# Patient Record
Sex: Female | Born: 1955 | Race: Black or African American | Hispanic: No | Smoking: Former smoker
Health system: Southern US, Community
[De-identification: ages and names within clinical notes are randomized; demographics above are authoritative.]

## PROBLEM LIST (undated history)

## (undated) DIAGNOSIS — Z95810 Presence of automatic (implantable) cardiac defibrillator: Secondary | ICD-10-CM

## (undated) DIAGNOSIS — I472 Ventricular tachycardia, unspecified: Secondary | ICD-10-CM

## (undated) DIAGNOSIS — I739 Peripheral vascular disease, unspecified: Secondary | ICD-10-CM

## (undated) DIAGNOSIS — D179 Benign lipomatous neoplasm, unspecified: Secondary | ICD-10-CM

## (undated) DIAGNOSIS — Z950 Presence of cardiac pacemaker: Secondary | ICD-10-CM

## (undated) DIAGNOSIS — I5022 Chronic systolic (congestive) heart failure: Secondary | ICD-10-CM

## (undated) DIAGNOSIS — F1910 Other psychoactive substance abuse, uncomplicated: Secondary | ICD-10-CM

## (undated) DIAGNOSIS — N183 Chronic kidney disease, stage 3 unspecified: Secondary | ICD-10-CM

## (undated) DIAGNOSIS — I447 Left bundle-branch block, unspecified: Secondary | ICD-10-CM

## (undated) DIAGNOSIS — T80219A Unspecified infection due to central venous catheter, initial encounter: Secondary | ICD-10-CM

## (undated) DIAGNOSIS — I10 Essential (primary) hypertension: Secondary | ICD-10-CM

## (undated) DIAGNOSIS — I255 Ischemic cardiomyopathy: Secondary | ICD-10-CM

## (undated) DIAGNOSIS — Z4502 Encounter for adjustment and management of automatic implantable cardiac defibrillator: Secondary | ICD-10-CM

## (undated) DIAGNOSIS — I82409 Acute embolism and thrombosis of unspecified deep veins of unspecified lower extremity: Secondary | ICD-10-CM

## (undated) DIAGNOSIS — I2510 Atherosclerotic heart disease of native coronary artery without angina pectoris: Secondary | ICD-10-CM

## (undated) HISTORY — PX: ANKLE SURGERY: SHX546

## (undated) HISTORY — PX: CARDIAC DEFIBRILLATOR PLACEMENT: SHX171

## (undated) HISTORY — PX: CARDIAC CATHETERIZATION: SHX172

## (undated) HISTORY — PX: ANKLE FRACTURE SURGERY: SHX122

---

## 1997-10-03 ENCOUNTER — Ambulatory Visit (HOSPITAL_COMMUNITY): Admission: RE | Admit: 1997-10-03 | Discharge: 1997-10-03 | Payer: Self-pay | Admitting: *Deleted

## 1998-02-28 ENCOUNTER — Inpatient Hospital Stay (HOSPITAL_COMMUNITY): Admission: AD | Admit: 1998-02-28 | Discharge: 1998-03-04 | Payer: Self-pay | Admitting: *Deleted

## 1998-06-01 ENCOUNTER — Encounter: Payer: Self-pay | Admitting: Emergency Medicine

## 1998-06-01 ENCOUNTER — Inpatient Hospital Stay (HOSPITAL_COMMUNITY): Admission: EM | Admit: 1998-06-01 | Discharge: 1998-06-05 | Payer: Self-pay | Admitting: Emergency Medicine

## 1998-06-01 ENCOUNTER — Encounter: Payer: Self-pay | Admitting: Orthopedic Surgery

## 1998-08-27 ENCOUNTER — Encounter: Admission: RE | Admit: 1998-08-27 | Discharge: 1998-11-25 | Payer: Self-pay | Admitting: Orthopedic Surgery

## 1998-12-25 ENCOUNTER — Ambulatory Visit (HOSPITAL_COMMUNITY): Admission: RE | Admit: 1998-12-25 | Discharge: 1998-12-25 | Payer: Self-pay | Admitting: Orthopedic Surgery

## 1999-08-15 ENCOUNTER — Ambulatory Visit (HOSPITAL_COMMUNITY): Admission: RE | Admit: 1999-08-15 | Discharge: 1999-08-15 | Payer: Self-pay | Admitting: *Deleted

## 1999-08-15 ENCOUNTER — Encounter: Payer: Self-pay | Admitting: *Deleted

## 1999-09-16 ENCOUNTER — Other Ambulatory Visit: Admission: RE | Admit: 1999-09-16 | Discharge: 1999-09-16 | Payer: Self-pay | Admitting: Obstetrics

## 1999-09-16 ENCOUNTER — Encounter (INDEPENDENT_AMBULATORY_CARE_PROVIDER_SITE_OTHER): Payer: Self-pay

## 2005-10-01 HISTORY — PX: CORONARY ARTERY BYPASS GRAFT: SHX141

## 2005-11-12 ENCOUNTER — Encounter: Payer: Self-pay | Admitting: Internal Medicine

## 2005-11-12 ENCOUNTER — Inpatient Hospital Stay (HOSPITAL_COMMUNITY): Admission: EM | Admit: 2005-11-12 | Discharge: 2005-11-25 | Payer: Self-pay | Admitting: Emergency Medicine

## 2005-11-13 ENCOUNTER — Encounter: Payer: Self-pay | Admitting: Internal Medicine

## 2005-11-14 ENCOUNTER — Encounter: Payer: Self-pay | Admitting: Vascular Surgery

## 2005-11-17 ENCOUNTER — Encounter: Payer: Self-pay | Admitting: Internal Medicine

## 2005-11-17 ENCOUNTER — Encounter: Payer: Self-pay | Admitting: Vascular Surgery

## 2005-11-17 ENCOUNTER — Encounter (INDEPENDENT_AMBULATORY_CARE_PROVIDER_SITE_OTHER): Payer: Self-pay | Admitting: *Deleted

## 2005-12-12 ENCOUNTER — Inpatient Hospital Stay (HOSPITAL_COMMUNITY): Admission: EM | Admit: 2005-12-12 | Discharge: 2005-12-17 | Payer: Self-pay | Admitting: Emergency Medicine

## 2005-12-15 ENCOUNTER — Encounter (INDEPENDENT_AMBULATORY_CARE_PROVIDER_SITE_OTHER): Payer: Self-pay | Admitting: *Deleted

## 2006-01-06 ENCOUNTER — Encounter: Admission: RE | Admit: 2006-01-06 | Discharge: 2006-01-06 | Payer: Self-pay | Admitting: Surgery

## 2007-03-02 ENCOUNTER — Encounter (INDEPENDENT_AMBULATORY_CARE_PROVIDER_SITE_OTHER): Payer: Self-pay | Admitting: Cardiology

## 2007-03-02 ENCOUNTER — Ambulatory Visit (HOSPITAL_COMMUNITY): Admission: RE | Admit: 2007-03-02 | Discharge: 2007-03-02 | Payer: Self-pay | Admitting: Cardiology

## 2007-03-02 ENCOUNTER — Encounter: Payer: Self-pay | Admitting: Internal Medicine

## 2007-03-04 DIAGNOSIS — I82409 Acute embolism and thrombosis of unspecified deep veins of unspecified lower extremity: Secondary | ICD-10-CM

## 2007-03-04 HISTORY — DX: Acute embolism and thrombosis of unspecified deep veins of unspecified lower extremity: I82.409

## 2007-09-15 ENCOUNTER — Encounter (INDEPENDENT_AMBULATORY_CARE_PROVIDER_SITE_OTHER): Payer: Self-pay | Admitting: Cardiology

## 2007-09-15 ENCOUNTER — Ambulatory Visit (HOSPITAL_COMMUNITY): Admission: RE | Admit: 2007-09-15 | Discharge: 2007-09-15 | Payer: Self-pay | Admitting: Cardiology

## 2007-11-03 ENCOUNTER — Emergency Department (HOSPITAL_COMMUNITY): Admission: EM | Admit: 2007-11-03 | Discharge: 2007-11-03 | Payer: Self-pay | Admitting: Emergency Medicine

## 2007-11-05 ENCOUNTER — Encounter: Payer: Self-pay | Admitting: Vascular Surgery

## 2007-11-05 ENCOUNTER — Ambulatory Visit: Payer: Self-pay | Admitting: Vascular Surgery

## 2007-11-05 ENCOUNTER — Inpatient Hospital Stay (HOSPITAL_COMMUNITY): Admission: EM | Admit: 2007-11-05 | Discharge: 2007-11-17 | Payer: Self-pay | Admitting: Emergency Medicine

## 2007-11-06 ENCOUNTER — Encounter: Payer: Self-pay | Admitting: Vascular Surgery

## 2007-11-09 ENCOUNTER — Encounter: Payer: Self-pay | Admitting: Vascular Surgery

## 2007-12-01 ENCOUNTER — Ambulatory Visit: Payer: Self-pay | Admitting: Vascular Surgery

## 2007-12-29 ENCOUNTER — Ambulatory Visit: Payer: Self-pay | Admitting: Vascular Surgery

## 2008-01-10 ENCOUNTER — Encounter: Payer: Self-pay | Admitting: Internal Medicine

## 2008-01-24 ENCOUNTER — Ambulatory Visit: Payer: Self-pay | Admitting: Vascular Surgery

## 2008-03-01 ENCOUNTER — Ambulatory Visit: Payer: Self-pay | Admitting: Vascular Surgery

## 2008-07-25 ENCOUNTER — Encounter: Admission: RE | Admit: 2008-07-25 | Discharge: 2008-07-25 | Payer: Self-pay | Admitting: Internal Medicine

## 2008-09-20 ENCOUNTER — Ambulatory Visit: Payer: Self-pay | Admitting: Vascular Surgery

## 2009-04-10 ENCOUNTER — Emergency Department (HOSPITAL_COMMUNITY): Admission: EM | Admit: 2009-04-10 | Discharge: 2009-04-10 | Payer: Self-pay | Admitting: Emergency Medicine

## 2009-09-28 ENCOUNTER — Inpatient Hospital Stay (HOSPITAL_COMMUNITY): Admission: EM | Admit: 2009-09-28 | Discharge: 2009-10-03 | Payer: Self-pay | Admitting: Emergency Medicine

## 2009-09-29 ENCOUNTER — Encounter: Payer: Self-pay | Admitting: Internal Medicine

## 2009-09-29 ENCOUNTER — Encounter (INDEPENDENT_AMBULATORY_CARE_PROVIDER_SITE_OTHER): Payer: Self-pay | Admitting: Cardiovascular Disease

## 2009-09-30 ENCOUNTER — Encounter: Payer: Self-pay | Admitting: Internal Medicine

## 2009-10-01 ENCOUNTER — Encounter (INDEPENDENT_AMBULATORY_CARE_PROVIDER_SITE_OTHER): Payer: Self-pay | Admitting: Emergency Medicine

## 2009-10-01 ENCOUNTER — Ambulatory Visit: Payer: Self-pay | Admitting: Vascular Surgery

## 2009-10-15 ENCOUNTER — Encounter: Payer: Self-pay | Admitting: Internal Medicine

## 2009-11-02 ENCOUNTER — Ambulatory Visit: Payer: Self-pay | Admitting: Internal Medicine

## 2009-11-02 DIAGNOSIS — I959 Hypotension, unspecified: Secondary | ICD-10-CM

## 2009-11-02 LAB — CONVERTED CEMR LAB
Basophils Relative: 0.5 % (ref 0.0–3.0)
CO2: 20 meq/L (ref 19–32)
Chloride: 101 meq/L (ref 96–112)
Eosinophils Absolute: 0 10*3/uL (ref 0.0–0.7)
Hemoglobin: 14.1 g/dL (ref 12.0–15.0)
INR: 1.2 — ABNORMAL HIGH (ref 0.8–1.0)
MCHC: 33 g/dL (ref 30.0–36.0)
MCV: 91.3 fL (ref 78.0–100.0)
Monocytes Absolute: 0.5 10*3/uL (ref 0.1–1.0)
Neutro Abs: 3.8 10*3/uL (ref 1.4–7.7)
RBC: 4.69 M/uL (ref 3.87–5.11)
Sodium: 134 meq/L — ABNORMAL LOW (ref 135–145)

## 2009-11-08 ENCOUNTER — Telehealth: Payer: Self-pay | Admitting: Internal Medicine

## 2009-11-20 ENCOUNTER — Encounter: Payer: Self-pay | Admitting: Internal Medicine

## 2009-11-22 ENCOUNTER — Telehealth: Payer: Self-pay | Admitting: Internal Medicine

## 2009-11-23 ENCOUNTER — Encounter: Payer: Self-pay | Admitting: Internal Medicine

## 2009-11-27 ENCOUNTER — Encounter: Payer: Self-pay | Admitting: Internal Medicine

## 2009-12-10 ENCOUNTER — Ambulatory Visit: Payer: Self-pay

## 2009-12-11 LAB — CONVERTED CEMR LAB
BUN: 32 mg/dL — ABNORMAL HIGH (ref 6–23)
Basophils Relative: 0.6 % (ref 0.0–3.0)
CO2: 25 meq/L (ref 19–32)
Chloride: 104 meq/L (ref 96–112)
Creatinine, Ser: 1.2 mg/dL (ref 0.4–1.2)
Eosinophils Absolute: 0.1 10*3/uL (ref 0.0–0.7)
HCT: 38.5 % (ref 36.0–46.0)
INR: 1.2 — ABNORMAL HIGH (ref 0.8–1.0)
Lymphs Abs: 1 10*3/uL (ref 0.7–4.0)
MCHC: 34.2 g/dL (ref 30.0–36.0)
MCV: 89.1 fL (ref 78.0–100.0)
Monocytes Absolute: 0.5 10*3/uL (ref 0.1–1.0)
Neutrophils Relative %: 71.4 % (ref 43.0–77.0)
RBC: 4.32 M/uL (ref 3.87–5.11)

## 2009-12-12 ENCOUNTER — Observation Stay (HOSPITAL_COMMUNITY): Admission: RE | Admit: 2009-12-12 | Discharge: 2009-12-13 | Payer: Self-pay | Admitting: Internal Medicine

## 2009-12-12 ENCOUNTER — Ambulatory Visit: Payer: Self-pay | Admitting: Internal Medicine

## 2010-01-10 ENCOUNTER — Encounter (HOSPITAL_COMMUNITY)
Admission: RE | Admit: 2010-01-10 | Discharge: 2010-04-02 | Payer: Self-pay | Source: Home / Self Care | Attending: Cardiovascular Disease | Admitting: Cardiovascular Disease

## 2010-01-30 ENCOUNTER — Ambulatory Visit: Payer: Self-pay | Admitting: Vascular Surgery

## 2010-03-24 ENCOUNTER — Encounter: Payer: Self-pay | Admitting: Surgery

## 2010-04-01 ENCOUNTER — Ambulatory Visit: Payer: Self-pay | Admitting: Vascular Surgery

## 2010-04-02 NOTE — Assessment & Plan Note (Signed)
Summary: icd implant/mt   Referring [name]:  DR. Roy[facility] Primary [name]:  Robin Fair, MD  CC:  New patient for evaluation for implanted ICD.   [name]  History of Present Illness: [name] is seen at the request of Dr. [facility] for consideration of ICD implantation.  He is a 55 year old with complex cardiac disease with multiple prior PCI's, CABG in 2007 with a mitral valve repair subsequent intervention in 2009 and known significant AV valvular regurgitation. She was admitted to the [facility] in August with congestive heart failure requiring inotropic support was treated with a Life Vest and plans to consider an ICD.  She also has nonsustained ventricular tachycardia. Her old chart furthermore describes atrial fibrillation with a history of a prior clot thrown to the artery in her leg requiring embolectomy.  She previously took amiodarone and Coumadin; she takes neither at this time.  She is doing much better following her recent hospitalization. She denies orthopnea nocturnal dyspnea edema and she is able to walk more than a couple regards as long as she walks slowly.  She does not have a history of prior syncope. There have been no palpitations that she recalls  Current Medications (verified): 1)  Metoprolol Tartrate 25 Mg Tabs (Metoprolol Tartrate) .... Take One Half Tablet Three Times A Day 2)  Spironolactone 25 Mg Tabs (Spironolactone) .... Take One Tablet Two Times A Day 3)  Furosemide 80 Mg Tabs (Furosemide) .... Take 1/2 Tablet Once Daily 4)  Nitrostat 0.4 Mg Subl (Nitroglycerin) .... Uad 5)  Aspirin 325 Mg Tabs (Aspirin) .... Take One Tablet Once Daily 6)  Isosorbide Mononitrate Cr 30 Mg Xr24h-Tab (Isosorbide Mononitrate) .... Take One Tablet Every Day  Allergies (verified): No Known Drug Allergies  Past History:  Family History: Last updated: 11/02/2009 Family History of Coronary Artery Disease:   Social History: Last updated:  11/02/2009 widowed Children 11, 14 Not smoking x2 months no alcohol or recreational drugs  Past Medical History: Acute right-sided heart failure Acute on chronic systolic heart failure, resolved Nonsustained ventricular tachycardia paroxysmal atrial fibrillation Ischemic cardiomyopathy with ejection fraction by 2-D echo this       admission 20-25% Mild cirrhosis of the liver Coronary artery disease with history of bypass grafting in 2007       along with mitral valve annuloplasty Moderate-to-severe mitral regurgitation Moderate-to-severe tricuspid regurgitation Borderline blood pressure in the 90s to low 100s.  Currently stable       on these readings.  Non-insulin-dependent diabetes mellitus, diet controlled.  Hypertension, controlled.  History of peripheral vascular disease.  History of noncompliance secondary to financial issues.  Tobacco use  Past Surgical History: right popliteal and tibial artery embolectomy-2009 bypass surgery  Family History: Family History of Coronary Artery Disease:   Social History: widowed Children 11, 71 Not smoking x2 months no alcohol or recreational drugs  Review of Systems       full review of systems was negative apart from a history of present illness and past medical history.   Vital Signs:  Patient profile:   55 year old female Height:      64 inches Weight:      157 pounds BMI:     27.05 Pulse rate:   63 / minute Pulse rhythm:   regular BP sitting:   96 / 68  (left arm) Cuff size:   regular  Vitals Entered By: [name] CMA (November 02, 2009 10:54 AM)   Physical Exam  General:  Well developed, well nourished,middle-aged African American female appearing her stated age in no acute distress.She is wearing a life vest Head:  normal HEENT Neck:  supple without thyromegaly carotids diminished but brisk JVP with V waves  9-10 cm Chest Wall:  without kyphosis scoliosis or CVA tenderness Lungs:  clear to  auscultation Heart:  displaced PMI with a diminished S1 no significant murmurs and no S3 was appreciated Abdomen:  soft nontender without hepatomegaly. positive HJR Msk:  normal gait and without obvious arthropathy Pulses:  intact distal pulses Extremities:  no clubbing cyanosis or edema with a scar on the medial aspect of her right lower leg Neurologic:  alert and oriented with grossly normal motor and sensory function Skin:  warm and dry Cervical Nodes:  without adenopathy Psych:  pleasant and engaging affect   EKG  Procedure date:  11/02/2009  Findings:      sinus rh Intervals 0.19/0.12/0.45 Axis is -45 T Wave inversions anterolaterally consistent with ischemia Nonspecific IVCDythm at 63  Impression & Recommendations:  Problem # 1:  CARDIOMYOPATHY, ISCHEMIC S/P CABG (ICD-414.8) the patient has chronic ischemic cardiomyopathy. He has hypotension limiting some of titration of her drugs. She is currently wearing a life vest. She is a reasonable candidate for ICD implantation for primary prevention of sudden cardiac death. I reviewed with her the potential benefits and risks of this procedure.  Problem # 2:  ATRIAL FIBRILLATION (ICD-427.31)  patient has atrial fibrillation I. history. She also has a history of an embolism. I will need to check with Dr. Erlinda Hong as to why is that she is no longer on anticoagulation. Her updated medication list for this problem includes:    Metoprolol Tartrate 25 Mg Tabs (Metoprolol tartrate) .[name]... Take one half tablet three times a day    Aspirin 325 Mg Tabs (Aspirin) .[name]... Take one tablet once daily  Her updated medication list for this problem includes:    Metoprolol Tartrate 25 Mg Tabs (Metoprolol tartrate) .[name]... Take one half tablet three times a day    Aspirin 325 Mg Tabs (Aspirin) .[name]... Take one tablet once daily  Orders: EKG w/ Interpretation (93000)  Problem # 3:  SYSTOLIC HEART FAILURE, CHRONIC (ICD-428.22)  she has significant symptoms.  I will talk with Dr. Erlinda Hong as to whether a CHF consultation might be helpful. Modification of her medical regime may be helpful also; the records are not clear as to why she is not on an ACE inhibitor. Her updated medication list for this problem includes:    Metoprolol Tartrate 25 Mg Tabs (Metoprolol tartrate) .[name]... Take one half tablet three times a day    Spironolactone 25 Mg Tabs (Spironolactone) .[name]... Take one tablet two times a day    Furosemide 80 Mg Tabs (Furosemide) .[name]... Take 1/2 tablet once daily    Nitrostat 0.4 Mg Subl (Nitroglycerin) ..... Uad    Aspirin 325 Mg Tabs (Aspirin) .[name]... Take one tablet once daily    Isosorbide Mononitrate Cr 30 Mg Xr24h-tab (Isosorbide mononitrate) .[name]... Take one tablet every day  Her updated medication list for this problem includes:    Metoprolol Tartrate 25 Mg Tabs (Metoprolol tartrate) .[name]... Take one half tablet three times a day    Spironolactone 25 Mg Tabs (Spironolactone) .[name]... Take one tablet two times a day    Furosemide 80 Mg Tabs (Furosemide) .[name]... Take 1/2 tablet once daily    Nitrostat 0.4 Mg Subl (Nitroglycerin) ..... Uad    Aspirin 325 Mg Tabs (Aspirin) .[name]... Take one tablet  once daily    Isosorbide Mononitrate Cr 30 Mg Xr24h-tab (Isosorbide mononitrate) .[name]... Take one tablet every day  Problem # 4:  HYPOTENSION (ICD-458.9) this has been an issue  Problem # 5:  EMBOLISM AND THROMBOSIS, ARTERY STATUS POST EMBOLIECTOMY (ICD-444.9) as a  Other Orders: TLB-BMP (Basic Metabolic Panel-BMET) (80048-METABOL) TLB-CBC Platelet - w/Differential (85025-CBCD) TLB-PTT (85730-PTTL) TLB-PT (Protime) (85610-PTP)

## 2010-04-02 NOTE — Op Note (Signed)
Summary: [facility]  [facility]   Imported By: Marylou Mccoy 01/22/2010 16:25:40  _____________________________________________________________________  External Attachment:    Type:   Image     Comment:   External Document

## 2010-04-02 NOTE — Cardiovascular Report (Signed)
Summary: Pre-Op Orders  Pre-Op Orders   Imported By: Marylou Mccoy 11/21/2009 11:25:55  _____________________________________________________________________  External Attachment:    Type:   Image     Comment:   External Document

## 2010-04-02 NOTE — Letter (Signed)
Summary: [facility] Office Note   [facility] Office Note   Imported By: Roderic Ovens 12/12/2009 10:13:10  _____________________________________________________________________  External Attachment:    Type:   Image     Comment:   External Document

## 2010-04-02 NOTE — Letter (Signed)
Summary: [facility] Visit   [facility] Visit   Imported By: Roderic Ovens 12/12/2009 10:15:49  _____________________________________________________________________  External Attachment:    Type:   Image     Comment:   External Document

## 2010-04-02 NOTE — Letter (Signed)
Summary: [facility]  [facility]   Imported By: Marylou Mccoy 01/22/2010 16:22:59  _____________________________________________________________________  External Attachment:    Type:   Image     Comment:   External Document

## 2010-04-02 NOTE — Cardiovascular Report (Signed)
Summary: [facility]  [facility]   Imported By: Marylou Mccoy 01/22/2010 16:20:27  _____________________________________________________________________  External Attachment:    Type:   Image     Comment:   External Document

## 2010-04-02 NOTE — Progress Notes (Signed)
Summary: re icd placement date  Phone Note From Other Clinic   Caller: kay [phone] ext [phone]  [facility] Summary of Call: kay calling to see if icd placement has been rs? Initial call taken by: Glynda Jaeger,  November 22, 2009 4:35 PM  Follow-up for Phone Call        don't see where a myoview was done or scheduled. office closed at return number. either I can discuss on Monday or msg nurse can schedule something on Friday. thanks Follow-up by: Claris Gladden RN,  November 22, 2009 7:15 PM  Additional Follow-up for Phone Call Additional follow up Details #1::        called pt   had myoview done. coulld you schedule for our October day thankns steve Additional Follow-up by: Nathen May, MD, [facility],  November 22, 2009 9:08 PM     Appended Document: re icd placement date SCHEDULED FOR 12/12/09 @ 12:00PM. LABS TO BE DRAWN ON 10/10. REVIEWED WITH PT & LETTER SENT.

## 2010-04-02 NOTE — Letter (Signed)
Summary: Implantable Device Instructions  Architectural technologist, Main Office  [address]   [place], [place] [address]   Phone: [phone]  Fax: [phone]      Implantable Device Instructions  You are scheduled for:  _____ Permanent Transvenous Pacemaker __x___ Implantable Cardioverter Defibrillator _____ Implantable Loop Recorder _____ Generator Change  on _9/6/11____ with Dr. __ALLRED___. _ 1.  Please arrive at the Short Stay Center at [facility] at __1:00 PM__ on the day of your procedure.  2.  Do not eat or drink the night before your procedure.  3.  CompleteD lab work on _9/2/11____.  The lab at [facility] is open from 8:30 AM to 1:30 PM and from 2:30 PM to 5:00 PM.  The lab at [facility] is open from 7:30 AM to 5:30 PM.  You do not have to be fasting.  4.  Do NOT take these medications HOLD FUROSEMIDE AFTERNOON OF PROCEDURE.  5.  Plan for an overnight stay.  Bring your insurance cards and a list of your medications.  6.  Wash your chest and neck with antibacterial soap (any brand) the evening before and the morning of your procedure.  Rinse well.  7.  Education material received:     Pacemaker _____           ICD __X___           Arrhythmia _____  *If you have ANY questions after you get home, please call the office [phone].  *Every attempt is made to prevent procedures from being rescheduled.  Due to the nauture of Electrophysiology, rescheduling can happen.  The physician is always aware and directs the staff when this occurs.    Appended Document: Implantable Device Instructions per Dr Juliann Pares ICD implantation for 11/06/09--pt aware

## 2010-04-02 NOTE — Cardiovascular Report (Signed)
Summary: Pre Op Orders   Pre Op Orders   Imported By: Roderic Ovens 12/12/2009 15:58:01  _____________________________________________________________________  External Attachment:    Type:   Image     Comment:   External Document

## 2010-04-02 NOTE — Letter (Signed)
Summary: Implantable Device Instructions  Architectural technologist, Main Office  [address]   [place], [place] [address]   Phone: [phone]  Fax: [phone]      Implantable Device Instructions Rev-1 11/27/09  You are scheduled for:   __x___ Implantable Cardioverter Defibrillator   on December 12, 2009 at 12:00 pm  with Dr. Graciela Husbands.  1.  Please arrive at the Short Stay Center at [facility] at 10:00 am on the day of your procedure.  2.  Do not eat or drink after 6:00 am the morning of your procedure. You can have a clear breakfast before that time which can include: water, broth, Sprite, Ginger Ale, black coffee, tea (no sugar), cranberry/grape/apple juice, jello, popsicle from clear juices. Take your morning pills, except Furosemide, with one of these clear liquids.   3.  Complete lab work on December 10, 2009 at 10:30 am.  The lab at [address] is open from 8:30 AM to 1:30 PM and from 2:30 PM to 5:00 PM.  The lab at [facility] is open from 7:30 AM to 5:30 PM.  You do not have to be fasting.  4.  Do NOT take Furosemide the morning of your procedure.   5.  Plan for an overnight stay.  Bring your insurance cards and a list of your medications.  6.  Wash your chest and neck with antibacterial soap (any brand) the evening before and the morning of your procedure.  Rinse well.   *If you have ANY questions after you get home, please call the office [phone]. Claris Gladden, RN, BSN  *Every attempt is made to prevent procedures from being rescheduled.  Due to the nauture of Electrophysiology, rescheduling can happen.  The physician is always aware and directs the staff when this occurs.

## 2010-04-02 NOTE — Letter (Signed)
Summary: Implantable Device Instructions  Architectural technologist, Main Office  [address]   [place], [place] [address]   Phone: [phone]  Fax: [phone]      Implantable Device Instructions  You are scheduled for:  _____ Permanent Transvenous Pacemaker ___X__ Implantable Cardioverter Defibrillator _____ Implantable Loop Recorder _____ Generator Change  on 12/12/09  with Dr. Graciela Husbands @ 12:00 pm.  1.  Please arrive at the Short Stay Center at [facility] at  10:00 am on the day of your procedure.  2.  Do not eat or drink the night before your procedure.  3.  Complete lab work on Monday  Oct.10th, 2011.  The lab at [facility] is open from 8:30 AM to 1:30 PM and from 2:30 PM to 5:00 PM.  The lab at [facility] is open from 7:30 AM to 5:30 PM.  You do not have to be fasting.  4.  Plan for an overnight stay.  Bring your insurance cards and a list of your medications.  5.  Wash your chest and neck with antibacterial soap (any brand) the evening before and the morning of your procedure.  Rinse well.  6.  Education material received:     Pacemaker _____           ICD _X____           Arrhythmia _____  *If you have ANY questions after you get home, please call the office [phone].  *Every attempt is made to prevent procedures from being rescheduled.  Due to the nauture of Electrophysiology, rescheduling can happen.  The physician is always aware and directs the staff when this occurs.

## 2010-04-02 NOTE — Progress Notes (Signed)
  Phone Note From Other Clinic   Caller: Nurse Call For: nurse/dr Tarvis Blossom Summary of Call: DR CROITORU 'S OFFICE CALLED RE PT DEVICE IMPLANT BEING CX  PER DR Graciela Husbands PT NEEDS MYOVIEW PRIOR TO PROCEDURE TO CHECK ON ISCHEMIA DR CROITORU'S OFF TO SCHEDULE. Initial call taken by: Scherrie Bateman, LPN,  November 08, 2009 11:45 AM  Follow-up for Phone Call        thnsk Follow-up by: Nathen May, MD, [facility],  November 19, 2009 5:40 PM

## 2010-04-02 NOTE — Cardiovascular Report (Signed)
Summary: [facility]  [facility]   Imported By: Marylou Mccoy 01/22/2010 16:22:14  _____________________________________________________________________  External Attachment:    Type:   Image     Comment:   External Document

## 2010-05-16 LAB — CBC
HCT: 35.6 % — ABNORMAL LOW (ref 36.0–46.0)
Hemoglobin: 12 g/dL (ref 12.0–15.0)
MCH: 29.7 pg (ref 26.0–34.0)
MCV: 88.1 fL (ref 78.0–100.0)
RBC: 4.04 MIL/uL (ref 3.87–5.11)
WBC: 5.5 10*3/uL (ref 4.0–10.5)

## 2010-05-16 LAB — BASIC METABOLIC PANEL
CO2: 27 mEq/L (ref 19–32)
Chloride: 103 mEq/L (ref 96–112)
Creatinine, Ser: 1.26 mg/dL — ABNORMAL HIGH (ref 0.4–1.2)
GFR calc Af Amer: 54 mL/min — ABNORMAL LOW (ref >60)
Potassium: 3.8 mEq/L (ref 3.5–5.1)
Sodium: 139 mEq/L (ref 135–145)

## 2010-05-16 LAB — GLUCOSE, CAPILLARY: Glucose-Capillary: 149 mg/dL — ABNORMAL HIGH (ref 70–99)

## 2010-05-16 LAB — APTT: aPTT: 32 seconds (ref 24–37)

## 2010-05-16 LAB — SURGICAL PCR SCREEN
MRSA, PCR: NEGATIVE
Staphylococcus aureus: NEGATIVE

## 2010-05-17 LAB — GLUCOSE, CAPILLARY
Glucose-Capillary: 104 mg/dL — ABNORMAL HIGH (ref 70–99)
Glucose-Capillary: 105 mg/dL — ABNORMAL HIGH (ref 70–99)
Glucose-Capillary: 108 mg/dL — ABNORMAL HIGH (ref 70–99)
Glucose-Capillary: 114 mg/dL — ABNORMAL HIGH (ref 70–99)
Glucose-Capillary: 119 mg/dL — ABNORMAL HIGH (ref 70–99)
Glucose-Capillary: 133 mg/dL — ABNORMAL HIGH (ref 70–99)
Glucose-Capillary: 78 mg/dL (ref 70–99)
Glucose-Capillary: 92 mg/dL (ref 70–99)

## 2010-05-17 LAB — COMPREHENSIVE METABOLIC PANEL
ALT: 19 U/L (ref 0–35)
Albumin: 3.8 g/dL (ref 3.5–5.2)
Alkaline Phosphatase: 350 U/L — ABNORMAL HIGH (ref 39–117)
BUN: 21 mg/dL (ref 6–23)
Calcium: 9.4 mg/dL (ref 8.4–10.5)
Glucose, Bld: 93 mg/dL (ref 70–99)
Potassium: 3.6 mEq/L (ref 3.5–5.1)
Sodium: 136 mEq/L (ref 135–145)
Total Protein: 7.8 g/dL (ref 6.0–8.3)

## 2010-05-17 LAB — BASIC METABOLIC PANEL
CO2: 28 mEq/L (ref 19–32)
Calcium: 10 mg/dL (ref 8.4–10.5)
Calcium: 9.7 mg/dL (ref 8.4–10.5)
Chloride: 100 mEq/L (ref 96–112)
Chloride: 100 mEq/L (ref 96–112)
Creatinine, Ser: 1.21 mg/dL — ABNORMAL HIGH (ref 0.4–1.2)
GFR calc Af Amer: 46 mL/min — ABNORMAL LOW (ref >60)
GFR calc Af Amer: 57 mL/min — ABNORMAL LOW (ref >60)
GFR calc non Af Amer: 47 mL/min — ABNORMAL LOW (ref >60)
Glucose, Bld: 98 mg/dL (ref 70–99)
Potassium: 4.2 mEq/L (ref 3.5–5.1)
Sodium: 137 mEq/L (ref 135–145)

## 2010-05-17 LAB — BRAIN NATRIURETIC PEPTIDE: Pro B Natriuretic peptide (BNP): 757 pg/mL — ABNORMAL HIGH (ref 0.0–100.0)

## 2010-05-17 LAB — MAGNESIUM: Magnesium: 2.1 mg/dL (ref 1.5–2.5)

## 2010-05-17 LAB — CBC
MCHC: 33.4 g/dL (ref 30.0–36.0)
Platelets: 158 10*3/uL (ref 150–400)
RDW: 18.3 % — ABNORMAL HIGH (ref 11.5–15.5)
WBC: 4.5 10*3/uL (ref 4.0–10.5)

## 2010-05-18 LAB — URINALYSIS, ROUTINE W REFLEX MICROSCOPIC
Hgb urine dipstick: NEGATIVE
Ketones, ur: NEGATIVE mg/dL
Nitrite: NEGATIVE
Protein, ur: 100 mg/dL — AB
Urobilinogen, UA: 1 mg/dL (ref 0.0–1.0)

## 2010-05-18 LAB — COMPREHENSIVE METABOLIC PANEL
AST: 31 U/L (ref 0–37)
Albumin: 3.7 g/dL (ref 3.5–5.2)
Alkaline Phosphatase: 333 U/L — ABNORMAL HIGH (ref 39–117)
BUN: 13 mg/dL (ref 6–23)
CO2: 20 mEq/L (ref 19–32)
Chloride: 106 mEq/L (ref 96–112)
Creatinine, Ser: 1.35 mg/dL — ABNORMAL HIGH (ref 0.4–1.2)
GFR calc non Af Amer: 41 mL/min — ABNORMAL LOW (ref >60)
Potassium: 4 mEq/L (ref 3.5–5.1)
Total Bilirubin: 1.7 mg/dL — ABNORMAL HIGH (ref 0.3–1.2)

## 2010-05-18 LAB — GLUCOSE, CAPILLARY
Glucose-Capillary: 104 mg/dL — ABNORMAL HIGH (ref 70–99)
Glucose-Capillary: 136 mg/dL — ABNORMAL HIGH (ref 70–99)
Glucose-Capillary: 148 mg/dL — ABNORMAL HIGH (ref 70–99)
Glucose-Capillary: 179 mg/dL — ABNORMAL HIGH (ref 70–99)

## 2010-05-18 LAB — CK TOTAL AND CKMB (NOT AT ARMC)
CK, MB: 1.2 ng/mL (ref 0.3–4.0)
Relative Index: INVALID (ref 0.0–2.5)
Total CK: 68 U/L (ref 7–177)

## 2010-05-18 LAB — HEMOGLOBIN A1C
Hgb A1c MFr Bld: 6.1 % — ABNORMAL HIGH (ref <5.7)
Mean Plasma Glucose: 128 mg/dL — ABNORMAL HIGH (ref <117)

## 2010-05-18 LAB — CARDIAC PANEL(CRET KIN+CKTOT+MB+TROPI)
CK, MB: 0.9 ng/mL (ref 0.3–4.0)
CK, MB: 0.9 ng/mL (ref 0.3–4.0)
Relative Index: INVALID (ref 0.0–2.5)
Relative Index: INVALID (ref 0.0–2.5)
Total CK: 62 U/L (ref 7–177)
Troponin I: 0.04 ng/mL (ref 0.00–0.06)
Troponin I: 0.05 ng/mL (ref 0.00–0.06)

## 2010-05-18 LAB — DIFFERENTIAL
Basophils Absolute: 0 10*3/uL (ref 0.0–0.1)
Basophils Relative: 0 % (ref 0–1)
Eosinophils Relative: 1 % (ref 0–5)
Monocytes Absolute: 0.4 10*3/uL (ref 0.1–1.0)

## 2010-05-18 LAB — CBC
Hemoglobin: 15.3 g/dL — ABNORMAL HIGH (ref 12.0–15.0)
MCH: 31 pg (ref 26.0–34.0)
MCV: 92.3 fL (ref 78.0–100.0)
Platelets: 159 10*3/uL (ref 150–400)
Platelets: 172 10*3/uL (ref 150–400)
RBC: 4.94 MIL/uL (ref 3.87–5.11)
RDW: 18 % — ABNORMAL HIGH (ref 11.5–15.5)
WBC: 4.6 10*3/uL (ref 4.0–10.5)
WBC: 5.5 10*3/uL (ref 4.0–10.5)

## 2010-05-18 LAB — BASIC METABOLIC PANEL
BUN: 12 mg/dL (ref 6–23)
BUN: 17 mg/dL (ref 6–23)
CO2: 29 mEq/L (ref 19–32)
Chloride: 107 mEq/L (ref 96–112)
GFR calc Af Amer: >60 mL/min (ref >60)
GFR calc non Af Amer: 43 mL/min — ABNORMAL LOW (ref >60)
Glucose, Bld: 95 mg/dL (ref 70–99)
Potassium: 3.5 mEq/L (ref 3.5–5.1)
Potassium: 3.9 mEq/L (ref 3.5–5.1)
Sodium: 140 mEq/L (ref 135–145)

## 2010-05-18 LAB — PROTIME-INR
INR: 1.51 — ABNORMAL HIGH (ref 0.00–1.49)
Prothrombin Time: 18.1 seconds — ABNORMAL HIGH (ref 11.6–15.2)

## 2010-05-18 LAB — MAGNESIUM: Magnesium: 2 mg/dL (ref 1.5–2.5)

## 2010-05-18 LAB — LIPASE, BLOOD: Lipase: 54 U/L (ref 11–59)

## 2010-05-18 LAB — TROPONIN I: Troponin I: 0.04 ng/mL (ref 0.00–0.06)

## 2010-05-18 LAB — LIPID PANEL
Cholesterol: 150 mg/dL (ref 0–200)
LDL Cholesterol: 100 mg/dL — ABNORMAL HIGH (ref 0–99)

## 2010-05-18 LAB — HEPATIC FUNCTION PANEL
AST: 34 U/L (ref 0–37)
Albumin: 3.6 g/dL (ref 3.5–5.2)
Total Protein: 7.2 g/dL (ref 6.0–8.3)

## 2010-05-18 LAB — HEPATITIS PANEL, ACUTE: Hep A IgM: NEGATIVE

## 2010-05-18 LAB — AFP TUMOR MARKER: AFP-Tumor Marker: 2 ng/mL (ref 0.0–8.0)

## 2010-05-18 LAB — URINE MICROSCOPIC-ADD ON

## 2010-05-22 LAB — URINALYSIS, ROUTINE W REFLEX MICROSCOPIC
Ketones, ur: 15 mg/dL — AB
Nitrite: NEGATIVE
Protein, ur: 100 mg/dL — AB
Urobilinogen, UA: 1 mg/dL (ref 0.0–1.0)
pH: 5.5 (ref 5.0–8.0)

## 2010-05-22 LAB — BASIC METABOLIC PANEL
BUN: 19 mg/dL (ref 6–23)
CO2: 20 mEq/L (ref 19–32)
Chloride: 105 mEq/L (ref 96–112)
Creatinine, Ser: 1.37 mg/dL — ABNORMAL HIGH (ref 0.4–1.2)
Glucose, Bld: 120 mg/dL — ABNORMAL HIGH (ref 70–99)
Potassium: 4.3 mEq/L (ref 3.5–5.1)

## 2010-05-22 LAB — CBC
HCT: 40.9 % (ref 36.0–46.0)
MCHC: 33.2 g/dL (ref 30.0–36.0)
MCV: 90.1 fL (ref 78.0–100.0)
Platelets: 157 10*3/uL (ref 150–400)
RDW: 18.1 % — ABNORMAL HIGH (ref 11.5–15.5)
WBC: 4.4 10*3/uL (ref 4.0–10.5)

## 2010-05-22 LAB — URINE MICROSCOPIC-ADD ON

## 2010-05-22 LAB — DIFFERENTIAL
Basophils Absolute: 0 10*3/uL (ref 0.0–0.1)
Lymphocytes Relative: 16 % (ref 12–46)
Monocytes Absolute: 0.4 10*3/uL (ref 0.1–1.0)
Neutro Abs: 3.3 10*3/uL (ref 1.7–7.7)
Neutrophils Relative %: 75 % (ref 43–77)

## 2010-07-16 NOTE — Assessment & Plan Note (Signed)
OFFICE VISIT   [name], [name]  DOB:  1955/10/28                                       12/29/2007  [id number]#:[id number]   The patient returns for followup today of her fasciotomy wounds.  She  previously underwent right popliteal and tibial artery embolectomy on  September 4.   PHYSICAL EXAMINATION:  On physical exam today she has a 2+ dorsalis  pedis pulse in the right foot.  The lateral fasciotomy wound has some  hypergranulation tissue but overall is decreasing in size.  She had a  small amount of hypergranulation tissue on the medial aspect but this is  essentially healed.   She will continue to have hydrogel dressings on the lateral fasciotomy  site.  She will return for followup in one month's time.   Janetta Hora. [name], MD  Electronically Signed   CEF/MEDQ  D:  12/30/2007  T:  12/30/2007  Job:  [id number]

## 2010-07-16 NOTE — Assessment & Plan Note (Signed)
OFFICE VISIT   [name], [name]  DOB:  1956-02-20                                       03/01/2008  [id number]#:[id number]   The patient returns for follow-up today after right popliteal and tibial  artery embolectomy on September 4th.  On exam today, she is a 2+  dorsalis pedis pulse in the right foot.  The lateral fasciotomy wound is  now completely healed.  The medial leg wound is also well-healed.  Overall, she is doing well.  She continues to take her Coumadin and  Plavix.  She has close follow-up with Dr. Jacinto Halim in the near future.  She  will follow up with me in 6 months' time for repeat ABIs.   Janetta Hora. [name], MD  Electronically Signed   CEF/MEDQ  D:  03/01/2008  T:  03/02/2008  Job:  [id number]   cc:   Cristy Hilts. Jacinto Halim, MD

## 2010-07-16 NOTE — Discharge Summary (Signed)
[name], [name]              ACCOUNT NO.:  [id number]   MEDICAL RECORD NO.:  [id number]          PATIENT TYPE:  INP   LOCATION:  2008                         FACILITY:  [facility]   PHYSICIAN:  Janetta Hora. [name], MD  DATE OF BIRTH:  01-02-1956   DATE OF ADMISSION:  11/05/2007  DATE OF DISCHARGE:  11/17/2007                               DISCHARGE SUMMARY   The patient is of Dr. Darrick Penna.   DISCHARGE DIAGNOSES:  1. Right lower extremity ischemia, acute.  2. Atrial fibrillation.  3. Congestive heart failure.  4. Diabetes.  5. Hypertension.  6. Dyslipidemia.   PROCEDURE PERFORMED:  1. On November 05, 2007, right popliteal and tibial artery      embolectomy.  2. Four compartment fasciotomy.  3. Intraoperative arteriogram by Dr. Darrick Penna.   COMPLICATIONS:  None.   CONDITION ON DISCHARGE:  Stable and improving.   DISPOSITION:  She is being discharged home in stable condition with her  wounds healing well.  She does have a wound VAC in place and  arrangements have been arranged for home health followup.  She is also  discharged on Coumadin and arrangements have been made for followup with  Dr. Jacinto Halim of [facility].   DISPOSITION:  She is being discharged to home in stable condition.  Her  wounds are healing well.  She is given careful instructions regarding  the care of her wounds.  As previously stated, home health nursing is  arranged for followup with wound VAC and blood draws with Dr. Jacinto Halim.  She is given the following appointments, Dr. Darrick Penna 1-2 weeks, Dr. Jacinto Halim  in 1 week.  She is to call Dr. Verl Dicker office to schedule an  appointment.  Our office will schedule an appointment with Dr. Darrick Penna.  Brief identifying statement with complete details, please refer the  typed history and physical.   Briefly, this is a very pleasant 55 year old woman presented to the  emergency room with a painful right lower extremity.  Dr. Darrick Penna  evaluated her and found her to have a right lower  extremity ischemia.  This was felt due to an embolic phenomenon and he recommended  embolectomy and fasciotomy if needed.  She was informed of the risks and  benefits of the procedure and after careful consideration elected to  proceed with surgery.   HOSPITAL COURSE:  Preoperative workup was completed on an urgent basis.  She was taken to the operating room and underwent the aforementioned  embolectomy.  For complete details, please refer to the typed operative  report.  The procedure was without complications.  She was returned to  the post-anesthesia care unit extubated.  We were able to transfer her  to a bed on a surgical convalescent floor.  She was mobilized.  A wound  VAC was placed.  She developed congestive heart failure postoperatively.  We asked Dr. Jacinto Halim to evaluate this.  We do appreciate his diligent  assistance.  Over the course of several days, her congestive heart  failure improved.  The wound was forming granulation tissue.  She was  felt stable.  She was desirous of  discharge on November 17, 2007, and  was subsequently discharged home.  At discharge, her PT/INR was 27.5 and  2.4.   DISCHARGE MEDICATIONS:  1. Coreg 3.125 mg p.o. b.i.d.  2. Lipitor 40 mg p.o. daily.  3. Plavix 75 mg p.o. daily.  4. Metformin 500 mg p.o. b.i.d.  5. Lisinopril/hydrochlorothiazide 10/12.5 mg tablets one p.o. daily.  6. Darvocet-N 100 one p.o. q.6 h. p.r.n. pain.  7. Amiodarone 200 mg p.o. daily.  8. Digoxin 0.125 mg p.o. daily.  9. Aldactone 25 mg one-half tablet p.o. b.i.d.  10.Lasix 40 mg p.o. b.i.d.  11.K-Dur 20 mEq p.o. daily.  12.Oxycodone 5 mg 1-2 p.o. q.4 h. p.r.n. pain.  13.She was given a total of 30 tablets of Percocet.      Wilmon Arms, PA      Janetta Hora. [name], MD  Electronically Signed    KEL/MEDQ  D:  11/17/2007  T:  11/17/2007  Job:  [id number]

## 2010-07-16 NOTE — Op Note (Signed)
[name], [name]              ACCOUNT NO.:  [id number]   MEDICAL RECORD NO.:  [id number]          PATIENT TYPE:  INP   LOCATION:  2314                         FACILITY:  [facility]   PHYSICIAN:  Janetta Hora. [name], MD  DATE OF BIRTH:  1955/06/13   DATE OF PROCEDURE:  11/05/2007  DATE OF DISCHARGE:                               OPERATIVE REPORT   PROCEDURES:  1. Right popliteal and tibial artery embolectomy.  2. Four compartment fasciotomy.  3. Intraoperative arteriogram x1.   PREOPERATIVE DIAGNOSIS:  Ischemia, right lower extremity.   POSTOPERATIVE DIAGNOSIS:  Ischemia, right lower extremity.   ANESTHESIA:  General.   ASSISTANT:  Jerold Coombe, PA-C   OPERATIVE FINDINGS:  1. Acute embolus, right popliteal and tibial arteries.  2. Intraoperative arteriogram with 3-vessel runoff at the end of the      case.   OPERATIVE DETAILS:  After obtaining informed consent, the patient was  taken to the operating room.  The patient was placed in supine position  on the operating room table.  After induction of general anesthesia and  endotracheal intubation, a Foley catheter was placed.  Next, the  patient's entire right lower extremity was prepped and draped in usual  sterile fashion.  A longitudinal incision was made on the medial aspect  of the right leg below the knee.  Incision was carried down through  subcutaneous tissues down to the level of the fascia.  The fascia was  opened with cautery.  Popliteal space was entered.  Popliteal artery was  dissected free circumferentially.  There was visible thrombus within the  artery.  Dissection was carried down to the level of the takeoff of the  anterior tibial artery and tibioperoneal trunks.  Anterior tibial vein  was divided and ligated between silk ties.  Anterior tibial artery was  dissected free circumferentially and a vessel loop was placed around  this.  A vessel loop was also placed around the proximal tibioperoneal  trunk.   The patient was given 7000 units of intravenous heparin.  A  transverse arteriotomy was made in the popliteal artery just above the  takeoff of the tibial vessels.  There was thrombus visible within the  artery.  A #4-Fogarty catheter was used to thrombectomize the popliteal  artery and multiple passes were made until all thrombotic material was  removed.  There was excellent arterial inflow.  This was then occluded  with a vessel loop.  Next, #2, #3, and #4 Fogarty catheters were passed  down the tibial arteries with return of a large amount of thrombus and  there was very brisk backbleeding from all of these at this point.  Catheters were passed down, 2 clean passes were made in each vessel.  These were then controlled with fine bulldog clamps distally.  Everything was thoroughly irrigated heparinized saline.  The arteriotomy  was then reapproximated using interrupted 7-0 Prolene sutures.  Just  prior to completion of the anastomosis, this was forebled, backbled, and  thoroughly flushed.  Anastomosis was secured.  Clamps were released.  There is pulsatile flow in the popliteal artery and Doppler  signals in  the anterior tibial and tibioperoneal trunk immediately.  The patient  also had re-establishment of a dorsalis pedis and posterior tibial  Doppler signal.  Intraoperative arteriogram was then performed using  inflow occlusion.  This showed a patent popliteal artery with 3-vessel  runoff via the anterior tibial peroneal and posterior tibial arteries.  There was some irregularity of the mid section of the tibioperoneal  trunk and this was thought to be primarily due to spasm.  There was also  some diffuse spasm distally down towards the foot.  All of these  vessels, however were patent.   Next, the medial leg incision was extended, so that the posterior and  posterior deep compartments were fully decompressed.  An anterior and  lateral compartment fasciotomy was then performed to a  longitudinal  incision on the lateral aspect of the right leg.  The patient tolerated  the procedure well and there were no complications.  Instrument, sponge,  and needle counts were correct at the end of the case.  The patient was  taken to recovery room in stable condition.      Janetta Hora. [name], MD  Electronically Signed     CEF/MEDQ  D:  11/05/2007  T:  11/06/2007  Job:  [id number]

## 2010-07-16 NOTE — Assessment & Plan Note (Signed)
OFFICE VISIT   NYARA, CAPELL  DOB:  03-27-1955                                       12/01/2007  EAVWU#:[id number]   The patient returns for followup today.  She underwent right popliteal  and tibial artery embolectomy on September 4.  She also required  fasciotomies at that time.  She was placed on Coumadin on discharge.  She has a history of low ejection fraction and is followed for her  cardiology issues by Dr. Jacinto Halim.  She presents for further followup  today.   PHYSICAL EXAMINATION:  On exam today the medial and lateral fasciotomy  wounds are granulating well and these are clean.  She has a VAC at home  and will have this reapplied tomorrow.  She has a 2+ dorsalis pedis and  posterior tibial pulse in the right foot.  She complains that she still  has some numbness, tingling, pins and needles especially on the front  and dorsal aspect of her foot.  She is currently taking Lortab for pain.   In summary, the patient has recovered from her embolectomy.  She has a  well-perfused right foot.  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  I discussed with her today these may not  recover but hopefully over time she will get some sensation back.  Since  she is having a fair amount of neuropathic pain I have started her on  Neurontin 100 mg three times a day today.  I also gave her a renewal of  oxycodone prescription 5 mg #40 dispensed.  She will follow up with me  in 1 month's time to recheck her wounds and also to see if her Neurontin  dose may need to be titrated.  She will continue to take her Coumadin as  directed by Dr. Jacinto Halim.  She will also continue to refrain from use of  crack cocaine.   Janetta Hora. [name], MD  Electronically Signed   CEF/MEDQ  D:  12/01/2007  T:  12/02/2007  Job:  [id number]   cc:   Cristy Hilts. Jacinto Halim, MD

## 2010-07-19 NOTE — Cardiovascular Report (Signed)
NAMEABBEGAIL, MATUSKA NO.:  [id number]   MEDICAL RECORD NO.:  [id number]          PATIENT TYPE:  INP   LOCATION:  2313                         FACILITY:  [facility]   PHYSICIAN:  Nicki Guadalajara, M.D.     DATE OF BIRTH:  1956/02/27   DATE OF PROCEDURE:  11/13/2005  DATE OF DISCHARGE:                              CARDIAC CATHETERIZATION   INDICATIONS:  [name] is a 55 year old African-American female  with a history of cocaine use who presented to [facility] yesterday  morning, November 12, 2005 with an acute anterior wall myocardial  infarction associated with cocaine use.  She underwent emergent cardiac  catheterization and coronary intervention by Dr. Jacinto Halim.  At that time, he  found a total LAD occlusion with high-grade disease in the ostium of the  ramus intermediate vessel, subtotal total circumflex as well as RCA with  some collaterals.  He initially attempted intervention to the LAD system  with PTCA but there was concern of significant thrombus burden and  dissection for which reason he elected to place a 3.0 x 32 mm Liberte stent.  He did have TIMI I flow being improved to TIMI III flow.  Apparently last  evening, the patient's Integrilin was discontinued.  His thoughts ultimately  was that the patient may require additional PCI or possible CABG surgery.   This morning at approximately 6:50 to 7 a.m., the patient developed  recurrent chest tightness, pressure associated with ST-segment elevation in  V3-V6 highly suggestive of reocclusion versus resulting from the ostial  intermediate stenosis.  The patient was titrated on nitroglycerin.  Heparin  was started.  She was given morphine sulfate and transported to [facility] for emergent repeat catheterization.  Initially,  she was taken to Lab 3.  She was prepped and draped.  The catheterization  was performed by obtaining groin access successfully and without  difficulty.  However, at this point there was a fluoroscopy failure.  The entire system  was then shut down and rebooted.  After a moderate amount of waiting time,  again this system did not work.  Another reboot attempt was made.  Despite  waiting for again sometime for this to be successful, it was felt that this  was not going to be successful and consequently another room was opened and  the patient was transported to Lab 5 for the acute catheterization.  This  obviously led to a time delay in establishment of reperfusion time due to  lab fluoroscopy failure.  Cardiac catheterization was then performed  utilizing 6-French Judkins #4 left and right diagnostic catheters.  With the  demonstration of total occlusion of the proximal LAD at the stented segment,  decision was made to attempt intervention.  The patient was maintained on  her heparin drip and was given an additional bolus of Integrilin and started  on heparin drip.  Asahi medium wire was advanced to the stent but seemed to  hang up in the mid-stented segment where there was concern of probable stent  malapposition and suboptimal apposition to the wall leading to the  occlusion.  A 2.0 x 15 mm Maverick balloon was then used to help navigate  the wire to make certain that the wire was not going underneath the struts  and was staying intraluminal.  Ultimately, the stented segment was crossed  and the wire was advanced to the more distal LAD segment.  Dilatation was  done throughout the stented segment with this 2.0 balloon with restoration  of TIMI III flow.  It also became apparent that the LAD at the apex was  totally occluded as well.  A 3.25 x 20 mm Quantum balloon was then used for  more optimal dilation within the stented segment which had been a 3.0 x 32  mm Liberte nondrug-eluting stent.  Multiple dilatations were made throughout  the entire stented segment up to 3.25 to 3.29 mm.  This again resulted in  excellent  angiographic result.  It still became apparent that the apical LAD  was occluded.  The wire was able to cross the apical LAD.  The 2.0 Maverick  balloon was then reinserted and predilatation was done at the apex up to 4  atmospheres times several inflations with resulting opening of the 100%  occlusion to less than 0% and with again the LAD now wrapping around the  apex also supplying some of the distal RCA collaterals.  Total perfusion  time was approximately 1 hour and 45 minutes which was due to several delays  as noted above.  She became painfree, left the laboratory with TIMI III  flow, feeling well with stable hemodynamics.   HEMODYNAMIC DATA:  Central aortic pressure is 130/86, mean 105.  During the  procedure, she did receive several doses of intracoronary nitroglycerin.  She also had received Versed 2 mg, morphine sulfate as well as Integrilin  and heparin.  ACT was documented to be therapeutic during the procedure.   ANGIOGRAPHIC DATA:  The left main coronary artery was a long vessel which  trifurcated into an LAD, a ramus intermediate vessel and left circumflex  system.   The LAD ostium had narrowing of approximately 50% and then was 20-30%  narrowing before the first diagonal takeoff.  The LAD was totally occluded  after the large septal perforating artery at the stented segment.  There was  TIMI 0 flow.   The ramus intermediate vessel had 95% ostial stenosis followed by 90%  proximal stenosis.   The circumflex vessel was 99% stenosed.  There was filling of the OM1 and  OM2 vessel slowly.  There was collateralization to the distal RCA from the  left coronary circulation.   Injection in the right coronary artery revealed this to be subtotal/totally  occluded proximally.  Again, there was some collateralization distally.   Following successful PTCA of the LAD system, initially with a 200 Maverick  balloon and ultimately a 3.25 x 20 mm Quantum balloon postdilated to  3.29 mm, the entire stented segment was reduced to 0% with brisk TIMI III flow in  the periods of complete stent apposition.  In addition, the apical LAD  occlusion of 100% was opened to 0% with TIMI III flow done with PTCA  utilizing the 2.0 x 15 mm Maverick balloon.  Cine grams were also reviewed  with Dr. Jacinto Halim who was the initial primary operator for the acute event.  The patient left the laboratory in stable condition.   IMPRESSION:  1. Acute thrombosis/occlusion of the left anterior descending artery at      the previous stented segment in the  proximal left anterior descending      artery with evidence for 50% ostial left anterior descending artery      narrowing followed by 30% narrowing before the first diagonal vessel;      95% followed by 90% ostial and proximal ramus intermediate stenosis,      and subtotal occlusion of the circumflex with faint filling of the      obtuse marginal #1 and obtuse marginal #2 vessels.  2. Subtotal total proximal right coronary artery occlusion with excellent      collaterals to the right coronary artery via the left coronary      circulation.  3. Successful percutaneous transluminal coronary angioplasty of the stent      occlusion with ultimate dilatation to 3.29 mm with 100% stenosis being      reduced to 0% with TIMI 0 flow being improved to TIMI III flow and      evidence for successful percutaneous transluminal coronary angioplasty      of the apical distal left anterior descending artery with 100% stenosis      being reduced to 0% done with Integrilin/heparinization.   RECOMMENDATIONS:  [name] is 55 year old female.  She presented today with  an occluded LAD stent at the site of prior stenting from yesterday morning.  This was successfully dilated with re-establishment of TIMI III flow.  However, the patient does have ostial LAD disease, high-grade ostial ramus  intermediate stenosis, subtotal  circumflex disease with collateralization  of the OM1 and OM2 vessels as well  as subtotal/total RCA occlusion with collateralization of distal vessels  which are large.  Surgical consultation will be obtained since ultimately  the patient may benefit from surgical revascularization in light of her  severe multivessel CAD.           ______________________________  Nicki Guadalajara, M.D.     TK/MEDQ  D:  11/13/2005  T:  11/13/2005  Job:  [id number]   cc:   Cristy Hilts. Jacinto Halim, MD

## 2010-07-19 NOTE — Cardiovascular Report (Signed)
[name], [name]              ACCOUNT NO.:  [id number]   MEDICAL RECORD NO.:  [id number]          PATIENT TYPE:  INP   LOCATION:  2313                         FACILITY:  [facility]   PHYSICIAN:  Cristy Hilts. Jacinto Halim, MD       DATE OF BIRTH:  Sep 10, 1955   DATE OF PROCEDURE:  11/12/2005  DATE OF DISCHARGE:                              CARDIAC CATHETERIZATION   PROCEDURES:  Emergent left heart catheterization including:  1. Left ventriculography.  2. Selective left and right coronary arteriography.  3. Percutaneous transluminal cardiac angioplasty and stenting of the      proximal to mid left anterior descending.   INDICATIONS:  [name] is a 55 year old African-American female who was  admitted through EMS to [facility] after she had acute onset of chest  pain.  The pain actually started between 10 and 11 o'clock in the evening,  and she presented to the hospital with crushing chest pain at around 2:30 or  so with ST-segment elevation in V1 to V6.  During this, she was brought  emergently to the cardiac catheterization lab with the diagnosis of acute  anterolateral wall myocardial infarction.   HEMODYNAMIC DATA:  Left ventricular pressure 143/6, diastolic pressure 15  mmHg.  Aortic pressure 147/82 with a mean of 108 mmHg.  There was no  pressure gradient across the aortic valve.   ANGIOGRAPHIC DATA:  Left ventricle:  Left ventricular systolic function was  moderately depressed with ejection fraction of 45% with hyperkinetic base;  however, the mid to distal anterolateral wall and apical wall was severely  hypokinetic.  The distal inferior wall was mildly hypokinetic.  The lateral  wall was akinetic.  There was no significant mitral regurgitation.   Right coronary artery: The right coronary artery is occluded in its proximal  segment.  Distally, it is supplied by collaterals from the LAD.  The distal  RCA has a large PDA branch.   Left main: The left main has a distal 10-20%  stenosis.   LAD: The LAD is a moderate to large-caliber vessel in the proximal segment  with a proximal mild luminal irregularity.  It gives origin to a moderate  size diagonal #1 which has an ostial 70% stenosis.  Following this, there is  a thrombotic and ulcerated 99% stenosis in the proximal and mid segment of  the LAD.  This is a long segment lesion.  The LAD gives origin to several  small diagonals.  It ends at the apex.  There was TIMI-1 flow noted in this  LAD with apical segment of the LAD showing no flow.  There were extensive  collaterals from the LAD to the right coronary artery.   Circumflex artery: The circumflex artery is a moderate to large-caliber  vessel in the proximal segment; however, it is completely occluded in its  mid segment.  There was no distal collateralization seen for the circumflex.   Ramus intermediate: The ramus intermediate has a high-grade ostial 95%  stenosis followed by a tandem proximal 80% stenosis.  It is a moderate-  caliber vessel that has mild luminal irregularity distally.  INTERVENTIONAL DATA:  Successful PTCA and stenting of the proximal and mid  segment of the LAD with a 3.0 x 32 mm Liberte non-drug-eluting stent  deployed at 12 atmospheres of pressure for about 45 seconds.  Post stent  deployment, initial TIMI-1 had improved to TIMI-3 flow with no evidence of  dissection and no evidence of thrombus at the end of the procedure.   RECOMMENDATIONS:  The patient needs aggressive risk factor modification with  behavioral risk modification.  The patient has been using cocaine and has  extensive coronary disease for her age.  Long-term prognosis is extremely  guarded.   We could consider PCI of the ramus branch electively.   TECHNIQUE OF PROCEDURE:  Under usual sterile precautions using a 6-French  right femoral artery access and a 6-French venous access, left heart  catheterization was performed through arterial access sheath.  A  6-French  multipurpose B2 catheter was advanced in the ascending aorta over a 0.04  wire.  The catheter was advanced to the left ventricle.  The left  ventricular pressure was monitored.  Hand contrast injection into the left  ventricle was performed in the LOA and ROA projections.  Catheter was  flushed with saline and pulled back into the ascending aorta, and PG across  the AV was monitored.  Right coronary artery was selectively engaged, and  angiography was performed.  Then the catheter was pulled out of the body,  and a 6-French Judkins left 4 diagnostic catheter was utilized to engage the  left main coronary artery, and angiography was completed.   TECHNIQUE OF INTERVENTION:  Using heparin for anticoagulation and Integrilin  for anticoagulation, a 7-French FL4 guide was utilized to engage the left  main coronary artery.  Using ATW guidewire, the LAD was carefully dilated.  The LAD lesion was carefully crossed, and 0.04 wire was carefully positioned  in the distal LAD.  Then a 3.0 x 30 mm Maverick balloon was used, and  angioplasty at 11 atmospheres was performed.  Following this, a 3.0 x 32 mm  Liberte stent was utilized, and the stent was deployed at 12 atmospheres  pressure for 45 seconds, and angiography was completed.  Excellent results  were noted.  Multiple episodes of intracoronary nitroglycerin were also  administered during the entirety of the procedure.  The patient was  completely chest pain free at the end of the sent implantation.  Brisk flow  was noted including filling of the apical segment of the LAD. After this,  the wire was withdrawn.  Angiography was repeated.  Guide catheter  disengaged and pulled out of the body in the usual fashion.  The patient  tolerated the procedure well.  There were no immediate complications.      Cristy Hilts. Jacinto Halim, MD  Electronically Signed     JRG/MEDQ  D:  11/12/2005  T:  11/12/2005  Job:  [id number]

## 2010-07-19 NOTE — Op Note (Signed)
[name], [name]              ACCOUNT NO.:  [id number]   MEDICAL RECORD NO.:  [id number]          PATIENT TYPE:  INP   LOCATION:  2313                         FACILITY:  [facility]   PHYSICIAN:  Evelene Croon, M.D.     DATE OF BIRTH:  [birth year]   DATE OF PROCEDURE:  11/17/2005  DATE OF DISCHARGE:                                 OPERATIVE REPORT   PREOPERATIVE DIAGNOSIS:  1. Severe three vessel coronary disease status post anterior myocardial      infarction x2.  2. Severe ischemic mitral regurgitation with severe pulmonary      hypertension.   POSTOPERATIVE DIAGNOSIS:  1. Severe three vessel coronary disease status post anterior myocardial      infarction x2.  2. Severe ischemic mitral regurgitation with severe pulmonary      hypertension.   OPERATIVE PROCEDURE:  Median sternotomy, extracorporeal circulation,  coronary bypass graft surgery x5 using left internal mammary artery graft to  left anterior descending coronary, with a saphenous vein graft to the first  diagonal branch of the left anterior descending, saphenous vein graft to the  second diagonal branch of the left anterior descending, a saphenous vein  graft to the intermediate coronary artery, and a saphenous vein graft to the  posterior descending coronary.  Mitral valve annuloplasty with a 26 mm  Edwards ring.  Endoscopic vein harvesting from both thighs.   ATTENDING SURGEON:  Evelene Croon, M.D.   ASSISTANT:  Jerold Coombe, P.A.-C.   ANESTHESIA:  General endotracheal.   CLINICAL HISTORY:  This patient is a 55 year old woman with multiple cardiac  risk factors as well as a history of alcohol and drug abuse who was admitted  with acute anterior myocardial infarction.  She was taken to catheterization  lab by Dr. Jacinto Halim and found to have high grade proximal LAD stenosis.  She  also has severe three vessel disease with high grade ostial stenosis of a  large ramus branch and occluded left circumflex with  collaterals to two  smaller marginal branches as well as a completely occluded right coronary  artery with collaterals filling the distal vessel.  The left ventricular  ejection fraction was noted to be about 40%.  She underwent angioplasty and  stenting of the proximal LAD successfully.  However, within 24 hours, she  had acutely occluded the stent and developed severe chest pain and EKG  changes.  She was taken back to the catheterization lab by Dr. Tresa Endo which  showed that the proximal LAD was occluded at the stent site.  She had repeat  angioplasty with an opening of the LAD and was continued on heparin and  Integrilin post procedure.  I was asked to see the patient to consider  coronary bypass graft surgery.  I discussed the operative procedure with her  including alternatives, benefits, and risks including bleeding, blood  transfusion, infection, stroke, myocardial infarction, graft failure, and  death.  She understood and agreed to proceed.  She was continued on heparin  and Integrilin preoperatively and the heparin was discontinued on call to  the operating room and Integrilin six hours  preoperatively.   OPERATIVE PROCEDURE:  The patient was taken to the operating room and placed  on the table in the supine position.  After induction of general  endotracheal anesthesia, a Foley catheter was placed in the bladder using  sterile technique.  Then, the chest, abdomen and both lower extremities were  prepped and draped in the usual sterile manner.  Transesophageal  echocardiogram was performed by anesthesiology.  The patient was noted to  have fairly normal PA pressures upon arrival in the operating room but then  developed severe pulmonary hypertension with a PA pressure of 55/35.  Transesophageal echocardiogram at this time showed severe ischemic mitral  regurgitation.  The mitral valve appeared structurally intact.  There was no  sign of prolapse or flail leaflet.  There was moderate  to severe left  ventricular dysfunction with an ejection fraction estimated at about 30%.  I  felt that the patient would require coronary bypass surgery as well as  mitral valve annuloplasty due to the ischemic mitral regurgitation.  Then,  the chest, abdomen and both lower extremities were prepped and draped in the  usual sterile manner.  The chest was entered through a median sternotomy  incision.  The pericardium was opened in the midline.  Examination of the  heart showed in good ventricular contractility.  The ascending aorta was  fairly short but of normal caliber and good quality.   Then, the left internal mammary artery was harvested from the chest wall as  a pedicle graft.  This was a medium caliber vessel with excellent blood flow  through it.  At the same time, a segment of greater saphenous vein was  harvested from both thighs using endoscopic vein harvest technique.  This  vein was of medium size and good quality.   Then, the patient was heparinized and when an adequate activated clotting  time was achieved, the distal ascending aorta was cannulated using a 20-  [place] aortic cannula for arterial in flow.  Venous outflow was achieved  using bicaval venous cannulation with a 26 French metal tip right angle  cannula placed through a pursestring suture in the superior vena cava and a  36-French plastic right angle cannula placed through a pursestring suture in  the low right atrium.  An antegrade cardioplegia and vent cannula was  inserted in the aortic root.   The patient was placed on cardiopulmonary bypass and the distal coronaries  were identified.  The LAD was intramyocardial along its entire extent.  It  was located near the surface in its mid portion where it was a large  graftable vessel with minimal disease.  The first diagonal branch was also  intramyocardial but located proximally.  There were two sub branches and the more lateral sub branch was larger and chosen  for grafting.  It appeared to  communicate on angiogram.  The intermediate was also intramyocardial but was  located proximally and was traced down into the muscle where it was suitable  for grafting.  The first and second marginal branches beyond this were  small, diffusely diseased vessels, and not felt to be graftable.  There was  evidence of old lateral infarction with scar present as well as inferior  infarction with scar present.  The right coronary gave off a moderate sized  posterior descending branch and then two small posterolateral branches that  were not graftable.   Then, the aorta was crossclamped and 500 mL of cold blood antegrade  cardioplegia was administered  in the aortic root with quick arrest the  heart.  Systemic hypothermia to 28 degrees centigrade and topical  hypothermic with iced saline was used.  A temperature probe was placed in  the septum and insulating pad in the pericardium.   Then, the first distal anastomosis was performed to the posterior descending  coronary.  The internal diameter was about 1.75 mm.  The conduit used was a  segment of greater saphenous vein.  Anastomosis was performed in an end-to-  side manner using continuous 7-0 Prolene suture.  Flow was noted through the  graft and was excellent.  A dose of cardioplegia was given down this vein  graft.   The second distal anastomosis was performed to the first diagonal branch.  The internal diameter was about 1.6 mm.  The conduit used was the second  segment of greater saphenous vein.  The anastomosis was performed in an end-  to-side manner using continuous 7-0 Prolene suture.  Flow was noted through  the graft and was excellent.   The third distal anastomosis was performed to the second diagonal branch.  The internal diameter was about 1.6 mm.  The conduit used was a third  segment of greater saphenous vein. Anastomosis was performed in an end-to-  side manner using continuous 7-0 Prolene  suture.  Flow was noted through the  graft and was excellent.   The fourth distal anastomosis was performed of the intermediate coronary.  The internal diameter was about 1.75 mm.  The conduit used was a fourth  segment of greater saphenous vein and the anastomosis was performed in an  end-to-side manner using continuous 7-0 Prolene suture.  Flow was noted  through the graft was excellent.  Then, a dose of cardioplegia was given  down the vein grafts and the aortic root.   The fifth distal anastomosis was then performed to the mid portion of the  LAD.  The internal diameter was about 2 mm.  The conduit used was the left  internal mammary graft and this was brought through an opening in the left  pericardium anterior to the phrenic nerve.  It was anastomosed to the LAD in  an end-to-side manner using continuous 8-0 Prolene suture.  The pedicle was  sutured to the epicardium with 6-0 Prolene sutures.  Then, another dose of cardioplegia was given down vein grafts and the aortic root.   Then, attention was turned to mitral valve repair.  Additional doses of  cardioplegia were given down the vein grafts at about 20-minute intervals to  maintain myocardial temperature around 10 degrees centigrade.  The left  atrium was opened through a vertical incision in the interatrial groove.  The mitral retractor was placed.  Examination of the mitral valve showed  that the anterior and posterior leaflets were structurally intact and  pliable.  There were no areas of prolapse.  The subvalvular apparatus all  appeared intact.  The left ventricle was filled with iced saline solution  and there was a small amount of regurgitation through the center part of the  valve.  I suspect this was most likely ischemic mitral regurgitation and  elected to treat this with an annuloplasty ring.  Then, a series of 2-0  Ethibond sutures were placed around the mitral annulus.  The inter-trigonal  distance as well as the  anterior leaflet were measured and a 26 mm Edwards  annuloplasty ring was chosen.  This had model number 44550, serial number  [id number].  Then, the sutures  were placed through the ring and the ring  lowered into place.  The sutures were tied sequentially.  The ring seated  nicely.  The valve was tested with iced saline solution and was completely  competent.  The left atrium was then closed in two layers using continuous 3-  0 Prolene suture.   Then, the four proximal vein graft anastomoses were performed to the aortic  root in an end-to-side manner using continuous 6-0 Prolene suture.  This was  somewhat difficult due to the short length of her aorta.  The clamp was then  removed from mammary pedicle.  There is rapid warming of the ventricular  septum and return of spontaneous ventricular fibrillation.  The crossclamp  was removed with a time of 166 minutes and the patient was defibrillated  into sinus rhythm.  She was rewarmed to 37 degrees centigrade.  The proximal  and distal anastomoses appeared hemostatic and the lie of the grafts  satisfactory.  Graft markers were placed around the proximal anastomoses.  Two temporary right ventricular and right atrial pacing wires were placed  and brought through the skin.   When the patient had been rewarmed to 37 degrees centigrade, she was weaned  from cardiopulmonary bypass on low dose dopamine and milrinone.  Total  bypass time was 185 minutes.  Transesophageal echocardiogram was performed  and showed improved left ventricular function.  There was no mitral  regurgitation.  There was no significant tricuspid regurgitation.  The  aortic valve appeared unremarkable.  Protamine was then given and the venous  and aortic cannulae were removed without difficulty.  Hemostasis was  achieved.  Three chest tubes were placed, two in the posterior pericardium,  one in the left pleural space, one in the anterior mediastinum.  The pericardium was loosely  reapproximated over the heart.  The sternum was  closed with #6 stainless steel wires.  The fascia was closed with continuous  #1 Vicryl suture.  The subcu tissue was closed with continuous 2-0 Vicryl  and the skin with 3-0 Vicryl subcuticular closure.  The lower extremity vein  harvest sites were closed in layers in a similar  manner.  The sponge, needle and instrument counts were correct according to  the scrub nurse.  Dry sterile dressing was applied over the incisions and  around the chest tubes which were hooked to Pleur-Evac suction.  The patient  remained hemodynamically stable and was transferred to the SICU in guarded  but stable condition.      Evelene Croon, M.D.  Electronically Signed     BB/MEDQ  D:  11/17/2005  T:  11/18/2005  Job:  [id number]   cc:   [facility]

## 2010-07-19 NOTE — H&P (Signed)
[name], [name] NO.:  [id number]   MEDICAL RECORD NO.:  [id number]          PATIENT TYPE:  EMS   LOCATION:  MAJO                         FACILITY:  [facility]   PHYSICIAN:  Ulyses Amor, MD DATE OF BIRTH:  1955/10/04   DATE OF ADMISSION:  11/12/2005  DATE OF DISCHARGE:                                HISTORY & PHYSICAL   [name] is a 55 year old black woman who is admitted to [facility] for an acute anterior myocardial infarction.   The patient, who has no past history of cardiac disease, experienced the  sudden onset of chest pain 1 hour ago.  She had used cocaine earlier in the  day.  The chest pain is described as pressure in the center of her chest.  It does not radiate.  It is associated with dyspnea, diaphoresis, and  nausea.  There are no exacerbating or ameliorating factors.  It appears not  to be related to position, activity, meals or respirations.  She presented  to the emergency department where her electrocardiogram revealed evidence of  an acute anterior myocardial infarction.   As noted, the patient has no past history of cardiac disease, including no  history of chest pain, myocardial infarction, coronary artery disease,  congestive heart failure, or arrhythmia.  She has a history of cigarette  smoking and a family history of early coronary artery disease (mother).  There is no history of diabetes mellitus, dyslipidemia, or hypertension.   MEDICATIONS:  None.   ALLERGIES:  None.   SOCIAL HISTORY:  The patient lives with her husband who is suffering from  cancer.  The patient does not work.  She drinks alcohol heavily she reports.  She smokes cigarettes as noted above.   FAMILY HISTORY:  Notable for early coronary artery disease and is notable  for coronary artery disease in her mother.   OPERATIONS:  None.   REVIEW OF SYSTEMS:  Reveals no problems related to her head, eyes, ears,  nose, mouth, throat, lungs,  gastrointestinal system, genitourinary systems  or extremities.  There is no history of neurologic or psychologic disorder.  There is no history of fever, chills, or weight loss.   PHYSICAL EXAMINATION:  VITAL SIGNS:  Blood pressure 156/97.  Pulse 70 and  regular.  Respirations 19.  Temperature 98.  GENERAL:  The patient was an obese, middle-aged black woman in no  discomfort.  She was alert, oriented, appropriate, and responsive.  HEENT:  Head, eyes, nose, and mouth were normal.  NECK:  The neck was without thyromegaly or adenopathy.  Carotid pulses were  palpable without bruits.  CARDIAC EXAM:  Revealed a normal S1, S2.  There was no S3, S4, murmur, rub,  or click.  Cardiac rhythm was regular.  No chest wall tenderness was noted.  LUNGS:  The lungs were clear.  ABDOMEN:  The abdomen was soft and nontender.  There was no mass,  hepatosplenomegaly, bruits, distention, rebound, guarding or rigidity.  Bowel sounds were normal.  BREAST, PELVIC AND RECTAL EXAMINATIONS:  Not performed as they were not  pertinent to the reason  for acute care hospitalization.  EXTREMITIES:  The extremities were without edema, deviation, or deformity.  Radial and dorsalis pedal pulses were palpable bilaterally.  NEUROLOGIC:  Brief screening neurologic survey was unremarkable.   The electrocardiogram demonstrated acute anterior ST segment elevation.  Subsequent electrocardiograms demonstrated diminution of the ST segment  elevation.   The chest radiograph had not yet been performed at the time of this  dictation.   Potassium was 4.4, BUN 13, creatinine 1.1.  The remaining studies were  pending at the time of this dictation.   IMPRESSION:  Acute anterior myocardial infarction.  Associated with cocaine  use.   PLAN:  1. Emergent cardiac catheterization:  Dr. Jacinto Halim notified.  2. Aspirin, intravenous heparin, and intravenous nitroglycerin in the      interim.  3. Discontinuation of smoking and cocaine  discussed with the patient.      Ulyses Amor, MD  Electronically Signed     MSC/MEDQ  D:  11/12/2005  T:  11/12/2005  Job:  [id number]   cc:   Ulyses Amor, MD  Cristy Hilts Jacinto Halim, MD

## 2010-07-19 NOTE — Discharge Summary (Signed)
NAMEMARINELL, [name]              ACCOUNT NO.:  [id number]   MEDICAL RECORD NO.:  [id number]          PATIENT TYPE:  INP   LOCATION:  2003                         FACILITY:  [facility]   PHYSICIAN:  Evelene Croon, M.D.     DATE OF BIRTH:  1955-11-20   DATE OF ADMISSION:  11/12/2005  DATE OF DISCHARGE:  11/25/2005                                 DISCHARGE SUMMARY   ADDENDUM:   DATE OF BIRTH:  [birth year]   PHYSICIAN:  Evelene Croon, M.D.   DATE OF ADMISSION:  November 12, 2005   DATE OF DISCHARGE:  November 25, 2005   This is an addendum to a previously dictated discharge summary.  That job  number is [id number].   [name] was discharged home following coronary artery bypass graft surgery  and mitral valve repair, on November 25, 2005.  Please see previously  dictated discharge summary for specific details.  As noted, she was  diagnosed with diabetes mellitus type 2 during the hospitalization with a  hemoglobin A1c of 7.8.  This was initially managed on Lantus insulin, but  prior to discharge this was discontinued and instead she was started on  Glucophage 500 mg p.o. b.i.d.  Diabetes education consult was ordered and  she did receive teaching, as well as instructions on self blood glucose  monitoring.  In regards to her Coumadin therapy, she also received teaching  on that as well.  Medications were adjusted slightly by cardiology, as noted  in the updated discharge medication list below.   UPDATED DISCHARGE MEDICATIONS:  1. Aspirin 81 mg p.o. daily.  2. Coreg 3.125 mg 1 p.o. b.i.d.  3. Lipitor 40 mg p.o. q.p.m.  4. Plavix 75 mg p.o. daily.  5. Coumadin 6 mg p.o. q.p.m. and as directed.  6. Amiodarone 200 mg 1 p.o. daily.  7. Metformin 500 mg p.o. b.i.d.  8. Lasix 40 mg p.o. daily x7 more days.  9. Potassium chloride 20 mEq p.o. daily x7 more days.  10.Tylox 1-2 tablets p.o. q.4-6 hours p.r.n. pain.   DISCHARGE INSTRUCTIONS:  Discharge instructions are as previously  dictated  with exception that home health nursing has been arranged prior to discharge  and they will draw her first INR on November 27, 2005, and fax those  results to Dr. Verl Dicker office.  She is also instructed to check her blood  sugars before breakfast and at bedtime and to  keep a record so she can take those to her followup appointment with her  primary physician.  In addition, her appointment with Dr. Laneta Simmers has been  scheduled for January 06, 2006, at 12:15 with a chest x-ray at [facility] one hour before.  She will call sooner if needed.      Jerold Coombe, P.A.      Evelene Croon, M.D.  Electronically Signed    AWZ/MEDQ  D:  11/25/2005  T:  11/25/2005  Job:  [id number]   cc:   Evelene Croon, M.D.  Cristy Hilts. Jacinto Halim, MD

## 2010-07-19 NOTE — Op Note (Signed)
NAMEALIX, LAHMANN NO.:  [id number]   MEDICAL RECORD NO.:  [id number]          PATIENT TYPE:  INP   LOCATION:  2313                         FACILITY:  [facility]   PHYSICIAN:  Bedelia Person, M.D.        DATE OF BIRTH:  03-01-1956   DATE OF PROCEDURE:  11/17/2005  DATE OF DISCHARGE:                                 OPERATIVE REPORT   TRANSESOPHAGEAL CARDIOGRAPHY REPORT:  Ms. Gombos is scheduled for coronary  artery bypass grafting.  The TEE will be used intraoperative to assess  extent of left ventricular dysfunction.   After induction with general anesthesia, the airway was secured with an  oroendotracheal tube.  The transesophageal probe was lubricated, placed in a  sleeve which was heavily lubricated, then placed blindly down the oropharynx  to the 40-45 cm mark where it remained throughout the case.  At completion  of the procedure, the probe was removed.  There was no evidence of oral or  pharyngeal damage.   A pre-bypass examination revealed the left ventricle to be slightly  enlarged.  The appendage was clean.  The septum was intact.  Left ventricle  was overall thickened.  Ejection fraction was moderately depressed,  estimated at around 30%.  There was akinesis of the anterior wall, severe  hypokinesis of the septal and inferior walls.  The mitral valve had thin  leaflets that appeared to be opening and closing appropriately in the  valvular plane.  No prolapse was noted.  Color Doppler revealed 1+ central  mitral regurgitant flow.  After the start of the procedure, blood pressure  moderately increased.  At that time, the mitral regurge was noted to change  significantly to a 3+ central mitral regurgitant jet with significant  increase in pulmonary artery pressures.  There was no flow reversal in  either pulmonary vein.  The aortic valve had 3 leaflets.  They opened and  closed appropriately.  No calcification was noted.  Color Doppler revealed  no aortic  insufficiency.  Right heart appeared normal.  Tricuspid valve  showed mild tricuspid insufficiency.  Swan-Ganz catheter was noted across  the valve.  Patient was placed on cardiopulmonary bypass and underwent  coronary artery bypass grafting x5 vessels plus placement of a mitral  valvular ring.   At the completion of the bypass, no significant air was noted.  Initially  coming off bypass, there was significant depression of the right heart with  elevated pulmonary artery pressures.  Milrinone was started and after a  short period, the left atrium was noted to be unchanged in appearance.  The  left ventricle had improved septal function. There was still akinesis of the  anterior wall and significant hypokinesis of the inferior wall.  The  ejection fraction had overall improved on inotropic dopamine support.  The  mitral valve showed a well-seated mitral ring.  Both leaflets could still be  noted opening and closing appropriately.  Color Doppler showed no mitral  regurgitant flow.  The aortic valve was unchanged in appearance of function  with no aortic insufficiency.  ______________________________  Bedelia Person, M.D.     LK/MEDQ  D:  11/17/2005  T:  11/18/2005  Job:  [id number]

## 2010-07-19 NOTE — Discharge Summary (Signed)
NAMEBRISEIS, [name]              ACCOUNT NO.:  [id number]   MEDICAL RECORD NO.:  [id number]          PATIENT TYPE:  INP   LOCATION:  2003                         FACILITY:  [facility]   PHYSICIAN:  Lezlie Octave, N.P.     DATE OF BIRTH:  [birth year]   DATE OF ADMISSION:  11/12/2005  DATE OF DISCHARGE:  11/25/2005                                 DISCHARGE SUMMARY   DICTATED FOR:  Yates Decamp M.D.   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  She underwent emergent cath, PCI and stent of her LAD.  Twenty-four  hours later she had acute occlusion of the LAD and underwent PCI and  stenting of proximal LAD.  The cath showed 3-vessel disease on MR.  CVTS was  consulted, and she subsequently underwent CABG x5 and a mitral valve  annuloplasty.  She was discharged home on November 23, 2005.  Has done well  until 2 days ago.  She was seen by Dr. Jacinto Halim.  She was started on low-dose  ACE.  She has been complaining about shortness of breath at rest and with  exertion, orthopnea, and the night before admission, she had PND.  Her  original BMP was elevated.  She was seen by Dr. Allyson Sabal.  Her D-dimer was  positive.  She had a CT scan to rule out PE, which was negative.  Her  troponins were mildly elevated at 0.27, 0.31, and 0.34.  She was on  Coumadin, and her INR was therapeutic.  She was changed to p.o. Lasix on  December 14, 2005.  Her BNP had come down from 1390 to 863 on December 14, 2005.  It was decided that she should undergo a 2D echocardiogram which  showed a reduced EF of 25 to 20%.  This is less than it had been previously.  She was seen by Dr. Clarene Duke on December 17, 2005.  Another BNP was checked.  It was 865.  The day before it was 705.  It was decided to give her IV Lasix  40 mg, to recheck her later that day, and if she was stable to be discharged  home.  She was seen by Dr. Clarene Duke  later on on December 17, 2005.  She was  considered stable to be discharged.  She had no shortness of breath.  She  could lie flat.  It was decided to put her back on an ACE, and she was  discharged home.  She does have problems financially affording her  medications, so she was switched to generic.  She was given 2 forms for  amiodarone and Lipitor, indigent forms, that she will hand in to our office.   LABORATORY:  December 15, 2005, her hemoglobin was 11.3, hematocrit 33.8,  platelets are 280, WBCs were 5.2.  Her D-dimer was 2.04.  Her sodium was  139, potassium 3.7, glucose 113, BUN 11, creatinine 1.0.  AST was 16.  ALT  was 19.  Glycosylated hemoglobin was 7.0.  CK MB number one 40/1.0 with a  troponin of 0.34, number two 55/2.5 with a troponin of 0.31, number three  60/1.6 with a troponin of 0.27.  Lipid profile showed a total cholesterol of  100, triglycerides 147, HDL 30, and her LDL was 41.  Her TSH was 3.359.  Chest x-ray on December 16, 2005 showed small effusion and right basilar  atelectasis, stable mild cardiomegaly.  Her CT angio showed no evidence of  acute pulmonary embolism, diffuse pulmonary edema with bilateral pleural  effusions, and bibasilar atelectasis, moderate cardiomegaly.  A 2D echo  showed an EF of 25%.  Carpentier-Edwards ring had some mild mitral stenosis.  Gradient was 4.2 mmHg.  Pressure half time was 4.18 cm squared.  RV systolic  function mildly reduced.  There was mild to moderate pulmonary regurg.   DISCHARGE MEDICATIONS:  Coreg 3.125 mg 2 times per day, Lipitor 40 mg 1 time  per day, amiodarone 200 mg 1 time per day, Glucophage 500 mg 2 times per  day, warfarin 5 mg a day, cephalexin 500 mg 4 times a day until December 19, 2005, lisinopril 5 mg 1 time per day, Lipitor 40 mg 1 time per day.  She  should stop taking her Altace.  She should only take the medications on this  list.  She will go to the office and pick up Lipitor samples.  She will see  Dr.  Jacinto Halim in 1 week.  She will call to get an appointment.   DISCHARGE DIAGNOSES:  1. Congestive heart failure.  2. Ischemic cardiomegaly with an ejection fraction of 25%.  3. Coronary artery disease with acute myocardial infarction, acute      emergent cath with stent placed in September with subsequent acute      occlusion 24 hours later and then subsequent coronary artery bypass      graft and mitral valve annuloplasty and ring.  4. Diabetes mellitus.  Hemoglobin A1c 7.0.  5. Anticoagulation therapeutic while in hospital.  She is to have her pro      times checked by [facility].  6. Hyperlipidemia.  7. Wound dehiscence of her right leg harvest site.  Put on cephalexin for      5 days.      Lezlie Octave, N.P.     BB/MEDQ  D:  12/17/2005  T:  12/18/2005  Job:  [id number]   cc:   Evelene Croon, M.D.

## 2010-07-19 NOTE — Discharge Summary (Signed)
[name], [name]              ACCOUNT NO.:  [id number]   MEDICAL RECORD NO.:  [id number]          PATIENT TYPE:  INP   LOCATION:  2003                         FACILITY:  [facility]   PHYSICIAN:  Evelene Croon, M.D.     DATE OF BIRTH:  10/22/1955   DATE OF ADMISSION:  11/12/2005  DATE OF DISCHARGE:                                 DISCHARGE SUMMARY   ADMIT DIAGNOSIS:  Chest pain.   PAST MEDICAL HISTORY AND DISCHARGE DIAGNOSES:  1. History of alcohol and cocaine abuse.  2. Tobacco abuse.  3. Severe 3-vessel coronary artery disease status post anterior myocardial      infarction x2 status post coronary artery bypass grafting x5.  4. Severe ischemic mitral regurgitation with severe pulmonary      hypertension, status post mitral valve annuloplasty.  5. Percutaneous transluminal angioplasty and stenting to the proximal left      anterior descending x2.  6. Postoperative hypotension.  7. Postoperative anemia, stable.  8. Newly diagnosed diabetes mellitus.   ALLERGIES:  No known drug allergies.   BRIEF HISTORY:  The patient is a 55 year old African-American female who  presented to [facility] Room with complaints of sudden onset of  chest pain approximately 1 hour prior to admission.  She had used cocaine  earlier in the day.  The pain was associated with dyspnea, diaphoresis, and  nausea.  An EKG was obtained and this revealed acute anterior ST segment  elevation.  Secondary to these findings, an emergent cardiology consultation  was obtained by Dr. Jacinto Halim.  Patient was admitted and taken for emergent  cardiac cath.   HOSPITAL COURSE:  Patient presented to the emergency room of [facility] with  a complaint of severe chest pain as previously stated.  The patient was  taken emergently to the cardiac cath lab by Dr. Jacinto Halim and was found to have  a high-grade approximate LAD stenosis as well as severe 3-vessel disease.  She underwent angioplasty and stenting of the proximal LAD  successfully.  However, within 24 hours she had acutely occluded the stent and developed  severe chest pain with EKG changes and was therefore taken back to the  cardiac cath lab by Dr. Tresa Endo, which revealed that the proximal LAD had  occluded at the stent site.  She underwent repeat angioplasty with an  opening of the LAD and was continued on heparin and Integrilin post-  procedure.  Secondary to this, Dr. Laneta Simmers of CVTS services was consulted  regarding surgical revascularization.  Dr. Laneta Simmers evaluated the patient on  November 13, 2005, and it was his opinion that the patient should proceed  with coronary artery bypass grafting.  The patient remained in stable  condition until such time that she could be taken to the OR.   Patient was taken to the OR on November 17, 2005, for coronary artery  bypass grafting x5.  Preoperative TEE revealed severe ischemic mitral  regurgitation with pulmonary hypertension, and therefore, she also underwent  a mitral valve annuloplasty with a 26-mm ring.  Endoscopic vessel harvesting  was performed on the bilateral thighs.  The left internal mammary artery was  grafted to the LAD.  The saphenous grafted to the first diagonal, saphenous  vein was grafted to the second diagonal, and saphenous vein was grafted to  the intermediate coronary, and saphenous vein was grafted to the posterior  descending coronary.  Patient tolerated the procedure well and was given  __________  immediately postoperatively.  Postoperative TEE revealed no  mitral regurgitation.  Patient was transferred from the OR to the SICU in  stable condition.  Patient was extubated without complication, woke up from  anesthesia neurologically intact.   Patient's postoperative course has progressed as expected.  Throughout the  postoperative course, she has been hypotensive and it took several days to  wean her drips.  They were weaned successfully.  All invasive chest tubes  and monitors  were discontinued in a routine manner and she tolerated this  well.  She has been volume overloaded postoperatively and has been diuresed  accordingly.   The patient was noted to have an elevated hemoglobin A1c of 8  preoperatively.  Her newly diagnosed diabetes mellitus has been well  maintained throughout the postoperative course, initially with an insulin  drip and then with Lantus insulin.  She will likely be started on an oral  agent prior to discharge.  Patient will require Coumadin x3 months secondary  to the mitral ring.  This was started on postoperative day 2.  Patient also  began cardiac rehab on postoperative day 3 and has continued to increase her  tolerance to a satisfactory level at this time.   Patient was noted to have an acute blood loss anemia throughout the  postoperative course and was transfused with 1 unit with packed RBCs on  postoperative day 4.  This has remained stable throughout the remainder of  the postoperative course.   On postoperative day 6, the patient is afebrile with stable vital signs and  maintaining a normal sinus rhythm.  Her bowel function has returned.  She is  without complaint.  Her chest x-ray is stable.   PHYSICAL EXAMINATION:  On physical exam, cardiac is regular rate and rhythm.  The lungs show crackles in the left base.  The abdomen is benign.  The incisions are clean, dry, and intact.  There is no edema present in the bilateral lower extremities.  Her anemia is stable and her diabetes mellitus is well maintained.  She is  ambulating well.  As long as the patient continues to progress in the  current manner, she should be ready for discharge within the next 1 to 2  days pending morning-round reevaluation.   LABORATORY DATA:  CBC and BMP on November 23, 2005:  White count 8,  hemoglobin 9.4, hematocrit 27.5, platelets 296, sodium 137, potassium 3.8, BUN 11, creatinine 0.9, glucose 96.  INR on November 23, 2005:  1.4.   Condition  on discharge is improved.   INSTRUCTIONS:  1. Medications:  Aspirin 81 mg daily, Coreg 3.125 mg 1/2 tab daily,      Lipitor 40 mg daily, Plavix 75 mg daily, amiodarone 200 mg b.i.d.,      Coumadin dose to be determined prior to discharge, Tylox 1 to 2 q.4-6      hours p.r.n. pain.  2. Patient received specific instructions on activity, diet, and wound      care.   FOLLOWUP APPOINTMENTS:  1. PT and INR blood work should be drawn on November 27, 2005, at Dr.      Verl Dicker  office.  The patient will be instructed to contact his office      for an appointment time.  2. With Dr. Jacinto Halim 2 weeks after discharge, patient will be instructed to      contact his office for an appointment.  3. With Dr. Laneta Simmers 3 weeks after discharge.  CVTS office will contact the      patient with the date and time of that appointment.      Pecola Leisure, [place]      Evelene Croon, M.D.  Electronically Signed    AY/MEDQ  D:  11/23/2005  T:  11/23/2005  Job:  [id number]   cc:   Evelene Croon, M.D.  Cristy Hilts. Jacinto Halim, MD

## 2010-09-29 ENCOUNTER — Inpatient Hospital Stay (HOSPITAL_COMMUNITY)
Admission: EM | Admit: 2010-09-29 | Discharge: 2010-10-01 | DRG: 293 | Disposition: A | Discharge status: Home / Self Care | Payer: Medicaid Other | Attending: Internal Medicine | Admitting: Internal Medicine

## 2010-09-29 ENCOUNTER — Encounter: Payer: Self-pay | Admitting: Internal Medicine

## 2010-09-29 ENCOUNTER — Emergency Department (HOSPITAL_COMMUNITY): Payer: Medicaid Other

## 2010-09-29 DIAGNOSIS — I2510 Atherosclerotic heart disease of native coronary artery without angina pectoris: Secondary | ICD-10-CM | POA: Diagnosis present

## 2010-09-29 DIAGNOSIS — Z7982 Long term (current) use of aspirin: Secondary | ICD-10-CM

## 2010-09-29 DIAGNOSIS — E119 Type 2 diabetes mellitus without complications: Secondary | ICD-10-CM | POA: Diagnosis present

## 2010-09-29 DIAGNOSIS — E785 Hyperlipidemia, unspecified: Secondary | ICD-10-CM | POA: Diagnosis present

## 2010-09-29 DIAGNOSIS — Z79899 Other long term (current) drug therapy: Secondary | ICD-10-CM

## 2010-09-29 DIAGNOSIS — Z951 Presence of aortocoronary bypass graft: Secondary | ICD-10-CM

## 2010-09-29 DIAGNOSIS — F17200 Nicotine dependence, unspecified, uncomplicated: Secondary | ICD-10-CM | POA: Diagnosis present

## 2010-09-29 DIAGNOSIS — N289 Disorder of kidney and ureter, unspecified: Secondary | ICD-10-CM | POA: Diagnosis present

## 2010-09-29 DIAGNOSIS — I10 Essential (primary) hypertension: Secondary | ICD-10-CM | POA: Diagnosis present

## 2010-09-29 DIAGNOSIS — I509 Heart failure, unspecified: Secondary | ICD-10-CM | POA: Diagnosis present

## 2010-09-29 DIAGNOSIS — Z95810 Presence of automatic (implantable) cardiac defibrillator: Secondary | ICD-10-CM

## 2010-09-29 DIAGNOSIS — I5033 Acute on chronic diastolic (congestive) heart failure: Principal | ICD-10-CM | POA: Diagnosis present

## 2010-09-29 DIAGNOSIS — F1410 Cocaine abuse, uncomplicated: Secondary | ICD-10-CM | POA: Diagnosis present

## 2010-09-29 LAB — POCT I-STAT, CHEM 8
BUN: 21 mg/dL (ref 6–23)
Calcium, Ion: 1.17 mmol/L (ref 1.12–1.32)
Chloride: 107 mEq/L (ref 96–112)
HCT: 43 % (ref 36.0–46.0)
Potassium: 4.2 mEq/L (ref 3.5–5.1)
Sodium: 141 mEq/L (ref 135–145)

## 2010-09-29 LAB — CBC
HCT: 40.4 % (ref 36.0–46.0)
Hemoglobin: 13.7 g/dL (ref 12.0–15.0)
MCH: 30.2 pg (ref 26.0–34.0)
MCV: 89.2 fL (ref 78.0–100.0)
RBC: 4.53 MIL/uL (ref 3.87–5.11)
WBC: 6.9 10*3/uL (ref 4.0–10.5)

## 2010-09-29 LAB — DIFFERENTIAL
Lymphocytes Relative: 24 % (ref 12–46)
Lymphs Abs: 1.6 10*3/uL (ref 0.7–4.0)
Monocytes Relative: 7 % (ref 3–12)
Neutro Abs: 4.7 10*3/uL (ref 1.7–7.7)
Neutrophils Relative %: 67 % (ref 43–77)

## 2010-09-29 LAB — CARDIAC PANEL(CRET KIN+CKTOT+MB+TROPI)
CK, MB: 1.6 ng/mL (ref 0.3–4.0)
Troponin I: <0.3 ng/mL (ref <0.30)

## 2010-09-29 LAB — TROPONIN I: Troponin I: <0.3 ng/mL (ref <0.30)

## 2010-09-29 LAB — CK TOTAL AND CKMB (NOT AT ARMC)
CK, MB: 1.6 ng/mL (ref 0.3–4.0)
Relative Index: INVALID (ref 0.0–2.5)

## 2010-09-29 NOTE — H&P (Signed)
Hospital Admission Note Date: 09/29/2010  Patient name: [name] Medical record number: [id number] Date of birth: August 13, 1955 Age: 55 y.o. Gender: female PCP: No primary provider on file.  Medical Service: B2  Attending physician:   Dr. Rogelia Boga Pager: Resident (R2/R3):   Dr. Denton Meek Pager: [phone] Resident (R1):   Dr. Dorise Hiss Pager: [phone]  Chief Complaint: SOB  History of Present Illness:  Pt states that her regimen of medication was changed 2 weeks ago and her lasix was decreased from 80 mg in am and 40 mg at pm to 40 mg in am and 20 mg in pm. Since that time, she has been having increasing amounts of SOB that now makes it impossible for her to walk across a room. It is exacerbated by lying flat. Occasional associated cough which is non-productive and sporadic. She denies F/C/chest pain/ N/V. She used to see Dr. Jacinto Halim for her heart but denies ever having a PCP. Admitted to using cocaine in the past although stated last use was 1 yr ago.   Meds: ASA 325 mg Po daily Spironolactone 25 mg PO daily Metoprolol tartrate 12.5 mg BID ISDM 30 mg PO daily Lasix 20 mg PO 2 in am 1 at pm  Allergies: NKDA  PMH: CAD s/p CABG 6 yr prior CHF HTN DM HPL  Fam hx: MI (mother, died, 9) CVA (father) DM (grandmother)  History   Social History  . Marital Status: Widowed    Spouse Name: N/A    Number of Children: N/A  . Years of Education: N/A   Occupational History  . Not on file.   Social History Main Topics  . Smoking status: Not on file  . Smokeless tobacco: Not on file  . Alcohol Use: Not on file  . Drug Use: Not on file  . Sexually Active: Not on file   Other Topics Concern  . Not on file   Social History Narrative  . No narrative on file    Review of Systems: See HPI  Physical Exam: Vitals: T: 98.3  HR: 66   BP: 106/76  RR: 18  O2 saturation: 96% on RA General: resting in bed, increase in work of breathing with the effort of sitting up HEENT: PERRL,  EOMI, no scleral icterus Cardiac: S1 and S2 distantly heard, no rubs, murmurs or gallops Pulm: wheezes in the bilateral bases, moving normal volumes of air Abd: soft, nontender, nondistended, BS present Ext: warm and well perfused, no pedal edema, peripheral pulses are intact Neuro: alert and oriented X3, cranial nerves II-XII grossly intact, strength and sensation to light touch equal in bilateral upper and lower extremities   Lab results: Results for orders placed during the hospital encounter of 09/29/10 (from the past 24 hour(s))  PRO B NATRIURETIC PEPTIDE     Status: Abnormal   Collection Time   09/29/10 12:42 PM      Component Value Range   BNP, POC 3543.0 (*) 0 - 125 (pg/mL)  DIFFERENTIAL     Status: Normal   Collection Time   09/29/10 12:45 PM      Component Value Range   Neutrophils Relative 67  43 - 77 (%)   Neutro Abs 4.7  1.7 - 7.7 (K/uL)   Lymphocytes Relative 24  12 - 46 (%)   Lymphs Abs 1.6  0.7 - 4.0 (K/uL)   Monocytes Relative 7  3 - 12 (%)   Monocytes Absolute 0.5  0.1 - 1.0 (K/uL)   Eosinophils Relative  1  0 - 5 (%)   Eosinophils Absolute 0.1  0.0 - 0.7 (K/uL)   Basophils Relative 1  0 - 1 (%)   Basophils Absolute 0.1  0.0 - 0.1 (K/uL)  CBC     Status: Abnormal   Collection Time   09/29/10 12:45 PM      Component Value Range   WBC 6.9  4.0 - 10.5 (K/uL)   RBC 4.53  3.87 - 5.11 (MIL/uL)   Hemoglobin 13.7  12.0 - 15.0 (g/dL)   HCT 04.5  40.9 - 81.1 (%)   MCV 89.2  78.0 - 100.0 (fL)   MCH 30.2  26.0 - 34.0 (pg)   MCHC 33.9  30.0 - 36.0 (g/dL)   RDW 91.4 (*) 78.2 - 15.5 (%)   Platelets 195  150 - 400 (K/uL)  CK TOTAL AND CKMB     Status: Normal   Collection Time   09/29/10 12:45 PM      Component Value Range   Total CK 89  7 - 177 (U/L)   CK, MB 1.6  0.3 - 4.0 (ng/mL)   Relative Index RELATIVE INDEX IS INVALID  0.0 - 2.5   TROPONIN I     Status: Normal   Collection Time   09/29/10 12:45 PM      Component Value Range   Troponin I <0.30  <0.30 (ng/mL)    POCT I-STAT, CHEM 8     Status: Abnormal   Collection Time   09/29/10  1:04 PM      Component Value Range   Sodium 141  135 - 145 (mEq/L)   Potassium 4.2  3.5 - 5.1 (mEq/L)   Chloride 107  96 - 112 (mEq/L)   BUN 21  6 - 23 (mg/dL)   Creatinine, Ser 9.56 (*) 0.50 - 1.10 (mg/dL)   Glucose, Bld 77  70 - 99 (mg/dL)   Calcium, Ion 2.13  0.86 - 1.32 (mmol/L)   TCO2 24  0 - 100 (mmol/L)   Hemoglobin 14.6  12.0 - 15.0 (g/dL)   HCT 57.8  46.9 - 62.9 (%)    Imaging results:  Dg Chest 2 View  09/29/2010  *RADIOLOGY REPORT*  Clinical Data: Shortness of breath  CHEST - 2 VIEW  Comparison: 12/13/2009  Findings: Left chest wall AICD is noted with lead in the right ventricle.  Moderate cardiac enlargement is stable.  No pleural effusion or pulmonary edema.  No airspace consolidation is identified.  Review of the visualized osseous structures is unremarkable.  IMPRESSION:  1.  Cardiac enlargement. 2.  No heart failure.  Original Report Authenticated By: Rosealee Albee, M.D.    Other results: EKG: normal EKG, normal sinus rhythm, unchanged from previous tracings.  Assessment & Plan by Problem: 1. SOB - probable CHF exacerbation especially considering proBNP of 3540. We will give 20 mg IV lasix q 12 hrs and watch fluid status. We will also cycle CE and check UDS and TSH. Repeat proBNP in AM, repeat EKG in AM. Start lisinopril 2.5 mg, hold metoprolol since BP is slightly low 106/76. CXR did not show any signs of pneumonia.  2. DM - check HgA1C and FLP  3. CAD - s/p CABG 6 yrs ago. Continue ASA  4. CHF - systolic, last echo July 30th 2011 EF 20-25 %, akinesis of most of the heart.   5. HTN - currently low at 106/76 so held metoprolol, will restart if BP increases.  R2/3______________________________      R1________________________________  ATTENDING: I performed and/or observed a history and physical examination of the patient.  I discussed the case with the residents as noted and reviewed  the residents' notes.  I agree with the findings and plan--please refer to the attending physician note for more details.  Signature________________________________  Printed Name_____________________________

## 2010-09-30 DIAGNOSIS — R0602 Shortness of breath: Secondary | ICD-10-CM

## 2010-09-30 LAB — CARDIAC PANEL(CRET KIN+CKTOT+MB+TROPI)
CK, MB: 1.6 ng/mL (ref 0.3–4.0)
Relative Index: INVALID (ref 0.0–2.5)

## 2010-09-30 LAB — PRO B NATRIURETIC PEPTIDE: Pro B Natriuretic peptide (BNP): 3177 pg/mL — ABNORMAL HIGH (ref 0–125)

## 2010-09-30 LAB — LIPID PANEL
Cholesterol: 204 mg/dL — ABNORMAL HIGH (ref 0–200)
HDL: 35 mg/dL — ABNORMAL LOW (ref >39)
LDL Cholesterol: 143 mg/dL — ABNORMAL HIGH (ref 0–99)
Total CHOL/HDL Ratio: 5.8 RATIO
VLDL: 26 mg/dL (ref 0–40)

## 2010-09-30 LAB — COMPREHENSIVE METABOLIC PANEL
ALT: 17 U/L (ref 0–35)
AST: 20 U/L (ref 0–37)
Alkaline Phosphatase: 280 U/L — ABNORMAL HIGH (ref 39–117)
BUN: 23 mg/dL (ref 6–23)
Calcium: 9.6 mg/dL (ref 8.4–10.5)
GFR calc Af Amer: 52 mL/min — ABNORMAL LOW (ref >60)
Glucose, Bld: 102 mg/dL — ABNORMAL HIGH (ref 70–99)
Potassium: 3.8 mEq/L (ref 3.5–5.1)
Sodium: 139 mEq/L (ref 135–145)
Total Protein: 7.2 g/dL (ref 6.0–8.3)

## 2010-09-30 LAB — CBC
HCT: 38.8 % (ref 36.0–46.0)
Hemoglobin: 12.9 g/dL (ref 12.0–15.0)
MCHC: 33.2 g/dL (ref 30.0–36.0)
RBC: 4.32 MIL/uL (ref 3.87–5.11)
RDW: 16.9 % — ABNORMAL HIGH (ref 11.5–15.5)
WBC: 4.9 10*3/uL (ref 4.0–10.5)

## 2010-09-30 LAB — DRUGS OF ABUSE SCREEN W/O ALC, ROUTINE URINE
Amphetamine Screen, Ur: NEGATIVE
Cocaine Metabolites: POSITIVE — AB
Creatinine,U: 188.5 mg/dL
Opiate Screen, Urine: NEGATIVE
Phencyclidine (PCP): NEGATIVE
Propoxyphene: NEGATIVE

## 2010-09-30 LAB — GLUCOSE, CAPILLARY
Glucose-Capillary: 109 mg/dL — ABNORMAL HIGH (ref 70–99)
Glucose-Capillary: 187 mg/dL — ABNORMAL HIGH (ref 70–99)
Glucose-Capillary: 112 mg/dL — ABNORMAL HIGH (ref 70–99)

## 2010-09-30 LAB — TSH: TSH: 4.79 u[IU]/mL — ABNORMAL HIGH (ref 0.350–4.500)

## 2010-09-30 LAB — HEMOGLOBIN A1C: Mean Plasma Glucose: 137 mg/dL — ABNORMAL HIGH (ref <117)

## 2010-10-01 LAB — GLUCOSE, CAPILLARY: Glucose-Capillary: 102 mg/dL — ABNORMAL HIGH (ref 70–99)

## 2010-10-01 NOTE — Discharge Summary (Signed)
Pt admitted for CHF exacerbation. Please F/U on possibly re-starting metoprolol 12.5 mg bid if BP and HR can withstand it. F/U on weights.

## 2010-10-03 NOTE — Discharge Summary (Signed)
NAMEELIZABETHANN, Fields NO.:  [id number]  MEDICAL RECORD NO.:  [id number]  LOCATION:  4736                         FACILITY:  [facility]  PHYSICIAN:  Blanch Media, M.D.DATE OF BIRTH:  11-Jun-1955  DATE OF ADMISSION:  09/29/2010 DATE OF DISCHARGE:  10/01/2010                              DISCHARGE SUMMARY   ATTENDING PHYSICIAN:  Blanch Media, MD  DISCHARGE DIAGNOSES: 1. Congestive heart failure exacerbation. 2. Diabetes mellitus type 2. 3. Coronary artery disease. 4. Congestive heart failure. 5. Hypertension.  DISCHARGE MEDICATION: 1. Lasix 40 mg p.o. twice daily. 2. Isosorbide mononitrate 30 mg tablet by mouth daily. 3. Lisinopril 2.5 mg tablet by mouth daily. 4. Nitroglycerin 0.5 mg tablet sublingual every 5 minutes as needed up     to 3 doses. 5. Spironolactone 25 mg by mouth daily. 6. Aspirin 325 mg 1 tablet by mouth daily.  DISPOSITION AND FOLLOWUP:  She does have an appointment on October 09, 2010, at 8:30 in the morning with Dr. Dorise Hiss at outpatient clinic.  At this point, please follow up to see if her blood pressure as well as pulse rate would allow to restart her metoprolol 12.5 mg b.i.d.  Please consider doing so.  At this point, please check her weights that she has been measuring at home.  We did send her home with a scale.  PROCEDURES PERFORMED:  None.  CONSULTATIONS:  None.  ADMITTING HISTORY AND PHYSICAL:  This is a 55 year old woman who states that her recent regimen of medication was changed 2 weeks ago.  She said her Lasix was previously 80 mg in the morning and 40 mg at night that was recently changed to 40 mg in the morning 20 mg at night.  Since that time, she has been having increasing amount of shortness of breath, and now making it impossible for her to walk across the room.  It was exacerbated by lying flat.  She does have occasional associated cough, which is nonproductive and sporadic.  She does deny fevers,  chills, chest pain, nausea, or vomiting.  She has recently seen Dr. Jacinto Halim, a cardiologist for her heart, but does deny ever having a PCP.  She does admit to using cocaine in the past, although stated last use was 1 year ago, later amended to that she may have been around cocaine, as recently as last Friday.  MEDICATIONS AT HOME: 1. Aspirin 325 mg p.o. daily. 2. Spironolactone 25 mg p.o. daily. 3. Metoprolol tartrate 12.5 mg b.i.d. 4. Imdur 30 mg p.o. daily. 5. Lasix 20 mg p.o. 2 tablets in the morning, 1 tablet at night, 40 mg     in the morning, 20 mg at night.  ALLERGIES:  No known drug allergies.  PAST MEDICAL HISTORY: 1. She has coronary artery disease status post CABG 6 years ago. 2. CHF. 3. Hypertension. 4. Diabetes mellitus type 2. 5. Hyperlipidemia.  PHYSICAL EXAMINATION:  VITAL SIGNS:  On admission,  her temperature was 98.3, heart rate 66, blood pressure 106/76, respiration 18, O2 saturation 96% on room air. GENERAL:  She is resting in bed, increased work of breathing with effort of just sitting up. HEENT:  Pupils are equal, round, reactive to light  and accommodation. Extraocular motions are intact.  No scleral icterus. HEART:  S1 and S2 are distantly heard.  No rubs, murmurs, gallops. LUNGS:  There are extra sounds heard in the bilateral bases, sounds like possible edema moving low volumes of air. ABDOMEN:  Soft, nontender, nondistended, positive bowel sounds are present. EXTREMITIES:  Warm and well perfused.  No pedal edema.  Peripheral pulses are intact. NEUROLOGIC:  She is alert and oriented x3.  Cranial nerves II through XII are grossly intact.  Strength and sensation to light touch equal in upper and lower extremities.  LABORATORY RESULTS ON THE DAY OF ADMISSION:  There is pro-BNP that is 3543.  We did have a white blood count of 6.9, hemoglobin of 13.7, platelets 195.  First set of cardiac enzymes was negative.  There is a sodium of 141, potassium 4.2,  chloride 107, bicarb 21, BUN is 21, creatinine is 1.5, and glucose is 77.  Please note this is an i-STAT and not a BMET.  IMAGING RESULTS ON THE DAY OF ADMISSION:  There is a chest x-ray that did show cardiac enlargement, no heart failure, but otherwise unremarkable.  They do note that there is a left chest wall AICD in place.  There is an EKG that is normal and is unchanged from prior tracings.  HOSPITAL COURSE BY PROBLEM: 1. CHF exacerbation, so we did do a repeat BMET the following morning.     We did cycle cardiac enzymes x3.  We did a UDS and TSH.  We did     repeat the pro-BNP, and we did get repeat EKG.  We did start     lisinopril 2.5 mg daily and hold her metoprolol since her blood     pressure was slightly low, and her pulse was also slightly low.  We     did start 20 mg IV Lasix q.12 h., which did resolve her symptoms     with the following day she was switched to 40 mg b.i.d. p.o. Lasix,     which did continue diuresing her.  She also was having negative I     and O's at this point, so she was able to walk 100 feet without     getting winded, and she was stable for discharge at this point.  We     did also get some CHF teaching as well as we got her a scale to     take home with her, so that she can weigh herself daily.  We have     instructed her that she should be weighing herself daily and     writing down these weights and bringing this paper to clinic with     her when she comes. We did also tell her to bring all medications with her when she comes to clinic. 2. Diabetes mellitus.  We did check hemoglobin A1c, fasting lipid     panel.  We did switch her to sliding scale insulin while she was     here. 3. Coronary artery disease.  She is status post CABG 6 years ago.  We     did continue her aspirin. 4. CHF, diastolic failure.  Last echo was September 29, 2009, did show an     EF of  20-25%, akinesis with most of the heart.  We did not repeat     this echo, as she does  already have AICD in place and a repeat echo     will not  really change our management at this time.  Her CHF     exacerbation was resolved by the time of discharge.  She was stable     to be discharged home. 5. Hypertension.  Her blood pressure was currently low on admission     106/76.  We did hold her metoprolol, and we did also start     lisinopril, which was indicated for her CHF, so we will restart it     possibly in clinic if her blood pressure in improved, and her pulse     rate would withstand the metoprolol, so at this point the patient     was stable for discharge. 6. Substance abuse. We did discuss her positive UDS for cocaine and advise her to stay away from cocaine. At time of follow up it may be indicated to direct her to available resources for quitting.   VITAL SIGNS ON THE DAY OF DISCHARGE:  Her temperature was 97.6, pulse 52, respirations 20, blood pressure 101/68, and she was satting 94% on room air.  LABORATORY RESULTS:  There were two sets of cardiac enzymes that did come back negative.  There was a repeat pro-BNP that was trending down to 3177.  There was a sodium of 139, potassium 3.8, chloride of 104, bicarb 25, BUN 23, creatinine 1.30, this is at her baseline, per check of the records she is chronically between 1.3 and 1.4 with her creatinine.  There was a bilirubin of 0.7, alk phos of 280, AST of 20, ALT is 17, total protein 7.2, albumin 3.6, calcium 9.6.  It was a fasting lipid panel that was drawn that did show cholesterol of 204, triglyceride of 128, and HDL 35 and an LDL of 143.  There is a hemoglobin A1c that was 6.4.  There was a TSH that was done that was 4.79, so I would recommend rechecking this when she is not acutely ill, so please send to recheck in 1-2 months or 6-8 weeks to determine if she is truly having any kind of thyroid problem or not.  There was a urine drug screen that was positive for cocaine.  There was no further imaging to result and  at this time, the patient was deemed stable for discharge and was discharged to her home with followup on clinic with Dr. Dorise Hiss.    ______________________________ Genella Mech, MD   ______________________________ Blanch Media, M.D.    EK/MEDQ  D:  10/01/2010  T:  10/01/2010  Job:  [id number]  Electronically Signed by Genella Mech MD on 10/02/2010 02:54:34 PM Electronically Signed by Blanch Media M.D. on 10/03/2010 10:28:07 AM

## 2010-10-09 ENCOUNTER — Encounter: Payer: Self-pay | Admitting: Internal Medicine

## 2010-12-02 LAB — BASIC METABOLIC PANEL
CO2: 30
Calcium: 9.6
Chloride: 95 — ABNORMAL LOW
Creatinine, Ser: 1.25 — ABNORMAL HIGH
Glucose, Bld: 88

## 2010-12-02 LAB — GLUCOSE, CAPILLARY
Glucose-Capillary: 109 — ABNORMAL HIGH
Glucose-Capillary: 125 — ABNORMAL HIGH

## 2010-12-02 LAB — PROTIME-INR
INR: 1.9 — ABNORMAL HIGH
Prothrombin Time: 22.7 — ABNORMAL HIGH

## 2010-12-02 LAB — HEPATIC FUNCTION PANEL
AST: 33
Albumin: 3.1 — ABNORMAL LOW
Total Protein: 6.8

## 2010-12-04 LAB — PROTIME-INR
INR: 1.4
INR: 1.4
INR: 1.7 — ABNORMAL HIGH
INR: 1.9 — ABNORMAL HIGH
INR: 2 — ABNORMAL HIGH
INR: 2.1 — ABNORMAL HIGH
Prothrombin Time: 18.1 — ABNORMAL HIGH
Prothrombin Time: 18.2 — ABNORMAL HIGH
Prothrombin Time: 20.9 — ABNORMAL HIGH
Prothrombin Time: 22.4 — ABNORMAL HIGH
Prothrombin Time: 24.5 — ABNORMAL HIGH

## 2010-12-04 LAB — HEPATIC FUNCTION PANEL
ALT: 35
ALT: 45 — ABNORMAL HIGH
ALT: 67 — ABNORMAL HIGH
ALT: 72 — ABNORMAL HIGH
ALT: 81 — ABNORMAL HIGH
AST: 31
AST: 36
AST: 60 — ABNORMAL HIGH
AST: 87 — ABNORMAL HIGH
Albumin: 2.6 — ABNORMAL LOW
Albumin: 3 — ABNORMAL LOW
Alkaline Phosphatase: 157 — ABNORMAL HIGH
Alkaline Phosphatase: 174 — ABNORMAL HIGH
Alkaline Phosphatase: 190 — ABNORMAL HIGH
Bilirubin, Direct: 0.4 — ABNORMAL HIGH
Bilirubin, Direct: 0.4 — ABNORMAL HIGH
Bilirubin, Direct: 0.5 — ABNORMAL HIGH
Bilirubin, Direct: 0.5 — ABNORMAL HIGH
Indirect Bilirubin: 0.6
Indirect Bilirubin: 0.7
Indirect Bilirubin: 0.8
Total Bilirubin: 1
Total Bilirubin: 1.1
Total Bilirubin: 1.1
Total Bilirubin: 1.3 — ABNORMAL HIGH
Total Protein: 5.6 — ABNORMAL LOW
Total Protein: 5.6 — ABNORMAL LOW

## 2010-12-04 LAB — CBC
HCT: 25.6 — ABNORMAL LOW
HCT: 27 — ABNORMAL LOW
HCT: 28 — ABNORMAL LOW
HCT: 28.1 — ABNORMAL LOW
HCT: 28.9 — ABNORMAL LOW
HCT: 29.1 — ABNORMAL LOW
HCT: 31.4 — ABNORMAL LOW
HCT: 32.3 — ABNORMAL LOW
HCT: 40
Hemoglobin: 10.1 — ABNORMAL LOW
Hemoglobin: 10.6 — ABNORMAL LOW
Hemoglobin: 12.6
Hemoglobin: 8.2 — ABNORMAL LOW
Hemoglobin: 8.8 — ABNORMAL LOW
Hemoglobin: 8.9 — ABNORMAL LOW
Hemoglobin: 9 — ABNORMAL LOW
Hemoglobin: 9.5 — ABNORMAL LOW
Hemoglobin: 9.6 — ABNORMAL LOW
MCHC: 31.5
MCHC: 31.6
MCHC: 32.1
MCHC: 32.3
MCHC: 32.6
MCHC: 32.7
MCV: 82.2
MCV: 83.8
MCV: 84.3
MCV: 85.1
MCV: 85.8
MCV: 86.6
Platelets: 168
Platelets: 173
Platelets: 177
Platelets: 183
Platelets: 184
Platelets: 219
RBC: 2.99 — ABNORMAL LOW
RBC: 3.23 — ABNORMAL LOW
RBC: 3.24 — ABNORMAL LOW
RBC: 3.48 — ABNORMAL LOW
RBC: 3.52 — ABNORMAL LOW
RBC: 3.69 — ABNORMAL LOW
RBC: 3.84 — ABNORMAL LOW
RBC: 4.61
RDW: 19.5 — ABNORMAL HIGH
RDW: 20.2 — ABNORMAL HIGH
RDW: 20.3 — ABNORMAL HIGH
RDW: 20.4 — ABNORMAL HIGH
RDW: 20.6 — ABNORMAL HIGH
RDW: 20.7 — ABNORMAL HIGH
RDW: 21 — ABNORMAL HIGH
RDW: 21.7 — ABNORMAL HIGH
WBC: 5.5
WBC: 5.6
WBC: 5.6
WBC: 6.1
WBC: 6.1
WBC: 6.2
WBC: 6.4
WBC: 6.4

## 2010-12-04 LAB — GLUCOSE, CAPILLARY
Glucose-Capillary: 100 — ABNORMAL HIGH
Glucose-Capillary: 105 — ABNORMAL HIGH
Glucose-Capillary: 105 — ABNORMAL HIGH
Glucose-Capillary: 106 — ABNORMAL HIGH
Glucose-Capillary: 110 — ABNORMAL HIGH
Glucose-Capillary: 116 — ABNORMAL HIGH
Glucose-Capillary: 116 — ABNORMAL HIGH
Glucose-Capillary: 120 — ABNORMAL HIGH
Glucose-Capillary: 122 — ABNORMAL HIGH
Glucose-Capillary: 124 — ABNORMAL HIGH
Glucose-Capillary: 128 — ABNORMAL HIGH
Glucose-Capillary: 129 — ABNORMAL HIGH
Glucose-Capillary: 133 — ABNORMAL HIGH
Glucose-Capillary: 136 — ABNORMAL HIGH
Glucose-Capillary: 145 — ABNORMAL HIGH
Glucose-Capillary: 148 — ABNORMAL HIGH
Glucose-Capillary: 171 — ABNORMAL HIGH
Glucose-Capillary: 25 — CL
Glucose-Capillary: 67 — ABNORMAL LOW
Glucose-Capillary: 70
Glucose-Capillary: 75
Glucose-Capillary: 76
Glucose-Capillary: 78
Glucose-Capillary: 83
Glucose-Capillary: 87
Glucose-Capillary: 89
Glucose-Capillary: 89
Glucose-Capillary: 90
Glucose-Capillary: 95
Glucose-Capillary: 99

## 2010-12-04 LAB — COMPREHENSIVE METABOLIC PANEL
ALT: 51 — ABNORMAL HIGH
ALT: 80 — ABNORMAL HIGH
ALT: 89 — ABNORMAL HIGH
AST: 115 — ABNORMAL HIGH
Albumin: 2.7 — ABNORMAL LOW
Albumin: 2.7 — ABNORMAL LOW
Alkaline Phosphatase: 136 — ABNORMAL HIGH
Alkaline Phosphatase: 168 — ABNORMAL HIGH
Alkaline Phosphatase: 205 — ABNORMAL HIGH
BUN: 11
BUN: 17
BUN: 7
CO2: 11 — ABNORMAL LOW
CO2: 22
Calcium: 8.4
Calcium: 9.8
Chloride: 105
Chloride: 95 — ABNORMAL LOW
Creatinine, Ser: 1.17
GFR calc Af Amer: 59 — ABNORMAL LOW
GFR calc non Af Amer: 45 — ABNORMAL LOW
GFR calc non Af Amer: 49 — ABNORMAL LOW
Glucose, Bld: 21 — CL
Glucose, Bld: 76
Potassium: 3.5
Potassium: 4
Potassium: 5
Sodium: 134 — ABNORMAL LOW
Sodium: 137
Sodium: 139
Total Bilirubin: 1.1
Total Bilirubin: 1.2
Total Protein: 5.4 — ABNORMAL LOW
Total Protein: 5.8 — ABNORMAL LOW
Total Protein: 7.3

## 2010-12-04 LAB — BASIC METABOLIC PANEL
BUN: 12
BUN: 13
BUN: 14
BUN: 5 — ABNORMAL LOW
BUN: 6
BUN: 7
CO2: 21
CO2: 22
CO2: 23
CO2: 33 — ABNORMAL HIGH
Calcium: 8.2 — ABNORMAL LOW
Calcium: 8.5
Calcium: 8.6
Calcium: 9.1
Chloride: 101
Chloride: 103
Chloride: 99
Creatinine, Ser: 0.88
Creatinine, Ser: 1.04
Creatinine, Ser: 1.08
Creatinine, Ser: 1.15
GFR calc Af Amer: >60
GFR calc Af Amer: >60
GFR calc Af Amer: >60
GFR calc non Af Amer: 50 — ABNORMAL LOW
GFR calc non Af Amer: 54 — ABNORMAL LOW
GFR calc non Af Amer: 56 — ABNORMAL LOW
GFR calc non Af Amer: 57 — ABNORMAL LOW
GFR calc non Af Amer: >60
GFR calc non Af Amer: >60
GFR calc non Af Amer: >60
Glucose, Bld: 123 — ABNORMAL HIGH
Glucose, Bld: 163 — ABNORMAL HIGH
Glucose, Bld: 69 — ABNORMAL LOW
Glucose, Bld: 71
Glucose, Bld: 98
Potassium: 3.4 — ABNORMAL LOW
Potassium: 3.7
Potassium: 3.9
Potassium: 4
Potassium: 4.1
Sodium: 131 — ABNORMAL LOW
Sodium: 134 — ABNORMAL LOW
Sodium: 136
Sodium: 138

## 2010-12-04 LAB — BODY FLUID CULTURE: Culture: NO GROWTH

## 2010-12-04 LAB — PROTEIN S ACTIVITY: Protein S Activity: 58 % — ABNORMAL LOW (ref 69–129)

## 2010-12-04 LAB — CK: Total CK: 155

## 2010-12-04 LAB — ALBUMIN, FLUID (OTHER): Albumin, Fluid: 2.1

## 2010-12-04 LAB — B-NATRIURETIC PEPTIDE (CONVERTED LAB)
Pro B Natriuretic peptide (BNP): 1490 — ABNORMAL HIGH
Pro B Natriuretic peptide (BNP): 1504 — ABNORMAL HIGH
Pro B Natriuretic peptide (BNP): 1790 — ABNORMAL HIGH
Pro B Natriuretic peptide (BNP): 2176 — ABNORMAL HIGH
Pro B Natriuretic peptide (BNP): 776 — ABNORMAL HIGH

## 2010-12-04 LAB — ANTITHROMBIN III: AntiThromb III Func: 48 — ABNORMAL LOW (ref 76–126)

## 2010-12-04 LAB — TSH: TSH: 7.846 — ABNORMAL HIGH

## 2010-12-04 LAB — CARDIOLIPIN ANTIBODIES, IGG, IGM, IGA
Anticardiolipin IgA: <7 — ABNORMAL LOW (ref <13)
Anticardiolipin IgG: <7 — ABNORMAL LOW (ref <11)
Anticardiolipin IgM: <7 — ABNORMAL LOW (ref <10)

## 2010-12-04 LAB — PREPARE FRESH FROZEN PLASMA

## 2010-12-04 LAB — LUPUS ANTICOAGULANT PANEL
DRVVT: 48.8 — ABNORMAL HIGH (ref 36.1–47.0)
Lupus Anticoagulant: DETECTED — AB
PTTLA 4:1 Mix: 91.4 — ABNORMAL HIGH (ref 36.3–48.8)

## 2010-12-04 LAB — FACTOR 5 LEIDEN

## 2010-12-04 LAB — CROSSMATCH

## 2010-12-04 LAB — URINE CULTURE
Colony Count: NO GROWTH
Culture: NO GROWTH

## 2010-12-04 LAB — POCT CARDIAC MARKERS
CKMB, poc: 2.2
Myoglobin, poc: 216

## 2010-12-04 LAB — URINE MICROSCOPIC-ADD ON

## 2010-12-04 LAB — POCT I-STAT 4, (NA,K, GLUC, HGB,HCT)
Glucose, Bld: 96
HCT: 36
Hemoglobin: 12.2
Potassium: 4.7
Sodium: 136

## 2010-12-04 LAB — HEPARIN LEVEL (UNFRACTIONATED)
Heparin Unfractionated: 0.26 — ABNORMAL LOW
Heparin Unfractionated: 0.36
Heparin Unfractionated: 0.49

## 2010-12-04 LAB — DIFFERENTIAL
Basophils Relative: 0
Eosinophils Absolute: 0
Monocytes Relative: 8
Neutro Abs: 7.6
Neutrophils Relative %: 78 — ABNORMAL HIGH

## 2010-12-04 LAB — HOMOCYSTEINE: Homocysteine: 13.1

## 2010-12-04 LAB — BETA-2-GLYCOPROTEIN I ABS, IGG/M/A
Beta-2 Glyco I IgG: <4 U/mL (ref <20)
Beta-2-Glycoprotein I IgA: 6 U/mL (ref <10)
Beta-2-Glycoprotein I IgM: <4 U/mL (ref <10)

## 2010-12-04 LAB — PROTEIN C, TOTAL: Protein C, Total: 21 % — ABNORMAL LOW (ref 70–140)

## 2010-12-04 LAB — GLUCOSE, SEROUS FLUID

## 2010-12-04 LAB — URINALYSIS, ROUTINE W REFLEX MICROSCOPIC
Glucose, UA: NEGATIVE
Ketones, ur: NEGATIVE
Protein, ur: NEGATIVE

## 2010-12-04 LAB — HEMOGLOBIN A1C
Hgb A1c MFr Bld: 6.3 — ABNORMAL HIGH
Mean Plasma Glucose: 134

## 2010-12-04 LAB — BODY FLUID CELL COUNT WITH DIFFERENTIAL
Eos, Fluid: 0
Lymphs, Fluid: 23
Monocyte-Macrophage-Serous Fluid: 51
Other Cells, Fluid: 0

## 2010-12-04 LAB — LACTATE DEHYDROGENASE, PLEURAL OR PERITONEAL FLUID: LD, Fluid: 73 — ABNORMAL HIGH

## 2010-12-04 LAB — PROTHROMBIN GENE MUTATION

## 2010-12-04 LAB — PROTEIN C ACTIVITY: Protein C Activity: 28 % — ABNORMAL LOW (ref 75–133)

## 2010-12-04 LAB — APTT
aPTT: 193 — ABNORMAL HIGH
aPTT: 32
aPTT: 33

## 2010-12-04 LAB — PROTEIN S, TOTAL: Protein S Ag, Total: 83 % (ref 70–140)

## 2010-12-04 LAB — HIV ANTIBODY (ROUTINE TESTING W REFLEX): HIV: NONREACTIVE

## 2011-03-14 ENCOUNTER — Other Ambulatory Visit: Payer: Self-pay

## 2011-03-14 ENCOUNTER — Emergency Department (HOSPITAL_COMMUNITY): Payer: Medicaid Other

## 2011-03-14 ENCOUNTER — Inpatient Hospital Stay (HOSPITAL_COMMUNITY)
Admission: EM | Admit: 2011-03-14 | Discharge: 2011-03-20 | DRG: 287 | Disposition: A | Discharge status: Home / Self Care | Payer: Medicaid Other | Attending: Internal Medicine | Admitting: Internal Medicine

## 2011-03-14 ENCOUNTER — Encounter (HOSPITAL_COMMUNITY): Payer: Self-pay | Admitting: *Deleted

## 2011-03-14 DIAGNOSIS — I2510 Atherosclerotic heart disease of native coronary artery without angina pectoris: Secondary | ICD-10-CM | POA: Diagnosis present

## 2011-03-14 DIAGNOSIS — F17200 Nicotine dependence, unspecified, uncomplicated: Secondary | ICD-10-CM | POA: Diagnosis present

## 2011-03-14 DIAGNOSIS — Z91199 Patient's noncompliance with other medical treatment and regimen due to unspecified reason: Secondary | ICD-10-CM

## 2011-03-14 DIAGNOSIS — F1410 Cocaine abuse, uncomplicated: Secondary | ICD-10-CM | POA: Diagnosis present

## 2011-03-14 DIAGNOSIS — I4891 Unspecified atrial fibrillation: Secondary | ICD-10-CM | POA: Diagnosis present

## 2011-03-14 DIAGNOSIS — Z7982 Long term (current) use of aspirin: Secondary | ICD-10-CM

## 2011-03-14 DIAGNOSIS — I10 Essential (primary) hypertension: Secondary | ICD-10-CM | POA: Diagnosis present

## 2011-03-14 DIAGNOSIS — I4729 Other ventricular tachycardia: Secondary | ICD-10-CM | POA: Diagnosis present

## 2011-03-14 DIAGNOSIS — Z951 Presence of aortocoronary bypass graft: Secondary | ICD-10-CM

## 2011-03-14 DIAGNOSIS — I079 Rheumatic tricuspid valve disease, unspecified: Secondary | ICD-10-CM | POA: Diagnosis present

## 2011-03-14 DIAGNOSIS — I509 Heart failure, unspecified: Secondary | ICD-10-CM

## 2011-03-14 DIAGNOSIS — I4949 Other premature depolarization: Secondary | ICD-10-CM | POA: Diagnosis present

## 2011-03-14 DIAGNOSIS — F1010 Alcohol abuse, uncomplicated: Secondary | ICD-10-CM | POA: Diagnosis present

## 2011-03-14 DIAGNOSIS — I959 Hypotension, unspecified: Secondary | ICD-10-CM

## 2011-03-14 DIAGNOSIS — I5023 Acute on chronic systolic (congestive) heart failure: Principal | ICD-10-CM | POA: Diagnosis present

## 2011-03-14 DIAGNOSIS — K7030 Alcoholic cirrhosis of liver without ascites: Secondary | ICD-10-CM | POA: Diagnosis present

## 2011-03-14 DIAGNOSIS — F1910 Other psychoactive substance abuse, uncomplicated: Secondary | ICD-10-CM

## 2011-03-14 DIAGNOSIS — I5022 Chronic systolic (congestive) heart failure: Secondary | ICD-10-CM

## 2011-03-14 DIAGNOSIS — Z9119 Patient's noncompliance with other medical treatment and regimen: Secondary | ICD-10-CM

## 2011-03-14 DIAGNOSIS — E119 Type 2 diabetes mellitus without complications: Secondary | ICD-10-CM | POA: Diagnosis present

## 2011-03-14 DIAGNOSIS — Z79899 Other long term (current) drug therapy: Secondary | ICD-10-CM

## 2011-03-14 DIAGNOSIS — E876 Hypokalemia: Secondary | ICD-10-CM | POA: Diagnosis not present

## 2011-03-14 DIAGNOSIS — I2589 Other forms of chronic ischemic heart disease: Secondary | ICD-10-CM | POA: Diagnosis not present

## 2011-03-14 DIAGNOSIS — I472 Ventricular tachycardia, unspecified: Secondary | ICD-10-CM | POA: Diagnosis present

## 2011-03-14 DIAGNOSIS — Z95810 Presence of automatic (implantable) cardiac defibrillator: Secondary | ICD-10-CM

## 2011-03-14 HISTORY — DX: Atherosclerotic heart disease of native coronary artery without angina pectoris: I25.10

## 2011-03-14 HISTORY — DX: Essential (primary) hypertension: I10

## 2011-03-14 LAB — CK TOTAL AND CKMB (NOT AT ARMC): CK, MB: 1.4 ng/mL (ref 0.3–4.0)

## 2011-03-14 LAB — COMPREHENSIVE METABOLIC PANEL
AST: 26 U/L (ref 0–37)
Albumin: 3.9 g/dL (ref 3.5–5.2)
Alkaline Phosphatase: 197 U/L — ABNORMAL HIGH (ref 39–117)
Chloride: 101 mEq/L (ref 96–112)
Creatinine, Ser: 1.25 mg/dL — ABNORMAL HIGH (ref 0.50–1.10)
Potassium: 4.1 mEq/L (ref 3.5–5.1)
Sodium: 140 mEq/L (ref 135–145)
Total Bilirubin: 0.7 mg/dL (ref 0.3–1.2)

## 2011-03-14 LAB — CBC
MCHC: 32.5 g/dL (ref 30.0–36.0)
Platelets: 185 10*3/uL (ref 150–400)
RDW: 15.6 % — ABNORMAL HIGH (ref 11.5–15.5)

## 2011-03-14 LAB — DIFFERENTIAL
Basophils Absolute: 0 10*3/uL (ref 0.0–0.1)
Basophils Relative: 1 % (ref 0–1)
Neutro Abs: 4.9 10*3/uL (ref 1.7–7.7)
Neutrophils Relative %: 74 % (ref 43–77)

## 2011-03-14 LAB — GLUCOSE, CAPILLARY

## 2011-03-14 MED ORDER — FUROSEMIDE 10 MG/ML IJ SOLN
80.0000 mg | Freq: Every day | INTRAMUSCULAR | Status: DC
  Administered 2011-03-15 – 2011-03-16 (×2): 80 mg via INTRAVENOUS
  Filled 2011-03-14 (×2): qty 8

## 2011-03-14 MED ORDER — ACETAMINOPHEN 325 MG PO TABS: 650.0000 mg | ORAL_TABLET | Freq: Four times a day (QID) | ORAL | Status: DC | PRN

## 2011-03-14 MED ORDER — ONDANSETRON HCL 4 MG PO TABS: 4.0000 mg | ORAL_TABLET | Freq: Four times a day (QID) | ORAL | Status: DC | PRN

## 2011-03-14 MED ORDER — NITROGLYCERIN 0.4 MG SL SUBL: 0.4000 mg | SUBLINGUAL_TABLET | SUBLINGUAL | Status: DC | PRN

## 2011-03-14 MED ORDER — ISOSORBIDE MONONITRATE ER 30 MG PO TB24
30.0000 mg | ORAL_TABLET | Freq: Every day | ORAL | Status: DC
  Administered 2011-03-15 – 2011-03-16 (×2): 30 mg via ORAL
  Filled 2011-03-14 (×4): qty 1

## 2011-03-14 MED ORDER — LISINOPRIL 2.5 MG PO TABS
2.5000 mg | ORAL_TABLET | Freq: Every day | ORAL | Status: DC
  Administered 2011-03-15 – 2011-03-16 (×2): 2.5 mg via ORAL
  Filled 2011-03-14 (×4): qty 1

## 2011-03-14 MED ORDER — ONDANSETRON HCL 4 MG/2ML IJ SOLN: 4.0000 mg | Freq: Four times a day (QID) | INTRAMUSCULAR | Status: DC | PRN

## 2011-03-14 MED ORDER — ENOXAPARIN SODIUM 40 MG/0.4ML ~~LOC~~ SOLN
40.0000 mg | Freq: Every day | SUBCUTANEOUS | Status: DC
  Administered 2011-03-15 – 2011-03-20 (×6): 40 mg via SUBCUTANEOUS
  Filled 2011-03-14 (×6): qty 0.4

## 2011-03-14 MED ORDER — FUROSEMIDE 10 MG/ML IJ SOLN
80.0000 mg | Freq: Once | INTRAMUSCULAR | Status: AC
  Administered 2011-03-14: 80 mg via INTRAVENOUS
  Filled 2011-03-14 (×2): qty 4

## 2011-03-14 MED ORDER — ASPIRIN 81 MG PO CHEW
81.0000 mg | CHEWABLE_TABLET | Freq: Every day | ORAL | Status: DC
  Administered 2011-03-15 – 2011-03-20 (×6): 81 mg via ORAL
  Filled 2011-03-14 (×6): qty 1

## 2011-03-14 MED ORDER — ACETAMINOPHEN 650 MG RE SUPP: 650.0000 mg | Freq: Four times a day (QID) | RECTAL | Status: DC | PRN

## 2011-03-14 MED ORDER — SPIRONOLACTONE 25 MG PO TABS
25.0000 mg | ORAL_TABLET | Freq: Every day | ORAL | Status: DC
  Administered 2011-03-15 – 2011-03-18 (×4): 25 mg via ORAL
  Filled 2011-03-14 (×5): qty 1

## 2011-03-14 NOTE — ED Notes (Signed)
4098-11 Ready

## 2011-03-14 NOTE — ED Notes (Signed)
Received pt after report to room 18, awaiting bed assignment. Will monitor pt. Pt denies any needs at this time. BSC given to pt

## 2011-03-14 NOTE — ED Provider Notes (Signed)
Pt presents with shortness of breath and dyspnea on exertion, worsening, despite taking her medications with the exception of lisinopril. Exam she is alert, cooperative, however, gets dyspneic with exertion of moving about in bed. Cardiac monitor shows rate 70-80 with frequent PVCs. Pulse palpates as a regular nonperfused PVCs. Pulse rate when seen is about 40. Patient with mild CHF exacerbation, recently admitted for stabilization. She has a defibrillator, but does not apparently have a pacemaking capability. She may need to be evaluated by electrophysiology. Cardiac echo repeat is indicated.  Robin Melter, MD 03/14/11 2116

## 2011-03-14 NOTE — H&P (Signed)
[name] is an 56 y.o. female.   Chief Complaint: breathlessness HPI: Shortness of breath started approximately 1 month ago.  She correlates this with the time she ran out of her Lisinopril 2.5mg  once daily tablets.  It also appears that she should be taking Lasix 40mg  BID, but her Rx say 40/20.  She runs out of Lasix prematurely bc of the BID dosing so often scales back to make it last.  She has been without continuity medical care for some time.  Dietary review also indicates that she made a Ham around new years and ate on this for a couple days.  Unfortuantely, diet appears to be quite salt laden.  No recent illnessess.  NO CP - last time she had was prior to her CABG.  Past Medical History  Diagnosis Date  . Coronary artery disease   . Diabetes mellitus   . Hypertension     Past Surgical History  Procedure Date  . Coronary artery bypass graft   . Cardiac catheterization     History reviewed. No pertinent family history. Social History:  reports that she has been smoking.  She does not have any smokeless tobacco history on file. She reports that she drinks alcohol. She reports that she does not use illicit drugs.  Allergies: No Known Allergies  Medications Prior to Admission  Medication Dose Route Frequency Provider Last Rate Last Dose  . furosemide (LASIX) injection 80 mg  80 mg Intravenous Once Rise Patience, PA   80 mg at 03/14/11 1934   Medications Prior to Admission  Medication Sig Dispense Refill  . aspirin 325 MG tablet Take 325 mg by mouth daily.        . furosemide (LASIX) 40 MG tablet Take 20-40 mg by mouth 2 (two) times daily. Take 1 tablet (40 MG) in the morning, and 0.5 tablet (20 MG) in the evening.      . isosorbide mononitrate (IMDUR) 30 MG 24 hr tablet Take 30 mg by mouth daily.        [name] lisinopril (PRINIVIL,ZESTRIL) 2.5 MG tablet Take 2.5 mg by mouth daily.        . nitroGLYCERIN (NITROSTAT) 0.4 MG SL tablet Place 0.4 mg under the tongue every 5 (five)  minutes as needed. For chest pain.      [name] spironolactone (ALDACTONE) 25 MG tablet Take 25 mg by mouth daily.          Results for orders placed during the hospital encounter of 03/14/11 (from the past 48 hour(s))  CBC     Status: Abnormal   Collection Time   03/14/11  7:18 PM      Component Value Range Comment   WBC 6.7  4.0 - 10.5 (K/uL)    RBC 4.23  3.87 - 5.11 (MIL/uL)    Hemoglobin 13.2  12.0 - 15.0 (g/dL)    HCT 47.8  29.5 - 62.1 (%)    MCV 96.0  78.0 - 100.0 (fL)    MCH 31.2  26.0 - 34.0 (pg)    MCHC 32.5  30.0 - 36.0 (g/dL)    RDW 30.8 (*) 65.7 - 15.5 (%)    Platelets 185  150 - 400 (K/uL)   DIFFERENTIAL     Status: Normal   Collection Time   03/14/11  7:18 PM      Component Value Range Comment   Neutrophils Relative 74  43 - 77 (%)    Neutro Abs 4.9  1.7 - 7.7 (K/uL)  Lymphocytes Relative 20  12 - 46 (%)    Lymphs Abs 1.4  0.7 - 4.0 (K/uL)    Monocytes Relative 5  3 - 12 (%)    Monocytes Absolute 0.3  0.1 - 1.0 (K/uL)    Eosinophils Relative 1  0 - 5 (%)    Eosinophils Absolute 0.1  0.0 - 0.7 (K/uL)    Basophils Relative 1  0 - 1 (%)    Basophils Absolute 0.0  0.0 - 0.1 (K/uL)   COMPREHENSIVE METABOLIC PANEL     Status: Abnormal   Collection Time   03/14/11  7:18 PM      Component Value Range Comment   Sodium 140  135 - 145 (mEq/L)    Potassium 4.1  3.5 - 5.1 (mEq/L)    Chloride 101  96 - 112 (mEq/L)    CO2 25  19 - 32 (mEq/L)    Glucose, Bld 98  70 - 99 (mg/dL)    BUN 15  6 - 23 (mg/dL)    Creatinine, Ser 1.61 (*) 0.50 - 1.10 (mg/dL)    Calcium 9.8  8.4 - 10.5 (mg/dL)    Total Protein 7.6  6.0 - 8.3 (g/dL)    Albumin 3.9  3.5 - 5.2 (g/dL)    AST 26  0 - 37 (U/L)    ALT 25  0 - 35 (U/L)    Alkaline Phosphatase 197 (*) 39 - 117 (U/L)    Total Bilirubin 0.7  0.3 - 1.2 (mg/dL)    GFR calc non Af Amer 48 (*) >90 (mL/min)    GFR calc Af Amer 56 (*) >90 (mL/min)   CK TOTAL AND CKMB     Status: Normal   Collection Time   03/14/11  7:18 PM      Component Value  Range Comment   Total CK 68  7 - 177 (U/L)    CK, MB 1.4  0.3 - 4.0 (ng/mL)    Relative Index RELATIVE INDEX IS INVALID  0.0 - 2.5    PRO B NATRIURETIC PEPTIDE     Status: Abnormal   Collection Time   03/14/11  7:19 PM      Component Value Range Comment   Pro B Natriuretic peptide (BNP) 3766.0 (*) 0 - 125 (pg/mL)   POCT I-STAT TROPONIN I     Status: Normal   Collection Time   03/14/11  8:27 PM      Component Value Range Comment   Troponin i, poc 0.01  0.00 - 0.08 (ng/mL)    Comment 3            TROPONIN I     Status: Normal   Collection Time   03/14/11  8:37 PM      Component Value Range Comment   Troponin I <0.30  <0.30 (ng/mL)    Dg Chest 2 View  03/14/2011  *RADIOLOGY REPORT*  Clinical Data: Shortness of breath.  CHEST - 2 VIEW  Comparison: Chest 09/29/2010.  Findings: There is cardiomegaly and vascular congestion without frank edema.  AICD is in place.  The patient is status post CABG and aortic valve replacement.  No focal airspace disease, pneumothorax or pleural effusion is identified.  IMPRESSION: Cardiomegaly and pulmonary vascular congestion.  Original Report Authenticated By: Bernadene Bell. Maricela Curet, M.D.    Review of Systems  Constitutional: Negative for fever, chills and weight loss.  HENT: Negative for congestion and sore throat.   Eyes: Negative for blurred vision.  Respiratory: Positive  for cough and shortness of breath.   Cardiovascular: Positive for orthopnea. Negative for chest pain, palpitations and leg swelling.  Gastrointestinal: Negative for nausea, vomiting, abdominal pain and diarrhea.  Genitourinary: Positive for dysuria.  Musculoskeletal: Negative for myalgias.  Neurological: Negative for dizziness and headaches.  Psychiatric/Behavioral: Negative for depression.    Blood pressure 104/66, pulse 67, temperature 98.9 F (37.2 C), temperature source Oral, resp. rate 16, height 5\' 4"  (1.626 m), weight 81.647 kg (180 lb), SpO2 98.00%. Physical Exam    Constitutional: She is oriented to person, place, and time. She appears well-developed and well-nourished.  HENT:  Head: Normocephalic.  Mouth/Throat: Oropharynx is clear and moist.  Eyes: Pupils are equal, round, and reactive to light.  Neck: JVD (JVP at least 20 mmHg H20, at ear lobes sitting at 75 degrees) present. No thyromegaly present.  Cardiovascular: Normal rate and regular rhythm.   Murmur heard.  Systolic murmur is present with a grade of 4/6   Diastolic murmur is present with a grade of 2/6  Pulses:      Radial pulses are 2+ on the right side.       Dorsalis pedis pulses are 2+ on the right side.  Respiratory: Effort normal.       Diminished bibasilar with occ Rales  GI: She exhibits no distension. There is no tenderness.  Musculoskeletal: She exhibits edema (trace pretibial).  Neurological: She is alert and oriented to person, place, and time.  Skin: Skin is warm and dry. No rash noted.  Psychiatric: She has a normal mood and affect. Her behavior is normal.     Assessment/Plan 55 yo here with an Acute on Chronic Systolic CHF exacerbation.  **AoCHF:  Appears likely from medication non-adherence and lack of continuity PMD.  Will admit and continue Lasix 80mg  IV and titrate based on UOP, goal should be at least 1.5-2 L net neg for the next 24 hours.  She definitely needs the afterload reduction with Lisinopril 2.5mg  daily.  Will also continue her Cleda Daub as she currently has NYHA Class III symptoms (breathless with walking ~ 10-15 feet).  Continue Imdur for now, but if BP an issue, titration of her ACEi takes priority.  Will have to investigate further into her beta blocker hx- apparently bradycardia?  She is having frequent PVCs and these could help with suppression of this.  **hx AFib: in NSR now.  MOnitor on Tele.  **Possible Hx of DM: will send of HGba1c  ROSE-JONES,Elmyra Banwart 03/14/2011, 10:23 PM

## 2011-03-14 NOTE — ED Notes (Signed)
Attempted to call report, nurse not available.

## 2011-03-14 NOTE — ED Notes (Signed)
The pt has had sob  For  2 months  She was admitted at that time with chf.  At present no acute respiratory distress

## 2011-03-14 NOTE — ED Notes (Signed)
Report called to [place], RN

## 2011-03-14 NOTE — ED Provider Notes (Signed)
History     CSN: [id number]  Arrival date & time 03/14/11  1719   First MD Initiated Contact with Patient 03/14/11 1843      Chief Complaint  Patient presents with  . Shortness of Breath    (Consider location/radiation/quality/duration/timing/severity/associated sxs/prior treatment) HPI Comments: Patient reports increasing SOB over one month with more extreme exacerbating x 1 week.  States she cannot walk 10 feet without becoming very short of breath, she can also not lie flat or talk for a long time without becoming SOB.  Has had a cough x 3 days, productive of clear sputum.  Feet have been swollen. Denies fevers, chest pain.  States this feels like previous CHF exacerbation.  Pt has a hx MI, CHF.   Patient is a 56 y.o. female presenting with shortness of breath. The history is provided by the patient.  Shortness of Breath  The current episode started more than 2 weeks ago. The onset was gradual. Associated symptoms include shortness of breath.    Past Medical History  Diagnosis Date  . Coronary artery disease   . Diabetes mellitus   . Hypertension     Past Surgical History  Procedure Date  . Coronary artery bypass graft     History reviewed. No pertinent family history.  History  Substance Use Topics  . Smoking status: Current Everyday Smoker  . Smokeless tobacco: Not on file  . Alcohol Use: Yes    OB History    Grav Para Term Preterm Abortions TAB SAB Ect Mult Living                  Review of Systems  Respiratory: Positive for shortness of breath.   Gastrointestinal: Negative for nausea, vomiting, abdominal pain and diarrhea.  All other systems reviewed and are negative.    Allergies  Review of patient's allergies indicates no known allergies.  Home Medications   Current Outpatient Rx  Name Route Sig Dispense Refill  . ASPIRIN 325 MG PO TABS Oral Take 325 mg by mouth daily.      . FUROSEMIDE 40 MG PO TABS Oral Take 20-40 mg by mouth 2 (two) times  daily. Take 1 tablet (40 MG) in the morning, and 0.5 tablet (20 MG) in the evening.    . ISOSORBIDE MONONITRATE ER 30 MG PO TB24 Oral Take 30 mg by mouth daily.      [name] LISINOPRIL 2.5 MG PO TABS Oral Take 2.5 mg by mouth daily.      [name] NITROGLYCERIN 0.4 MG SL SUBL Sublingual Place 0.4 mg under the tongue every 5 (five) minutes as needed. For chest pain.    [name] SPIRONOLACTONE 25 MG PO TABS Oral Take 25 mg by mouth daily.        BP 102/75  Pulse 84  Temp(Src) 97.8 F (36.6 C) (Oral)  Resp 18  Ht 5\' 4"  (1.626 m)  Wt 180 lb (81.647 kg)  BMI 30.90 kg/m2  SpO2 96%  Physical Exam  Nursing note and vitals reviewed. Constitutional: She is oriented to person, place, and time. She appears well-developed and well-nourished.  HENT:  Head: Normocephalic and atraumatic.  Neck: Neck supple.  Cardiovascular: Normal rate.  An irregular rhythm present.  Pulmonary/Chest: Breath sounds normal. No respiratory distress. She has no wheezes. She has no rales. She exhibits no tenderness.       Added work of breathing, especially with lying flat   Abdominal: Soft. Bowel sounds are normal. There is no tenderness.  Neurological:  She is alert and oriented to person, place, and time.  Psychiatric: She has a normal mood and affect. Her behavior is normal.    ED Course  Procedures (including critical care time)   Labs Reviewed  CBC  DIFFERENTIAL  COMPREHENSIVE METABOLIC PANEL  CK TOTAL AND CKMB  I-STAT TROPONIN I  PRO B NATRIURETIC PEPTIDE   Dg Chest 2 View  03/14/2011  *RADIOLOGY REPORT*  Clinical Data: Shortness of breath.  CHEST - 2 VIEW  Comparison: Chest 09/29/2010.  Findings: There is cardiomegaly and vascular congestion without frank edema.  AICD is in place.  The patient is status post CABG and aortic valve replacement.  No focal airspace disease, pneumothorax or pleural effusion is identified.  IMPRESSION: Cardiomegaly and pulmonary vascular congestion.  Original Report Authenticated By: Bernadene Bell.  D'ALESSIO, M.D.   9:04 PM Discussed patient with Dr Effie Shy, who will also see the patient.    Date: 03/14/2011  Rate: 77  Rhythm: normal sinus rhythm and premature ventricular contractions (PVC)  QRS Axis: normal  Intervals: normal  ST/T Wave abnormalities: normal +nonspecific T wave changes  Conduction Disutrbances:none  Narrative Interpretation:   Old EKG Reviewed: unchanged except for PVCs    1. CHF (congestive heart failure)       MDM  Patient p/w exacerbation of CHF - pt does not have outpatient f/u at this time.  Unable to lie flat or take 3 steps without becoming SOB.  80mg  Lasix given IV with no improvement.  Nitro not given due to patient's low blood pressure.  Patient also with frequent PVCs.  Admitted to [facility].          Dillard Cannon [place], [place] 03/14/11 2354

## 2011-03-14 NOTE — ED Notes (Signed)
IV attempted X1 unsuccessfully.

## 2011-03-15 ENCOUNTER — Other Ambulatory Visit: Payer: Self-pay

## 2011-03-15 LAB — COMPREHENSIVE METABOLIC PANEL
ALT: 22 U/L (ref 0–35)
Albumin: 3.5 g/dL (ref 3.5–5.2)
Alkaline Phosphatase: 185 U/L — ABNORMAL HIGH (ref 39–117)
Calcium: 9.8 mg/dL (ref 8.4–10.5)
GFR calc Af Amer: 64 mL/min — ABNORMAL LOW (ref >90)
Glucose, Bld: 79 mg/dL (ref 70–99)
Potassium: 3.1 mEq/L — ABNORMAL LOW (ref 3.5–5.1)
Sodium: 138 mEq/L (ref 135–145)
Total Protein: 7.3 g/dL (ref 6.0–8.3)

## 2011-03-15 LAB — CBC
HCT: 39.6 % (ref 36.0–46.0)
Hemoglobin: 12.6 g/dL (ref 12.0–15.0)
MCV: 96.1 fL (ref 78.0–100.0)
WBC: 5.6 10*3/uL (ref 4.0–10.5)

## 2011-03-15 LAB — GLUCOSE, CAPILLARY
Glucose-Capillary: 86 mg/dL (ref 70–99)
Glucose-Capillary: 136 mg/dL — ABNORMAL HIGH (ref 70–99)
Glucose-Capillary: 115 mg/dL — ABNORMAL HIGH (ref 70–99)
Glucose-Capillary: 117 mg/dL — ABNORMAL HIGH (ref 70–99)

## 2011-03-15 LAB — PROTIME-INR: INR: 1.39 (ref 0.00–1.49)

## 2011-03-15 MED ORDER — POTASSIUM CHLORIDE CRYS ER 20 MEQ PO TBCR
40.0000 meq | EXTENDED_RELEASE_TABLET | ORAL | Status: AC
  Administered 2011-03-15 (×2): 40 meq via ORAL
  Filled 2011-03-15 (×2): qty 2

## 2011-03-15 NOTE — Progress Notes (Signed)
Subjective: Chart reviewed. Dyspnea is improved. Still has dyspnea on exertion, walking to the bathroom. Denies chest pain.  Patient says she used to see Dr. Salena Saner, with [facility] heart and vascular until her Medicaid ran out and she was unable to afford going there.  Objective: Blood pressure 100/64, pulse 81, temperature 97.5 F (36.4 C), temperature source Oral, resp. rate 19, height 5\' 4"  (1.626 m), weight 181 lb 3.5 oz (82.2 kg), SpO2 97.00%.  Intake/Output Summary (Last 24 hours) at 03/15/11 1642 Last data filed at 03/15/11 1300  Gross per 24 hour  Intake    740 ml  Output   1250 ml  Net   -510 ml    General Exam: Comfortable. Lying almost supine in bed. Respiratory System: Occasional basal crackles but otherwise clear. No increased work of breathing. Cardiovascular System: First and second heart sounds heard. Regular rate and rhythm. JVD present but no murmurs. Telemetry shows sinus rhythm with PVCs. Single episode of 4 beat non-sustained ventricular tachycardia. Gastrointestinal System: Abdomen is non distended, soft and normal bowel sounds heard. Central Nervous System: Alert and oriented. No focal neurological deficits. Extremities: Trace bilateral ankle edema  Lab Results: Basic Metabolic Panel:  Basename 03/15/11 0500 03/14/11 1918  NA 138 140  K 3.1* 4.1  CL 100 101  CO2 24 25  GLUCOSE 79 98  BUN 16 15  CREATININE 1.12* 1.25*  CALCIUM 9.8 9.8  MG -- --  PHOS -- --   Liver Function Tests:  Basename 03/15/11 0500 03/14/11 1918  AST 22 26  ALT 22 25  ALKPHOS 185* 197*  BILITOT 0.9 0.7  PROT 7.3 7.6  ALBUMIN 3.5 3.9   No results found for this basename: LIPASE:2,AMYLASE:2 in the last 72 hours No results found for this basename: AMMONIA:2 in the last 72 hours CBC:  Basename 03/15/11 0500 03/14/11 1918  WBC 5.6 6.7  NEUTROABS -- 4.9  HGB 12.6 13.2  HCT 39.6 40.6  MCV 96.1 96.0  PLT 197 185   Cardiac Enzymes:  Basename 03/14/11 2037 03/14/11 1918    CKTOTAL -- 68  CKMB -- 1.4  CKMBINDEX -- --  TROPONINI <0.30 --   BNP:  Basename 03/14/11 1919  PROBNP 3766.0*   D-Dimer: No results found for this basename: DDIMER:2 in the last 72 hours CBG:  Basename 03/15/11 0528 03/14/11 2343  GLUCAP 86 116*   Coagulation:  Basename 03/15/11 0500  LABPROT 17.3*  INR 1.39   Urine Drug Screen: Drugs of Abuse     Component Value Date/Time   LABOPIA NEGATIVE 09/30/2010 1110   COCAINSCRNUR POSITIVE* 09/30/2010 1110   LABBENZ NEGATIVE 09/30/2010 1110   AMPHETMU NEGATIVE 09/30/2010 1110     Studies/Results: Dg Chest 2 View  03/14/2011  *RADIOLOGY REPORT*  Clinical Data: Shortness of breath.  CHEST - 2 VIEW  Comparison: Chest 09/29/2010.  IMPRESSION: Cardiomegaly and pulmonary vascular congestion.  Original Report Authenticated By: Bernadene Bell. Maricela Curet, M.D.    Medications: Scheduled Meds:   . aspirin  81 mg Oral Daily  . enoxaparin  40 mg Subcutaneous Daily  . furosemide  80 mg Intravenous Once  . furosemide  80 mg Intravenous Daily  . isosorbide mononitrate  30 mg Oral Daily  . lisinopril  2.5 mg Oral Daily  . potassium chloride  40 mEq Oral Q4H  . spironolactone  25 mg Oral Daily   Continuous Infusions:  PRN Meds:.acetaminophen, acetaminophen, nitroGLYCERIN, ondansetron (ZOFRAN) IV, ondansetron  Assessment/Plan:  1. Acute on chronic systolic congestive heart failure  secondary to medication noncompliance: Continue IV Lasix, low dose ACE inhibitors and spinal lactone and Imdur. Patient is diuresing. 2. Hypokalemia: Replete and follow daily BMPs. 3. Single episode of nonsustained ventricular tachycardia: Replete potassium to greater than 4 and keep magnesium greater than 2. Consider adding low-dose beta blockers when blood pressures tolerate. 4. Cardiomyopathy: Left ventricle ejection fraction was 20-25% with akinesis of the entire anterior septal, anterior, inferior septal and apical myocardium. Will followup repeat 2-D  echocardiogram. 5. Hypertension: Blood pressures are running soft. Monitor with current regimen. 6. ? History of A. fib documented in H&P: Currently in sinus rhythm. 7. ?  history of diabetes: CBGs look to be okay. Follow A1c.   [name] 03/15/2011, 4:42 PM

## 2011-03-15 NOTE — Plan of Care (Signed)
Problem: Limited Adherence to Nutrition-Related Recommendations (NB-1.6) Goal: Nutrition education Formal process to instruct or train a patient/client in a skill or to impart knowledge to help patients/clients voluntarily manage or modify food choices and eating behavior to maintain or improve health.  Outcome: Completed/Met Date Met:  03/15/11 RD consulted for diet education. Pt reports that she has had nutrition education in the past and has been told to stay away from salty foods, however she continues to consume these foods and is able to tell this RD which foods are high in salt. Provided emotional support and discussed the importance of following sodium/fluid restriction. Provided Heart Failure Nutrition Therapy Handout. Discussed ways to decrease sodium in diet. Expect fair compliance.  Pt eating 100% of Low Sodium diet. BMI 31.1. Pt is obese. No additional nutrition concerns at this time.

## 2011-03-15 NOTE — ED Provider Notes (Signed)
Medical screening examination/treatment/procedure(s) were conducted as a shared visit with non-physician practitioner(s) and myself.  I personally evaluated the patient during the encounter  Flint Melter, MD 03/15/11 1059

## 2011-03-16 LAB — CBC
HCT: 39.5 % (ref 36.0–46.0)
Hemoglobin: 12.8 g/dL (ref 12.0–15.0)
RBC: 4.13 MIL/uL (ref 3.87–5.11)
WBC: 5.1 10*3/uL (ref 4.0–10.5)

## 2011-03-16 LAB — BASIC METABOLIC PANEL
Chloride: 102 mEq/L (ref 96–112)
GFR calc Af Amer: 48 mL/min — ABNORMAL LOW (ref >90)
GFR calc non Af Amer: 42 mL/min — ABNORMAL LOW (ref >90)
Potassium: 4.2 mEq/L (ref 3.5–5.1)
Sodium: 136 mEq/L (ref 135–145)

## 2011-03-16 LAB — GLUCOSE, CAPILLARY: Glucose-Capillary: 104 mg/dL — ABNORMAL HIGH (ref 70–99)

## 2011-03-16 MED ORDER — ZOLPIDEM TARTRATE 5 MG PO TABS
5.0000 mg | ORAL_TABLET | Freq: Once | ORAL | Status: AC
  Administered 2011-03-16: 5 mg via ORAL
  Filled 2011-03-16: qty 1

## 2011-03-16 MED ORDER — METOPROLOL TARTRATE 12.5 MG HALF TABLET
12.5000 mg | ORAL_TABLET | Freq: Two times a day (BID) | ORAL | Status: DC
  Administered 2011-03-16: 12.5 mg via ORAL
  Filled 2011-03-16 (×5): qty 1

## 2011-03-16 MED ORDER — FUROSEMIDE 80 MG PO TABS
80.0000 mg | ORAL_TABLET | Freq: Every day | ORAL | Status: DC
  Administered 2011-03-17 – 2011-03-18 (×2): 80 mg via ORAL
  Filled 2011-03-16 (×2): qty 1

## 2011-03-16 NOTE — Progress Notes (Signed)
Subjective: Mild dyspnea on exertion, but feels much better compared to on admission. No chest pain. Says has been purchasing medications with $4 plan at pharmacies.  Patient says she has had coronary angiography, PCI and AICD placed.  Patient says she used to see Dr. Salena Saner, with [facility] heart and vascular until her Medicaid ran out and she was unable to afford going there.  Objective: Blood pressure 109/73, pulse 76, temperature 98.3 F (36.8 C), temperature source Oral, resp. rate 18, height 5\' 4"  (1.626 m), weight 82.6 kg (182 lb 1.6 oz), SpO2 96.00%.  Intake/Output Summary (Last 24 hours) at 03/16/11 1206 Last data filed at 03/16/11 0900  Gross per 24 hour  Intake    800 ml  Output    300 ml  Net    500 ml    General Exam: Comfortable. Lying almost supine in bed. Respiratory System: clear. No increased work of breathing. Cardiovascular System: First and second heart sounds heard. Regular rate and rhythm.No JVD/ murmurs. Telemetry shows sinus rhythm with PVCs. No further non-sustained ventricular tachycardia. Gastrointestinal System: Abdomen is non distended, soft and normal bowel sounds heard. Central Nervous System: Alert and oriented. No focal neurological deficits. Extremities: Trace bilateral ankle edema  Lab Results: Basic Metabolic Panel:  Basename 03/16/11 0630 03/15/11 1540 03/15/11 0500  NA 136 -- 138  K 4.2 -- 3.1*  CL 102 -- 100  CO2 24 -- 24  GLUCOSE 107* -- 79  BUN 22 -- 16  CREATININE 1.41* -- 1.12*  CALCIUM 9.6 -- 9.8  MG -- 2.2 --  PHOS -- -- --   Liver Function Tests:  Basename 03/15/11 0500 03/14/11 1918  AST 22 26  ALT 22 25  ALKPHOS 185* 197*  BILITOT 0.9 0.7  PROT 7.3 7.6  ALBUMIN 3.5 3.9   No results found for this basename: LIPASE:2,AMYLASE:2 in the last 72 hours No results found for this basename: AMMONIA:2 in the last 72 hours CBC:  Basename 03/16/11 0630 03/15/11 0500 03/14/11 1918  WBC 5.1 5.6 --  NEUTROABS -- -- 4.9  HGB 12.8  12.6 --  HCT 39.5 39.6 --  MCV 95.6 96.1 --  PLT 195 197 --   Cardiac Enzymes:  Basename 03/14/11 2037 03/14/11 1918  CKTOTAL -- 68  CKMB -- 1.4  CKMBINDEX -- --  TROPONINI <0.30 --   BNP:  Basename 03/14/11 1919  PROBNP 3766.0*   D-Dimer: No results found for this basename: DDIMER:2 in the last 72 hours CBG:  Basename 03/16/11 0603 03/15/11 2129 03/15/11 1653 03/15/11 1142 03/15/11 0528 03/14/11 2343  GLUCAP 104* 117* 115* 136* 86 116*   Coagulation:  Basename 03/15/11 0500  LABPROT 17.3*  INR 1.39   Urine Drug Screen: Drugs of Abuse     Component Value Date/Time   LABOPIA NEGATIVE 09/30/2010 1110   COCAINSCRNUR POSITIVE* 09/30/2010 1110   LABBENZ NEGATIVE 09/30/2010 1110   AMPHETMU NEGATIVE 09/30/2010 1110     Studies/Results: Dg Chest 2 View  03/14/2011  *RADIOLOGY REPORT*  Clinical Data: Shortness of breath.  CHEST - 2 VIEW  Comparison: Chest 09/29/2010.  IMPRESSION: Cardiomegaly and pulmonary vascular congestion.  Original Report Authenticated By: Bernadene Bell. Maricela Curet, M.D.    Medications: Scheduled Meds:    . aspirin  81 mg Oral Daily  . enoxaparin  40 mg Subcutaneous Daily  . furosemide  80 mg Intravenous Daily  . isosorbide mononitrate  30 mg Oral Daily  . lisinopril  2.5 mg Oral Daily  . potassium chloride  40  mEq Oral Q4H  . spironolactone  25 mg Oral Daily   Continuous Infusions:  PRN Meds:.acetaminophen, acetaminophen, nitroGLYCERIN, ondansetron (ZOFRAN) IV, ondansetron  Assessment/Plan:  1. Acute on chronic systolic congestive heart failure secondary to medication noncompliance: Improving. Change Lasix to oral, low dose ACE inhibitors and spironolactone and Imdur. Patient is diuresing. Repeat Echo shows LVEF 15-20% and diffuse hypo kinesis. Cardiology consulted for evaluation of worsened LV function and management. Mildly increased creatinine. Will change to oral lasix and monitor BMP in am. 2. Hypokalemia: repleted. Follow daily  BMPs. 3. Single episode of nonsustained ventricular tachycardia: Discussed with cardiology and will start Metoprolol 12.5 mg oral twice daily. 4. Cardiomyopathy: Left ventricle ejection fraction is worse. Await cardiology input. 5. Hypertension: controlled. 6. ? History of A. fib documented in H&P: Currently in sinus rhythm. 7. ?  history of diabetes: CBGs look to be okay. Follow A1c.  Will request Case Manager to assist with follow up with PCP at [facility].   Jozi Malachi 03/16/2011, 12:06 PM

## 2011-03-16 NOTE — Progress Notes (Signed)
After initial discussion with the patient she indicates that she has been discharged from [facility] due to nonpayment.  She has not had an opportunity to look for a new cardiologist.   Wilburt Finlay, [facility] 12:48 PM

## 2011-03-17 ENCOUNTER — Other Ambulatory Visit: Payer: Self-pay

## 2011-03-17 ENCOUNTER — Encounter (HOSPITAL_COMMUNITY): Payer: Self-pay | Admitting: Adult Health

## 2011-03-17 ENCOUNTER — Encounter (HOSPITAL_COMMUNITY)
Admission: EM | Disposition: A | Discharge status: Home / Self Care | Payer: Self-pay | Source: Home / Self Care | Attending: Internal Medicine

## 2011-03-17 DIAGNOSIS — I2510 Atherosclerotic heart disease of native coronary artery without angina pectoris: Secondary | ICD-10-CM

## 2011-03-17 DIAGNOSIS — F1910 Other psychoactive substance abuse, uncomplicated: Secondary | ICD-10-CM

## 2011-03-17 DIAGNOSIS — I5023 Acute on chronic systolic (congestive) heart failure: Principal | ICD-10-CM

## 2011-03-17 HISTORY — PX: LEFT AND RIGHT HEART CATHETERIZATION WITH CORONARY/GRAFT ANGIOGRAM: SHX5448

## 2011-03-17 LAB — CBC
HCT: 39.3 % (ref 36.0–46.0)
Hemoglobin: 12.9 g/dL (ref 12.0–15.0)
MCH: 30.9 pg (ref 26.0–34.0)
RBC: 4.17 MIL/uL (ref 3.87–5.11)

## 2011-03-17 LAB — BASIC METABOLIC PANEL
CO2: 20 mEq/L (ref 19–32)
Calcium: 9.7 mg/dL (ref 8.4–10.5)
Chloride: 101 mEq/L (ref 96–112)
Potassium: 4 mEq/L (ref 3.5–5.1)
Sodium: 136 mEq/L (ref 135–145)

## 2011-03-17 LAB — PROTIME-INR: Prothrombin Time: 16.4 seconds — ABNORMAL HIGH (ref 11.6–15.2)

## 2011-03-17 LAB — CREATININE, SERUM
Creatinine, Ser: 1.52 mg/dL — ABNORMAL HIGH (ref 0.50–1.10)
GFR calc non Af Amer: 38 mL/min — ABNORMAL LOW (ref >90)

## 2011-03-17 SURGERY — LEFT AND RIGHT HEART CATHETERIZATION WITH CORONARY/GRAFT ANGIOGRAM
Anesthesia: LOCAL

## 2011-03-17 MED ORDER — MIDAZOLAM HCL 2 MG/2ML IJ SOLN
INTRAMUSCULAR | Status: AC
  Filled 2011-03-17: qty 2

## 2011-03-17 MED ORDER — LISINOPRIL 2.5 MG PO TABS
2.5000 mg | ORAL_TABLET | Freq: Every day | ORAL | Status: DC
  Administered 2011-03-17: 2.5 mg via ORAL
  Filled 2011-03-17 (×3): qty 1

## 2011-03-17 MED ORDER — ACETAMINOPHEN 325 MG PO TABS: 650.0000 mg | ORAL_TABLET | ORAL | Status: DC | PRN

## 2011-03-17 MED ORDER — HEPARIN SODIUM (PORCINE) 5000 UNIT/ML IJ SOLN
5000.0000 [IU] | Freq: Three times a day (TID) | INTRAMUSCULAR | Status: DC
  Administered 2011-03-17 – 2011-03-18 (×2): 5000 [IU] via SUBCUTANEOUS

## 2011-03-17 MED ORDER — ONDANSETRON HCL 4 MG/2ML IJ SOLN: 4.0000 mg | Freq: Four times a day (QID) | INTRAMUSCULAR | Status: DC | PRN

## 2011-03-17 MED ORDER — FENTANYL CITRATE 0.05 MG/ML IJ SOLN
INTRAMUSCULAR | Status: AC
  Filled 2011-03-17: qty 2

## 2011-03-17 MED ORDER — SODIUM CHLORIDE 0.9 % IV SOLN: INTRAVENOUS | Status: DC

## 2011-03-17 MED ORDER — DIAZEPAM 5 MG PO TABS
5.0000 mg | ORAL_TABLET | ORAL | Status: DC
  Filled 2011-03-17: qty 1

## 2011-03-17 MED ORDER — HEPARIN (PORCINE) IN NACL 2-0.9 UNIT/ML-% IJ SOLN
INTRAMUSCULAR | Status: AC
  Filled 2011-03-17: qty 2000

## 2011-03-17 MED ORDER — SODIUM CHLORIDE 0.9 % IJ SOLN
3.0000 mL | Freq: Two times a day (BID) | INTRAMUSCULAR | Status: DC
  Administered 2011-03-17 – 2011-03-20 (×6): 3 mL via INTRAVENOUS

## 2011-03-17 MED ORDER — ISOSORBIDE MONONITRATE 15 MG HALF TABLET
15.0000 mg | ORAL_TABLET | Freq: Every day | ORAL | Status: DC
  Administered 2011-03-17 – 2011-03-18 (×2): 15 mg via ORAL
  Filled 2011-03-17 (×2): qty 1

## 2011-03-17 MED ORDER — DIGOXIN 125 MCG PO TABS
0.1250 mg | ORAL_TABLET | Freq: Every day | ORAL | Status: DC
  Administered 2011-03-17 – 2011-03-20 (×4): 0.125 mg via ORAL
  Filled 2011-03-17 (×6): qty 1

## 2011-03-17 MED ORDER — SODIUM CHLORIDE 0.9 % IV SOLN: 250.0000 mL | INTRAVENOUS | Status: DC | PRN

## 2011-03-17 MED ORDER — CARVEDILOL 3.125 MG PO TABS
3.1250 mg | ORAL_TABLET | Freq: Two times a day (BID) | ORAL | Status: DC
  Administered 2011-03-17 – 2011-03-20 (×7): 3.125 mg via ORAL
  Filled 2011-03-17 (×9): qty 1

## 2011-03-17 MED ORDER — SODIUM CHLORIDE 0.9 % IJ SOLN: 3.0000 mL | INTRAMUSCULAR | Status: DC | PRN

## 2011-03-17 MED ORDER — ASPIRIN 81 MG PO CHEW
324.0000 mg | CHEWABLE_TABLET | ORAL | Status: DC
  Filled 2011-03-17: qty 4

## 2011-03-17 MED ORDER — LIDOCAINE HCL (PF) 1 % IJ SOLN
INTRAMUSCULAR | Status: AC
  Filled 2011-03-17: qty 30

## 2011-03-17 NOTE — H&P (View-Only) (Signed)
Subjective: Mild dyspnea on exertion.  Patient says she has had coronary angiography, PCI and AICD placed.  Patient says she used to see Dr. Salena Saner, with [facility] heart and vascular until her Medicaid ran out and she was unable to afford going there.  Objective: Blood pressure 98/66, pulse 65, temperature 97.6 F (36.4 C), temperature source Oral, resp. rate 18, height 5\' 4"  (1.626 m), weight 82.3 kg (181 lb 7 oz), SpO2 100.00%.  Intake/Output Summary (Last 24 hours) at 03/17/11 1148 Last data filed at 03/17/11 0846  Gross per 24 hour  Intake    720 ml  Output   1650 ml  Net   -930 ml    General Exam: Comfortable. Lying almost supine in bed. Respiratory System: clear. No increased work of breathing. Cardiovascular System: First and second heart sounds heard. Regular rate and rhythm.No JVD/ murmurs. Telemetry shows sinus rhythm. No further non-sustained ventricular tachycardia. Gastrointestinal System: Abdomen is non distended, soft and normal bowel sounds heard. Central Nervous System: Alert and oriented. No focal neurological deficits. Extremities: Trace bilateral ankle edema  Lab Results: Basic Metabolic Panel:  Basename 03/17/11 0625 03/16/11 0630 03/15/11 1540  NA 136 136 --  K 4.0 4.2 --  CL 101 102 --  CO2 20 24 --  GLUCOSE 106* 107* --  BUN 26* 22 --  CREATININE 1.19* 1.41* --  CALCIUM 9.7 9.6 --  MG -- -- 2.2  PHOS -- -- --   Liver Function Tests:  Basename 03/15/11 0500 03/14/11 1918  AST 22 26  ALT 22 25  ALKPHOS 185* 197*  BILITOT 0.9 0.7  PROT 7.3 7.6  ALBUMIN 3.5 3.9   No results found for this basename: LIPASE:2,AMYLASE:2 in the last 72 hours No results found for this basename: AMMONIA:2 in the last 72 hours CBC:  Basename 03/16/11 0630 03/15/11 0500 03/14/11 1918  WBC 5.1 5.6 --  NEUTROABS -- -- 4.9  HGB 12.8 12.6 --  HCT 39.5 39.6 --  MCV 95.6 96.1 --  PLT 195 197 --   Cardiac Enzymes:  Basename 03/14/11 2037 03/14/11 1918  CKTOTAL --  68  CKMB -- 1.4  CKMBINDEX -- --  TROPONINI <0.30 --   BNP:  Basename 03/14/11 1919  PROBNP 3766.0*   D-Dimer: No results found for this basename: DDIMER:2 in the last 72 hours CBG:  Basename 03/16/11 1616 03/16/11 1122 03/16/11 0603 03/15/11 2129 03/15/11 1653 03/15/11 1142  GLUCAP 133* 138* 104* 117* 115* 136*   Coagulation:  Basename 03/15/11 0500  LABPROT 17.3*  INR 1.39   Urine Drug Screen: Drugs of Abuse     Component Value Date/Time   LABOPIA NEGATIVE 09/30/2010 1110   COCAINSCRNUR POSITIVE* 09/30/2010 1110   LABBENZ NEGATIVE 09/30/2010 1110   AMPHETMU NEGATIVE 09/30/2010 1110     Studies/Results: Dg Chest 2 View  03/14/2011  *RADIOLOGY REPORT*  Clinical Data: Shortness of breath.  CHEST - 2 VIEW  Comparison: Chest 09/29/2010.  IMPRESSION: Cardiomegaly and pulmonary vascular congestion.  Original Report Authenticated By: Bernadene Bell. Maricela Curet, M.D.    Medications: Scheduled Meds:    . aspirin  81 mg Oral Daily  . carvedilol  3.125 mg Oral BID WC  . enoxaparin  40 mg Subcutaneous Daily  . furosemide  80 mg Oral Daily  . isosorbide mononitrate  15 mg Oral Daily  . lisinopril  2.5 mg Oral Daily  . spironolactone  25 mg Oral Daily  . zolpidem  5 mg Oral Once  . DISCONTD: furosemide  80  mg Intravenous Daily  . DISCONTD: isosorbide mononitrate  30 mg Oral Daily  . DISCONTD: lisinopril  2.5 mg Oral Daily  . DISCONTD: metoprolol tartrate  12.5 mg Oral BID   Continuous Infusions:  PRN Meds:.acetaminophen, acetaminophen, nitroGLYCERIN, ondansetron (ZOFRAN) IV, ondansetron  Assessment/Plan:  1. Acute on chronic systolic congestive heart failure secondary to medication noncompliance: Improving. Continue Lasix orally, low dose ACE inhibitors, spironolactone. We'll reduce Imdur to 15 mg daily secondary to soft blood pressures Change metoprolol to carvedilol. Heart failure team has been consulted for further recommendations. 2. Hypokalemia: repleted. Follow daily  BMPs. 3. Single episode of nonsustained ventricular tachycardia: Change metoprolol to carvedilol. Patient is status post AICD. 4. Cardiomyopathy: Left ventricle ejection fraction is worse. Await cardiology input. 5. Hypertension: Blood pressures are soft. Management as per problem #1. 6. ? History of A. fib documented in H&P: Currently in sinus rhythm. 7. ?  history of diabetes: CBGs look to be okay. Follow A1c.  Patient has an appointment arranged for health service on discharge. We'll still need to plan for medications on discharge, probably through $4 plans through pharmacies versus through health service versus thru heart failure clinic   [facility] 03/17/2011, 11:48 AM

## 2011-03-17 NOTE — Progress Notes (Signed)
03/17/11 Nursing 1910 Patient return to unit from cath lab. Patient is stable. No complaints of pain. No bleeding at the site. Right femoral site is a level 0. Will continue to monitor patient for further changes. Ernesta Amble, RN

## 2011-03-17 NOTE — Progress Notes (Signed)
Subjective: Mild dyspnea on exertion.  Patient says she has had coronary angiography, PCI and AICD placed.  Patient says she used to see [name], with [facility] heart and vascular until her Medicaid ran out and she was unable to afford going there.  Objective: Blood pressure 98/66, pulse 65, temperature 97.6 F (36.4 Fields), temperature source Oral, resp. rate 18, height 5' 4" (1.626 m), weight 82.3 kg (181 lb 7 oz), SpO2 100.00%.  Intake/Output Summary (Last 24 hours) at 03/17/11 1148 Last data filed at 03/17/11 0846  Gross per 24 hour  Intake    720 ml  Output   1650 ml  Net   -930 ml    General Exam: Comfortable. Lying almost supine in bed. Respiratory System: clear. No increased work of breathing. Cardiovascular System: First and second heart sounds heard. Regular rate and rhythm.No JVD/ murmurs. Telemetry shows sinus rhythm. No further non-sustained ventricular tachycardia. Gastrointestinal System: Abdomen is non distended, soft and normal bowel sounds heard. Central Nervous System: Alert and oriented. No focal neurological deficits. Extremities: Trace bilateral ankle edema  Lab Results: Basic Metabolic Panel:  Basename 03/17/11 0625 03/16/11 0630 03/15/11 1540  NA 136 136 --  K 4.0 4.2 --  CL 101 102 --  CO2 20 24 --  GLUCOSE 106* 107* --  BUN 26* 22 --  CREATININE 1.19* 1.41* --  CALCIUM 9.7 9.6 --  MG -- -- 2.2  PHOS -- -- --   Liver Function Tests:  Basename 03/15/11 0500 03/14/11 1918  AST 22 26  ALT 22 25  ALKPHOS 185* 197*  BILITOT 0.9 0.7  PROT 7.3 7.6  ALBUMIN 3.5 3.9   No results found for this basename: LIPASE:2,AMYLASE:2 in the last 72 hours No results found for this basename: AMMONIA:2 in the last 72 hours CBC:  Basename 03/16/11 0630 03/15/11 0500 03/14/11 1918  WBC 5.1 5.6 --  NEUTROABS -- -- 4.9  HGB 12.8 12.6 --  HCT 39.5 39.6 --  MCV 95.6 96.1 --  PLT 195 197 --   Cardiac Enzymes:  Basename 03/14/11 2037 03/14/11 1918  CKTOTAL --  68  CKMB -- 1.4  CKMBINDEX -- --  TROPONINI <0.30 --   BNP:  Basename 03/14/11 1919  PROBNP 3766.0*   D-Dimer: No results found for this basename: DDIMER:2 in the last 72 hours CBG:  Basename 03/16/11 1616 03/16/11 1122 03/16/11 0603 03/15/11 2129 03/15/11 1653 03/15/11 1142  GLUCAP 133* 138* 104* 117* 115* 136*   Coagulation:  Basename 03/15/11 0500  LABPROT 17.3*  INR 1.39   Urine Drug Screen: Drugs of Abuse     Component Value Date/Time   LABOPIA NEGATIVE 09/30/2010 1110   COCAINSCRNUR POSITIVE* 09/30/2010 1110   LABBENZ NEGATIVE 09/30/2010 1110   AMPHETMU NEGATIVE 09/30/2010 1110     Studies/Results: Dg Chest 2 View  03/14/2011  *RADIOLOGY REPORT*  Clinical Data: Shortness of breath.  CHEST - 2 VIEW  Comparison: Chest 09/29/2010.  IMPRESSION: Cardiomegaly and pulmonary vascular congestion.  Original Report Authenticated By: [name], M.D.    Medications: Scheduled Meds:    . aspirin  81 mg Oral Daily  . carvedilol  3.125 mg Oral BID WC  . enoxaparin  40 mg Subcutaneous Daily  . furosemide  80 mg Oral Daily  . isosorbide mononitrate  15 mg Oral Daily  . lisinopril  2.5 mg Oral Daily  . spironolactone  25 mg Oral Daily  . zolpidem  5 mg Oral Once  . DISCONTD: furosemide  80   mg Intravenous Daily  . DISCONTD: isosorbide mononitrate  30 mg Oral Daily  . DISCONTD: lisinopril  2.5 mg Oral Daily  . DISCONTD: metoprolol tartrate  12.5 mg Oral BID   Continuous Infusions:  PRN Meds:.acetaminophen, acetaminophen, nitroGLYCERIN, ondansetron (ZOFRAN) IV, ondansetron  Assessment/Plan:  1. Acute on chronic systolic congestive heart failure secondary to medication noncompliance: Improving. Continue Lasix orally, low dose ACE inhibitors, spironolactone. We'll reduce Imdur to 15 mg daily secondary to soft blood pressures Change metoprolol to carvedilol. Heart failure team has been consulted for further recommendations. 2. Hypokalemia: repleted. Follow daily  BMPs. 3. Single episode of nonsustained ventricular tachycardia: Change metoprolol to carvedilol. Patient is status post AICD. 4. Cardiomyopathy: Left ventricle ejection fraction is worse. Await cardiology input. 5. Hypertension: Blood pressures are soft. Management as per problem #1. 6. ? History of A. fib documented in H&P: Currently in sinus rhythm. 7. ?  history of diabetes: CBGs look to be okay. Follow A1c.  Patient has an appointment arranged for health service on discharge. We'll still need to plan for medications on discharge, probably through $4 plans through pharmacies versus through health service versus thru heart failure clinic   [name] 03/17/2011, 11:48 AM 

## 2011-03-17 NOTE — Progress Notes (Signed)
[facility] APPT ARRANGED FOR [facility] ON FEB 1ST AT 1150AM AND A DR'S APPT WITH DR. [facility] FEB 28TH AT 915AMMarland Kitchen Willa Rough 03/17/2011 [phone] OR [phone]

## 2011-03-17 NOTE — Progress Notes (Signed)
Pt. BP was 79/55 pulse 61,and asymptomatic, when getting ready to administer digoxin(administered med once received by pharmacy)... Contacted provider Legrand Como, NP) and by the time she called, noticed BP cuff had lowered on patient's arm. Fixed it, and BP increased to 85/61, pulse 67, and still asymptomatic. Provider stated to go ahead and administer it and just continue to monitor patient.

## 2011-03-17 NOTE — Consults (Signed)
Advanced Heart Failure  Referring Physician: Dr Waymon Amato Primary Physician: None Primary Cardiologist: None Reason for Consultation: Systolic Heart Failure   HPI:  56 year old female with history of HTN, DM2, polysubstance abuse (ETOH, tobacco, cocaine) and PAD. 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 At time of cath LAD, LCX and RCA occluded.  EF 45%. Had abrupt stent occlusion the next day and had to go back to the lab. Had PCI of LAD and then underwent CABG x 5 with mitral valve annuloplasty. Has not been cathed since.  Followed by [facility]. ECHO 2011 EF 20-25% -> ICD placed.  Has not been compliant with meds and has been unable to pay for her card so d/c'd from [facility] in May 2012. Told to estbalish with another cardiologist but has not done so. Last admitted for CHF in July 2012. Cocaine + at that time. Continues to use cocaine at least 1x/month.   She was admitted 03/14/11 due to ADHF with Pro-BNP 3766. Apparently ran out of lisinopril. Still had lasix and spiro. Has not been on b-blocker due to cocaine use. Has been started on IV lasix without much change in her weight. Echo 1/13 EF 15-20%.   Says she is markedly dyspneic with almost any activity. Comfortable at rest though. No chest. No orthopnea or PND. No LE edema. +abdominal swelling.  Typically drinks 40 oz of beer and smokes a few cigarettes and uses some cocaine 1x/month.   Says she has not used ETOH, cocaine or tobacco since [facility].   Review of Systems:     Cardiac Review of Systems: {Y] = yes [ ]  = no  Chest Pain [    ]  Resting SOB [ Y  ] Exertional SOB  [Y  ]  Orthopnea [  ]   Pedal Edema [   ]    Palpitations [  ] Syncope  [  ]   Presyncope [   ]  General Review of Systems: [Y] = yes [  ]=no Constitional: recent weight change [  ]; anorexia [  ]; fatigue [ Y ]; nausea [  ]; night sweats [  ]; fever [  ]; or chills [  ];                                                                                                                                           Dental: poor dentition[  ]; Last Dentist visit:   Eye : blurred vision [  ]; diplopia [   ]; vision changes [  ];  Amaurosis fugax[  ]; Resp: cough [  ];  wheezing[  ];  hemoptysis[  ]; shortness of breath[ Y ]; paroxysmal nocturnal dyspnea[  ]; dyspnea on exertion[  Y]; or orthopnea[  ];  GI:  gallstones[  ], vomiting[  ];  dysphagia[  ];  melena[  ];  hematochezia [  ]; heartburn[  ];   Hx of  Colonoscopy[  ]; GU: kidney stones [  ]; hematuria[  ];   dysuria [  ];  nocturia[  ];  history of     obstruction [  ];                 Skin: rash, swelling[  ];, hair loss[  ];  peripheral edema[  ];  or itching[  ]; Musculosketetal: myalgias[  ];  joint swelling[  ];  joint erythema[  ];  joint pain[  ];  back pain[  ];  Heme/Lymph: bruising[  ];  bleeding[  ];  anemia[  ];  Neuro: TIA[  ];  headaches[  ];  stroke[  ];  vertigo[  ];  seizures[  ];   paresthesias[  ];  difficulty walking[  ];  Psych:depression[  ]; anxiety[  ];  Endocrine: diabetes[  ];  thyroid dysfunction[  ];  Immunizations: Flu [  ]; Pneumococcal[  ];  Other:  Past Medical History  Diagnosis Date  . Coronary artery disease   . Diabetes mellitus   . Hypertension    * Tobacco use  * ETOH use  * Systolic HF  * PAD s/p previous embolectomy/fasciotomy  * Cocaine use  * Noncompliance  Medications Prior to Admission  Medication Dose Route Frequency Provider Last Rate Last Dose  . acetaminophen (TYLENOL) tablet 650 mg  650 mg Oral Q6H PRN Lisa Rose-Jones       Or  . acetaminophen (TYLENOL) suppository 650 mg  650 mg Rectal Q6H PRN Lisa Rose-Jones      . aspirin chewable tablet 81 mg  81 mg Oral Daily Lisa Rose-Jones   81 mg at 03/17/11 1012  . carvedilol (COREG) tablet 3.125 mg  3.125 mg Oral BID WC Anand Hongalgi      . enoxaparin (LOVENOX) injection 40 mg  40 mg Subcutaneous Daily Lisa Rose-Jones   40 mg at 03/17/11 1012  . furosemide  (LASIX) injection 80 mg  80 mg Intravenous Once Rise Patience, PA   80 mg at 03/14/11 1934  . furosemide (LASIX) tablet 80 mg  80 mg Oral Daily Anand Hongalgi   80 mg at 03/17/11 1030  . isosorbide mononitrate (IMDUR) 24 hr tablet 15 mg  15 mg Oral Daily Anand Hongalgi      . lisinopril (PRINIVIL,ZESTRIL) tablet 2.5 mg  2.5 mg Oral Daily Anand Hongalgi      . nitroGLYCERIN (NITROSTAT) SL tablet 0.4 mg  0.4 mg Sublingual Q5 min PRN Lisa Rose-Jones      . ondansetron (ZOFRAN) tablet 4 mg  4 mg Oral Q6H PRN Lisa Rose-Jones       Or  . ondansetron (ZOFRAN) injection 4 mg  4 mg Intravenous Q6H PRN Lisa Rose-Jones      . potassium chloride SA (K-DUR,KLOR-CON) CR tablet 40 mEq  40 mEq Oral Q4H Anand Hongalgi   40 mEq at 03/15/11 2000  . spironolactone (ALDACTONE) tablet 25 mg  25 mg Oral Daily Lisa Rose-Jones   25 mg at 03/17/11 1030  . zolpidem (AMBIEN) tablet 5 mg  5 mg Oral Once Rolan Lipa   5 mg at 03/16/11 2337  . DISCONTD: furosemide (LASIX) injection 80 mg  80 mg Intravenous Daily Lisa Rose-Jones   80 mg at 03/16/11 1021  . DISCONTD: isosorbide mononitrate (IMDUR) 24 hr tablet 30 mg  30 mg Oral Daily Lisa Rose-Jones  30 mg at 03/16/11 1021  . DISCONTD: lisinopril (PRINIVIL,ZESTRIL) tablet 2.5 mg  2.5 mg Oral Daily Lisa Rose-Jones   2.5 mg at 03/16/11 1020  . DISCONTD: metoprolol tartrate (LOPRESSOR) tablet 12.5 mg  12.5 mg Oral BID Anand Hongalgi   12.5 mg at 03/16/11 1518   Medications Prior to Admission  Medication Sig Dispense Refill  . aspirin 325 MG tablet Take 325 mg by mouth daily.        . furosemide (LASIX) 40 MG tablet Take 20-40 mg by mouth 2 (two) times daily. Take 1 tablet (40 MG) in the morning, and 0.5 tablet (20 MG) in the evening.      . isosorbide mononitrate (IMDUR) 30 MG 24 hr tablet Take 30 mg by mouth daily.        [name] lisinopril (PRINIVIL,ZESTRIL) 2.5 MG tablet Take 2.5 mg by mouth daily.        . nitroGLYCERIN (NITROSTAT) 0.4 MG SL tablet Place 0.4 mg under  the tongue every 5 (five) minutes as needed. For chest pain.      [name] spironolactone (ALDACTONE) 25 MG tablet Take 25 mg by mouth daily.              [name] aspirin  81 mg Oral Daily  . carvedilol  3.125 mg Oral BID WC  . enoxaparin  40 mg Subcutaneous Daily  . furosemide  80 mg Oral Daily  . isosorbide mononitrate  15 mg Oral Daily  . lisinopril  2.5 mg Oral Daily  . spironolactone  25 mg Oral Daily  . zolpidem  5 mg Oral Once  . DISCONTD: furosemide  80 mg Intravenous Daily  . DISCONTD: isosorbide mononitrate  30 mg Oral Daily  . DISCONTD: lisinopril  2.5 mg Oral Daily  . DISCONTD: metoprolol tartrate  12.5 mg Oral BID    Infusions:    No Known Allergies  History   Social History  . Marital Status: Widowed    Spouse Name: N/A    Number of Children: N/A  . Years of Education: N/A   Occupational History  . disable    Social History Main Topics  . Smoking status: Current Some Day Smoker  . Smokeless tobacco: Not on file  . Alcohol Use: Yes     Had some drinks New Years, describes social drinkingC  . Drug Use: No     Has history of cocaine use, denies recent  . Sexually Active:    Other Topics Concern  . Not on file   Social History Narrative  . No narrative on file   .SOC Family History  Problem Relation Age of Onset  . Heart attack Mother     Died age 45  . Diabetes Maternal Grandmother   . Heart attack Cousin   . Heart attack Maternal Uncle    PHYSICAL EXAM: Filed Vitals:   03/17/11 1031  BP: 98/66  Pulse: 65  Temp:   Resp:      Intake/Output Summary (Last 24 hours) at 03/17/11 1151 Last data filed at 03/17/11 0846  Gross per 24 hour  Intake    720 ml  Output   1650 ml  Net   -930 ml    General: Chronically ill  appearing. No respiratory difficulty HEENT: normal Neck: supple. JVP to jaw. Carotids 2+ bilat; no bruits. No lymphadenopathy or thryomegaly appreciated. Cor: PMI laterally displaced. Regular rate & rhythm. 2/6 SEM RSB. Loud  S3 Lungs: clear Abdomen: soft, nontender, + distended. No hepatosplenomegaly. No bruits  or masses. Good bowel sounds. Extremities: no cyanosis, clubbing, rash, edema. +fasciotomy wound RLE Neuro: alert & oriented x 3, cranial nerves grossly intact. moves all 4 extremities w/o difficulty. Affect pleasant.  ECG: NSR with PVCs. IVCD inferolateral TWI.   Results for orders placed during the hospital encounter of 03/14/11 (from the past 24 hour(s))  GLUCOSE, CAPILLARY     Status: Abnormal   Collection Time   03/16/11  4:16 PM      Component Value Range   Glucose-Capillary 133 (*) 70 - 99 (mg/dL)   Comment 1 Documented in Chart     Comment 2 Notify RN    BASIC METABOLIC PANEL     Status: Abnormal   Collection Time   03/17/11  6:25 AM      Component Value Range   Sodium 136  135 - 145 (mEq/L)   Potassium 4.0  3.5 - 5.1 (mEq/L)   Chloride 101  96 - 112 (mEq/L)   CO2 20  19 - 32 (mEq/L)   Glucose, Bld 106 (*) 70 - 99 (mg/dL)   BUN 26 (*) 6 - 23 (mg/dL)   Creatinine, Ser 4.54 (*) 0.50 - 1.10 (mg/dL)   Calcium 9.7  8.4 - 09.8 (mg/dL)   GFR calc non Af Amer 51 (*) >90 (mL/min)   GFR calc Af Amer 59 (*) >90 (mL/min)   No results found.   ASSESSMENT: 1. A/C systolic heart failure EF 15-20% with probable low output physiology and NYHA IIIb symptoms 2. NSVT 3. CAD s/p CABG 2005 with MVR       --s/p anterior MI 4. Polysubstance abuse 5. Noncompliance 6. HTN    PLAN/DISCUSSION:  Ms. Langston has advanced HF symptoms and I suspect low output physiology. Her EF has declined dramatically over the past few years which is likely due to medication non-compliance and polysubstance abuse but cannot exclude progression of her CAD. We had a long talk about her situation and how sick I think she is. We also discussed the need for compliance with office visits and medicines as well as the need for complete abstinence from polysubstance abuse. We will plan R and L HC today. She may need inotropic  support to facilitate diuresis. She will need close f/u in the HF clinic.   Daniel Bensimhon,MD 4:19 PM

## 2011-03-17 NOTE — Progress Notes (Signed)
   CARE MANAGEMENT NOTE 03/17/2011  Patient:  [name], [name]   Account Number:  [id number]  Date Initiated:  03/17/2011  Documentation initiated by:  Kathryn Cosby  Subjective/Objective Assessment:   PT WAS ADMITTED WITH CHF.     Action/Plan:   PROGRESSION OF CARE AND DISCHARGE PLANNING   Anticipated DC Date:  03/18/2011   Anticipated DC Plan:  HOME W HOME HEALTH SERVICES      DC Planning Services  CM consult      Choice offered to / List presented to:             Status of service:  In process, will continue to follow Medicare Important Message given?   (If response is "NO", the following Medicare IM given date fields will be blank) Date Medicare IM given:   Date Additional Medicare IM given:    Discharge Disposition:    Per UR Regulation:  Reviewed for med. necessity/level of care/duration of stay  Comments:  03/16/2011 Onnie Boer, RN, BSN 1650 PT WAS ADMITTED WITH CHF.  PT WAS FOLLOWED [facility]IL HER HUSBAND DIED AND SHE LOST HER INSURANCE.  SHE HAS A BAL AT CARDIOLOGIST OFFICE AND COULD NOT BE SEEN.  PT HAS [facility] APPT ARRANGED, SHE WILL BE FOLLOWED AT THE HEART FAILURE CLINIC AND HAS BEEN SET UP WITH [facility] FOR HEALTH MANAGEMENT.  FINANCIAL COUNCILING HAS BEEN CONTACTED ALSO AS SHE HAS 2 CHILDREN UNDER 18 YRS OLD. PT STATES THAT SHE WILL BE ABLE TO AFFORD MEDS ON $4 LIST. WILL F/U ON OTHER NEEDS.

## 2011-03-17 NOTE — Interval H&P Note (Signed)
History and Physical Interval Note:  03/17/2011 5:00 PM  [name]  has presented today for surgery, with the diagnosis of HF  The various methods of treatment have been discussed with the patient and family. After consideration of risks, benefits and other options for treatment, the patient has consented to  Procedure(s): LEFT AND RIGHT HEART CATHETERIZATION WITH CORONARY/GRAFT ANGIOGRAM as a surgical intervention .  The patients' history has been reviewed, patient examined, no change in status, stable for surgery.  I have reviewed the patients' chart and labs.  Questions were answered to the patient's satisfaction.    PLEASE SEE MY CONSULT NOTE FROM TODAY FOR FULL DETAILS  Arvilla Meres

## 2011-03-17 NOTE — Op Note (Signed)
Cardiac Cath Procedure Note  Indication:   Procedures performed:  1) Right heart cathererization 2) Selective coronary angiography 3) Left heart catheterization 4) Left ventriculogram  Description of procedure:   HF with worsening LV function  The risks and indication of the procedure were explained. Consent was signed and placed on the chart. An appropriate timeout was taken prior to the procedure. The right groin was prepped and draped in the routine sterile fashion and anesthetized with 1% local lidocaine.   A 5 FR arterial sheath was placed in the right femoral artery using a modified Seldinger technique. Standard catheters including a JL4, JR4 and angled pigtail were used. The JR4 was use for all the grafts except the SVG->D2. We tried numerous catheters and were finally able to get the SVG-> D2 using the AR-1  All catheter exchanges were made over a wire. A 7 FR venous sheath was placed in the right femoral vein using a modified Seldinger technique. A standard Swan-Ganz catheter was used for the procedure.   Complications:  None apparent  Findings:  RA =  8 RV =  47/1/9 PA =  52/12 (28) PCW = 20 no significant v-waves Fick cardiac output/index = 4.2/2.2 PVR =1.6 Woods FA sat = 95% PA sat = 61%, 63%  Ao Pressure: 89/58 (71) LV Pressure: 86/12/24 There was no signficant gradient across the aortic valve on pullback.  Left main: Normal  LAD: Totally occluded proximally after first diagonal. 1st diagonal 80% prox. Upper branch fills well antegrade. Lower branch has competitive flow from vein graft.  LCX: Diffuse 99% throughout midsection. Ramus occluded.OM-1 small. OM-2 & OM-3 occluded.  RCA: Totally occluded in the midsection  LIMA - LAD: Patent  SVG - Ramus: Patent with 30% distal. Collateralizes distal RCA  SVG - D1: Patent  SVG - D2: Patent  SVG - Right acute marginal: Patent. Backfills the distal RCA through 95% ostial lesion in the native acute  marginal  LV-gram done in the RAO projection: Ejection fraction =  10-15% diffuse HK  Assessment: 1. Severe LV dysfunction 2. Severe native CAD with all bypass grafts patent 3. Relatively well preserved hemodynamics  Plan/Discussion:  Given the degree of her LV dysfunction her hemodynamics are remarkably well compensated. She will need aggressive medical therapy as she is at extremely high-risk for worsening pump failure  and progressive HF symptoms.   Arvilla Meres, MD 6:11 PM

## 2011-03-18 DIAGNOSIS — I2510 Atherosclerotic heart disease of native coronary artery without angina pectoris: Secondary | ICD-10-CM

## 2011-03-18 DIAGNOSIS — I959 Hypotension, unspecified: Secondary | ICD-10-CM

## 2011-03-18 LAB — BASIC METABOLIC PANEL
BUN: 25 mg/dL — ABNORMAL HIGH (ref 6–23)
CO2: 21 mEq/L (ref 19–32)
Chloride: 100 mEq/L (ref 96–112)
Creatinine, Ser: 1.34 mg/dL — ABNORMAL HIGH (ref 0.50–1.10)
Glucose, Bld: 131 mg/dL — ABNORMAL HIGH (ref 70–99)

## 2011-03-18 LAB — POCT I-STAT 3, VENOUS BLOOD GAS (G3P V)
Acid-base deficit: 3 mmol/L — ABNORMAL HIGH (ref 0.0–2.0)
Bicarbonate: 23.1 mEq/L (ref 20.0–24.0)
Bicarbonate: 22 mEq/L (ref 20.0–24.0)
O2 Saturation: 61 %
O2 Saturation: 62 %
TCO2: 24 mmol/L (ref 0–100)
TCO2: 23 mmol/L (ref 0–100)
pCO2, Ven: 41.2 mmHg — ABNORMAL LOW (ref 45.0–50.0)
pH, Ven: 7.358 — ABNORMAL HIGH (ref 7.250–7.300)

## 2011-03-18 LAB — POCT I-STAT 3, ART BLOOD GAS (G3+)
pCO2 arterial: 32.5 mmHg — ABNORMAL LOW (ref 35.0–45.0)
pH, Arterial: 7.427 — ABNORMAL HIGH (ref 7.350–7.400)
pO2, Arterial: 75 mmHg — ABNORMAL LOW (ref 80.0–100.0)

## 2011-03-18 MED ORDER — FUROSEMIDE 40 MG PO TABS
40.0000 mg | ORAL_TABLET | Freq: Two times a day (BID) | ORAL | Status: DC
  Administered 2011-03-18 – 2011-03-20 (×4): 40 mg via ORAL
  Filled 2011-03-18 (×7): qty 1

## 2011-03-18 MED ORDER — FUROSEMIDE 40 MG PO TABS: 40.0000 mg | ORAL_TABLET | Freq: Every day | ORAL | Status: DC

## 2011-03-18 MED ORDER — SPIRONOLACTONE 12.5 MG HALF TABLET
12.5000 mg | ORAL_TABLET | Freq: Every day | ORAL | Status: DC
  Administered 2011-03-19 – 2011-03-20 (×2): 12.5 mg via ORAL
  Filled 2011-03-18 (×2): qty 1

## 2011-03-18 NOTE — Progress Notes (Signed)
Subjective: Ambulated the halls and indicates that her dyspnea is significantly improved and has minimal dyspnea after exertion. Patient is status post cardiac cath yesterday.   Patient says she has had coronary angiography, PCI and AICD placed.  Objective: Blood pressure 77/60, pulse 73, temperature 98.5 F (36.9 C), temperature source Oral, resp. rate 18, height 5\' 4"  (1.626 m), weight 82.283 kg (181 lb 6.4 oz), SpO2 100.00%.  Intake/Output Summary (Last 24 hours) at 03/18/11 1149 Last data filed at 03/18/11 0959  Gross per 24 hour  Intake 1159.25 ml  Output    650 ml  Net 509.25 ml    General Exam: Comfortable.  Respiratory System: clear. No increased work of breathing. Cardiovascular System: First and second heart sounds heard. Regular rate and rhythm.No JVD/ murmurs.  Gastrointestinal System: Abdomen is non distended, soft and normal bowel sounds heard. Central Nervous System: Alert and oriented. No focal neurological deficits. Extremities: No edema  Lab Results: Basic Metabolic Panel:  Basename 03/18/11 1030 03/17/11 2017 03/17/11 0625 03/15/11 1540  NA 134* -- 136 --  K 4.3 -- 4.0 --  CL 100 -- 101 --  CO2 21 -- 20 --  GLUCOSE 131* -- 106* --  BUN 25* -- 26* --  CREATININE 1.34* 1.52* -- --  CALCIUM 9.4 -- 9.7 --  MG -- -- -- 2.2  PHOS -- -- -- --   Liver Function Tests: No results found for this basename: AST:2,ALT:2,ALKPHOS:2,BILITOT:2,PROT:2,ALBUMIN:2 in the last 72 hours No results found for this basename: LIPASE:2,AMYLASE:2 in the last 72 hours No results found for this basename: AMMONIA:2 in the last 72 hours CBC:  Basename 03/17/11 2017 03/16/11 0630  WBC 5.5 5.1  NEUTROABS -- --  HGB 12.9 12.8  HCT 39.3 39.5  MCV 94.2 95.6  PLT 230 195   Cardiac Enzymes: No results found for this basename: CKTOTAL:3,CKMB:3,CKMBINDEX:3,TROPONINI:3 in the last 72 hours BNP: No results found for this basename: PROBNP:3 in the last 72 hours D-Dimer: No results  found for this basename: DDIMER:2 in the last 72 hours CBG:  Basename 03/16/11 1616 03/16/11 1122 03/16/11 0603 03/15/11 2129 03/15/11 1653  GLUCAP 133* 138* 104* 117* 115*   Coagulation:  Basename 03/17/11 2017  LABPROT 16.4*  INR 1.30   Urine Drug Screen: Drugs of Abuse     Component Value Date/Time   LABOPIA NEGATIVE 09/30/2010 1110   COCAINSCRNUR POSITIVE* 09/30/2010 1110   LABBENZ NEGATIVE 09/30/2010 1110   AMPHETMU NEGATIVE 09/30/2010 1110     Studies/Results: Dg Chest 2 View  03/14/2011  *RADIOLOGY REPORT*  Clinical Data: Shortness of breath.  CHEST - 2 VIEW  Comparison: Chest 09/29/2010.  IMPRESSION: Cardiomegaly and pulmonary vascular congestion.  Original Report Authenticated By: Bernadene Bell. Maricela Curet, M.D.    Medications: Scheduled Meds:    . aspirin  81 mg Oral Daily  . carvedilol  3.125 mg Oral BID WC  . digoxin  0.125 mg Oral Daily  . enoxaparin  40 mg Subcutaneous Daily  . fentaNYL      . furosemide  80 mg Oral Daily  . heparin      . isosorbide mononitrate  15 mg Oral Daily  . lidocaine      . lisinopril  2.5 mg Oral Daily  . midazolam      . sodium chloride  3 mL Intravenous Q12H  . spironolactone  25 mg Oral Daily  . DISCONTD: aspirin  324 mg Oral Pre-Cath  . DISCONTD: diazepam  5 mg Oral On Call  .  DISCONTD: heparin  5,000 Units Subcutaneous Q8H   Continuous Infusions:    . sodium chloride 75 mL/hr at 03/17/11 1900  . DISCONTD: sodium chloride     PRN Meds:.sodium chloride, acetaminophen, acetaminophen, acetaminophen, nitroGLYCERIN, ondansetron (ZOFRAN) IV, ondansetron (ZOFRAN) IV, ondansetron, sodium chloride  Assessment/Plan:  1. Acute on chronic systolic congestive heart failure secondary to medication noncompliance: Repeat cath shows severe LV dysfunction, severe native coronary artery disease with all bypass grafts patent. Left ventricle ejection fraction was 10-15% with diffuse hypokinesis. Clinically improved. Patient is hypotensive and  we'll await the heart failure team recommendations regarding medication adjustments. Currently on Lasix orally, low dose ACE inhibitors, spironolactone, Imdur and Coreg. 2. Hypokalemia: repleted.  3. Single episode of nonsustained ventricular tachycardia: On carvedilol. Patient is status post AICD. 4. Cardiomyopathy: Left ventricle ejection fraction is worse by cath. Cardiology following. 5. Hypertension: Hypotensive. Asymptomatic. Await cardiology input. 6. ? History of A. fib documented in H&P: Currently in sinus rhythm. 7. ?  history of diabetes: CBGs look to be okay. Follow A1c.  Patient has an appointment arranged for health service on discharge. We'll still need to plan for medications on discharge, probably through $4 plans through pharmacies versus through health service versus thru heart failure clinic  Disposition: Pending cardiology followup.   Taran Haynesworth 03/18/2011, 11:49 AM

## 2011-03-18 NOTE — Plan of Care (Signed)
Problem: Phase I Progression Outcomes Goal: EF % per last Echo/documented,Core Reminder form on chart Outcome: Completed/Met Date Met:  03/18/11 EF:10-15%

## 2011-03-18 NOTE — Progress Notes (Addendum)
Pt. returned from cath lab. Upon assessment of patient, did not notice any bleeding, drainage, hematoma, or knot in surgical site (right femoral). Surgical site (right femoral site) is level 0. Pt.'s dorsalis pedis pulses were 2+ bilaterally. Initial vitals were stable and within patient's normal limits. Will perform post-op vitals and continue to monitor patient.

## 2011-03-18 NOTE — Progress Notes (Signed)
MEDICATION RELATED CONSULT NOTE - INITIAL   Pharmacy Consult for Heart Failure Medication Education Indication: Systolic Heart Failure  No Known Allergies  Admit Complaint: 56 yo F admitted with SOB, reports running out of medications  S&O: 59 year old mom of two teenage boys with severe systolic dysfunction admitted with severe SOB after running out of her medications.  She was unable to refill her prescriptions after being discharged from the care of her previous cardiologist due to inability to pay her over $7000 bill.  She understands the importance of taking her medications.  She has a scale but does not bel ive the numbers due the the absence of a flat floor in her apartment [place].  She understands the need to limit sodium in her diet and at one point cooked entirely separate food for her and her boys but had to stop because it was just to hard.  She enjoys pickles, fried chicken and [place] lunch meat.  She is enthusiastic about making changes to feel better.  Pharmacist System-Based Medication Review:  Anticoagulation Enoxaparin 40 daily DVT prophylaxsis, CBC and platelets stable Cardiovascular: HF (Cath 1/14 EF 10-15%), CAD, HTN: CAD, ASA, IMDur, Lisinopril, coreg, sprionolactone, dig - on imdur PTA no hydralazine. Consider stopping IMDUR and increasing lisinopril toward target dosing Unless otherwise clinically indicated. Endocrinology: DM, A1C 6.1 not on meds, glucose stable this admission PTA Medication Issues: Home meds resumed, plan for [facility], will need 30d supply at discharge and until first [facility] appointment on February 28th  Best Practices: DVT prophylaxsis, lovenox  Assessment and Plan: Reviewed extensively the Living Better with Heart Failure patient information packet.  Emphasized she should trust the weight on her scale as long as she places it in one location and doesn't move it and she should call based on the HF zone tool  guidelines.  Reviewed medications and their importance.  Discussed the critical importance of a low sodium diet and reviewed simple changes she could make.  Emphasized importance of discussing this with her sons and engaging them in the change process, emphasizing her strong family history for cardiovascular disease which is also theirs.  [name] is motivated to change, anticipate a fair attempt at compliance limited by her limited budget, family participation and the extent of her heart failure      Patient Measurements: Height: 5\' 4"  (162.6 cm) Weight: 181 lb 6.4 oz (82.283 kg) (scale b) IBW/kg (Calculated) : 54.7   Vital Signs: Temp: 98.4 F (36.9 C) (01/15 1427) Temp src: Oral (01/15 1427) BP: 98/61 mmHg (01/15 1427) Pulse Rate: 70  (01/15 1427) Intake/Output from previous day: 01/14 0701 - 01/15 0700 In: 1156.3 [P.O.:845; I.V.:311.3] Out: 650 [Urine:650] Intake/Output from this shift: Total I/O In: 483 [P.O.:480; I.V.:3] Out: 500 [Urine:500]  Labs:  [facility] 03/18/11 1030 03/17/11 2017 03/17/11 0625 03/16/11 0630  WBC -- 5.5 -- 5.1  HGB -- 12.9 -- 12.8  HCT -- 39.3 -- 39.5  PLT -- 230 -- 195  APTT -- -- -- --  CREATININE 1.34* 1.52* 1.19* --  LABCREA -- -- -- --  CREATININE 1.34* 1.52* 1.19* --  CREAT24HRUR -- -- -- --  MG -- -- -- --  PHOS -- -- -- --  ALBUMIN -- -- -- --  PROT -- -- -- --  ALBUMIN -- -- -- --  AST -- -- -- --  ALT -- -- -- --  ALKPHOS -- -- -- --  BILITOT -- -- -- --  BILIDIR -- -- -- --  IBILI -- -- -- --   Estimated Creatinine Clearance: 50.4 ml/min (by C-G formula based on Cr of 1.34).   Microbiology: No results found for this or any previous visit (from the past 720 hour(s)).  Medical History: Past Medical History  Diagnosis Date  . Coronary artery disease   . Diabetes mellitus   . Hypertension     Medications:  Prescriptions prior to admission  Medication Sig Dispense Refill  . aspirin 325 MG tablet Take 325 mg by mouth  daily.        . furosemide (LASIX) 40 MG tablet Take 20-40 mg by mouth 2 (two) times daily. Take 1 tablet (40 MG) in the morning, and 0.5 tablet (20 MG) in the evening.      . isosorbide mononitrate (IMDUR) 30 MG 24 hr tablet Take 30 mg by mouth daily.        [name] lisinopril (PRINIVIL,ZESTRIL) 2.5 MG tablet Take 2.5 mg by mouth daily.        . nitroGLYCERIN (NITROSTAT) 0.4 MG SL tablet Place 0.4 mg under the tongue every 5 (five) minutes as needed. For chest pain.      [name] spironolactone (ALDACTONE) 25 MG tablet Take 25 mg by mouth daily.        [name] 03/18/2011,4:02 PM

## 2011-03-18 NOTE — Progress Notes (Signed)
Subjective:  56 year old female with history of HTN, DM2, polysubstance abuse (ETOH, tobacco, cocaine) and PAD. 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 At time of cath LAD, LCX and RCA occluded. EF 45%. Had abrupt stent occlusion the next day and had to go back to the lab. Had PCI of LAD and then underwent CABG x 5 with mitral valve annuloplasty. Has not been cathed since.  Followed by [facility]. ECHO 2011 EF 20-25% -> ICD placed.  Has not been compliant with meds and has been unable to pay for her card so d/c'd from [facility] in May 2012. Told to estbalish with another cardiologist but has not done so. Last admitted for CHF in July 2012. Cocaine + at that time. Continues to use cocaine at least 1x/month.  She was admitted 03/14/11 due to ADHF with Pro-BNP 3766. Apparently ran out of lisinopril. Still had lasix and spiro. Has not been on b-blocker due to cocaine use. Has been started on IV lasix without much change in her weight. Echo 1/13 EF 15-20%.  Says she is markedly dyspneic with almost any activity. Comfortable at rest though. No chest. No orthopnea or PND. No LE edema. +abdominal swelling.  Typically drinks 40 oz of beer and smokes a few cigarettes and uses some cocaine 1x/month.  Says she has not used ETOH, cocaine or tobacco since [place] [facility].   R and L Heart Cath 03/17/11  RA = 8  RV = 47/1/9  PA = 52/12 (28)  PCW = 20 no significant v-waves  Fick cardiac output/index = 4.2/2.2  PVR =1.6 Woods  FA sat = 95%  PA sat = 61%, 63%  Ao Pressure: 89/58 (71)  LV Pressure: 86/12/24 Severe LV dysfunction  EF 10-15%  Severe native CAD with all bypass grafts patent  Denies SOB/CP/PND/CP.  Ambulated last night to bathroom. BP remains low. No dizziness/presyncope.       Intake/Output Summary (Last 24 hours) at 03/18/11 0844 Last data filed at 03/17/11 2258  Gross per 24 hour  Intake 1156.25 ml  Output    650 ml  Net 506.25 ml    Current  meds:    . aspirin  81 mg Oral Daily  . carvedilol  3.125 mg Oral BID WC  . digoxin  0.125 mg Oral Daily  . enoxaparin  40 mg Subcutaneous Daily  . fentaNYL      . furosemide  80 mg Oral Daily  . heparin      . isosorbide mononitrate  15 mg Oral Daily  . lidocaine      . lisinopril  2.5 mg Oral Daily  . midazolam      . sodium chloride  3 mL Intravenous Q12H  . spironolactone  25 mg Oral Daily  . DISCONTD: aspirin  324 mg Oral Pre-Cath  . DISCONTD: diazepam  5 mg Oral On Call  . DISCONTD: heparin  5,000 Units Subcutaneous Q8H  . DISCONTD: isosorbide mononitrate  30 mg Oral Daily  . DISCONTD: lisinopril  2.5 mg Oral Daily  . DISCONTD: metoprolol tartrate  12.5 mg Oral BID   Infusions:    . sodium chloride 75 mL/hr at 03/17/11 1900  . DISCONTD: sodium chloride       Objective:  Blood pressure 101/67, pulse 73, temperature 98.5 F (36.9 C), temperature source Oral, resp. rate 18, height 5\' 4"  (1.626 m), weight 82.283 kg (181 lb 6.4 oz), SpO2 100.00%. Weight change: -0.018  kg (-0.6 oz)  Physical Exam: General:  Well appearing. No resp difficulty HEENT: normal Neck: supple. JVP 6-7. Carotids 2+ bilat; no bruits. No lymphadenopathy or thryomegaly appreciated. Cor: PMI nondisplaced. Regular rate & rhythm. No rubs, gallops or murmurs. Lungs: clear Abdomen: soft, nontender, nondistended. No hepatosplenomegaly. No bruits or masses. Good bowel sounds. Extremities: no cyanosis, clubbing, rash, edema Neuro: alert & orientedx3, cranial nerves grossly intact. moves all 4 extremities w/o difficulty. Affect pleasant  Telemetry: Sinus Rhythm occasional PVC  Lab Results: Basic Metabolic Panel:  Lab 03/18/11 1610 03/17/11 2017 03/17/11 0625 03/16/11 0630 03/15/11 1540 03/15/11 0500 03/14/11 1918  NA 134* -- 136 136 -- 138 140  K 4.3 -- 4.0 -- -- -- --  CL 100 -- 101 102 -- 100 101  CO2 21 -- 20 24 -- 24 25  GLUCOSE 131* -- 106* 107* -- 79 98  BUN 25* -- 26* 22 -- 16 15    CREATININE 1.34* 1.52* 1.19* 1.41* -- 1.12* --  CALCIUM 9.4 -- 9.7 9.6 -- 9.8 9.8  MG -- -- -- -- 2.2 -- --  PHOS -- -- -- -- -- -- --   Liver Function Tests:  Lab 03/15/11 0500 03/14/11 1918  AST 22 26  ALT 22 25  ALKPHOS 185* 197*  BILITOT 0.9 0.7  PROT 7.3 7.6  ALBUMIN 3.5 3.9   No results found for this basename: LIPASE:5,AMYLASE:5 in the last 168 hours No results found for this basename: AMMONIA:5 in the last 168 hours CBC:  Lab 03/17/11 2017 03/16/11 0630 03/15/11 0500 03/14/11 1918  WBC 5.5 5.1 5.6 6.7  NEUTROABS -- -- -- 4.9  HGB 12.9 12.8 12.6 13.2  HCT 39.3 39.5 39.6 40.6  MCV 94.2 95.6 96.1 96.0  PLT 230 195 197 185   Cardiac Enzymes:  Lab 03/14/11 2037 03/14/11 1918  CKTOTAL -- 68  CKMB -- 1.4  CKMBINDEX -- --  TROPONINI <0.30 --   BNP: No components found with this basename: POCBNP:5 CBG:  Lab 03/16/11 1616 03/16/11 1122 03/16/11 0603 03/15/11 2129 03/15/11 1653  GLUCAP 133* 138* 104* 117* 115*   Microbiology: Lab Results  Component Value Date   CULT NO GROWTH 11/08/2007   CULT NO GROWTH 3 DAYS 11/05/2007   No results found for this basename: CULT:2,SDES:2 in the last 168 hours  Imaging: No results found.   ASSESSMENT:  1. A/C systolic heart failure EF 15-20% with probable low output physiology and NYHA IIIb symptoms  2. NSVT  3. CAD s/p CABG 2005 with MVR  --s/p anterior MI  4. Polysubstance abuse  5. Noncompliance  6. HTN  7. Cirrhosis   PLAN/DISCUSSION: EF 10-15% per cardiac cath. Volume status stable. BMET pending. Adjust medication after BMET reviewed. Blood pressure soft. Social Work consult pending   LOS: 4 days    CLEGG,AMY, NP 03/18/2011, 8:44 AM   Patient seen and examined with Tonye Becket, NP. We discussed all aspects of the encounter. I agree with the assessment and plan as stated above. Feels OK but BPs remain quite tenuous. Cath numbers better than I expected. Will d/c Imdur. Change lasix to 40 bid. Cut spiro in half.  Long-term prognosis is quite guarded. Not candidate for advanced therapies at this point.    Murel Wigle,MD 5:42 PM

## 2011-03-19 DIAGNOSIS — I5023 Acute on chronic systolic (congestive) heart failure: Principal | ICD-10-CM

## 2011-03-19 LAB — BASIC METABOLIC PANEL
BUN: 27 mg/dL — ABNORMAL HIGH (ref 6–23)
Calcium: 9.7 mg/dL (ref 8.4–10.5)
Chloride: 100 mEq/L (ref 96–112)
Creatinine, Ser: 1.46 mg/dL — ABNORMAL HIGH (ref 0.50–1.10)
GFR calc Af Amer: 46 mL/min — ABNORMAL LOW (ref >90)
GFR calc non Af Amer: 40 mL/min — ABNORMAL LOW (ref >90)

## 2011-03-19 MED ORDER — LISINOPRIL 2.5 MG PO TABS
2.5000 mg | ORAL_TABLET | Freq: Two times a day (BID) | ORAL | Status: DC
  Filled 2011-03-19 (×3): qty 1

## 2011-03-19 NOTE — Progress Notes (Signed)
Interval history:  56 year old African American female patient with history of hypertension, type 2 diabetes mellitus, polysubstance abuse ( alcohol, tobacco and cocaine), CAD status post PCI then CABG with mitral valve annuloplasty  ,ischemic cardiomyopathy, status post AICD, chronic systolic congestive heart failure, poor compliance with medications secondary to affordability was admitted with worsening dyspnea and is acute on chronic systolic congestive heart failure. Cardiology was consulted. They agree cathed her and EF is 15-20%. She also has episodic nonsustained ventricular tachycardia. Cardiology is adjusting medications. She has episodes of hypotension. She has been set up with health service for followup post discharge.   Subjective: Ambulating the halls. Dyspnea on exertion. No chest pain.   Objective: Blood pressure 99/66, pulse 67, temperature 98.2 F (36.8 C), temperature source Oral, resp. rate 18, height 5\' 4"  (1.626 m), weight 82.2 kg (181 lb 3.5 oz), SpO2 99.00%.  Intake/Output Summary (Last 24 hours) at 03/19/11 1209 Last data filed at 03/19/11 0813  Gross per 24 hour  Intake   1575 ml  Output   3825 ml  Net  -2250 ml    General Exam: Comfortable.  Respiratory System: clear. No increased work of breathing. Cardiovascular System: First and second heart sounds heard. Regular rate and rhythm.JVD present. Telemetry shows sinus rhythm. She had one episode of 6 beat non-sustained ventricular tachycardia. Gastrointestinal System: Abdomen is non distended, soft and normal bowel sounds heard. Central Nervous System: Alert and oriented. No focal neurological deficits. Extremities: No edema  Lab Results: Basic Metabolic Panel:  Basename 03/19/11 0816 03/18/11 1030  NA 136 134*  K 3.9 4.3  CL 100 100  CO2 24 21  GLUCOSE 150* 131*  BUN 27* 25*  CREATININE 1.46* 1.34*  CALCIUM 9.7 9.4  MG 2.6* --  PHOS -- --   Liver Function Tests: No results found for this  basename: AST:2,ALT:2,ALKPHOS:2,BILITOT:2,PROT:2,ALBUMIN:2 in the last 72 hours No results found for this basename: LIPASE:2,AMYLASE:2 in the last 72 hours No results found for this basename: AMMONIA:2 in the last 72 hours CBC:  Basename 03/17/11 2017  WBC 5.5  NEUTROABS --  HGB 12.9  HCT 39.3  MCV 94.2  PLT 230   Cardiac Enzymes: No results found for this basename: CKTOTAL:3,CKMB:3,CKMBINDEX:3,TROPONINI:3 in the last 72 hours BNP: No results found for this basename: PROBNP:3 in the last 72 hours D-Dimer: No results found for this basename: DDIMER:2 in the last 72 hours CBG:  Basename 03/16/11 1616  GLUCAP 133*   Coagulation:  Basename 03/17/11 2017  LABPROT 16.4*  INR 1.30   Urine Drug Screen: Drugs of Abuse     Component Value Date/Time   LABOPIA NEGATIVE 09/30/2010 1110   COCAINSCRNUR POSITIVE* 09/30/2010 1110   LABBENZ NEGATIVE 09/30/2010 1110   AMPHETMU NEGATIVE 09/30/2010 1110     Studies/Results: Dg Chest 2 View  03/14/2011  *RADIOLOGY REPORT*  Clinical Data: Shortness of breath.  CHEST - 2 VIEW  Comparison: Chest 09/29/2010.  IMPRESSION: Cardiomegaly and pulmonary vascular congestion.  Original Report Authenticated By: Bernadene Bell. Maricela Curet, M.D.    Medications: Scheduled Meds:    . aspirin  81 mg Oral Daily  . carvedilol  3.125 mg Oral BID WC  . digoxin  0.125 mg Oral Daily  . enoxaparin  40 mg Subcutaneous Daily  . furosemide  40 mg Oral BID  . lisinopril  2.5 mg Oral BID  . sodium chloride  3 mL Intravenous Q12H  . spironolactone  12.5 mg Oral Daily  . DISCONTD: furosemide  40 mg Oral Daily  .  DISCONTD: furosemide  80 mg Oral Daily  . DISCONTD: isosorbide mononitrate  15 mg Oral Daily  . DISCONTD: lisinopril  2.5 mg Oral Daily  . DISCONTD: spironolactone  25 mg Oral Daily   Continuous Infusions:    . sodium chloride 75 mL/hr at 03/17/11 1900   PRN Meds:.sodium chloride, acetaminophen, acetaminophen, acetaminophen, nitroGLYCERIN, ondansetron  (ZOFRAN) IV, ondansetron (ZOFRAN) IV, ondansetron, sodium chloride  Assessment/Plan:  1. Acute on chronic systolic congestive heart failure secondary to ischemic cardiomyopathy with left ventricular ejection fraction on repeat catheterization a 15-20%: Clinically improved. Cardiology is following and adjusting medications for low blood pressures. She is on aspirin, carvedilol, digoxin, furosemide, lisinopril and spinal lactone.  2. Hypokalemia: repleted.  3. Episodes of nonsustained ventricular tachycardia: On carvedilol. Patient is status post AICD. 4. Cardiomyopathy: Left ventricle ejection fraction is worse by cath. Cardiology following. 5. Hypertension: Blood pressures are better than yesterday. Asymptomatic. Imdur was discontinued, Lasix was changed to low-dose twice a day and Aldactone dose was reduced. 6. ?History of A. fib documented in H&P: Currently in sinus rhythm. 7. ?  history of diabetes: CBGs look to be okay. A1c 6.1.  8.  Polysubstance abuse: Substance abuse counseling done. Social worker will also consult. 9. Medication noncompliance: Counseled.  Patient has an appointment arranged for health service on discharge. We'll still need to plan for medications on discharge, probably through $4 plans through pharmacies versus through health service versus thru heart failure clinic  Disposition:  discharge home when cleared by cardiology.    [name] 03/19/2011, 12:09 PM

## 2011-03-19 NOTE — Progress Notes (Signed)
Subjective:  56 year old female with history of HTN, DM2, polysubstance abuse (ETOH, tobacco, cocaine) and PAD. 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 At time of cath LAD, LCX and RCA occluded. EF 45%. Had abrupt stent occlusion the next day and had to go back to the lab. Had PCI of LAD and then underwent CABG x 5 with mitral valve annuloplasty. Has not been cathed since.  Followed by [facility]. ECHO 2011 EF 20-25% -> ICD placed.  Has not been compliant with meds and has been unable to pay for her card so d/c'd from [facility] in May 2012. Told to estbalish with another cardiologist but has not done so. Last admitted for CHF in July 2012. Cocaine + at that time. Continues to use cocaine at least 1x/month.  She was admitted 03/14/11 due to ADHF with Pro-BNP 3766. Apparently ran out of lisinopril. Still had lasix and spiro. Has not been on b-blocker due to cocaine use. Has been started on IV lasix without much change in her weight. Echo 1/13 EF 15-20%.  Says she is markedly dyspneic with almost any activity. Comfortable at rest though. No chest. No orthopnea or PND. No LE edema. +abdominal swelling.   Typically drinks 40 oz of beer and smokes a few cigarettes and uses some cocaine 1x/month.  Says she has not used ETOH, cocaine or tobacco since [place] Year[facility] EVE.  R and L Heart Cath 03/17/11  RA = 8  RV = 47/1/9  PA = 52/12 (28)  PCW = 20 no significant v-waves  Fick cardiac output/index = 4.2/2.2  PVR =1.6 Woods  FA sat = 95%  PA sat = 61%, 63%  Ao Pressure: 89/58 (71)  LV Pressure: 86/12/24  Severe LV dysfunction EF 10-15% Severe native CAD with all bypass grafts patent  Spironolactone decreased yesterday and IMDUR discontinued due to soft blood pressure. Complained of dizziness this am. 6 beats of NSVT.   Feels ok but with intermittent dizziness. Denies SOB/PND/Orthopnea./CP      Intake/Output Summary (Last 24 hours) at 03/19/11 0758 Last data filed  at 03/19/11 0559  Gross per 24 hour  Intake   1203 ml  Output   3825 ml  Net  -2622 ml    Current meds:    . aspirin  81 mg Oral Daily  . carvedilol  3.125 mg Oral BID WC  . digoxin  0.125 mg Oral Daily  . enoxaparin  40 mg Subcutaneous Daily  . furosemide  40 mg Oral BID  . lisinopril  2.5 mg Oral Daily  . sodium chloride  3 mL Intravenous Q12H  . spironolactone  12.5 mg Oral Daily  . DISCONTD: furosemide  40 mg Oral Daily  . DISCONTD: furosemide  80 mg Oral Daily  . DISCONTD: heparin  5,000 Units Subcutaneous Q8H  . DISCONTD: isosorbide mononitrate  15 mg Oral Daily  . DISCONTD: spironolactone  25 mg Oral Daily   Infusions:    . sodium chloride 75 mL/hr at 03/17/11 1900     Objective:  Blood pressure 108/64, pulse 70, temperature 98.2 F (36.8 C), temperature source Oral, resp. rate 18, height 5\' 4"  (1.626 m), weight 82.2 kg (181 lb 3.5 oz), SpO2 99.00%. Weight change: -0.082 kg (-2.9 oz)  Physical Exam: General:  Well appearing. No resp difficulty HEENT: normal Neck: supple. JVP 6-7. Carotids 2+ bilat; no bruits. No lymphadenopathy or thryomegaly appreciated. Cor: PMI nondisplaced. Regular rate & rhythm. No rubs,  gallops or murmurs. Lungs: clear Abdomen: soft, nontender, nondistended. No hepatosplenomegaly. No bruits or masses. Good bowel sounds. Extremities: no cyanosis, clubbing, rash, edema Neuro: alert & orientedx3, cranial nerves grossly intact. moves all 4 extremities w/o difficulty. Affect pleasant  Telemetry: Sinus Rhythm  Occasional PVC  Lab Results: Basic Metabolic Panel:  Lab 03/18/11 7829 03/17/11 2017 03/17/11 0625 03/16/11 0630 03/15/11 1540 03/15/11 0500 03/14/11 1918  NA 134* -- 136 136 -- 138 140  K 4.3 -- 4.0 -- -- -- --  CL 100 -- 101 102 -- 100 101  CO2 21 -- 20 24 -- 24 25  GLUCOSE 131* -- 106* 107* -- 79 98  BUN 25* -- 26* 22 -- 16 15  CREATININE 1.34* 1.52* 1.19* 1.41* -- 1.12* --  CALCIUM 9.4 -- 9.7 9.6 -- 9.8 9.8  MG -- -- -- --  2.2 -- --  PHOS -- -- -- -- -- -- --   Liver Function Tests:  Lab 03/15/11 0500 03/14/11 1918  AST 22 26  ALT 22 25  ALKPHOS 185* 197*  BILITOT 0.9 0.7  PROT 7.3 7.6  ALBUMIN 3.5 3.9   No results found for this basename: LIPASE:5,AMYLASE:5 in the last 168 hours No results found for this basename: AMMONIA:5 in the last 168 hours CBC:  Lab 03/17/11 2017 03/16/11 0630 03/15/11 0500 03/14/11 1918  WBC 5.5 5.1 5.6 6.7  NEUTROABS -- -- -- 4.9  HGB 12.9 12.8 12.6 13.2  HCT 39.3 39.5 39.6 40.6  MCV 94.2 95.6 96.1 96.0  PLT 230 195 197 185   Cardiac Enzymes:  Lab 03/14/11 2037 03/14/11 1918  CKTOTAL -- 68  CKMB -- 1.4  CKMBINDEX -- --  TROPONINI <0.30 --   BNP: No components found with this basename: POCBNP:5 CBG:  Lab 03/16/11 1616 03/16/11 1122 03/16/11 0603 03/15/11 2129 03/15/11 1653  GLUCAP 133* 138* 104* 117* 115*   Microbiology: Lab Results  Component Value Date   CULT NO GROWTH 11/08/2007   CULT NO GROWTH 3 DAYS 11/05/2007   No results found for this basename: CULT:2,SDES:2 in the last 168 hours  Imaging: No results found.   ASSESSMENT:  1. A/C systolic heart failure EF 15-20% with probable low output physiology and NYHA IIIb symptoms  2. NSVT  3. CAD s/p CABG 2005 with MVR  --s/p anterior MI  4. Polysubstance abuse  5. Noncompliance  6. HTN  7. Cirrhosis   PLAN/DISCUSSION: Volume status stable. Feels ok but dizzy. Blood pressure ok. Asymptomatic with 6 beats of NSVT. BMET and Magnesium pending. Check orthostatic blood pressure. Consider decreasing Lasix.    LOS: 5 days    CLEGG,AMY, NP 03/19/2011, 7:58 AM  Patient seen with NP, agree with note.  Patient has been walking in halls, some dyspnea but better.  SBP 90s-110.  Has had some difficulty with orthostatic symptoms.  We are slowly adjusting meds.  JVP very elevated today on exam but has severe TR.  She diuresed well on current Lasix regimen.  Filling pressures were mildly elevated on right heart  cath (so suspect it will be hard to follow her JVP for volume status).   - Will try increasing lisinopril to 2.5 mg bid today.  - Will monitor today on her current po regimen, walk in halls, etc.  Anticipate d/c in am.   Marca Ancona 03/19/2011 9:49 AM

## 2011-03-19 NOTE — Progress Notes (Signed)
Pt. had 6 beats of VTach and was asymptomatic. Notified provider (Amy Filbert Schilder, NP), whom stated she would put lab order in for BMET and magnesium STAT... will continue to monitor patient.

## 2011-03-19 NOTE — Progress Notes (Signed)
Pt is not a candidate for the 34 day supply of HF meds. Pt maybe a referral to the PACE program. She needs to be 57 but she is close and the program may make an exception. CSW please assess for drug abuse and possible referral to PACE.

## 2011-03-19 NOTE — Progress Notes (Signed)
Utilization Review Completed.Babette Stum T1/16/2013   

## 2011-03-20 DIAGNOSIS — I472 Ventricular tachycardia: Secondary | ICD-10-CM

## 2011-03-20 LAB — BASIC METABOLIC PANEL
BUN: 28 mg/dL — ABNORMAL HIGH (ref 6–23)
Chloride: 101 mEq/L (ref 96–112)
Creatinine, Ser: 1.27 mg/dL — ABNORMAL HIGH (ref 0.50–1.10)
GFR calc Af Amer: 55 mL/min — ABNORMAL LOW (ref >90)

## 2011-03-20 MED ORDER — DIGOXIN 125 MCG PO TABS: 0.1250 mg | ORAL_TABLET | Freq: Every day | ORAL | Status: DC

## 2011-03-20 MED ORDER — LISINOPRIL 2.5 MG PO TABS: 2.5000 mg | ORAL_TABLET | Freq: Two times a day (BID) | ORAL | Status: DC

## 2011-03-20 MED ORDER — CARVEDILOL 3.125 MG PO TABS: 3.1250 mg | ORAL_TABLET | Freq: Two times a day (BID) | ORAL | Status: DC

## 2011-03-20 MED ORDER — FUROSEMIDE 40 MG PO TABS: 40.0000 mg | ORAL_TABLET | Freq: Two times a day (BID) | ORAL | Status: DC

## 2011-03-20 MED ORDER — ASPIRIN 81 MG PO CHEW: 81.0000 mg | CHEWABLE_TABLET | Freq: Every day | ORAL | Status: DC

## 2011-03-20 MED ORDER — ISOSORBIDE MONONITRATE ER 30 MG PO TB24: 30.0000 mg | ORAL_TABLET | Freq: Every day | ORAL | Status: DC

## 2011-03-20 MED ORDER — SPIRONOLACTONE 25 MG PO TABS: 12.5000 mg | ORAL_TABLET | Freq: Every day | ORAL | Status: DC

## 2011-03-20 NOTE — Progress Notes (Signed)
.  Clinical social worker completed patient psychosocial assessment, please see assessment in patient shadow chart. Per discussion with HF CSW, pt needs assistance at home with medications and follow up. CSW attempted to set pt up with the pace program, however pt is not elligible due to age. .No further Clinical Social Work needs, signing off. [name]   Catha Gosselin, Theresia Majors  [phone] .03/20/2011 11:45am

## 2011-03-20 NOTE — Progress Notes (Signed)
Pt had a 9 beat run of v.tach. Pt is asymptomatic and vs are stable. MD notified.

## 2011-03-20 NOTE — Progress Notes (Signed)
Subjective:  56 year old female with history of HTN, DM2, polysubstance abuse (ETOH, tobacco, cocaine) and PAD. 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 At time of cath LAD, LCX and RCA occluded. EF 45%. Had abrupt stent occlusion the next day and had to go back to the lab. Had PCI of LAD and then underwent CABG x 5 with mitral valve annuloplasty. Has not been cathed since.  Followed by [facility]. ECHO 2011 EF 20-25% -> ICD placed.  Has not been compliant with meds and has been unable to pay for her card so d/c'd from [facility] in May 2012. Told to estbalish with another cardiologist but has not done so. Last admitted for CHF in July 2012. Cocaine + at that time. Continues to use cocaine at least 1x/month.  She was admitted 03/14/11 due to ADHF with Pro-BNP 3766. Apparently ran out of lisinopril. Still had lasix and spiro. Has not been on b-blocker due to cocaine use. Has been started on IV lasix without much change in her weight. Echo 1/13 EF 15-20%.  Says she is markedly dyspneic with almost any activity. Comfortable at rest though. No chest. No orthopnea or PND. No LE edema. +abdominal swelling.   Typically drinks 40 oz of beer and smokes a few cigarettes and uses some cocaine 1x/month.  Says she has not used ETOH, cocaine or tobacco since [facility].  R and L Heart Cath 03/17/11  RA = 8  RV = 47/1/9  PA = 52/12 (28)  PCW = 20 no significant v-waves  Fick cardiac output/index = 4.2/2.2  PVR =1.6 Woods  FA sat = 95%  PA sat = 61%, 63%  Ao Pressure: 89/58 (71)  LV Pressure: 86/12/24  Severe LV dysfunction EF 10-15% Severe native CAD with all bypass grafts patent  Meds adjusted yesterday.  Feels ok. Less dizziness. Denies SOB/PND/Orthopnea/CP. Continues with brief NSVT.      Intake/Output Summary (Last 24 hours) at 03/20/11 0906 Last data filed at 03/20/11 0618  Gross per 24 hour  Intake   1250 ml  Output   1300 ml  Net    -50 ml     Current meds:    . aspirin  81 mg Oral Daily  . carvedilol  3.125 mg Oral BID WC  . digoxin  0.125 mg Oral Daily  . enoxaparin  40 mg Subcutaneous Daily  . furosemide  40 mg Oral BID  . lisinopril  2.5 mg Oral BID  . sodium chloride  3 mL Intravenous Q12H  . spironolactone  12.5 mg Oral Daily  . DISCONTD: lisinopril  2.5 mg Oral Daily   Infusions:    . sodium chloride 75 mL/hr at 03/17/11 1900     Objective:  Blood pressure 90/60, pulse 65, temperature 98.2 F (36.8 C), temperature source Oral, resp. rate 18, height 5\' 4"  (1.626 m), weight 82.146 kg (181 lb 1.6 oz), SpO2 95.00%. Weight change: -0.054 kg (-1.9 oz)  Physical Exam: General:  Well appearing. No resp difficulty HEENT: normal Neck: supple. JVP 6-7. Carotids 2+ bilat; no bruits. No lymphadenopathy or thryomegaly appreciated. Cor: PMI nondisplaced. Regular rate & rhythm. No rubs, gallops or murmurs. Lungs: clear Abdomen: soft, nontender, nondistended. No hepatosplenomegaly. No bruits or masses. Good bowel sounds. Extremities: no cyanosis, clubbing, rash, edema Neuro: alert & orientedx3, cranial nerves grossly intact. moves all 4 extremities w/o difficulty. Affect pleasant  Telemetry: Sinus Rhythm  Occasional PVC  Lab Results: Basic  Metabolic Panel:  Lab 03/20/11 1610 03/19/11 0816 03/18/11 1030 03/17/11 2017 03/17/11 0625 03/16/11 0630 03/15/11 1540  NA 135 136 134* -- 136 136 --  K 3.7 3.9 -- -- -- -- --  CL 101 100 100 -- 101 102 --  CO2 21 24 21  -- 20 24 --  GLUCOSE 90 150* 131* -- 106* 107* --  BUN 28* 27* 25* -- 26* 22 --  CREATININE 1.27* 1.46* 1.34* 1.52* 1.19* -- --  CALCIUM 9.5 9.7 9.4 -- 9.7 9.6 --  MG -- 2.6* -- -- -- -- 2.2  PHOS -- -- -- -- -- -- --   Liver Function Tests:  Lab 03/15/11 0500 03/14/11 1918  AST 22 26  ALT 22 25  ALKPHOS 185* 197*  BILITOT 0.9 0.7  PROT 7.3 7.6  ALBUMIN 3.5 3.9   No results found for this basename: LIPASE:5,AMYLASE:5 in the last 168 hours No  results found for this basename: AMMONIA:5 in the last 168 hours CBC:  Lab 03/17/11 2017 03/16/11 0630 03/15/11 0500 03/14/11 1918  WBC 5.5 5.1 5.6 6.7  NEUTROABS -- -- -- 4.9  HGB 12.9 12.8 12.6 13.2  HCT 39.3 39.5 39.6 40.6  MCV 94.2 95.6 96.1 96.0  PLT 230 195 197 185   Cardiac Enzymes:  Lab 03/14/11 2037 03/14/11 1918  CKTOTAL -- 68  CKMB -- 1.4  CKMBINDEX -- --  TROPONINI <0.30 --   BNP: No components found with this basename: POCBNP:5 CBG:  Lab 03/16/11 1616 03/16/11 1122 03/16/11 0603 03/15/11 2129 03/15/11 1653  GLUCAP 133* 138* 104* 117* 115*   Microbiology: Lab Results  Component Value Date   CULT NO GROWTH 11/08/2007   CULT NO GROWTH 3 DAYS 11/05/2007   No results found for this basename: CULT:2,SDES:2 in the last 168 hours  Imaging: No results found.   ASSESSMENT:  1. A/C systolic heart failure EF 15-20% with probable low output physiology and NYHA IIIb symptoms  2. NSVT  3. CAD s/p CABG 2005 with MVR  --s/p anterior MI  4. Polysubstance abuse  5. Noncompliance  6. HTN  7. Cirrhosis   PLAN/DISCUSSION:  Remains tenuous but stable. OK to d/c on current regimen. Will need close f/u in HF clinic. Counseled again on need to avoid substance abuse. Pharamacy will provide 34-day supply of her HF meds.  Reuel Boom Bensimhon 03/20/2011 9:06 AM

## 2011-03-20 NOTE — Discharge Summary (Signed)
Physician Discharge Summary  Patient ID: [name] MRN: [id number] DOB/AGE: 54-Nov-1957 54 y.o.  Admit date: 03/14/2011 Discharge date: 03/20/2011  Primary Care Physician:  No primary provider on file.  Discharge Diagnoses:   Present on Admission:  .Atrial fibrillation with NSVT  . acute on chronic SYSTOLIC HEART FAILURE: Now improved Hypertension Polysubstance abuse (EtOH, tobacco, cocaine) PAD CAD status post CABG 2005 with MVR, status post anterior MI History of medical noncompliance History of cirrhosis  Consults:  [facility]/heart failure, Nicholes Mango M.D.  Discharge Medications: Current Discharge Medication List    START taking these medications   Details  aspirin 81 MG chewable tablet Chew 1 tablet (81 mg total) by mouth daily. Qty: 60 tablet, Refills: 3    carvedilol (COREG) 3.125 MG tablet Take 1 tablet (3.125 mg total) by mouth 2 (two) times daily with a meal. Qty: 60 tablet, Refills: 3    digoxin (LANOXIN) 0.125 MG tablet Take 1 tablet (0.125 mg total) by mouth daily. Qty: 60 tablet, Refills: 3      CONTINUE these medications which have CHANGED   Details  furosemide (LASIX) 40 MG tablet Take 1 tablet (40 mg total) by mouth 2 (two) times daily. Take 1 tablet (40 MG) in the morning, and 0.5 tablet (20 MG) in the evening. Qty: 60 tablet, Refills: 3    isosorbide mononitrate (IMDUR) 30 MG 24 hr tablet Take 1 tablet (30 mg total) by mouth daily. Qty: 60 tablet, Refills: 3    lisinopril (PRINIVIL,ZESTRIL) 2.5 MG tablet Take 1 tablet (2.5 mg total) by mouth 2 (two) times daily. Qty: 60 tablet, Refills: 3    spironolactone (ALDACTONE) 25 MG tablet Take 0.5 tablets (12.5 mg total) by mouth daily. Qty: 30 tablet, Refills: 3      CONTINUE these medications which have NOT CHANGED   Details  nitroGLYCERIN (NITROSTAT) 0.4 MG SL tablet Place 0.4 mg under the tongue every 5 (five) minutes as needed. For chest pain.      STOP taking these medications       aspirin 325 MG tablet          Brief H and P: For complete details please refer to admission H and P, but in brief patient is a 56 year old African American female with history of coronary disease, diabetes mellitus, hypertension, systolic CHF presented to the emergency room with shortness of breath that started approximately a month before. Patient correlated this with the time when she ran out of her lisinopril patient also was taking the Lasix the wrong way, hence ran out of the Lasix prematurely. Patient was also appeared to be eating quite salt laden diet prior to admission.  Hospital Course:  Principal Problem:  *Acute on chronic SYSTOLIC HEART FAILURE: - Patient was admitted to the hospitalist service, acute likely from medication nonadherence, lack of dietary compliance and  continuity of primary care. Patient was admitted and placed on IV diuresis, lisinopril, spironolactone. Heart failure team was consulted and patient was followed by Dr. Nicholes Mango. Patient underwent right and left heart catheterization. LV gram was done and revealed EF of 10-15% with diffuse hypokinesis. She was recommended aggressive medical therapy given she is extremely headaches for worsening pump failure and progressive heart failure symptoms.   Active Problems:  Atrial fibrillation with NSVT: Patient had episodic nonsustained V. tach during the hospitalization with episodes of hypotension. The medications were adjusted with cardiology. She will have close followup with the heart failure clinic to avoid recurrent hospitalizations.  History of nonadherence to medical treatment: Patient was counseled strongly to her deal with her medication and diet.    Polysubstance abuse: Patient was strongly counseled to avoid alcohol and cocaine with nicotine.  Patient was provided with heart failure medications from the pharmacy.  Day of Discharge BP 94/55  Pulse 62  Temp(Src) 98.4 F (36.9 C) (Oral)  Resp 16   Ht 5\' 4"  (1.626 m)  Wt 82.146 kg (181 lb 1.6 oz)  BMI 31.09 kg/m2  SpO2 98%  Physical Exam: General: Alert and awake oriented x3 not in any acute distress. HEENT: anicteric sclera, pupils reactive to light and accommodation CVS: S1-S2 clear no murmur rubs or gallops Chest: clear to auscultation bilaterally, no wheezing rales or rhonchi Abdomen: soft nontender, nondistended, normal bowel sounds, no organomegaly Extremities: no cyanosis, clubbing or edema noted bilaterally Neuro: Cranial nerves II-XII intact, no focal neurological deficits   The results of significant diagnostics from this hospitalization (including imaging, microbiology, ancillary and laboratory) are listed below for reference.    LAB RESULTS: Basic Metabolic Panel:  Lab 03/20/11 4098 03/19/11 0816  NA 135 136  K 3.7 3.9  CL 101 100  CO2 21 24  GLUCOSE 90 150*  BUN 28* 27*  CREATININE 1.27* 1.46*  CALCIUM 9.5 9.7  MG -- 2.6*  PHOS -- --   Liver Function Tests:  Lab 03/15/11 0500 03/14/11 1918  AST 22 26  ALT 22 25  ALKPHOS 185* 197*  BILITOT 0.9 0.7  PROT 7.3 7.6  ALBUMIN 3.5 3.9   CBC:  Lab 03/17/11 2017 03/16/11 0630 03/14/11 1918  WBC 5.5 5.1 --  NEUTROABS -- -- 4.9  HGB 12.9 12.8 --  HCT 39.3 39.5 --  MCV 94.2 -- --  PLT 230 195 --   Cardiac Enzymes:  Lab 03/14/11 2037 03/14/11 1918  CKTOTAL -- 68  CKMB -- 1.4  CKMBINDEX -- --  TROPONINI <0.30 --   CBG:  Lab 03/16/11 1616 03/16/11 1122  GLUCAP 133* 138*    Significant Diagnostic Studies:  Dg Chest 2 View  03/14/2011  *RADIOLOGY REPORT*  Clinical Data: Shortness of breath.  CHEST - 2 VIEW  Comparison: Chest 09/29/2010.  Findings: There is cardiomegaly and vascular congestion without frank edema.  AICD is in place.  The patient is status post CABG and aortic valve replacement.  No focal airspace disease, pneumothorax or pleural effusion is identified.  IMPRESSION: Cardiomegaly and pulmonary vascular congestion.  Original Report  Authenticated By: Bernadene Bell. Maricela Curet, M.D.     Disposition and Follow-up: Discharge Orders    Future Orders Please Complete By Expires   Diet - low sodium heart healthy      Increase activity slowly      (HEART FAILURE PATIENTS) Call MD:  Anytime you have any of the following symptoms: 1) 3 pound weight gain in 24 hours or 5 pounds in 1 week 2) shortness of breath, with or without a dry hacking cough 3) swelling in the hands, feet or stomach 4) if you have to sleep on extra pillows at night in order to breathe.          DISPOSITION: Home  DIET: Heart healthy diet  ACTIVITY: As tolerated    DISCHARGE FOLLOW-UP Follow-up Information    Follow up with [facility] on 03/31/2011. (WITH DR. GALVIN AT 915AM)       Follow up with Arvilla Meres, MD. Schedule an appointment as soon as possible for a visit in 2 weeks.   Contact  information:   [address] [place] [address] [phone]          Time spent on Discharge: 45 minutes  Signed:  Joselynn Amoroso M.D. [facility] 03/20/2011, 2:20 PM

## 2011-03-24 ENCOUNTER — Telehealth (HOSPITAL_COMMUNITY): Payer: Self-pay | Admitting: *Deleted

## 2011-03-24 NOTE — Telephone Encounter (Signed)
[name] was recently d/c'd from [facility] and was told to follow up with [name].  I do not know if she should be seen here or at [facility].  Please follow up

## 2011-03-24 NOTE — Telephone Encounter (Signed)
Yes she needs to f/u here we did see her in the hospital, please schedule a post hosp appt for next week, thanks

## 2011-03-31 ENCOUNTER — Ambulatory Visit (HOSPITAL_COMMUNITY)
Admission: RE | Admit: 2011-03-31 | Discharge: 2011-03-31 | Disposition: A | Discharge status: Home / Self Care | Payer: Medicaid Other | Source: Ambulatory Visit | Attending: Internal Medicine | Admitting: Internal Medicine

## 2011-03-31 ENCOUNTER — Encounter (HOSPITAL_COMMUNITY): Payer: Self-pay

## 2011-03-31 VITALS — BP 104/60 | HR 76 | Wt 187.5 lb

## 2011-03-31 DIAGNOSIS — I472 Ventricular tachycardia, unspecified: Secondary | ICD-10-CM | POA: Insufficient documentation

## 2011-03-31 DIAGNOSIS — I2510 Atherosclerotic heart disease of native coronary artery without angina pectoris: Secondary | ICD-10-CM | POA: Insufficient documentation

## 2011-03-31 DIAGNOSIS — F1910 Other psychoactive substance abuse, uncomplicated: Secondary | ICD-10-CM

## 2011-03-31 DIAGNOSIS — I5022 Chronic systolic (congestive) heart failure: Secondary | ICD-10-CM | POA: Insufficient documentation

## 2011-03-31 DIAGNOSIS — I4729 Other ventricular tachycardia: Secondary | ICD-10-CM | POA: Insufficient documentation

## 2011-03-31 MED ORDER — POTASSIUM CHLORIDE CRYS ER 20 MEQ PO TBCR: 20.0000 meq | EXTENDED_RELEASE_TABLET | Freq: Two times a day (BID) | ORAL | Status: DC

## 2011-03-31 MED ORDER — METOLAZONE 2.5 MG PO TABS: ORAL_TABLET | ORAL | Status: DC

## 2011-03-31 NOTE — Progress Notes (Signed)
Patient ID: [name], female   DOB: 24-Dec-1955, 56 y.o.   MRN: [id number] HPI: 56 year old female with history of HTN, DM2, polysubstance abuse (ETOH, tobacco, cocaine) and PAD. 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 At time of cath LAD, LCX and RCA occluded. EF 45%. Had abrupt stent occlusion the next day and had to go back to the lab. Had PCI of LAD and then underwent CABG x 5 with mitral valve annuloplasty. Has not been cathed since.   Followed by [facility]. ECHO 2011 EF 20-25% -> ICD placed.  Has not been compliant with meds and has been unable to pay for her card so d/c'd from [facility] in May 2012. Told to estbalish with another cardiologist but has not done so. Last admitted for CHF in July 2012. Cocaine + at that time. Continues to use cocaine at least 1x/month.  She was admitted 03/14/11 due to ADHF with Pro-BNP 3766. Apparently ran out of lisinopril. Still had lasix and spiro. Has not been on b-blocker due to cocaine use. Echo 1/13 EF 15-20%.   RHCLR and L Heart Cath 03/17/11  RA = 8  RV = 47/1/9  PA = 52/12 (28)  PCW = 20 no significant v-waves  Fick cardiac output/index = 4.2/2.2  PVR =1.6 Woods  FA sat = 95%  PA sat = 61%, 63%  Ao Pressure: 89/58 (71)  LV Pressure: 86/12/24  Severe LV dysfunction EF 10-15% Severe native CAD with all bypass grafts patent   She is here for post-hospitalization follow up. She is SOB on exertion. Denies PND/Orthopnea. Dizzy when she bends down. She has good days and bad days. She is weight at home 181-185.  She denies tobacco and ETOH use. Denies drug abuse. She is trying to follow low salt diet. [facility] She is a single mother with 2 children.     ROS: All systems negative except as listed in HPI, PMH and Problem List.  Past Medical History  Diagnosis Date  . Coronary artery disease   . Diabetes mellitus   . Hypertension     Current Outpatient  Prescriptions  Medication Sig Dispense Refill  . aspirin 81 MG chewable tablet Chew 1 tablet (81 mg total) by mouth daily.  60 tablet  3  . carvedilol (COREG) 3.125 MG tablet Take 1 tablet (3.125 mg total) by mouth 2 (two) times daily with a meal.  60 tablet  3  . digoxin (LANOXIN) 0.125 MG tablet Take 1 tablet (0.125 mg total) by mouth daily.  60 tablet  3  . furosemide (LASIX) 40 MG tablet Take 1 tablet (40 mg total) by mouth 2 (two) times daily. Take 1 tablet (40 MG) in the morning, and 0.5 tablet (20 MG) in the evening.  60 tablet  3  . lisinopril (PRINIVIL,ZESTRIL) 2.5 MG tablet Take 1 tablet (2.5 mg total) by mouth 2 (two) times daily.  60 tablet  3  . spironolactone (ALDACTONE) 25 MG tablet Take 0.5 tablets (12.5 mg total) by mouth daily.  30 tablet  3  . nitroGLYCERIN (NITROSTAT) 0.4 MG SL tablet Place 0.4 mg under the tongue every 5 (five) minutes as needed. For chest pain.         PHYSICAL EXAM: Filed Vitals:   03/31/11 1445  BP: 104/60  Pulse: 76   Weight change:  187 (181)  General: tearful,  ill  appearing. No resp  difficulty HEENT: normal Neck: supple. JVP to ear. Carotids 2+ bilaterally; no bruits. No lymphadenopathy or thryomegaly appreciated. Cor: PMI normal. Regular rate & rhythm. No rubs, s 3 or murmurs. Lungs: clear Abdomen: soft,obese,  nontender, nondistended. No hepatosplenomegaly. No bruits or masses. Good bowel sounds. Extremities: no cyanosis, clubbing, rash,  R and LLE  1+edema Neuro: alert & orientedx3, cranial nerves grossly intact. Moves all 4 extremities w/o difficulty. Affect pleasant.    ECG:   ASSESSMENT & PLAN:

## 2011-03-31 NOTE — Patient Instructions (Addendum)
Take Lasix 40 mg twice a day  Take Metolazone 2.5 mg daily for the next two days  Take K-dur 20 meq twice a day   Follow up next week  Do the following things EVERYDAY: 1) Weigh yourself in the morning before breakfast. Write it down and keep it in a log. 2) Take your medicines as prescribed 3) Eat low salt foods-Limit salt (sodium) to 2000mg  per day.  4) Stay as active as you can everyday

## 2011-03-31 NOTE — Assessment & Plan Note (Addendum)
NYHA IIIB. Volume status elevated despite diet and medication compliance.  Instructed to take Metolazone 2.5 mg daily for the next two days. Increase Lasix 40 mg twice a day. Reviewed 03/21/11 lab work Potassium 3.7. Will start K-Dur 20 meq bid. She will follow up next week and will re-check BMET and volume status.    Patient seen and examined with Tonye Becket, NP. We discussed all aspects of the encounter. I agree with the assessment and plan as stated above.  She is extremely tenuous with advanced HF symptoms and volume overload. Currently not candidate for advanced therapies due to recent substance use. Will add metolazone and increase lasix. BP too low to titrate other meds. May need to consider home inotropes soon. Reinforced need for daily weights and reviewed use of sliding scale diuretics. Discussed need to remain abstinent from substance use.

## 2011-04-06 NOTE — Assessment & Plan Note (Signed)
Counseled on need for abstinence.

## 2011-04-06 NOTE — Assessment & Plan Note (Signed)
No evidence of ischemia. Continue current regimen.   

## 2011-04-06 NOTE — Assessment & Plan Note (Signed)
ICD in place. No recent firing.

## 2011-04-07 ENCOUNTER — Ambulatory Visit (HOSPITAL_COMMUNITY)
Admission: RE | Admit: 2011-04-07 | Discharge: 2011-04-07 | Disposition: A | Discharge status: Home / Self Care | Payer: Medicaid Other | Source: Ambulatory Visit | Attending: Internal Medicine | Admitting: Internal Medicine

## 2011-04-07 VITALS — BP 82/42 | HR 81 | Wt 179.2 lb

## 2011-04-07 DIAGNOSIS — F1010 Alcohol abuse, uncomplicated: Secondary | ICD-10-CM | POA: Insufficient documentation

## 2011-04-07 DIAGNOSIS — F1420 Cocaine dependence, uncomplicated: Secondary | ICD-10-CM | POA: Insufficient documentation

## 2011-04-07 DIAGNOSIS — I10 Essential (primary) hypertension: Secondary | ICD-10-CM | POA: Insufficient documentation

## 2011-04-07 DIAGNOSIS — F17200 Nicotine dependence, unspecified, uncomplicated: Secondary | ICD-10-CM | POA: Insufficient documentation

## 2011-04-07 DIAGNOSIS — I2510 Atherosclerotic heart disease of native coronary artery without angina pectoris: Secondary | ICD-10-CM | POA: Insufficient documentation

## 2011-04-07 DIAGNOSIS — I252 Old myocardial infarction: Secondary | ICD-10-CM | POA: Insufficient documentation

## 2011-04-07 DIAGNOSIS — F1910 Other psychoactive substance abuse, uncomplicated: Secondary | ICD-10-CM

## 2011-04-07 DIAGNOSIS — I5022 Chronic systolic (congestive) heart failure: Secondary | ICD-10-CM

## 2011-04-07 LAB — BASIC METABOLIC PANEL
CO2: 25 mEq/L (ref 19–32)
Calcium: 11 mg/dL — ABNORMAL HIGH (ref 8.4–10.5)
Chloride: 97 mEq/L (ref 96–112)
Creatinine, Ser: 1.62 mg/dL — ABNORMAL HIGH (ref 0.50–1.10)
GFR calc Af Amer: 41 mL/min — ABNORMAL LOW (ref >90)
Sodium: 137 mEq/L (ref 135–145)

## 2011-04-07 LAB — RAPID URINE DRUG SCREEN, HOSP PERFORMED
Amphetamines: NOT DETECTED
Benzodiazepines: NOT DETECTED
Opiates: NOT DETECTED

## 2011-04-07 MED ORDER — SPIRONOLACTONE 25 MG PO TABS: 25.0000 mg | ORAL_TABLET | Freq: Every day | ORAL | Status: DC

## 2011-04-07 NOTE — Patient Instructions (Addendum)
Take Spironolactone 25 mg daily  Order Exercise Stress CPX test   Follow up 1 month  Do the following things EVERYDAY: 1) Weigh yourself in the morning before breakfast. Write it down and keep it in a log. 2) Take your medicines as prescribed 3) Eat low salt foods-Limit salt (sodium) to 2000mg  per day.  4) Stay as active as you can everyday

## 2011-04-07 NOTE — Progress Notes (Signed)
Patient ID: [name], female   DOB: 05-Apr-1955, 56 y.o.   MRN: [id number] Patient ID: [name], female   DOB: 1955-11-26, 56 y.o.   MRN: [id number] HPI: 56 year old female with history of HTN, DM2, polysubstance abuse (ETOH, tobacco, cocaine) and PAD. 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 At time of cath LAD, LCX and RCA occluded. EF 45%. Had abrupt stent occlusion the next day and had to go back to the lab. Had PCI of LAD and then underwent CABG x 5 with mitral valve annuloplasty. Has not been cathed since.   Followed by [facility]. ECHO 2011 EF 20-25% -> ICD placed.  Has not been compliant with meds and has been unable to pay for her card so d/c'd from [facility] in May 2012. Told to estbalish with another cardiologist but has not done so. Last admitted for CHF in July 2012. Cocaine + at that time. Continues to use cocaine at least 1x/month.  She was admitted 03/14/11 due to ADHF with Pro-BNP 3766. Apparently ran out of lisinopril. Still had lasix and spiro. Has not been on b-blocker due to cocaine use. Echo 1/13 EF 15-20%.   RHCLR and L Heart Cath 03/17/11  RA = 8  RV = 47/1/9  PA = 52/12 (28)  PCW = 20 no significant v-waves  Fick cardiac output/index = 4.2/2.2  PVR =1.6 Woods  FA sat = 95%  PA sat = 61%, 63%  Ao Pressure: 89/58 (71)  LV Pressure: 86/12/24  Severe LV dysfunction EF 10-15% Severe native CAD with all bypass grafts patent   She is here for follow up. Breathing better. Occasional SOB. Now able to walk 1/2 mile with mild dyspnea.  Last visit she required Metolazone  2.5 mg times two days.  . Weight has decreased  Form 187 to 177 pounds.  Denies PND/Orthopnea.  She denies tobacco and ETOH use. Denies drug abuse. She is trying to follow low salt diet. She now has Medicaid since November 2012. [facility] She is a single mother with 2 children. Compliant with Medications.     ROS: All  systems negative except as listed in HPI, PMH and Problem List.  Past Medical History  Diagnosis Date  . Coronary artery disease   . Diabetes mellitus   . Hypertension     Current Outpatient Prescriptions  Medication Sig Dispense Refill  . aspirin 81 MG chewable tablet Chew 1 tablet (81 mg total) by mouth daily.  60 tablet  3  . carvedilol (COREG) 3.125 MG tablet Take 1 tablet (3.125 mg total) by mouth 2 (two) times daily with a meal.  60 tablet  3  . digoxin (LANOXIN) 0.125 MG tablet Take 1 tablet (0.125 mg total) by mouth daily.  60 tablet  3  . furosemide (LASIX) 40 MG tablet Take 40 mg by mouth 2 (two) times daily.      [name] lisinopril (PRINIVIL,ZESTRIL) 2.5 MG tablet Take 1 tablet (2.5 mg total) by mouth 2 (two) times daily.  60 tablet  3  . metolazone (ZAROXOLYN) 2.5 MG tablet As directed by Heart Failure Doctor  8 tablet  3  . nitroGLYCERIN (NITROSTAT) 0.4 MG SL tablet Place 0.4 mg under the tongue every 5 (five) minutes as needed. For chest pain.      . potassium chloride SA (K-DUR,KLOR-CON) 20 MEQ tablet Take 20 mEq by mouth daily.      [name]  spironolactone (ALDACTONE) 25 MG tablet Take 0.5 tablets (12.5 mg total) by mouth daily.  30 tablet  3     PHYSICAL EXAM: Filed Vitals:   04/07/11 0935  BP: 82/42  Pulse: 81   Weight change:  179 pounds (187)  General: well appearing. No resp difficulty HEENT: normal Neck: supple. JVP to 6-7 Carotids 2+ bilaterally; no bruits. No lymphadenopathy or thryomegaly appreciated. Cor: PMI normal. Regular rate & rhythm. No rubs,  softs 3 or murmurs. Lungs: clear Abdomen: soft,obese,  nontender, nondistended. No hepatosplenomegaly. No bruits or masses. Good bowel sounds. Extremities: no cyanosis, clubbing, rash,  edema Neuro: alert & orientedx3, cranial nerves grossly intact. Moves all 4 extremities w/o difficulty. Affect pleasant.    ECG:   ASSESSMENT & PLAN:

## 2011-04-07 NOTE — Assessment & Plan Note (Addendum)
NYHA IIIB. Volume status improved. Increase Spironolactone 25 mg daily. Unable to titrate ACE and Beta Blocker due soft blood pressure . Will order CPX. Check BMET, urine drug screen, and serum cotinine.   Patient seen and examined with Tonye Becket, NP. We discussed all aspects of the encounter. I agree with the assessment and plan as stated above.  Her volume status is improved. However she remains quite tenuous. Long talk with her and her sister about her condition and likely need for advanced therapies (LVAD) in the near future. Will check CPX. We discussed her substance use again and reinforced need for abstinence which she states she is. Will check UDS and serum cotinine regularly.   Total MD time spent = 40 mins with over 70% of that time dedicated to counseling and discussions described above.

## 2011-04-08 ENCOUNTER — Telehealth (HOSPITAL_COMMUNITY): Payer: Self-pay | Admitting: Adult Health

## 2011-04-08 ENCOUNTER — Other Ambulatory Visit (HOSPITAL_COMMUNITY): Payer: Self-pay | Admitting: Adult Health

## 2011-04-08 LAB — NICOTINE/COTININE METABOLITES: Cotinine: <10 ng/mL

## 2011-04-08 NOTE — Assessment & Plan Note (Signed)
Reinforced need for abstinence. Check UDS and serum cotinine.

## 2011-04-08 NOTE — Telephone Encounter (Signed)
Instructed to stop taking Potassium because her potassium 5.2. Miss Robinson verbalized understanding instructions.

## 2011-04-10 ENCOUNTER — Ambulatory Visit (HOSPITAL_COMMUNITY): Payer: Medicaid Other | Attending: Adult Health

## 2011-04-10 DIAGNOSIS — I5022 Chronic systolic (congestive) heart failure: Secondary | ICD-10-CM | POA: Insufficient documentation

## 2011-04-17 ENCOUNTER — Telehealth (HOSPITAL_COMMUNITY): Payer: Self-pay | Admitting: *Deleted

## 2011-04-17 MED ORDER — SPIRONOLACTONE 25 MG PO TABS: 25.0000 mg | ORAL_TABLET | Freq: Every day | ORAL | Status: DC

## 2011-04-17 MED ORDER — LISINOPRIL 2.5 MG PO TABS: 2.5000 mg | ORAL_TABLET | Freq: Two times a day (BID) | ORAL | Status: DC

## 2011-04-17 MED ORDER — CARVEDILOL 3.125 MG PO TABS: 3.1250 mg | ORAL_TABLET | Freq: Two times a day (BID) | ORAL | Status: DC

## 2011-04-17 MED ORDER — DIGOXIN 125 MCG PO TABS: 0.1250 mg | ORAL_TABLET | Freq: Every day | ORAL | Status: DC

## 2011-04-17 MED ORDER — FUROSEMIDE 40 MG PO TABS: 40.0000 mg | ORAL_TABLET | Freq: Two times a day (BID) | ORAL | Status: DC

## 2011-04-17 NOTE — Telephone Encounter (Signed)
Ms Mathias called today.  She needs refills on several of her medications; spironolactone, furosemide, carvedilol, lisinopril, & digoxin.  Thanks.

## 2011-04-17 NOTE — Telephone Encounter (Signed)
Prescriptions sent in, pt aware 

## 2011-04-21 NOTE — Progress Notes (Signed)
Patient seen and examined with Amy Clegg, NP. We discussed all aspects of the encounter. I agree with the assessment and plan as stated above.   

## 2011-05-05 ENCOUNTER — Ambulatory Visit (HOSPITAL_COMMUNITY)
Admission: RE | Admit: 2011-05-05 | Discharge: 2011-05-05 | Disposition: A | Discharge status: Home / Self Care | Payer: Medicaid Other | Source: Ambulatory Visit | Attending: Internal Medicine | Admitting: Internal Medicine

## 2011-05-05 ENCOUNTER — Encounter (HOSPITAL_COMMUNITY): Payer: Self-pay

## 2011-05-05 VITALS — BP 102/58 | HR 69 | Wt 178.8 lb

## 2011-05-05 DIAGNOSIS — I5022 Chronic systolic (congestive) heart failure: Secondary | ICD-10-CM | POA: Insufficient documentation

## 2011-05-05 DIAGNOSIS — Z9861 Coronary angioplasty status: Secondary | ICD-10-CM | POA: Insufficient documentation

## 2011-05-05 DIAGNOSIS — I2510 Atherosclerotic heart disease of native coronary artery without angina pectoris: Secondary | ICD-10-CM | POA: Insufficient documentation

## 2011-05-05 DIAGNOSIS — I509 Heart failure, unspecified: Secondary | ICD-10-CM | POA: Insufficient documentation

## 2011-05-05 DIAGNOSIS — E119 Type 2 diabetes mellitus without complications: Secondary | ICD-10-CM | POA: Insufficient documentation

## 2011-05-05 DIAGNOSIS — F1010 Alcohol abuse, uncomplicated: Secondary | ICD-10-CM | POA: Insufficient documentation

## 2011-05-05 DIAGNOSIS — Z9119 Patient's noncompliance with other medical treatment and regimen: Secondary | ICD-10-CM | POA: Insufficient documentation

## 2011-05-05 DIAGNOSIS — F1410 Cocaine abuse, uncomplicated: Secondary | ICD-10-CM | POA: Insufficient documentation

## 2011-05-05 DIAGNOSIS — Z7982 Long term (current) use of aspirin: Secondary | ICD-10-CM | POA: Insufficient documentation

## 2011-05-05 DIAGNOSIS — F17200 Nicotine dependence, unspecified, uncomplicated: Secondary | ICD-10-CM | POA: Insufficient documentation

## 2011-05-05 DIAGNOSIS — I252 Old myocardial infarction: Secondary | ICD-10-CM | POA: Insufficient documentation

## 2011-05-05 DIAGNOSIS — I10 Essential (primary) hypertension: Secondary | ICD-10-CM | POA: Insufficient documentation

## 2011-05-05 DIAGNOSIS — I739 Peripheral vascular disease, unspecified: Secondary | ICD-10-CM | POA: Insufficient documentation

## 2011-05-05 DIAGNOSIS — Z91199 Patient's noncompliance with other medical treatment and regimen due to unspecified reason: Secondary | ICD-10-CM | POA: Insufficient documentation

## 2011-05-05 LAB — BASIC METABOLIC PANEL
BUN: 22 mg/dL (ref 6–23)
CO2: 29 mEq/L (ref 19–32)
Glucose, Bld: 157 mg/dL — ABNORMAL HIGH (ref 70–99)
Potassium: 5 mEq/L (ref 3.5–5.1)
Sodium: 144 mEq/L (ref 135–145)

## 2011-05-05 MED ORDER — LISINOPRIL 2.5 MG PO TABS: 5.0000 mg | ORAL_TABLET | Freq: Two times a day (BID) | ORAL | Status: DC

## 2011-05-05 NOTE — Assessment & Plan Note (Addendum)
NYHA III. Volume status stable. Discussed CPX test results. She is in the range for advanced therapies however she is able to perform ADLs and ambulate without difficulty. Lengthy discussion with her sister about her options such as Advanced Therapies or home inotropes.  Will continue continue current medications with close follow up. Increase Lisinopril 5 mg twice a day. Follow up in 3 weeks with [facility].   Patient seen and examined with Tonye Becket, NP. We discussed all aspects of the encounter. I agree with the assessment and plan as stated above. We reviewed CPX test with her at length. Shows high-risk features and is in range for advanced therapies. However she is not a transplant candidate at this time and is not interested in VAD. Symptoms somewhat improved so we will continue aggressive medical therapy with close monitoring of renal function. Volume status looks good on exam. Increase lisinopril to 5 bid.

## 2011-05-05 NOTE — Patient Instructions (Signed)
Take Lisinopril 5 mg twice a day  Do the following things EVERYDAY: 1) Weigh yourself in the morning before breakfast. Write it down and keep it in a log. 2) Take your medicines as prescribed 3) Eat low salt foods-Limit salt (sodium) to 2000mg  per day.  4) Stay as active as you can everyday  Follow up 3 weeks

## 2011-05-05 NOTE — Progress Notes (Signed)
Patient ID: [name], female   DOB: 09/11/1955, 56 y.o.   MRN: [id number] HPI: 56 year old female with history of HTN, DM2, polysubstance abuse (ETOH, tobacco, cocaine) and PAD. 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 At time of cath LAD, LCX and RCA occluded. EF 45%. Had abrupt stent occlusion the next day and had to go back to the lab. Had PCI of LAD and then underwent CABG x 5 with mitral valve annuloplasty. Has not been cathed since.  Followed by [facility]. ECHO 2011 EF 20-25% -> ICD placed.  Has not been compliant with meds and has been unable to pay for her card so d/c'd from [facility] in May 2012. Told to estbalish with another cardiologist but has not done so. Last admitted for CHF in July 2012. Cocaine + at that time. Continues to use cocaine at least 1x/month.  She was admitted 03/14/11 due to ADHF with Pro-BNP 3766. Apparently ran out of lisinopril. Still had lasix and spiro. Has not been on b-blocker due to cocaine use. Echo 1/13 EF 15-20%.  [facility] 03/17/11  RA = 8  RV = 47/1/9  PA = 52/12 (28)  PCW = 20 no significant v-waves  Fick cardiac output/index = 4.2/2.2  PVR =1.6 Woods  FA sat = 95%  PA sat = 61%, 63%  Ao Pressure: 89/58 (71)  LV Pressure: 86/12/24   Severe LV dysfunction EF 10-15% Severe native CAD with all bypass grafts patent   Stress Test (on Carvedilol)  Peak VO2: 13.6 % predicted peak VO2: 63.3% VE/VCO2 slope: 44.9 OUES: 1.11 Peak RER: 1.12 Slope 45%  She is here for follow up. Feels ok.  Occasional SOB. Now able to walk 200-300 feet with mild dyspnea. Weight 177. Denies PND/Orthopnea. She denies tobacco and ETOH use. Denies drug abuse. She is trying to follow low salt diet. Set up with [facility] . She is a single mother with 2 children. Compliant with Medications.     ROS: All systems negative except as listed in HPI, PMH and Problem List.  Past Medical History  Diagnosis Date  . Coronary  artery disease   . Diabetes mellitus   . Hypertension     Current Outpatient Prescriptions  Medication Sig Dispense Refill  . aspirin 81 MG chewable tablet Chew 1 tablet (81 mg total) by mouth daily.  60 tablet  3  . carvedilol (COREG) 3.125 MG tablet Take 1 tablet (3.125 mg total) by mouth 2 (two) times daily with a meal.  60 tablet  6  . digoxin (LANOXIN) 0.125 MG tablet Take 1 tablet (0.125 mg total) by mouth daily.  30 tablet  6  . furosemide (LASIX) 40 MG tablet Take 1 tablet (40 mg total) by mouth 2 (two) times daily.  60 tablet  6  . lisinopril (PRINIVIL,ZESTRIL) 2.5 MG tablet Take 1 tablet (2.5 mg total) by mouth 2 (two) times daily.  60 tablet  6  . nitroGLYCERIN (NITROSTAT) 0.4 MG SL tablet Place 0.4 mg under the tongue every 5 (five) minutes as needed. For chest pain.      [name] spironolactone (ALDACTONE) 25 MG tablet Take 1 tablet (25 mg total) by mouth daily.  30 tablet  6  . metolazone (ZAROXOLYN) 2.5 MG tablet As directed by Heart Failure Doctor  8 tablet  3     PHYSICAL EXAM: Filed Vitals:   05/05/11 1116  BP: 102/58  Pulse: 69  Weight change:  General:  Well appearing. No resp difficulty HEENT: normal Neck: supple. JVP 7-8 Carotids 2+ bilaterally; no bruits. No lymphadenopathy or thryomegaly appreciated. Cor: PMI normal. Regular rate & rhythm. No rubs, gallops or murmurs. Lungs: clear Abdomen: soft, nontender, nondistended. No hepatosplenomegaly. No bruits or masses. Good bowel sounds. Extremities: no cyanosis, clubbing, rash, edema Neuro: alert & orientedx3, cranial nerves grossly intact. Moves all 4 extremities w/o difficulty. Affect pleasant.    ECG:   ASSESSMENT & PLAN:

## 2011-05-07 ENCOUNTER — Telehealth (HOSPITAL_COMMUNITY): Payer: Self-pay | Admitting: Vascular Surgery

## 2011-05-07 NOTE — Telephone Encounter (Signed)
Herbert Seta,          Ms. Rahming has not had a B/M in 5 days . She wants to know if there is something she can take .

## 2011-05-07 NOTE — Telephone Encounter (Signed)
Spoke w/pt she will get OTC meds

## 2011-05-21 ENCOUNTER — Encounter: Payer: Self-pay | Admitting: Internal Medicine

## 2011-05-21 ENCOUNTER — Ambulatory Visit (HOSPITAL_COMMUNITY)
Admission: RE | Admit: 2011-05-21 | Discharge: 2011-05-21 | Disposition: A | Discharge status: Home / Self Care | Payer: Medicaid Other | Source: Ambulatory Visit | Attending: Internal Medicine | Admitting: Internal Medicine

## 2011-05-21 ENCOUNTER — Ambulatory Visit (HOSPITAL_COMMUNITY): Payer: Medicaid Other

## 2011-05-21 VITALS — BP 98/60 | HR 75 | Wt 179.5 lb

## 2011-05-21 DIAGNOSIS — M109 Gout, unspecified: Secondary | ICD-10-CM | POA: Insufficient documentation

## 2011-05-21 DIAGNOSIS — I5022 Chronic systolic (congestive) heart failure: Secondary | ICD-10-CM | POA: Insufficient documentation

## 2011-05-21 DIAGNOSIS — M79671 Pain in right foot: Secondary | ICD-10-CM

## 2011-05-21 DIAGNOSIS — M79609 Pain in unspecified limb: Secondary | ICD-10-CM | POA: Insufficient documentation

## 2011-05-21 DIAGNOSIS — R52 Pain, unspecified: Secondary | ICD-10-CM

## 2011-05-21 DIAGNOSIS — I472 Ventricular tachycardia: Secondary | ICD-10-CM

## 2011-05-21 DIAGNOSIS — M7989 Other specified soft tissue disorders: Secondary | ICD-10-CM | POA: Insufficient documentation

## 2011-05-21 MED ORDER — CARVEDILOL 3.125 MG PO TABS: ORAL_TABLET | ORAL | Status: DC

## 2011-05-21 NOTE — Progress Notes (Signed)
*  PRELIMINARY RESULTS* Vascular Ultrasound Right lower extremity venous duplex has been completed.  Preliminary findings: Right= No evidence of DVT or baker's cyst.  [name] RDMS, RDCS [name] RDMS 05/21/2011, 12:58 PM

## 2011-05-21 NOTE — Assessment & Plan Note (Addendum)
Device interrogated in clinic and reviewed with Dr. Gala Romney. Several brief runs of NSVT. No sustained VT. No diagnostics on device.

## 2011-05-21 NOTE — Assessment & Plan Note (Signed)
NYHA III. Volume status stable. Vital signs reviewed will increase Carvedilol 6.25 mg at night. Follow up in 3 weeks.

## 2011-05-21 NOTE — Assessment & Plan Note (Signed)
RLE and R foot pain. Will check lower extremity doppler

## 2011-05-21 NOTE — Patient Instructions (Signed)
Take Carvedilol one tablet in am and two tablets in pm  Please order RLE venous doppler  Follow up in 3 weeks  Do the following things EVERYDAY: 1) Weigh yourself in the morning before breakfast. Write it down and keep it in a log. 2) Take your medicines as prescribed 3) Eat low salt foods--Limit salt (sodium) to 2000mg  per day.  4) Stay as active as you can everyday

## 2011-05-21 NOTE — Progress Notes (Signed)
Patient ID: [name], female   DOB: [birth year], 56 y.o.   MRN: [id number] HPI: 56 year old female with history of HTN, DM2, polysubstance abuse (ETOH, tobacco, cocaine) and PAD. 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 At time of cath LAD, LCX and RCA occluded. EF 45%. Had abrupt stent occlusion the next day and had to go back to the lab. Had PCI of LAD and then underwent CABG x 5 with mitral valve annuloplasty.  ECHO 2011 EF 20-25% -> ICD placed.   [facility] Admit 09/2010 Cocaine + at that time.  She was admitted 03/14/11 due to ADHF with Pro-BNP 3766. Apparently ran out of lisinopril. Still had lasix and spiro. Has not been on b-blocker due to cocaine use. Echo 1/13 EF 15-20%.   [facility] 03/17/11  RA = 8  RV = 47/1/9  PA = 52/12 (28)  PCW = 20 no significant v-waves  Fick cardiac output/index = 4.2/2.2  PVR =1.6 Woods  FA sat = 95%  PA sat = 61%, 63%  Ao Pressure: 89/58 (71)  LV Pressure: 86/12/24   Severe LV dysfunction EF 10-15% Severe native CAD with all bypass grafts patent   Stress Test (on Carvedilol)  Peak VO2: 13.6 % predicted peak VO2: 63.3% VE/VCO2 slope: 44.9 OUES: 1.11 Peak RER: 1.12 Slope 45%  She is here for follow up. Last office visit Lisinopril increased to 5 mg twice a day. Complains of R  foot pain for the last few days. Pain 10/10. SOB on exertion. Denies PND/Orhtopnea.  Weight at home 176-178 pounds. No drug and or alcohol use. She is trying to follow low salt diet. Set up with [facility]. She is a single mother with 2 children. Compliant with Medications. Sister very supportive.     ROS: All systems negative except as listed in HPI, PMH and Problem List.  Past Medical History  Diagnosis Date  . Coronary artery disease   . Diabetes mellitus   . Hypertension     Current Outpatient Prescriptions  Medication Sig Dispense Refill  . aspirin 81 MG chewable tablet Chew 1 tablet (81 mg total) by mouth daily.  60  tablet  3  . carvedilol (COREG) 3.125 MG tablet Take 1 tablet (3.125 mg total) by mouth 2 (two) times daily with a meal.  60 tablet  6  . digoxin (LANOXIN) 0.125 MG tablet Take 1 tablet (0.125 mg total) by mouth daily.  30 tablet  6  . furosemide (LASIX) 40 MG tablet Take 1 tablet (40 mg total) by mouth 2 (two) times daily.  60 tablet  6  . lisinopril (PRINIVIL,ZESTRIL) 2.5 MG tablet Take 2 tablets (5 mg total) by mouth 2 (two) times daily.  60 tablet  6  . metolazone (ZAROXOLYN) 2.5 MG tablet As directed by Heart Failure Doctor  8 tablet  3  . nitroGLYCERIN (NITROSTAT) 0.4 MG SL tablet Place 0.4 mg under the tongue every 5 (five) minutes as needed. For chest pain.      [name] spironolactone (ALDACTONE) 25 MG tablet Take 1 tablet (25 mg total) by mouth daily.  30 tablet  6     PHYSICAL EXAM: Filed Vitals:   05/21/11 1044  BP: 98/60  Pulse: 75   Weight change:  179 (178) General:  Well appearing. No resp difficulty HEENT: normal Neck: supple. JVP 5-6 Carotids 2+ bilaterally; no bruits. No lymphadenopathy or thryomegaly appreciated. Cor: PMI normal. Regular rate & rhythm. No  rubs, gallops or murmurs. Lungs: clear Abdomen: soft, nontender, nondistended. No hepatosplenomegaly. No bruits or masses. Good bowel sounds. Extremities: no cyanosis, clubbing, rash, edema RLE doppler pedal/post tib pulse Neuro: alert & orientedx3, cranial nerves grossly intact. Moves all 4 extremities w/o difficulty. Affect pleasant.       ASSESSMENT & PLAN:

## 2011-05-29 ENCOUNTER — Telehealth (HOSPITAL_COMMUNITY): Payer: Self-pay | Admitting: *Deleted

## 2011-05-29 MED ORDER — PREDNISONE 20 MG PO TABS: 40.0000 mg | ORAL_TABLET | Freq: Every day | ORAL | Status: AC

## 2011-05-29 NOTE — Telephone Encounter (Signed)
Per Ulyess Blossom, PA give pt Prednisone 40 mg daily for 3 days

## 2011-05-29 NOTE — Telephone Encounter (Signed)
[name] called today.  She said that the gout in her right leg has moved to her left leg.  She is looking to see if we can give her anything for the pain and swelling.  She does not have a primary care physician.  Please call her back.  Thank you.

## 2011-06-03 ENCOUNTER — Telehealth (HOSPITAL_COMMUNITY): Payer: Self-pay | Admitting: *Deleted

## 2011-06-03 NOTE — Telephone Encounter (Signed)
[name] called today.  She has gained 3 lbs.  She would like a call back.  Thanks.

## 2011-06-03 NOTE — Telephone Encounter (Signed)
Spoke w/pt she states she has gained 3 lbs over the weekend and states her legs are very swollen, she wanted to make sure it was ok to take a metolazone advised 1 dose was fine if doesn't help to call me back

## 2011-06-05 ENCOUNTER — Telehealth (HOSPITAL_COMMUNITY): Payer: Self-pay | Admitting: *Deleted

## 2011-06-05 NOTE — Telephone Encounter (Signed)
[name] called today.  She said that the medicine she took over the weekend worked well for her gout, however, she is feeling the pain coming back.  She would like a call back. Thanks.

## 2011-06-06 NOTE — Telephone Encounter (Signed)
Left mess on ID VM that we do not feel comfortable giving her more prednisone since she hasn't been seen, she is scheduled to see [place] on Mon if pain is bad she should see  pcp or urgent care over the weekend

## 2011-06-09 ENCOUNTER — Ambulatory Visit (HOSPITAL_COMMUNITY)
Admission: RE | Admit: 2011-06-09 | Discharge: 2011-06-09 | Disposition: A | Discharge status: Home / Self Care | Payer: Medicaid Other | Source: Ambulatory Visit | Attending: Internal Medicine | Admitting: Internal Medicine

## 2011-06-09 ENCOUNTER — Encounter (HOSPITAL_COMMUNITY): Payer: Self-pay

## 2011-06-09 VITALS — BP 92/56 | HR 77 | Wt 174.0 lb

## 2011-06-09 DIAGNOSIS — M79671 Pain in right foot: Secondary | ICD-10-CM

## 2011-06-09 DIAGNOSIS — I2510 Atherosclerotic heart disease of native coronary artery without angina pectoris: Secondary | ICD-10-CM

## 2011-06-09 DIAGNOSIS — M79609 Pain in unspecified limb: Secondary | ICD-10-CM

## 2011-06-09 DIAGNOSIS — I5022 Chronic systolic (congestive) heart failure: Secondary | ICD-10-CM

## 2011-06-09 LAB — BASIC METABOLIC PANEL
BUN: 53 mg/dL — ABNORMAL HIGH (ref 6–23)
CO2: 23 mEq/L (ref 19–32)
Glucose, Bld: 543 mg/dL — ABNORMAL HIGH (ref 70–99)
Potassium: 5.2 mEq/L — ABNORMAL HIGH (ref 3.5–5.1)
Sodium: 123 mEq/L — ABNORMAL LOW (ref 135–145)

## 2011-06-09 MED ORDER — CARVEDILOL 6.25 MG PO TABS: 6.2500 mg | ORAL_TABLET | Freq: Two times a day (BID) | ORAL | Status: DC

## 2011-06-09 MED ORDER — ALLOPURINOL 100 MG PO TABS: 200.0000 mg | ORAL_TABLET | Freq: Every day | ORAL | Status: DC

## 2011-06-09 NOTE — Patient Instructions (Signed)
Take Allopurinol 200 mg daily  Take Carvedilol 6.25 mg twice a day  Do the following things EVERYDAY: 1) Weigh yourself in the morning before breakfast. Write it down and keep it in a log. 2) Take your medicines as prescribed 3) Eat low salt foods--Limit salt (sodium) to 2000mg  per day.  4) Stay as active as you can everyday  Follow up in 6 weeks.

## 2011-06-09 NOTE — Assessment & Plan Note (Signed)
She is doing great and compliant with medication and remains drug free. Volume status is stable. Will increase Carvedilol 6.25 mg twice a day. Discussed potential side effects associated with Carvedilol increase such as fluid retention and fatigue. Re-educated to limit fluid intake to less than 2 liters of fluid per day. Follow up in 6 weeks.

## 2011-06-09 NOTE — Progress Notes (Signed)
Patient ID: [name], female   DOB: 1955/06/05, 56 y.o.   MRN: [id number] HPI: 56 year old female with history of HTN, DM2, polysubstance abuse (ETOH, tobacco, cocaine) and PAD. 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 At time of cath LAD, LCX and RCA occluded. EF 45%. Had abrupt stent occlusion the next day and had to go back to the lab. Had PCI of LAD and then underwent CABG x 5 with mitral valve annuloplasty.  ECHO 2011 EF 20-25% -> ICD placed.   [facility] Admit 09/2010 Cocaine + at that time.  03/14/11 Admitted due to ADHF with Pro-BNP 3766. Apparently ran out of lisinopril. Still had lasix and spiro. Has not been on b-blocker due to cocaine use. Echo 1/13 EF 15-20%.   [facility] 03/17/11  RA = 8  RV = 47/1/9  PA = 52/12 (28)  PCW = 20 no significant v-waves  Fick cardiac output/index = 4.2/2.2  PVR =1.6 Woods  FA sat = 95%  PA sat = 61%, 63%  Ao Pressure: 89/58 (71)  LV Pressure: 86/12/24   Severe LV dysfunction EF 10-15% Severe native CAD with all bypass grafts patent   Stress Test (on Carvedilol)  Peak VO2: 13.6 % predicted peak VO2: 63.3% VE/VCO2 slope: 44.9 OUES: 1.11Peak RER: 1.12 Slope 45%   05/21/11 Lower extremity doppler negative for DVT  She is here for follow up. Last office visit Carvedilol increased to 6.25 mg at night. Denies SOB/orthopnea/PND/CP. Occasional dizziness. Complains of R foot pain. She received 3 days of prednisone which improved R foot pain. She has had 2 doses of Metolazone over the last month. Weight at home 176-181. Complaint with medications. She will establish with PCP at [facility] this week.    ROS: All systems negative except as listed in HPI, PMH and Problem List.  Past Medical History  Diagnosis Date  . Coronary artery disease   . Diabetes mellitus   . Hypertension     Current Outpatient Prescriptions  Medication Sig Dispense Refill  . aspirin 81 MG chewable tablet Chew 1 tablet (81 mg  total) by mouth daily.  60 tablet  3  . carvedilol (COREG) 3.125 MG tablet Take 3.125 mg in am and 6.25 mg in pm  90 tablet  6  . digoxin (LANOXIN) 0.125 MG tablet Take 1 tablet (0.125 mg total) by mouth daily.  30 tablet  6  . furosemide (LASIX) 40 MG tablet Take 1 tablet (40 mg total) by mouth 2 (two) times daily.  60 tablet  6  . lisinopril (PRINIVIL,ZESTRIL) 2.5 MG tablet Take 2 tablets (5 mg total) by mouth 2 (two) times daily.  60 tablet  6  . spironolactone (ALDACTONE) 25 MG tablet Take 1 tablet (25 mg total) by mouth daily.  30 tablet  6  . metolazone (ZAROXOLYN) 2.5 MG tablet As directed by Heart Failure Doctor  8 tablet  3  . nitroGLYCERIN (NITROSTAT) 0.4 MG SL tablet Place 0.4 mg under the tongue every 5 (five) minutes as needed. For chest pain.         PHYSICAL EXAM: Filed Vitals:   06/09/11 1138  BP: 92/56  Pulse: 77   Weight change:  174 (179) General:  Well appearing. No resp difficulty HEENT: normal Neck: supple. JVP 5-6 Carotids 2+ bilaterally; no bruits. No lymphadenopathy or thryomegaly appreciated. Cor: PMI normal. Regular rate & rhythm. No rubs, gallops or murmurs. Lungs: clear Abdomen: soft, nontender, nondistended.  No hepatosplenomegaly. No bruits or masses. Good bowel sounds. Extremities: no cyanosis, clubbing, rash, edema RLE doppler pedal/post tib pulse Neuro: alert & orientedx3, cranial nerves grossly intact. Moves all 4 extremities w/o difficulty. Affect pleasant.       ASSESSMENT & PLAN:

## 2011-06-09 NOTE — Assessment & Plan Note (Signed)
No evidence of ischemia. Continue current regimen.   

## 2011-06-09 NOTE — Assessment & Plan Note (Signed)
Persistent R foot pain likely gout. Will check uric acid level. Start Allopurinol 200 mg daily.

## 2011-06-12 ENCOUNTER — Other Ambulatory Visit (HOSPITAL_COMMUNITY)
Admission: RE | Admit: 2011-06-12 | Discharge: 2011-06-12 | Disposition: A | Discharge status: Home / Self Care | Payer: Medicaid Other | Source: Ambulatory Visit | Attending: Family Medicine | Admitting: Family Medicine

## 2011-06-12 ENCOUNTER — Other Ambulatory Visit: Payer: Self-pay

## 2011-06-12 ENCOUNTER — Other Ambulatory Visit (HOSPITAL_COMMUNITY): Payer: Self-pay | Admitting: Family Medicine

## 2011-06-12 DIAGNOSIS — Z1231 Encounter for screening mammogram for malignant neoplasm of breast: Secondary | ICD-10-CM

## 2011-06-12 DIAGNOSIS — Z01419 Encounter for gynecological examination (general) (routine) without abnormal findings: Secondary | ICD-10-CM | POA: Insufficient documentation

## 2011-06-12 DIAGNOSIS — Z0000 Encounter for general adult medical examination without abnormal findings: Secondary | ICD-10-CM

## 2011-06-16 ENCOUNTER — Telehealth (HOSPITAL_COMMUNITY): Payer: Self-pay | Admitting: *Deleted

## 2011-06-16 NOTE — Telephone Encounter (Signed)
[name] called today.  She is having increased dizziness with the changes to her medication.  She wants you to be aware.  Thanks.

## 2011-06-16 NOTE — Telephone Encounter (Signed)
Spoke w/pt she increased her carvedilol on Sat. adn she has been c/o dizziness since then, she is unable to check her BP, she does state her wt is down to 171 today, she is on her way to [facility] now and will check her BP while there, I will touch base w/her tomorrow to f/u

## 2011-06-17 MED ORDER — CARVEDILOL 6.25 MG PO TABS: ORAL_TABLET | ORAL | Status: DC

## 2011-06-17 NOTE — Telephone Encounter (Signed)
Spoke w/pt earlier today and she reported that her BP yesterday was 93/66, she states she gets dizzy every am after taking meds but feels much better in the afternoon, discussed w/Amy Clegg, NP and she would like for pt to decrease carvedilol to 3.125 mg in AM and continue the 6.25 mg in PM, pt is aware and verbalizes understanding

## 2011-06-19 ENCOUNTER — Emergency Department (HOSPITAL_COMMUNITY)
Admission: EM | Admit: 2011-06-19 | Discharge: 2011-06-19 | Disposition: A | Discharge status: Home / Self Care | Payer: Medicaid Other | Attending: Emergency Medicine | Admitting: Emergency Medicine

## 2011-06-19 ENCOUNTER — Telehealth (HOSPITAL_COMMUNITY): Payer: Self-pay | Admitting: *Deleted

## 2011-06-19 DIAGNOSIS — Z951 Presence of aortocoronary bypass graft: Secondary | ICD-10-CM | POA: Insufficient documentation

## 2011-06-19 DIAGNOSIS — E1165 Type 2 diabetes mellitus with hyperglycemia: Secondary | ICD-10-CM

## 2011-06-19 DIAGNOSIS — IMO0001 Reserved for inherently not codable concepts without codable children: Secondary | ICD-10-CM | POA: Insufficient documentation

## 2011-06-19 DIAGNOSIS — I129 Hypertensive chronic kidney disease with stage 1 through stage 4 chronic kidney disease, or unspecified chronic kidney disease: Secondary | ICD-10-CM | POA: Insufficient documentation

## 2011-06-19 DIAGNOSIS — I2510 Atherosclerotic heart disease of native coronary artery without angina pectoris: Secondary | ICD-10-CM | POA: Insufficient documentation

## 2011-06-19 DIAGNOSIS — I447 Left bundle-branch block, unspecified: Secondary | ICD-10-CM | POA: Insufficient documentation

## 2011-06-19 DIAGNOSIS — N189 Chronic kidney disease, unspecified: Secondary | ICD-10-CM

## 2011-06-19 DIAGNOSIS — R42 Dizziness and giddiness: Secondary | ICD-10-CM | POA: Insufficient documentation

## 2011-06-19 LAB — GLUCOSE, CAPILLARY
Glucose-Capillary: 527 mg/dL — ABNORMAL HIGH (ref 70–99)
Glucose-Capillary: 429 mg/dL — ABNORMAL HIGH (ref 70–99)
Glucose-Capillary: 282 mg/dL — ABNORMAL HIGH (ref 70–99)

## 2011-06-19 LAB — CBC
HCT: 41.9 % (ref 36.0–46.0)
Hemoglobin: 15 g/dL (ref 12.0–15.0)
MCH: 29.8 pg (ref 26.0–34.0)
MCHC: 35.8 g/dL (ref 30.0–36.0)
MCV: 83.1 fL (ref 78.0–100.0)
Platelets: 170 10*3/uL (ref 150–400)
RBC: 5.04 MIL/uL (ref 3.87–5.11)
RDW: 15.3 % (ref 11.5–15.5)
WBC: 8.4 10*3/uL (ref 4.0–10.5)

## 2011-06-19 LAB — BASIC METABOLIC PANEL
BUN: 44 mg/dL — ABNORMAL HIGH (ref 6–23)
CO2: 24 mEq/L (ref 19–32)
Calcium: 10.5 mg/dL (ref 8.4–10.5)
Chloride: 84 mEq/L — ABNORMAL LOW (ref 96–112)
Creatinine, Ser: 1.64 mg/dL — ABNORMAL HIGH (ref 0.50–1.10)
GFR calc Af Amer: 40 mL/min — ABNORMAL LOW (ref >90)
GFR calc non Af Amer: 35 mL/min — ABNORMAL LOW (ref >90)
Glucose, Bld: 532 mg/dL — ABNORMAL HIGH (ref 70–99)
Potassium: 5.2 mEq/L — ABNORMAL HIGH (ref 3.5–5.1)
Sodium: 122 mEq/L — ABNORMAL LOW (ref 135–145)

## 2011-06-19 LAB — URINALYSIS, ROUTINE W REFLEX MICROSCOPIC
Bilirubin Urine: NEGATIVE
Glucose, UA: >1000 mg/dL — AB
Hgb urine dipstick: NEGATIVE
Ketones, ur: NEGATIVE mg/dL
Nitrite: NEGATIVE
Protein, ur: 30 mg/dL — AB
Specific Gravity, Urine: 1.021 (ref 1.005–1.030)
Urobilinogen, UA: 1 mg/dL (ref 0.0–1.0)
pH: 5 (ref 5.0–8.0)

## 2011-06-19 LAB — URINE MICROSCOPIC-ADD ON

## 2011-06-19 LAB — TROPONIN I: Troponin I: <0.3 ng/mL (ref <0.30)

## 2011-06-19 LAB — DIGOXIN LEVEL: Digoxin Level: 1.2 ng/mL (ref 0.8–2.0)

## 2011-06-19 MED ORDER — METFORMIN HCL 500 MG PO TABS: 500.0000 mg | ORAL_TABLET | Freq: Two times a day (BID) | ORAL | Status: DC

## 2011-06-19 MED ORDER — INSULIN ASPART 100 UNIT/ML ~~LOC~~ SOLN
7.0000 [IU] | Freq: Once | SUBCUTANEOUS | Status: AC
  Administered 2011-06-19: 7 [IU] via INTRAVENOUS
  Filled 2011-06-19: qty 1

## 2011-06-19 MED ORDER — INSULIN ASPART 100 UNIT/ML ~~LOC~~ SOLN
10.0000 [IU] | Freq: Once | SUBCUTANEOUS | Status: AC
  Administered 2011-06-19: 10 [IU] via INTRAVENOUS
  Filled 2011-06-19: qty 1

## 2011-06-19 MED ORDER — METFORMIN HCL 500 MG PO TABS
500.0000 mg | ORAL_TABLET | Freq: Once | ORAL | Status: AC
  Administered 2011-06-19: 500 mg via ORAL
  Filled 2011-06-19: qty 1

## 2011-06-19 MED ORDER — SODIUM CHLORIDE 0.9 % IV BOLUS (SEPSIS)
1000.0000 mL | Freq: Once | INTRAVENOUS | Status: AC
  Administered 2011-06-19: 1000 mL via INTRAVENOUS

## 2011-06-19 NOTE — Discharge Instructions (Signed)
Chronic Renal Insufficiency Chronic renal insufficiency (also called kidney failure) occurs when there is kidney damage done. The damage prevents the kidneys from working like they should.  The kidneys do many important things. They:  Filter waste out of the blood.   Regulate the amount of water and various salts in the blood stream.   Produce chemicals that:   Prompt the bone marrow to make red blood cells.   Regulate blood pressure.   Keep calcium in balance throughout the bones and the body.  When the kidneys are damaged, they can no longer filter waste products out of the blood. These substances build up in the blood, causing illness.  CAUSES   Diabetes.   High blood pressure.   Glomerular diseases: Conditions that damage the tiny blood vessels (glomeruli) within the kidneys, such as:   Membranous nephropathy.   IgA nephropathy.   Focal segmental glomerulosclerosis.   Poisons (such as overdoses or misuse of acetaminophen or NSAIDS, or exposure to other toxic substances).   Kidney injuries.   Kidney cancer or cancer that spreads to the kidney.   Medications such as NSAIDs. These problems are rare.   Kidney stones.   Alport disease.   Polycystic kidneys.  SYMPTOMS  Most people do not notice symptoms of kidney failure until their kidney function drops below about 30-40% of normal. Symptoms can include:  Weakness.   Tiredness.   Frequent urination.   Intense need to urinate.   Excess bruising.   Low urine production.   Blood in the urine.   Pain in the kidney area.   Feeling sick to your stomach (nausea).   Vomiting.   Unusual bleeding.   Numbness in hands and feet.   Swelling in legs, arms and face.   Confusion.  DIAGNOSIS  Your caregiver will look for signs of kidney failure. Tests to diagnose kidney failure may include:  Urine tests: May reveal the presence of blood, protein or sugar.   Blood tests: May show low red blood cell count  (anemia) or high levels of waste products (BUN and creatinine) that are normally filtered out of the bloodstream by the kidneys.   Imaging tests - These are tests that create pictures of the organs inside the abdomen, such as the kidneys. They may reveal masses growing in the kidneys or blockages to the flow of urine. Possible imaging tests may include:   Ultrasound.   CT scan.   MRI.   Intravenous pyelogram or IVP. This is a test that involves injecting dye into the bloodstream and then taking a series of x-rays of the kidneys. This allows the kidneys and other parts of the urinary system to be viewed more clearly.   Kidney biopsy - A small sample of kidney is removed using a special needle. The sample is examined for abnormalities under a microscope.  TREATMENT  Chronic kidney failure cannot usually be cured. The various symptoms are treated, and measures are taken to avoid further kidney damage. Treatment for mild to moderate kidney failure may include:  Medication for high blood pressure.   Good control of diabetes.   Medication and diet change to improve anemia.   A low-sodium, low-potassium, low-protein and/or low-cholesterol diet.   Limiting the quantity of liquids in the diet.  Treatment for more severe kidney failure may require:  Dialysis - Mechanical methods of filtering the blood.   Kidney transplant - An operation that removes the diseased kidney and replaces it with a donated kidney.  HOME  CARE INSTRUCTIONS   Take medication as told by your caregiver.   Quit smoking if you are a smoker. Talk to your caregiver about a smoking cessation program.   Follow your prescribed diet.   If you are prescribed vitamins, take them as told.  SEEK IMMEDIATE MEDICAL CARE IF:  You start to produce less urine.   You notice blood in your urine.   You have increased pain.   You have increased weakness, fatigue or confusion.   You notice new swelling.   You develop a  fever.   You feel that you are having side effects of medicines prescribed.  Document Released: 11/27/2007 Document Revised: 02/06/2011 Document Reviewed: 03/11/2010 [facility] Patient Information 2012 [place], [place].Blood Sugar Monitoring, Adult GLUCOSE METERS FOR SELF-MONITORING OF BLOOD GLUCOSE  It is important to be able to correctly measure your blood sugar (glucose). You can use a blood glucose monitor (a small battery-operated device) to check your glucose level at any time. This allows you and your caregiver to monitor your diabetes and to determine how well your treatment plan is working. The process of monitoring your blood glucose with a glucose meter is called self-monitoring of blood glucose (SMBG). When people with diabetes control their blood sugar, they have better health. To test for glucose with a typical glucose meter, place the disposable strip in the meter. Then place a small sample of blood on the "test strip." The test strip is coated with chemicals that combine with glucose in blood. The meter measures how much glucose is present. The meter displays the glucose level as a number. Several new models can record and store a number of test results. Some models can connect to personal computers to store test results or print them out.  Newer meters are often easier to use than older models. Some meters allow you to get blood from places other than your fingertip. Some new models have automatic timing, error codes, signals, or barcode readers to help with proper adjustment (calibration). Some meters have a large display screen or spoken instructions for people with visual impairments.  INSTRUCTIONS FOR USING GLUCOSE METERS  Wash your hands with soap and warm water, or clean the area with alcohol. Dry your hands completely.   Prick the side of your fingertip with a lancet (a sharp-pointed tool used by hand).   Hold the hand down and gently milk the finger until a small drop of blood  appears. Catch the blood with the test strip.   Follow the instructions for inserting the test strip and using the SMBG meter. Most meters require the meter to be turned on and the test strip to be inserted before applying the blood sample.   Record the test result.   Read the instructions carefully for both the meter and the test strips that go with it. Meter instructions are found in the user manual. Keep this manual to help you solve any problems that may arise. Many meters use "error codes" when there is a problem with the meter, the test strip, or the blood sample on the strip. You will need the manual to understand these error codes and fix the problem.   New devices are available such as laser lancets and meters that can test blood taken from "alternative sites" of the body, other than fingertips. However, you should use standard fingertip testing if your glucose changes rapidly. Also, use standard testing if:   You have eaten, exercised, or taken insulin in the past 2 hours.  You think your glucose is low.   You tend to not feel symptoms of low blood glucose (hypoglycemia).   You are ill or under stress.   Clean the meter as directed by the manufacturer.   Test the meter for accuracy as directed by the manufacturer.   Take your meter with you to your caregiver's office. This way, you can test your glucose in front of your caregiver to make sure you are using the meter correctly. Your caregiver can also take a sample of blood to test using a routine lab method. If values on the glucose meter are close to the lab results, you and your caregiver will see that your meter is working well and you are using good technique. Your caregiver will advise you about what to do if the results do not match.  FREQUENCY OF TESTING  Your caregiver will tell you how often you should check your blood glucose. This will depend on your type of diabetes, your current level of diabetes control, and your  types of medicines. The following are general guidelines, but your care plan may be different. Record all your readings and the time of day you took them for review with your caregiver.   Diabetes type 1.   When you are using insulin with good diabetic control (either multiple daily injections or via a pump), you should check your glucose 4 times a day.   If your diabetes is not well controlled, you may need to monitor more frequently, including before meals and 2 hours after meals, at bedtime, and occasionally between 2 a.m. and 3 a.m.   You should always check your glucose before a dose of insulin or before changing the rate on your insulin pump.   Diabetes type 2.   Guidelines for SMBG in diabetes type 2 are not as well defined.   If you are on insulin, follow the guidelines above.   If you are on medicines, but not insulin, and your glucose is not well controlled, you should test at least twice daily.   If you are not on insulin, and your diabetes is controlled with medicines or diet alone, you should test at least once daily, usually before breakfast.   A weekly profile will help your caregiver advise you on your care plan. The week before your visit, check your glucose before a meal and 2 hours after a meal at least daily. You may want to test before and after a different meal each day so you and your caregiver can tell how well controlled your blood sugars are throughout the course of a 24 hour period.   Gestational diabetes (diabetes during pregnancy).   Frequent testing is often necessary. Accurate timing is important.   If you are not on insulin, check your glucose 4 times a day. Check it before breakfast and 1 hour after the start of each meal.   If you are on insulin, check your glucose 6 times a day. Check it before each meal and 1 hour after the first bite of each meal.   General guidelines.   More frequent testing is required at the start of insulin treatment. Your  caregiver will instruct you.   Test your glucose any time you suspect you have low blood sugar (hypoglycemia).   You should test more often when you change medicines, when you have unusual stress or illness, or in other unusual circumstances.  OTHER THINGS TO KNOW ABOUT GLUCOSE METERS  Measurement Range. Most glucose meters  are able to read glucose levels over a broad range of values from as low as 0 to as high as 600 mg/dL. If you get an extremely high or low reading from your meter, you should first confirm it with another reading. Report very high or very low readings to your caregiver.   Whole Blood Glucose versus Plasma Glucose. Some older home glucose meters measure glucose in your whole blood. In a lab or when using some newer home glucose meters, the glucose is measured in your plasma (one component of blood). The difference can be important. It is important for you and your caregiver to know whether your meter gives its results as "whole blood equivalent" or "plasma equivalent."   Display of High and Low Glucose Values. Part of learning how to operate a meter is understanding what the meter results mean. Know how high and low glucose concentrations are displayed on your meter.   Factors that Affect Glucose Meter Performance. The accuracy of your test results depends on many factors and varies depending on the brand and type of meter. These factors include:   Low red blood cell count (anemia).   Substances in your blood (such as uric acid, vitamin C, and others).   Environmental factors (temperature, humidity, altitude).   Name-brand versus generic test strips.   Calibration. Make sure your meter is set up properly. It is a good idea to do a calibration test with a control solution recommended by the manufacturer of your meter whenever you begin using a fresh bottle of test strips. This will help verify the accuracy of your meter.   Improperly stored, expired, or defective test  strips. Keep your strips in a dry place with the lid on.   Soiled meter.   Inadequate blood sample.  NEW TECHNOLOGIES FOR GLUCOSE TESTING Alternative site testing Some glucose meters allow testing blood from alternative sites. These include the:  Upper arm.   Forearm.   Base of the thumb.   Thigh.  Sampling blood from alternative sites may be desirable. However, it may have some limitations. Blood in the fingertips show changes in glucose levels more quickly than blood in other parts of the body. This means that alternative site test results may be different from fingertip test results, not because of the meter's ability to test accurately, but because the actual glucose concentration can be different.  Continuous Glucose Monitoring Devices to measure your blood glucose continuously are available, and others are in development. These methods can be more expensive than self-monitoring with a glucose meter. However, it is uncertain how effective and reliable these devices are. Your caregiver will advise you if this approach makes sense for you. IF BLOOD SUGARS ARE CONTROLLED, PEOPLE WITH DIABETES REMAIN HEALTHIER.  SMBG is an important part of the treatment plan of patients with diabetes mellitus. Below are reasons for using SMBG:   It confirms that your glucose is at a specific, healthy level.   It detects hypoglycemia and severe hyperglycemia.   It allows you and your caregiver to make adjustments in response to changes in lifestyle for individuals requiring medicine.   It determines the need for starting insulin therapy in temporary diabetes that happens during pregnancy (gestational diabetes).  Document Released: 02/20/2003 Document Revised: 02/06/2011 Document Reviewed: 06/13/2010 [facility] Patient Information 2012 [place], [place].Diabetes, Frequently Asked Questions WHAT IS DIABETES? Most of the food we eat is turned into glucose (sugar). Our bodies use it for energy. The pancreas  makes a hormone called insulin.  It helps glucose get into the cells of our bodies. When you have diabetes, your body either does not make enough insulin or cannot use its own insulin as well as it should. This causes sugars to build up in your blood. WHAT ARE THE SYMPTOMS OF DIABETES?  Frequent urination.   Excessive thirst.   Unexplained weight loss.   Extreme hunger.   Blurred vision.   Tingling or numbness in hands or feet.   Feeling very tired much of the time.   Dry, itchy skin.   Sores that are slow to heal.   Yeast infections.  WHAT ARE THE TYPES OF DIABETES? Type 1 Diabetes   About 10% of affected people have this type.   Usually occurs before the age of 27.   Usually occurs in thin to normal weight people.  Type 2 Diabetes  About 90% of affected people have this type.   Usually occurs after the age of 3.   Usually occurs in overweight people.   More likely to have:   A family history of diabetes.   A history of diabetes during pregnancy (gestational diabetes).   High blood pressure.   High cholesterol and triglycerides.  Gestational Diabetes  Occurs in about 4% of pregnancies.   Usually goes away after the baby is born.   More likely to occur in women with:   Family history of diabetes.   Previous gestational diabetes.   Obese.   Over 33 years old.  WHAT IS PRE-DIABETES? Pre-diabetes means your blood glucose is higher than normal, but lower than the diabetes range. It also means you are at risk of getting type 2 diabetes and heart disease. If you are told you have pre-diabetes, have your blood glucose checked again in 1 to 2 years. WHAT IS THE TREATMENT FOR DIABETES? Treatment is aimed at keeping blood glucose near normal levels at all times. Learning how to manage this yourself is important in treating diabetes. Depending on the type of diabetes you have, your treatment will include one or more of the following:  Monitoring your blood  glucose.   Meal planning.   Exercise.   Oral medicine (pills) or insulin.  CAN DIABETES BE PREVENTED? With type 1 diabetes, prevention is more difficult, because the triggers that cause it are not yet known. With type 2 diabetes, prevention is more likely, with lifestyle changes:  Maintain a healthy weight.   Eat healthy.   Exercise.  IS THERE A CURE FOR DIABETES? No, there is no cure for diabetes. There is a lot of research going on that is looking for a cure, and progress is being made. Diabetes can be treated and controlled. People with diabetes can manage their diabetes and lead normal, active lives. SHOULD I BE TESTED FOR DIABETES? If you are at least 54 years old, you should be tested for diabetes. You should be tested again every 3 years. If you are 45 or older and overweight, you may want to get tested more often. If you are younger than 45, overweight, and have one or more of the following risk factors, you should be tested:  Family history of diabetes.   Inactive lifestyle.   High blood pressure.  WHAT ARE SOME OTHER SOURCES FOR INFORMATION ON DIABETES? The following organizations may help in your search for more information on diabetes: National Diabetes Education Program (NDEP) Internet: SolarDiscuss[employer] American Diabetes Association Internet: http://www.diabetes.org  Juvenile Diabetes Foundation International Internet: [employer] Document Released: 02/20/2003 Document Revised: 02/06/2011 Document Reviewed:  12/15/2008 [employer] Patient Information 2012 [place], [place].Diabetes, Keeping Your Heart and Blood Vessels Healthy Too much glucose (sugar) in the blood for a long period of time can cause problems for those who have diabetes. This high blood glucose can damage many parts of the body, such as the heart, blood vessels, eyes, and kidneys. Heart and blood vessel disease can lead to heart attacks and strokes, which is the leading cause of death  for people with diabetes. There are many things you can to do slow down or prevent the complications from diabetes. WHAT DO MY HEART AND BLOOD VESSELS DO? Your heart and blood vessels make up your circulatory system. Your heart is a big muscle that pumps blood through your body. Your heart pumps blood carrying oxygen to large blood vessels (arteries) and small blood vessels (capillaries). Other blood vessels, called veins, carry blood back to the heart. HOW DO MY BLOOD VESSELS GET CLOGGED? Several things, including having diabetes, can make your blood cholesterol level too high. Cholesterol is a substance that is made by the body and used for many important functions. It is also found in some food that comes from animals. When cholesterol is too high, the insides of large blood vessels become narrowed, even clogged. Narrowed and clogged blood vessels make it harder for enough blood to get to all parts of your body. This problem is called atherosclerosis. WHAT CAN HAPPEN WHEN BLOOD VESSELS ARE CLOGGED? When arteries become narrowed and clogged, you may have heart problems and are at increased risk for heart attack and stroke:  Chest pain (angina) causes pain in your chest, arms, shoulders, or back. You may feel the pain more when your heart beats faster, such as when you exercise. The pain may go away when you rest. You also may feel very weak and sweaty. If you do not get treatment, chest pain may happen more often. If diabetes has damaged the heart nerves, you may not feel the chest pain.   Heart attack. A heart attack happens when a blood vessel in or near the heart becomes blocked. Not enough blood can get to that part of the heart muscle so the area becomes oxygen deprived and the heart muscle may be permanently damaged.  WHAT ARE THE WARNING SIGNS OF A HEART ATTACK? You may have one or more of the following warning signs:  Chest pain or discomfort.   Pain or discomfort in your arms, back, jaw,  or neck.   Indigestion or stomach pain.   Shortness of breath.   Sweating.   Nausea or vomiting.   Light-headedness.   You may have no warning signs at all, or they may come and go.  HOW DOES HEART DISEASE CAUSE HIGH BLOOD PRESSURE? Narrowed blood vessels leave a smaller opening for blood to flow through. It is like turning on a garden hose and holding your thumb over the opening. The smaller opening makes the water shoot out with more pressure. In the same way, narrowed blood vessels lead to high blood pressure. Other factors, such as kidney problems and being overweight, can also lead to high blood pressure. Many people with diabetes also have high blood pressure. If you have heart, eye, or kidney problems from diabetes, high blood pressure can make them worse. If you have high blood pressure, ask your caregiver how to lower it. Your caregiver may be asked to take blood pressure medicine every day. Some types of blood pressure medicine can also help keep your kidneys healthy. To  lower your blood pressure, your may be asked to lose weight; eat more fruits and vegetables; eat less salt and high-sodium foods, such as canned soups, luncheon meats, salty snack foods, and fast foods; and drink less alcohol. WHAT ARE THE WARNING SIGNS OF A STROKE? A stroke happens when part of your brain is not getting enough blood and stops working. Depending on the part of the brain that is damaged, a stroke can cause:  Sudden weakness or numbness of your face, arm, or leg on one side of your body.   Sudden confusion, trouble talking, or trouble understanding.   Sudden dizziness, loss of balance, or trouble walking.   Sudden trouble seeing in one or both eyes or sudden double vision.   Sudden severe headache.  Sometimes, one or more of these warning signs may happen and then disappear. You might be having a "mini-stroke," also called a TIA (transient ischemic attack). If you have any of these warning  signs, tell your caregiver right away. HOW CAN CLOGGED BLOOD VESSELS HURT MY LEGS AND FEET? Peripheral vascular disease can happen when the openings in your blood vessels become narrow and not enough blood gets to your legs and feet. You may feel pain in your buttocks, the back of your legs, or your thighs when you stand, walk, or exercise. Sometimes, surgery is necessary to treat this problem. WHAT CAN I DO TO KEEP MY BLOOD VESSELS HEALTHY AND PREVENT HEART DISEASE AND STROKE?  Keep your blood glucose under control. An A1c blood test will probably be ordered by your caregiver at least twice a year. The A1c test tells you your average blood glucose for the past 2 to 3 months and can give valuable information on the overall control of your diabetes.   Keep your blood pressure under control. Have it checked at every health care visit. If you are on medication to control blood pressure, take it exactly as prescribed. The target for most people is below 130/80.   Keep your cholesterol under control. Have it checked at least once a year. The targets for most people are:   LDL (bad) cholesterol: below 100 mg/dl.   HDL (good) cholesterol: above 40 mg/dl in men and above 50 mg/dl in women.   Triglycerides (another type of fat in the blood): below 150 mg/dl.   Make physical activity a part of your daily routine. Aim for at least 30 minutes of exercise most days of the week. Check with your caregiver to learn what activities are best for you.   Make sure that the foods you eat are "heart-healthy." Include foods high in fiber, such as oat bran, oatmeal, whole-grain breads and cereals, fruits, and vegetables. Cut back on foods high in saturated fat or cholesterol, such as meats, butter, dairy products with fat, eggs, shortening, lard, and foods with palm oil or coconut oil.   Maintain a healthy weight. If you are overweight, try to exercise most days of the week. See a registered dietitian for help in  planning meals and lowering the fat and calorie content.   If you smoke, QUIT. Your caregiver can tell you about ways to help you quit smoking.   Ask your caregiver whether you should take an aspirin every day. Studies have shown that taking a low dose of aspirin every day can help reduce your risk of heart disease and stroke.   Take your medicines as directed.  SEEK MEDICAL CARE IF:   You have any of the warning signs  of a heart attack or stroke.   If you have pain in your legs or feet when walking.   If your feet and legs are cool or cold to the touch.  FOR MORE INFORMATION   Diabetes educators (nurses, dietitians, pharmacists, and other health professionals).   To find a diabetes educator near you, call the American Association of Diabetes Educators (AADE) toll-free at 1-800-TEAMUP4 (503) 862-5890), or look on the Internet at www.diabeteseducator.org and click on "Find a Diabetes Educator."   Dietitians.   To find a dietitian near you, call the American Dietetic Association toll-free at [phone], or look on the Internet at www.eatright.org and click on "Find a Nutrition Professional."   Government.   The National Heart, Lung, and Blood Institute (NHLBI) is part of the [employer]. To learn more about heart and blood vessel problems, write or call NHLBI Information Center, [id number], [place] [address], [phone]; or see PopSteam.is on the Internet.   To get more information about taking care of diabetes, contact:  National Diabetes Information Clearinghouse [address] [place], [place] [address] Phone: [phone] or [phone] Fax: [phone] Email: ndic@info .StageSync.si Internet: www.diabetes.StageSync.si National Diabetes Education Program [address] [place], [place] [address] Phone: [phone] Fax: [phone] Internet: CommunicationFinder.com.au American Diabetes Association [address] [place], [place] [address] Phone: [phone] Internet: www.diabetes.org Juvenile Diabetes [employer] [facility] [address] floor [place], [place] [address] Phone: [phone] Internet: www.jdrf.org Document Released: 02/20/2003 Document Revised: 02/06/2011 Document Reviewed: 07/28/2008 [facility] Patient Information 2012 [place], [place].Dizziness Dizziness is a common problem. It is a feeling of unsteadiness or lightheadedness. You may feel like you are about to faint. Dizziness can lead to injury if you stumble or fall. A person of any age group can suffer from dizziness, but dizziness is more common in older adults. CAUSES  Dizziness can be caused by many different things, including:  Middle ear problems.   Standing for too long.   Infections.   An allergic reaction.   Aging.   An emotional response to something, such as the sight of blood.   Side effects of medicines.   Fatigue.   Problems with circulation or blood pressure.   Excess use of alcohol, medicines, or illegal drug use.   Breathing too fast (hyperventilation).   An arrhythmia or problems with your heart rhythm.   Low red blood cell count (anemia).   Pregnancy.   Vomiting, diarrhea, fever, or other illnesses that cause dehydration.   Diseases or conditions such as Parkinson's disease, high blood pressure (hypertension), diabetes, and thyroid problems.   Exposure to extreme heat.  DIAGNOSIS  To find the cause of your dizziness, your caregiver may do a physical exam, lab tests, radiologic imaging scans, or an electrocardiography test (ECG).  TREATMENT  Treatment of dizziness depends on the cause of your symptoms and can vary greatly. HOME CARE INSTRUCTIONS   Drink enough fluids to keep your urine clear or pale yellow. This is especially important in very hot weather. In the elderly, it is also important in cold weather.   If your dizziness is caused by medicines, take them exactly as directed.  When taking blood pressure medicines, it is especially important to get up slowly.   Rise slowly from chairs and steady yourself until you feel okay.   In the morning, first sit up on the side of the bed. When this seems okay, stand slowly while holding onto something until you know your balance is fine.  If you need to stand in one place for a long time, be sure to move your legs often. Tighten and relax the muscles in your legs while standing.   If dizziness continues to be a problem, have someone stay with you for a day or two. Do this until you feel you are well enough to stay alone. Have the person call your caregiver if he or she notices changes in you that are concerning.   Do not drive or use heavy machinery if you feel dizzy.  SEEK IMMEDIATE MEDICAL CARE IF:   Your dizziness or lightheadedness gets worse.   You feel nauseous or vomit.   You develop problems with talking, walking, weakness, or using your arms, hands, or legs.   You are not thinking clearly or you have difficulty forming sentences. It may take a friend or family member to determine if your thinking is normal.   You develop chest pain, abdominal pain, shortness of breath, or sweating.   Your vision changes.   You notice any bleeding.   You have side effects from medicine that seems to be getting worse rather than better.  MAKE SURE YOU:   Understand these instructions.   Will watch your condition.   Will get help right away if you are not doing well or get worse.  Document Released: 08/13/2000 Document Revised: 02/06/2011 Document Reviewed: 09/06/2010 [facility] Patient Information 2012 [place], [place].Diabetes, Type 2 Diabetes is a long-lasting (chronic) disease. In type 2 diabetes, the pancreas does not make enough insulin (a hormone), and the body does not respond normally to the insulin that is made. This type of diabetes was also previously called adult-onset diabetes. It usually occurs after the age of  76, but it can occur at any age.  CAUSES  Type 2 diabetes happens because the pancreasis not making enough insulin or your body has trouble using the insulin that your pancreas does make properly. SYMPTOMS   Drinking more than usual.   Urinating more than usual.   Blurred vision.   Dry, itchy skin.   Frequent infections.   Feeling more tired than usual (fatigue).  DIAGNOSIS The diagnosis of type 2 diabetes is usually made by one of the following tests:  Fasting blood glucose test. You will not eat for at least 8 hours and then take a blood test.   Random blood glucose test. Your blood glucose (sugar) is checked at any time of the day regardless of when you ate.   Oral glucose tolerance test (OGTT). Your blood glucose is measured after you have not eaten (fasted) and then after you drink a glucose containing beverage.  TREATMENT   Healthy eating.   Exercise.   Medicine, if needed.   Monitoring blood glucose.   Seeing your caregiver regularly.  HOME CARE INSTRUCTIONS   Check your blood glucose at least once a day. More frequent monitoring may be necessary, depending on your medicines and on how well your diabetes is controlled. Your caregiver will advise you.   Take your medicine as directed by your caregiver.   Do not smoke.   Make wise food choices. Ask your caregiver for information. Weight loss can improve your diabetes.   Learn about low blood glucose (hypoglycemia) and how to treat it.   Get your eyes checked regularly.   Have a yearly physical exam. Have your blood pressure checked and your blood and urine tested.   Wear a pendant or bracelet saying that you have diabetes.   Check  your feet every night for cuts, sores, blisters, and redness. Let your caregiver know if you have any problems.  SEEK MEDICAL CARE IF:   You have problems keeping your blood glucose in target range.   You have problems with your medicines.   You have symptoms of an illness  that do not improve after 24 hours.   You have a sore or wound that is not healing.   You notice a change in vision or a new problem with your vision.   You have a fever.  MAKE SURE YOU:  Understand these instructions.   Will watch your condition.   Will get help right away if you are not doing well or get worse.  Document Released: 02/17/2005 Document Revised: 02/06/2011 Document Reviewed: 08/05/2010 [facility] Patient Information 2012 [place], [place].High Blood Sugar High blood sugar (hyperglycemia) means that the level of sugar in your blood is higher than it should be. Signs of high blood sugar include:  Feeling thirsty.   Frequent peeing (urinating).   Feeling tired or sleepy.   Dry mouth.   Vision changes.   Feeling weak.   Feeling hungry but losing weight.   Numbness and tingling in your hands or feet.   Headache.  When you ignore these signs, your blood sugar may keep going up. These problems may get worse, and other problems may begin. HOME CARE  Check your blood sugars as told by your doctor. Write down the numbers with the date and time.   Take the right amount of insulin or diabetes pills at the right time. Write down the dose with date and time.   Refill your insulin or diabetes pills before running out.   Watch what you eat. Follow your meal plan.   Drink liquids without sugar, such as water. Check with your doctor if you have kidney or heart disease.   Follow your doctor's orders for exercise. Exercise at the same time of day.   Keep your doctor's appointments.  GET HELP RIGHT AWAY IF:   You have trouble thinking or are confused.   You have fast breathing with fruity smelling breath.   You pass out (faint).   You have 2 to 3 days of high blood sugars and you do not know why.   You have chest pain.   You are feeling sick to your stomach (nauseous) or throwing up (vomiting).   You have sudden vision changes.  MAKE SURE YOU:   Understand  these instructions.   Will watch your condition.   Will get help right away if you are not doing well or get worse.  Document Released: 12/15/2008 Document Revised: 02/06/2011 Document Reviewed: 12/15/2008 [facility] Patient Information 2012 [place], [place].  RESOURCE GUIDE  Dental Problems  Patients with Medicaid: [facility] [id number]                                           [phone] [address] [employer] [address] Phone:  [phone]  Phone:  [phone]  If unable to pay or uninsured, contact:  [facility] or [facility]. to become qualified for the adult dental clinic.  Chronic Pain Problems Contact [facility]  [phone] Patients need to be referred by their primary care doctor.  Insufficient Money for Medicine Contact United Way:  call "211" or [facility] [phone].  No Primary Care Doctor Call [facility]  [phone] Other agencies that provide inexpensive medical care    [facility]  [phone]    [facility]  [phone]    [facility]  [phone]    [facility]  [phone]    [facility]  [phone]    [facility]  [phone]  Psychological Services [facility]  [phone] [facility]  [phone] [facility]   [phone] (emergency services [phone])  Substance Abuse Resources [facility]  [phone] [facility] [phone] [facility] [place] [phone] Floydene Flock [phone] Residential & Outpatient Substance Abuse Program  6131547230  Abuse/Neglect [facility] Child Abuse Hotline (934)280-7098 [facility] Child Abuse Hotline (336)408-7772 (After Hours)  Emergency Shelter [facility] [phone]  Maternity Homes [facility] [place] [facility] [phone] [facility]  Services [phone]  MRSA Hotline #:   (757) 160-1247    [facility] Resources  [facility]     United Way                          [facility]. [address] [place]                       [address]      [address]  Blondell Reveal Phone:  [phone]                                   Phone:  [phone]                 Phone:  [phone]  [facility] Mental [facility] Phone:  [phone]  [facility] Child Abuse Hotline [phone] [phone] (After Hours)

## 2011-06-19 NOTE — Telephone Encounter (Signed)
Ms Stills called today to let us know that she is in the ER with a blood sugar of 528.

## 2011-06-19 NOTE — ED Notes (Signed)
EMS: BP: 96/64 P: 76 R: 16

## 2011-06-19 NOTE — ED Notes (Signed)
Patient undressed and in a gown. Cardiac monitor, pulse oximetry, and blood pressure cuff on. 

## 2011-06-19 NOTE — ED Notes (Signed)
Per EMS:  Pt c/o dizziness for the past three weeks since change in medication to Carvedilol dosage.  Pt denies any thirst but states she does have polydipsia but she thinks that is from her lasix. CBG: 527 @ [facility]

## 2011-06-19 NOTE — Telephone Encounter (Signed)
Tonye Becket, NP

## 2011-06-19 NOTE — ED Provider Notes (Signed)
History    56yF with dizziness. dsecribes as sensation that may pass out. Intermittent over past 3w. No appreciable exacerbating or relieving factors. No cp ro sob. No fever or chills. No n/v. Polyuria, otherwise no urinary complaints. Pt says coreg dose recently changes because of symptoms. Pt with known cad. Reports compliance with medications.  CSN: [id number]  Arrival date & time 06/19/11  1011   First MD Initiated Contact with Patient 06/19/11 1016      Chief Complaint  Patient presents with  . Dizziness  . Hyperglycemia    (Consider location/radiation/quality/duration/timing/severity/associated sxs/prior treatment) HPI  Past Medical History  Diagnosis Date  . Coronary artery disease   . Diabetes mellitus   . Hypertension     Past Surgical History  Procedure Date  . Coronary artery bypass graft   . Cardiac catheterization     Family History  Problem Relation Age of Onset  . Heart attack Mother     Died age 89  . Diabetes Maternal Grandmother   . Heart attack Cousin   . Heart attack Maternal Uncle     History  Substance Use Topics  . Smoking status: Former Smoker    Quit date: 03/03/2011  . Smokeless tobacco: Not on file  . Alcohol Use: Yes     Had some drinks New Years, describes social drinkingC    OB History    Grav Para Term Preterm Abortions TAB SAB Ect Mult Living                  Review of Systems  , Review of symptoms negative unless otherwise noted in HPI.   Allergies  Review of patient's allergies indicates no known allergies.  Home Medications   Current Outpatient Rx  Name Route Sig Dispense Refill  . ALLOPURINOL 100 MG PO TABS Oral Take 200 mg by mouth daily.    . ASPIRIN 81 MG PO CHEW Oral Chew 81 mg by mouth daily.    [name] CARVEDILOL 6.25 MG PO TABS Oral Take 3.125-6.25 mg by mouth 2 (two) times daily with a meal. Take 1/2 tab in AM and 1 tab in PM    . DIGOXIN 0.125 MG PO TABS Oral Take 0.125 mg by mouth daily.    . FUROSEMIDE 40  MG PO TABS Oral Take 40 mg by mouth 2 (two) times daily.    [name] LISINOPRIL 2.5 MG PO TABS Oral Take 5 mg by mouth 2 (two) times daily.    [name] NITROGLYCERIN 0.4 MG SL SUBL Sublingual Place 0.4 mg under the tongue every 5 (five) minutes as needed. For chest pain.    [name] SPIRONOLACTONE 25 MG PO TABS Oral Take 25 mg by mouth daily.    . TRAMADOL HCL 50 MG PO TABS Oral Take 50 mg by mouth every 6 (six) hours as needed. pain      BP 102/57  Pulse 76  Temp(Src) 98.2 F (36.8 C) (Oral)  Resp 16  SpO2 98%  Physical Exam  Nursing note and vitals reviewed. Constitutional: She is oriented to person, place, and time. She appears well-developed and well-nourished. No distress.       Sitting up in bed. nad.  HENT:  Head: Normocephalic and atraumatic.  Right Ear: External ear normal.  Eyes: Conjunctivae are normal. Pupils are equal, round, and reactive to light. Right eye exhibits no discharge. Left eye exhibits no discharge.  Neck: Neck supple.  Cardiovascular: Normal rate, regular rhythm and normal heart sounds.  Exam reveals no  gallop and no friction rub.   No murmur heard. Pulmonary/Chest: Effort normal and breath sounds normal. No respiratory distress.  Abdominal: Soft. She exhibits no distension. There is no tenderness.  Musculoskeletal: Normal range of motion. She exhibits no edema and no tenderness.       Lower extremities symmetric as compared to each other. No calf tenderness. Negative Homan's. No palpable cords.   Neurological: She is alert and oriented to person, place, and time. No cranial nerve deficit. She exhibits normal muscle tone. Coordination normal.  Skin: Skin is warm and dry. She is not diaphoretic.  Psychiatric: She has a normal mood and affect. Her behavior is normal. Thought content normal.    ED Course  Procedures (including critical care time)  Labs Reviewed  GLUCOSE, CAPILLARY - Abnormal; Notable for the following:    Glucose-Capillary 527 (*)    All other components  within normal limits  BASIC METABOLIC PANEL - Abnormal; Notable for the following:    Sodium 122 (*)    Potassium 5.2 (*)    Chloride 84 (*)    Glucose, Bld 532 (*)    BUN 44 (*)    Creatinine, Ser 1.64 (*)    GFR calc non Af Amer 35 (*)    GFR calc Af Amer 40 (*)    All other components within normal limits  URINALYSIS, ROUTINE W REFLEX MICROSCOPIC - Abnormal; Notable for the following:    APPearance CLOUDY (*)    Glucose, UA >1000 (*)    Protein, ur 30 (*)    Leukocytes, UA TRACE (*)    All other components within normal limits  URINE MICROSCOPIC-ADD ON - Abnormal; Notable for the following:    Squamous Epithelial / LPF FEW (*)    Bacteria, UA FEW (*)    All other components within normal limits  CBC  TROPONIN I   No results found.  EKG:  Rhythm: nsr Rate: 83 Axis: Left Intervals: LBBB ST segments: NS ST changes   1. Uncontrolled diabetes mellitus   2. Chronic renal insufficiency   3. Dizziness       MDM  55yf with new onset diabetes. No evidence of DKA. Pt started on metformin. Discussed importance of close outpt fu. Renal insufficiency which appears to be baseline. Dizziness possibly related to hyperglycemia. EKG nonprovocative. Mild hypotension. Pt instructed to stop lisinopril given mild hypotension, symptoms and renal insufficiency. Understands that needs to discussed further with prescribing provider. Doubt infectious. Pt afebrile and well appearing. Return precautions dicsussed.        Raeford Razor, MD 06/23/11 [phone]

## 2011-06-19 NOTE — ED Notes (Signed)
CBG 527 

## 2011-06-26 ENCOUNTER — Ambulatory Visit (HOSPITAL_COMMUNITY)
Admission: RE | Admit: 2011-06-26 | Discharge: 2011-06-26 | Disposition: A | Discharge status: Home / Self Care | Payer: Medicaid Other | Source: Ambulatory Visit | Attending: Family Medicine | Admitting: Family Medicine

## 2011-06-26 DIAGNOSIS — Z1231 Encounter for screening mammogram for malignant neoplasm of breast: Secondary | ICD-10-CM | POA: Insufficient documentation

## 2011-07-21 ENCOUNTER — Ambulatory Visit (HOSPITAL_COMMUNITY)
Admission: RE | Admit: 2011-07-21 | Discharge: 2011-07-21 | Disposition: A | Discharge status: Home / Self Care | Payer: Medicaid Other | Source: Ambulatory Visit | Attending: Internal Medicine | Admitting: Internal Medicine

## 2011-07-21 ENCOUNTER — Encounter (HOSPITAL_COMMUNITY): Payer: Self-pay

## 2011-07-21 VITALS — BP 110/64 | HR 77 | Ht 64.0 in | Wt 171.4 lb

## 2011-07-21 DIAGNOSIS — I5022 Chronic systolic (congestive) heart failure: Secondary | ICD-10-CM | POA: Insufficient documentation

## 2011-07-21 HISTORY — DX: Other psychoactive substance abuse, uncomplicated: F19.10

## 2011-07-21 NOTE — Patient Instructions (Signed)
Will schedule you to follow up with electrophysiologist at [facility] for your ICD.   Will try to increase carvedilol 6.25 mg at night but continue 3.125 mg in the morning.    Follow up 4-6 weeks.    Do the following things EVERYDAY: 1) Weigh yourself in the morning before breakfast. Write it down and keep it in a log. 2) Take your medicines as prescribed 3) Eat low salt foods--Limit salt (sodium) to 2000 mg per day.  4) Stay as active as you can everyday 5) Limit all fluids for the day to less than 2 liters

## 2011-07-21 NOTE — Progress Notes (Signed)
PCP:  [facility]   HPI:  56 year old female with history of HTN, DM2, polysubstance abuse (ETOH, tobacco, cocaine) and PAD. 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 At time of cath LAD, LCX and RCA occluded. EF 45%. Had abrupt stent occlusion the next day and had to go back to the lab. Had PCI of LAD and then underwent CABG x 5 with mitral valve annuloplasty.  ECHO 2011 EF 20-25% -> Medtronic ICD placed.   [facility] 03/17/11  RA = 8  RV = 47/1/9  PA = 52/12 (28)  PCW = 20 no significant v-waves  Fick cardiac output/index = 4.2/2.2  PVR =1.6 [place]  FA sat = 95%  PA sat = 61%, 63%  Ao Pressure: 89/58 (71)  LV Pressure: 86/12/24  Severe native CAD with all bypass grafts patent   Echo 03/2011: Severe LV dysfunction EF 10-15%   04/10/11: Stress Test (on Carvedilol)  Peak VO2: 13.6 % predicted peak VO2: 63.3% VE/VCO2 slope: 44.9 OUES: 1.11Peak RER: 1.12 Slope 45%  05/21/11 Lower extremity doppler negative for DVT  She is here for routine follow up.  She is doing well.  She was having issues with dizziness and coreg cut back and feels better.  During this time her glucose was also 500s.  This is followed by PCP, will see again in 2 days.  She denies SOB/orthopnea/PND/CP. Weight stable at home.  No extra fluid pills needed.  No chest pain.    ROS: All systems negative except as listed in HPI, PMH and Problem List.  Past Medical History  Diagnosis Date  . Coronary artery disease   . Diabetes mellitus   . Hypertension   . CHF (congestive heart failure)     Systolic.  EF 10-15%  . Polysubstance abuse     history of  (cocaine, tobacco and ETOH)    Current Outpatient Prescriptions  Medication Sig Dispense Refill  . allopurinol (ZYLOPRIM) 100 MG tablet Take 200 mg by mouth daily.      [name] aspirin 81 MG chewable tablet Chew 81 mg by mouth daily.      . carvedilol (COREG) 6.25 MG tablet Take 3.125 mg by mouth 2 (two) times daily with a meal.         . digoxin (LANOXIN) 0.125 MG tablet Take 0.125 mg by mouth daily.      . furosemide (LASIX) 40 MG tablet Take 40 mg by mouth 2 (two) times daily.      [name] glipiZIDE (GLUCOTROL) 10 MG tablet Take 10 mg by mouth 2 (two) times daily before a meal.      . lisinopril (PRINIVIL,ZESTRIL) 2.5 MG tablet Take 5 mg by mouth 2 (two) times daily.      . pravastatin (PRAVACHOL) 40 MG tablet Take 40 mg by mouth at bedtime.      [name] spironolactone (ALDACTONE) 25 MG tablet Take 25 mg by mouth daily.      . traMADol (ULTRAM) 50 MG tablet Take 50 mg by mouth every 6 (six) hours as needed. pain      . nitroGLYCERIN (NITROSTAT) 0.4 MG SL tablet Place 0.4 mg under the tongue every 5 (five) minutes as needed. For chest pain.         PHYSICAL EXAM: Filed Vitals:   07/21/11 1104  BP: 110/64  Pulse: 77  Height: 5\' 4"  (1.626 m)  Weight: 171 lb 6.4 oz (77.747 kg)  SpO2: 98%   General:  Well appearing. No resp difficulty HEENT: normal Neck: supple. JVP 5-6 Carotids 2+ bilaterally; no bruits. No lymphadenopathy or thryomegaly appreciated. Cor: PMI normal. Regular rate & rhythm. No rubs, gallops or murmurs. Lungs: clear Abdomen: soft, nontender, nondistended. No hepatosplenomegaly. No bruits or masses. Good bowel sounds. Extremities: no cyanosis, clubbing, rash, edema  Neuro: alert & orientedx3, cranial nerves grossly intact. Moves all 4 extremities w/o difficulty. Affect pleasant.    ASSESSMENT & PLAN:

## 2011-07-21 NOTE — Assessment & Plan Note (Signed)
No ischemic symptoms, continue current medications.   

## 2011-07-21 NOTE — Assessment & Plan Note (Addendum)
Volume status stable.  NYHA II-III.  Unsure if dizziness was related to hypotension or uncontrolled glucose, would favor increasing carvedilol 6.25 mg at night but continue 3.125 mg in the morning.  If she experiences dizziness she is to call the office and cut back to 3.125 mg BID.  Will follow closely for med titration.  Will check repeat echo in July (6 months after last echo).  She has not followed up with EP for her ICD, will have this scheduled.   Patient seen and examined with Ulyess Blossom, PA-C. We discussed all aspects of the encounter. I agree with the assessment and plan as stated above. She appears much more stable. Volume status and exercise capacity improved. She in NYHA III. No more S3 on exam. Will continue to titrate meds. Reminded her to be vigilant about taking meds and dietary compliance. Repeat echo at next visit to check for some LV recovery. Discussed with her and her sister.

## 2011-07-25 ENCOUNTER — Ambulatory Visit (HOSPITAL_COMMUNITY): Admission: RE | Admit: 2011-07-25 | Payer: Medicaid Other | Source: Ambulatory Visit | Admitting: Gastroenterology

## 2011-07-25 ENCOUNTER — Encounter (HOSPITAL_COMMUNITY): Admission: RE | Payer: Self-pay | Source: Ambulatory Visit

## 2011-07-25 SURGERY — COLONOSCOPY
Anesthesia: Moderate Sedation

## 2011-08-14 ENCOUNTER — Encounter: Payer: Self-pay | Admitting: Internal Medicine

## 2011-08-14 ENCOUNTER — Ambulatory Visit (INDEPENDENT_AMBULATORY_CARE_PROVIDER_SITE_OTHER): Payer: Medicaid Other | Admitting: Internal Medicine

## 2011-08-14 ENCOUNTER — Ambulatory Visit: Payer: Medicaid Other | Admitting: Internal Medicine

## 2011-08-14 VITALS — BP 112/52 | HR 71 | Ht 64.0 in | Wt 171.0 lb

## 2011-08-14 DIAGNOSIS — I255 Ischemic cardiomyopathy: Secondary | ICD-10-CM | POA: Insufficient documentation

## 2011-08-14 DIAGNOSIS — I2589 Other forms of chronic ischemic heart disease: Secondary | ICD-10-CM

## 2011-08-14 DIAGNOSIS — I5022 Chronic systolic (congestive) heart failure: Secondary | ICD-10-CM

## 2011-08-14 DIAGNOSIS — I4891 Unspecified atrial fibrillation: Secondary | ICD-10-CM

## 2011-08-14 DIAGNOSIS — Z95810 Presence of automatic (implantable) cardiac defibrillator: Secondary | ICD-10-CM

## 2011-08-14 LAB — ICD DEVICE OBSERVATION
BATTERY VOLTAGE: 3.179 V
CHARGE TIME: 9.379 s
DEV-0020ICD: NEGATIVE
PACEART VT: 0
RV LEAD AMPLITUDE: 20.75 mv
TOT-0002: 0
TOT-0006: 20111012000000
TZAT-0018SLOWVT: NEGATIVE
TZAT-0019FASTVT: 8 V
TZAT-0019SLOWVT: 8 V
TZAT-0020FASTVT: 1.5 ms
TZAT-0020SLOWVT: 1.5 ms
TZON-0003SLOWVT: 360 ms
TZON-0003VSLOWVT: 370 ms
TZON-0004SLOWVT: 16
TZON-0004VSLOWVT: 20
TZON-0005SLOWVT: 12
TZST-0001FASTVT: 3
TZST-0001SLOWVT: 2
TZST-0001SLOWVT: 6
TZST-0002FASTVT: NEGATIVE
TZST-0002FASTVT: NEGATIVE
TZST-0002FASTVT: NEGATIVE
TZST-0002SLOWVT: NEGATIVE
TZST-0002SLOWVT: NEGATIVE
TZST-0002SLOWVT: NEGATIVE

## 2011-08-14 NOTE — Assessment & Plan Note (Signed)
Stable on current medications 

## 2011-08-14 NOTE — Assessment & Plan Note (Signed)
The patient's device was interrogated.  The information was reviewed. No changes were made in the programming.    

## 2011-08-14 NOTE — Assessment & Plan Note (Signed)
The patient has class III stable heart failure. The patient is on Guideline directed medical therapy She has left bundle branch block with a QRS duration of just under 130 ms and as such could be considered for CRT upgrade. I will defer this to the heart failure program and will wait to hear back from them. I have broached this topics with the patient.

## 2011-08-14 NOTE — Progress Notes (Signed)
  HPI  [name] is a 56 y.o. 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  Catheterization 2013-January demonstrated patent grafts cardiac index 2.2 ejection fraction 10-15%  She has been doing much better since she has been followed by Dr. Dorthea Cove over the last 6 months. She had been followed by [facility] but her care there was terminated because of financial issues.  She currently has class III heart failure. She is able to, with difficulty, maneuver one flight of stairs. He does not have nocturnal dyspnea or orthopnea or edema. He's had no ICD discharges.  SHe says that she has not been on any substance abuse since January 1  Past Medical History  Diagnosis Date  . Coronary artery disease   . Diabetes mellitus   . Hypertension   . CHF (congestive heart failure)     Systolic.  EF 10-15%  . Polysubstance abuse     history of  (cocaine, tobacco and ETOH)    Past Surgical History  Procedure Date  . Coronary artery bypass graft   . Cardiac catheterization     Current Outpatient Prescriptions  Medication Sig Dispense Refill  . allopurinol (ZYLOPRIM) 100 MG tablet Take 200 mg by mouth daily.      [name] aspirin 81 MG chewable tablet Chew 81 mg by mouth daily.      . carvedilol (COREG) 6.25 MG tablet Take 3.125 mg by mouth 2 (two) times daily with a meal.       . digoxin (LANOXIN) 0.125 MG tablet Take 0.125 mg by mouth daily.      . furosemide (LASIX) 40 MG tablet Take 40 mg by mouth 2 (two) times daily.      [name] glipiZIDE (GLUCOTROL) 10 MG tablet Take 10 mg by mouth 2 (two) times daily before a meal.      . lisinopril (PRINIVIL,ZESTRIL) 2.5 MG tablet Take 5 mg by mouth 2 (two) times daily.      . nitroGLYCERIN (NITROSTAT) 0.4 MG SL tablet Place 0.4 mg under the tongue every 5 (five) minutes as needed. For chest pain.      . pravastatin (PRAVACHOL) 40 MG tablet  Take 40 mg by mouth at bedtime.      [name] spironolactone (ALDACTONE) 25 MG tablet Take 25 mg by mouth daily.      . traMADol (ULTRAM) 50 MG tablet Take 50 mg by mouth every 6 (six) hours as needed. pain        No Known Allergies  Review of Systems negative except from HPI and PMH  Physical Exam BP 112/52  Pulse 71  Ht 5\' 4"  (1.626 m)  Wt 171 lb (77.565 kg)  BMI 29.35 kg/m2  SpO2 96% Well developed and well nourished in no acute distress HENT normal E scleral and icterus clear Neck Supple JVP flat; carotids brisk and full Clear to ausculation Regular rate and rhythm, no murmurs gallops or rub Soft with active bowel sounds No clubbing cyanosis none Edema Alert and oriented, grossly normal motor and sensory function Skin Warm and Dry  Electrocardiogram dated April demonstrated sinus rhythm with left bundle branch block and a QRS duration of 128 ms  Assessment and  Plan

## 2011-08-14 NOTE — Assessment & Plan Note (Signed)
She has a single-chamber device. Do not see documentation of atrial fibrillation. I have reviewed all electrocardiograms in in [facility]

## 2011-08-14 NOTE — Patient Instructions (Addendum)

## 2011-08-25 ENCOUNTER — Ambulatory Visit (HOSPITAL_COMMUNITY)
Admission: RE | Admit: 2011-08-25 | Discharge: 2011-08-25 | Disposition: A | Discharge status: Home / Self Care | Payer: Medicaid Other | Source: Ambulatory Visit | Attending: Internal Medicine | Admitting: Internal Medicine

## 2011-08-25 ENCOUNTER — Encounter (HOSPITAL_COMMUNITY): Payer: Self-pay

## 2011-08-25 VITALS — BP 82/54 | HR 64 | Resp 18 | Ht 64.0 in | Wt 171.8 lb

## 2011-08-25 DIAGNOSIS — I2510 Atherosclerotic heart disease of native coronary artery without angina pectoris: Secondary | ICD-10-CM | POA: Insufficient documentation

## 2011-08-25 DIAGNOSIS — I5022 Chronic systolic (congestive) heart failure: Secondary | ICD-10-CM

## 2011-08-25 DIAGNOSIS — I509 Heart failure, unspecified: Secondary | ICD-10-CM | POA: Insufficient documentation

## 2011-08-25 NOTE — Patient Instructions (Addendum)
Follow up in 1 month   Do the following things EVERYDAY: 1) Weigh yourself in the morning before breakfast. Write it down and keep it in a log. 2) Take your medicines as prescribed 3) Eat low salt foods-Limit salt (sodium) to 2000 mg per day.  4) Stay as active as you can everyday 5) Limit all fluids for the day to less than 2 liters 

## 2011-08-25 NOTE — Assessment & Plan Note (Addendum)
NYHA III. Volume status stable. Will not titrate medications due SBP<90.We will follow up with Dr Graciela Husbands regarding CRT upgrade. Follow up in 1 month.  Attending: Patient seen and examined with Tonye Becket, NP-C We discussed all aspects of the encounter. I agree with the assessment and plan as stated above. Improved but still tenuous. BP too soft to titrate HF meds currently. Volume status looks OK. Will pursue CRT-D.

## 2011-08-25 NOTE — Assessment & Plan Note (Signed)
No evidence of ischemia. Continue current regimen.   

## 2011-08-25 NOTE — Progress Notes (Signed)
Patient ID: [name], female   DOB: Feb 02, 1956, 56 y.o.   MRN: [id number] PCP:  Dala Dock   HPI: 56 year old female with history of HTN, DM2, polysubstance abuse (ETOH, tobacco, cocaine) and PAD. 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 At time of cath LAD, LCX and RCA occluded. EF 45%. Had abrupt stent occlusion the next day and had to go back to the lab. Had PCI of LAD and then underwent CABG x 5 with mitral valve annuloplasty.  ECHO 2011 EF 20-25% -> Medtronic ICD placed.   [facility] 03/17/11  RA = 8  RV = 47/1/9  PA = 52/12 (28)  PCW = 20 no significant v-waves  Fick cardiac output/index = 4.2/2.2  PVR =1.6 Woods  FA sat = 95%  PA sat = 61%, 63%  Ao Pressure: 89/58 (71)  LV Pressure: 86/12/24  Severe native CAD with all bypass grafts patent   Echo 03/2011: Severe LV dysfunction EF 10-15%   04/10/11: Stress Test (on Carvedilol)  Peak VO2: 13.6 % predicted peak VO2: 63.3% VE/VCO2 slope: 44.9 OUES: 1.11Peak RER: 1.12 Slope 45%  05/21/11 Lower extremity doppler negative for DVT  She is here for follow up.  Denies SOB/CP/PND/Orthonpnea. Weight at home 170-171 pounds. Denies lower extremity edema. Compliant with medications. Remains alcohol and rug free.   ROS: All systems negative except as listed in HPI, PMH and Problem List.  Past Medical History  Diagnosis Date  . Coronary artery disease   . Diabetes mellitus   . Hypertension   . CHF (congestive heart failure)     Systolic.  EF 10-15%  . Polysubstance abuse     history of  (cocaine, tobacco and ETOH)    Current Outpatient Prescriptions  Medication Sig Dispense Refill  . allopurinol (ZYLOPRIM) 100 MG tablet Take 200 mg by mouth daily.      [name] aspirin 81 MG chewable tablet Chew 81 mg by mouth daily.      . carvedilol (COREG) 6.25 MG tablet Take 3.125 mg by mouth 2 (two) times daily with a meal.       . digoxin (LANOXIN) 0.125 MG tablet Take 0.125 mg by mouth daily.      .  furosemide (LASIX) 40 MG tablet Take 40 mg by mouth 2 (two) times daily.      [name] glipiZIDE (GLUCOTROL) 10 MG tablet Take 10 mg by mouth 2 (two) times daily before a meal.      . lisinopril (PRINIVIL,ZESTRIL) 2.5 MG tablet Take 5 mg by mouth 2 (two) times daily.      . nitroGLYCERIN (NITROSTAT) 0.4 MG SL tablet Place 0.4 mg under the tongue every 5 (five) minutes as needed. For chest pain.      . pravastatin (PRAVACHOL) 40 MG tablet Take 40 mg by mouth at bedtime.      [name] spironolactone (ALDACTONE) 25 MG tablet Take 25 mg by mouth daily.      . traMADol (ULTRAM) 50 MG tablet Take 50 mg by mouth every 6 (six) hours as needed. pain         PHYSICAL EXAM: Filed Vitals:   08/25/11 1330  BP: 82/54  Pulse: 64  Resp: 18  Height: 5\' 4"  (1.626 m)  Weight: 171 lb 12.8 oz (77.928 kg)  SpO2: 96%   General:  Well appearing. No resp difficulty (sister present) HEENT: normal Neck: supple. JVP 5-6 Carotids 2+ bilaterally; no bruits. No lymphadenopathy or thryomegaly  appreciated. Cor: PMI normal. Regular rate & rhythm. No rubs, murmurs. Soft S3  Lungs: clear Abdomen: soft, nontender, nondistended. No hepatosplenomegaly. No bruits or masses. Good bowel sounds. Extremities: no cyanosis, clubbing, rash, edema  Neuro: alert & orientedx3, cranial nerves grossly intact. Moves all 4 extremities w/o difficulty. Affect pleasant.    ASSESSMENT & PLAN:

## 2011-09-01 ENCOUNTER — Other Ambulatory Visit (HOSPITAL_COMMUNITY): Payer: Self-pay | Admitting: Adult Health

## 2011-09-17 ENCOUNTER — Telehealth (HOSPITAL_COMMUNITY): Payer: Self-pay | Admitting: Internal Medicine

## 2011-09-17 NOTE — Telephone Encounter (Signed)
Discussed w/Lisa Vassie Loll PharmD ok for meds we will just need to follow dig level, pt is aware

## 2011-09-17 NOTE — Telephone Encounter (Signed)
[name] called and stated that Dr. Concepcion Living from [facility]  Put [name] on [place] medication (25mg ) taking one a day due to diabetes. Robin willison was told to call and ask if that Januvia would counteract with the Digoxin she is taking. Please call pt to further discuss her issue with the medications. Thanks.

## 2011-09-25 ENCOUNTER — Ambulatory Visit (HOSPITAL_COMMUNITY)
Admission: RE | Admit: 2011-09-25 | Discharge: 2011-09-25 | Disposition: A | Discharge status: Home / Self Care | Payer: Medicaid Other | Source: Ambulatory Visit | Attending: Internal Medicine | Admitting: Internal Medicine

## 2011-09-25 ENCOUNTER — Encounter (HOSPITAL_COMMUNITY): Payer: Self-pay

## 2011-09-25 VITALS — BP 80/50 | HR 69 | Wt 167.0 lb

## 2011-09-25 DIAGNOSIS — I5022 Chronic systolic (congestive) heart failure: Secondary | ICD-10-CM | POA: Insufficient documentation

## 2011-09-25 NOTE — Assessment & Plan Note (Signed)
Doing very well. NYHA II-III. Volume status looks good. BP still tenuous though. Unable to titrate meds further. Given results of CPX would favor upgrade to CRT-D.

## 2011-09-25 NOTE — Addendum Note (Signed)
Encounter addended by: Dolores Patty, MD on: 09/25/2011  2:02 PM<BR>     Documentation filed: Patient Instructions Section

## 2011-09-25 NOTE — Patient Instructions (Addendum)
Follow in 6 weeks with an ECHO  Do the following things EVERYDAY: 1) Weigh yourself in the morning before breakfast. Write it down and keep it in a log. 2) Take your medicines as prescribed 3) Eat low salt foods-Limit salt (sodium) to 2000 mg per day.  4) Stay as active as you can everyday 5) Limit all fluids for the day to less than 2 liters

## 2011-09-25 NOTE — Progress Notes (Signed)
Patient ID: [name], female   DOB: February 24, 1956, 56 y.o.   MRN: [id number] HPI: 56 year old female with history of HTN, DM2, polysubstance abuse (ETOH, tobacco, cocaine) and PAD. 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 At time of cath LAD, LCX and RCA occluded. EF 45%. Had abrupt stent occlusion the next day and had to go back to the lab. Had PCI of LAD and then underwent CABG x 5 with mitral valve annuloplasty.   ECHO 2011 EF 20-25% -> Medtronic ICD placed.  [facility] 03/17/11  RA = 8  RV = 47/1/9  PA = 52/12 (28)  PCW = 20 no significant v-waves  Fick cardiac output/index = 4.2/2.2  PVR =1.6 Woods  FA sat = 95%  PA sat = 61%, 63%  Ao Pressure: 89/58 (71)  LV Pressure: 86/12/24  Severe native CAD with all bypass grafts patent  Echo 03/2011: Severe LV dysfunction EF 10-15%  04/10/11: Stress Test (on Carvedilol)  Peak VO2: 13.6 % predicted peak VO2: 63.3% VE/VCO2 slope: 44.9 OUES: 1.11Peak RER: 1.12  Slope 45%   05/21/11 Lower extremity doppler negative for DVT   She is here for follow up with her sister.  Denies SOB/CP/PND/Orthonpnea. Weight at home 167-169 pounds. Denies lower extremity edema. Compliant with medications. Remains alcohol and drug free. Yesterday walk 1 mile at [employer] over 1 hour (stopped after each lap).  Compliant with medications. Tries to follow low salt.    ROS: All systems negative except as listed in HPI, PMH and Problem List.  Past Medical History  Diagnosis Date  . Coronary artery disease   . Diabetes mellitus   . Hypertension   . CHF (congestive heart failure)     Systolic.  EF 10-15%  . Polysubstance abuse     history of  (cocaine, tobacco and ETOH)    Current Outpatient Prescriptions  Medication Sig Dispense Refill  . allopurinol (ZYLOPRIM) 100 MG tablet Take 200 mg by mouth daily.      [name] aspirin 81 MG chewable tablet Chew 81 mg by mouth daily.      . carvedilol (COREG) 6.25 MG tablet Take  3.125 mg by mouth 2 (two) times daily with a meal.       . digoxin (LANOXIN) 0.125 MG tablet Take 0.125 mg by mouth daily.      . furosemide (LASIX) 40 MG tablet Take 40 mg by mouth 2 (two) times daily.      [name] glipiZIDE (GLUCOTROL) 10 MG tablet Take 10 mg by mouth 2 (two) times daily before a meal.      . lisinopril (PRINIVIL,ZESTRIL) 2.5 MG tablet Take 5 mg by mouth 2 (two) times daily.      [name] lisinopril (PRINIVIL,ZESTRIL) 2.5 MG tablet TAKE TWO TABLETS BY MOUTH TWICE DAILY  120 tablet  6  . nitroGLYCERIN (NITROSTAT) 0.4 MG SL tablet Place 0.4 mg under the tongue every 5 (five) minutes as needed. For chest pain.      . pravastatin (PRAVACHOL) 40 MG tablet Take 40 mg by mouth at bedtime.      [name] spironolactone (ALDACTONE) 25 MG tablet Take 25 mg by mouth daily.      . traMADol (ULTRAM) 50 MG tablet Take 50 mg by mouth every 6 (six) hours as needed. pain         PHYSICAL EXAM: Filed Vitals:   09/25/11 1324  BP: 80/50  Pulse: 69   Weight  change:  167 (171 pounds) General:  Well appearing. No resp difficulty HEENT: normal Neck: supple. JVP flat. Carotids 2+ bilaterally; no bruits. No lymphadenopathy or thryomegaly appreciated. Cor: PMI normal. Regular rate & rhythm. No rubs, gallops or murmurs. Lungs: clear Abdomen: soft, nontender, nondistended. No hepatosplenomegaly. No bruits or masses. Good bowel sounds. Extremities: no cyanosis, clubbing, rash, edema Neuro: alert & orientedx3, cranial nerves grossly intact. Moves all 4 extremities w/o difficulty. Affect pleasant.       ASSESSMENT & PLAN:

## 2011-10-24 ENCOUNTER — Other Ambulatory Visit (HOSPITAL_COMMUNITY): Payer: Self-pay

## 2011-10-24 MED ORDER — PRAVASTATIN SODIUM 40 MG PO TABS: 40.0000 mg | ORAL_TABLET | Freq: Every day | ORAL | Status: DC

## 2011-10-24 MED ORDER — GLIPIZIDE 10 MG PO TABS: 10.0000 mg | ORAL_TABLET | Freq: Two times a day (BID) | ORAL | Status: DC

## 2011-10-24 NOTE — Telephone Encounter (Signed)
..   Requested Prescriptions   Signed Prescriptions Disp Refills  . glipiZIDE (GLUCOTROL) 10 MG tablet 60 tablet 6    Sig: Take 1 tablet (10 mg total) by mouth 2 (two) times daily before a meal.    Authorizing Provider: Dolores Patty    Ordering User: Ikram Riebe M  . pravastatin (PRAVACHOL) 40 MG tablet 30 tablet 6    Sig: Take 1 tablet (40 mg total) by mouth at bedtime.    Authorizing Provider: Dolores Patty    Ordering User: Christella Hartigan, Magally Vahle Judie Petit

## 2011-11-10 ENCOUNTER — Other Ambulatory Visit (HOSPITAL_COMMUNITY): Payer: Self-pay | Admitting: *Deleted

## 2011-11-10 NOTE — Telephone Encounter (Signed)
Pt called requesting [place] to refill her accucheck test strips as [facility] shut down and she is on the waiting list for [facility], called in refill

## 2011-11-14 ENCOUNTER — Other Ambulatory Visit (HOSPITAL_COMMUNITY): Payer: Self-pay | Admitting: Internal Medicine

## 2011-11-17 ENCOUNTER — Encounter: Payer: Self-pay | Admitting: Internal Medicine

## 2011-11-17 ENCOUNTER — Ambulatory Visit (INDEPENDENT_AMBULATORY_CARE_PROVIDER_SITE_OTHER): Payer: Medicaid Other | Admitting: *Deleted

## 2011-11-17 ENCOUNTER — Ambulatory Visit (HOSPITAL_COMMUNITY)
Admission: RE | Admit: 2011-11-17 | Discharge: 2011-11-17 | Disposition: A | Discharge status: Home / Self Care | Payer: Medicaid Other | Source: Ambulatory Visit | Attending: Internal Medicine | Admitting: Internal Medicine

## 2011-11-17 VITALS — BP 102/58 | HR 85 | Ht 64.0 in | Wt 170.1 lb

## 2011-11-17 DIAGNOSIS — I5022 Chronic systolic (congestive) heart failure: Secondary | ICD-10-CM

## 2011-11-17 DIAGNOSIS — Z95810 Presence of automatic (implantable) cardiac defibrillator: Secondary | ICD-10-CM

## 2011-11-17 DIAGNOSIS — I255 Ischemic cardiomyopathy: Secondary | ICD-10-CM

## 2011-11-17 DIAGNOSIS — I2589 Other forms of chronic ischemic heart disease: Secondary | ICD-10-CM

## 2011-11-17 DIAGNOSIS — I059 Rheumatic mitral valve disease, unspecified: Secondary | ICD-10-CM

## 2011-11-17 LAB — BASIC METABOLIC PANEL
BUN: 47 mg/dL — ABNORMAL HIGH (ref 6–23)
Chloride: 100 mEq/L (ref 96–112)
Creatinine, Ser: 1.37 mg/dL — ABNORMAL HIGH (ref 0.50–1.10)
GFR calc Af Amer: 50 mL/min — ABNORMAL LOW (ref >90)

## 2011-11-17 MED ORDER — CARVEDILOL 6.25 MG PO TABS: 6.2500 mg | ORAL_TABLET | Freq: Two times a day (BID) | ORAL | Status: DC

## 2011-11-17 NOTE — Progress Notes (Signed)
  Echocardiogram 2D Echocardiogram has been performed.  [name] 11/17/2011, 8:54 AM

## 2011-11-17 NOTE — Patient Instructions (Addendum)
Follow up in 3 weeks  Take carvedilol 6.25 mg twice a day  Do the following things EVERYDAY: 1) Weigh yourself in the morning before breakfast. Write it down and keep it in a log. 2) Take your medicines as prescribed 3) Eat low salt foods-Limit salt (sodium) to 2000 mg per day.  4) Stay as active as you can everyday 5) Limit all fluids for the day to less than 2 liters

## 2011-11-17 NOTE — Progress Notes (Signed)
Patient ID: [name], female   DOB: [birth year], 56 y.o.   MRN: [id number] HPI: 56 year old female with history of HTN, DM2, polysubstance abuse (ETOH, tobacco, cocaine) and PAD. 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 At time of cath LAD, LCX and RCA occluded. EF 45%. Had abrupt stent occlusion the next day and had to go back to the lab. Had PCI of LAD and then underwent CABG x 5 with mitral valve annuloplasty.   ECHO 2011 EF 20-25% -> Medtronic ICD placed.  [facility] 03/17/11  RA = 8  RV = 47/1/9  PA = 52/12 (28)  PCW = 20 no significant v-waves  Fick cardiac output/index = 4.2/2.2  PVR =1.6 Woods  FA sat = 95%  PA sat = 61%, 63%  Ao Pressure: 89/58 (71)  LV Pressure: 86/12/24  Severe native CAD with all bypass grafts patent  Echo 03/2011: Severe LV dysfunction EF 10-15%  04/10/11: Stress Test (on Carvedilol)  Peak VO2: 13.6 ml/kg/min predicted peak VO2: 63.3% VE/VCO2 slope: 44.9 OUES: 1.11Peak RER: 1.12   05/21/11 Lower extremity doppler negative for DVT  11/17/11 ECHO EF 25-30%  She is here for follow up. DeniesCP/PND/Orthonpnea. Mild dyspnea walking. Weight at home 165-172 pounds. Compliant with medications. Remains alcohol and drug free since January 2013. She does not have PCP due to closing of [facility]. She has not been exercising.  Compliant with medications. Drinking less than  2 liters per day. Following low salt.     ROS: All systems negative except as listed in HPI, PMH and Problem List.  Past Medical History  Diagnosis Date  . Coronary artery disease   . Diabetes mellitus   . Hypertension   . CHF (congestive heart failure)     Systolic.  EF 10-15%  . Polysubstance abuse     history of  (cocaine, tobacco and ETOH)    Current Outpatient Prescriptions  Medication Sig Dispense Refill  . allopurinol (ZYLOPRIM) 100 MG tablet Take 200 mg by mouth daily.      [name] aspirin 81 MG chewable tablet Chew 81 mg by mouth daily.        . carvedilol (COREG) 6.25 MG tablet Take 3.125 mg by mouth 2 (two) times daily with a meal. 1 tab each morning and 2 tabs each evening      . digoxin (LANOXIN) 0.125 MG tablet Take 0.125 mg by mouth daily.      . furosemide (LASIX) 40 MG tablet Take 40 mg by mouth 2 (two) times daily.      [name] glipiZIDE (GLUCOTROL) 10 MG tablet Take 1 tablet (10 mg total) by mouth 2 (two) times daily before a meal.  60 tablet  6  . lisinopril (PRINIVIL,ZESTRIL) 2.5 MG tablet Take 5 mg by mouth 2 (two) times daily.      . metolazone (ZAROXOLYN) 2.5 MG tablet Take 2.5 mg by mouth as needed. For wt gain      . pravastatin (PRAVACHOL) 40 MG tablet Take 1 tablet (40 mg total) by mouth at bedtime.  30 tablet  6  . spironolactone (ALDACTONE) 25 MG tablet Take 25 mg by mouth daily.      . traMADol (ULTRAM) 50 MG tablet Take 50 mg by mouth every 6 (six) hours as needed. pain      . nitroGLYCERIN (NITROSTAT) 0.4 MG SL tablet Place 0.4 mg under the tongue every 5 (five) minutes as needed. For chest pain.      [name]  sitaGLIPtin (JANUVIA) 25 MG tablet Take 25 mg by mouth daily.      [name] DISCONTD: digoxin (LANOXIN) 0.125 MG tablet TAKE ONE TABLET BY MOUTH EVERY DAY  30 tablet  5     PHYSICAL EXAM: Filed Vitals:   11/17/11 0956  BP: 102/58  Pulse: 85   Weight change:  167 (171 pounds) General:  Well appearing. No resp difficulty HEENT: normal Neck: supple. JVP flat. Carotids 2+ bilaterally; no bruits. No lymphadenopathy or thryomegaly appreciated. Cor: PMI normal. Regular rate & rhythm. No rubs, gallops or murmurs. Lungs: clear Abdomen: soft, nontender, nondistended. No hepatosplenomegaly. No bruits or masses. Good bowel sounds. Extremities: no cyanosis, clubbing, rash, edema Neuro: alert & orientedx3, cranial nerves grossly intact. Moves all 4 extremities w/o difficulty. Affect pleasant.       ASSESSMENT & PLAN:

## 2011-11-17 NOTE — Assessment & Plan Note (Addendum)
NYHA II-III. Mild dyspnea with exertion.  Volume status stable. Increase carvedilol 6.25 mg twice a day.  [name] reviewed today's ECHO which showed minimal improvement. EF remains < 35%. Reinforced daily weight and limiting fluid intake to < 2 liters per day. Check BMET today. Continue to titrate HF medications as she tolerates however this has been  due to hypotension. Will not be able to titrate  Ace due to  Potassium 5.2. Follow with Dr Graciela Husbands today.  Follow up in 3 weeks.   Patient seen and examined with Tonye Becket, NP. We discussed all aspects of the encounter. I agree with the assessment and plan as stated above. She is relatively stable with NYHA II-III symptoms. I reviewed echo personally in clinic and EF 25-30% range. Agree with increasing carvedilol. She will f/u with Dr. Graciela Husbands today for further consideration of upgrading ICD to CRT-D, I think this indicated for her particularly given her slope of 45 on CPX. Will await Dr. Odessa Fleming input.

## 2011-11-17 NOTE — Assessment & Plan Note (Signed)
No evidence of ischemia. Continue current regimen.   

## 2011-11-19 LAB — REMOTE ICD DEVICE
BATTERY VOLTAGE: 3.1653 V
CHARGE TIME: 9.379 s
RV LEAD AMPLITUDE: 16.1 mv
RV LEAD IMPEDENCE ICD: 608 Ohm
TZAT-0001FASTVT: 1
TZAT-0001SLOWVT: 1
TZAT-0002FASTVT: NEGATIVE
TZAT-0012FASTVT: 200 ms
TZAT-0012SLOWVT: 200 ms
TZAT-0018FASTVT: NEGATIVE
TZON-0003VSLOWVT: 370 ms
TZON-0004SLOWVT: 16
TZON-0004VSLOWVT: 20
TZST-0001FASTVT: 4
TZST-0001FASTVT: 6
TZST-0001SLOWVT: 2
TZST-0001SLOWVT: 3
TZST-0001SLOWVT: 4
TZST-0002FASTVT: NEGATIVE
TZST-0002FASTVT: NEGATIVE
TZST-0002FASTVT: NEGATIVE
TZST-0002FASTVT: NEGATIVE
TZST-0002SLOWVT: NEGATIVE
TZST-0002SLOWVT: NEGATIVE
VF: 0

## 2011-12-05 ENCOUNTER — Encounter: Payer: Self-pay | Admitting: *Deleted

## 2011-12-06 ENCOUNTER — Other Ambulatory Visit (HOSPITAL_COMMUNITY): Payer: Self-pay | Admitting: Internal Medicine

## 2011-12-08 ENCOUNTER — Ambulatory Visit (HOSPITAL_COMMUNITY)
Admission: RE | Admit: 2011-12-08 | Discharge: 2011-12-08 | Disposition: A | Discharge status: Home / Self Care | Payer: Medicaid Other | Source: Ambulatory Visit | Attending: Internal Medicine | Admitting: Internal Medicine

## 2011-12-08 VITALS — BP 90/52 | HR 53 | Wt 175.2 lb

## 2011-12-08 DIAGNOSIS — I5022 Chronic systolic (congestive) heart failure: Secondary | ICD-10-CM | POA: Insufficient documentation

## 2011-12-08 LAB — BASIC METABOLIC PANEL
Calcium: 10.2 mg/dL (ref 8.4–10.5)
GFR calc Af Amer: 50 mL/min — ABNORMAL LOW (ref >90)
GFR calc non Af Amer: 43 mL/min — ABNORMAL LOW (ref >90)
Potassium: 4.6 mEq/L (ref 3.5–5.1)
Sodium: 135 mEq/L (ref 135–145)

## 2011-12-08 NOTE — Progress Notes (Signed)
HPI: 56 year old female with history of HTN, DM2, polysubstance abuse (ETOH, tobacco, cocaine) and PAD. 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 At time of cath LAD, LCX and RCA occluded. EF 45%. Had abrupt stent occlusion the next day and had to go back to the lab. Had PCI of LAD and then underwent CABG x 5 with mitral valve annuloplasty.   ECHO 2011 EF 20-25% -> Medtronic ICD placed.  [facility] 03/17/11  RA = 8  RV = 47/1/9  PA = 52/12 (28)  PCW = 20 no significant v-waves  Fick cardiac output/index = 4.2/2.2  PVR =1.6 Woods  FA sat = 95%  PA sat = 61%, 63%  Ao Pressure: 89/58 (71)  LV Pressure: 86/12/24  Severe native CAD with all bypass grafts patent  Echo 03/2011: Severe LV dysfunction EF 10-15%   04/10/11: CPX (on Carvedilol)  Peak VO2: 13.6 ml/kg/min predicted peak VO2: 63.3% VE/VCO2 slope: 44.9 OUES: 1.11Peak RER: 1.12   05/21/11 Lower extremity doppler negative for DVT  11/17/11 ECHO EF 25-30%  She is here for follow up.  She feels pretty good.  She has noted increased dyspnea with exertion but is able to walk around the grocery store without stopping.  Her weight is ranging 165-172 except for last week increased to 175 pounds took metolazone and weight improved.  She is compliant with her meds and fluid restrictions.  She has liberalized her salt intake.     ROS: All systems negative except as listed in HPI, PMH and Problem List.  Past Medical History  Diagnosis Date  . Coronary artery disease   . Diabetes mellitus   . Hypertension   . CHF (congestive heart failure)     Systolic.  EF 10-15%  . Polysubstance abuse     history of  (cocaine, tobacco and ETOH)    Current Outpatient Prescriptions  Medication Sig Dispense Refill  . allopurinol (ZYLOPRIM) 100 MG tablet Take 200 mg by mouth daily.      . carvedilol (COREG) 6.25 MG tablet Take 1 tablet (6.25 mg total) by mouth 2 (two) times daily with a meal.  60 tablet  3  . aspirin  81 MG chewable tablet Chew 81 mg by mouth daily.      . digoxin (LANOXIN) 0.125 MG tablet Take 0.125 mg by mouth daily.      . furosemide (LASIX) 40 MG tablet Take 40 mg by mouth 2 (two) times daily.      [name] glipiZIDE (GLUCOTROL) 10 MG tablet Take 1 tablet (10 mg total) by mouth 2 (two) times daily before a meal.  60 tablet  6  . lisinopril (PRINIVIL,ZESTRIL) 2.5 MG tablet Take 5 mg by mouth 2 (two) times daily.      . metolazone (ZAROXOLYN) 2.5 MG tablet Take 2.5 mg by mouth as needed. For wt gain      . nitroGLYCERIN (NITROSTAT) 0.4 MG SL tablet Place 0.4 mg under the tongue every 5 (five) minutes as needed. For chest pain.      . pravastatin (PRAVACHOL) 40 MG tablet Take 1 tablet (40 mg total) by mouth at bedtime.  30 tablet  6  . sitaGLIPtin (JANUVIA) 25 MG tablet Take 25 mg by mouth daily.      [name] spironolactone (ALDACTONE) 25 MG tablet Take 25 mg by mouth daily.      [name] spironolactone (ALDACTONE) 25 MG tablet TAKE ONE TABLET BY MOUTH EVERY DAY  30 tablet  5  . traMADol (ULTRAM) 50 MG tablet Take 50 mg by mouth every 6 (six) hours as needed. pain         PHYSICAL EXAM: Filed Vitals:   12/08/11 0927  BP: 90/52  Pulse: 53  Weight: 175 lb 4 oz (79.493 kg)  SpO2: 97%    General:  Well appearing. No resp difficulty HEENT: normal Neck: supple. JVP flat. Carotids 2+ bilaterally; no bruits. No lymphadenopathy or thryomegaly appreciated. Cor: PMI normal. Regular rate & rhythm. No rubs, gallops or murmurs. Lungs: clear Abdomen: soft, nontender, nondistended. No hepatosplenomegaly. No bruits or masses. Good bowel sounds. Extremities: no cyanosis, clubbing, rash, edema Neuro: alert & orientedx3, cranial nerves grossly intact. Moves all 4 extremities w/o difficulty. Affect pleasant.       ASSESSMENT & PLAN:

## 2011-12-08 NOTE — Patient Instructions (Addendum)
Follow up 2 months with Dr. Bensimhon 

## 2011-12-09 ENCOUNTER — Other Ambulatory Visit (HOSPITAL_COMMUNITY): Payer: Self-pay | Admitting: Cardiology

## 2011-12-09 DIAGNOSIS — I5022 Chronic systolic (congestive) heart failure: Secondary | ICD-10-CM

## 2011-12-09 MED ORDER — SPIRONOLACTONE 25 MG PO TABS: 25.0000 mg | ORAL_TABLET | Freq: Every day | ORAL | Status: DC

## 2011-12-09 NOTE — Assessment & Plan Note (Addendum)
Volume status looks good.  NYHA III.  Will continue current medications, BP too soft to titrate carvedilol or lisinopril today.  Will schedule follow up with Dr. Graciela Husbands for possible CRT-D upgrade.  Patient seen and examined with Ulyess Blossom, PA-C. We discussed all aspects of the encounter. I agree with the assessment and plan as stated above. CPX shows severe HF limitation (in transplant range). Will refer back to Dr. Graciela Husbands for consideration of CRT upgrade. BP too soft to permit further med titration at this time. She is not interested in initiating transplant work-up at this time. Will continue to follow closely.

## 2011-12-12 ENCOUNTER — Telehealth (HOSPITAL_COMMUNITY): Payer: Self-pay | Admitting: Cardiology

## 2011-12-12 NOTE — Telephone Encounter (Signed)
Pt request refill on DM meds, previous pt of [facility]. Ok to fill Rodrigo Ran gets in with new PCP  Januvia

## 2011-12-15 MED ORDER — SITAGLIPTIN PHOSPHATE 25 MG PO TABS: 25.0000 mg | ORAL_TABLET | Freq: Every day | ORAL | Status: DC

## 2011-12-15 NOTE — Telephone Encounter (Signed)
Pt scheduled to see Dr Dierdre Searles as new pt on 11/7 refill sent in, left mess on VM

## 2011-12-24 ENCOUNTER — Encounter: Payer: Self-pay | Admitting: Internal Medicine

## 2011-12-24 ENCOUNTER — Ambulatory Visit (INDEPENDENT_AMBULATORY_CARE_PROVIDER_SITE_OTHER): Payer: Medicaid Other | Admitting: Internal Medicine

## 2011-12-24 VITALS — BP 101/63 | HR 65 | Ht 64.0 in | Wt 176.0 lb

## 2011-12-24 DIAGNOSIS — Z95810 Presence of automatic (implantable) cardiac defibrillator: Secondary | ICD-10-CM

## 2011-12-24 DIAGNOSIS — I472 Ventricular tachycardia: Secondary | ICD-10-CM

## 2011-12-24 DIAGNOSIS — I255 Ischemic cardiomyopathy: Secondary | ICD-10-CM

## 2011-12-24 DIAGNOSIS — I2589 Other forms of chronic ischemic heart disease: Secondary | ICD-10-CM

## 2011-12-24 DIAGNOSIS — I5022 Chronic systolic (congestive) heart failure: Secondary | ICD-10-CM

## 2011-12-24 DIAGNOSIS — I4891 Unspecified atrial fibrillation: Secondary | ICD-10-CM

## 2011-12-24 LAB — ICD DEVICE OBSERVATION
BATTERY VOLTAGE: 3.1313 V
BRDY-0002RV: 40 {beats}/min
FVT: 0
TOT-0001: 3
TZAT-0001FASTVT: 1
TZAT-0002SLOWVT: NEGATIVE
TZAT-0012FASTVT: 200 ms
TZAT-0012SLOWVT: 200 ms
TZAT-0018FASTVT: NEGATIVE
TZAT-0020FASTVT: 1.5 ms
TZON-0003SLOWVT: 360 ms
TZST-0001FASTVT: 2
TZST-0001FASTVT: 4
TZST-0001FASTVT: 5
TZST-0001FASTVT: 6
TZST-0001SLOWVT: 3
TZST-0001SLOWVT: 4
TZST-0001SLOWVT: 5
TZST-0002FASTVT: NEGATIVE
TZST-0002FASTVT: NEGATIVE
TZST-0002SLOWVT: NEGATIVE
TZST-0002SLOWVT: NEGATIVE
TZST-0002SLOWVT: NEGATIVE
VENTRICULAR PACING ICD: 0 pct
VF: 0

## 2011-12-24 NOTE — Assessment & Plan Note (Signed)
The patient's device was interrogated.  The information was reviewed. No changes were made in the programming.    

## 2011-12-24 NOTE — Patient Instructions (Signed)
Will contact you to arrange CRT upgrade for the week of November 18th.

## 2011-12-24 NOTE — Assessment & Plan Note (Signed)
She is well medicated according to guidelines. She continues with class 2-3 symptoms.

## 2011-12-24 NOTE — Assessment & Plan Note (Signed)
She is stable class IIB symptoms. Her QRS duration is borderline appropriate for CRT upgrade. I have reviewed with her the above issues and the risks of infection. I would like to discuss with Dr. Dorthea Cove anticipated benefits of CRT upgrade which is appropriate but I don't know how much benefit she will gain given her modest symptoms have been mitigating eaffect of her borderline QRS duration and the ischemic nature of cardiomyopathy

## 2011-12-24 NOTE — Progress Notes (Signed)
Patient Care Team: Jaclyn Shaggy, MD as PCP - General (Family Medicine) Duke Salvia, MD (Cardiology) Dolores Patty, MD (Cardiology)   HPI  [name] is a 56 y.o. 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  Catheterization 2013-January demonstrated patent grafts cardiac index 2.2 ejection fraction 10-15%  Electrocardiogram April 2013 demonstrated a QRS duration of 124 ms  repeat today is 126.  She only has class II symptoms at this point, maybe class IIb. She is able to do her grocery shopping. She is able to walk across the football field up the stairs. She does not have peripheral edema nocturnal dyspnea or orthopnea.    Past Medical History  Diagnosis Date  . Coronary artery disease   . Diabetes mellitus   . Hypertension   . CHF (congestive heart failure)     Systolic.  EF 10-15%  . Polysubstance abuse     history of  (cocaine, tobacco and ETOH)    Past Surgical History  Procedure Date  . Coronary artery bypass graft   . Cardiac catheterization     Current Outpatient Prescriptions  Medication Sig Dispense Refill  . allopurinol (ZYLOPRIM) 100 MG tablet Take 200 mg by mouth daily.      [name] aspirin 81 MG chewable tablet Chew 81 mg by mouth daily.      . carvedilol (COREG) 6.25 MG tablet Take 1 tablet (6.25 mg total) by mouth 2 (two) times daily with a meal.  60 tablet  3  . digoxin (LANOXIN) 0.125 MG tablet Take 0.125 mg by mouth daily.      . furosemide (LASIX) 40 MG tablet Take 40 mg by mouth 2 (two) times daily.      [name] glipiZIDE (GLUCOTROL) 10 MG tablet Take 1 tablet (10 mg total) by mouth 2 (two) times daily before a meal.  60 tablet  6  . lisinopril (PRINIVIL,ZESTRIL) 2.5 MG tablet Take 5 mg by mouth 2 (two) times daily.      . metolazone (ZAROXOLYN) 2.5 MG tablet Take 2.5 mg by mouth as needed. For wt gain      . nitroGLYCERIN  (NITROSTAT) 0.4 MG SL tablet Place 0.4 mg under the tongue every 5 (five) minutes as needed. For chest pain.      . pravastatin (PRAVACHOL) 40 MG tablet Take 1 tablet (40 mg total) by mouth at bedtime.  30 tablet  6  . sitaGLIPtin (JANUVIA) 25 MG tablet Take 1 tablet (25 mg total) by mouth daily.  30 tablet  1  . spironolactone (ALDACTONE) 25 MG tablet Take 1 tablet (25 mg total) by mouth daily.  30 tablet  5  . traMADol (ULTRAM) 50 MG tablet Take 50 mg by mouth every 6 (six) hours as needed. pain        No Known Allergies  Review of Systems negative except from HPI and PMH  Physical Exam BP 101/63  Pulse 65  Ht 5\' 4"  (1.626 m)  Wt 176 lb (79.833 kg)  BMI 30.21 kg/m2 Well developed and well nourished in no acute distress HENT normal E scleral and icterus clear Neck Supple JVP flat; carotids brisk and full Clear to ausculation Regular rate and rhythm, no murmurs gallops or rub Soft with active bowel sounds No clubbing cyanosis none Edema Alert and oriented, grossly normal motor and sensory function Skin Warm and Dry  Electrocardiogram  demonstrates sinus rhythm at 69 intervals 19/13/390 Axis is leftward at -2 There are no Q waves noted 1 or aVL  Assessment and  Plan

## 2011-12-25 ENCOUNTER — Other Ambulatory Visit (HOSPITAL_COMMUNITY): Payer: Self-pay | Admitting: *Deleted

## 2011-12-25 MED ORDER — FUROSEMIDE 40 MG PO TABS: 40.0000 mg | ORAL_TABLET | Freq: Two times a day (BID) | ORAL | Status: DC

## 2012-01-02 ENCOUNTER — Other Ambulatory Visit: Payer: Self-pay | Admitting: *Deleted

## 2012-01-02 ENCOUNTER — Encounter: Payer: Self-pay | Admitting: *Deleted

## 2012-01-02 DIAGNOSIS — I5022 Chronic systolic (congestive) heart failure: Secondary | ICD-10-CM

## 2012-01-02 DIAGNOSIS — I4891 Unspecified atrial fibrillation: Secondary | ICD-10-CM

## 2012-01-03 ENCOUNTER — Other Ambulatory Visit (HOSPITAL_COMMUNITY): Payer: Self-pay | Admitting: Adult Health

## 2012-01-07 ENCOUNTER — Telehealth: Payer: Self-pay | Admitting: Internal Medicine

## 2012-01-07 NOTE — Telephone Encounter (Signed)
New problem:  Status of paperwork for upcoming procedure.

## 2012-01-08 ENCOUNTER — Encounter: Payer: Medicaid Other | Admitting: Internal Medicine

## 2012-01-08 NOTE — Telephone Encounter (Signed)
Pt received paperwork in the mail today.

## 2012-01-09 ENCOUNTER — Encounter (HOSPITAL_COMMUNITY): Payer: Self-pay | Admitting: Pharmacy Technician

## 2012-01-14 ENCOUNTER — Other Ambulatory Visit: Payer: Self-pay | Admitting: Internal Medicine

## 2012-01-14 DIAGNOSIS — I428 Other cardiomyopathies: Secondary | ICD-10-CM

## 2012-01-15 ENCOUNTER — Other Ambulatory Visit (INDEPENDENT_AMBULATORY_CARE_PROVIDER_SITE_OTHER): Payer: Medicaid Other

## 2012-01-15 DIAGNOSIS — I4891 Unspecified atrial fibrillation: Secondary | ICD-10-CM

## 2012-01-15 DIAGNOSIS — I5022 Chronic systolic (congestive) heart failure: Secondary | ICD-10-CM

## 2012-01-15 LAB — CBC WITH DIFFERENTIAL/PLATELET
Basophils Absolute: 0 10*3/uL (ref 0.0–0.1)
Eosinophils Absolute: 0.1 10*3/uL (ref 0.0–0.7)
Eosinophils Relative: 0.9 % (ref 0.0–5.0)
MCV: 92.2 fl (ref 78.0–100.0)
Monocytes Absolute: 0.4 10*3/uL (ref 0.1–1.0)
Neutrophils Relative %: 78.8 % — ABNORMAL HIGH (ref 43.0–77.0)
Platelets: 157 10*3/uL (ref 150.0–400.0)
RDW: 15.9 % — ABNORMAL HIGH (ref 11.5–14.6)
WBC: 7.7 10*3/uL (ref 4.5–10.5)

## 2012-01-15 LAB — BASIC METABOLIC PANEL
GFR: 48.69 mL/min — ABNORMAL LOW (ref >60.00)
Glucose, Bld: 423 mg/dL — ABNORMAL HIGH (ref 70–99)
Potassium: 4.7 mEq/L (ref 3.5–5.1)
Sodium: 132 mEq/L — ABNORMAL LOW (ref 135–145)

## 2012-01-15 LAB — PROTIME-INR: INR: 1.1 ratio — ABNORMAL HIGH (ref 0.8–1.0)

## 2012-01-15 LAB — APTT: aPTT: 32.6 s — ABNORMAL HIGH (ref 21.7–28.8)

## 2012-01-21 MED ORDER — CEFAZOLIN SODIUM-DEXTROSE 2-3 GM-% IV SOLR
2.0000 g | INTRAVENOUS | Status: DC
  Filled 2012-01-21 (×2): qty 50

## 2012-01-21 MED ORDER — SODIUM CHLORIDE 0.9 % IR SOLN
80.0000 mg | Status: DC
  Filled 2012-01-21: qty 2

## 2012-01-22 ENCOUNTER — Encounter (HOSPITAL_COMMUNITY)
Admission: RE | Disposition: A | Discharge status: Home / Self Care | Payer: Self-pay | Source: Ambulatory Visit | Attending: Internal Medicine

## 2012-01-22 ENCOUNTER — Ambulatory Visit (HOSPITAL_COMMUNITY)
Admission: RE | Admit: 2012-01-22 | Discharge: 2012-01-22 | Disposition: A | Discharge status: Home / Self Care | Payer: Medicaid Other | Source: Ambulatory Visit | Attending: Internal Medicine | Admitting: Internal Medicine

## 2012-01-22 ENCOUNTER — Encounter (HOSPITAL_COMMUNITY): Payer: Self-pay | Admitting: *Deleted

## 2012-01-22 ENCOUNTER — Emergency Department (HOSPITAL_COMMUNITY)
Admission: EM | Admit: 2012-01-22 | Discharge: 2012-01-22 | Disposition: A | Discharge status: Home / Self Care | Payer: Medicaid Other | Source: Home / Self Care

## 2012-01-22 DIAGNOSIS — E119 Type 2 diabetes mellitus without complications: Secondary | ICD-10-CM | POA: Diagnosis present

## 2012-01-22 DIAGNOSIS — Z538 Procedure and treatment not carried out for other reasons: Secondary | ICD-10-CM | POA: Insufficient documentation

## 2012-01-22 DIAGNOSIS — I428 Other cardiomyopathies: Secondary | ICD-10-CM

## 2012-01-22 DIAGNOSIS — Z95810 Presence of automatic (implantable) cardiac defibrillator: Secondary | ICD-10-CM

## 2012-01-22 DIAGNOSIS — I509 Heart failure, unspecified: Secondary | ICD-10-CM | POA: Insufficient documentation

## 2012-01-22 LAB — SURGICAL PCR SCREEN: Staphylococcus aureus: NEGATIVE

## 2012-01-22 LAB — GLUCOSE, CAPILLARY: Glucose-Capillary: 313 mg/dL — ABNORMAL HIGH (ref 70–99)

## 2012-01-22 SURGERY — BI-VENTRICULAR IMPLANTABLE CARDIOVERTER DEFIBRILLATOR UPGRADE
Anesthesia: LOCAL

## 2012-01-22 MED ORDER — GLIPIZIDE 10 MG PO TABS: 15.0000 mg | ORAL_TABLET | Freq: Two times a day (BID) | ORAL | Status: DC

## 2012-01-22 MED ORDER — MUPIROCIN 2 % EX OINT
TOPICAL_OINTMENT | Freq: Two times a day (BID) | CUTANEOUS | Status: DC
  Filled 2012-01-22: qty 22

## 2012-01-22 MED ORDER — MUPIROCIN 2 % EX OINT
TOPICAL_OINTMENT | CUTANEOUS | Status: AC
  Filled 2012-01-22: qty 22

## 2012-01-22 MED ORDER — SODIUM CHLORIDE 0.9 % IJ SOLN: 3.0000 mL | INTRAMUSCULAR | Status: DC | PRN

## 2012-01-22 MED ORDER — SODIUM CHLORIDE 0.9 % IV SOLN
250.0000 mL | INTRAVENOUS | Status: DC
  Administered 2012-01-22: 1000 mL via INTRAVENOUS

## 2012-01-22 MED ORDER — LIDOCAINE HCL (PF) 1 % IJ SOLN
INTRAMUSCULAR | Status: AC
  Filled 2012-01-22: qty 60

## 2012-01-22 MED ORDER — SODIUM CHLORIDE 0.45 % IV SOLN
INTRAVENOUS | Status: DC
  Administered 2012-01-22: 1000 mL via INTRAVENOUS

## 2012-01-22 MED ORDER — SODIUM CHLORIDE 0.9 % IJ SOLN: 3.0000 mL | Freq: Two times a day (BID) | INTRAMUSCULAR | Status: DC

## 2012-01-22 MED ORDER — CHLORHEXIDINE GLUCONATE 4 % EX LIQD: 60.0000 mL | Freq: Once | CUTANEOUS | Status: DC

## 2012-01-22 NOTE — ED Notes (Signed)
Pt reports need for glucose control  - recently diagnosed 3 months ago with diabetes. Glucose has been staying in the 300/400 range for the past few weeks.

## 2012-01-22 NOTE — Interval H&P Note (Signed)
History and Physical Interval Note:  01/22/2012 7:44 AM  [name]  has presented today for surgery, with the diagnosis of Heart failure  The various methods of treatment have been discussed with the patient and family. After consideration of risks, benefits and other options for treatment, the patient has consented to  Procedure(s) (LRB) with comments: BI-VENTRICULAR IMPLANTABLE CARDIOVERTER DEFIBRILLATOR UPGRADE (N/A) as a surgical intervention .  The patient's history has been reviewed, patient examined, no change in status, stable for surgery.  I have reviewed the patient's chart and labs.  Questions were answered to the patient's satisfaction.     Sherryl Manges  Recent data has brought into question the value of CRT in pts with 130 or less even w LBBB and esp with increased risks assco w upgrade  We will defer procedure for now  BS>300  Will ask DM coordinator for advice

## 2012-01-22 NOTE — H&P (View-Only) (Signed)
Patient Care Team: Enobong Amao, MD as PCP - General (Family Medicine) Marvie Brevik C Suliman Termini, MD (Cardiology) Daniel R Bensimhon, MD (Cardiology)   HPI  [name] is a 56 y.o. 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  Catheterization 2013-January demonstrated patent grafts cardiac index 2.2 ejection fraction 10-15%  Electrocardiogram April 2013 demonstrated a QRS duration of 124 ms  repeat today is 126.  She only has class II symptoms at this point, maybe class IIb. She is able to do her grocery shopping. She is able to walk across the football field up the stairs. She does not have peripheral edema nocturnal dyspnea or orthopnea.    Past Medical History  Diagnosis Date  . Coronary artery disease   . Diabetes mellitus   . Hypertension   . CHF (congestive heart failure)     Systolic.  EF 10-15%  . Polysubstance abuse     history of  (cocaine, tobacco and ETOH)    Past Surgical History  Procedure Date  . Coronary artery bypass graft   . Cardiac catheterization     Current Outpatient Prescriptions  Medication Sig Dispense Refill  . allopurinol (ZYLOPRIM) 100 MG tablet Take 200 mg by mouth daily.      . aspirin 81 MG chewable tablet Chew 81 mg by mouth daily.      . carvedilol (COREG) 6.25 MG tablet Take 1 tablet (6.25 mg total) by mouth 2 (two) times daily with a meal.  60 tablet  3  . digoxin (LANOXIN) 0.125 MG tablet Take 0.125 mg by mouth daily.      . furosemide (LASIX) 40 MG tablet Take 40 mg by mouth 2 (two) times daily.      . glipiZIDE (GLUCOTROL) 10 MG tablet Take 1 tablet (10 mg total) by mouth 2 (two) times daily before a meal.  60 tablet  6  . lisinopril (PRINIVIL,ZESTRIL) 2.5 MG tablet Take 5 mg by mouth 2 (two) times daily.      . metolazone (ZAROXOLYN) 2.5 MG tablet Take 2.5 mg by mouth as needed. For wt gain      . nitroGLYCERIN  (NITROSTAT) 0.4 MG SL tablet Place 0.4 mg under the tongue every 5 (five) minutes as needed. For chest pain.      . pravastatin (PRAVACHOL) 40 MG tablet Take 1 tablet (40 mg total) by mouth at bedtime.  30 tablet  6  . sitaGLIPtin (JANUVIA) 25 MG tablet Take 1 tablet (25 mg total) by mouth daily.  30 tablet  1  . spironolactone (ALDACTONE) 25 MG tablet Take 1 tablet (25 mg total) by mouth daily.  30 tablet  5  . traMADol (ULTRAM) 50 MG tablet Take 50 mg by mouth every 6 (six) hours as needed. pain        No Known Allergies  Review of Systems negative except from HPI and PMH  Physical Exam BP 101/63  Pulse 65  Ht 5' 4" (1.626 m)  Wt 176 lb (79.833 kg)  BMI 30.21 kg/m2 Well developed and well nourished in no acute distress HENT normal E scleral and icterus clear Neck Supple JVP flat; carotids brisk and full Clear to ausculation Regular rate and rhythm, no murmurs gallops or rub Soft with active bowel sounds No clubbing cyanosis none Edema Alert and oriented, grossly normal motor and sensory function Skin Warm and Dry  Electrocardiogram   demonstrates sinus rhythm at 69 intervals 19/13/390 Axis is leftward at -2 There are no Q waves noted 1 or aVL  Assessment and  Plan  

## 2012-01-22 NOTE — ED Provider Notes (Signed)
Patient Demographics  [name], is a 56 y.o. female  OZH:[id number]  GEX:[id number]  DOB - 08-17-1955  Chief Complaint  Patient presents with  . Hyperglycemia  . Medication Refill        Subjective:   [name] today has, No headache, No chest pain, No abdominal pain - No Nausea, No new weakness tingling or numbness, No Cough - SOB.   Objective:    Filed Vitals:   01/22/12 1014  BP: 111/66  Pulse: 69  Temp: 98.1 F (36.7 C)  TempSrc: Oral  Resp: 18  SpO2: 98%     Exam  Awake Alert, Oriented X 3, No new F.N deficits, Normal affect [place].AT,PERRAL Supple Neck,No JVD, No cervical lymphadenopathy appriciated.  Symmetrical Chest wall movement, Good air movement bilaterally, CTAB RRR,No Gallops,Rubs or new Murmurs, No Parasternal Heave +ve B.Sounds, Abd Soft, Non tender, No organomegaly appriciated, No rebound - guarding or rigidity. No Cyanosis, Clubbing or edema, No new Rash or bruise     Data Review   CBC No results found for this basename: WBC:5,HGB:5,HCT:5,PLT:5,MCV:5,MCH:5,MCHC:5,RDW:5,NEUTRABS:5,LYMPHSABS:5,MONOABS:5,EOSABS:5,BASOSABS:5,BANDABS:5,BANDSABD:5 in the last 168 hours  Chemistries   No results found for this basename: NA:5,K:5,CL:5,CO2:5,GLUCOSE:5,BUN:5,CREATININE:5,GFRCGP,:5,CALCIUM:5,MG:5,AST:5,ALT:5,ALKPHOS:5,BILITOT:5 in the last 168 hours ------------------------------------------------------------------------------------------------------------------ No results found for this basename: HGBA1C:2 in the last 72 hours ------------------------------------------------------------------------------------------------------------------ No results found for this basename: CHOL:2,HDL:2,LDLCALC:2,TRIG:2,CHOLHDL:2,LDLDIRECT:2 in the last 72 hours ------------------------------------------------------------------------------------------------------------------ No results found for this basename: TSH,T4TOTAL,FREET3,T3FREE,THYROIDAB in the last 72  hours ------------------------------------------------------------------------------------------------------------------ No results found for this basename: VITAMINB12:2,FOLATE:2,FERRITIN:2,TIBC:2,IRON:2,RETICCTPCT:2 in the last 72 hours  Coagulation profile  No results found for this basename: INR:5,PROTIME:5 in the last 168 hours  Urinalysis    Component Value Date/Time   COLORURINE YELLOW 06/19/2011 1033   APPEARANCEUR CLOUDY* 06/19/2011 1033   LABSPEC 1.021 06/19/2011 1033   PHURINE 5.0 06/19/2011 1033   GLUCOSEU >1000* 06/19/2011 1033   HGBUR NEGATIVE 06/19/2011 1033   BILIRUBINUR NEGATIVE 06/19/2011 1033   KETONESUR NEGATIVE 06/19/2011 1033   PROTEINUR 30* 06/19/2011 1033   UROBILINOGEN 1.0 06/19/2011 1033   NITRITE NEGATIVE 06/19/2011 1033   LEUKOCYTESUR TRACE* 06/19/2011 1033     Prior to Admission medications   Medication Sig Start Date End Date Taking? Authorizing Provider  aspirin 81 MG chewable tablet Chew 81 mg by mouth daily.   Yes Ripudeep Jenna Luo, MD  digoxin (LANOXIN) 0.125 MG tablet Take 0.125 mg by mouth daily.   Yes Dolores Patty, MD  furosemide (LASIX) 40 MG tablet Take 1 tablet (40 mg total) by mouth 2 (two) times daily. 12/25/11  Yes Bevelyn Buckles Bensimhon, MD  lisinopril (PRINIVIL,ZESTRIL) 2.5 MG tablet Take 5 mg by mouth 2 (two) times daily.   Yes Amy D Clegg, NP  metolazone (ZAROXOLYN) 2.5 MG tablet Take 2.5 mg by mouth as needed. For wt gain   Yes Historical Provider, MD  pravastatin (PRAVACHOL) 40 MG tablet Take 1 tablet (40 mg total) by mouth at bedtime. 10/24/11  Yes Bevelyn Buckles Bensimhon, MD  sitaGLIPtin (JANUVIA) 25 MG tablet Take 1 tablet (25 mg total) by mouth daily. 12/15/11  Yes Dolores Patty, MD  spironolactone (ALDACTONE) 25 MG tablet Take 1 tablet (25 mg total) by mouth daily. 12/09/11  Yes Dolores Patty, MD  allopurinol (ZYLOPRIM) 100 MG tablet Take 100 mg by mouth 2 (two) times daily.     Amy Georgie Chard, NP  carvedilol (COREG) 6.25 MG tablet Take 1  tablet (6.25 mg total) by mouth 2 (two) times daily with a meal. 11/17/11   Amy D Filbert Schilder, NP  glipiZIDE (GLUCOTROL) 10 MG tablet Take 1.5 tablets (15 mg total) by mouth 2 (two) times daily before a meal. 01/22/12   Leroy Sea, MD  nitroGLYCERIN (NITROSTAT) 0.4 MG SL tablet Place 0.4 mg under the tongue every 5 (five) minutes as needed. For chest pain.    Historical Provider, MD     Assessment & Plan   Pt with H/O DM-2, here for sugars running >300, no subjective complains, have increased Glucotrol to 15mg  Po BID, instructed to check sugars QAC-HS, back in 4 days, may need insulin. Check A1c next visit.  Lab Results  Component Value Date   HGBA1C 6.1* 03/16/2011      Leroy Sea M.D on 01/22/2012 at 10:50 AM   Leroy Sea, MD 01/22/12 1052

## 2012-01-22 NOTE — Progress Notes (Signed)
Inpatient Diabetes Program Recommendations  AACE/ADA: New Consensus Statement on Inpatient Glycemic Control (2013)  Target Ranges:  Prepandial:   less than 140 mg/dL      Peak postprandial:   less than 180 mg/dL (1-2 hours)      Critically ill patients:  140 - 180 mg/dL   Reason for Visit: Referral received.  Patient states that her CBG's have been high between 300-400's.  She was a patient at [facility] and therefore has lost her PCP.  She was taking Glucotrol and Januvia prior to admit, however they are not bringing her CBG's down and she is concerned.  Gave her Living well with diabetes booklet and encouraged her to follow-up at [facility] clinic.  No current A1C available.  She has meter and would benefit from further education, however it appears that she needs changes in her medication regimen.  Patient seemed very engaged and appreciated my visit.

## 2012-01-23 NOTE — ED Notes (Signed)
Pt called stating that provider was supposed to called in tramadol.  Per Dr. Thedore Mins he was not prescribing patient any medications for pain

## 2012-01-27 ENCOUNTER — Emergency Department (HOSPITAL_COMMUNITY)
Admission: EM | Admit: 2012-01-27 | Discharge: 2012-01-27 | Disposition: A | Discharge status: Home / Self Care | Payer: Medicaid Other | Source: Home / Self Care

## 2012-01-27 ENCOUNTER — Encounter (HOSPITAL_COMMUNITY): Payer: Self-pay

## 2012-01-27 DIAGNOSIS — N189 Chronic kidney disease, unspecified: Secondary | ICD-10-CM

## 2012-01-27 DIAGNOSIS — R739 Hyperglycemia, unspecified: Secondary | ICD-10-CM

## 2012-01-27 DIAGNOSIS — I255 Ischemic cardiomyopathy: Secondary | ICD-10-CM

## 2012-01-27 DIAGNOSIS — Z23 Encounter for immunization: Secondary | ICD-10-CM

## 2012-01-27 DIAGNOSIS — I4891 Unspecified atrial fibrillation: Secondary | ICD-10-CM

## 2012-01-27 DIAGNOSIS — E119 Type 2 diabetes mellitus without complications: Secondary | ICD-10-CM

## 2012-01-27 DIAGNOSIS — R748 Abnormal levels of other serum enzymes: Secondary | ICD-10-CM

## 2012-01-27 LAB — HEMOGLOBIN A1C
Hgb A1c MFr Bld: 9.6 % — ABNORMAL HIGH (ref <5.7)
Mean Plasma Glucose: 229 mg/dL — ABNORMAL HIGH (ref <117)

## 2012-01-27 LAB — COMPREHENSIVE METABOLIC PANEL
Albumin: 4.2 g/dL (ref 3.5–5.2)
BUN: 42 mg/dL — ABNORMAL HIGH (ref 6–23)
Calcium: 10.6 mg/dL — ABNORMAL HIGH (ref 8.4–10.5)
Creatinine, Ser: 1.38 mg/dL — ABNORMAL HIGH (ref 0.50–1.10)
GFR calc Af Amer: 49 mL/min — ABNORMAL LOW (ref >90)
Glucose, Bld: 251 mg/dL — ABNORMAL HIGH (ref 70–99)
Total Protein: 8.4 g/dL — ABNORMAL HIGH (ref 6.0–8.3)

## 2012-01-27 MED ORDER — GLIPIZIDE 10 MG PO TABS: 10.0000 mg | ORAL_TABLET | Freq: Two times a day (BID) | ORAL | Status: DC

## 2012-01-27 MED ORDER — INSULIN PEN NEEDLE 31G X 8 MM MISC: 1.0000 | Status: DC

## 2012-01-27 MED ORDER — INSULIN GLARGINE 100 UNIT/ML ~~LOC~~ SOLN: 12.0000 [IU] | Freq: Every day | SUBCUTANEOUS | Status: DC

## 2012-01-27 MED ORDER — INFLUENZA VIRUS VACC SPLIT PF IM SUSP
0.5000 mL | Freq: Once | INTRAMUSCULAR | Status: AC
  Administered 2012-01-27: 0.5 mL via INTRAMUSCULAR

## 2012-01-27 MED ORDER — INSULIN ASPART 100 UNIT/ML ~~LOC~~ SOLN
SUBCUTANEOUS | Status: AC
  Filled 2012-01-27: qty 1

## 2012-01-27 MED ORDER — INSULIN ASPART 100 UNIT/ML ~~LOC~~ SOLN
7.0000 [IU] | Freq: Once | SUBCUTANEOUS | Status: AC
  Administered 2012-01-27: 7 [IU] via SUBCUTANEOUS

## 2012-01-27 MED ORDER — TRAMADOL HCL 50 MG PO TABS: 50.0000 mg | ORAL_TABLET | Freq: Four times a day (QID) | ORAL | Status: DC | PRN

## 2012-01-27 NOTE — ED Notes (Signed)
Patient states here for follow up on her blood sugar- has been running high

## 2012-01-27 NOTE — ED Provider Notes (Signed)
History     CSN: [id number]  Arrival date & time 01/27/12  1005   None     Chief Complaint  Patient presents with  . Follow-up    blood sugar    (Consider location/radiation/quality/duration/timing/severity/associated sxs/prior treatment) HPI Pt having persistent hyperglycemia.  BS readings 200-455.  The patient is here to followup diabetes mellitus.  She was seen last week in his clinic.  She reports that she has been testing her blood glucose 4 times per day and she didn't bring the readings in to the office today.  She reports that she feels fatigued but otherwise has been doing well.  She is concerned about the elevated blood sugars.  She is concerned about further damage to her body from the uncontrolled diabetes mellitus.  She reports that she has increased the dose of oral medications as prescribed at the last office visit but she has noticed no improvement in blood glucose control.   Past Medical History  Diagnosis Date  . Coronary artery disease   . Diabetes mellitus   . Hypertension   . CHF (congestive heart failure)     Systolic.  EF 10-15%  . Polysubstance abuse     history of  (cocaine, tobacco and ETOH)    Past Surgical History  Procedure Date  . Coronary artery bypass graft   . Cardiac catheterization   . Cardiac defibrillator placement     Family History  Problem Relation Age of Onset  . Heart attack Mother     Died age 31  . Diabetes Maternal Grandmother   . Heart attack Cousin   . Heart attack Maternal Uncle     History  Substance Use Topics  . Smoking status: Former Smoker    Quit date: 03/03/2011  . Smokeless tobacco: Not on file  . Alcohol Use: Yes     Comment: Had some drinks New Years, describes social drinkingC    OB History    Grav Para Term Preterm Abortions TAB SAB Ect Mult Living                  Review of Systems  Constitutional: Negative.   HENT: Negative.   Eyes: Positive for visual disturbance. Negative for discharge  and itching.  Respiratory: Negative.   Cardiovascular: Negative.   Gastrointestinal: Negative.   Genitourinary: Positive for frequency and enuresis.  Musculoskeletal: Negative.   Neurological: Negative.   Hematological: Negative.   Psychiatric/Behavioral: Negative.     Allergies  Review of patient's allergies indicates no known allergies.  Home Medications   Current Outpatient Rx  Name  Route  Sig  Dispense  Refill  . ALLOPURINOL 100 MG PO TABS   Oral   Take 100 mg by mouth 2 (two) times daily.          . ASPIRIN 81 MG PO CHEW   Oral   Chew 81 mg by mouth daily.         [name] CARVEDILOL 6.25 MG PO TABS   Oral   Take 1 tablet (6.25 mg total) by mouth 2 (two) times daily with a meal.   60 tablet   3   . DIGOXIN 0.125 MG PO TABS   Oral   Take 0.125 mg by mouth daily.         . FUROSEMIDE 40 MG PO TABS   Oral   Take 1 tablet (40 mg total) by mouth 2 (two) times daily.   60 tablet   6   .  GLIPIZIDE 10 MG PO TABS   Oral   Take 1.5 tablets (15 mg total) by mouth 2 (two) times daily before a meal.   60 tablet   1   . LISINOPRIL 2.5 MG PO TABS   Oral   Take 5 mg by mouth 2 (two) times daily.         [name] PRAVASTATIN SODIUM 40 MG PO TABS   Oral   Take 1 tablet (40 mg total) by mouth at bedtime.   30 tablet   6   . SITAGLIPTIN PHOSPHATE 25 MG PO TABS   Oral   Take 1 tablet (25 mg total) by mouth daily.   30 tablet   1   . SPIRONOLACTONE 25 MG PO TABS   Oral   Take 1 tablet (25 mg total) by mouth daily.   30 tablet   5   . METOLAZONE 2.5 MG PO TABS   Oral   Take 2.5 mg by mouth as needed. For wt gain         . NITROGLYCERIN 0.4 MG SL SUBL   Sublingual   Place 0.4 mg under the tongue every 5 (five) minutes as needed. For chest pain.           BP 114/66  Pulse 69  Temp 98.1 F (36.7 C) (Oral)  Resp 19  SpO2 100%  Physical Exam  Constitutional: She is oriented to person, place, and time. She appears well-developed and well-nourished. No  distress.  Eyes: Pupils are equal, round, and reactive to light.  Neck: Normal range of motion. Neck supple. No JVD present. No tracheal deviation present. Thyromegaly present.  Cardiovascular: Normal rate, regular rhythm and normal heart sounds.   Pulmonary/Chest: Effort normal and breath sounds normal.  Abdominal: Soft. Bowel sounds are normal.  Musculoskeletal: Normal range of motion.  Neurological: She is alert and oriented to person, place, and time. No cranial nerve deficit.  Skin: Skin is warm and dry. No rash noted. No erythema.  Psychiatric: She has a normal mood and affect. Her behavior is normal. Judgment and thought content normal.    ED Course  Procedures (including critical care time)  Labs Reviewed - No data to display No results found.   No diagnosis found.   MDM  IMPRESSION  UNCONTROLLED TYPE 2 DM  CARDIOMYOPATHY  CRI  ELEVATED LIVER ENZYMES  RECOMMENDATIONS / PLAN  START LANTUS SOLOSTAR PEN 12 UNITS EVERY EVENING BEFORE BED CHECK BS 4 TIMES PER DAY GLUCOTROL 10 MG BIDAC DC JANUVIA Followup with cardiology as recommended. I asked the patient to please bring her insulin pen and needle supplies to the clinic today and we can show her how to use it correctly.  The nurse also spent some time reviewing insulin pen technique with the patient today.  The patient reports that she understood and felt comfortable with starting insulin at this time.  FOLLOW UP 1 WEEK for recheck  The patient was given clear instructions to go to ER or return to medical center if symptoms don't improve, worsen or new problems develop.  The patient verbalized understanding.  The patient was told to call to get lab results if they haven't heard anything in the next week.           Cleora Fleet, MD 01/27/12 1527

## 2012-01-28 ENCOUNTER — Telehealth (HOSPITAL_COMMUNITY): Payer: Self-pay

## 2012-01-28 NOTE — Telephone Encounter (Signed)
Message copied by Lestine Mount on Wed Jan 28, 2012  6:38 PM ------      Message from: Vassie Moselle      Created: Tue Jan 27, 2012  2:56 PM      Regarding: Labs                   ----- Message -----         From: Lab In [place] Interface         Sent: 01/27/2012  12:19 PM           To: [facility] Follow Up

## 2012-01-28 NOTE — Telephone Encounter (Signed)
Message copied by Lestine Mount on Wed Jan 28, 2012  6:42 PM ------      Message from: Vassie Moselle      Created: Tue Jan 27, 2012  2:56 PM      Regarding: Labs                   ----- Message -----         From: Lab In [address] Interface         Sent: 01/27/2012  12:19 PM           To: [facility] Follow Up

## 2012-02-02 ENCOUNTER — Encounter: Payer: Self-pay | Admitting: Internal Medicine

## 2012-02-02 DIAGNOSIS — I743 Embolism and thrombosis of arteries of the lower extremities: Secondary | ICD-10-CM | POA: Insufficient documentation

## 2012-02-02 DIAGNOSIS — K7460 Unspecified cirrhosis of liver: Secondary | ICD-10-CM | POA: Insufficient documentation

## 2012-02-02 DIAGNOSIS — F1411 Cocaine abuse, in remission: Secondary | ICD-10-CM | POA: Insufficient documentation

## 2012-02-02 DIAGNOSIS — R188 Other ascites: Secondary | ICD-10-CM | POA: Insufficient documentation

## 2012-02-02 DIAGNOSIS — K769 Liver disease, unspecified: Secondary | ICD-10-CM | POA: Insufficient documentation

## 2012-02-02 DIAGNOSIS — E785 Hyperlipidemia, unspecified: Secondary | ICD-10-CM | POA: Insufficient documentation

## 2012-02-03 ENCOUNTER — Emergency Department (HOSPITAL_COMMUNITY)
Admission: EM | Admit: 2012-02-03 | Discharge: 2012-02-03 | Disposition: A | Discharge status: Home / Self Care | Payer: Medicaid Other | Source: Home / Self Care

## 2012-02-03 ENCOUNTER — Encounter (HOSPITAL_COMMUNITY): Payer: Self-pay | Admitting: Emergency Medicine

## 2012-02-03 DIAGNOSIS — I255 Ischemic cardiomyopathy: Secondary | ICD-10-CM

## 2012-02-03 DIAGNOSIS — E1165 Type 2 diabetes mellitus with hyperglycemia: Secondary | ICD-10-CM

## 2012-02-03 DIAGNOSIS — Z09 Encounter for follow-up examination after completed treatment for conditions other than malignant neoplasm: Secondary | ICD-10-CM

## 2012-02-03 MED ORDER — INSULIN GLARGINE 100 UNIT/ML ~~LOC~~ SOLN: 15.0000 [IU] | Freq: Every day | SUBCUTANEOUS | Status: DC

## 2012-02-03 NOTE — ED Provider Notes (Signed)
History     CSN: [id number]  Arrival date & time 02/03/12  1150   First MD Initiated Contact with Patient 02/03/12 1205      Chief Complaint  Patient presents with  . Follow-up  On blood glucose     HPI 56 year old female with multiple comorbidities including cornea artery disease with severe cardiomyopathy with last EF of 10-15% (follows with [facility]) , it to tension who is here for followup on her blood glucose level. She was recently seen in the health service clinic for her elevated blood glucose and was started on Lantus insulin 12 units daily at bedtime. Patient had was advised to keep a log of her blood sugar monitoring which she has brought here over the past one. Her blood sugar has been ranging from 1:30 to 240 for last few days. Her fasting blood sugar in the morning ranges from 130 to 170 occasionally to 200. Patient denies  polyuria or polydipsia and her symptoms are much better after starting the insulin. She denies any tingling or numbness of her extremities. Denies any blurry vision. She is comfortable using the insulin at home. She denies any leg swellings, chest pain, shortness of breath, dyspnea on exertion, abdominal pain, bowel or urinary symptoms. Denies any nausea vomiting, headache, chills, fever, sore throat, cough.   Past Medical History  Diagnosis Date  . Coronary artery disease   . Diabetes mellitus   . Hypertension   . CHF (congestive heart failure)     Systolic.  EF 10-15%  . Polysubstance abuse     history of  (cocaine, tobacco and ETOH)    Past Surgical History  Procedure Date  . Coronary artery bypass graft   . Cardiac catheterization   . Cardiac defibrillator placement     Family History  Problem Relation Age of Onset  . Heart attack Mother     Died age 61  . Diabetes Maternal Grandmother   . Heart attack Cousin   . Heart attack Maternal Uncle     History  Substance Use Topics  . Smoking status: Former Smoker    Quit date:  03/03/2011  . Smokeless tobacco: Not on file  . Alcohol Use: Yes     Comment: Had some drinks New Years, describes social drinkingC    OB History    Grav Para Term Preterm Abortions TAB SAB Ect Mult Living                  Review of Systems Denies headache, blurry vision, eye pain, hearing impairment, ear discharge, sore throat, dry mouth, cough or nasal congestion Denies chest pain, palpitations, shortness of breath, Denies nausea, vomiting, abdominal pain, diarrhea, constipation, dysuria, urinary urgency or frequency. Denies weakness of the extremities, tingling or numbness.  Allergies  Review of patient's allergies indicates no known allergies.  Home Medications   Current Outpatient Rx  Name  Route  Sig  Dispense  Refill  . ALLOPURINOL 100 MG PO TABS   Oral   Take 100 mg by mouth 2 (two) times daily.          . ASPIRIN 81 MG PO CHEW   Oral   Chew 81 mg by mouth daily.         [name] CARVEDILOL 6.25 MG PO TABS   Oral   Take 1 tablet (6.25 mg total) by mouth 2 (two) times daily with a meal.   60 tablet   3   . DIGOXIN 0.125 MG PO TABS  Oral   Take 0.125 mg by mouth daily.         . FUROSEMIDE 40 MG PO TABS   Oral   Take 1 tablet (40 mg total) by mouth 2 (two) times daily.   60 tablet   6   . GLIPIZIDE 10 MG PO TABS   Oral   Take 1 tablet (10 mg total) by mouth 2 (two) times daily before a meal.   60 tablet   1   . INSULIN GLARGINE 100 UNIT/ML [place] SOLN   Subcutaneous   Inject 15 Units into the skin at bedtime. Or as directed by physician   10 mL   5   . INSULIN PEN NEEDLE 31G X 8 MM MISC   Does not apply   1 Device by Does not apply route as directed.   30 each   12   . LISINOPRIL 2.5 MG PO TABS   Oral   Take 5 mg by mouth 2 (two) times daily.         [name] METOLAZONE 2.5 MG PO TABS   Oral   Take 2.5 mg by mouth as needed. For wt gain         . NITROGLYCERIN 0.4 MG SL SUBL   Sublingual   Place 0.4 mg under the tongue every 5 (five) minutes  as needed. For chest pain.         [name] PRAVASTATIN SODIUM 40 MG PO TABS   Oral   Take 1 tablet (40 mg total) by mouth at bedtime.   30 tablet   6   . SPIRONOLACTONE 25 MG PO TABS   Oral   Take 1 tablet (25 mg total) by mouth daily.   30 tablet   5   . TRAMADOL HCL 50 MG PO TABS   Oral   Take 1 tablet (50 mg total) by mouth every 6 (six) hours as needed for pain.   30 tablet   0     BP 94/57  Pulse 71  Temp 97.7 F (36.5 C) (Oral)  Resp 18  SpO2 98%  Physical Exam Middle aged female in no acute distress HEENT: No pallor, moist oral mucosa Chest: Clear to auscultation bilaterally CVS: Normal S1-S2 no murmurs Abdomen: Soft, nontender nondistended Extremities: Warm, no edema CNS: AAO x3 ED Course  Procedures (including critical care time)  Labs Reviewed - No data to display No results found.   1. Uncontrolled diabetes mellitus (followup) Patient was started on Lantus insulin on her recent visit at Health serv. Her hemoglobin A1c is noted to be 9.6. She was started on 12 units at bedtime . During her last visit her blood sugar was in 300s. Patient has brought a log for blood sugar monitoring at home and ranges from 130 to 240. Patient has been on glipizide 10 mg twice a day. Januvia was discontinued on last visit. I will increase her Lantus dose to 15 units at bedtime. I have advised the patient to keep a log of her blood sugar monitoring with meals. Patient has been given reading material are monitoring her blood glucose, diet planning and awareness of hypoglycemic symptoms. Patient has been advised to return to the clinic if her blood glucose is consistently elevated above 200 on current medication regimen or she has low blood glucose with or without hypoglycemic symptoms. Patient will be followed back in the clinic in 3 months and we will have her hemoglobin A1c, lipid panel and comprehensive metabolic panel checked.  2. Ischemic/ nonischemic cardiomyopathy  Patient  follows with [facility] cardiology. She had significantly low EF on recent echo.  currently euvolemic. Continue with aspirin, Coreg, digoxin, Lasix, lisinopril Spironolactone and metolazone. Continue with pravastatin. Her blood pressure is stable at this time. She has followup with her cardiologist in one week.      3. Mild CKD. Recent lab shows creatinine of 1.38. We will reevaluate in 3 months. Also noted for a potassium of 5. Patient is on low dose lisinopril and Aldactone which will continue at this time and followup repeat labs in 3 months.    MDM          Eddie North, MD 02/03/12 [phone]

## 2012-02-03 NOTE — ED Notes (Signed)
Reports follow up on dm

## 2012-02-05 ENCOUNTER — Other Ambulatory Visit (HOSPITAL_COMMUNITY)
Admission: RE | Admit: 2012-02-05 | Discharge: 2012-02-05 | Disposition: A | Discharge status: Home / Self Care | Payer: Medicaid Other | Source: Ambulatory Visit | Attending: Internal Medicine | Admitting: Internal Medicine

## 2012-02-05 ENCOUNTER — Encounter: Payer: Self-pay | Admitting: Internal Medicine

## 2012-02-05 ENCOUNTER — Ambulatory Visit (INDEPENDENT_AMBULATORY_CARE_PROVIDER_SITE_OTHER): Payer: Medicaid Other | Admitting: Internal Medicine

## 2012-02-05 VITALS — BP 87/60 | HR 64 | Temp 97.1°F | Ht 64.0 in | Wt 175.7 lb

## 2012-02-05 DIAGNOSIS — I5022 Chronic systolic (congestive) heart failure: Secondary | ICD-10-CM

## 2012-02-05 DIAGNOSIS — Z113 Encounter for screening for infections with a predominantly sexual mode of transmission: Secondary | ICD-10-CM | POA: Insufficient documentation

## 2012-02-05 DIAGNOSIS — Z0000 Encounter for general adult medical examination without abnormal findings: Secondary | ICD-10-CM | POA: Insufficient documentation

## 2012-02-05 DIAGNOSIS — Z86718 Personal history of other venous thrombosis and embolism: Secondary | ICD-10-CM

## 2012-02-05 DIAGNOSIS — E785 Hyperlipidemia, unspecified: Secondary | ICD-10-CM

## 2012-02-05 DIAGNOSIS — N760 Acute vaginitis: Secondary | ICD-10-CM | POA: Insufficient documentation

## 2012-02-05 DIAGNOSIS — E1165 Type 2 diabetes mellitus with hyperglycemia: Secondary | ICD-10-CM

## 2012-02-05 DIAGNOSIS — N184 Chronic kidney disease, stage 4 (severe): Secondary | ICD-10-CM

## 2012-02-05 DIAGNOSIS — R82998 Other abnormal findings in urine: Secondary | ICD-10-CM

## 2012-02-05 DIAGNOSIS — N898 Other specified noninflammatory disorders of vagina: Secondary | ICD-10-CM

## 2012-02-05 DIAGNOSIS — N949 Unspecified condition associated with female genital organs and menstrual cycle: Secondary | ICD-10-CM

## 2012-02-05 DIAGNOSIS — N390 Urinary tract infection, site not specified: Secondary | ICD-10-CM | POA: Insufficient documentation

## 2012-02-05 DIAGNOSIS — R300 Dysuria: Secondary | ICD-10-CM

## 2012-02-05 DIAGNOSIS — I743 Embolism and thrombosis of arteries of the lower extremities: Secondary | ICD-10-CM

## 2012-02-05 DIAGNOSIS — Z23 Encounter for immunization: Secondary | ICD-10-CM

## 2012-02-05 LAB — GLUCOSE, CAPILLARY: Glucose-Capillary: 111 mg/dL — ABNORMAL HIGH (ref 70–99)

## 2012-02-05 MED ORDER — INSULIN GLARGINE 100 UNIT/ML ~~LOC~~ SOLN: 20.0000 [IU] | Freq: Every day | SUBCUTANEOUS | Status: DC

## 2012-02-05 NOTE — Assessment & Plan Note (Addendum)
Multifactorial including DM, HTN, ICM. Will repeat the CMP today.

## 2012-02-05 NOTE — Assessment & Plan Note (Addendum)
Pelvic exam done and wetprep sent.  Addendum  Wetprep, GC/Chlamydia Negative

## 2012-02-05 NOTE — Patient Instructions (Addendum)
1. Will increase your Lantus to 20 units daily and stop your Glipizide 2. Check your blood sugar 3-4 times daily and call Mamie next week 3. Will send referral to GI for colonoscopy. Patient will ask Dr. Gala Romney as well. 4. Will call you with blood work results.

## 2012-02-05 NOTE — Assessment & Plan Note (Addendum)
Pt is noted to have acute right popliteal-tibial artery embolus in 2009, which was successfully removed surgically. Her hypercoagulable panel was positive at the time. But it was done when she was treated with anticoagulation therapy. And it was not repeated.  - Patient states that this was her only episode of clotting disease, and denies family history. - will check her hypercoagulable panel today, will repeat in 12 weeks if it is positive - If diagnosed, she will need a face to face discussion on risk and benefit of treating vs not treating it with life long coumadin. And we will also need to contact her cardiologist as well.   Addendum Hypercoagulable panel is positive. Will repeat it in Feb/2014

## 2012-02-05 NOTE — Progress Notes (Addendum)
Subjective:   Patient ID: [name] female   DOB: [birth year] 56 y.o.   MRN: [id number]  HPI: [name] is a 56 y.o. 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 She is here to establish care.  1. HTN    Runs low at 80-90's/50's. On Coreg, Lasix, lisinopril, Zaroxolyn, Aldactone per cardiology.   2. DM, uncontrolled  Patient was on Janumet and Glipizide, and her Hb A1C 9.6. She was started on Lantus that was titrated to 15 units QHS. Her Glucometer downloaded and reviewed.       3 Substance abuse,  states that she stopped all substance abuse for one year.   4. Dysuria and vaginal odor   Reports a few days of dysuria, urinary frequency and urgency. Noted some vaginal odor as well. No fever.    5. CAD/ICM/ICD managed by [facility] cardiology  Health maintenance 1. Tetanus shot--today 2. Pneumococcal vaccine--today 3. DM- Foot exam done, will obtain ophthalmology report ( done in Aug, 2013).  4. Colonoscopy--referred 5. Repeat CBC, CMP, Lipid panel, TSH, Free T4, hypercoagulable panel, digoxin level, UDS   Past Medical History  Diagnosis Date  . Coronary artery disease   . Diabetes mellitus   . Hypertension   . CHF (congestive heart failure)     Systolic.  EF 10-15%  . Polysubstance abuse     history of  (cocaine, tobacco and ETOH)   Current Outpatient Prescriptions  Medication Sig Dispense Refill  . allopurinol (ZYLOPRIM) 100 MG tablet Take 100 mg by mouth 2 (two) times daily.       [name] aspirin 81 MG chewable tablet Chew 81 mg by mouth daily.      . carvedilol (COREG) 6.25 MG tablet Take 1 tablet (6.25 mg total) by mouth 2 (two) times daily with a meal.  60 tablet  3  . digoxin (LANOXIN) 0.125 MG tablet Take 0.125 mg by mouth daily.      . furosemide (LASIX) 40 MG tablet Take 1 tablet (40 mg total) by mouth 2 (two) times daily.  60 tablet  6   . glipiZIDE (GLUCOTROL) 10 MG tablet Take 1 tablet (10 mg total) by mouth 2 (two) times daily before a meal.  60 tablet  1  . insulin glargine (LANTUS SOLOSTAR) 100 UNIT/ML injection Inject 15 Units into the skin at bedtime. Or as directed by physician  10 mL  5  . Insulin Pen Needle (B-D ULTRAFINE III SHORT PEN) 31G X 8 MM MISC 1 Device by Does not apply route as directed.  30 each  12  . lisinopril (PRINIVIL,ZESTRIL) 2.5 MG tablet Take 5 mg by mouth 2 (two) times daily.      . metolazone (ZAROXOLYN) 2.5 MG tablet Take 2.5 mg by mouth as needed. For wt gain      . nitroGLYCERIN (NITROSTAT) 0.4 MG SL tablet Place 0.4 mg under the tongue every 5 (five) minutes as needed. For chest pain.      . pravastatin (PRAVACHOL) 40 MG tablet Take 1 tablet (40 mg total) by mouth at bedtime.  30 tablet  6  . spironolactone (ALDACTONE) 25 MG tablet Take 1 tablet (25 mg total) by mouth daily.  30 tablet  5  . traMADol (ULTRAM) 50 MG tablet Take 1 tablet (50 mg total) by mouth every 6 (six) hours as needed for pain.  30 tablet  0  . [  DISCONTINUED] sitaGLIPtin (JANUVIA) 25 MG tablet Take 1 tablet (25 mg total) by mouth daily.  30 tablet  1   Family History  Problem Relation Age of Onset  . Heart attack Mother     Died age 51  . Diabetes Maternal Grandmother   . Heart attack Cousin   . Heart attack Maternal Uncle    History   Social History  . Marital Status: Widowed    Spouse Name: N/A    Number of Children: N/A  . Years of Education: N/A   Occupational History  . disable    Social History Main Topics  . Smoking status: Former Smoker    Quit date: 03/03/2011  . Smokeless tobacco: Not on file  . Alcohol Use: Yes     Comment: Had some drinks New Years, describes social drinkingC  . Drug Use: No     Comment: Has history of cocaine use, denies recent  . Sexually Active: Not on file   Other Topics Concern  . Not on file   Social History Narrative  . No narrative on file   Review of  Systems: Review of Systems:  Constitutional:  Denies fever, chills, diaphoresis, appetite change and fatigue.   HEENT:  Denies congestion, sore throat, rhinorrhea, sneezing, mouth sores, trouble swallowing, neck pain   Respiratory:  Denies SOB, DOE, cough, and wheezing.   Cardiovascular:  Denies palpitations and leg swelling.   Gastrointestinal:  Denies nausea, vomiting, abdominal pain, diarrhea, constipation, blood in stool and abdominal distention.   Genitourinary:  Denies dysuria, urgency, frequency, hematuria, flank pain and difficulty urinating.   Musculoskeletal:  Denies myalgias, back pain, joint swelling, arthralgias and gait problem.   Skin:  Denies pallor, rash and wound.   Neurological:  Denies dizziness, seizures, syncope, weakness, light-headedness, numbness and headaches.    .  Objective:  Physical Exam: There were no vitals filed for this visit. General: alert, well-developed, and cooperative to examination.  Head: normocephalic and atraumatic.  Eyes: vision grossly intact, pupils equal, pupils round, pupils reactive to light, no injection and anicteric.  Mouth: pharynx pink and moist, no erythema, and no exudates.  Neck: supple, full ROM, no thyromegaly, no JVD, and no carotid bruits.  Lungs: normal respiratory effort, no accessory muscle use, normal breath sounds, no crackles, and no wheezes. Heart: normal rate, regular rhythm, no murmur, no gallop, and no rub.  Abdomen: soft, non-tender, normal bowel sounds, no distention, no guarding, no rebound tenderness, no hepatomegaly, and no splenomegaly.  Msk: no joint swelling, no joint warmth, and no redness over joints.  Pulses: 2+ DP/PT pulses bilaterally Extremities: No cyanosis, clubbing, edema Neurologic: alert & oriented X3, cranial nerves II-XII intact, strength normal in all extremities, sensation intact to light touch, and gait normal.  Skin: turgor normal and no rashes.  Psych: Oriented X3, memory intact for recent  and remote, normally interactive, good eye contact, not anxious appearing, and not depressed appearing. GU: normal external genitalia, normal-appearing vaginal vault, whitish yellowish vaginal discharge noted.    Assessment & Plan:    Addendum Patient is called and All lab work is discussed over the phone. Please see below.  Medications changes noted and patient is informed to pick up her Rx at pharmacy. All questions answered,

## 2012-02-05 NOTE — Assessment & Plan Note (Addendum)
Addendum Patient has had symptoms for more than one week. Her uncontrolled DM can certain put her at higher risk for complicated cystitis. UA positive  - Cipro 500 BID x 5 days

## 2012-02-05 NOTE — Assessment & Plan Note (Addendum)
Hb A1C is 9.6 on oral antihyperglycemic agents. Janumet D/C'd due to increased Cr level.  Will D/C Glipizide since unsure of efficacy of med due to GI complication( cirrhosis and acites)of chronic heart failure.  Denies hypoglycemic events.   - Average CBG 245 ( 111-466) - Based on her weight, will increase her basal coverage Lantus to 20 units daily, and will titrate up according to her CBGs. Patient is instructed to call in 3 days with record of her CBGS. - she may also need meal time coverage in near future if basal coverage not able to control her CBGs - DM educator referral - Urine microalbumin, foot exam, eye exam, pneumococcal vaccine done  Addendum Patient called and reported that her blood sugars are better. Highest reading since starting Lantus 20 is 210-220's at dinner time. Lowest CBG is 141. Denies hypoglycemic events.  - will ask patient to increase her Lantus to 24 units starting from tonight.  - she should check her CBGs AC and QHS. She is instructed to decrease her Lantus to 22 units if she has any CBG < 70. - She should see DM educator within one week. I asked patient to call for an appt and also sent a reminding email to the front desk.

## 2012-02-06 LAB — CBC
HCT: 35.2 % — ABNORMAL LOW (ref 36.0–46.0)
MCHC: 33.8 g/dL (ref 30.0–36.0)
MCV: 89.3 fL (ref 78.0–100.0)
Platelets: 223 10*3/uL (ref 150–400)
RDW: 14.7 % (ref 11.5–15.5)
WBC: 8.4 10*3/uL (ref 4.0–10.5)

## 2012-02-06 LAB — URINALYSIS, ROUTINE W REFLEX MICROSCOPIC
Bilirubin Urine: NEGATIVE
Glucose, UA: NEGATIVE mg/dL
Hgb urine dipstick: NEGATIVE
Ketones, ur: NEGATIVE mg/dL
Specific Gravity, Urine: 1.014 (ref 1.005–1.030)
pH: 5 (ref 5.0–8.0)

## 2012-02-06 LAB — URINALYSIS, MICROSCOPIC ONLY: Crystals: NONE SEEN

## 2012-02-06 LAB — COMPLETE METABOLIC PANEL WITH GFR
ALT: 29 U/L (ref 0–35)
Alkaline Phosphatase: 139 U/L — ABNORMAL HIGH (ref 39–117)
CO2: 24 mEq/L (ref 19–32)
Creat: 1.78 mg/dL — ABNORMAL HIGH (ref 0.50–1.10)
GFR, Est African American: 37 mL/min — ABNORMAL LOW
GFR, Est Non African American: 32 mL/min — ABNORMAL LOW
Sodium: 134 mEq/L — ABNORMAL LOW (ref 135–145)
Total Bilirubin: 0.5 mg/dL (ref 0.3–1.2)
Total Protein: 7.8 g/dL (ref 6.0–8.3)

## 2012-02-06 LAB — MICROALBUMIN / CREATININE URINE RATIO
Creatinine, Urine: 238.7 mg/dL
Microalb Creat Ratio: 8.4 mg/g (ref 0.0–30.0)

## 2012-02-06 LAB — LIPID PANEL
HDL: 30 mg/dL — ABNORMAL LOW (ref >39)
Total CHOL/HDL Ratio: 7 Ratio
Triglycerides: 185 mg/dL — ABNORMAL HIGH (ref <150)

## 2012-02-06 LAB — DIGOXIN LEVEL: Digoxin Level: 1.2 ng/mL (ref 0.8–2.0)

## 2012-02-09 ENCOUNTER — Encounter (HOSPITAL_COMMUNITY): Payer: Self-pay

## 2012-02-09 ENCOUNTER — Ambulatory Visit (HOSPITAL_COMMUNITY)
Admission: RE | Admit: 2012-02-09 | Discharge: 2012-02-09 | Disposition: A | Discharge status: Home / Self Care | Payer: Medicaid Other | Source: Ambulatory Visit | Attending: Internal Medicine | Admitting: Internal Medicine

## 2012-02-09 VITALS — BP 92/54 | HR 65 | Wt 174.1 lb

## 2012-02-09 DIAGNOSIS — I5022 Chronic systolic (congestive) heart failure: Secondary | ICD-10-CM | POA: Insufficient documentation

## 2012-02-09 LAB — HYPERCOAGULABLE PANEL, COMPREHENSIVE
AntiThromb III Func: 130 % — ABNORMAL HIGH (ref 76–126)
Anticardiolipin IgG: 6 GPL U/mL (ref <23)
Anticardiolipin IgM: 2 MPL U/mL (ref <11)
Beta-2 Glyco I IgG: 0 G Units (ref <20)
Beta-2-Glycoprotein I IgA: 6 A Units (ref <20)
Beta-2-Glycoprotein I IgM: 3 M Units (ref <20)
DRVVT: 47 secs — ABNORMAL HIGH (ref <42.9)
Lupus Anticoagulant: NOT DETECTED
PTT Lupus Anticoagulant: 45.9 secs — ABNORMAL HIGH (ref 28.0–43.0)
Protein C Activity: 148 % — ABNORMAL HIGH (ref 75–133)
Protein C, Total: 80 % (ref 72–160)
Protein S Total: 114 % (ref 60–150)

## 2012-02-09 LAB — ICD DEVICE OBSERVATION

## 2012-02-09 NOTE — Progress Notes (Signed)
HPI: 56 year old female with history of HTN, DM2, polysubstance abuse (ETOH, tobacco, cocaine) and PAD. 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 At time of cath LAD, LCX and RCA occluded. EF 45%. Had abrupt stent occlusion the next day and had to go back to the lab. Had PCI of LAD and then underwent CABG x 5 with mitral valve annuloplasty.   ECHO 2011 EF 20-25% -> Medtronic ICD placed.  [facility] 03/17/11  RA = 8  RV = 47/1/9  PA = 52/12 (28)  PCW = 20 no significant v-waves  Fick cardiac output/index = 4.2/2.2  PVR =1.6 [place]  FA sat = 95%  PA sat = 61%, 63%  Ao Pressure: 89/58 (71)  LV Pressure: 86/12/24  Severe native CAD with all bypass grafts patent  Echo 03/2011: Severe LV dysfunction EF 10-15%   04/10/11: CPX (on Carvedilol)  Peak VO2: 13.6 ml/kg/min predicted peak VO2: 63.3% VE/VCO2 slope: 44.9 OUES: 1.11Peak RER: 1.12   05/21/11 Lower extremity doppler negative for DVT 11/17/11 ECHO EF 25-30%  She returns for follow up today.  She was scheduled for CRT upgrade but her sugars were uncontrolled (400s) so procedure cancelled.  Started on insulin but sugars remain 160-200, being followed by Dr. Dierdre Searles.  No swelling.  Weight 175 pounds at home, stable.  No extra fluid pills.  Walks around grocery store without difficulty.  Can walk up flight of steps without difficulty.     ROS: All systems negative except as listed in HPI, PMH and Problem List.  Past Medical History  Diagnosis Date  . Coronary artery disease   . Diabetes mellitus   . Hypertension   . CHF (congestive heart failure)     Systolic.  EF 10-15%  . Polysubstance abuse     history of  (cocaine, tobacco and ETOH)    Current Outpatient Prescriptions  Medication Sig Dispense Refill  . allopurinol (ZYLOPRIM) 100 MG tablet Take 100 mg by mouth 2 (two) times daily.       [name] aspirin 81 MG chewable tablet Chew 81 mg by mouth daily.      . carvedilol (COREG) 6.25 MG tablet Take 1  tablet (6.25 mg total) by mouth 2 (two) times daily with a meal.  60 tablet  3  . digoxin (LANOXIN) 0.125 MG tablet Take 0.125 mg by mouth daily.      . furosemide (LASIX) 40 MG tablet Take 1 tablet (40 mg total) by mouth 2 (two) times daily.  60 tablet  6  . insulin glargine (LANTUS SOLOSTAR) 100 UNIT/ML injection Inject 20 Units into the skin at bedtime. Or as directed by physician  10 mL  5  . Insulin Pen Needle (B-D ULTRAFINE III SHORT PEN) 31G X 8 MM MISC 1 Device by Does not apply route as directed.  30 each  12  . lisinopril (PRINIVIL,ZESTRIL) 2.5 MG tablet Take 5 mg by mouth 2 (two) times daily.      . nitroGLYCERIN (NITROSTAT) 0.4 MG SL tablet Place 0.4 mg under the tongue every 5 (five) minutes as needed. For chest pain.      . pravastatin (PRAVACHOL) 40 MG tablet Take 1 tablet (40 mg total) by mouth at bedtime.  30 tablet  6  . spironolactone (ALDACTONE) 25 MG tablet Take 1 tablet (25 mg total) by mouth daily.  30 tablet  5  . traMADol (ULTRAM) 50 MG tablet Take 1 tablet (50 mg total)  by mouth every 6 (six) hours as needed for pain.  30 tablet  0  . metolazone (ZAROXOLYN) 2.5 MG tablet Take 2.5 mg by mouth as needed. For wt gain      . [DISCONTINUED] sitaGLIPtin (JANUVIA) 25 MG tablet Take 1 tablet (25 mg total) by mouth daily.  30 tablet  1     PHYSICAL EXAM: Filed Vitals:   02/09/12 0929  BP: 92/54  Pulse: 65  Weight: 174 lb 1.9 oz (78.98 kg)  SpO2: 98%  Repeat SBP 86  General:  Well appearing. No resp difficulty HEENT: normal Neck: supple. JVP flat. Carotids 2+ bilaterally; no bruits. No lymphadenopathy or thryomegaly appreciated. Cor: PMI normal. Regular rate & rhythm. No rubs, gallops or murmurs. Lungs: clear Abdomen: soft, nontender, nondistended. No hepatosplenomegaly. No bruits or masses. Good bowel sounds. Extremities: no cyanosis, clubbing, rash, edema Neuro: alert & orientedx3, cranial nerves grossly intact. Moves all 4 extremities w/o difficulty. Affect  pleasant.       ASSESSMENT & PLAN:

## 2012-02-09 NOTE — Patient Instructions (Addendum)
Cut back lasix 40 mg daily.  May take extra lasix for fluid   Diet class Tuesday 12/17 at 2p.   Follow up 1 month.

## 2012-02-09 NOTE — Assessment & Plan Note (Signed)
Functionally improved. By CPX still high risk with low BP. Creatinine up slightly. Will try to cut lasix back to once per day and have her take afternoon dose as needed. Reinforced need for daily weights and reviewed use of sliding scale diuretics. Will contact EP about rescheduling CRT upgrade. I suspect she will need advanced therapies in the near future.

## 2012-02-10 ENCOUNTER — Other Ambulatory Visit: Payer: Self-pay | Admitting: Internal Medicine

## 2012-02-10 MED ORDER — INSULIN GLARGINE 100 UNIT/ML ~~LOC~~ SOLN: 24.0000 [IU] | Freq: Every day | SUBCUTANEOUS | Status: DC

## 2012-02-10 MED ORDER — ATORVASTATIN CALCIUM 80 MG PO TABS: 80.0000 mg | ORAL_TABLET | Freq: Every day | ORAL | Status: DC

## 2012-02-10 MED ORDER — ATORVASTATIN CALCIUM 40 MG PO TABS: 40.0000 mg | ORAL_TABLET | Freq: Every day | ORAL | Status: DC

## 2012-02-10 MED ORDER — CIPROFLOXACIN HCL 500 MG PO TABS: 500.0000 mg | ORAL_TABLET | Freq: Two times a day (BID) | ORAL | Status: AC

## 2012-02-10 NOTE — Assessment & Plan Note (Signed)
Addendum LDL 142. Been on Pravastatin 40 daily since August 2013.  - will stop Pravastatin - change it to Lipitor 40 daily

## 2012-02-10 NOTE — Addendum Note (Signed)
Addended byDierdre Searles, Allysa Governale on: 02/10/2012 01:49 PM   Modules accepted: Orders

## 2012-02-11 ENCOUNTER — Telehealth: Payer: Self-pay | Admitting: Dietician

## 2012-02-12 ENCOUNTER — Ambulatory Visit (INDEPENDENT_AMBULATORY_CARE_PROVIDER_SITE_OTHER): Payer: Medicaid Other | Admitting: Dietician

## 2012-02-12 ENCOUNTER — Encounter: Payer: Self-pay | Admitting: Dietician

## 2012-02-12 VITALS — BP 87/55 | HR 73

## 2012-02-12 DIAGNOSIS — E1165 Type 2 diabetes mellitus with hyperglycemia: Secondary | ICD-10-CM

## 2012-02-12 NOTE — Progress Notes (Signed)
Diabetes Self-Management Training (DSMT)  Initial Visit  02/12/2012 [name], identified by name and date of birth, is a 56 y.o. female with Type 2 Diabetes. Patient presented to clinic with new onset dizziness, eyes reportedly seeing changes in light brightness/flashing this morning. Recently had lasix decreased. Blood sugar checked 270, blood pressure checked 87/55. Discussed with triage nurse who recommended patient call heart failure clinic as soon as possible. Patient and triage nurse felt she was fine to drive herself home. Year of diabetes:   ASSESSMENT Patient concerns are Nutrition/meal planning, Healthy Lifestyle and Glycemic control.  Blood pressure 87/55, pulse 73. There is no height or weight on file to calculate BMI. Lab Results  Component Value Date   LDLCALC 142* 02/05/2012   Lab Results  Component Value Date   HGBA1C 9.6* 01/27/2012    Family history of diabetes: Not immediate- grandmother Support systems: children Special needs: None Prior DM Education: Yes Patients belief/attitude about diabetes: Diabetes can be controlled. Self foot exams daily: Not assesses today Diabetes Complications: Nephropathy & cardiovascular disease   Medications See Medications list.  Needs skills/knowledge review    Exercise Plan Doing ADLs .   Self-Monitoring  Monitor: accu chek aviva Frequency of testing: 3 times/day Breakfast: 111-166 Lunch: 147-264 Supper: 162-266 Bedtime: 158-287 Hyperglycemia: Yes Weekly Hypoglycemia: No   Meal Planning Some knowledge   Assessment comments: patient able to do math needed for label reading, eats out most of the time, seems willing to make change needed to better control her blood sugar fluctuations   INDIVIDUAL DIABETES EDUCATION PLAN:  Nutrition management Medication Monitoring Acute complication: _______________________________________________________________________  Intervention TOPICS COVERED TODAY:   Nutrition management  Role of diet in the treatment of diabetes and the relationship between the three main macronutritents and blood glucose control. Food label reading, portion sizes and measuring food. Reviewed blood glucose goals for pre and post meals and how to evaluate the patients' food intake on their blood glucose level. Meal options for control of blood glucose level and chronic complications. Medication  Reviewed patients medication for diabetes, action, purpose, timing of dose and side effects. Monitoring  Taught/discussed recording of test results and interpretation of SMBG.  PATIENTS GOALS/PLAN (copy and paste in patient instructions so patient receives a copy): 1.  Learning Objective:       State importance of carb consistency 2.  Behavioral Objective:         Nutrition: To improve blood glucose control I will follow meal plan of 3-4 servings 45-60 grams carb per meal Sometimes 25%  Personalized Follow-Up Plan for Ongoing Self Management Support:  Doctor's Office, friends, family and CDE visits ______________________________________________________________________   Outcomes Expected outcomes: Demonstrated interest in learning.Expect positive changes in lifestyle. Self-care Barriers: None Education material provided: yes Patient to contact team via Phone if problems or questions. Time in: 1430     Time out: 1530 Future DSMT - 2 wks   Plyler, Lupita Leash

## 2012-02-13 ENCOUNTER — Other Ambulatory Visit: Payer: Self-pay | Admitting: *Deleted

## 2012-02-13 MED ORDER — TRAMADOL HCL 50 MG PO TABS: 50.0000 mg | ORAL_TABLET | Freq: Four times a day (QID) | ORAL | Status: DC | PRN

## 2012-02-16 ENCOUNTER — Ambulatory Visit: Payer: Medicaid Other | Admitting: Dietician

## 2012-02-16 NOTE — Telephone Encounter (Signed)
Opened in error

## 2012-03-04 ENCOUNTER — Encounter: Payer: Self-pay | Admitting: Internal Medicine

## 2012-03-04 ENCOUNTER — Encounter: Payer: Medicaid Other | Admitting: Dietician

## 2012-03-09 ENCOUNTER — Ambulatory Visit (HOSPITAL_COMMUNITY)
Admission: RE | Admit: 2012-03-09 | Discharge: 2012-03-09 | Disposition: A | Discharge status: Home / Self Care | Payer: Medicare Other | Source: Ambulatory Visit | Attending: Internal Medicine | Admitting: Internal Medicine

## 2012-03-09 VITALS — BP 90/58 | HR 78 | Wt 171.0 lb

## 2012-03-09 DIAGNOSIS — I5022 Chronic systolic (congestive) heart failure: Secondary | ICD-10-CM | POA: Insufficient documentation

## 2012-03-09 NOTE — Patient Instructions (Addendum)
Follow up in 2 months  Do the following things EVERYDAY: 1) Weigh yourself in the morning before breakfast. Write it down and keep it in a log. 2) Take your medicines as prescribed 3) Eat low salt foods-Limit salt (sodium) to 2000 mg per day.  4) Stay as active as you can everyday 5) Limit all fluids for the day to less than 2 liters 

## 2012-03-09 NOTE — Assessment & Plan Note (Addendum)
NYHA II. Volume status stable. Unable to titrate HF meds due to low blood pressure. Plan for CRT upgrade once blood sugar better controlled. Follow up in 2 months.

## 2012-03-09 NOTE — Progress Notes (Signed)
Patient ID: [name], female   DOB: 1955-08-17, 57 y.o.   MRN: [id number] HPI: 57 year old female with history of HTN, DM2, polysubstance abuse (ETOH, tobacco, cocaine) and PAD. 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 At time of cath LAD, LCX and RCA occluded. EF 45%. Had abrupt stent occlusion the next day and had to go back to the lab. Had PCI of LAD and then underwent CABG x 5 with mitral valve annuloplasty.   ECHO 2011 EF 20-25% -> Medtronic ICD placed.  RHC [facility] 03/17/11  RA = 8  RV = 47/1/9  PA = 52/12 (28)  PCW = 20 no significant v-waves  Fick cardiac output/index = 4.2/2.2  PVR =1.6 Woods  FA sat = 95%  PA sat = 61%, 63%  Ao Pressure: 89/58 (71)  LV Pressure: 86/12/24  Severe native CAD with all bypass grafts patent  Echo 03/2011: Severe LV dysfunction EF 10-15%   04/10/11: CPX (on Carvedilol)  Peak VO2: 13.6 ml/kg/min predicted peak VO2: 63.3% VE/VCO2 slope: 44.9 OUES: 1.11Peak RER: 1.12   05/21/11 Lower extremity doppler negative for DVT 11/17/11 ECHO EF 25-30%  She returns for follow up today.  Denies SOB/PND/Orthopnea/CP. Plan for CRT upgrade once blood sugar better controlled. Blood sugar at home 135-168.  Weight at home 170 pounds. She has not had any extra lasix. Compliant with medications.   ROS: All systems negative except as listed in HPI, PMH and Problem List.  Past Medical History  Diagnosis Date  . Coronary artery disease   . Diabetes mellitus   . Hypertension   . CHF (congestive heart failure)     Systolic.  EF 10-15%  . Polysubstance abuse     history of  (cocaine, tobacco and ETOH)    Current Outpatient Prescriptions  Medication Sig Dispense Refill  . allopurinol (ZYLOPRIM) 100 MG tablet Take 100 mg by mouth 2 (two) times daily.       [name] aspirin 81 MG chewable tablet Chew 81 mg by mouth daily.      [name] atorvastatin (LIPITOR) 40 MG tablet Take 1 tablet (40 mg total) by mouth daily.  30 tablet  11  .  carvedilol (COREG) 6.25 MG tablet Take 1 tablet (6.25 mg total) by mouth 2 (two) times daily with a meal.  60 tablet  3  . digoxin (LANOXIN) 0.125 MG tablet Take 0.125 mg by mouth daily.      . furosemide (LASIX) 40 MG tablet Take 40 mg by mouth daily.      . insulin glargine (LANTUS SOLOSTAR) 100 UNIT/ML injection Inject 24 Units into the skin at bedtime. Or as directed by physician  10 mL  5  . Insulin Pen Needle (B-D ULTRAFINE III SHORT PEN) 31G X 8 MM MISC 1 Device by Does not apply route as directed.  30 each  12  . lisinopril (PRINIVIL,ZESTRIL) 2.5 MG tablet Take 5 mg by mouth 2 (two) times daily.      [name] spironolactone (ALDACTONE) 25 MG tablet Take 1 tablet (25 mg total) by mouth daily.  30 tablet  5  . traMADol (ULTRAM) 50 MG tablet Take 1 tablet (50 mg total) by mouth every 6 (six) hours as needed for pain.  90 tablet  0  . metolazone (ZAROXOLYN) 2.5 MG tablet Take 2.5 mg by mouth as needed. For wt gain      . nitroGLYCERIN (NITROSTAT) 0.4 MG SL tablet Place 0.4 mg under  the tongue every 5 (five) minutes as needed. For chest pain.      . [DISCONTINUED] sitaGLIPtin (JANUVIA) 25 MG tablet Take 1 tablet (25 mg total) by mouth daily.  30 tablet  1     PHYSICAL EXAM: Filed Vitals:   03/09/12 0931  BP: 90/58  Pulse: 78  Weight: 171 lb (77.565 kg)  SpO2: 97%    General:  Well appearing. No resp difficulty HEENT: normal Neck: supple. JVP flat. Carotids 2+ bilaterally; no bruits. No lymphadenopathy or thryomegaly appreciated. Cor: PMI normal. Regular rate & rhythm. No rubs, gallops or murmurs. Lungs: clear Abdomen: soft, nontender, nondistended. No hepatosplenomegaly. No bruits or masses. Good bowel sounds. Extremities: no cyanosis, clubbing, rash, edema Neuro: alert & orientedx3, cranial nerves grossly intact. Moves all 4 extremities w/o difficulty. Affect pleasant.       ASSESSMENT & PLAN:

## 2012-03-09 NOTE — Assessment & Plan Note (Signed)
No evidence of ischemia. Continue current regimen.   

## 2012-03-10 ENCOUNTER — Ambulatory Visit (HOSPITAL_COMMUNITY): Admission: RE | Admit: 2012-03-10 | Payer: Medicaid Other | Source: Ambulatory Visit

## 2012-03-10 NOTE — Addendum Note (Signed)
Addended by: Neomia Dear on: 03/10/2012 06:40 PM   Modules accepted: Orders

## 2012-03-12 ENCOUNTER — Telehealth: Payer: Self-pay | Admitting: Dietician

## 2012-03-12 NOTE — Telephone Encounter (Signed)
Follow up to see how blood sugars are doing and schedule follow up. Patient reports her blood sugars are fine now: 110-120 sometimes 140 and she is checking 4x a day. Schedule a follow up for DSMT for next Tuesday at 10:30 am.

## 2012-03-16 ENCOUNTER — Ambulatory Visit (INDEPENDENT_AMBULATORY_CARE_PROVIDER_SITE_OTHER): Payer: Medicare Other | Admitting: Dietician

## 2012-03-16 VITALS — Ht 64.0 in | Wt 169.8 lb

## 2012-03-16 DIAGNOSIS — E1165 Type 2 diabetes mellitus with hyperglycemia: Secondary | ICD-10-CM

## 2012-03-16 NOTE — Patient Instructions (Addendum)
Please make a follow up appointment for 4 weeks.  1- Please try to notice which blood sugars are highest by highlighting or circling those that are > 140. For example- today you noticed that your before bed are highest and that maybe dinner was more things that increased your blood sugar.  2- Bring a list of places where you eat out.   3- Suggest signing in to [facility] today or tomorrow.  We'll talk about that at next visit

## 2012-03-16 NOTE — Progress Notes (Signed)
Diabetes Self-Management Training (DSMT)  2nd visit Visit  03/16/2012 [name], identified by name and date of birth, is a 57 y.o. female with Type 2 Diabetes.  Dx date:need to assess at future visit     ASSESSMENT Patient concerns are Nutrition/meal planning, Healthy Lifestyle and Glycemic control.  Height 5\' 4"  (1.626 m), weight 169 lb 12.8 oz (77.021 kg).- weight decreased ~ 2 #.  Body mass index is 29.15 kg/(m^2). Lab Results  Component Value Date   LDLCALC 142* 02/05/2012    HGBA1C 9.6* 01/27/2012   Self foot exams daily: Not assessed today   Medications See Medications list.  Needs skills/knowledge review   Exercise Plan Doing ADLs .   Self-Monitoring  Monitor: accu chek aviva Frequency of testing: 3-4 times/day Breakfast: 90-150 Lunch: 120-160s Supper: 120-160s Bedtime: 120-200 Hyperglycemia: Yes Weekly Hypoglycemia: No   Meal Planning Some knowledge- improving greatly   Assessment comments: patient very determined to take care of herself  INDIVIDUAL DIABETES EDUCATION PLAN:  Nutrition management Medication Monitoring Acute complication: _______________________________________________________________________  Intervention TOPICS COVERED TODAY:  Nutrition- review of carb containing foods, Food label reading, portion sizes. Monitoring- Reviewed blood glucose goals for pre and post meals and pattern managment. Medication  Reviewed patients medication for diabetes, action, purpose, timing of dose and side effects.  PATIENTS GOALS/PLAN (copy and paste in patient instructions so patient receives a copy): 1.  Learning Objective:       State importance of carb consistency- achieved 2.  Behavioral Objective:         Nutrition: To improve blood glucose control I will follow meal plan of 3-4 servings 45-60 grams carb per meal Sometimes 90%            Bring list of places you eat out to to next visit: 0%    Personalized Follow-Up Plan for Ongoing Self  Management Support:  Doctor's Office, friends, family and CDE visits ______________________________________________________________________   Outcomes Expected outcomes: Demonstrated interest in learning.Expect positive changes in lifestyle. Self-care Barriers: None Education material provided: yes- information on A1C and mychart Patient to contact team via Phone if problems or questions. Time in: 1030     Time out: 1130 Future DSMT - 4 wks   Fields, Robin Leash

## 2012-03-29 ENCOUNTER — Other Ambulatory Visit (HOSPITAL_COMMUNITY): Payer: Self-pay | Admitting: *Deleted

## 2012-03-29 ENCOUNTER — Other Ambulatory Visit: Payer: Self-pay | Admitting: *Deleted

## 2012-03-29 ENCOUNTER — Ambulatory Visit (INDEPENDENT_AMBULATORY_CARE_PROVIDER_SITE_OTHER): Payer: Medicare Other | Admitting: *Deleted

## 2012-03-29 DIAGNOSIS — Z95810 Presence of automatic (implantable) cardiac defibrillator: Secondary | ICD-10-CM

## 2012-03-29 DIAGNOSIS — I255 Ischemic cardiomyopathy: Secondary | ICD-10-CM

## 2012-03-29 DIAGNOSIS — I2589 Other forms of chronic ischemic heart disease: Secondary | ICD-10-CM

## 2012-03-29 MED ORDER — CARVEDILOL 6.25 MG PO TABS: 6.2500 mg | ORAL_TABLET | Freq: Two times a day (BID) | ORAL | Status: DC

## 2012-03-29 NOTE — Telephone Encounter (Signed)
Pt still having foot pain but tramadol is helping

## 2012-03-31 LAB — REMOTE ICD DEVICE
CHARGE TIME: 9.759 s
DEV-0020ICD: NEGATIVE
FVT: 0
TOT-0001: 3
TOT-0002: 0
TZAT-0001SLOWVT: 1
TZAT-0012SLOWVT: 200 ms
TZAT-0018FASTVT: NEGATIVE
TZAT-0018SLOWVT: NEGATIVE
TZAT-0019FASTVT: 8 V
TZAT-0020FASTVT: 1.5 ms
TZAT-0020SLOWVT: 1.5 ms
TZON-0003SLOWVT: 360 ms
TZON-0003VSLOWVT: 370 ms
TZON-0004SLOWVT: 32
TZST-0001FASTVT: 2
TZST-0001FASTVT: 6
TZST-0001SLOWVT: 2
TZST-0001SLOWVT: 4
TZST-0001SLOWVT: 6
TZST-0002FASTVT: NEGATIVE
TZST-0002FASTVT: NEGATIVE
TZST-0002SLOWVT: NEGATIVE
VENTRICULAR PACING ICD: 0 pct
VF: 0

## 2012-04-01 NOTE — Telephone Encounter (Signed)
Pt informed

## 2012-04-06 ENCOUNTER — Encounter: Payer: Self-pay | Admitting: *Deleted

## 2012-04-09 ENCOUNTER — Encounter: Payer: Self-pay | Admitting: Internal Medicine

## 2012-04-15 ENCOUNTER — Encounter: Payer: Medicaid Other | Admitting: Internal Medicine

## 2012-04-20 ENCOUNTER — Telehealth (HOSPITAL_COMMUNITY): Payer: Self-pay | Admitting: Adult Health

## 2012-04-20 MED ORDER — LISINOPRIL 5 MG PO TABS: 5.0000 mg | ORAL_TABLET | Freq: Two times a day (BID) | ORAL | Status: DC

## 2012-04-20 NOTE — Telephone Encounter (Signed)
Asked for a refill of Lisinopril. Refill sent to [facility].   Robin Boen NP-C 8:42 AM

## 2012-04-22 ENCOUNTER — Other Ambulatory Visit: Payer: Self-pay | Admitting: *Deleted

## 2012-04-22 MED ORDER — GLUCOSE BLOOD VI STRP: ORAL_STRIP | Status: DC

## 2012-04-22 NOTE — Telephone Encounter (Signed)
Pt states she checks 4 times a day.

## 2012-04-23 ENCOUNTER — Other Ambulatory Visit: Payer: Self-pay | Admitting: *Deleted

## 2012-04-23 NOTE — Telephone Encounter (Signed)
Rx needs dx code; direct instructions and how many times she test per day.   Re-check above information Thanks

## 2012-04-25 MED ORDER — GLUCOSE BLOOD VI STRP: ORAL_STRIP | Status: DC

## 2012-05-10 ENCOUNTER — Ambulatory Visit (HOSPITAL_COMMUNITY)
Admission: RE | Admit: 2012-05-10 | Discharge: 2012-05-10 | Disposition: A | Discharge status: Home / Self Care | Payer: Medicare Other | Source: Ambulatory Visit | Attending: Internal Medicine | Admitting: Internal Medicine

## 2012-05-10 VITALS — BP 96/44 | HR 75 | Wt 178.5 lb

## 2012-05-10 DIAGNOSIS — M109 Gout, unspecified: Secondary | ICD-10-CM

## 2012-05-10 DIAGNOSIS — I5022 Chronic systolic (congestive) heart failure: Secondary | ICD-10-CM

## 2012-05-10 LAB — BASIC METABOLIC PANEL
BUN: 31 mg/dL — ABNORMAL HIGH (ref 6–23)
CO2: 27 mEq/L (ref 19–32)
Chloride: 103 mEq/L (ref 96–112)
Glucose, Bld: 245 mg/dL — ABNORMAL HIGH (ref 70–99)
Potassium: 4.1 mEq/L (ref 3.5–5.1)

## 2012-05-10 MED ORDER — FUROSEMIDE 40 MG PO TABS: 40.0000 mg | ORAL_TABLET | Freq: Two times a day (BID) | ORAL | Status: DC

## 2012-05-10 MED ORDER — PREDNISONE 20 MG PO TABS: 40.0000 mg | ORAL_TABLET | Freq: Every day | ORAL | Status: AC

## 2012-05-10 NOTE — Patient Instructions (Signed)
Increase Furosemide to 40 mg Twice daily   Start Prednisone 40 mg (2 tabs) daily for 3 days  Your physician has requested that you have an echocardiogram. Echocardiography is a painless test that uses sound waves to create images of your heart. It provides your doctor with information about the size and shape of your heart and how well your heart's chambers and valves are working. This procedure takes approximately one hour. There are no restrictions for this procedure.  Your physician recommends that you schedule a follow-up appointment in: 2 weeks

## 2012-05-10 NOTE — Addendum Note (Signed)
Encounter addended by: Noralee Space, RN on: 05/10/2012 10:02 AM<BR>     Documentation filed: Patient Instructions Section, Orders

## 2012-05-10 NOTE — Addendum Note (Signed)
Encounter addended by: Noralee Space, RN on: 05/10/2012 10:06 AM<BR>     Documentation filed: Orders

## 2012-05-10 NOTE — Assessment & Plan Note (Addendum)
NYHA III now with volume overload after lasix cut back due to rising creatinine. Will increase lasix back to 40 bid. Check labs today. She will start to weigh herself again every day and call the office immediately if her weight is not coming down. If renal function continues to get worse will need repeat RHC to assess for low output and need for inotropes as her CPX shows advanced HF. Will repeat echo. Needs referral back to EP for consideration of CRT upgrade once volume status improves. BP too low titrate other meds at this point. Very reluctant about LVAD but would consider if "it is the only option." See back in 2 weeks. Check Optivol.

## 2012-05-10 NOTE — Progress Notes (Signed)
Patient ID: [name], female   DOB: [birth year], 57 y.o.   MRN: [id number] HPI: 57 year old female with history of HTN, DM2, polysubstance abuse (ETOH, tobacco, cocaine) and PAD. 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 At time of cath LAD, LCX and RCA occluded. EF 45%. Had abrupt stent occlusion the next day and had to go back to the lab. Had PCI of LAD and then underwent CABG x 5 with mitral valve annuloplasty.   ECHO 2011 EF 20-25% -> Medtronic ICD placed.  RHC [facility] 03/17/11  RA = 8  RV = 47/1/9  PA = 52/12 (28)  PCW = 20 no significant v-waves  Fick cardiac output/index = 4.2/2.2  PVR =1.6 Woods  FA sat = 95%  PA sat = 61%, 63%  Ao Pressure: 89/58 (71)  LV Pressure: 86/12/24  Severe native CAD with all bypass grafts patent  Echo 03/2011: Severe LV dysfunction EF 10-15%   04/10/11: CPX (on Carvedilol)  Peak VO2: 13.6 ml/kg/min predicted peak VO2: 63.3% VE/VCO2 slope: 44.9 OUES: 1.11Peak RER: 1.12   11/17/11 ECHO EF 25-30%  She returns for follow up today. At last visit lasix decreased to 40 qd due to rise in creatinine. Weight now up 10 pounds. [facility] but now [facility]. No orthopnea or PND. No ICD firing. Stopped weighing regularly. CRT upgrade has been deferred due to high blood sugars. Compliant with medications.   + gout in R 1st toe  ROS: All systems negative except as listed in HPI, PMH and Problem List.  Past Medical History  Diagnosis Date  . Coronary artery disease   . Diabetes mellitus   . Hypertension   . CHF (congestive heart failure)     Systolic.  EF 10-15%  . Polysubstance abuse     history of  (cocaine, tobacco and ETOH)    Current Outpatient Prescriptions  Medication Sig Dispense Refill  . allopurinol (ZYLOPRIM) 100 MG tablet Take 100 mg by mouth 2 (two) times daily.       [name] aspirin 81 MG chewable tablet Chew 81 mg by mouth daily.      [name] atorvastatin (LIPITOR) 40 MG tablet Take 1 tablet  (40 mg total) by mouth daily.  30 tablet  11  . carvedilol (COREG) 6.25 MG tablet Take 1 tablet (6.25 mg total) by mouth 2 (two) times daily with a meal.  60 tablet  3  . digoxin (LANOXIN) 0.125 MG tablet Take 0.125 mg by mouth daily.      . furosemide (LASIX) 40 MG tablet Take 40 mg by mouth daily.      [name] glucose blood (ACCU-CHEK INSTANT PLUS TEST) test strip Use to test blood sugars 4 times a day. Insulin dependent. Dx code; 250.02.  120 each  12  . insulin glargine (LANTUS SOLOSTAR) 100 UNIT/ML injection Inject 24 Units into the skin at bedtime. Or as directed by physician  10 mL  5  . Insulin Pen Needle (B-D ULTRAFINE III SHORT PEN) 31G X 8 MM MISC 1 Device by Does not apply route as directed.  30 each  12  . lisinopril (PRINIVIL,ZESTRIL) 5 MG tablet Take 1 tablet (5 mg total) by mouth 2 (two) times daily.  60 tablet  3  . metolazone (ZAROXOLYN) 2.5 MG tablet Take 2.5 mg by mouth as needed. For wt gain      . nitroGLYCERIN (NITROSTAT) 0.4 MG SL tablet Place  0.4 mg under the tongue every 5 (five) minutes as needed. For chest pain.      [name] spironolactone (ALDACTONE) 25 MG tablet Take 1 tablet (25 mg total) by mouth daily.  30 tablet  5  . traMADol (ULTRAM) 50 MG tablet Take 1 tablet (50 mg total) by mouth every 6 (six) hours as needed for pain.  90 tablet  0  . [DISCONTINUED] sitaGLIPtin (JANUVIA) 25 MG tablet Take 1 tablet (25 mg total) by mouth daily.  30 tablet  1   No current facility-administered medications for this encounter.     PHYSICAL EXAM: Filed Vitals:   05/10/12 0914  BP: 96/44  Pulse: 75  Weight: 178 lb 8 oz (80.967 kg)  SpO2: 99%    General:  Well appearing. No resp difficulty HEENT: normal Neck: supple. JVP 8-9 Carotids 2+ bilaterally; no bruits. No lymphadenopathy or thryomegaly appreciated. Cor: PMI normal. Regular rate & rhythm. 2/6 TR Lungs: clear Abdomen: soft, nontender, mildly distended. No hepatosplenomegaly. No bruits or masses. Good bowel  sounds. Extremities: no cyanosis, clubbing, rash, tr-1+ edema R great toe erythema/warm Neuro: alert & orientedx3, cranial nerves grossly intact. Moves all 4 extremities w/o difficulty. Affect pleasant.   ASSESSMENT & PLAN:

## 2012-05-10 NOTE — Assessment & Plan Note (Signed)
Has gout flare in R 1st toe. Prednisone 40 x 3 days. Continue allopurinol.

## 2012-05-13 ENCOUNTER — Encounter: Payer: Self-pay | Admitting: Internal Medicine

## 2012-05-13 ENCOUNTER — Ambulatory Visit (INDEPENDENT_AMBULATORY_CARE_PROVIDER_SITE_OTHER): Payer: Medicare Other | Admitting: Internal Medicine

## 2012-05-13 VITALS — BP 98/59 | HR 57 | Temp 97.0°F | Ht 64.0 in | Wt 177.6 lb

## 2012-05-13 DIAGNOSIS — I10 Essential (primary) hypertension: Secondary | ICD-10-CM

## 2012-05-13 DIAGNOSIS — E1165 Type 2 diabetes mellitus with hyperglycemia: Secondary | ICD-10-CM

## 2012-05-13 DIAGNOSIS — I5022 Chronic systolic (congestive) heart failure: Secondary | ICD-10-CM

## 2012-05-13 DIAGNOSIS — E1159 Type 2 diabetes mellitus with other circulatory complications: Secondary | ICD-10-CM | POA: Insufficient documentation

## 2012-05-13 DIAGNOSIS — M109 Gout, unspecified: Secondary | ICD-10-CM

## 2012-05-13 NOTE — Patient Instructions (Addendum)
General Instructions:  1. Please increase your Lantus to 26 units tonight and check your blood sugars for next 3 days. Increase lantus to 28 units on next Monday 3/17 if you do not have hypoglycemic events and your bloo dsugars are > 75. Then increase your Lantus to 30 units on 05/21/12 on Friday if you you do not have hypoglycemic events and your blood sugars are > 75.  2. Call me with your blood sugars on next Thursday.  3. Come to the clinic if you have gout flareup.   4. Follow me in 1 month.   Treatment Goals:  Goals (1 Years of Data) as of 05/13/12         As of Today 05/10/12 03/09/12 02/12/12 02/09/12     Blood Pressure    . Blood Pressure < 140/90  98/59 96/44 90/58  87/55 92/54     Result Component    . HEMOGLOBIN A1C < 7.0  7.8        . LDL CALC < 100            Progress Toward Treatment Goals:  Treatment Goal 05/13/2012  Hemoglobin A1C improved    Self Care Goals & Plans:  Self Care Goal 05/13/2012  Manage my medications take my medicines as prescribed; bring my medications to every visit; follow the sick day instructions if I am sick; refill my medications on time  Monitor my health keep track of my blood glucose; bring my glucose meter and log to each visit; keep track of my blood pressure; keep track of my weight; bring my blood pressure log to each visit; check my feet daily  Eat healthy foods drink diet soda or water instead of juice or soda; eat baked foods instead of fried foods; eat more vegetables; eat foods that are low in salt; eat fruit for snacks and desserts; eat smaller portions  Be physically active find an activity I enjoy    Home Blood Glucose Monitoring 05/13/2012  Check my blood sugar 4 times a day  When to check my blood sugar before breakfast; before lunch; before dinner; at bedtime     Care Management & Community Referrals:  Referral 05/13/2012  Referrals made for care management support diabetes educator  Referrals made to community resources  exercise/physical therapy; nutrition

## 2012-05-13 NOTE — Assessment & Plan Note (Signed)
BP Readings from Last 3 Encounters:  05/13/12 98/59  05/10/12 96/44  03/09/12 90/58    Lab Results  Component Value Date   NA 140 05/10/2012   K 4.1 05/10/2012   CREATININE 1.15* 05/10/2012    Assessment:  Blood pressure control:    Progress toward BP goal:     Comments:   Plan:  Medications:  continue current medications  Educational resources provided:    Self management tools provided:    Other plans:

## 2012-05-13 NOTE — Progress Notes (Signed)
Subjective:   Patient ID: [name] female   DOB: August 05, 1955 57 y.o.   MRN: [id number]  HPI: [name] is a 57 y.o. 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 She is here to establish care.  1. HTN    Runs low at 90's/50's. On Coreg, Lasix, lisinopril, Zaroxolyn, Aldactone per cardiology.   2. DM, uncontrolled  Patient was on Janumet and Glipizide, and her Hb A1C 9.6 last Dec. She was started on Lantus that was titrated to 24 units QHS. Her Glucometer downloaded and reviewed. Denies hypoglycemic events. Lowest CBG at 100's highest at 200-300's (fasting).     3.   Recent gout flareup, resolving Treated with prednisone by Dr. Gala Romney  4.  Substance abuse,  states that she stopped all substance abuse for one year.  .  5. CAD/CHF/ICM/ICD managed by [facility] cardiology  Health maintenance 1. ophthalmology patient will schedule an appt herself.  2. Colonoscopy--declined by cardiologist Dr. Gala Romney    Past Medical History  Diagnosis Date  . Coronary artery disease   . Diabetes mellitus   . Hypertension   . CHF (congestive heart failure)     Systolic.  EF 10-15%  . Polysubstance abuse     history of  (cocaine, tobacco and ETOH)   Current Outpatient Prescriptions  Medication Sig Dispense Refill  . allopurinol (ZYLOPRIM) 100 MG tablet Take 100 mg by mouth 2 (two) times daily.       [name] aspirin 81 MG chewable tablet Chew 81 mg by mouth daily.      [name] atorvastatin (LIPITOR) 40 MG tablet Take 1 tablet (40 mg total) by mouth daily.  30 tablet  11  . carvedilol (COREG) 6.25 MG tablet Take 1 tablet (6.25 mg total) by mouth 2 (two) times daily with a meal.  60 tablet  3  . digoxin (LANOXIN) 0.125 MG tablet Take 0.125 mg by mouth daily.      . furosemide (LASIX) 40 MG tablet Take 1 tablet (40 mg total) by mouth 2 (two) times daily.  30 tablet    .  glucose blood (ACCU-CHEK INSTANT PLUS TEST) test strip Use to test blood sugars 4 times a day. Insulin dependent. Dx code; 250.02.  120 each  12  . insulin glargine (LANTUS SOLOSTAR) 100 UNIT/ML injection Inject 24 Units into the skin at bedtime. Or as directed by physician  10 mL  5  . Insulin Pen Needle (B-D ULTRAFINE III SHORT PEN) 31G X 8 MM MISC 1 Device by Does not apply route as directed.  30 each  12  . lisinopril (PRINIVIL,ZESTRIL) 5 MG tablet Take 1 tablet (5 mg total) by mouth 2 (two) times daily.  60 tablet  3  . metolazone (ZAROXOLYN) 2.5 MG tablet Take 2.5 mg by mouth as needed. For wt gain      . nitroGLYCERIN (NITROSTAT) 0.4 MG SL tablet Place 0.4 mg under the tongue every 5 (five) minutes as needed. For chest pain.      [name] spironolactone (ALDACTONE) 25 MG tablet Take 1 tablet (25 mg total) by mouth daily.  30 tablet  5  . traMADol (ULTRAM) 50 MG tablet Take 1 tablet (50 mg total) by mouth every 6 (six) hours as needed for pain.  90 tablet  0  . predniSONE (DELTASONE) 20 MG tablet Take 2 tablets (40 mg total) by mouth daily.  6  tablet  0  . [DISCONTINUED] sitaGLIPtin (JANUVIA) 25 MG tablet Take 1 tablet (25 mg total) by mouth daily.  30 tablet  1   No current facility-administered medications for this visit.   Family History  Problem Relation Age of Onset  . Heart attack Mother     Died age 28  . Diabetes Maternal Grandmother   . Heart attack Cousin   . Heart attack Maternal Uncle    History   Social History  . Marital Status: Widowed    Spouse Name: N/A    Number of Children: N/A  . Years of Education: N/A   Occupational History  . disable    Social History Main Topics  . Smoking status: Former Smoker    Quit date: 03/03/2011  . Smokeless tobacco: None  . Alcohol Use: Yes     Comment: Had some drinks New Years, describes social drinkingC  . Drug Use: No     Comment: Has history of cocaine use, denies recent  . Sexually Active: None   Other Topics Concern  .  None   Social History Narrative  . None   Review of Systems: Review of Systems:  Constitutional:  Denies fever, chills, diaphoresis, appetite change and fatigue.   HEENT:  Denies congestion, sore throat, rhinorrhea, sneezing, mouth sores, trouble swallowing, neck pain   Respiratory:  Denies SOB, DOE, cough, and wheezing.   Cardiovascular:  Denies palpitations and leg swelling.   Gastrointestinal:  Denies nausea, vomiting, abdominal pain, diarrhea, constipation, blood in stool and abdominal distention.   Genitourinary:  Denies dysuria, urgency, frequency, hematuria, flank pain and difficulty urinating.   Musculoskeletal:  Denies myalgias, back pain, joint swelling, arthralgias and gait problem.   Skin:  Denies pallor, rash and wound.   Neurological:  Denies dizziness, seizures, syncope, weakness, light-headedness, numbness and headaches.    .  Objective:  Physical Exam: Filed Vitals:   05/13/12 1512  BP: 98/59  Pulse: 57  Temp: 97 F (36.1 C)  TempSrc: Oral  Height: 5\' 4"  (1.626 m)  Weight: 177 lb 9.6 oz (80.559 kg)  SpO2: 99%   General: alert, well-developed, and cooperative to examination.  Head: normocephalic and atraumatic.  Eyes: vision grossly intact, pupils equal, pupils round, pupils reactive to light, no injection and anicteric.  Mouth: pharynx pink and moist, no erythema, and no exudates.  Neck: supple, full ROM, no thyromegaly, no JVD, and no carotid bruits.  Lungs: normal respiratory effort, no accessory muscle use, normal breath sounds, no crackles, and no wheezes. Heart: normal rate, regular rhythm, no murmur, no gallop, and no rub.  Abdomen: soft, non-tender, normal bowel sounds, no distention, no guarding, no rebound tenderness, no hepatomegaly, and no splenomegaly.  Msk: no joint swelling, no joint warmth, and no redness over joints.  Pulses: 2+ DP/PT pulses bilaterally Extremities: No cyanosis, clubbing, edema Neurologic: alert & oriented X3, cranial nerves  II-XII intact, strength normal in all extremities, sensation intact to light touch, and gait normal.  Skin: turgor normal and no rashes.  Psych: Oriented X3, memory intact for recent and remote, normally interactive, good eye contact, not anxious appearing, and not depressed appearing.    Assessment & Plan:

## 2012-05-13 NOTE — Assessment & Plan Note (Signed)
Stable, Managed by Dr. Gala Romney. - continue to follow up with Cardiology.

## 2012-05-13 NOTE — Assessment & Plan Note (Addendum)
Right 1st MTP gout resolving after the treatment with prednisone by Dr. Gala Romney.  - instructed to call the clinic if recurrent gout flareups.  - will check uric acid

## 2012-05-13 NOTE — Assessment & Plan Note (Addendum)
Lab Results  Component Value Date   HGBA1C 7.8 05/13/2012   HGBA1C 9.6* 01/27/2012   HGBA1C 6.1* 03/16/2011     Assessment:  Diabetes control: fair control  Progress toward A1C goal:  improved  Comments:   Plan:  Medications:  continue current medications  Home glucose monitoring:   Frequency: 4 times a day   Timing: before breakfast;before lunch;before dinner;at bedtime  Instruction/counseling given: reminded to get eye exam, reminded to bring blood glucose meter & log to each visit, reminded to bring medications to each visit, discussed foot care, discussed the need for weight loss, discussed diet, discussed sick day management and provided printed educational material  Educational resources provided: handout  Self management tools provided: copy of home glucose meter download;home glucose logbook;instructions for home glucose monitoring;home glucose testing supplies;home glucose meter;other (see comments)  Other plans:   1Please increase your Lantus to 26 units tonight and check your blood sugars for next 3 days. Increase lantus to 28 units on next Monday 3/17 if you do not have hypoglycemic events and your blood sugars are > 75. Then increase your Lantus to 30 units on 05/21/12 on Friday if you do not have hypoglycemic events and your blood sugars are > 75. Call me next week.

## 2012-05-17 ENCOUNTER — Other Ambulatory Visit (HOSPITAL_COMMUNITY): Payer: Self-pay | Admitting: Cardiology

## 2012-05-17 DIAGNOSIS — I5022 Chronic systolic (congestive) heart failure: Secondary | ICD-10-CM

## 2012-05-17 MED ORDER — DIGOXIN 125 MCG PO TABS: 0.1250 mg | ORAL_TABLET | Freq: Every day | ORAL | Status: DC

## 2012-05-17 MED ORDER — CARVEDILOL 6.25 MG PO TABS: 6.2500 mg | ORAL_TABLET | Freq: Two times a day (BID) | ORAL | Status: DC

## 2012-05-17 MED ORDER — SPIRONOLACTONE 25 MG PO TABS: 25.0000 mg | ORAL_TABLET | Freq: Every day | ORAL | Status: DC

## 2012-05-18 ENCOUNTER — Other Ambulatory Visit (INDEPENDENT_AMBULATORY_CARE_PROVIDER_SITE_OTHER): Payer: Medicare Other

## 2012-05-18 DIAGNOSIS — M109 Gout, unspecified: Secondary | ICD-10-CM

## 2012-05-18 LAB — URIC ACID: Uric Acid, Serum: 7.5 mg/dL — ABNORMAL HIGH (ref 2.4–7.0)

## 2012-05-19 ENCOUNTER — Ambulatory Visit (HOSPITAL_COMMUNITY)
Admission: RE | Admit: 2012-05-19 | Discharge: 2012-05-19 | Disposition: A | Discharge status: Home / Self Care | Payer: Medicare Other | Source: Ambulatory Visit | Attending: Internal Medicine | Admitting: Internal Medicine

## 2012-05-19 DIAGNOSIS — I059 Rheumatic mitral valve disease, unspecified: Secondary | ICD-10-CM

## 2012-05-19 DIAGNOSIS — I5022 Chronic systolic (congestive) heart failure: Secondary | ICD-10-CM | POA: Insufficient documentation

## 2012-05-19 DIAGNOSIS — I079 Rheumatic tricuspid valve disease, unspecified: Secondary | ICD-10-CM | POA: Insufficient documentation

## 2012-05-19 DIAGNOSIS — I509 Heart failure, unspecified: Secondary | ICD-10-CM | POA: Insufficient documentation

## 2012-05-19 NOTE — Progress Notes (Signed)
  Echocardiogram 2D Echocardiogram has been performed.  Cathie Beams 05/19/2012, 11:42 AM

## 2012-05-20 ENCOUNTER — Other Ambulatory Visit: Payer: Self-pay | Admitting: Internal Medicine

## 2012-05-20 ENCOUNTER — Telehealth: Payer: Self-pay | Admitting: *Deleted

## 2012-05-20 NOTE — Telephone Encounter (Signed)
  INTERNAL MEDICINE RESIDENCY PROGRAM After-Hours Telephone Call    Reason for call:   I placed an outgoing call to [name] at 542 pm regarding her DM control. Blood sugars as requested:  05/14/2012- 0630= 144, 1200= 245, 2042= 210, 2326= 234  05/15/2012- 0800= 182, 1211= 151, 1832= 124, 2343= 205  05/16/2012- 0749= 161, 1219= 191, 1834= 140, 2221= 168  05/17/2012- 0800= 153, 1631= 167, 1917= 226, 2301= 241  05/18/2012- 0748= 168, 1256= 130, 1603= 167, 2143= 148  05/19/2012- 0707= 170, 1230= 116, 1817= 133, 2143= 148  05/20/2012- 0647= 176, 1150= 242, 1632= 135  She increased her Lantus to 28 units on 05/17/12. No hypoglycemic events.     Assessment/ Plan:  I instructed her to increase her Lantus to 30 units on Friday 05/21/12. Then increase her Lantus to 32 units on 05/25/12 if she does not have hypoglycemic events and her blood sugars are > 75. Call me next Friday.    Patient is instructed to call me or go to ED if she has hypoglycemic events or CBG < 75    Gwendy Boeder, MD   05/20/2012, 5:41 PM

## 2012-05-20 NOTE — Telephone Encounter (Signed)
Blood sugars as requested: 05/14/2012- 0630= 144, 1200= 245, 2042= 210, 2326= 234  05/15/2012- 0800= 182, 1211= 151, 1832= 124, 2343= 205  05/16/2012- 0749= 161, 1219= 191, 1834= 140, 2221= 168  05/17/2012- 0800= 153, 1631= 167, 1917= 226, 2301= 241  05/18/2012- 0748= 168, 1256= 130, 1603= 167, 2143= 148  05/19/2012- 0707= 170, 1230= 116, 1817= 133, 2143= 148  05/20/2012- 0647= 176, 1150= 242, 1632= 135

## 2012-05-24 ENCOUNTER — Ambulatory Visit (HOSPITAL_COMMUNITY)
Admission: RE | Admit: 2012-05-24 | Discharge: 2012-05-24 | Disposition: A | Discharge status: Home / Self Care | Payer: Medicare Other | Source: Ambulatory Visit | Attending: Internal Medicine | Admitting: Internal Medicine

## 2012-05-24 VITALS — BP 96/54 | HR 60 | Wt 177.5 lb

## 2012-05-24 DIAGNOSIS — I10 Essential (primary) hypertension: Secondary | ICD-10-CM | POA: Insufficient documentation

## 2012-05-24 DIAGNOSIS — E119 Type 2 diabetes mellitus without complications: Secondary | ICD-10-CM | POA: Insufficient documentation

## 2012-05-24 DIAGNOSIS — I5022 Chronic systolic (congestive) heart failure: Secondary | ICD-10-CM | POA: Insufficient documentation

## 2012-05-24 DIAGNOSIS — I509 Heart failure, unspecified: Secondary | ICD-10-CM | POA: Insufficient documentation

## 2012-05-24 NOTE — Patient Instructions (Addendum)
Follow up 1 month.  Do the following things EVERYDAY: 1) Weigh yourself in the morning before breakfast. Write it down and keep it in a log. 2) Take your medicines as prescribed 3) Eat low salt foods-Limit salt (sodium) to 2000 mg per day.  4) Stay as active as you can everyday 5) Limit all fluids for the day to less than 2 liters  

## 2012-05-24 NOTE — Assessment & Plan Note (Addendum)
Sh continue to be tenuous with low BP and stable NYHA III-IIIb symptoms . Reviewed and discussed ECHO and CPX results. EF down to 15% from previous 25-30%. She is reluctant to pursue advanced therapies (OHTx or LVAD) at this point, Given LBBB. Will refer back to Dr Graciela Husbands to discuss CRT upgrade. We talked about responder rate. Unable to titrate HF due to soft BP. Will follow closely. Follow up in 1 month.

## 2012-05-24 NOTE — Assessment & Plan Note (Signed)
No evidence of ischemia. Continue current regimen.   

## 2012-05-25 NOTE — Progress Notes (Signed)
Patient ID: [name], female   DOB: Feb 16, 1956, 57 y.o.   MRN: [id number] HPI: 57 year old female with history of HTN, DM2, polysubstance abuse (ETOH, tobacco, cocaine) and PAD. 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 At time of cath LAD, LCX and RCA occluded. EF 45%. Had abrupt stent occlusion the next day and had to go back to the lab. Had PCI of LAD and then underwent CABG x 5 with mitral valve annuloplasty.   ECHO 2011 EF 20-25% -> Medtronic ICD placed.  [facility] 03/17/11  RA = 8  RV = 47/1/9  PA = 52/12 (28)  PCW = 20 no significant v-waves  Fick cardiac output/index = 4.2/2.2  PVR =1.6 [place]  FA sat = 95%  PA sat = 61%, 63%  Ao Pressure: 89/58 (71)  LV Pressure: 86/12/24  Severe native CAD with all bypass grafts patent  Echo 03/2011: Severe LV dysfunction EF 10-15%   04/10/11: CPX (on Carvedilol)  Peak VO2: 13.6 ml/kg/min predicted peak VO2: 63.3% VE/VCO2 slope: 44.9 OUES: 1.11Peak RER: 1.12   11/17/11 ECHO EF 25-30% 05/19/2012 ECHO EF 15%  05/10/12 Creatinine 1.15 Potassium 4.1  She returns for follow up today. At last visit lasix 40 mg twice a day. Also received prednisone for gout pain. Weight at home 173-177 pounds. Over the last 2 weeks she has slight dyspnea when completing activities around the house. Able to ambulate up steps. Denies PND/Orthopnea. Compliant with medications. No alcohol or tobacco.  SBP remains in the 80-90 range.  ROS: All systems negative except as listed in HPI, PMH and Problem List.  Past Medical History  Diagnosis Date  . Coronary artery disease   . Diabetes mellitus   . Hypertension   . CHF (congestive heart failure)     Systolic.  EF 10-15%  . Polysubstance abuse     history of  (cocaine, tobacco and ETOH)    Current Outpatient Prescriptions  Medication Sig Dispense Refill  . allopurinol (ZYLOPRIM) 100 MG tablet Take 100 mg by mouth 2 (two) times daily.       [name] aspirin 81 MG chewable  tablet Chew 81 mg by mouth daily.      [name] atorvastatin (LIPITOR) 40 MG tablet Take 1 tablet (40 mg total) by mouth daily.  30 tablet  11  . carvedilol (COREG) 6.25 MG tablet Take 1 tablet (6.25 mg total) by mouth 2 (two) times daily with a meal.  60 tablet  3  . digoxin (LANOXIN) 0.125 MG tablet Take 1 tablet (0.125 mg total) by mouth daily.  30 tablet  3  . furosemide (LASIX) 40 MG tablet Take 1 tablet (40 mg total) by mouth 2 (two) times daily.  30 tablet    . glucose blood (ACCU-CHEK INSTANT PLUS TEST) test strip Use to test blood sugars 4 times a day. Insulin dependent. Dx code; 250.02.  120 each  12  . insulin glargine (LANTUS) 100 UNIT/ML injection Inject 30 Units into the skin at bedtime. Or as directed by physician      . Insulin Pen Needle (B-D ULTRAFINE III SHORT PEN) 31G X 8 MM MISC 1 Device by Does not apply route as directed.  30 each  12  . lisinopril (PRINIVIL,ZESTRIL) 5 MG tablet Take 1 tablet (5 mg total) by mouth 2 (two) times daily.  60 tablet  3  . metolazone (ZAROXOLYN) 2.5 MG tablet Take 2.5 mg by mouth as needed. For  wt gain      . nitroGLYCERIN (NITROSTAT) 0.4 MG SL tablet Place 0.4 mg under the tongue every 5 (five) minutes as needed. For chest pain.      [name] spironolactone (ALDACTONE) 25 MG tablet Take 1 tablet (25 mg total) by mouth daily.  30 tablet  5  . traMADol (ULTRAM) 50 MG tablet Take 1 tablet (50 mg total) by mouth every 6 (six) hours as needed for pain.  90 tablet  0  . [DISCONTINUED] sitaGLIPtin (JANUVIA) 25 MG tablet Take 1 tablet (25 mg total) by mouth daily.  30 tablet  1   No current facility-administered medications for this encounter.     PHYSICAL EXAM: Filed Vitals:   05/24/12 1505  BP: 96/54  Pulse: 60  Weight: 177 lb 8 oz (80.513 kg)  SpO2: 98%    General:  Well appearing. No resp difficulty HEENT: normal Neck: supple. JVP 8-9 Carotids 2+ bilaterally; no bruits. No lymphadenopathy or thryomegaly appreciated. Cor: PMI normal. Regular rate &  rhythm. 2/6 TR Lungs: clear Abdomen: soft, nontender, mildly distended. No hepatosplenomegaly. No bruits or masses. Good bowel sounds. Extremities: no cyanosis, clubbing, rash, tr-1+ edema R great toe erythema/warm Neuro: alert & orientedx3, cranial nerves grossly intact. Moves all 4 extremities w/o difficulty. Affect pleasant.   ASSESSMENT & PLAN:

## 2012-05-26 ENCOUNTER — Telehealth: Payer: Self-pay | Admitting: *Deleted

## 2012-05-26 NOTE — Telephone Encounter (Signed)
Pt called to report blood sugars: 3/20  [phone] 3/21  [phone] 3/22  211,257,113,147 3/23  [phone] 2/24  [phone] 3/25  144,209,147,200 3/26  151,333  Pt is taking lantus 32 units at night.

## 2012-05-26 NOTE — Telephone Encounter (Signed)
  INTERNAL MEDICINE RESIDENCY PROGRAM After-Hours Telephone Call    Reason for call:   I placed an outgoing call to [name] at 120 pm regarding her DM control Pt called to report blood sugars:  3/20 [phone]  3/21 [phone]  3/22 211,257,113,147  3/23 [phone]  2/24 [phone]  3/25 144,209,147,200  3/26 151,333  Pt increased her lantus 32 units on 05/25/12. No hypoglycemia events.       Assessment/ Plan:   Her fasting CBGs at breakfasts and dinners are better controlled. Still has consistent high CBGS at lunch and bedtime Plan - will ask her to increase her Lantus to 34 units on 05/28/12 and 36 units on 05/31/12 if she does not have hypoglycemic events and her blood sugars are > 75. Call me next Thursday.  I think that she will benefit from an appt with Lupita Leash and needs to have discussion on DM diet ( CHO control on her breakfasts and dinners)  I think that she will eventually needs to have meal time coverage insulin Her TDD is 40   Meal time coverage should be 500/40 ~=12. So one unit for every 12 CHO. CBGs coverage should be 1800/40= 45. So one unit = 40 mg/dl reduction.     I will continue to work with her for her basal coverage for now. Hopefully her fasting CBGs will be better once she is on sufficient Lantus and after discussion with donna about her CHO control.  Patient is instructed to call me or go to ED if she has hypoglycemic events or CBG < 75      [name] Dierdre Searles, MD   05/26/2012, 12:01 PM

## 2012-06-02 ENCOUNTER — Ambulatory Visit (INDEPENDENT_AMBULATORY_CARE_PROVIDER_SITE_OTHER): Payer: Medicare Other | Admitting: Internal Medicine

## 2012-06-02 ENCOUNTER — Encounter: Payer: Self-pay | Admitting: Internal Medicine

## 2012-06-02 VITALS — BP 102/50 | HR 62 | Wt 175.0 lb

## 2012-06-02 DIAGNOSIS — I4891 Unspecified atrial fibrillation: Secondary | ICD-10-CM

## 2012-06-02 DIAGNOSIS — I5022 Chronic systolic (congestive) heart failure: Secondary | ICD-10-CM

## 2012-06-02 DIAGNOSIS — I255 Ischemic cardiomyopathy: Secondary | ICD-10-CM

## 2012-06-02 DIAGNOSIS — I2589 Other forms of chronic ischemic heart disease: Secondary | ICD-10-CM

## 2012-06-02 DIAGNOSIS — Z95810 Presence of automatic (implantable) cardiac defibrillator: Secondary | ICD-10-CM

## 2012-06-02 LAB — ICD DEVICE OBSERVATION
DEV-0020ICD: NEGATIVE
FVT: 0
PACEART VT: 0
TOT-0001: 3
TOT-0002: 0
TZAT-0001SLOWVT: 1
TZAT-0002FASTVT: NEGATIVE
TZAT-0002SLOWVT: NEGATIVE
TZAT-0012SLOWVT: 200 ms
TZAT-0018FASTVT: NEGATIVE
TZAT-0018SLOWVT: NEGATIVE
TZAT-0019FASTVT: 8 V
TZST-0001FASTVT: 5
TZST-0001FASTVT: 6
TZST-0001SLOWVT: 4
TZST-0001SLOWVT: 6
TZST-0002FASTVT: NEGATIVE
TZST-0002FASTVT: NEGATIVE
TZST-0002FASTVT: NEGATIVE
TZST-0002SLOWVT: NEGATIVE
TZST-0002SLOWVT: NEGATIVE
VENTRICULAR PACING ICD: 0 pct

## 2012-06-02 MED ORDER — NITROGLYCERIN 0.4 MG SL SUBL: 0.4000 mg | SUBLINGUAL_TABLET | SUBLINGUAL | Status: DC | PRN

## 2012-06-02 NOTE — Assessment & Plan Note (Signed)
There is discrepancy about her functional status. I discussed this with Dr. Edwyna Ready undertake a formal cardiopulmonary stress test to objectively measure

## 2012-06-02 NOTE — Patient Instructions (Addendum)
Remote monitoring is used to monitor your Pacemaker of ICD from home. This monitoring reduces the number of office visits required to check your device to one time per year. It allows [place] to keep an eye on the functioning of your device to ensure it is working properly. You are scheduled for a device check from home on 09/06/12. You may send your transmission at any time that day. If you have a wireless device, the transmission will be sent automatically. After your physician reviews your transmission, you will receive a postcard with your next transmission date.    Your physician recommends that you schedule a follow-up appointment in: by phone

## 2012-06-02 NOTE — Progress Notes (Signed)
Patient Care Team: Dede Query, MD as PCP - General (Internal Medicine) Duke Salvia, MD (Cardiology) Dolores Patty, MD (Cardiology)   HPI  [name] is a 57 y.o. female  Seen in followup for ICD implanted in 2011  She has a history of ischemic and nonischemic heart disease. She is status post CABG and mitral valve annuloplasty.  Catheterization 2013-January demonstrated patent grafts cardiac index 2.2 ejection fraction 10-15%  Electrocardiogram April 2013 demonstrated a QRS duration of 124 ms repeat today is 126.   She got taken off of her diuretics and had an aggravation of her heart failure. Currently she is doing quite well. She is working at [employer] for an hour day. Ejection fraction was down a little bit by echo apparently.  Is currently some discordance between her assessment of her symptoms and [facility] clinic. She is disinclined to proceed with intervention in this extensively necessary     Past Medical History  Diagnosis Date  . Coronary artery disease   . Diabetes mellitus   . Hypertension   . CHF (congestive heart failure)     Systolic.  EF 10-15%  . Polysubstance abuse     history of  (cocaine, tobacco and ETOH)    Past Surgical History  Procedure Laterality Date  . Coronary artery bypass graft    . Cardiac catheterization    . Cardiac defibrillator placement      2011, Followed By Dr. Graciela Husbands    Current Outpatient Prescriptions  Medication Sig Dispense Refill  . allopurinol (ZYLOPRIM) 100 MG tablet Take 100 mg by mouth 2 (two) times daily.       [name] aspirin 81 MG chewable tablet Chew 81 mg by mouth daily.      [name] atorvastatin (LIPITOR) 40 MG tablet Take 1 tablet (40 mg total) by mouth daily.  30 tablet  11  . carvedilol (COREG) 6.25 MG tablet Take 1 tablet (6.25 mg total) by mouth 2 (two) times daily with a meal.  60 tablet  3  . digoxin (LANOXIN) 0.125 MG tablet Take 1 tablet (0.125 mg total) by mouth daily.  30 tablet  3  . furosemide (LASIX) 40 MG  tablet Take 1 tablet (40 mg total) by mouth 2 (two) times daily.  30 tablet    . glucose blood (ACCU-CHEK INSTANT PLUS TEST) test strip Use to test blood sugars 4 times a day. Insulin dependent. Dx code; 250.02.  120 each  12  . insulin glargine (LANTUS) 100 UNIT/ML injection Inject 30 Units into the skin at bedtime. Or as directed by physician      . Insulin Pen Needle (B-D ULTRAFINE III SHORT PEN) 31G X 8 MM MISC 1 Device by Does not apply route as directed.  30 each  12  . lisinopril (PRINIVIL,ZESTRIL) 5 MG tablet Take 1 tablet (5 mg total) by mouth 2 (two) times daily.  60 tablet  3  . nitroGLYCERIN (NITROSTAT) 0.4 MG SL tablet Place 0.4 mg under the tongue every 5 (five) minutes as needed. For chest pain.      [name] spironolactone (ALDACTONE) 25 MG tablet Take 1 tablet (25 mg total) by mouth daily.  30 tablet  5  . [DISCONTINUED] sitaGLIPtin (JANUVIA) 25 MG tablet Take 1 tablet (25 mg total) by mouth daily.  30 tablet  1   No current facility-administered medications for this visit.    No Known Allergies  Review of Systems negative except from HPI and PMH  Physical Exam BP  102/50  Pulse 62  Wt 175 lb (79.379 kg)  BMI 30.02 kg/m2 Well developed and well nourished in no acute distress HENT normal E scleral and icterus clear Neck Supple JVP flat; carotids brisk and full Clear to ausculation  1Regular rate and rhythm,  Soft with active bowel sounds No clubbing cyanosis none Edema Alert and oriented, grossly normal motor and sensory function Skin Warm and Dry    Assessment and  Plan

## 2012-06-02 NOTE — Assessment & Plan Note (Signed)
Continue current medications. 

## 2012-06-02 NOTE — Assessment & Plan Note (Signed)
The patient's device was interrogated.  The information was reviewed. No changes were made in the programming.   We will await results of a cardiopulmonary stress test before proceeding with consideration of CRT upgrade

## 2012-06-04 ENCOUNTER — Other Ambulatory Visit: Payer: Self-pay | Admitting: Internal Medicine

## 2012-06-04 ENCOUNTER — Ambulatory Visit (INDEPENDENT_AMBULATORY_CARE_PROVIDER_SITE_OTHER): Payer: Medicare Other | Admitting: Dietician

## 2012-06-04 VITALS — Ht 64.0 in | Wt 175.0 lb

## 2012-06-04 DIAGNOSIS — E1165 Type 2 diabetes mellitus with hyperglycemia: Secondary | ICD-10-CM

## 2012-06-04 NOTE — Patient Instructions (Addendum)
You are doing wonderful- lost weight AND lowered blood sugar!!!  We talked about eating 3-4(45-60 grams each meal)  servings of carbs at each meal.   Also walking 15 minutes after each meal will help lower blood sugar.   You want your blood sugar to be 80-140 before most meals.   Let's follow up in 4 weeks.

## 2012-06-04 NOTE — Progress Notes (Signed)
Diabetes Self-Management Training (DSMT)  3nd visit Visit  06/04/2012 [name], identified by name and date of birth, is a 57 y.o. female with Type 2 Diabetes.     ASSESSMENT Patient concerns are Nutrition/meal planning, Healthy Lifestyle and Glycemic control.  Height 5\' 4"  (1.626 m), weight 175 lb (79.379 kg).- weight decreased ~ 2 #.  Body mass index is 30.02 kg/(m^2). Lab Results  Component Value Date   LDLCALC 142* 02/05/2012    HGBA1C 9.6* 01/27/2012   Self foot exams daily: Not assessed today    Medications See Medications list.  Needs skills/knowledge review   Exercise Plan Doing ADLs .and now starting to go to [facility] a few days a week. Has dog she is thinking about walking more often.   Self-Monitoring  Monitor: accu chek aviva- downloaded x 30 days Frequency of testing: 3-4 times/day Breakfast: average 182 Lunch: average- 217 Supper: 142-159 Bedtime: 228 Hyperglycemia: Yes Weekly Hypoglycemia: No   Meal Planning Some knowledge- improving greatly  Assessment comments: patient very determined to take care of herself, having blood sugar spike after breakfast and dinner when less active. Busy yesterday and all CBGs were in target range< 130  INDIVIDUAL DIABETES EDUCATION PLAN:  Nutrition management Medication Monitoring Acute complication: _______________________________________________________________________  Intervention TOPICS COVERED TODAY:  Nutrition- review of carb containing foods, Food label reading, portion sizes, menus to assist with cooking and carb consistency Monitoring- Reviewed blood glucose goals for before meals and pattern managment. Acute complications- discuss choices to lower blood sugars once elevated and avoid lows  PATIENTS GOALS/PLAN (copy and paste in patient instructions so patient receives a copy): 1.  Learning Objective:       State importance of exercise and frequency 2.  Behavioral Objective:         Exercise: will try  to take short walk or other activty after breakfast and dinner most days. 25% of tim   Personalized Follow-Up Plan for Ongoing Self Management Support:  Doctor's Office, friends, family and CDE visits ______________________________________________________________________   Outcomes Expected outcomes: Demonstrated interest in learning.Expect positive changes in lifestyle. Self-care Barriers: None Education material provided: yes- information on A1C and mychart Patient to contact team via Phone if problems or questions. Time in: 930     Time out: 1030 Future DSMT - 4 wks   [name], [name]

## 2012-06-09 ENCOUNTER — Telehealth: Payer: Self-pay | Admitting: *Deleted

## 2012-06-09 NOTE — Telephone Encounter (Signed)
Pt would like for you to call her asap at [phone]

## 2012-06-09 NOTE — Telephone Encounter (Signed)
I called patient and think that she may have recurrent gout attack. Will plan to see her tomorrow afternoon.

## 2012-06-10 ENCOUNTER — Ambulatory Visit (INDEPENDENT_AMBULATORY_CARE_PROVIDER_SITE_OTHER): Payer: Medicare Other | Admitting: Internal Medicine

## 2012-06-10 ENCOUNTER — Encounter: Payer: Self-pay | Admitting: Internal Medicine

## 2012-06-10 VITALS — BP 95/66 | HR 57 | Temp 97.8°F | Ht 60.0 in | Wt 175.5 lb

## 2012-06-10 DIAGNOSIS — M109 Gout, unspecified: Secondary | ICD-10-CM

## 2012-06-10 MED ORDER — COLCHICINE 0.6 MG PO TABS: 0.6000 mg | ORAL_TABLET | Freq: Two times a day (BID) | ORAL | Status: DC

## 2012-06-10 NOTE — Progress Notes (Signed)
Subjective:   Patient ID: [name] female   DOB: 02/17/1956 57 y.o.   MRN: [id number]  HPI: [name] is a 57 y.o. 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 She is here for gout evaluation.   1. Gout Patient has history of gout which usually causes great toe pain.  She was noted to have acute gout attack last month on right first MTP joint, which was treated by oral prednisone prescribed by Dr. Gala Romney. She called me and told me that her gout is not completely resolved.  She still c/o right great toe redness,  pain and swelling.   Review of Systems:  Constitutional:  Denies fever, chills, diaphoresis, appetite change and fatigue.   HEENT:  Denies congestion, sore throat, rhinorrhea, sneezing, mouth sores, trouble swallowing, neck pain   Respiratory:  Denies SOB, DOE, cough, and wheezing.   Cardiovascular:  Denies palpitations and leg swelling.   Gastrointestinal:  Denies nausea, vomiting, abdominal pain, diarrhea, constipation, blood in stool and abdominal distention.   Genitourinary:  Denies dysuria, urgency, frequency, hematuria, flank pain and difficulty urinating.   Musculoskeletal:  Denies myalgias, back pain,and gait problem.   Skin:  Denies pallor, rash and wound.   Neurological:  Denies dizziness, seizures, syncope, weakness, light-headedness, numbness and headaches.    .      Past Medical History  Diagnosis Date  . Coronary artery disease   . Diabetes mellitus   . Hypertension   . CHF (congestive heart failure)     Systolic.  EF 10-15%  . Polysubstance abuse     history of  (cocaine, tobacco and ETOH)   Current Outpatient Prescriptions  Medication Sig Dispense Refill  . allopurinol (ZYLOPRIM) 100 MG tablet Take 100 mg by mouth 2 (two) times daily.       [name] aspirin 81 MG chewable tablet Chew 81 mg by mouth daily.      [name]  atorvastatin (LIPITOR) 40 MG tablet Take 1 tablet (40 mg total) by mouth daily.  30 tablet  11  . carvedilol (COREG) 6.25 MG tablet Take 1 tablet (6.25 mg total) by mouth 2 (two) times daily with a meal.  60 tablet  3  . colchicine 0.6 MG tablet Take 1 tablet (0.6 mg total) by mouth 2 (two) times daily.  30 tablet  0  . digoxin (LANOXIN) 0.125 MG tablet Take 1 tablet (0.125 mg total) by mouth daily.  30 tablet  3  . furosemide (LASIX) 40 MG tablet Take 1 tablet (40 mg total) by mouth 2 (two) times daily.  30 tablet    . glucose blood (ACCU-CHEK INSTANT PLUS TEST) test strip Use to test blood sugars 4 times a day. Insulin dependent. Dx code; 250.02.  120 each  12  . insulin glargine (LANTUS) 100 UNIT/ML injection Inject 30 Units into the skin at bedtime. Or as directed by physician      . Insulin Pen Needle (B-D ULTRAFINE III SHORT PEN) 31G X 8 MM MISC 1 Device by Does not apply route as directed.  30 each  12  . lisinopril (PRINIVIL,ZESTRIL) 5 MG tablet Take 1 tablet (5 mg total) by mouth 2 (two) times daily.  60 tablet  3  . nitroGLYCERIN (NITROSTAT) 0.4 MG SL tablet Place 1 tablet (0.4 mg total) under the tongue every 5 (five) minutes as needed. For chest pain.  25 tablet  11  . spironolactone (ALDACTONE) 25 MG tablet Take 1 tablet (25 mg total) by mouth daily.  30 tablet  5  . [DISCONTINUED] sitaGLIPtin (JANUVIA) 25 MG tablet Take 1 tablet (25 mg total) by mouth daily.  30 tablet  1   No current facility-administered medications for this visit.   Family History  Problem Relation Age of Onset  . Heart attack Mother     Died age 46  . Diabetes Maternal Grandmother   . Heart attack Cousin   . Heart attack Maternal Uncle    History   Social History  . Marital Status: Widowed    Spouse Name: N/A    Number of Children: N/A  . Years of Education: N/A   Occupational History  . disable    Social History Main Topics  . Smoking status: Former Smoker    Quit date: 03/03/2011  . Smokeless  tobacco: None  . Alcohol Use: Yes     Comment: Had some drinks New Years, describes social drinkingC  . Drug Use: No     Comment: Has history of cocaine use, denies recent  . Sexually Active: None   Other Topics Concern  . None   Social History Narrative  . None   Objective:  Physical Exam: Filed Vitals:   06/10/12 1409  BP: 95/66  Pulse: 57  Temp: 97.8 F (36.6 C)  TempSrc: Oral  Height: 5' (1.524 m)  Weight: 175 lb 8 oz (79.606 kg)  SpO2: 99%   General: alert, well-developed, and cooperative to examination.  Head: normocephalic and atraumatic.  Eyes: vision grossly intact, pupils equal, pupils round, pupils reactive to light, no injection and anicteric.  Mouth: pharynx pink and moist, no erythema, and no exudates.  Neck: supple, full ROM, no thyromegaly, no JVD, and no carotid bruits.  Lungs: normal respiratory effort, no accessory muscle use, normal breath sounds, no crackles, and no wheezes. Heart: normal rate, regular rhythm, no murmur, no gallop, and no rub.  Abdomen: soft, non-tender, normal bowel sounds, no distention, no guarding, no rebound tenderness, no hepatomegaly, and no splenomegaly.  Msk: right 1st MTP joint mild swelling and redness noted. tenderness to palpation. No warmth to touch. No limited ROM. PPP Pulses: 2+ DP/PT pulses bilaterally Extremities: No cyanosis, clubbing, edema Neurologic: alert & oriented X3, cranial nerves II-XII intact, strength normal in all extremities, sensation intact to light touch, and gait normal.  Skin: turgor normal and no rashes.  Psych: Oriented X3, memory intact for recent and remote, normally interactive, good eye contact, not anxious appearing, and not depressed appearing.    Assessment & Plan:

## 2012-06-10 NOTE — Patient Instructions (Addendum)
I will see you in 2 weeks. Please come back for blood drawl on 4/14.  2. You have mild gout, and I think that Colchicine should wrk for you. Take 1 tab one daily to see how you feel. If you feel pain is not controlled. Take 1 tab twice daily.   Gout Gout is an inflammatory condition (arthritis) caused by a buildup of uric acid crystals in the joints. Uric acid is a chemical that is normally present in the blood. Under some circumstances, uric acid can form into crystals in your joints. This causes joint redness, soreness, and swelling (inflammation). Repeat attacks are common. Over time, uric acid crystals can form into masses (tophi) near a joint, causing disfigurement. Gout is treatable and often preventable. CAUSES  The disease begins with elevated levels of uric acid in the blood. Uric acid is produced by your body when it breaks down a naturally found substance called purines. This also happens when you eat certain foods such as meats and fish. Causes of an elevated uric acid level include:  Being passed down from parent to child (heredity).  Diseases that cause increased uric acid production (obesity, psoriasis, some cancers).  Excessive alcohol use.  Diet, especially diets rich in meat and seafood.  Medicines, including certain cancer-fighting drugs (chemotherapy), diuretics, and aspirin.  Chronic kidney disease. The kidneys are no longer able to remove uric acid well.  Problems with metabolism. Conditions strongly associated with gout include:  Obesity.  High blood pressure.  High cholesterol.  Diabetes. Not everyone with elevated uric acid levels gets gout. It is not understood why some people get gout and others do not. Surgery, joint injury, and eating too much of certain foods are some of the factors that can lead to gout. SYMPTOMS   An attack of gout comes on quickly. It causes intense pain with redness, swelling, and warmth in a joint.  Fever can occur.  Often,  only one joint is involved. Certain joints are more commonly involved:  Base of the big toe.  Knee.  Ankle.  Wrist.  Finger. Without treatment, an attack usually goes away in a few days to weeks. Between attacks, you usually will not have symptoms, which is different from many other forms of arthritis. DIAGNOSIS  Your caregiver will suspect gout based on your symptoms and exam. Removal of fluid from the joint (arthrocentesis) is done to check for uric acid crystals. Your caregiver will give you a medicine that numbs the area (local anesthetic) and use a needle to remove joint fluid for exam. Gout is confirmed when uric acid crystals are seen in joint fluid, using a special microscope. Sometimes, blood, urine, and X-ray tests are also used. TREATMENT  There are 2 phases to gout treatment: treating the sudden onset (acute) attack and preventing attacks (prophylaxis). Treatment of an Acute Attack  Medicines are used. These include anti-inflammatory medicines or steroid medicines.  An injection of steroid medicine into the affected joint is sometimes necessary.  The painful joint is rested. Movement can worsen the arthritis.  You may use warm or cold treatments on painful joints, depending which works best for you.  Discuss the use of coffee, vitamin C, or cherries with your caregiver. These may be helpful treatment options. Treatment to Prevent Attacks After the acute attack subsides, your caregiver may advise prophylactic medicine. These medicines either help your kidneys eliminate uric acid from your body or decrease your uric acid production. You may need to stay on these medicines for  a very long time. The early phase of treatment with prophylactic medicine can be associated with an increase in acute gout attacks. For this reason, during the first few months of treatment, your caregiver may also advise you to take medicines usually used for acute gout treatment. Be sure you understand  your caregiver's directions. You should also discuss dietary treatment with your caregiver. Certain foods such as meats and fish can increase uric acid levels. Other foods such as dairy can decrease levels. Your caregiver can give you a list of foods to avoid. HOME CARE INSTRUCTIONS   Do not take aspirin to relieve pain. This raises uric acid levels.  Only take over-the-counter or prescription medicines for pain, discomfort, or fever as directed by your caregiver.  Rest the joint as much as possible. When in bed, keep sheets and blankets off painful areas.  Keep the affected joint raised (elevated).  Use crutches if the painful joint is in your leg.  Drink enough water and fluids to keep your urine clear or pale yellow. This helps your body get rid of uric acid. Do not drink alcoholic beverages. They slow the passage of uric acid.  Follow your caregiver's dietary instructions. Pay careful attention to the amount of protein you eat. Your daily diet should emphasize fruits, vegetables, whole grains, and fat-free or low-fat milk products.  Maintain a healthy body weight. SEEK MEDICAL CARE IF:   You have an oral temperature above 102 F (38.9 C).  You develop diarrhea, vomiting, or any side effects from medicines.  You do not feel better in 24 hours, or you are getting worse. SEEK IMMEDIATE MEDICAL CARE IF:   Your joint becomes suddenly more tender and you have:  Chills.  An oral temperature above 102 F (38.9 C), not controlled by medicine. MAKE SURE YOU:   Understand these instructions.  Will watch your condition.  Will get help right away if you are not doing well or get worse. Document Released: 02/15/2000 Document Revised: 05/12/2011 Document Reviewed: 05/28/2009 [facility] Patient Information 2013 [place], [place].  Colchicine tablets What is this medicine? COLCHICINE (KOL chi seen) is for joint pain and swelling due to attacks of acute gouty arthritis. The medicine is  also used to treat familial Mediterranean fever. This medicine may be used for other purposes; ask your health care provider or pharmacist if you have questions. What should I tell my health care provider before I take this medicine? They need to know if you have any of these conditions: -anemia -blood disorders like leukemia or lymphoma -heart disease -immune system problems -intestinal disease -kidney disease -liver disease -muscle pain or weakness -take other medicines -stomach problems -an unusual or allergic reaction to colchicine, other medicines, lactose, foods, dyes, or preservatives -pregnant or trying to get pregnant -breast-feeding How should I use this medicine? Take this medicine by mouth with a full glass of water. Follow the directions on the prescription label. You can take it with or without food. If it upsets your stomach, take it with food. Take your medicine at regular intervals. Do not take your medicine more often than directed. A special MedGuide will be given to you by the pharmacist with each prescription and refill. Be sure to read this information carefully each time. Talk to your pediatrician regarding the use of this medicine in children. While this drug may be prescribed for children as young as 63 years old for selected conditions, precautions do apply. Patients over 71 years old may have a stronger  reaction and need a smaller dose. Overdosage: If you think you have taken too much of this medicine contact a poison control center or emergency room at once. NOTE: This medicine is only for you. Do not share this medicine with others. What if I miss a dose? If you miss a dose, take it as soon as you can. If it is almost time for your next dose, take only that dose. Do not take double or extra doses. What may interact with this medicine? -alcohol -certain medicines for cholesterol like atorvastatin -certain medicines for coughs and colds -certain medicines to  help you breathe better -cyclosporine -digoxin -epinephrine -grapefruit or grapefruit juice -methenamine -sodium bicarbonate -some antibiotics like clarithromycin, erythromycin, and telithromycin -some medicines for an irregular heartbeat or other heart problems -some medicines for cancer, like lapatinib and tamoxifen -some medicines for fungal infection -some medicines for HIV This list may not describe all possible interactions. Give your health care provider a list of all the medicines, herbs, non-prescription drugs, or dietary supplements you use. Also tell them if you smoke, drink alcohol, or use illegal drugs. Some items may interact with your medicine. What should I watch for while using this medicine? Visit your doctor or health care professional for regular checks on your progress. You may need periodic blood checks. Alcohol can increase the chance of getting stomach problems and gout attacks. Do not drink alcohol. What side effects may I notice from receiving this medicine? Side effects that you should report to your doctor or health care professional as soon as possible: -allergic reactions like skin rash, itching or hives, swelling of the face, lips, or tongue -fever, chills, or sore throat -muscle tenderness, pain, or weakness -numbness or tingling in hands or feet -unusual bleeding or bruising -unusually weak or tired -vomiting Side effects that usually do not require medical attention (report to your doctor or health care professional if they continue or are bothersome): -diarrhea -hair loss -loss of appetite -stomach pain or nausea This list may not describe all possible side effects. Call your doctor for medical advice about side effects. You may report side effects to FDA at 1-800-FDA-1088. Where should I keep my medicine? Keep out of the reach of children. Store at room temperature between 15 and 30 degrees C (59 and 86 degrees F). Keep container tightly closed.  Protect from light. Throw away any unused medicine after the expiration date. NOTE: This sheet is a summary. It may not cover all possible information. If you have questions about this medicine, talk to your doctor, pharmacist, or health care provider.  2012, [employer]. (10/09/2009 12:57:48 PM)

## 2012-06-10 NOTE — Assessment & Plan Note (Signed)
I think that she has resolving gout, but not completely resolved.  She was given oral prednisone last month.  She doesn't take any NSAIDs or prednisone now.  - Avoid NSAIDs treatment given her extensive cardiac history and CKD.  - Avoid oral glucocorticoid due to its systemic effect - I will try 1- 2 weeks of short-course Colchicine for her gout and monitor her kidney function in 1- 2 weeks. ( her current GFR reviewed).  - The plan is to gradually increase the allopurinol dosage for goal of uric acid less than 6.  I plan to do this 2-3 weeks after her gout is completely resolved - Patient agree with the above plan.  I'll give her a handout for colchicine and gout.

## 2012-06-14 ENCOUNTER — Telehealth (HOSPITAL_COMMUNITY): Payer: Self-pay | Admitting: *Deleted

## 2012-06-14 ENCOUNTER — Other Ambulatory Visit (INDEPENDENT_AMBULATORY_CARE_PROVIDER_SITE_OTHER): Payer: Medicare Other

## 2012-06-14 DIAGNOSIS — I5022 Chronic systolic (congestive) heart failure: Secondary | ICD-10-CM

## 2012-06-14 DIAGNOSIS — M109 Gout, unspecified: Secondary | ICD-10-CM

## 2012-06-14 LAB — BASIC METABOLIC PANEL WITH GFR
BUN: 44 mg/dL — ABNORMAL HIGH (ref 6–23)
Chloride: 104 mEq/L (ref 96–112)
GFR, Est Non African American: 41 mL/min — ABNORMAL LOW
Glucose, Bld: 127 mg/dL — ABNORMAL HIGH (ref 70–99)
Potassium: 4.5 mEq/L (ref 3.5–5.3)

## 2012-06-14 NOTE — Telephone Encounter (Signed)
Per [name], he has spoken with Dr Graciela Husbands and they would like for pt to have a CPX test done, scheduled for 4/17 at 11:30 pt is aware

## 2012-06-17 ENCOUNTER — Ambulatory Visit (HOSPITAL_COMMUNITY): Payer: Medicare Other | Attending: Internal Medicine

## 2012-06-17 DIAGNOSIS — I5022 Chronic systolic (congestive) heart failure: Secondary | ICD-10-CM

## 2012-06-17 DIAGNOSIS — R0602 Shortness of breath: Secondary | ICD-10-CM

## 2012-06-17 DIAGNOSIS — I509 Heart failure, unspecified: Secondary | ICD-10-CM | POA: Insufficient documentation

## 2012-06-21 ENCOUNTER — Encounter (HOSPITAL_COMMUNITY): Payer: Self-pay | Admitting: Physician Assistant

## 2012-06-24 ENCOUNTER — Encounter (HOSPITAL_COMMUNITY): Payer: Self-pay

## 2012-06-24 ENCOUNTER — Ambulatory Visit (HOSPITAL_COMMUNITY)
Admission: RE | Admit: 2012-06-24 | Discharge: 2012-06-24 | Disposition: A | Discharge status: Home / Self Care | Payer: Medicare Other | Source: Ambulatory Visit | Attending: Internal Medicine | Admitting: Internal Medicine

## 2012-06-24 ENCOUNTER — Ambulatory Visit (INDEPENDENT_AMBULATORY_CARE_PROVIDER_SITE_OTHER): Payer: Medicare Other | Admitting: Internal Medicine

## 2012-06-24 ENCOUNTER — Encounter: Payer: Medicare Other | Admitting: Dietician

## 2012-06-24 ENCOUNTER — Encounter: Payer: Self-pay | Admitting: Internal Medicine

## 2012-06-24 VITALS — BP 104/66 | HR 68 | Wt 174.8 lb

## 2012-06-24 VITALS — BP 84/59 | HR 60 | Temp 97.7°F | Ht 60.0 in | Wt 175.2 lb

## 2012-06-24 DIAGNOSIS — I5022 Chronic systolic (congestive) heart failure: Secondary | ICD-10-CM

## 2012-06-24 DIAGNOSIS — I509 Heart failure, unspecified: Secondary | ICD-10-CM | POA: Insufficient documentation

## 2012-06-24 DIAGNOSIS — I10 Essential (primary) hypertension: Secondary | ICD-10-CM | POA: Insufficient documentation

## 2012-06-24 DIAGNOSIS — M109 Gout, unspecified: Secondary | ICD-10-CM

## 2012-06-24 DIAGNOSIS — E1165 Type 2 diabetes mellitus with hyperglycemia: Secondary | ICD-10-CM

## 2012-06-24 MED ORDER — ALLOPURINOL 100 MG PO TABS: ORAL_TABLET | ORAL | Status: DC

## 2012-06-24 MED ORDER — INSULIN ASPART 100 UNIT/ML ~~LOC~~ SOLN: SUBCUTANEOUS | Status: DC

## 2012-06-24 MED ORDER — INSULIN GLARGINE 100 UNIT/ML ~~LOC~~ SOLN: 36.0000 [IU] | Freq: Every day | SUBCUTANEOUS | Status: DC

## 2012-06-24 NOTE — Assessment & Plan Note (Addendum)
Her fasting CBGs at all meals are better controlled.  She would occasionally have high blood sugar were 200s.  Denies any hypoglycemic events.  She reports her lowers blood sugar was at 80s.  Plan  - Continue Lantus at 36 units.  I think she is at where she needs to be for her basal insulin control. -- She is eager to start meal coverage treatment, and she has had discussion on carb counts with Lupita Leash.  Her TDD is 40  Meal time coverage should be 500/40 ~=12. So one unit for every 12 CHO.  CBGs coverage should be 1800/40= 45. So one unit = 40 mg/dl reduction.  I explained to her how to calculate the mealtime coverage.  She understand the plan and calculation. Patient is instructed to call me or go to ED if she has hypoglycemic events or CBG < 75  Lab Results  Component Value Date   HGBA1C 7.8 05/13/2012   HGBA1C 9.6* 01/27/2012   HGBA1C 6.1* 03/16/2011     Assessment: Diabetes control: fair control Progress toward A1C goal:  improved Comments:  Plan: Medications:  continue current medications Home glucose monitoring: Frequency: 4 times a day Timing:   Instruction/counseling given: reminded to get eye exam, reminded to bring blood glucose meter & log to each visit and reminded to bring medications to each visit Educational resources provided: brochure Self management tools provided: copy of home glucose meter download Other plans:

## 2012-06-24 NOTE — Patient Instructions (Addendum)
General Instructions:  1. Increase your Allopurinol to 200 mg po in am and 100 mg po in pm starting 07/12/12 2. Continue to take your Lantus 36 units at bedtime. Start Novolog insulin TID before meals.  meal coverage one unit for every 12 CHO.  CBGs coverage one unit = 40 mg/dl reduction.   Patient is instructed to call me or go to ED if she has hypoglycemic events or CBG < 75   3. Will check your BMp today 4. Will check your Uric acid level in June.  Treatment Goals:  Goals (1 Years of Data) as of 06/24/12         As of Today As of Today 06/10/12 06/02/12 05/24/12     Blood Pressure    . Blood Pressure < 140/90  84/59 104/66 95/66 102/50 96/54    . Blood Pressure < 140/90  84/59 104/66 95/66 102/50 96/54     Result Component    . HEMOGLOBIN A1C < 7.0          . HEMOGLOBIN A1C < 7.0          . LDL CALC < 100          . LDL CALC < 70            Progress Toward Treatment Goals:  Treatment Goal 06/24/2012  Hemoglobin A1C improved  Blood pressure at goal    Self Care Goals & Plans:  Self Care Goal 06/24/2012  Manage my medications take my medicines as prescribed; refill my medications on time; bring my medications to every visit; follow the sick day instructions if I am sick  Monitor my health -  Eat healthy foods drink diet soda or water instead of juice or soda; eat more vegetables  Be physically active -  Meeting treatment goals maintain the current self-care plan    Home Blood Glucose Monitoring 06/24/2012  Check my blood sugar 4 times a day  When to check my blood sugar -     Care Management & Community Referrals:  Referral 06/24/2012  Referrals made for care management support diabetes educator  Referrals made to community resources weight management

## 2012-06-24 NOTE — Progress Notes (Signed)
Subjective:   Patient ID: [name] female   DOB: [birth year] 57 y.o.   MRN: [id number]  HPI: [name] is a 57 y.o. 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 She is here for follow up.   1. Gout Patient has history of gout which usually causes great toe pain.  She was noted to have acute gout attack last month on right first MTP joint, which was treated by oral prednisone prescribed by Dr. Gala Romney. She called me and told me that her gout is not completely resolved.  I gave your a short course of Colchicine and she reports that her gout is completely resolved.   2. DM type 2. Takes Lantus 36 units now with much better CBG control.  Denies any hypoglycemic events.  She reports that her lower blood sugar was at 80s.  She is eager to learn how to control her pre-prandial and postprandial sugars.  She has had a couple educational carb counts sesions with Lupita Leash.   Review of Systems:  Constitutional:  Denies fever, chills, diaphoresis, appetite change and fatigue.   HEENT:  Denies congestion, sore throat, rhinorrhea, sneezing, mouth sores, trouble swallowing, neck pain   Respiratory:  Denies SOB, DOE, cough, and wheezing.   Cardiovascular:  Denies palpitations and leg swelling.   Gastrointestinal:  Denies nausea, vomiting, abdominal pain, diarrhea, constipation, blood in stool and abdominal distention.   Genitourinary:  Denies dysuria, urgency, frequency, hematuria, flank pain and difficulty urinating.   Musculoskeletal:  Denies myalgias, back pain,and gait problem.   Skin:  Denies pallor, rash and wound.   Neurological:  Denies dizziness, seizures, syncope, weakness, light-headedness, numbness and headaches.    .      Past Medical History  Diagnosis Date  . Coronary artery disease   . Diabetes mellitus   . Hypertension   . CHF (congestive  heart failure)     Systolic.  EF 10-15%  . Polysubstance abuse     history of  (cocaine, tobacco and ETOH)   Current Outpatient Prescriptions  Medication Sig Dispense Refill  . allopurinol (ZYLOPRIM) 100 MG tablet Take 100 mg by mouth 2 (two) times daily.       [name] aspirin 81 MG chewable tablet Chew 81 mg by mouth daily.      [name] atorvastatin (LIPITOR) 40 MG tablet Take 1 tablet (40 mg total) by mouth daily.  30 tablet  11  . carvedilol (COREG) 6.25 MG tablet Take 1 tablet (6.25 mg total) by mouth 2 (two) times daily with a meal.  60 tablet  3  . digoxin (LANOXIN) 0.125 MG tablet Take 1 tablet (0.125 mg total) by mouth daily.  30 tablet  3  . furosemide (LASIX) 40 MG tablet Take 1 tablet (40 mg total) by mouth 2 (two) times daily.  30 tablet    . glucose blood (ACCU-CHEK INSTANT PLUS TEST) test strip Use to test blood sugars 4 times a day. Insulin dependent. Dx code; 250.02.  120 each  12  . insulin glargine (LANTUS) 100 UNIT/ML injection Inject 30 Units into the skin at bedtime. Or as directed by physician      . Insulin Pen Needle (B-D ULTRAFINE III SHORT PEN) 31G X 8 MM MISC 1 Device by Does not apply route as directed.  30 each  12  . lisinopril (PRINIVIL,ZESTRIL) 5 MG tablet Take 1 tablet (5 mg  total) by mouth 2 (two) times daily.  60 tablet  3  . nitroGLYCERIN (NITROSTAT) 0.4 MG SL tablet Place 1 tablet (0.4 mg total) under the tongue every 5 (five) minutes as needed. For chest pain.  25 tablet  11  . spironolactone (ALDACTONE) 25 MG tablet Take 1 tablet (25 mg total) by mouth daily.  30 tablet  5  . [DISCONTINUED] sitaGLIPtin (JANUVIA) 25 MG tablet Take 1 tablet (25 mg total) by mouth daily.  30 tablet  1   No current facility-administered medications for this visit.   Family History  Problem Relation Age of Onset  . Heart attack Mother     Died age 15  . Diabetes Maternal Grandmother   . Heart attack Cousin   . Heart attack Maternal Uncle    History   Social History  . Marital  Status: Widowed    Spouse Name: N/A    Number of Children: N/A  . Years of Education: N/A   Occupational History  . disable    Social History Main Topics  . Smoking status: Former Smoker    Quit date: 03/03/2011  . Smokeless tobacco: None  . Alcohol Use: Yes     Comment: Had some drinks New Years, describes social drinkingC  . Drug Use: No     Comment: Has history of cocaine use, denies recent  . Sexually Active: None   Other Topics Concern  . None   Social History Narrative  . None   Objective:  Physical Exam: Filed Vitals:   06/24/12 1347  BP: 84/59  Pulse: 60  Temp: 97.7 F (36.5 C)  TempSrc: Oral  Height: 5' (1.524 m)  Weight: 175 lb 3.2 oz (79.47 kg)  SpO2: 99%  General: alert, well-developed, and cooperative to examination.  Head: normocephalic and atraumatic.  Eyes: vision grossly intact, pupils equal, pupils round, pupils reactive to light, no injection and anicteric.  Mouth: pharynx pink and moist, no erythema, and no exudates.  Neck: supple, full ROM, no thyromegaly, no JVD, and no carotid bruits.  Lungs: normal respiratory effort, no accessory muscle use, normal breath sounds, no crackles, and no wheezes. Heart: normal rate, regular rhythm, no murmur, no gallop, and no rub.  Abdomen: soft, non-tender, normal bowel sounds, no distention, no guarding, no rebound tenderness, no hepatomegaly, and no splenomegaly.  Msk: no joint swelling, no joint warmth, and no redness over joints.  Pulses: 2+ DP/PT pulses bilaterally Extremities: No cyanosis, clubbing, edema Neurologic: alert & oriented X3, cranial nerves II-XII intact, strength normal in all extremities, sensation intact to light touch, and gait normal.  Skin: turgor normal and no rashes.  Psych: Oriented X3, memory intact for recent and remote, normally interactive, good eye contact, not anxious appearing, and not depressed appearing.     Assessment & Plan:

## 2012-06-24 NOTE — Assessment & Plan Note (Signed)
Her acute gout flareup is completely resolved.  She only took colchicine for 5 days and stopped it  - Patient patient to start increase her allopurinol to 200 mg by mouth in a.m. and 100 mg by mouth in pm starting on 07/12/12. - She should come back for uric acid level in the mid June.  --She agrees with the plan. -Will repeat her BMP today

## 2012-06-24 NOTE — Progress Notes (Signed)
HPI: 57 year old female with history of HTN, DM2, polysubstance abuse (ETOH, tobacco, cocaine) and PAD. 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 At time of cath LAD, LCX and RCA occluded. EF 45%. Had abrupt stent occlusion the next day and had to go back to the lab. Had PCI of LAD and then underwent CABG x 5 with mitral valve annuloplasty.   ECHO 2011 EF 20-25% -> Medtronic ICD placed.  [facility] 03/17/11  RA = 8  RV = 47/1/9  PA = 52/12 (28)  PCW = 20 no significant v-waves  Fick cardiac output/index = 4.2/2.2  PVR =1.6 Woods  FA sat = 95%  PA sat = 61%, 63%  Ao Pressure: 89/58 (71)  LV Pressure: 86/12/24  Severe native CAD with all bypass grafts patent  Echo 03/2011: Severe LV dysfunction EF 10-15%   04/10/11: CPX (on Carvedilol)  Peak VO2: 13.6 ml/kg/min predicted peak VO2: 63.3% VE/VCO2 slope: 44.9 OUES: 1.11Peak RER: 1.12   11/17/11 ECHO EF 25-30% 05/19/2012 ECHO EF 15%  CPX: 06/17/12 Peak VO2 14.8 (predicted peak VO2 68.5%), VE/VCO2 slope 43.4 OUES: 1.19, Peak RER 1.12 Vent threshold 11.3 (pred peak VO2 52.3%)  She returns for follow up today.  She is feeling well.  She denies orthopnea/PND.  She says she is going to gym ~ 1 hour a day, 4 miles on stepper and 2 miles on bicycle.  She denies alcohol or tobacco.  Compliant with medications.     ROS: All systems negative except as listed in HPI, PMH and Problem List.  Past Medical History  Diagnosis Date  . Coronary artery disease   . Diabetes mellitus   . Hypertension   . CHF (congestive heart failure)     Systolic.  EF 10-15%  . Polysubstance abuse     history of  (cocaine, tobacco and ETOH)    Current Outpatient Prescriptions  Medication Sig Dispense Refill  . allopurinol (ZYLOPRIM) 100 MG tablet Take 100 mg by mouth 2 (two) times daily.       [name] aspirin 81 MG chewable tablet Chew 81 mg by mouth daily.      [name] atorvastatin (LIPITOR) 40 MG tablet Take 1 tablet (40 mg total) by  mouth daily.  30 tablet  11  . carvedilol (COREG) 6.25 MG tablet Take 1 tablet (6.25 mg total) by mouth 2 (two) times daily with a meal.  60 tablet  3  . digoxin (LANOXIN) 0.125 MG tablet Take 1 tablet (0.125 mg total) by mouth daily.  30 tablet  3  . furosemide (LASIX) 40 MG tablet Take 1 tablet (40 mg total) by mouth 2 (two) times daily.  30 tablet    . glucose blood (ACCU-CHEK INSTANT PLUS TEST) test strip Use to test blood sugars 4 times a day. Insulin dependent. Dx code; 250.02.  120 each  12  . insulin glargine (LANTUS) 100 UNIT/ML injection Inject 30 Units into the skin at bedtime. Or as directed by physician      . Insulin Pen Needle (B-D ULTRAFINE III SHORT PEN) 31G X 8 MM MISC 1 Device by Does not apply route as directed.  30 each  12  . lisinopril (PRINIVIL,ZESTRIL) 5 MG tablet Take 1 tablet (5 mg total) by mouth 2 (two) times daily.  60 tablet  3  . nitroGLYCERIN (NITROSTAT) 0.4 MG SL tablet Place 1 tablet (0.4 mg total) under the tongue every 5 (five) minutes as needed. For  chest pain.  25 tablet  11  . spironolactone (ALDACTONE) 25 MG tablet Take 1 tablet (25 mg total) by mouth daily.  30 tablet  5  . [DISCONTINUED] sitaGLIPtin (JANUVIA) 25 MG tablet Take 1 tablet (25 mg total) by mouth daily.  30 tablet  1   No current facility-administered medications for this encounter.     PHYSICAL EXAM: Filed Vitals:   06/24/12 0911  BP: 104/66  Pulse: 68  Weight: 174 lb 12.8 oz (79.289 kg)  SpO2: 97%    General:  Well appearing. No resp difficulty HEENT: normal Neck: supple. JVP 8-9 Carotids 2+ bilaterally; no bruits. No lymphadenopathy or thryomegaly appreciated. Cor: PMI normal. Regular rate & rhythm. 2/6 TR Lungs: clear Abdomen: soft, nontender, mildly distended. No hepatosplenomegaly. No bruits or masses. Good bowel sounds. Extremities: no cyanosis, clubbing, rash, tr edema Neuro: alert & orientedx3, cranial nerves grossly intact. Moves all 4 extremities w/o difficulty. Affect  pleasant.   ASSESSMENT & PLAN:

## 2012-06-25 ENCOUNTER — Telehealth: Payer: Self-pay | Admitting: *Deleted

## 2012-06-25 ENCOUNTER — Encounter: Payer: Self-pay | Admitting: Internal Medicine

## 2012-06-25 LAB — BASIC METABOLIC PANEL WITH GFR
BUN: 60 mg/dL — ABNORMAL HIGH (ref 6–23)
CO2: 26 mEq/L (ref 19–32)
Chloride: 99 mEq/L (ref 96–112)
Creat: 1.48 mg/dL — ABNORMAL HIGH (ref 0.50–1.10)

## 2012-06-25 NOTE — Telephone Encounter (Signed)
[facility] called and ask for clarification on directions for novolog, they are going to put directions as "per sliding scale", they would really like directions to be the way pt understands,

## 2012-06-28 NOTE — Assessment & Plan Note (Addendum)
Volume status well controlled.  Have discussed recent CPX test.  Functional class is difficult to determine but recent CPX reveals moderate functional limitation due to her heart failure. She also has a markedly elevated VE/VCO2 slope which suggest a poor prognosis.  I have discussed with Dr. Gala Romney and with moderate limitations will on CPX we would recommend upgrade CRT with Dr. Graciela Husbands.  She is agreeable to this plan.  Have also discussed further advanced therapies (inotropes vs LVAD) but patient is not interested at this time. Hold off on med titration with soft BP.  Will continue to follow closely in clinic.

## 2012-07-08 NOTE — Telephone Encounter (Signed)
I have written the detailed instruction on the Rx. Not sure why they are calling. I will call [facility]. Thank you. Dr. Dierdre Searles

## 2012-08-12 ENCOUNTER — Other Ambulatory Visit (INDEPENDENT_AMBULATORY_CARE_PROVIDER_SITE_OTHER): Payer: Medicare Other

## 2012-08-12 ENCOUNTER — Other Ambulatory Visit: Payer: Self-pay | Admitting: Internal Medicine

## 2012-08-12 DIAGNOSIS — M109 Gout, unspecified: Secondary | ICD-10-CM

## 2012-08-12 DIAGNOSIS — E1165 Type 2 diabetes mellitus with hyperglycemia: Secondary | ICD-10-CM

## 2012-08-12 LAB — LIPID PANEL
HDL: 29 mg/dL — ABNORMAL LOW (ref >39)
Total CHOL/HDL Ratio: 6.2 Ratio
Triglycerides: 195 mg/dL — ABNORMAL HIGH (ref <150)

## 2012-08-12 LAB — URIC ACID: Uric Acid, Serum: 4.6 mg/dL (ref 2.4–7.0)

## 2012-08-19 ENCOUNTER — Encounter: Payer: Self-pay | Admitting: Internal Medicine

## 2012-08-19 ENCOUNTER — Ambulatory Visit (INDEPENDENT_AMBULATORY_CARE_PROVIDER_SITE_OTHER): Payer: Medicare Other | Admitting: Internal Medicine

## 2012-08-19 VITALS — BP 95/56 | HR 63 | Temp 97.2°F | Ht 60.0 in | Wt 175.7 lb

## 2012-08-19 DIAGNOSIS — M109 Gout, unspecified: Secondary | ICD-10-CM

## 2012-08-19 DIAGNOSIS — IMO0002 Reserved for concepts with insufficient information to code with codable children: Secondary | ICD-10-CM

## 2012-08-19 DIAGNOSIS — E1165 Type 2 diabetes mellitus with hyperglycemia: Secondary | ICD-10-CM

## 2012-08-19 LAB — POCT GLYCOSYLATED HEMOGLOBIN (HGB A1C): Hemoglobin A1C: 7.3

## 2012-08-19 NOTE — Progress Notes (Signed)
Subjective:   Patient ID: [name] female   DOB: 1955-12-02 57 y.o.   MRN: [id number]  HPI:  [name] is a 57 y.o. 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 She is here for follow up.  She states this is to be fine.  She doesn't have any complaint right now.  Please see my assessment plan.  Past Medical History  Diagnosis Date  . Coronary artery disease   . Diabetes mellitus   . Hypertension   . CHF (congestive heart failure)     Systolic.  EF 10-15%  . Polysubstance abuse     history of  (cocaine, tobacco and ETOH)   Current Outpatient Prescriptions  Medication Sig Dispense Refill  . allopurinol (ZYLOPRIM) 100 MG tablet Take 200 mg po in am and 100 mg po in pm  90 tablet  11  . aspirin 81 MG chewable tablet Chew 81 mg by mouth daily.      [name] atorvastatin (LIPITOR) 40 MG tablet Take 1 tablet (40 mg total) by mouth daily.  30 tablet  11  . carvedilol (COREG) 6.25 MG tablet Take 1 tablet (6.25 mg total) by mouth 2 (two) times daily with a meal.  60 tablet  3  . digoxin (LANOXIN) 0.125 MG tablet Take 1 tablet (0.125 mg total) by mouth daily.  30 tablet  3  . furosemide (LASIX) 40 MG tablet Take 1 tablet (40 mg total) by mouth 2 (two) times daily.  30 tablet    . glucose blood (ACCU-CHEK INSTANT PLUS TEST) test strip Use to test blood sugars 4 times a day. Insulin dependent. Dx code; 250.02.  120 each  12  . insulin aspart (NOVOLOG FLEXPEN) 100 UNIT/ML injection Her TDD is 40   So take one unit for every 12 CHO.  And give one unit = 40 mg/dl reduction. The goal is CBG of 110  6 mL  12  . insulin glargine (LANTUS SOLOSTAR) 100 UNIT/ML injection Inject 0.36 mLs (36 Units total) into the skin at bedtime.  12 mL  12  . Insulin Pen Needle (B-D ULTRAFINE III SHORT PEN) 31G X 8 MM MISC 1 Device by Does not apply route as directed.  30 each  12  .  lisinopril (PRINIVIL,ZESTRIL) 5 MG tablet Take 1 tablet (5 mg total) by mouth 2 (two) times daily.  60 tablet  3  . nitroGLYCERIN (NITROSTAT) 0.4 MG SL tablet Place 1 tablet (0.4 mg total) under the tongue every 5 (five) minutes as needed. For chest pain.  25 tablet  11  . spironolactone (ALDACTONE) 25 MG tablet Take 1 tablet (25 mg total) by mouth daily.  30 tablet  5  . [DISCONTINUED] sitaGLIPtin (JANUVIA) 25 MG tablet Take 1 tablet (25 mg total) by mouth daily.  30 tablet  1   No current facility-administered medications for this visit.   Family History  Problem Relation Age of Onset  . Heart attack Mother     Died age 55  . Diabetes Maternal Grandmother   . Heart attack Cousin   . Heart attack Maternal Uncle    History   Social History  . Marital Status: Widowed    Spouse Name: N/A    Number of Children: N/A  . Years of Education: N/A   Occupational History  . disable    Social History Main Topics  .  Smoking status: Former Smoker    Quit date: 03/03/2011  . Smokeless tobacco: None  . Alcohol Use: Yes     Comment: Had some drinks New Years, describes social drinkingC  . Drug Use: No     Comment: Has history of cocaine use, denies recent  . Sexually Active: None   Other Topics Concern  . None   Social History Narrative  . None   Review of Systems: Review of Systems:  Constitutional:  Denies fever, chills, diaphoresis, appetite change and fatigue.   HEENT:  Denies congestion, sore throat, rhinorrhea, sneezing, mouth sores, trouble swallowing, neck pain   Respiratory:  Denies SOB, DOE, cough, and wheezing.   Cardiovascular:  Denies palpitations and leg swelling.   Gastrointestinal:  Denies nausea, vomiting, abdominal pain, diarrhea, constipation, blood in stool and abdominal distention.   Genitourinary:  Denies dysuria, urgency, frequency, hematuria, flank pain and difficulty urinating.   Musculoskeletal:  Denies myalgias, back pain, joint swelling, arthralgias and  gait problem.   Skin:  Denies pallor, rash and wound.   Neurological:  Denies dizziness, seizures, syncope, weakness, light-headedness, numbness and headaches.    .    Objective:  Physical Exam: Filed Vitals:   08/19/12 1544  BP: 95/56  Pulse: 63  Temp: 97.2 F (36.2 C)  TempSrc: Oral  Height: 5' (1.524 m)  Weight: 175 lb 11.2 oz (79.697 kg)  SpO2: 99%   General: alert, well-developed, and cooperative to examination.  Head: normocephalic and atraumatic.  Eyes: vision grossly intact, pupils equal, pupils round, pupils reactive to light, no injection and anicteric.  Mouth: pharynx pink and moist, no erythema, and no exudates.  Neck: supple, full ROM, no thyromegaly, no JVD, and no carotid bruits.  Lungs: normal respiratory effort, no accessory muscle use, normal breath sounds, no crackles, and no wheezes. Heart: normal rate, regular rhythm, no murmur, no gallop, and no rub.  Abdomen: soft, non-tender, normal bowel sounds, no distention, no guarding, no rebound tenderness, no hepatomegaly, and no splenomegaly.  Msk: no joint swelling, no joint warmth, and no redness over joints.  Pulses: 2+ DP/PT pulses bilaterally Extremities: No cyanosis, clubbing, edema Neurologic: alert & oriented X3, cranial nerves II-XII intact, strength normal in all extremities, sensation intact to light touch, and gait normal.  Skin: turgor normal and no rashes.  Psych: Oriented X3, memory intact for recent and remote, normally interactive, good eye contact, not anxious appearing, and not depressed appearing.   Assessment & Plan:

## 2012-08-19 NOTE — Patient Instructions (Addendum)
1. Will follow up in 3 months 2. Please take your insulin as instructed. 3. Lantus 36 units at nighttime.  4. Novolog pen three times daily before meals.      The goal of blood sugar is 110. Please take one unit for every 40 mg blood sugar.                                                            Please take one unit for every 12 CHO       You should not be taking more than 8 units at one time if your blood sugar is less than 300.

## 2012-08-20 LAB — BASIC METABOLIC PANEL WITH GFR
BUN: 37 mg/dL — ABNORMAL HIGH (ref 6–23)
Chloride: 105 mEq/L (ref 96–112)
Creat: 1.44 mg/dL — ABNORMAL HIGH (ref 0.50–1.10)
GFR, Est Non African American: 41 mL/min — ABNORMAL LOW
Glucose, Bld: 91 mg/dL (ref 70–99)

## 2012-08-20 NOTE — Assessment & Plan Note (Signed)
Patient has history of gout which usually causes great toe pain. She was noted to have acute gout attack on right first MTP in March 2014, which was treated by oral prednisone and colchicine. Allopurinol was added to her regimen recently and her uric acid trended down and normalized. She denies any other gout attacks.   -continue the current therapy.

## 2012-08-20 NOTE — Assessment & Plan Note (Addendum)
She is on Lantus 36 units and Novolog TID with meals. She has had extensive educations with me and Lupita Leash, and the following regimen was started.   Her TDD is 40  Meal time coverage should be 500/40 ~=12. So one unit for every 12 CHO.  CBGs coverage should be 1800/40= 45. So one unit = 40 mg/dl reduction.   She states that she is doing well. Denies any hypoglycemic events except for CBG of 68 yesterday . She states that she accidentally calculated her Novolog dosage wrong and gave herself 20 units Novolog yesterday for CBG of 250.  She denies palpitation, sweating, feeling faint or dizziness with blood sugar of 60. The only symptom was feeling hungry.   -- Glucometer is downloaded and reviewed. --She is compliant with her glucose monitoring.  The glucometer shows that she checks her CBGs 4 times a day.  The lowest CBG was 68 yesterday, which supports her story.  -- Thorough discussion with patient with regarding to NovoLog insulin regimen.  I offered to simplify her Novolog by giving her CBG ranges for insuilin dosage. Patient states that she has been doing so well with the current regimen and her A1c is actually under control now.  She prefer not to change her regimen.  She states that she will be really careful with calculation.  I have tested her calculation during this office visit and I think she is capable to calculate her NovoLog dosage correctly.

## 2012-08-20 NOTE — Progress Notes (Signed)
Case discussed with Dr. Li soon after the resident saw the patient. We reviewed the resident's history and exam and pertinent patient test results. I agree with the assessment, diagnosis, and plan of care documented in the resident's note. 

## 2012-08-23 ENCOUNTER — Other Ambulatory Visit (HOSPITAL_COMMUNITY): Payer: Self-pay | Admitting: Adult Health

## 2012-08-30 ENCOUNTER — Other Ambulatory Visit (HOSPITAL_COMMUNITY): Payer: Self-pay | Admitting: *Deleted

## 2012-08-30 MED ORDER — FUROSEMIDE 40 MG PO TABS: 40.0000 mg | ORAL_TABLET | Freq: Two times a day (BID) | ORAL | Status: DC

## 2012-09-01 ENCOUNTER — Other Ambulatory Visit (HOSPITAL_COMMUNITY): Payer: Self-pay | Admitting: Internal Medicine

## 2012-09-02 ENCOUNTER — Encounter: Payer: Self-pay | Admitting: *Deleted

## 2012-09-06 ENCOUNTER — Encounter: Payer: Self-pay | Admitting: Internal Medicine

## 2012-09-06 ENCOUNTER — Ambulatory Visit (INDEPENDENT_AMBULATORY_CARE_PROVIDER_SITE_OTHER): Payer: Medicare Other | Admitting: Internal Medicine

## 2012-09-06 VITALS — BP 91/51 | HR 64 | Ht 64.0 in | Wt 177.0 lb

## 2012-09-06 DIAGNOSIS — I472 Ventricular tachycardia: Secondary | ICD-10-CM

## 2012-09-06 DIAGNOSIS — I255 Ischemic cardiomyopathy: Secondary | ICD-10-CM

## 2012-09-06 DIAGNOSIS — I2589 Other forms of chronic ischemic heart disease: Secondary | ICD-10-CM

## 2012-09-06 DIAGNOSIS — Z95810 Presence of automatic (implantable) cardiac defibrillator: Secondary | ICD-10-CM

## 2012-09-06 LAB — ICD DEVICE OBSERVATION
CHARGE TIME: 10.119 s
DEV-0020ICD: NEGATIVE
FVT: 0
RV LEAD AMPLITUDE: 20.625 mv
RV LEAD IMPEDENCE ICD: 551 Ohm
RV LEAD THRESHOLD: 0.75 V
TOT-0001: 3
TOT-0002: 0
TOT-0006: 20111012000000
TZAT-0001FASTVT: 1
TZAT-0001SLOWVT: 1
TZAT-0002FASTVT: NEGATIVE
TZAT-0012SLOWVT: 200 ms
TZAT-0018SLOWVT: NEGATIVE
TZAT-0020SLOWVT: 1.5 ms
TZON-0003SLOWVT: 360 ms
TZON-0003VSLOWVT: 370 ms
TZON-0004SLOWVT: 32
TZON-0004VSLOWVT: 36
TZON-0005SLOWVT: 12
TZST-0001FASTVT: 2
TZST-0001FASTVT: 3
TZST-0001FASTVT: 4
TZST-0001FASTVT: 6
TZST-0001SLOWVT: 2
TZST-0001SLOWVT: 4
TZST-0001SLOWVT: 6
TZST-0002FASTVT: NEGATIVE
TZST-0002FASTVT: NEGATIVE
TZST-0002FASTVT: NEGATIVE
TZST-0002SLOWVT: NEGATIVE
TZST-0002SLOWVT: NEGATIVE

## 2012-09-06 NOTE — Assessment & Plan Note (Signed)
the patient has a cardiomyopathy. The ECG has been borderline in the past and today demonstrates Q waves in V5 and V6 suggesting that this is more of a nonspecific IVCD pattern. There is a monophasic R-wave in lead 1 but not NL or the lateral anterior precordium. Hence, I think the likelihood of benefit at this point for CRT upgrade is quite low. I will review this with Dr. Dorthea Cove

## 2012-09-06 NOTE — Progress Notes (Signed)
Patient Care Team: Dede Query, MD as PCP - General (Internal Medicine) Duke Salvia, MD (Cardiology) Dolores Patty, MD (Cardiology)   HPI  [name] is a 57 y.o. 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  Catheterization 2013-January demonstrated patent grafts cardiac index 2.2 ejection fraction 10-15%  Electrocardiogram April 2013 demonstrated a QRS duration of 124 ms repeat today is 126.  She was seen a couple of months ago for consideration of CRT upgrade for her congestive heart failure class III. There was some discrepancy as to her functional status and she was submitted for cardiopulmonary stress testing which demonstrated a low VO2 max-14.9 with the recommendation that we proceed with CRT upgrade.    Past Medical History  Diagnosis Date  . Coronary artery disease   . Diabetes mellitus   . Hypertension   . CHF (congestive heart failure)     Systolic.  EF 10-15%  . Polysubstance abuse     history of  (cocaine, tobacco and ETOH)    Past Surgical History  Procedure Laterality Date  . Coronary artery bypass graft    . Cardiac catheterization    . Cardiac defibrillator placement      2011, Followed By Dr. Graciela Husbands    Current Outpatient Prescriptions  Medication Sig Dispense Refill  . allopurinol (ZYLOPRIM) 100 MG tablet Take 200 mg po in am and 100 mg po in pm  90 tablet  11  . aspirin 81 MG chewable tablet Chew 81 mg by mouth daily.      [name] atorvastatin (LIPITOR) 40 MG tablet Take 1 tablet (40 mg total) by mouth daily.  30 tablet  11  . carvedilol (COREG) 6.25 MG tablet Take 1 tablet (6.25 mg total) by mouth 2 (two) times daily with a meal.  60 tablet  3  . digoxin (LANOXIN) 0.125 MG tablet Take 1 tablet (0.125 mg total) by mouth daily.  30 tablet  3  . furosemide (LASIX) 40 MG tablet TAKE ONE TABLET BY MOUTH TWICE DAILY  60 tablet  6  .  glucose blood (ACCU-CHEK INSTANT PLUS TEST) test strip Use to test blood sugars 4 times a day. Insulin dependent. Dx code; 250.02.  120 each  12  . insulin aspart (NOVOLOG FLEXPEN) 100 UNIT/ML injection Her TDD is 40   So take one unit for every 12 CHO.  And give one unit = 40 mg/dl reduction. The goal is CBG of 110  6 mL  12  . insulin glargine (LANTUS SOLOSTAR) 100 UNIT/ML injection Inject 0.36 mLs (36 Units total) into the skin at bedtime.  12 mL  12  . Insulin Pen Needle (B-D ULTRAFINE III SHORT PEN) 31G X 8 MM MISC 1 Device by Does not apply route as directed.  30 each  12  . lisinopril (PRINIVIL,ZESTRIL) 5 MG tablet TAKE ONE TABLET BY MOUTH TWICE DAILY  60 tablet  3  . nitroGLYCERIN (NITROSTAT) 0.4 MG SL tablet Place 1 tablet (0.4 mg total) under the tongue every 5 (five) minutes as needed. For chest pain.  25 tablet  11  . spironolactone (ALDACTONE) 25 MG tablet Take 1 tablet (25 mg total) by mouth daily.  30 tablet  5  . [DISCONTINUED] sitaGLIPtin (JANUVIA) 25 MG tablet Take 1 tablet (25 mg total) by mouth daily.  30 tablet  1   No current facility-administered medications  for this visit.    No Known Allergies  Review of Systems negative except from HPI and PMH  Physical Exam BP 91/51  Pulse 64  Ht 5\' 4"  (1.626 m)  Wt 177 lb (80.287 kg)  BMI 30.37 kg/m2 Well developed and well nourished in no acute distress HENT normal E scleral and icterus clear Neck Supple JVP 7-8t; carotids brisk and full Clear to ausculation  egular rate and rhythm, no murmurs gallops or rub Soft with active bowel sounds No clubbing cyanosis none Edema Alert and oriented, grossly normal motor and sensory function Skin Warm and Dry  ECG today demonstrates sinus rhythm at 64 intervals 16/12/42 Nonspecific IVCD  Assessment and  Plan

## 2012-09-07 ENCOUNTER — Other Ambulatory Visit (HOSPITAL_COMMUNITY): Payer: Self-pay | Admitting: Internal Medicine

## 2012-09-15 ENCOUNTER — Encounter: Payer: Self-pay | Admitting: Cardiovascular Disease

## 2012-09-17 ENCOUNTER — Encounter: Payer: Self-pay | Admitting: *Deleted

## 2012-09-27 ENCOUNTER — Ambulatory Visit (HOSPITAL_COMMUNITY)
Admission: RE | Admit: 2012-09-27 | Discharge: 2012-09-27 | Disposition: A | Discharge status: Home / Self Care | Payer: Medicare Other | Source: Ambulatory Visit | Attending: Cardiology | Admitting: Cardiology

## 2012-09-27 ENCOUNTER — Encounter (HOSPITAL_COMMUNITY): Payer: Self-pay

## 2012-09-27 VITALS — BP 98/58 | HR 64 | Wt 174.0 lb

## 2012-09-27 DIAGNOSIS — Z9861 Coronary angioplasty status: Secondary | ICD-10-CM | POA: Insufficient documentation

## 2012-09-27 DIAGNOSIS — I129 Hypertensive chronic kidney disease with stage 1 through stage 4 chronic kidney disease, or unspecified chronic kidney disease: Secondary | ICD-10-CM | POA: Insufficient documentation

## 2012-09-27 DIAGNOSIS — I5022 Chronic systolic (congestive) heart failure: Secondary | ICD-10-CM

## 2012-09-27 DIAGNOSIS — Z7982 Long term (current) use of aspirin: Secondary | ICD-10-CM | POA: Insufficient documentation

## 2012-09-27 DIAGNOSIS — E119 Type 2 diabetes mellitus without complications: Secondary | ICD-10-CM | POA: Insufficient documentation

## 2012-09-27 DIAGNOSIS — I10 Essential (primary) hypertension: Secondary | ICD-10-CM

## 2012-09-27 DIAGNOSIS — Z951 Presence of aortocoronary bypass graft: Secondary | ICD-10-CM | POA: Insufficient documentation

## 2012-09-27 DIAGNOSIS — I509 Heart failure, unspecified: Secondary | ICD-10-CM | POA: Insufficient documentation

## 2012-09-27 DIAGNOSIS — Z79899 Other long term (current) drug therapy: Secondary | ICD-10-CM

## 2012-09-27 DIAGNOSIS — Z794 Long term (current) use of insulin: Secondary | ICD-10-CM | POA: Insufficient documentation

## 2012-09-27 DIAGNOSIS — Z950 Presence of cardiac pacemaker: Secondary | ICD-10-CM | POA: Insufficient documentation

## 2012-09-27 DIAGNOSIS — I2589 Other forms of chronic ischemic heart disease: Secondary | ICD-10-CM | POA: Insufficient documentation

## 2012-09-27 DIAGNOSIS — Z0000 Encounter for general adult medical examination without abnormal findings: Secondary | ICD-10-CM

## 2012-09-27 DIAGNOSIS — N189 Chronic kidney disease, unspecified: Secondary | ICD-10-CM | POA: Insufficient documentation

## 2012-09-27 DIAGNOSIS — I2510 Atherosclerotic heart disease of native coronary artery without angina pectoris: Secondary | ICD-10-CM | POA: Insufficient documentation

## 2012-09-27 DIAGNOSIS — E785 Hyperlipidemia, unspecified: Secondary | ICD-10-CM

## 2012-09-27 DIAGNOSIS — N183 Chronic kidney disease, stage 3 unspecified: Secondary | ICD-10-CM

## 2012-09-27 DIAGNOSIS — R0602 Shortness of breath: Secondary | ICD-10-CM

## 2012-09-27 MED ORDER — ATORVASTATIN CALCIUM 40 MG PO TABS: 80.0000 mg | ORAL_TABLET | Freq: Every day | ORAL | Status: DC

## 2012-09-27 NOTE — Progress Notes (Signed)
Patient ID: [name], female   DOB: 06-02-1955, 57 y.o.   MRN: [id number] HPI: 57 year old female with history of HTN, DM2, polysubstance abuse (ETOH, tobacco, cocaine) and PAD. 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 At time of cath LAD, LCX and RCA occluded. EF 45%. Had abrupt stent occlusion the next day and had to go back to the lab. 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   ECHO 2011 EF 20-25% -> Medtronic ICD placed.  [facility] 03/17/11  RA = 8   RV = 47/1/9  PA = 52/12 (28)  PCW = 20 no significant v-waves  Fick cardiac output/index = 4.2/2.2  PVR =1.6 Woods  FA sat = 95%  PA sat = 61%, 63%  Ao Pressure: 89/58 (71)  LV Pressure: 86/12/24  Severe native CAD with all bypass grafts patent  Echo 03/2011: Severe LV dysfunction EF 10-15%   04/10/11: CPX (on Carvedilol)  Peak VO2: 13.6 ml/kg/min predicted peak VO2: 63.3% VE/VCO2 slope: 44.9 OUES: 1.11Peak RER: 1.12   11/17/11 ECHO EF 25-30% 05/19/2012 ECHO EF 15%  CPX: 06/17/12 Peak VO2 14.8 (predicted peak VO2 68.5%), VE/VCO2 slope 43.4 OUES: 1.19, Peak RER 1.12 Vent threshold 11.3 (pred peak VO2 52.3%)  She returns for follow up today.  Denies SOB/PND/Orthopnea/CP. Weight at home 172 pounds.  She is going to the [facility] 4 days a week ~ 45 minutes which is 8 miles. Remains alcohol and drug free.    08/12/12 - on lipitor 40 mg daily Cholesterol 180 Triglyceride 195 HDL 29 LDL 112 K 4.5, creatinine 1.4  ROS: All systems negative except as listed in HPI, PMH and Problem List.  Past Medical History  Diagnosis Date  . Coronary artery disease   . Diabetes mellitus   . Hypertension   . CHF (congestive heart failure)     Systolic.  EF 10-15%  . Polysubstance abuse     history of  (cocaine, tobacco and ETOH)    Current Outpatient Prescriptions  Medication Sig Dispense Refill  . allopurinol (ZYLOPRIM) 100 MG tablet Take 200 mg po in am and  100 mg po in pm  90 tablet  11  . aspirin 81 MG chewable tablet Chew 81 mg by mouth daily.      [name] atorvastatin (LIPITOR) 40 MG tablet Take 1 tablet (40 mg total) by mouth daily.  30 tablet  11  . carvedilol (COREG) 6.25 MG tablet Take 1 tablet (6.25 mg total) by mouth 2 (two) times daily with a meal.  60 tablet  3  . digoxin (LANOXIN) 0.125 MG tablet TAKE ONE TABLET BY MOUTH ONCE DAILY  30 tablet  6  . furosemide (LASIX) 40 MG tablet TAKE ONE TABLET BY MOUTH TWICE DAILY  60 tablet  6  . glucose blood (ACCU-CHEK INSTANT PLUS TEST) test strip Use to test blood sugars 4 times a day. Insulin dependent. Dx code; 250.02.  120 each  12  . insulin aspart (NOVOLOG FLEXPEN) 100 UNIT/ML injection Her TDD is 40   So take one unit for every 12 CHO.  And give one unit = 40 mg/dl reduction. The goal is CBG of 110  6 mL  12  . Insulin Pen Needle (B-D ULTRAFINE III SHORT PEN) 31G X 8 MM MISC 1 Device by Does not apply route as directed.  30 each  12  . lisinopril (PRINIVIL,ZESTRIL) 5 MG  tablet TAKE ONE TABLET BY MOUTH TWICE DAILY  60 tablet  3  . nitroGLYCERIN (NITROSTAT) 0.4 MG SL tablet Place 1 tablet (0.4 mg total) under the tongue every 5 (five) minutes as needed. For chest pain.  25 tablet  11  . spironolactone (ALDACTONE) 25 MG tablet Take 1 tablet (25 mg total) by mouth daily.  30 tablet  5  . insulin glargine (LANTUS SOLOSTAR) 100 UNIT/ML injection Inject 0.36 mLs (36 Units total) into the skin at bedtime.  12 mL  12  . [DISCONTINUED] sitaGLIPtin (JANUVIA) 25 MG tablet Take 1 tablet (25 mg total) by mouth daily.  30 tablet  1   No current facility-administered medications for this encounter.     PHYSICAL EXAM: Filed Vitals:   09/27/12 1145  BP: 98/58  Pulse: 64  Weight: 174 lb (78.926 kg)  SpO2: 98%    General:  Well appearing. No resp difficulty HEENT: normal Neck: supple. JVP 8 Carotids 2+ bilaterally; no bruits. No lymphadenopathy or thryomegaly appreciated. Cor: PMI normal. Regular rate &  rhythm. 2/6 TR Lungs: clear Abdomen: soft, nontender, mildly distended. No hepatosplenomegaly. No bruits or masses. Good bowel sounds. Extremities: no cyanosis, clubbing, rash, tr edema Neuro: alert & orientedx3, cranial nerves grossly intact. Moves all 4 extremities w/o difficulty. Affect pleasant.   ASSESSMENT & PLAN: Chronic Systolic Heart Failure  Ischemic cardiomyopathy.  Despite significant deficits on CPX in 4/14, at this point she is doing well functionally. NYHA II.  05/2012 ECHO EF 15%.  Volume status stable.  Has Medtronic ICD, not good candidate for CRT upgrade.  - Continue lasix 40 mg twice a day and Spironolactone 25 mg daily.  - Unable to titrate meds due to soft BP. Continue carvedilol 6.25 mg twice a day and digoxin 0.125 mg daily. Continue lisinopril 5 mg daily - Check BMET/BNP in September CAD No ischemic symptoms.  Continue ASA 81.  I will increase lipitor to 80 mg daily.  Lipids/LFTs in 9/14.  Status post MV repair Stable on most recent echo.  CKD Creatinine has been stable around 1.4.  Marca Ancona 09/27/2012

## 2012-09-27 NOTE — Patient Instructions (Addendum)
Please start Lipitor (Atrovasatin) 80 mg a day Continue all other medications as listed  Please have blood work in September ( BMP, LFT and BNP)  Follow up in 3 months

## 2012-09-29 MED ORDER — ATORVASTATIN CALCIUM 80 MG PO TABS: 80.0000 mg | ORAL_TABLET | Freq: Every day | ORAL | Status: DC

## 2012-09-29 NOTE — Addendum Note (Signed)
Encounter addended by: Rocco Serene, RN on: 09/29/2012 12:29 PM<BR>     Documentation filed: Orders, Patient Instructions Section

## 2012-09-30 ENCOUNTER — Ambulatory Visit (INDEPENDENT_AMBULATORY_CARE_PROVIDER_SITE_OTHER): Payer: Medicare Other | Admitting: Internal Medicine

## 2012-09-30 ENCOUNTER — Encounter: Payer: Self-pay | Admitting: Internal Medicine

## 2012-09-30 VITALS — BP 89/56 | HR 55 | Temp 97.0°F | Wt 173.9 lb

## 2012-09-30 DIAGNOSIS — I959 Hypotension, unspecified: Secondary | ICD-10-CM

## 2012-09-30 DIAGNOSIS — B351 Tinea unguium: Secondary | ICD-10-CM | POA: Insufficient documentation

## 2012-09-30 LAB — COMPLETE METABOLIC PANEL WITH GFR
ALT: 28 U/L (ref 0–35)
Alkaline Phosphatase: 92 U/L (ref 39–117)
Creat: 1.54 mg/dL — ABNORMAL HIGH (ref 0.50–1.10)
GFR, Est African American: 43 mL/min — ABNORMAL LOW
Sodium: 135 mEq/L (ref 135–145)
Total Bilirubin: 0.4 mg/dL (ref 0.3–1.2)
Total Protein: 7.4 g/dL (ref 6.0–8.3)

## 2012-09-30 NOTE — Assessment & Plan Note (Signed)
The patient has mild hypertension today with a HR of 69. The patient is completely asymptomatic and has numerous BPs on record similar to today. She appears to have stable mild hypotension that does nto require modification of her medicines or any other intervention.

## 2012-09-30 NOTE — Assessment & Plan Note (Signed)
A:   The thickened toe nail is most likely 2/2 to a fungal infection (onychomycosis).  P:  As 50% of abnormal toe nails are 2/2 to fungal infection and health insurance companies may not cover antifungal treatment without KOH prep, I have elected to refer the patient to podiatry for management of her likely onychomycosis. The patient will likely be started on a systemic antifungal once the nail biopsy confirms the diagnosis for 6 weeks to 6 months as topical treatments have poor efficacy. Furthermore, I have referred the patient to podiatry for the management of her right foot callus. As a diabetic, the foot care is particularly important. I hope for the podiatrist to provide recommendations about foot ware.

## 2012-09-30 NOTE — Progress Notes (Signed)
Subjective:   Patient ID: [name] female   DOB: 1955-05-10 57 y.o.   MRN: [id number]  HPI: [name] is a 57 y.o. female with a pmhx detailed below who comes to the clinic with a cc of thickened toe nail. The nail on the patients right great toe has been thickened for about a month. The patient recently cut her toenails and found it to be greatly thickened. She had tried cleaning it at home to thin it, but was unsuccessful. The patient denies pain, drainage, redness, fevers, chills, nausea, and vomiting. Of note, the patient also reports a callus on her right foot located at the medial base her right great toe. The patient denies pain at this location, drainage, or warmness.    Past Medical History  Diagnosis Date  . Coronary artery disease   . Diabetes mellitus   . Hypertension   . CHF (congestive heart failure)     Systolic.  EF 10-15%  . Polysubstance abuse     history of  (cocaine, tobacco and ETOH)   Current Outpatient Prescriptions  Medication Sig Dispense Refill  . allopurinol (ZYLOPRIM) 100 MG tablet Take 200 mg po in am and 100 mg po in pm  90 tablet  11  . aspirin 81 MG chewable tablet Chew 81 mg by mouth daily.      [name] atorvastatin (LIPITOR) 80 MG tablet Take 1 tablet (80 mg total) by mouth daily.  30 tablet  11  . carvedilol (COREG) 6.25 MG tablet Take 1 tablet (6.25 mg total) by mouth 2 (two) times daily with a meal.  60 tablet  3  . digoxin (LANOXIN) 0.125 MG tablet TAKE ONE TABLET BY MOUTH ONCE DAILY  30 tablet  6  . furosemide (LASIX) 40 MG tablet TAKE ONE TABLET BY MOUTH TWICE DAILY  60 tablet  6  . glucose blood (ACCU-CHEK INSTANT PLUS TEST) test strip Use to test blood sugars 4 times a day. Insulin dependent. Dx code; 250.02.  120 each  12  . insulin aspart (NOVOLOG FLEXPEN) 100 UNIT/ML injection Her TDD is 40   So take one unit for every 12 CHO.  And give one unit = 40 mg/dl reduction. The goal is CBG of 110  6 mL  12  . insulin glargine (LANTUS  SOLOSTAR) 100 UNIT/ML injection Inject 0.36 mLs (36 Units total) into the skin at bedtime.  12 mL  12  . Insulin Pen Needle (B-D ULTRAFINE III SHORT PEN) 31G X 8 MM MISC 1 Device by Does not apply route as directed.  30 each  12  . lisinopril (PRINIVIL,ZESTRIL) 5 MG tablet TAKE ONE TABLET BY MOUTH TWICE DAILY  60 tablet  3  . spironolactone (ALDACTONE) 25 MG tablet Take 1 tablet (25 mg total) by mouth daily.  30 tablet  5  . nitroGLYCERIN (NITROSTAT) 0.4 MG SL tablet Place 1 tablet (0.4 mg total) under the tongue every 5 (five) minutes as needed. For chest pain.  25 tablet  11  . [DISCONTINUED] sitaGLIPtin (JANUVIA) 25 MG tablet Take 1 tablet (25 mg total) by mouth daily.  30 tablet  1   No current facility-administered medications for this visit.   Family History  Problem Relation Age of Onset  . Heart attack Mother     Died age 31  . Diabetes Maternal Grandmother   . Heart attack Cousin   . Heart attack Maternal Uncle    History   Social History  . Marital  Status: Widowed    Spouse Name: N/A    Number of Children: N/A  . Years of Education: N/A   Occupational History  . disable    Social History Main Topics  . Smoking status: Former Smoker    Quit date: 03/03/2011  . Smokeless tobacco: None  . Alcohol Use: Yes     Comment: Had some drinks New Years, describes social drinkingC  . Drug Use: No     Comment: Has history of cocaine use, denies recent  . Sexually Active: None   Other Topics Concern  . None   Social History Narrative  . None   Review of Systems:  Pertinent items are noted in HPI.  Objective:  Physical Exam: Filed Vitals:   09/30/12 0820 09/30/12 0901  BP: 85/61 89/56  Pulse: 69 55  Temp: 97 F (36.1 C)   TempSrc: Oral   Weight: 173 lb 14.4 oz (78.881 kg)   SpO2: 97%    Physical Exam  Constitutional: She appears well-developed and well-nourished.  HENT:  Head: Normocephalic and atraumatic.  Mouth/Throat: Oropharynx is clear and moist. No  oropharyngeal exudate.  Eyes: EOM are normal. Pupils are equal, round, and reactive to light.  Cardiovascular: Normal rate, regular rhythm, normal heart sounds and intact distal pulses.  Exam reveals no friction rub.   No murmur heard. DP and PT pulse are 2+ bilaterally.  Pulmonary/Chest: Effort normal and breath sounds normal. No respiratory distress. She has no wheezes. She has no rales.  Skin:     There is a moderately thickened toenail of the right great toe. There is a 2 cm callus along the medial aspect of the right forefoot. There is no redness, drainage, or swelling. No tenderness.     Assessment & Plan:

## 2012-09-30 NOTE — Patient Instructions (Addendum)
Please follow up with your podiatrist for the fungal infection of the right great toe and for callus on right foot  We are checking your liver enzymes today, and I will let you know if there are abnormalities  Follow up with PCP in 2-3 months.

## 2012-10-01 NOTE — Progress Notes (Signed)
I saw patient and discussed his care with Dr. Komanski at the time of the visit.  We reviewed the resident's history and exam and pertinent patient test results.  I agree with the assessment, diagnosis, and plan of care documented in the resident's note. 

## 2012-10-19 ENCOUNTER — Telehealth (HOSPITAL_COMMUNITY): Payer: Self-pay | Admitting: Cardiology

## 2012-10-19 ENCOUNTER — Telehealth: Payer: Self-pay | Admitting: *Deleted

## 2012-10-19 NOTE — Telephone Encounter (Signed)
Pt called /informed ok to take Tylenol cold.

## 2012-10-19 NOTE — Telephone Encounter (Signed)
Call from pt - wants to know what she can take OTC for body aches, congestion, temp was 100.9 now 97.5; she's concern b/c of hx of diabetes And heart problems. Is it ok to take Tylenol cold?

## 2012-10-19 NOTE — Telephone Encounter (Signed)
yes

## 2012-10-19 NOTE — Telephone Encounter (Signed)
Spoke w/pt. °

## 2012-10-19 NOTE — Telephone Encounter (Signed)
Pt called to see what otc cold meds she can see with her heart condition. Cough, congestion, fever 101.4 Please advise

## 2012-11-16 ENCOUNTER — Ambulatory Visit (INDEPENDENT_AMBULATORY_CARE_PROVIDER_SITE_OTHER): Payer: Medicare Other | Admitting: *Deleted

## 2012-11-16 DIAGNOSIS — E785 Hyperlipidemia, unspecified: Secondary | ICD-10-CM

## 2012-11-16 DIAGNOSIS — I10 Essential (primary) hypertension: Secondary | ICD-10-CM

## 2012-11-16 DIAGNOSIS — Z0000 Encounter for general adult medical examination without abnormal findings: Secondary | ICD-10-CM

## 2012-11-16 DIAGNOSIS — Z79899 Other long term (current) drug therapy: Secondary | ICD-10-CM

## 2012-11-16 DIAGNOSIS — R0602 Shortness of breath: Secondary | ICD-10-CM

## 2012-11-16 LAB — HEPATIC FUNCTION PANEL
Albumin: 4.1 g/dL (ref 3.5–5.2)
Bilirubin, Direct: 0.1 mg/dL (ref 0.0–0.3)
Total Bilirubin: 0.5 mg/dL (ref 0.3–1.2)

## 2012-11-16 LAB — BASIC METABOLIC PANEL
BUN: 48 mg/dL — ABNORMAL HIGH (ref 6–23)
CO2: 28 mEq/L (ref 19–32)
Chloride: 101 mEq/L (ref 96–112)
Creatinine, Ser: 1.5 mg/dL — ABNORMAL HIGH (ref 0.4–1.2)
Potassium: 4 mEq/L (ref 3.5–5.1)

## 2012-12-08 ENCOUNTER — Other Ambulatory Visit (HOSPITAL_COMMUNITY): Payer: Self-pay | Admitting: Cardiology

## 2012-12-08 DIAGNOSIS — I5022 Chronic systolic (congestive) heart failure: Secondary | ICD-10-CM

## 2012-12-08 MED ORDER — LISINOPRIL 5 MG PO TABS: 5.0000 mg | ORAL_TABLET | Freq: Two times a day (BID) | ORAL | Status: DC

## 2012-12-08 NOTE — Telephone Encounter (Signed)
Requested Prescriptions   Signed Prescriptions Disp Refills  . lisinopril (PRINIVIL,ZESTRIL) 5 MG tablet 60 tablet 3    Sig: Take 1 tablet (5 mg total) by mouth 2 (two) times daily.    Authorizing Provider: Dolores Patty    Ordering User: JEFFRIES, Milagros Reap

## 2012-12-10 ENCOUNTER — Other Ambulatory Visit (HOSPITAL_COMMUNITY): Payer: Self-pay | Admitting: Cardiology

## 2012-12-10 DIAGNOSIS — I5022 Chronic systolic (congestive) heart failure: Secondary | ICD-10-CM

## 2012-12-10 MED ORDER — CARVEDILOL 6.25 MG PO TABS: 6.2500 mg | ORAL_TABLET | Freq: Two times a day (BID) | ORAL | Status: DC

## 2012-12-10 MED ORDER — ALLOPURINOL 100 MG PO TABS: ORAL_TABLET | ORAL | Status: DC

## 2012-12-10 MED ORDER — SPIRONOLACTONE 25 MG PO TABS: 25.0000 mg | ORAL_TABLET | Freq: Every day | ORAL | Status: DC

## 2012-12-10 NOTE — Telephone Encounter (Signed)
Requested Prescriptions   Signed Prescriptions Disp Refills  . spironolactone (ALDACTONE) 25 MG tablet 30 tablet 3    Sig: Take 1 tablet (25 mg total) by mouth daily.    Authorizing Provider: Dolores Patty    Ordering User: JEFFRIES, CHANTEL M  . carvedilol (COREG) 6.25 MG tablet 60 tablet 3    Sig: Take 1 tablet (6.25 mg total) by mouth 2 (two) times daily with a meal.    Authorizing Provider: Dolores Patty    Ordering User: JEFFRIES, CHANTEL M  . allopurinol (ZYLOPRIM) 100 MG tablet 90 tablet 3    Sig: Take 200 mg po in am and 100 mg po in pm    Authorizing Provider: Dolores Patty    Ordering User: JEFFRIES, Milagros Reap

## 2012-12-13 ENCOUNTER — Encounter: Payer: Self-pay | Admitting: *Deleted

## 2012-12-27 ENCOUNTER — Encounter: Payer: Self-pay | Admitting: *Deleted

## 2012-12-30 ENCOUNTER — Ambulatory Visit (INDEPENDENT_AMBULATORY_CARE_PROVIDER_SITE_OTHER): Payer: Medicare Other | Admitting: Internal Medicine

## 2012-12-30 VITALS — BP 100/70 | Wt 177.0 lb

## 2012-12-30 DIAGNOSIS — E1165 Type 2 diabetes mellitus with hyperglycemia: Secondary | ICD-10-CM

## 2012-12-30 DIAGNOSIS — Z23 Encounter for immunization: Secondary | ICD-10-CM

## 2012-12-30 DIAGNOSIS — E785 Hyperlipidemia, unspecified: Secondary | ICD-10-CM

## 2012-12-30 LAB — GLUCOSE, CAPILLARY: Glucose-Capillary: 129 mg/dL — ABNORMAL HIGH (ref 70–99)

## 2012-12-30 MED ORDER — INSULIN ASPART 100 UNIT/ML ~~LOC~~ SOLN: 5.0000 [IU] | Freq: Three times a day (TID) | SUBCUTANEOUS | Status: DC

## 2012-12-30 NOTE — Progress Notes (Signed)
Patient ID: [name], female   DOB: [birth year], 57 y.o.   MRN: [id number] HPI: 57 year old female with history of HTN, DM2, polysubstance abuse (ETOH, tobacco, cocaine) and PAD. 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 At time of cath LAD, LCX and RCA occluded. EF 45%. Had abrupt stent occlusion the next day and had to go back to the lab. 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   ECHO 2011 EF 20-25% -> Medtronic ICD placed.  [facility] 03/17/11  RA = 8  RV = 47/1/9  PA = 52/12 (28)  PCW = 20 no significant v-waves  Fick cardiac output/index = 4.2/2.2  PVR =1.6 [place]  FA sat = 95%  PA sat = 61%, 63%  Ao Pressure: 89/58 (71)  LV Pressure: 86/12/24  Severe native CAD with all bypass grafts patent   Echo 03/2011: Severe LV dysfunction EF 10-15%   04/10/11: CPX (on Carvedilol)  Peak VO2: 13.6 ml/kg/min predicted peak VO2: 63.3% VE/VCO2 slope: 44.9 OUES: 1.11Peak RER: 1.12   11/17/11 ECHO EF 25-30% 05/19/2012 ECHO EF 15%  CPX: 06/17/12 Peak VO2 14.8 (predicted peak VO2 68.5%), VE/VCO2 slope 43.4 OUES: 1.19, Peak RER 1.12 Vent threshold 11.3 (pred peak VO2 52.3%)  She returns for follow up today.  Denies SOB/PND/Orthopnea. Rides the stationary bike 45 minutes at a time 3 times. Weight at home 175 pounds which is up a few pounds.  Remains alcohol and drug free (over 2 years).  Compliant with medications. Following low salt diet but has bee snaking a lot.   08/12/12 - on lipitor 40 mg daily Cholesterol 180 Triglyceride 195 HDL 29 LDL 112 K 4.5, creatinine 1.4 11/16/12 K 4.0 Labs 1.5 Pro BNP 181  ROS: All systems negative except as listed in HPI, PMH and Problem List.  Past Medical History  Diagnosis Date  . Coronary artery disease   . Diabetes mellitus   . Hypertension   . CHF (congestive heart failure)     Systolic.  EF 10-15%  . Polysubstance abuse     history of  (cocaine, tobacco and ETOH)     Current Outpatient Prescriptions  Medication Sig Dispense Refill  . allopurinol (ZYLOPRIM) 100 MG tablet Take 200 mg po in am and 100 mg po in pm  90 tablet  3  . aspirin 81 MG chewable tablet Chew 81 mg by mouth daily.      [name] atorvastatin (LIPITOR) 80 MG tablet Take 1 tablet (80 mg total) by mouth daily.  30 tablet  11  . carvedilol (COREG) 6.25 MG tablet Take 1 tablet (6.25 mg total) by mouth 2 (two) times daily with a meal.  60 tablet  3  . digoxin (LANOXIN) 0.125 MG tablet TAKE ONE TABLET BY MOUTH ONCE DAILY  30 tablet  6  . furosemide (LASIX) 40 MG tablet TAKE ONE TABLET BY MOUTH TWICE DAILY  60 tablet  6  . glucose blood (ACCU-CHEK INSTANT PLUS TEST) test strip Use to test blood sugars 4 times a day. Insulin dependent. Dx code; 250.02.  120 each  12  . insulin aspart (NOVOLOG FLEXPEN) 100 UNIT/ML injection Inject 5 Units into the skin 3 (three) times daily before meals.  10 mL  12  . insulin glargine (LANTUS SOLOSTAR) 100 UNIT/ML injection Inject 0.36 mLs (36 Units total) into the skin at bedtime.  12 mL  12  . Insulin  Pen Needle (B-D ULTRAFINE III SHORT PEN) 31G X 8 MM MISC 1 Device by Does not apply route as directed.  30 each  12  . lisinopril (PRINIVIL,ZESTRIL) 5 MG tablet Take 1 tablet (5 mg total) by mouth 2 (two) times daily.  60 tablet  3  . spironolactone (ALDACTONE) 25 MG tablet Take 1 tablet (25 mg total) by mouth daily.  30 tablet  3  . nitroGLYCERIN (NITROSTAT) 0.4 MG SL tablet Place 1 tablet (0.4 mg total) under the tongue every 5 (five) minutes as needed. For chest pain.  25 tablet  11  . [DISCONTINUED] sitaGLIPtin (JANUVIA) 25 MG tablet Take 1 tablet (25 mg total) by mouth daily.  30 tablet  1   No current facility-administered medications for this encounter.     PHYSICAL EXAM: Filed Vitals:   12/31/12 0843  BP: 90/58  Pulse: 77  Weight: 177 lb (80.287 kg)  SpO2: 95%    General:  Well appearing. No resp difficulty HEENT: normal Neck: supple. JVP 5-6  Carotids 2+ bilaterally; no bruits. No lymphadenopathy or thryomegaly appreciated. Cor: PMI normal. Regular rate & rhythm. 2/6 TR Lungs: clear Abdomen: soft, nontender, mildly distended. No hepatosplenomegaly. No bruits or masses. Good bowel sounds. Extremities: no cyanosis, clubbing, rash, edema Neuro: alert & orientedx3, cranial nerves grossly intact. Moves all 4 extremities w/o difficulty. Affect pleasant.   ASSESSMENT & PLAN: 1. Chronic Systolic Heart Failure ICM. Has Medtronic ICD, not good candidate for CRT upgrade. CPX: 06/17/12 Peak VO2 14.8 (predicted peak VO2 68.5%), VE/VCO2 slope 43.4 OUES: 1.19, Peak RER 1.12  05/2012 ECHO EF 15%. NYHA II.  Functionally she can do whatever she wants but has mild dyspnea on occasion.  - Volume status stable despite weight gain.  Continue lasix 40 mg twice a day and Spironolactone 25 mg daily.  - Unable to titrate meds due to soft BP. Continue carvedilol 6.25 mg twice a day and digoxin 0.125 mg daily. Continue lisinopril 5 mg daily. -Reinforced daily weights, medication compliance, and low salt food choices.  2. CAD-No ischemic symptoms.  Continue ASA 81.  I will increase lipitor to 80 mg daily.  Lipids/LFTs in 9/14.  3. Status post MV repair-Stable on most recent echo.  4.CKD-  Reviewed BMET from 11/16/12 Creatinine stable within her baseline 1.4-1.5.  Follow up in 4 months   CLEGG,AMY 12/31/2012

## 2012-12-30 NOTE — Patient Instructions (Signed)
General Instructions: 1. Please stop midnight snacks 2. Continue Lantus 36 units at bedtime 3. Change your novolog to 5 units three times daily at meals. And sliding scale for every 49 mg cbgs above 110, give 1 unit Novolog. 4. Follow up in Dec 5. Will send for Podiatry referral.    Treatment Goals:  Goals (1 Years of Data) as of 12/30/12         As of Today 09/30/12 09/30/12 09/27/12 09/06/12     Blood Pressure    . Blood Pressure < 140/90  100/70 89/56 85/61  98/58 91/51    . Blood Pressure < 140/90  100/70 89/56 85/61  98/58 91/51     Result Component    . HEMOGLOBIN A1C < 7.0  7.4        . HEMOGLOBIN A1C < 7.0  7.4        . LDL CALC < 100          . LDL CALC < 70            Progress Toward Treatment Goals:  Treatment Goal 12/30/2012  Hemoglobin A1C improved  Blood pressure at goal    Self Care Goals & Plans:  Self Care Goal 12/30/2012  Manage my medications -  Monitor my health keep track of my blood glucose  Eat healthy foods -  Be physically active -  Meeting treatment goals maintain the current self-care plan    Home Blood Glucose Monitoring 12/30/2012  Check my blood sugar 3 times a day  When to check my blood sugar before breakfast; before lunch; before dinner     Care Management & Community Referrals:  Referral 12/30/2012  Referrals made for care management support diabetes educator  Referrals made to community resources -

## 2012-12-31 ENCOUNTER — Ambulatory Visit (HOSPITAL_COMMUNITY)
Admission: RE | Admit: 2012-12-31 | Discharge: 2012-12-31 | Disposition: A | Discharge status: Home / Self Care | Payer: Medicare Other | Source: Ambulatory Visit | Attending: Internal Medicine | Admitting: Internal Medicine

## 2012-12-31 VITALS — BP 90/58 | HR 77 | Wt 177.0 lb

## 2012-12-31 DIAGNOSIS — I2510 Atherosclerotic heart disease of native coronary artery without angina pectoris: Secondary | ICD-10-CM

## 2012-12-31 DIAGNOSIS — N184 Chronic kidney disease, stage 4 (severe): Secondary | ICD-10-CM | POA: Insufficient documentation

## 2012-12-31 DIAGNOSIS — I5022 Chronic systolic (congestive) heart failure: Secondary | ICD-10-CM | POA: Diagnosis present

## 2012-12-31 NOTE — Progress Notes (Signed)
Subjective:   Patient ID: [name] female   DOB: [birth year] 57 y.o.   MRN: [id number] Chief complaints: follow up  HPI:  [name] is a 57 y.o. 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 She is here for DM follow up.   # DM type 2     She denies thirst, polydipsia and polyuria. Denies blurry vision. States that she is doing fine. She admits dietary indiscretion. Reports that she recently starts to eat bedtime snacks and sometimes eats in the middle of nights. She reports that she doe not calculate her carbs consistently and does not give herself Novolog prandial coverages consistently. She occasionally miss bedtime Lantus dose due to misunderstanding of the effect from basal insulin coverage. She thinks that the Lantus will decrease her bedtime blood sugars.  She brought her meter today.  .  Past Medical History  Diagnosis Date  . Coronary artery disease   . Diabetes mellitus   . Hypertension   . CHF (congestive heart failure)     Systolic.  EF 10-15%  . Polysubstance abuse     history of  (cocaine, tobacco and ETOH)   Current Outpatient Prescriptions  Medication Sig Dispense Refill  . allopurinol (ZYLOPRIM) 100 MG tablet Take 200 mg po in am and 100 mg po in pm  90 tablet  3  . aspirin 81 MG chewable tablet Chew 81 mg by mouth daily.      [name] atorvastatin (LIPITOR) 80 MG tablet Take 1 tablet (80 mg total) by mouth daily.  30 tablet  11  . carvedilol (COREG) 6.25 MG tablet Take 1 tablet (6.25 mg total) by mouth 2 (two) times daily with a meal.  60 tablet  3  . digoxin (LANOXIN) 0.125 MG tablet TAKE ONE TABLET BY MOUTH ONCE DAILY  30 tablet  6  . furosemide (LASIX) 40 MG tablet TAKE ONE TABLET BY MOUTH TWICE DAILY  60 tablet  6  . glucose blood (ACCU-CHEK INSTANT PLUS TEST) test strip Use to test blood sugars 4 times a day. Insulin dependent. Dx  code; 250.02.  120 each  12  . insulin aspart (NOVOLOG FLEXPEN) 100 UNIT/ML injection Inject 5 Units into the skin 3 (three) times daily before meals.  10 mL  12  . insulin glargine (LANTUS SOLOSTAR) 100 UNIT/ML injection Inject 0.36 mLs (36 Units total) into the skin at bedtime.  12 mL  12  . Insulin Pen Needle (B-D ULTRAFINE III SHORT PEN) 31G X 8 MM MISC 1 Device by Does not apply route as directed.  30 each  12  . lisinopril (PRINIVIL,ZESTRIL) 5 MG tablet Take 1 tablet (5 mg total) by mouth 2 (two) times daily.  60 tablet  3  . nitroGLYCERIN (NITROSTAT) 0.4 MG SL tablet Place 1 tablet (0.4 mg total) under the tongue every 5 (five) minutes as needed. For chest pain.  25 tablet  11  . spironolactone (ALDACTONE) 25 MG tablet Take 1 tablet (25 mg total) by mouth daily.  30 tablet  3  . [DISCONTINUED] sitaGLIPtin (JANUVIA) 25 MG tablet Take 1 tablet (25 mg total) by mouth daily.  30 tablet  1   No current facility-administered medications for this visit.   Family History  Problem Relation Age of Onset  . Heart attack Mother     Died age 70  . Diabetes Maternal Grandmother   .  Heart attack Cousin   . Heart attack Maternal Uncle    History   Social History  . Marital Status: Widowed    Spouse Name: N/A    Number of Children: N/A  . Years of Education: N/A   Occupational History  . disable    Social History Main Topics  . Smoking status: Former Smoker    Quit date: 03/03/2011  . Smokeless tobacco: Not on file  . Alcohol Use: Yes     Comment: Had some drinks New Years, describes social drinkingC  . Drug Use: No     Comment: Has history of cocaine use, denies recent  . Sexual Activity: Not on file   Other Topics Concern  . Not on file   Social History Narrative  . No narrative on file   Review of Systems: Review of Systems:  Constitutional:  Denies fever, chills, diaphoresis, appetite change and fatigue.   HEENT:  Denies congestion, sore throat, rhinorrhea, sneezing, mouth  sores, trouble swallowing, neck pain   Respiratory:  Denies SOB, DOE, cough, and wheezing.   Cardiovascular:  Denies palpitations and leg swelling.   Gastrointestinal:  Denies nausea, vomiting, abdominal pain, diarrhea, constipation, blood in stool and abdominal distention.   Genitourinary:  Denies dysuria, urgency, frequency, hematuria, flank pain and difficulty urinating.   Musculoskeletal:  Denies myalgias, back pain, joint swelling, arthralgias and gait problem.   Skin:  Denies pallor, rash and wound.   Neurological:  Denies dizziness, seizures, syncope, weakness, light-headedness, numbness and headaches.    .    Objective:  Physical Exam: Filed Vitals:   12/30/12 1554  BP: 100/70  Weight: 177 lb (80.287 kg)  SpO2: 100%   General: NAD Neck: supple, full ROM, no thyromegaly, no JVD.  Lungs: CTA B/L Heart: RRR, No M/G/R Abdomen: soft, non-tender, normal bowel sounds, no distention, no guarding, no rebound tenderness. Msk: no joint warmth, and no redness over joints.   Pulses: 2+ DP/PT pulses bilaterally Extremities: No cyanosis, clubbing, edema Neurologic: alert & oriented X3.    Assessment & Plan:

## 2012-12-31 NOTE — Patient Instructions (Signed)
   Follow up in 4 months  Do the following things EVERYDAY: 1) Weigh yourself in the morning before breakfast. Write it down and keep it in a log. 2) Take your medicines as prescribed 3) Eat low salt foods-Limit salt (sodium) to 2000 mg per day.  4) Stay as active as you can everyday 5) Limit all fluids for the day to less than 2 liters 

## 2012-12-31 NOTE — Progress Notes (Signed)
Case discussed with Dr. Li soon after the resident saw the patient. We reviewed the resident's history and exam and pertinent patient test results. I agree with the assessment, diagnosis, and plan of care documented in the resident's note. 

## 2012-12-31 NOTE — Assessment & Plan Note (Addendum)
Lab Results  Component Value Date   HGBA1C 7.4 12/30/2012   HGBA1C 7.3 08/19/2012   HGBA1C 7.8 05/13/2012     Assessment: Diabetes control: good control (HgbA1C at goal) Progress toward A1C goal:  improved   Plan: Medications:  see below Home glucose monitoring: Frequency: 3 times a day Timing: before breakfast;before lunch;before dinner Instruction/counseling given: reminded to get eye exam, reminded to bring blood glucose meter & log to each visit, reminded to bring medications to each visit, discussed foot care and discussed the need for weight loss Educational resources provided:   Self management tools provided:    Assessment: Patient is a 57 year old woman with a PMH of significant cardiac disease managed by [facility] and uncontrolled DM. Her DM is better since she started to follow up with our clinic. Her HGB A1c is as follows.   Ref. Range 01/27/2012 11:35 05/13/2012 15:14 08/19/2012 16:02 12/30/2012 15:59  Hemoglobin A1C Latest Range: <5.7 % 9.6 (H) 7.8 7.3 7.4             Her meter is downloaded and reviewed today. She has persistent elevated am CBGS at 170-190's and post prandial CBGs. She admits dietary indiscretion and started night time snacks recently. She also reports missing Novolog Prandial coverage since she fails to count her Carbs.  Of note, she had extensive education by me and our clinic DM educator and she chose to do Carb counting for prandial CBGs management.   Plan 1. Will continue current basal coverage Lantus 36 units since the am fasting CBGS are not true fasting given her new habit of bedtime/midnight snacking.  2. With regards to prandial coverage, she states that she does not want to calculate Carbs anymore.   Will give her fixed prandial coverage based on her dietary intake. She reports average of 60 gm per meals.  Novolog 5 units tid with meals.  SSI: Goal of 110 and one unit additional Novolog for CBGS of every 40 mg/dl above 161.  This  plan will roughly give her ~8 units Novolog per meal= 24 units per day, which I think that we still have rooms to increase her prandial Novolog coverage  3. Instruct patient to stop bedtime/midnight snacking unless her CBGS are low.      Instruct patient to be more complaint with her Novolog prandial coverage.      Instruct patient not to skip Lantus.                          4. The goal is to lower her HGB A1C to less than 7, which is a very realistic goal for her.  I think that we are close to her optimal DM control with above plan.             5. Follow up in one month after she changes her dietary indiscretion and is more compliant with her novolog.             6. She has some feet callous and will send for Podiatry referral.

## 2012-12-31 NOTE — Assessment & Plan Note (Signed)
She takes high potent Statin and LDL is at goal yet.  - will need to repeat her lipid panel next OV.

## 2013-01-03 ENCOUNTER — Other Ambulatory Visit: Payer: Self-pay | Admitting: *Deleted

## 2013-01-03 DIAGNOSIS — E1165 Type 2 diabetes mellitus with hyperglycemia: Secondary | ICD-10-CM

## 2013-01-03 MED ORDER — INSULIN ASPART 100 UNIT/ML ~~LOC~~ SOLN: 5.0000 [IU] | Freq: Three times a day (TID) | SUBCUTANEOUS | Status: DC

## 2013-01-03 NOTE — Telephone Encounter (Signed)
i realize you sent the script in but must have diag code on it, so will need to do over again

## 2013-01-04 NOTE — Telephone Encounter (Signed)
Will need PA, should have been 15ml for amt

## 2013-01-07 ENCOUNTER — Telehealth: Payer: Self-pay | Admitting: Dietician

## 2013-01-07 NOTE — Telephone Encounter (Signed)
PA for Novolog Flexpen called to [facility] It was sent to review we'll hear in 72 hours. Humalog Stephanie Coup is covered with no PA and went through.

## 2013-02-14 ENCOUNTER — Encounter: Payer: Self-pay | Admitting: Internal Medicine

## 2013-02-14 ENCOUNTER — Ambulatory Visit (INDEPENDENT_AMBULATORY_CARE_PROVIDER_SITE_OTHER): Payer: Medicare Other | Admitting: Internal Medicine

## 2013-02-14 VITALS — BP 87/55 | HR 52 | Temp 97.2°F | Ht 64.0 in | Wt 179.9 lb

## 2013-02-14 DIAGNOSIS — E1165 Type 2 diabetes mellitus with hyperglycemia: Secondary | ICD-10-CM

## 2013-02-14 DIAGNOSIS — I5022 Chronic systolic (congestive) heart failure: Secondary | ICD-10-CM

## 2013-02-14 DIAGNOSIS — I255 Ischemic cardiomyopathy: Secondary | ICD-10-CM

## 2013-02-14 DIAGNOSIS — I2589 Other forms of chronic ischemic heart disease: Secondary | ICD-10-CM

## 2013-02-14 LAB — TSH: TSH: 2.97 u[IU]/mL (ref 0.350–4.500)

## 2013-02-14 LAB — BASIC METABOLIC PANEL WITH GFR
CO2: 32 mEq/L (ref 19–32)
Calcium: 10.2 mg/dL (ref 8.4–10.5)
GFR, Est African American: 52 mL/min — ABNORMAL LOW
Sodium: 141 mEq/L (ref 135–145)

## 2013-02-14 MED ORDER — INSULIN LISPRO 100 UNIT/ML (KWIKPEN): 5.0000 [IU] | PEN_INJECTOR | Freq: Three times a day (TID) | SUBCUTANEOUS | Status: DC

## 2013-02-14 NOTE — Patient Instructions (Signed)
1. Will continue current regimen 2. Will check your blood today 3. Please follow up in 1-2 months 4. Please see Lupita Leash.   Diets for Diabetes, Food Labeling Look at food labels to help you decide how much of a product you can eat. You will want to check the amount of total carbohydrate in a serving to see how the food fits into your meal plan. In the list of ingredients, the ingredient present in the largest amount by weight must be listed first, followed by the other ingredients in descending order. STANDARD OF IDENTITY Most products have a list of ingredients. However, foods that the Food and Drug Administration (FDA) has given a standard of identity do not need a list of ingredients. A standard of identity means that a food must contain certain ingredients if it is called a particular name. Examples are mayonnaise, peanut butter, ketchup, jelly, and cheese. LABELING TERMS There are many terms found on food labels. Some of these terms have specific definitions. Some terms are regulated by the FDA, and the FDA has clearly specified how they can be used. Others are not regulated or well-defined and can be misleading and confusing. SPECIFICALLY DEFINED TERMS Nutritive Sweetener.  A sweetener that contains calories,such as table sugar or honey. Nonnutritive Sweetener.  A sweetener with few or no calories,such as saccharin, aspartame, sucralose, and cyclamate. LABELING TERMS REGULATED BY THE FDA Free.  The product contains only a tiny or small amount of fat, cholesterol, sodium, sugar, or calories. For example, a "fat-free" product will contain less than 0.5 g of fat per serving. Low.  A food described as "low" in fat, saturated fat, cholesterol, sodium, or calories could be eaten fairly often without exceeding dietary guidelines. For example, "low in fat" means no more than 3 g of fat per serving. Lean.  "Lean" and "extra lean" are U.S. Department of Agriculture Architect) terms for use on meat  and poultry products. "Lean" means the product contains less than 10 g of fat, 4 g of saturated fat, and 95 mg of cholesterol per serving. "Lean" is not as low in fat as a product labeled "low." Extra Lean.  "Extra lean" means the product contains less than 5 g of fat, 2 g of saturated fat, and 95 mg of cholesterol per serving. While "extra lean" has less fat than "lean," it is still higher in fat than a product labeled "low." Reduced, Less, Fewer.  A diet product that contains 25% less of a nutrient or calories than the regular version. For example, hot dogs might be labeled "25% less fat than our regular hot dogs." Light/Lite.  A diet product that contains  fewer calories or  the fat of the original. For example, "light in sodium" means a product with  the usual sodium. More.  One serving contains at least 10% more of the daily value of a vitamin, mineral, or fiber than usual. Good Source Of.  One serving contains 10% to 19% of the daily value for a particular vitamin, mineral, or fiber. Excellent Source Of.  One serving contains 20% or more of the daily value for a particular nutrient. Other terms used might be "high in" or "rich in." Enriched or Fortified.  The product contains added vitamins, minerals, or protein. Nutrition labeling must be used on enriched or fortified foods. Imitation.  The product has been altered so that it is lower in protein, vitamins, or minerals than the usual food,such as imitation peanut butter. Total Fat.  The number  listed is the total of all fat found in a serving of the product. Under total fat, food labels must list saturated fat and trans fat, which are associated with raising bad cholesterol and an increased risk of heart blood vessel disease. Saturated Fat.  Mainly fats from animal-based sources. Some examples are red meat, cheese, cream, whole milk, and coconut oil. Trans Fat.  Found in some fried snack foods, packaged foods, and fried  restaurant foods. It is recommended you eat as close to 0 g of trans fat as possible, since it raises bad cholesterol and lowers good cholesterol. Polyunsaturated and Monounsaturated Fats.  More healthful fats. These fats are from plant sources. Total Carbohydrate.  The number of carbohydrate grams in a serving of the product. Under total carbohydrate are listed the other carbohydrate sources, such as dietary fiber and sugars. Dietary Fiber.  A carbohydrate from plant sources. Sugars.  Sugars listed on the label contain all naturally occurring sugars as well as added sugars. LABELING TERMS NOT REGULATED BY THE FDA Sugarless.  Table sugar (sucrose) has not been added. However, the manufacturer may use another form of sugar in place of sucrose to sweeten the product. For example, sugar alcohols are used to sweeten foods. Sugar alcohols are a form of sugar but are not table sugar. If a product contains sugar alcohols in place of sucrose, it can still be labeled "sugarless." Low Salt, Salt-Free, Unsalted, No Salt, No Salt Added, Without Added Salt.  Food that is usually processed with salt has been made without salt. However, the food may contain sodium-containing additives, such as preservatives, leavening agents, or flavorings. Natural.  This term has no legal meaning. Organic.  Foods that are certified as organic have been inspected and approved by the USDA to ensure they are produced without pesticides, fertilizers containing synthetic ingredients, bioengineering, or ionizing radiation. Document Released: 02/20/2003 Document Revised: 05/12/2011 Document Reviewed: 09/07/2008 [facility] Patient Information 2014 [place], [place].

## 2013-02-15 LAB — DIGOXIN LEVEL: Digoxin Level: 1.8 ng/mL (ref 0.8–2.0)

## 2013-02-15 LAB — MICROALBUMIN / CREATININE URINE RATIO: Microalb, Ur: 1.02 mg/dL (ref 0.00–1.89)

## 2013-02-15 NOTE — Progress Notes (Signed)
Subjective:   Patient ID: [name] female   DOB: 1956/02/11 57 y.o.   MRN: [id number] Chief complaints: follow up  HPI:  [name] is a 57 y.o. 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 She is here for DM follow up.   # DM type 2     States that she is doing fine. Denies thirst, polydipsia and polyuria. Denies blurry vision. Denies hypoglycemic events.  She admits dietary indiscretion. Reports that she eats more snacks and has not used her Novolog pen since she is unable to fill the Rx due to the insurance issues.    Past Medical History  Diagnosis Date  . Coronary artery disease   . Diabetes mellitus   . Hypertension   . CHF (congestive heart failure)     Systolic.  EF 10-15%  . Polysubstance abuse     history of  (cocaine, tobacco and ETOH)   Current Outpatient Prescriptions  Medication Sig Dispense Refill  . allopurinol (ZYLOPRIM) 100 MG tablet Take 200 mg po in am and 100 mg po in pm  90 tablet  3  . aspirin 81 MG chewable tablet Chew 81 mg by mouth daily.      [name] atorvastatin (LIPITOR) 80 MG tablet Take 1 tablet (80 mg total) by mouth daily.  30 tablet  11  . carvedilol (COREG) 6.25 MG tablet Take 1 tablet (6.25 mg total) by mouth 2 (two) times daily with a meal.  60 tablet  3  . digoxin (LANOXIN) 0.125 MG tablet TAKE ONE TABLET BY MOUTH ONCE DAILY  30 tablet  6  . furosemide (LASIX) 40 MG tablet TAKE ONE TABLET BY MOUTH TWICE DAILY  60 tablet  6  . glucose blood (ACCU-CHEK INSTANT PLUS TEST) test strip Use to test blood sugars 4 times a day. Insulin dependent. Dx code; 250.02.  120 each  12  . insulin aspart (NOVOLOG FLEXPEN) 100 UNIT/ML injection Inject 5 Units into the skin 3 (three) times daily before meals. diag code 250.02. Insulin dependent  10 mL  12  . insulin glargine (LANTUS SOLOSTAR) 100 UNIT/ML injection Inject 0.36 mLs (36  Units total) into the skin at bedtime.  12 mL  12  . Insulin Pen Needle (B-D ULTRAFINE III SHORT PEN) 31G X 8 MM MISC 1 Device by Does not apply route as directed.  30 each  12  . lisinopril (PRINIVIL,ZESTRIL) 5 MG tablet Take 1 tablet (5 mg total) by mouth 2 (two) times daily.  60 tablet  3  . nitroGLYCERIN (NITROSTAT) 0.4 MG SL tablet Place 1 tablet (0.4 mg total) under the tongue every 5 (five) minutes as needed. For chest pain.  25 tablet  11  . spironolactone (ALDACTONE) 25 MG tablet Take 1 tablet (25 mg total) by mouth daily.  30 tablet  3  . insulin lispro (HUMALOG PEN) 100 UNIT/ML SOPN Inject 5 Units into the skin 3 (three) times daily with meals.  15 mL  11  . [DISCONTINUED] sitaGLIPtin (JANUVIA) 25 MG tablet Take 1 tablet (25 mg total) by mouth daily.  30 tablet  1   No current facility-administered medications for this visit.   Family History  Problem Relation Age of Onset  . Heart attack Mother     Died age 32  . Diabetes Maternal Grandmother   . Heart attack Cousin   . Heart attack  Maternal Uncle    History   Social History  . Marital Status: Widowed    Spouse Name: N/A    Number of Children: N/A  . Years of Education: N/A   Occupational History  . disable    Social History Main Topics  . Smoking status: Former Smoker    Quit date: 03/03/2011  . Smokeless tobacco: None  . Alcohol Use: Yes     Comment: Had some drinks New Years, describes social drinkingC  . Drug Use: No     Comment: Has history of cocaine use, denies recent  . Sexual Activity: None   Other Topics Concern  . None   Social History Narrative  . None   Review of Systems: Review of Systems:  Constitutional:  Denies fever, chills, diaphoresis, appetite change and fatigue.   HEENT:  Denies congestion, sore throat, rhinorrhea, sneezing, mouth sores, trouble swallowing, neck pain   Respiratory:  Denies SOB, DOE, cough, and wheezing.   Cardiovascular:  Denies palpitations and leg swelling.    Gastrointestinal:  Denies nausea, vomiting, abdominal pain, diarrhea, constipation, blood in stool and abdominal distention.   Genitourinary:  Denies dysuria, urgency, frequency, hematuria, flank pain and difficulty urinating.   Musculoskeletal:  Denies myalgias, back pain, joint swelling, arthralgias and gait problem.   Skin:  Denies pallor, rash and wound.   Neurological:  Denies dizziness, seizures, syncope, weakness, light-headedness, numbness and headaches.    .    Objective:  Physical Exam: Filed Vitals:   02/14/13 0942  BP: 87/55  Pulse: 52  Temp: 97.2 F (36.2 C)  TempSrc: Oral  Height: 5\' 4"  (1.626 m)  Weight: 179 lb 14.4 oz (81.602 kg)  SpO2: 96%   General: NAD Neck: supple, full ROM, no thyromegaly, no JVD.  Lungs: CTA B/L Heart: RRR, No M/G/R Abdomen: soft, non-tender, normal bowel sounds, no distention, no guarding, no rebound tenderness. Msk: no joint warmth, and no redness over joints.   Pulses: 2+ DP/PT pulses bilaterally Extremities: No cyanosis, clubbing, edema Neurologic: alert & oriented X3.    Assessment & Plan:

## 2013-02-15 NOTE — Assessment & Plan Note (Addendum)
Assessment: Euvolumic. Reports compliance with her tx and cardiology followups. She has baseline asymptomatic low BP.  Plan - will repeat Digi level

## 2013-02-15 NOTE — Assessment & Plan Note (Addendum)
Lab Results  Component Value Date   HGBA1C 7.4 12/30/2012   HGBA1C 7.3 08/19/2012   HGBA1C 7.8 05/13/2012     Assessment: Diabetes control: fair control Progress toward A1C goal:  unchanged  Plan: Medications:  continue current medications Home glucose monitoring: Frequency:   Timing:   Instruction/counseling given: reminded to get eye exam, reminded to bring blood glucose meter & log to each visit, reminded to bring medications to each visit, discussed foot care, discussed the need for weight loss, discussed diet and discussed sick day management Educational resources provided:   Self management tools provided:   Other plans:   1. Will continue the current regimen 2. Will contact her pharmacy and insurance company-- will change prandial coverage to Humalog Pen 5 TID. 3. She will benefit from DM education especially dietary restriction. 4. Will ask pt to scheduled an appt with Lupita Leash.   5. Will check her routine lab urine microalbumin, BMP, TSH

## 2013-02-16 NOTE — Progress Notes (Signed)
Case discussed with Dr. Li at the time of the visit.  We reviewed the resident's history and exam and pertinent patient test results.  I agree with the assessment, diagnosis, and plan of care documented in the resident's note. 

## 2013-02-21 ENCOUNTER — Other Ambulatory Visit: Payer: Self-pay | Admitting: *Deleted

## 2013-02-21 MED ORDER — INSULIN PEN NEEDLE 31G X 8 MM MISC: 1.0000 | Status: DC

## 2013-03-07 ENCOUNTER — Encounter: Payer: Self-pay | Admitting: Internal Medicine

## 2013-03-07 DIAGNOSIS — M2010 Hallux valgus (acquired), unspecified foot: Secondary | ICD-10-CM | POA: Insufficient documentation

## 2013-03-07 DIAGNOSIS — E1140 Type 2 diabetes mellitus with diabetic neuropathy, unspecified: Secondary | ICD-10-CM | POA: Insufficient documentation

## 2013-03-07 DIAGNOSIS — L84 Corns and callosities: Secondary | ICD-10-CM | POA: Insufficient documentation

## 2013-03-08 DIAGNOSIS — B351 Tinea unguium: Secondary | ICD-10-CM | POA: Diagnosis not present

## 2013-03-08 DIAGNOSIS — M79609 Pain in unspecified limb: Secondary | ICD-10-CM | POA: Diagnosis not present

## 2013-03-17 ENCOUNTER — Encounter: Payer: Self-pay | Admitting: Dietician

## 2013-03-18 ENCOUNTER — Telehealth: Payer: Self-pay | Admitting: *Deleted

## 2013-03-18 NOTE — Telephone Encounter (Signed)
Msg found that Novalog Flex Pen has been changed to [place] which is covered by [employer].  Sander Nephew, RN 03/18/2013 10:06 AM.

## 2013-03-25 ENCOUNTER — Other Ambulatory Visit: Payer: Self-pay | Admitting: *Deleted

## 2013-03-29 MED ORDER — INSULIN PEN NEEDLE 31G X 8 MM MISC: 1.0000 | Status: DC

## 2013-03-30 ENCOUNTER — Other Ambulatory Visit: Payer: Self-pay | Admitting: *Deleted

## 2013-03-31 MED ORDER — INSULIN PEN NEEDLE 31G X 8 MM MISC: 1.0000 | Status: DC

## 2013-04-01 ENCOUNTER — Other Ambulatory Visit (HOSPITAL_COMMUNITY): Payer: Self-pay | Admitting: Internal Medicine

## 2013-04-04 DIAGNOSIS — E1049 Type 1 diabetes mellitus with other diabetic neurological complication: Secondary | ICD-10-CM | POA: Diagnosis not present

## 2013-04-04 DIAGNOSIS — I739 Peripheral vascular disease, unspecified: Secondary | ICD-10-CM | POA: Diagnosis not present

## 2013-04-04 DIAGNOSIS — B351 Tinea unguium: Secondary | ICD-10-CM | POA: Diagnosis not present

## 2013-04-04 DIAGNOSIS — E1059 Type 1 diabetes mellitus with other circulatory complications: Secondary | ICD-10-CM | POA: Diagnosis not present

## 2013-04-04 DIAGNOSIS — L84 Corns and callosities: Secondary | ICD-10-CM | POA: Diagnosis not present

## 2013-04-04 DIAGNOSIS — M79609 Pain in unspecified limb: Secondary | ICD-10-CM | POA: Diagnosis not present

## 2013-04-05 DIAGNOSIS — B351 Tinea unguium: Secondary | ICD-10-CM | POA: Diagnosis not present

## 2013-04-05 DIAGNOSIS — M79609 Pain in unspecified limb: Secondary | ICD-10-CM | POA: Diagnosis not present

## 2013-04-14 ENCOUNTER — Encounter: Payer: Self-pay | Admitting: Internal Medicine

## 2013-04-14 ENCOUNTER — Ambulatory Visit (INDEPENDENT_AMBULATORY_CARE_PROVIDER_SITE_OTHER): Payer: Medicare Other | Admitting: Internal Medicine

## 2013-04-14 ENCOUNTER — Other Ambulatory Visit (HOSPITAL_COMMUNITY): Payer: Self-pay | Admitting: Internal Medicine

## 2013-04-14 VITALS — BP 90/55 | HR 70 | Temp 97.8°F | Ht 64.0 in | Wt 183.2 lb

## 2013-04-14 DIAGNOSIS — E1165 Type 2 diabetes mellitus with hyperglycemia: Secondary | ICD-10-CM

## 2013-04-14 DIAGNOSIS — IMO0002 Reserved for concepts with insufficient information to code with codable children: Secondary | ICD-10-CM

## 2013-04-14 DIAGNOSIS — G453 Amaurosis fugax: Secondary | ICD-10-CM | POA: Insufficient documentation

## 2013-04-14 DIAGNOSIS — H53129 Transient visual loss, unspecified eye: Secondary | ICD-10-CM | POA: Diagnosis not present

## 2013-04-14 DIAGNOSIS — H53139 Sudden visual loss, unspecified eye: Secondary | ICD-10-CM

## 2013-04-14 DIAGNOSIS — H3400 Transient retinal artery occlusion, unspecified eye: Secondary | ICD-10-CM

## 2013-04-14 DIAGNOSIS — IMO0001 Reserved for inherently not codable concepts without codable children: Secondary | ICD-10-CM | POA: Diagnosis not present

## 2013-04-14 LAB — POCT GLYCOSYLATED HEMOGLOBIN (HGB A1C): HEMOGLOBIN A1C: 7.3

## 2013-04-14 LAB — GLUCOSE, CAPILLARY: GLUCOSE-CAPILLARY: 110 mg/dL — AB (ref 70–99)

## 2013-04-14 MED ORDER — INSULIN ASPART 100 UNIT/ML ~~LOC~~ SOLN: 5.0000 [IU] | Freq: Three times a day (TID) | SUBCUTANEOUS | Status: DC

## 2013-04-14 MED ORDER — INSULIN PEN NEEDLE 31G X 8 MM MISC: 1.0000 | Status: DC

## 2013-04-14 MED ORDER — INSULIN GLARGINE 100 UNIT/ML ~~LOC~~ SOLN: 38.0000 [IU] | Freq: Every day | SUBCUTANEOUS | Status: DC

## 2013-04-14 NOTE — Assessment & Plan Note (Addendum)
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  She does not have any visual change or complaints now.  Physical examination is unremarkable.  No carotid bruit.  Difficulty to assess discs. All other neuro exam are unremarkable.   The etiology of monocular Amaurosis Fugax include the following.  1. ischemic causes   - Carotid artery disease   - Giant cell arteritis   - Other ischemic etiologies. 2. 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  Plan   - Will check her ESR today since she is over age of 72 to rule out giant cell arteritis. - Urgent ophthalmology for evaluation--discussed with her ophthalmologist who has an open appt at 0800 am on Friday 04/15/13. However, patient states that she is not available on Friday morning and insists to see him next week.  - Carotid imaging study - once GCA and carotid disease have been excluded, we should evaluate a cardiogenic source of embolism especially given her extensive cardiac history.  Last echo was done in March 2014.  We'll consider repeat echocardiogram and/or Holter monitoring--will discuss with her cardiologist. - consider brain MR/MRA- - hypercoagulable testing if all tests are otherwise negative - the treatment plan is--likely to change her anticoagulant  therapy to plavix vs xarelto/coumadin depending on the source between ischemic vs cardiogenic source of embolism.

## 2013-04-14 NOTE — Patient Instructions (Addendum)
1. Will send urgent referral for your eye exam. 2. Follow up in 1 week after you see Ophthalmologist.  3. Will increase your lantus to 38 units QHS, and please take your Humalog 5 units three times daily and add one unit to lunch if you do not exercise.

## 2013-04-14 NOTE — Progress Notes (Signed)
Patient ID: [name], female   DOB: [birth year], 58 y.o.   MRN: [id number]    Patient: [name]   MRN: [id number]  DOB: 03/30/1955  PCP: Charlann Lange, MD   Subjective:    CC: No chief complaint on file.   HPI: [name] is a 58 y.o. 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 She is here for DM follow up.   # DM type 2  States that she is doing fine. Denies thirst, polydipsia and polyuria. Denies blurry vision. Denies hypoglycemic events. She admits dietary indiscretion.   # Transient loss of right sided vision     Patient reports that she had acute onset right sided vision loss in mid Jan 2015. Her vision loss lasted 1-2 minutes and resolved spontaneously. Associated symptoms include mild headache, dizziness and lightheadedness. Denies vertigo, diplopia, vision loss afterwards, seizure activities, postictal confusion, loss of consciousness, weakness, numbness or tingling. Her CBG and BP were reported at the baseline at the time. CBG was 120 and BP was 90/56. She has never had similar symptoms in the past and states that this was only one episode. And she has been fine ever since.  She did not go to the ED, instead she called her cardiologist office and was instructed to monitor her BP.   Review of Systems: Per HPI.   Current Outpatient Medications: Current Outpatient Prescriptions  Medication Sig Dispense Refill  . allopurinol (ZYLOPRIM) 100 MG tablet Take 200 mg po in am and 100 mg po in pm  90 tablet  3  . aspirin 81 MG chewable tablet Chew 81 mg by mouth daily.      [name] atorvastatin (LIPITOR) 80 MG tablet Take 1 tablet (80 mg total) by mouth daily.  30 tablet  11  . carvedilol (COREG) 6.25 MG tablet Take 1 tablet (6.25 mg total) by mouth 2 (two) times daily with a meal.  60 tablet  3  . digoxin (LANOXIN) 0.125 MG tablet TAKE ONE TABLET BY MOUTH  ONCE DAILY  30 tablet  6  . furosemide (LASIX) 40 MG tablet TAKE ONE TABLET BY MOUTH TWICE DAILY  60 tablet  6  . glucose blood (ACCU-CHEK INSTANT PLUS TEST) test strip Use to test blood sugars 4 times a day. Insulin dependent. Dx code; 250.02.  120 each  12  . insulin aspart (NOVOLOG FLEXPEN) 100 UNIT/ML injection Inject 5 Units into the skin 3 (three) times daily before meals. diag code 250.02. Insulin dependent  10 mL  12  . insulin glargine (LANTUS SOLOSTAR) 100 UNIT/ML injection Inject 0.36 mLs (36 Units total) into the skin at bedtime.  12 mL  12  . insulin lispro (HUMALOG PEN) 100 UNIT/ML SOPN Inject 5 Units into the skin 3 (three) times daily with meals.  15 mL  11  . Insulin Pen Needle (B-D ULTRAFINE III SHORT PEN) 31G X 8 MM MISC 1 Device by Does not apply route as directed.  100 each  11  . lisinopril (PRINIVIL,ZESTRIL) 5 MG tablet Take 1 tablet (5 mg total) by mouth 2 (two) times daily.  60 tablet  3  . nitroGLYCERIN (NITROSTAT) 0.4 MG SL tablet Place 1 tablet (0.4 mg total) under the tongue every 5 (five) minutes as needed. For chest pain.  25 tablet  11  . spironolactone (ALDACTONE) 25 MG tablet Take 1 tablet (25 mg total)  by mouth daily.  30 tablet  3  . [DISCONTINUED] sitaGLIPtin (JANUVIA) 25 MG tablet Take 1 tablet (25 mg total) by mouth daily.  30 tablet  1   No current facility-administered medications for this visit.    Allergies: No Known Allergies  Past Medical History  Diagnosis Date  . Coronary artery disease   . Diabetes mellitus   . Hypertension   . CHF (congestive heart failure)     Systolic.  EF 68-11%  . Polysubstance abuse     history of  (cocaine, tobacco and ETOH)    Objective:    Physical Exam: Filed Vitals:   04/14/13 1517  BP: 90/55  Pulse: 70  Temp: 97.8 F (36.6 C)  TempSrc: Oral  Height: 5\' 4"  (1.626 m)  Weight: 183 lb 3.2 oz (83.099 kg)  SpO2: 97%     General: Vital signs reviewed and noted. Well-developed, well-nourished, in no  acute distress; alert, appropriate and cooperative throughout examination.  Head: Normocephalic, atraumatic.  Lungs:  Normal respiratory effort. Clear to auscultation BL without crackles or wheezes.  Heart: RRR. S1 and S2 normal without gallop, rubs,  murmur.  Abdomen:  BS normoactive. Soft, Nondistended, non-tender.  No masses or organomegaly.  Extremities: No pretibial edema.   Mental Status: Alert, oriented, thought content appropriate.  Speech fluent without evidence of aphasia.  Able to follow 3 step commands without difficulty. Cranial Nerves: II: difficulties to observe Discs; Visual fields grossly normal, pupils equal, round, reactive to light and accommodation III,IV, VI: ptosis not present, extra-ocular motions intact bilaterally V,VII: smile symmetric, facial light touch sensation normal bilaterally VIII: hearing normal bilaterally IX,X: gag reflex present XI: bilateral shoulder shrug XII: midline tongue extension without atrophy or fasciculations  Motor: Right : Upper extremity   5/5    Left:     Upper extremity   5/5  Lower extremity   5/5     Lower extremity   5/5 Tone and bulk:normal tone throughout; no atrophy noted Sensory: Pinprick and light touch intact throughout, bilaterally Deep Tendon Reflexes:  Right: Upper Extremity   Left: Upper extremity   biceps (C-5 to C-6) 2/4   biceps (C-5 to C-6) 2/4 tricep (C7) 2/4    triceps (C7) 2/4 Brachioradialis (C6) 2/4  Brachioradialis (C6) 2/4  Lower Extremity Lower Extremity  quadriceps (L-2 to L-4) 2/4   quadriceps (L-2 to L-4) 2/4    Cerebellar: normal finger-to-nose,  normal heel-to-shin test Gait: stable CV: pulses palpable throughout    Assessment/ Plan:

## 2013-04-14 NOTE — Assessment & Plan Note (Signed)
Lab Results  Component Value Date   HGBA1C 7.3 04/14/2013   HGBA1C 7.4 12/30/2012   HGBA1C 7.3 08/19/2012     Assessment: Diabetes control: fair control Progress toward A1C goal:  improved  patient states that she is better at the control of her diet and calories intake.  Glucometer is downloaded and reviewed.  Her am fasting CBGs are around 140's. Lunch and bedtime CBGS are fairly well controlled at 90's-110's. The average CBGs before dinner are higher at 150-190's. She admits that she used to exercise after the lunch and stopped for months due to her busy work schedule.   Plan: 1. Medications:      - will increase Lantus from 36 to 38 units QHS with the targeting am FBG 100-120.     - Will continue Humalog 5 units SQ with breakfast and dinner, 5 unit at lunch if exercise but 6 units if she does not exercise--given her sensitivity factor ~ 40.   2. Home glucose monitoring: AC and HS  3. Instruction/counseling given: reminded to get eye exam, reminded to bring blood glucose meter & log to each visit, reminded to bring medications to each visit, discussed foot care, discussed the need for weight loss, discussed diet and discussed sick day management  4. Educational resources provided:    5. Self management tools provided:

## 2013-04-15 LAB — SEDIMENTATION RATE: SED RATE: 40 mm/h — AB (ref 0–22)

## 2013-04-18 DIAGNOSIS — H3400 Transient retinal artery occlusion, unspecified eye: Secondary | ICD-10-CM | POA: Diagnosis not present

## 2013-04-18 DIAGNOSIS — E1139 Type 2 diabetes mellitus with other diabetic ophthalmic complication: Secondary | ICD-10-CM | POA: Diagnosis not present

## 2013-04-18 NOTE — Progress Notes (Signed)
Case discussed with Dr. Nicoletta Dress at the time of the visit.  We reviewed the resident's history and exam and pertinent patient test results.  I agree with the assessment, diagnosis and plan of care documented in the resident's note.

## 2013-04-22 ENCOUNTER — Encounter: Payer: Self-pay | Admitting: Internal Medicine

## 2013-04-22 ENCOUNTER — Ambulatory Visit (INDEPENDENT_AMBULATORY_CARE_PROVIDER_SITE_OTHER): Payer: Medicare Other | Admitting: Internal Medicine

## 2013-04-22 VITALS — BP 106/60 | HR 68 | Temp 96.7°F | Ht 64.0 in | Wt 183.3 lb

## 2013-04-22 DIAGNOSIS — H53129 Transient visual loss, unspecified eye: Secondary | ICD-10-CM | POA: Diagnosis not present

## 2013-04-22 DIAGNOSIS — I2510 Atherosclerotic heart disease of native coronary artery without angina pectoris: Secondary | ICD-10-CM | POA: Diagnosis not present

## 2013-04-22 DIAGNOSIS — G453 Amaurosis fugax: Secondary | ICD-10-CM

## 2013-04-22 DIAGNOSIS — IMO0002 Reserved for concepts with insufficient information to code with codable children: Secondary | ICD-10-CM

## 2013-04-22 DIAGNOSIS — IMO0001 Reserved for inherently not codable concepts without codable children: Secondary | ICD-10-CM

## 2013-04-22 DIAGNOSIS — H3400 Transient retinal artery occlusion, unspecified eye: Secondary | ICD-10-CM | POA: Diagnosis not present

## 2013-04-22 DIAGNOSIS — E1165 Type 2 diabetes mellitus with hyperglycemia: Secondary | ICD-10-CM

## 2013-04-22 MED ORDER — GLUCOSE BLOOD VI STRP: ORAL_STRIP | Status: DC

## 2013-04-22 NOTE — Progress Notes (Signed)
Patient ID: [name], female   DOB: 15-Mar-1955, 58 y.o.   MRN: [id number]    Patient: [name]   MRN: [id number]  DOB: 10-Feb-1956  PCP: Charlann Lange, MD   Subjective:    CC: Follow-up and Medication Refill   HPI: [name] is a 58 y.o. 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 She is here for follow up.   # F/U on Amaurosis Fugax    Patient reported one episode of right sided vision loss in mid Jan 2015. Her vision loss lasted 1-2 minutes and resolved spontaneously. No associated symptoms. No focal neuro deficit on exam. She was referred to her Pathobiologist and no ocular abnormality noted. She is scheduled to have carotid doppler and Echo to evaluated the possible embolic causes.   She states that she is doing well. NO c/o.    Review of Systems: Per HPI.   Current Outpatient Medications: Current Outpatient Prescriptions  Medication Sig Dispense Refill  . allopurinol (ZYLOPRIM) 100 MG tablet Take 200 mg po in am and 100 mg po in pm  90 tablet  3  . aspirin 81 MG chewable tablet Chew 81 mg by mouth daily.      [name] atorvastatin (LIPITOR) 80 MG tablet Take 1 tablet (80 mg total) by mouth daily.  30 tablet  11  . carvedilol (COREG) 6.25 MG tablet TAKE ONE TABLET BY MOUTH TWICE DAILY WITH MEALS  60 tablet  6  . digoxin (LANOXIN) 0.125 MG tablet TAKE ONE TABLET BY MOUTH ONCE DAILY  30 tablet  6  . furosemide (LASIX) 40 MG tablet TAKE ONE TABLET BY MOUTH TWICE DAILY  60 tablet  6  . glucose blood (ACCU-CHEK INSTANT PLUS TEST) test strip Use to test blood sugars 4 times a day. Insulin dependent. Dx code; 250.02.  120 each  12  . insulin aspart (NOVOLOG FLEXPEN) 100 UNIT/ML injection Inject 5 Units into the skin 3 (three) times daily before meals. diag code 250.02. Insulin dependent  10 mL  12  . insulin glargine (LANTUS) 100 UNIT/ML injection Inject  0.38 mLs (38 Units total) into the skin at bedtime.  12 mL  12  . insulin lispro (HUMALOG PEN) 100 UNIT/ML SOPN Inject 5 Units into the skin 3 (three) times daily with meals.  15 mL  11  . Insulin Pen Needle (B-D ULTRAFINE III SHORT PEN) 31G X 8 MM MISC 1 Device by Does not apply route as directed.  130 each  11  . lisinopril (PRINIVIL,ZESTRIL) 5 MG tablet Take 1 tablet (5 mg total) by mouth 2 (two) times daily.  60 tablet  3  . nitroGLYCERIN (NITROSTAT) 0.4 MG SL tablet Place 1 tablet (0.4 mg total) under the tongue every 5 (five) minutes as needed. For chest pain.  25 tablet  11  . spironolactone (ALDACTONE) 25 MG tablet TAKE ONE TABLET BY MOUTH ONCE DAILY  30 tablet  6  . [DISCONTINUED] sitaGLIPtin (JANUVIA) 25 MG tablet Take 1 tablet (25 mg total) by mouth daily.  30 tablet  1   No current facility-administered medications for this visit.    Allergies: No Known Allergies  Past Medical History  Diagnosis Date  . Coronary artery disease   . Diabetes mellitus   . Hypertension   . CHF (congestive heart failure)     Systolic.  EF 08-65%  . Polysubstance abuse  history of  (cocaine, tobacco and ETOH)    Objective:    Physical Exam: Filed Vitals:   04/22/13 1014  BP: 106/60  Pulse: 68  Temp: 96.7 F (35.9 C)  TempSrc: Oral  Height: 5\' 4"  (1.626 m)  Weight: 183 lb 4.8 oz (83.144 kg)  SpO2: 98%      General: Vital signs reviewed and noted. Well-developed, well-nourished, in no acute distress; alert, appropriate and cooperative throughout examination.  Head: Normocephalic, atraumatic.  Lungs:  Normal respiratory effort. Clear to auscultation BL without crackles or wheezes.  Heart: RRR. S1 and S2 normal without gallop, rubs, murmur.  Abdomen:  BS normoactive. Soft, Nondistended, non-tender.  No masses or organomegaly.  Extremities: No pretibial edema.   Assessment/ Plan:

## 2013-04-22 NOTE — Patient Instructions (Signed)
1. Will schedule your carotid doppler and echo asap 2. Will discuss with your cardiologist about the secondary prevention. 3. Follow up in 3 month.

## 2013-04-25 ENCOUNTER — Ambulatory Visit (HOSPITAL_COMMUNITY)
Admission: RE | Admit: 2013-04-25 | Discharge: 2013-04-25 | Disposition: A | Discharge status: Home / Self Care | Payer: Medicare Other | Source: Ambulatory Visit | Attending: Internal Medicine | Admitting: Internal Medicine

## 2013-04-25 ENCOUNTER — Encounter: Payer: Self-pay | Admitting: Internal Medicine

## 2013-04-25 VITALS — BP 100/59 | HR 55 | Resp 16 | Wt 181.5 lb

## 2013-04-25 DIAGNOSIS — I5022 Chronic systolic (congestive) heart failure: Secondary | ICD-10-CM

## 2013-04-25 DIAGNOSIS — H53139 Sudden visual loss, unspecified eye: Secondary | ICD-10-CM

## 2013-04-25 DIAGNOSIS — N183 Chronic kidney disease, stage 3 unspecified: Secondary | ICD-10-CM | POA: Insufficient documentation

## 2013-04-25 DIAGNOSIS — G453 Amaurosis fugax: Secondary | ICD-10-CM

## 2013-04-25 DIAGNOSIS — F1411 Cocaine abuse, in remission: Secondary | ICD-10-CM

## 2013-04-25 DIAGNOSIS — I059 Rheumatic mitral valve disease, unspecified: Secondary | ICD-10-CM

## 2013-04-25 DIAGNOSIS — I2510 Atherosclerotic heart disease of native coronary artery without angina pectoris: Secondary | ICD-10-CM | POA: Insufficient documentation

## 2013-04-25 DIAGNOSIS — F1410 Cocaine abuse, uncomplicated: Secondary | ICD-10-CM | POA: Insufficient documentation

## 2013-04-25 MED ORDER — DIGOXIN 125 MCG PO TABS: 0.0625 mg | ORAL_TABLET | ORAL | Status: DC

## 2013-04-25 NOTE — Progress Notes (Signed)
Patient ID: [name], female   DOB: [birth year], 58 y.o.   MRN: [id number]  HPI: 58 year old female with history of HTN, DM2, polysubstance abuse (ETOH, tobacco, cocaine) and PAD. 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 At time of cath LAD, LCX and RCA occluded. EF 45%. Had abrupt stent occlusion the next day and had to go back to the lab. 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   [facility] 03/17/11  RA = 8  RV = 47/1/9  PA = 52/12 (28)  PCW = 20 no significant v-waves  Fick cardiac output/index = 4.2/2.2  PVR =1.6 Woods  FA sat = 95%  PA sat = 61%, 63%  Ao Pressure: 89/58 (71)  LV Pressure: 86/12/24  Severe native CAD with all bypass grafts patent   ECHO 2011 EF 20-25% -> Medtronic ICD placed.  Echo 03/2011: Severe LV dysfunction EF 10-15%  11/17/11 EF 25-30% 05/19/2012  EF 15%  04/10/11: CPX (on Carvedilol)  Peak VO2: 13.6 ml/kg/min predicted peak VO2: 63.3% VE/VCO2 slope: 44.9 OUES: 1.11Peak RER: 1.12   CPX: 06/17/12 Peak VO2 14.8 (predicted peak VO2 68.5%), VE/VCO2 slope 43.4 OUES: 1.19, Peak RER 1.12 Vent threshold 11.3 (pred peak VO2 52.3%)  Follow up: Doing well. Denies SOB, orthopnea, PND or CP. Weight at home 181 lbs. Takes medications as prescribed. Has not been exercising because she has been busy, but is going to try and start back riding her stationary bike. Remains alcohol and drug free (over 2 years). Following low salt diet and drinking less than 2L a day. + dizziness that comes and goes.   08/12/12 - on lipitor 40 mg daily Cholesterol 180 Triglyceride 195 HDL 29 LDL 112 K 4.5, creatinine 1.4 11/16/12 K 4.0 Labs 1.5 Pro BNP 181 02/14/13: K+ 4.6, Cr 1.33, Dig level 1.8, TSH 2.97  ROS: All systems negative except as listed in HPI, PMH and Problem List.  Past Medical History  Diagnosis Date  . Coronary artery disease   . Diabetes mellitus   . Hypertension   . CHF (congestive  heart failure)     Systolic.  EF 67-59%  . Polysubstance abuse     history of  (cocaine, tobacco and ETOH)    Current Outpatient Prescriptions  Medication Sig Dispense Refill  . allopurinol (ZYLOPRIM) 100 MG tablet Take 200 mg po in am and 100 mg po in pm  90 tablet  3  . aspirin 81 MG chewable tablet Chew 81 mg by mouth daily.      [name] atorvastatin (LIPITOR) 80 MG tablet Take 1 tablet (80 mg total) by mouth daily.  30 tablet  11  . carvedilol (COREG) 6.25 MG tablet TAKE ONE TABLET BY MOUTH TWICE DAILY WITH MEALS  60 tablet  6  . digoxin (LANOXIN) 0.125 MG tablet TAKE ONE TABLET BY MOUTH ONCE DAILY  30 tablet  6  . furosemide (LASIX) 40 MG tablet TAKE ONE TABLET BY MOUTH TWICE DAILY  60 tablet  6  . glucose blood (ACCU-CHEK INSTANT PLUS TEST) test strip Use to test blood sugars 4 times a day. Insulin dependent. Dx code; 250.02.  120 each  12  . insulin aspart (NOVOLOG FLEXPEN) 100 UNIT/ML injection Inject 5 Units into the skin 3 (three) times daily before meals. diag code 250.02. Insulin dependent  10 mL  12  . insulin glargine (LANTUS) 100 UNIT/ML injection Inject  0.38 mLs (38 Units total) into the skin at bedtime.  12 mL  12  . insulin lispro (HUMALOG PEN) 100 UNIT/ML SOPN Inject 5 Units into the skin 3 (three) times daily with meals.  15 mL  11  . Insulin Pen Needle (B-D ULTRAFINE III SHORT PEN) 31G X 8 MM MISC 1 Device by Does not apply route as directed.  130 each  11  . lisinopril (PRINIVIL,ZESTRIL) 5 MG tablet Take 1 tablet (5 mg total) by mouth 2 (two) times daily.  60 tablet  3  . nitroGLYCERIN (NITROSTAT) 0.4 MG SL tablet Place 1 tablet (0.4 mg total) under the tongue every 5 (five) minutes as needed. For chest pain.  25 tablet  11  . spironolactone (ALDACTONE) 25 MG tablet TAKE ONE TABLET BY MOUTH ONCE DAILY  30 tablet  6  . [DISCONTINUED] sitaGLIPtin (JANUVIA) 25 MG tablet Take 1 tablet (25 mg total) by mouth daily.  30 tablet  1   No current facility-administered medications for  this encounter.    Filed Vitals:   04/25/13 0916  BP: 100/59  Pulse: 55  Resp: 16  Weight: 181 lb 8 oz (82.328 kg)  SpO2: 96%   PHYSICAL EXAM: General:  Well appearing. No resp difficulty HEENT: normal Neck: supple. JVP flat Carotids 2+ bilaterally; no bruits. No lymphadenopathy or thryomegaly appreciated. Cor: PMI normal. Regular rate & rhythm. 2/6 TR Lungs: clear Abdomen: soft, nontender, mildly distended. No hepatosplenomegaly. No bruits or masses. Good bowel sounds. Extremities: no cyanosis, clubbing, rash, edema Neuro: alert & orientedx3, cranial nerves grossly intact. Moves all 4 extremities w/o difficulty. Affect pleasant.   ASSESSMENT & PLAN:  1. Chronic Systolic Heart Failure: ICM. EF 15% (05/2012). She has Medtronic ICD, not Optivol - NYHA II symptoms and volume status stable. Will continue lasix 40 mg daily.  - SBP too soft to titrate medications will continue coreg 6.25 mg BID, Lisinopril 5 mg BID and Spiro 25 mg daily. - Reviewed labs and last digoxing level 1.8. She reports that in January she had loss of vision of R eye and L eye was blurry. Has not had any more issues. Will cut digoxin back to 0.0625 QOD.    - Discussed in depth the concern she may need heart tx or LVAD in the future. Will repeat CPX test. If remains marginal would like to get her a tx evaluation since she is Type O and would have a long wait. Discussed with patient and she understands. - Interrogated ICD and no Afib. Patient activity ~6 hrs today. - Reinforced the need and importance of daily weights, a low sodium diet, and fluid restriction (less than 2 L a day). Instructed to call the HF clinic if weight increases more than 3 lbs overnight or 5 lbs in a week.  2. CAD-No ischemic symptoms.  Continue ASA, statin and BB.  3. CKD stage III.  Reviewed BMET from 12/14 Creatinine stable. 4. Hx drug abuse: Remains abstinent from drug use and ETOH for >2 years. Congratulated the patient.    Follow up in  3-4 months on MD side to review CPX.   Rande Brunt 04/25/2013

## 2013-04-25 NOTE — Patient Instructions (Addendum)
Doing great.  Call if you start feeling bad or not able to do what you normally can.  Change your digoxin to 0.0625 (1/2 tablet) every other day. You have 0.125 mg tablets.   F/U 3-4 months.  CPX test 05/02/2013 at 10:30 am. *Check in same desk as [facility] appointment in [facility], they will direct you from there.  Your physician has recommended that you have a cardiopulmonary stress test (CPX). CPX testing is a non-invasive measurement of heart and lung function. It replaces a traditional treadmill stress test. This type of test provides a tremendous amount of information that relates not only to your present condition but also for future outcomes. This test combines measurements of you ventilation, respiratory gas exchange in the lungs, electrocardiogram (EKG), blood pressure and physical response before, during, and following an exercise protocol.  Do the following things EVERYDAY: 1) Weigh yourself in the morning before breakfast. Write it down and keep it in a log. 2) Take your medicines as prescribed 3) Eat low salt foods-Limit salt (sodium) to 2000 mg per day.  4) Stay as active as you can everyday 5) Limit all fluids for the day to less than 2 liters

## 2013-04-25 NOTE — Progress Notes (Signed)
  Echocardiogram 2D Echocardiogram has been performed.  Basilia Jumbo 04/25/2013, 12:23 PM

## 2013-04-25 NOTE — Progress Notes (Signed)
Bilateral carotid artery duplex:  1-39% ICA stenosis.  Vertebral artery flow is antegrade.     

## 2013-04-26 NOTE — Progress Notes (Signed)
Case discussed with Dr. Li at the time of the visit.  We reviewed the resident's history and exam and pertinent patient test results.  I agree with the assessment, diagnosis, and plan of care documented in the resident's note. 

## 2013-04-26 NOTE — Assessment & Plan Note (Signed)
Lab Results  Component Value Date   HGBA1C 7.3 04/14/2013   HGBA1C 7.4 12/30/2012   HGBA1C 7.3 08/19/2012     Assessment: CBGs are much better since her Basal lantus was increased to 38 units.  Glucometer is downloaded and reviewed. AM FBG is at 120's. No hypoglycemic events.   Plan: Medications:  continue current medications Home glucose monitoring: Frequency:   Timing:   Instruction/counseling given: reminded to get eye exam, reminded to bring blood glucose meter & log to each visit, reminded to bring medications to each visit, discussed foot care, discussed the need for weight loss and discussed diet Educational resources provided: brochure;handout Self management tools provided: copy of home glucose meter download Other plans:

## 2013-04-26 NOTE — Assessment & Plan Note (Addendum)
Assessment: Patient has one episode of Amaurosis Fugax lasted for 1-2 minutes. No neuro deficit. No ocular etiologies identified by her Opthalmologist. ESR mildly elevated. GCA is less likely given her free of Headache and no vision changes.  We are in process of evaluating possible embolic source--carotid doppler and echo pending  Plan: - Carotid Doppler and echo  - consider MRI/MRA brain if no source identified. - consider anticoagulant therapy--need to discuss with card given her extensive cardiac hx, A fib and possible plan of LVAD.   Addendum carotid doppler Bilateral: 1-39% ICA stenosis. Vertebral artery flow is antegrade. Right: ICA/CCA ratio is 1.17. Left: ICA/CCA ratio is 0.84.  Echo - Left ventricle: The cavity size was mildly dilated. Wall thickness was normal. The estimated ejection fraction was 15%. Diffuse hypokinesis. Doppler parameters are consistent with restrictive physiology, indicative of decreased left ventricular diastolic compliance and/or increased left atrial pressure. Doppler parameters are consistent with high ventricular filling pressure. - Mitral valve: Prior procedures included surgical repair. Moderate to severe regurgitation. - Left atrium: The atrium was moderately dilated. - Right ventricle: Systolic function was mildly reduced. - Right atrium: The atrium was mildly dilated. - Pulmonary arteries: Systolic pressure was mildly increased. PA peak pressure: 83m Hg (S). Impressions:  - Compared to 05/19/12, LV function remains severely reduced; MR appears to be at least moderate and may be severe (worse compared to previous  Plan: - Carotid Doppler is nonrevealing - Echo showed significant low EF of 15% with moderate to severe MR - will contact cardiology given her extensive cardiac hx, A fib and possible plan of LVAD.

## 2013-04-28 ENCOUNTER — Ambulatory Visit (HOSPITAL_COMMUNITY): Payer: Medicare Other

## 2013-04-29 ENCOUNTER — Other Ambulatory Visit (HOSPITAL_COMMUNITY): Payer: Self-pay

## 2013-05-02 ENCOUNTER — Ambulatory Visit (HOSPITAL_COMMUNITY): Payer: Medicare Other | Attending: Internal Medicine

## 2013-05-02 DIAGNOSIS — I5022 Chronic systolic (congestive) heart failure: Secondary | ICD-10-CM | POA: Diagnosis not present

## 2013-05-02 DIAGNOSIS — R0602 Shortness of breath: Secondary | ICD-10-CM | POA: Diagnosis not present

## 2013-05-05 ENCOUNTER — Other Ambulatory Visit (HOSPITAL_COMMUNITY): Payer: Self-pay | Admitting: Internal Medicine

## 2013-05-06 ENCOUNTER — Other Ambulatory Visit (HOSPITAL_COMMUNITY): Payer: Self-pay | Admitting: Cardiology

## 2013-05-17 ENCOUNTER — Telehealth (HOSPITAL_COMMUNITY): Payer: Self-pay | Admitting: Cardiology

## 2013-05-17 NOTE — Telephone Encounter (Signed)
Pt called with c/o increased SOB and dizziness even with decreased dose of dig Please advise

## 2013-05-17 NOTE — Telephone Encounter (Signed)
Spoke w/pt she states she is still getting lightheaded with standing, she also reports increased SOB and fatigue, she states wt is 178 today which is stable for her as it usually runs 178-182, she is unable to check her BP at home, she denies edema but does state abd is tight, will discuss with MD and call pt back

## 2013-05-19 ENCOUNTER — Encounter (HOSPITAL_COMMUNITY): Payer: Self-pay | Admitting: *Deleted

## 2013-05-19 NOTE — Telephone Encounter (Signed)
Robin Bame, NP spoke w/pt on 3/17 and discussed w/Dr Bensimhon, they have decided to do [place] on pt, pt aware and cath sch for Mon 3/23, instructions reviewed with her via phone

## 2013-05-20 ENCOUNTER — Encounter (HOSPITAL_COMMUNITY): Payer: Self-pay

## 2013-05-20 ENCOUNTER — Other Ambulatory Visit: Payer: Self-pay | Admitting: *Deleted

## 2013-05-23 ENCOUNTER — Ambulatory Visit (HOSPITAL_COMMUNITY)
Admission: RE | Admit: 2013-05-23 | Discharge: 2013-05-23 | Disposition: A | Discharge status: Home / Self Care | Payer: Medicare Other | Source: Ambulatory Visit | Attending: Internal Medicine | Admitting: Internal Medicine

## 2013-05-23 ENCOUNTER — Encounter (HOSPITAL_COMMUNITY)
Admission: RE | Disposition: A | Discharge status: Home / Self Care | Payer: Self-pay | Source: Ambulatory Visit | Attending: Internal Medicine

## 2013-05-23 DIAGNOSIS — Z794 Long term (current) use of insulin: Secondary | ICD-10-CM | POA: Insufficient documentation

## 2013-05-23 DIAGNOSIS — Z951 Presence of aortocoronary bypass graft: Secondary | ICD-10-CM | POA: Insufficient documentation

## 2013-05-23 DIAGNOSIS — Z79899 Other long term (current) drug therapy: Secondary | ICD-10-CM | POA: Insufficient documentation

## 2013-05-23 DIAGNOSIS — I509 Heart failure, unspecified: Secondary | ICD-10-CM | POA: Diagnosis not present

## 2013-05-23 DIAGNOSIS — I2510 Atherosclerotic heart disease of native coronary artery without angina pectoris: Secondary | ICD-10-CM | POA: Insufficient documentation

## 2013-05-23 DIAGNOSIS — N183 Chronic kidney disease, stage 3 unspecified: Secondary | ICD-10-CM | POA: Insufficient documentation

## 2013-05-23 DIAGNOSIS — F17200 Nicotine dependence, unspecified, uncomplicated: Secondary | ICD-10-CM | POA: Insufficient documentation

## 2013-05-23 DIAGNOSIS — E119 Type 2 diabetes mellitus without complications: Secondary | ICD-10-CM | POA: Insufficient documentation

## 2013-05-23 DIAGNOSIS — F1010 Alcohol abuse, uncomplicated: Secondary | ICD-10-CM | POA: Insufficient documentation

## 2013-05-23 DIAGNOSIS — I5022 Chronic systolic (congestive) heart failure: Secondary | ICD-10-CM | POA: Insufficient documentation

## 2013-05-23 DIAGNOSIS — Z95810 Presence of automatic (implantable) cardiac defibrillator: Secondary | ICD-10-CM | POA: Insufficient documentation

## 2013-05-23 DIAGNOSIS — Z7982 Long term (current) use of aspirin: Secondary | ICD-10-CM | POA: Insufficient documentation

## 2013-05-23 DIAGNOSIS — I252 Old myocardial infarction: Secondary | ICD-10-CM | POA: Insufficient documentation

## 2013-05-23 DIAGNOSIS — F1410 Cocaine abuse, uncomplicated: Secondary | ICD-10-CM | POA: Insufficient documentation

## 2013-05-23 DIAGNOSIS — I129 Hypertensive chronic kidney disease with stage 1 through stage 4 chronic kidney disease, or unspecified chronic kidney disease: Secondary | ICD-10-CM | POA: Insufficient documentation

## 2013-05-23 HISTORY — PX: RIGHT HEART CATHETERIZATION: SHX5447

## 2013-05-23 LAB — POCT I-STAT 3, VENOUS BLOOD GAS (G3P V)
ACID-BASE DEFICIT: 1 mmol/L (ref 0.0–2.0)
Acid-Base Excess: 1 mmol/L (ref 0.0–2.0)
Bicarbonate: 24.2 mEq/L — ABNORMAL HIGH (ref 20.0–24.0)
Bicarbonate: 25.9 mEq/L — ABNORMAL HIGH (ref 20.0–24.0)
O2 SAT: 54 %
O2 Saturation: 54 %
PCO2 VEN: 44 mmHg — AB (ref 45.0–50.0)
PH VEN: 7.378 — AB (ref 7.250–7.300)
TCO2: 25 mmol/L (ref 0–100)
TCO2: 27 mmol/L (ref 0–100)
pCO2, Ven: 42.8 mmHg — ABNORMAL LOW (ref 45.0–50.0)
pH, Ven: 7.36 — ABNORMAL HIGH (ref 7.250–7.300)
pO2, Ven: 29 mmHg — CL (ref 30.0–45.0)
pO2, Ven: 30 mmHg (ref 30.0–45.0)

## 2013-05-23 LAB — PROTIME-INR
INR: 1.06 (ref 0.00–1.49)
PROTHROMBIN TIME: 13.6 s (ref 11.6–15.2)

## 2013-05-23 LAB — BASIC METABOLIC PANEL
BUN: 36 mg/dL — ABNORMAL HIGH (ref 6–23)
CALCIUM: 9.5 mg/dL (ref 8.4–10.5)
CO2: 24 mEq/L (ref 19–32)
Chloride: 103 mEq/L (ref 96–112)
Creatinine, Ser: 1.07 mg/dL (ref 0.50–1.10)
GFR calc Af Amer: 66 mL/min — ABNORMAL LOW (ref >90)
GFR, EST NON AFRICAN AMERICAN: 57 mL/min — AB (ref >90)
Glucose, Bld: 142 mg/dL — ABNORMAL HIGH (ref 70–99)
Potassium: 4.2 mEq/L (ref 3.7–5.3)
SODIUM: 141 meq/L (ref 137–147)

## 2013-05-23 LAB — CBC
HCT: 32.4 % — ABNORMAL LOW (ref 36.0–46.0)
Hemoglobin: 10.5 g/dL — ABNORMAL LOW (ref 12.0–15.0)
MCH: 28.8 pg (ref 26.0–34.0)
MCHC: 32.4 g/dL (ref 30.0–36.0)
MCV: 89 fL (ref 78.0–100.0)
PLATELETS: 146 10*3/uL — AB (ref 150–400)
RBC: 3.64 MIL/uL — AB (ref 3.87–5.11)
RDW: 15.8 % — ABNORMAL HIGH (ref 11.5–15.5)
WBC: 6.1 10*3/uL (ref 4.0–10.5)

## 2013-05-23 LAB — GLUCOSE, CAPILLARY
GLUCOSE-CAPILLARY: 87 mg/dL (ref 70–99)
Glucose-Capillary: 133 mg/dL — ABNORMAL HIGH (ref 70–99)

## 2013-05-23 SURGERY — RIGHT HEART CATH
Anesthesia: LOCAL

## 2013-05-23 MED ORDER — HEPARIN (PORCINE) IN NACL 2-0.9 UNIT/ML-% IJ SOLN
INTRAMUSCULAR | Status: AC
  Filled 2013-05-23: qty 500

## 2013-05-23 MED ORDER — SODIUM CHLORIDE 0.9 % IV SOLN: 250.0000 mL | INTRAVENOUS | Status: DC | PRN

## 2013-05-23 MED ORDER — ASPIRIN 81 MG PO CHEW
CHEWABLE_TABLET | ORAL | Status: AC
  Administered 2013-05-23: 81 mg
  Filled 2013-05-23: qty 1

## 2013-05-23 MED ORDER — LIDOCAINE HCL (PF) 1 % IJ SOLN
INTRAMUSCULAR | Status: AC
  Filled 2013-05-23: qty 30

## 2013-05-23 MED ORDER — SODIUM CHLORIDE 0.9 % IV SOLN
INTRAVENOUS | Status: DC
  Administered 2013-05-23: 09:00:00 via INTRAVENOUS

## 2013-05-23 MED ORDER — SODIUM CHLORIDE 0.9 % IJ SOLN: 3.0000 mL | Freq: Two times a day (BID) | INTRAMUSCULAR | Status: DC

## 2013-05-23 MED ORDER — DIAZEPAM 5 MG PO TABS
5.0000 mg | ORAL_TABLET | ORAL | Status: AC
  Administered 2013-05-23: 5 mg via ORAL
  Filled 2013-05-23: qty 1

## 2013-05-23 MED ORDER — SODIUM CHLORIDE 0.9 % IJ SOLN: 3.0000 mL | INTRAMUSCULAR | Status: DC | PRN

## 2013-05-23 MED ORDER — MIDAZOLAM HCL 2 MG/2ML IJ SOLN
INTRAMUSCULAR | Status: AC
  Filled 2013-05-23: qty 2

## 2013-05-23 NOTE — Interval H&P Note (Signed)
History and Physical Interval Note:  05/23/2013 11:07 AM  [name]  has presented today for surgery, with the diagnosis of Heart Failure  The various methods of treatment have been discussed with the patient and family. After consideration of risks, benefits and other options for treatment, the patient has consented to  Procedure(s): RIGHT HEART CATH (N/A) as a surgical intervention .  The patient's history has been reviewed, patient examined, no change in status, stable for surgery.  I have reviewed the patient's chart and labs.  Questions were answered to the patient's satisfaction.     [name]

## 2013-05-23 NOTE — Discharge Instructions (Signed)
Angiography, Care After  Refer to this sheet in the next few weeks. These instructions provide you with information on caring for yourself after your procedure. Your health care provider may also give you more specific instructions. Your treatment has been planned according to current medical practices, but problems sometimes occur. Call your health care provider if you have any problems or questions after your procedure.  WHAT TO EXPECT AFTER THE PROCEDURE After your procedure, it is typical to have the following sensations:  Minor discomfort or tenderness and a small bump at the catheter insertion site. The bump should usually decrease in size and tenderness within 1 to 2 weeks.  Any bruising will usually fade within 2 to 4 weeks. HOME CARE INSTRUCTIONS   You may need to keep taking blood thinners if they were prescribed for you. Only take over-the-counter or prescription medicines for pain, fever, or discomfort as directed by your health care provider.  Do not apply powder or lotion to the site.  Do not sit in a bathtub, swimming pool, or whirlpool for 5 to 7 days.  You may shower 24 hours after the procedure. Remove the bandage (dressing) and gently wash the site with plain soap and water. Gently pat the site dry.  Inspect the site at least twice daily.  Limit your activity for the first 24 hours. Do not bend, squat, or lift anything over 10 lb (9 kg) or as directed by your health care provider.  Do not drive home if you are discharged the day of the procedure. Have someone else drive you. Follow instructions about when you can drive or return to work. SEEK MEDICAL CARE IF:  You get lightheaded when standing up.  You have drainage (other than a small amount of blood on the dressing).  You have chills.  You have a fever.  You have redness, warmth, swelling, or pain at the insertion site. SEEK IMMEDIATE MEDICAL CARE IF:   You develop chest pain or shortness of breath, feel  faint, or pass out.  You have bleeding, swelling larger than a walnut, or drainage from the catheter insertion site.  You have heavy bleeding from the site. If this happens, hold pressure on the site. MAKE SURE YOU:  Understand these instructions.  Will watch your condition.  Will get help right away if you are not doing well or get worse. Document Released: 09/05/2004 Document Revised: 10/20/2012 Document Reviewed: 07/12/2012 [facility] Patient Information 2014 [place].

## 2013-05-23 NOTE — H&P (View-Only) (Signed)
Patient ID: [name], female   DOB: 11-27-1955, 58 y.o.   MRN: [id number]  HPI: 58 year old female with history of HTN, DM2, polysubstance abuse (ETOH, tobacco, cocaine) and PAD. 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 At time of cath LAD, LCX and RCA occluded. EF 45%. Had abrupt stent occlusion the next day and had to go back to the lab. 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   [facility] 03/17/11  RA = 8  RV = 47/1/9  PA = 52/12 (28)  PCW = 20 no significant v-waves  Fick cardiac output/index = 4.2/2.2  PVR =1.6 Woods  FA sat = 95%  PA sat = 61%, 63%  Ao Pressure: 89/58 (71)  LV Pressure: 86/12/24  Severe native CAD with all bypass grafts patent   ECHO 2011 EF 20-25% -> Medtronic ICD placed.  Echo 03/2011: Severe LV dysfunction EF 10-15%  11/17/11 EF 25-30% 05/19/2012  EF 15%  04/10/11: CPX (on Carvedilol)  Peak VO2: 13.6 ml/kg/min predicted peak VO2: 63.3% VE/VCO2 slope: 44.9 OUES: 1.11Peak RER: 1.12   CPX: 06/17/12 Peak VO2 14.8 (predicted peak VO2 68.5%), VE/VCO2 slope 43.4 OUES: 1.19, Peak RER 1.12 Vent threshold 11.3 (pred peak VO2 52.3%)  Follow up: Doing well. Denies SOB, orthopnea, PND or CP. Weight at home 181 lbs. Takes medications as prescribed. Has not been exercising because she has been busy, but is going to try and start back riding her stationary bike. Remains alcohol and drug free (over 2 years). Following low salt diet and drinking less than 2L a day. + dizziness that comes and goes.   08/12/12 - on lipitor 40 mg daily Cholesterol 180 Triglyceride 195 HDL 29 LDL 112 K 4.5, creatinine 1.4 11/16/12 K 4.0 Labs 1.5 Pro BNP 181 02/14/13: K+ 4.6, Cr 1.33, Dig level 1.8, TSH 2.97  ROS: All systems negative except as listed in HPI, PMH and Problem List.  Past Medical History  Diagnosis Date  . Coronary artery disease   . Diabetes mellitus   . Hypertension   . CHF (congestive  heart failure)     Systolic.  EF 02-72%  . Polysubstance abuse     history of  (cocaine, tobacco and ETOH)    Current Outpatient Prescriptions  Medication Sig Dispense Refill  . allopurinol (ZYLOPRIM) 100 MG tablet Take 200 mg po in am and 100 mg po in pm  90 tablet  3  . aspirin 81 MG chewable tablet Chew 81 mg by mouth daily.      [name] atorvastatin (LIPITOR) 80 MG tablet Take 1 tablet (80 mg total) by mouth daily.  30 tablet  11  . carvedilol (COREG) 6.25 MG tablet TAKE ONE TABLET BY MOUTH TWICE DAILY WITH MEALS  60 tablet  6  . digoxin (LANOXIN) 0.125 MG tablet TAKE ONE TABLET BY MOUTH ONCE DAILY  30 tablet  6  . furosemide (LASIX) 40 MG tablet TAKE ONE TABLET BY MOUTH TWICE DAILY  60 tablet  6  . glucose blood (ACCU-CHEK INSTANT PLUS TEST) test strip Use to test blood sugars 4 times a day. Insulin dependent. Dx code; 250.02.  120 each  12  . insulin aspart (NOVOLOG FLEXPEN) 100 UNIT/ML injection Inject 5 Units into the skin 3 (three) times daily before meals. diag code 250.02. Insulin dependent  10 mL  12  . insulin glargine (LANTUS) 100 UNIT/ML injection Inject  0.38 mLs (38 Units total) into the skin at bedtime.  12 mL  12  . insulin lispro (HUMALOG PEN) 100 UNIT/ML SOPN Inject 5 Units into the skin 3 (three) times daily with meals.  15 mL  11  . Insulin Pen Needle (B-D ULTRAFINE III SHORT PEN) 31G X 8 MM MISC 1 Device by Does not apply route as directed.  130 each  11  . lisinopril (PRINIVIL,ZESTRIL) 5 MG tablet Take 1 tablet (5 mg total) by mouth 2 (two) times daily.  60 tablet  3  . nitroGLYCERIN (NITROSTAT) 0.4 MG SL tablet Place 1 tablet (0.4 mg total) under the tongue every 5 (five) minutes as needed. For chest pain.  25 tablet  11  . spironolactone (ALDACTONE) 25 MG tablet TAKE ONE TABLET BY MOUTH ONCE DAILY  30 tablet  6  . [DISCONTINUED] sitaGLIPtin (JANUVIA) 25 MG tablet Take 1 tablet (25 mg total) by mouth daily.  30 tablet  1   No current facility-administered medications for  this encounter.    Filed Vitals:   04/25/13 0916  BP: 100/59  Pulse: 55  Resp: 16  Weight: 181 lb 8 oz (82.328 kg)  SpO2: 96%   PHYSICAL EXAM: General:  Well appearing. No resp difficulty HEENT: normal Neck: supple. JVP flat Carotids 2+ bilaterally; no bruits. No lymphadenopathy or thryomegaly appreciated. Cor: PMI normal. Regular rate & rhythm. 2/6 TR Lungs: clear Abdomen: soft, nontender, mildly distended. No hepatosplenomegaly. No bruits or masses. Good bowel sounds. Extremities: no cyanosis, clubbing, rash, edema Neuro: alert & orientedx3, cranial nerves grossly intact. Moves all 4 extremities w/o difficulty. Affect pleasant.   ASSESSMENT & PLAN:  1. Chronic Systolic Heart Failure: ICM. EF 15% (05/2012). She has Medtronic ICD, not Optivol - NYHA II symptoms and volume status stable. Will continue lasix 40 mg daily.  - SBP too soft to titrate medications will continue coreg 6.25 mg BID, Lisinopril 5 mg BID and Spiro 25 mg daily. - Reviewed labs and last digoxing level 1.8. She reports that in January she had loss of vision of R eye and L eye was blurry. Has not had any more issues. Will cut digoxin back to 0.0625 QOD.    - Discussed in depth the concern she may need heart tx or LVAD in the future. Will repeat CPX test. If remains marginal would like to get her a tx evaluation since she is Type O and would have a long wait. Discussed with patient and she understands. - Interrogated ICD and no Afib. Patient activity ~6 hrs today. - Reinforced the need and importance of daily weights, a low sodium diet, and fluid restriction (less than 2 L a day). Instructed to call the HF clinic if weight increases more than 3 lbs overnight or 5 lbs in a week.  2. CAD-No ischemic symptoms.  Continue ASA, statin and BB.  3. CKD stage III.  Reviewed BMET from 12/14 Creatinine stable. 4. Hx drug abuse: Remains abstinent from drug use and ETOH for >2 years. Congratulated the patient.    Follow up in  3-4 months on MD side to review CPX.   Rande Brunt 04/25/2013

## 2013-05-23 NOTE — CV Procedure (Signed)
Cardiac Cath Procedure Note:  Indication:  Heart failure    Procedures performed:  1) Right heart catheterization  Description of procedure:   The risks and indication of the procedure were explained. Consent was signed and placed on the chart. An appropriate timeout was taken prior to the procedure. The right neck was prepped and draped in the routine sterile fashion and anesthetized with 1% local lidocaine.   A 7 FR venous sheath was placed in the right internal jugular vein using a modified Seldinger technique. A standard Swan-Ganz catheter was used for the procedure.   Complications: None apparent.  Findings:  RA = 7 RV = 55/1/8 PA = 60/22 (38) PCW = 23 (v = 35-40) Fick cardiac output/index = 4.7/2.5 Thermo CO/CI = 4.0/2.2 PVR = 3.8 Woods FA sat = 91%  PA sat = 54%, 54%  Assessment: 1. Mild to moderately elevated left-sided filling pressures with prominent v-waves suggestive of significant MR 2. Low-normal cardiac output  Plan/Discussion:  Continue medical treatment of her HF for now. If symptoms continue to worsen would repeat CPX.   [name] BensimhonMD 11:07 AM

## 2013-05-27 NOTE — Addendum Note (Signed)
Addended by: Hulan Fray on: 05/27/2013 05:22 PM   Modules accepted: Orders

## 2013-05-31 ENCOUNTER — Ambulatory Visit (HOSPITAL_COMMUNITY)
Admission: RE | Admit: 2013-05-31 | Discharge: 2013-05-31 | Disposition: A | Discharge status: Home / Self Care | Payer: Medicare Other | Source: Ambulatory Visit | Attending: Internal Medicine | Admitting: Internal Medicine

## 2013-05-31 ENCOUNTER — Telehealth (HOSPITAL_COMMUNITY): Payer: Self-pay | Admitting: Cardiology

## 2013-05-31 ENCOUNTER — Encounter (HOSPITAL_COMMUNITY): Payer: Self-pay | Admitting: *Deleted

## 2013-05-31 ENCOUNTER — Other Ambulatory Visit (HOSPITAL_COMMUNITY): Payer: Self-pay | Admitting: Adult Health

## 2013-05-31 VITALS — BP 90/56 | HR 64 | Wt 183.8 lb

## 2013-05-31 DIAGNOSIS — I2510 Atherosclerotic heart disease of native coronary artery without angina pectoris: Secondary | ICD-10-CM | POA: Diagnosis not present

## 2013-05-31 DIAGNOSIS — I059 Rheumatic mitral valve disease, unspecified: Secondary | ICD-10-CM | POA: Insufficient documentation

## 2013-05-31 DIAGNOSIS — E785 Hyperlipidemia, unspecified: Secondary | ICD-10-CM

## 2013-05-31 DIAGNOSIS — I340 Nonrheumatic mitral (valve) insufficiency: Secondary | ICD-10-CM

## 2013-05-31 DIAGNOSIS — I5022 Chronic systolic (congestive) heart failure: Secondary | ICD-10-CM

## 2013-05-31 MED ORDER — FUROSEMIDE 40 MG PO TABS: ORAL_TABLET | ORAL | Status: DC

## 2013-05-31 MED ORDER — POTASSIUM CHLORIDE CRYS ER 20 MEQ PO TBCR: 20.0000 meq | EXTENDED_RELEASE_TABLET | Freq: Every day | ORAL | Status: DC

## 2013-05-31 NOTE — Telephone Encounter (Signed)
Pt scheduled for TEE cpt code 46503 ICD 9 424.0 on 06/02/13 With pts current insurance PRIMARY medicare a and b- no pre cert req'd            SECONDARY medicaid- no pre cert req'd

## 2013-05-31 NOTE — Progress Notes (Signed)
Patient ID: [name], female   DOB: [birth year], 58 y.o.   MRN: [id number]  HPI: 58 year old female with history of HTN, DM2, polysubstance abuse (ETOH, tobacco, cocaine) and PAD. 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 At time of cath LAD, LCX and RCA occluded. EF 45%. Had abrupt stent occlusion the next day and had to go back to the lab. 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   [facility] 03/17/11  RA = 8  RV = 47/1/9  PA = 52/12 (28)  PCW = 20 no significant v-waves  Fick cardiac output/index = 4.2/2.2  PVR =1.6 Woods  FA sat = 95%  PA sat = 61%, 63%  Ao Pressure: 89/58 (71)  LV Pressure: 86/12/24  Severe native CAD with all bypass grafts patent   Echoes: 2011 EF 20-25% -> Medtronic ICD placed.  03/2011: Severe LV dysfunction EF 10-15%  11/17/11 EF 25-30% 05/19/2012  EF 15% 2/15 EF 15%, diffuse hypokinesis, restrictive diastolic function, s/p MV repair with moderate-severe MR.   04/10/11: CPX (on Carvedilol)  Peak VO2: 13.6 ml/kg/min predicted peak VO2: 63.3% VE/VCO2 slope: 44.9 OUES: 1.11Peak RER: 1.12   CPX: 06/17/12 Peak VO2 14.8 (predicted peak VO2 68.5%), VE/VCO2 slope 43.4 OUES: 1.19, Peak RER 1.12 Vent threshold 11.3 (pred peak VO2 52.3%)  CPX (3/15): peak VO2 15.6, VE/VO2 40.8, RER 1.2  RHC (3/15): Mean RA 7 PA 60/22, mean 38 Mean PCWP 23 with prominent v-waves CI 2.5 PVR 3.8  Follow up: Patient reports increased dyspnea x 2 months.  She is short of breath walking 50 feet such as going to mailbox.  She is short of breath walking up steps.  No chest pain.  No orthopnea or PND.  +Bendopnea. No palpitations.  Remains alcohol and drug free (over 2 years). Following low salt diet and drinking less than 2L a day. BP is 90/56 today, but overall she says that her orthostatic-type lightheadedness has actually improved over the last few months.  Recent RHC showed elevated right and left  heart filling pressures and primarily pulmonary venous hypertension.   Labs:  08/12/12 - on lipitor 40 mg daily Cholesterol 180 Triglyceride 195 HDL 29 LDL 112 K 4.5, creatinine 1.4 11/16/12 K 4.0 Labs 1.5 Pro BNP 181 02/14/13: K+ 4.6, Cr 1.33, Dig level 1.8, TSH 2.97 3/15: K 4.2, creatinine 1.07, HCT 32.4  ROS: All systems negative except as listed in HPI, PMH and Problem List.  Past Medical History  Diagnosis Date  . Coronary artery disease   . Diabetes mellitus   . Hypertension   . CHF (congestive heart failure)     Systolic.  EF 62-95%  . Polysubstance abuse     history of  (cocaine, tobacco and ETOH)    Current Outpatient Prescriptions  Medication Sig Dispense Refill  . allopurinol (ZYLOPRIM) 100 MG tablet Take 100-200 mg by mouth 2 (two) times daily. Takes 200 mg in the morning and 100 mg in the evening      . aspirin 81 MG chewable tablet Chew 81 mg by mouth daily.      [name] atorvastatin (LIPITOR) 80 MG tablet Take 1 tablet (80 mg total) by mouth daily.  30 tablet  11  . carvedilol (COREG) 6.25 MG tablet Take 6.25 mg by mouth 2 (two) times daily with a meal.      . digoxin (LANOXIN) 0.125 MG tablet Take  0.5 tablets (0.0625 mg total) by mouth every other day.  8 tablet  3  . furosemide (LASIX) 40 MG tablet Take 80 mg (2 tabs) in AM and 40 mg (1 tab) in PM  90 tablet  3  . glucose blood (ACCU-CHEK INSTANT PLUS TEST) test strip Use to test blood sugars 4 times a day. Insulin dependent. Dx code; 250.02.  120 each  12  . insulin glargine (LANTUS) 100 UNIT/ML injection Inject 0.38 mLs (38 Units total) into the skin at bedtime.  12 mL  12  . insulin lispro (HUMALOG) 100 UNIT/ML injection Inject 5-8 Units into the skin 3 (three) times daily before meals.      . Insulin Pen Needle (B-D ULTRAFINE III SHORT PEN) 31G X 8 MM MISC 1 Device by Does not apply route as directed.  130 each  11  . lisinopril (PRINIVIL,ZESTRIL) 5 MG tablet Take 5 mg by mouth 2 (two) times daily.      . nitroGLYCERIN  (NITROSTAT) 0.4 MG SL tablet Place 1 tablet (0.4 mg total) under the tongue every 5 (five) minutes as needed. For chest pain.  25 tablet  11  . spironolactone (ALDACTONE) 25 MG tablet Take 25 mg by mouth daily.      . potassium chloride SA (K-DUR,KLOR-CON) 20 MEQ tablet Take 1 tablet (20 mEq total) by mouth daily.  30 tablet  3  . [DISCONTINUED] sitaGLIPtin (JANUVIA) 25 MG tablet Take 1 tablet (25 mg total) by mouth daily.  30 tablet  1   No current facility-administered medications for this encounter.    Filed Vitals:   05/31/13 0914  BP: 90/56  Pulse: 64  Weight: 183 lb 12 oz (83.348 kg)  SpO2: 95%   PHYSICAL EXAM: General:  Well appearing. No resp difficulty HEENT: normal Neck: supple. JVP 12 cm.  Carotids 2+ bilaterally; no bruits. No lymphadenopathy or thryomegaly appreciated. Cor: PMI normal. Regular rate & rhythm. 3/6 HSM apex.  Lungs: clear Abdomen: soft, nontender, mildly distended. No hepatosplenomegaly. No bruits or masses. Good bowel sounds. Extremities: no cyanosis, clubbing, rash, edema Neuro: alert & orientedx3, cranial nerves grossly intact. Moves all 4 extremities w/o difficulty. Affect pleasant.   ASSESSMENT & PLAN:  1. Chronic Systolic Heart Failure: Ischemic cardiomyopathy. EF 15% (2/15). She has a Medtronic ICD that does not have Optivol. NYHA class III symptoms, worse over the last couple of months.  Weight is up 2 lbs.  She is volume overloaded on exam.  I wonder if her symptoms could have developed with worsening mitral regurgitation.  Recent RHC showed elevated right and left heart filling pressures and primarily pulmonary venous hypertension.  CPX in 3/15 showed mild to moderate functional limitation from cardiac dysfunction.  It actually looked a bit better than past studies.  - Increase Lasix to 80 qam, 40 qpm and add KCl 20 daily.   - BMET/BNP in 1 week.  - SBP too soft to titrate medications, will continue coreg 6.25 mg BID, Lisinopril 5 mg BID and Spiro  25 mg daily.  She does not have as marked orthostatic symptoms as she did in the past.  - She is on digoxin 0.0625 mg every other day. Check digoxin level with other labs in 1 week.  - Reinforced the need and importance of daily weights, a low sodium diet, and fluid restriction (less than 2 L a day). Instructed to call the HF clinic if weight increases more than 3 lbs overnight or 5 lbs in a week.  -  Return to office in 2 wks.  2. CAD: No chest pain.  Continue ASA, statin and BB. Will check lipids with labs in 1 week, goal LDL < 70.  3. Mitral regurgitation: Patient had mitral valve annuloplasty with CABG.  She has a significant MR murmur on exam and was noted to have prominent v-waves on recent RHC.  Moderate to severe MR on echo in 2/15.  I wonder if worsening of MR may not be involved in the recent symptomatic worsening she has had.  I would like to look more closely at her mitral valve.  Repeat cardiac surgery would be difficult given prior sternotomy + very low EF.  I am unsure whether the MV clip would be an option here and would like to get a good look at the valve.  I will arrange for TEE.  4. Hx drug abuse: Remains abstinent from drug use and ETOH for >2 years. Congratulated the patient.    Loralie Champagne 05/31/2013

## 2013-05-31 NOTE — Patient Instructions (Signed)
Increase Furosemide to 2 tabs in AM and 1 tab in PM  Start K-dur (potassium) 20 meq daily  Labs next Tuesday at [facility] on [address] recommends that you schedule a follow-up appointment in: 2 weeks  Your physician has requested that you have a TEE. During a TEE, sound waves are used to create images of your heart. It provides your doctor with information about the size and shape of your heart and how well your heart's chambers and valves are working. In this test, a transducer is attached to the end of a flexible tube that's guided down your throat and into your esophagus (the tube leading from you mouth to your stomach) to get a more detailed image of your heart. You are not awake for the procedure. Please see the instruction sheet given to you today. For further information please visit [email or web address].

## 2013-06-02 ENCOUNTER — Encounter (HOSPITAL_COMMUNITY)
Admission: RE | Disposition: A | Discharge status: Home / Self Care | Payer: Medicare Other | Source: Ambulatory Visit | Attending: Cardiology

## 2013-06-02 ENCOUNTER — Ambulatory Visit (HOSPITAL_COMMUNITY)
Admission: RE | Admit: 2013-06-02 | Discharge: 2013-06-02 | Disposition: A | Discharge status: Home / Self Care | Payer: Medicare Other | Source: Ambulatory Visit | Attending: Cardiology | Admitting: Cardiology

## 2013-06-02 ENCOUNTER — Encounter (HOSPITAL_COMMUNITY): Payer: Self-pay | Admitting: *Deleted

## 2013-06-02 DIAGNOSIS — I252 Old myocardial infarction: Secondary | ICD-10-CM | POA: Insufficient documentation

## 2013-06-02 DIAGNOSIS — E119 Type 2 diabetes mellitus without complications: Secondary | ICD-10-CM | POA: Insufficient documentation

## 2013-06-02 DIAGNOSIS — I059 Rheumatic mitral valve disease, unspecified: Secondary | ICD-10-CM | POA: Insufficient documentation

## 2013-06-02 DIAGNOSIS — I509 Heart failure, unspecified: Secondary | ICD-10-CM | POA: Insufficient documentation

## 2013-06-02 DIAGNOSIS — I519 Heart disease, unspecified: Secondary | ICD-10-CM | POA: Insufficient documentation

## 2013-06-02 DIAGNOSIS — I5022 Chronic systolic (congestive) heart failure: Secondary | ICD-10-CM | POA: Insufficient documentation

## 2013-06-02 DIAGNOSIS — I10 Essential (primary) hypertension: Secondary | ICD-10-CM | POA: Insufficient documentation

## 2013-06-02 DIAGNOSIS — Z7982 Long term (current) use of aspirin: Secondary | ICD-10-CM | POA: Insufficient documentation

## 2013-06-02 DIAGNOSIS — I739 Peripheral vascular disease, unspecified: Secondary | ICD-10-CM | POA: Insufficient documentation

## 2013-06-02 DIAGNOSIS — Z794 Long term (current) use of insulin: Secondary | ICD-10-CM | POA: Insufficient documentation

## 2013-06-02 DIAGNOSIS — I2589 Other forms of chronic ischemic heart disease: Secondary | ICD-10-CM | POA: Insufficient documentation

## 2013-06-02 DIAGNOSIS — I2510 Atherosclerotic heart disease of native coronary artery without angina pectoris: Secondary | ICD-10-CM | POA: Insufficient documentation

## 2013-06-02 DIAGNOSIS — I2789 Other specified pulmonary heart diseases: Secondary | ICD-10-CM | POA: Insufficient documentation

## 2013-06-02 DIAGNOSIS — Z87891 Personal history of nicotine dependence: Secondary | ICD-10-CM | POA: Insufficient documentation

## 2013-06-02 DIAGNOSIS — Z79899 Other long term (current) drug therapy: Secondary | ICD-10-CM | POA: Insufficient documentation

## 2013-06-02 DIAGNOSIS — F1411 Cocaine abuse, in remission: Secondary | ICD-10-CM | POA: Insufficient documentation

## 2013-06-02 DIAGNOSIS — F1011 Alcohol abuse, in remission: Secondary | ICD-10-CM | POA: Insufficient documentation

## 2013-06-02 DIAGNOSIS — I340 Nonrheumatic mitral (valve) insufficiency: Secondary | ICD-10-CM

## 2013-06-02 HISTORY — PX: TEE WITHOUT CARDIOVERSION: SHX5443

## 2013-06-02 LAB — GLUCOSE, CAPILLARY
Glucose-Capillary: 72 mg/dL (ref 70–99)
Glucose-Capillary: 73 mg/dL (ref 70–99)
Glucose-Capillary: 105 mg/dL — ABNORMAL HIGH (ref 70–99)

## 2013-06-02 SURGERY — ECHOCARDIOGRAM, TRANSESOPHAGEAL
Anesthesia: Moderate Sedation

## 2013-06-02 MED ORDER — DEXTROSE 5 % IV BOLUS
250.0000 mL | Freq: Once | INTRAVENOUS | Status: AC
  Administered 2013-06-02: 500 mL via INTRAVENOUS

## 2013-06-02 MED ORDER — MIDAZOLAM HCL 10 MG/2ML IJ SOLN
INTRAMUSCULAR | Status: DC | PRN
  Administered 2013-06-02 (×2): 1 mg via INTRAVENOUS

## 2013-06-02 MED ORDER — BUTAMBEN-TETRACAINE-BENZOCAINE 2-2-14 % EX AERO
INHALATION_SPRAY | CUTANEOUS | Status: DC | PRN
  Administered 2013-06-02: 2 via TOPICAL

## 2013-06-02 MED ORDER — CARVEDILOL 6.25 MG PO TABS: 3.1250 mg | ORAL_TABLET | Freq: Two times a day (BID) | ORAL | Status: DC

## 2013-06-02 MED ORDER — SODIUM CHLORIDE 0.9 % IV SOLN
INTRAVENOUS | Status: DC
  Administered 2013-06-02: 13:00:00 via INTRAVENOUS

## 2013-06-02 MED ORDER — FENTANYL CITRATE 0.05 MG/ML IJ SOLN
INTRAMUSCULAR | Status: DC | PRN
  Administered 2013-06-02: 25 ug via INTRAVENOUS
  Administered 2013-06-02: 12.5 ug via INTRAVENOUS

## 2013-06-02 MED ORDER — FENTANYL CITRATE 0.05 MG/ML IJ SOLN
INTRAMUSCULAR | Status: AC
  Filled 2013-06-02: qty 2

## 2013-06-02 MED ORDER — MIDAZOLAM HCL 5 MG/ML IJ SOLN
INTRAMUSCULAR | Status: AC
  Filled 2013-06-02: qty 2

## 2013-06-02 NOTE — Discharge Instructions (Signed)
Transesophageal Echocardiography Transesophageal echocardiography (TEE) is a special type of test that produces images of the heart by using sound waves (echocardiogram). This type of echocardiography can obtain better images of the heart than standard echocardiography. TEE is done by passing a flexible tube down the esophagus. The heart is located in front of the esophagus. Because the heart and esophagus are close to one another, your health care provider can take very clear, detailed pictures of the heart via ultrasound waves. TEE may be done:  If your health care provider needs more information based on standard echocardiography findings.  If you had a stroke. This might have happened because a clot formed in your heart. TEE can visualize different areas of the heart and check for clots.  To check valve anatomy and function.  To check for infection on the inside of your heart (endocarditis).  To evaluate the dividing wall (septum) of the heart and presence of a hole that did not close after birth (patent foramen ovale or atrial septal defect).  To help diagnose a tear in the wall of the aorta (aortic dissection).  During cardiac valve surgery. This allows the surgeon to assess the valve repair before closing the chest.  During a variety of other cardiac procedures to guide positioning of catheters.  Sometimes before a cardioversion, which is a shock to convert heart rhythm back to normal. LET [facility] CARE PROVIDER KNOW ABOUT:   Any allergies you have.  All medicines you are taking, including vitamins, herbs, eye drops, creams, and over-the-counter medicines.  Previous problems you or members of your family have had with the use of anesthetics.  Any blood disorders you have.  Previous surgeries you have had.  Medical conditions you have.  Swallowing difficulties.  An esophageal obstruction. RISKS AND COMPLICATIONS  Generally, TEE is a safe procedure. However, as with any  procedure, complications can occur. Possible complications include an esophageal tear (rupture). BEFORE THE PROCEDURE   Do not eat or drink for 6 hours before the procedure or as directed by your health care provider.  Arrange for someone to drive you home after the procedure. Do not drive yourself home. During the procedure, you will be given medicines that can continue to make you feel drowsy and can impair your reflexes.  An IV access tube will be started in the arm. PROCEDURE   A medicine to help you relax (sedative) will be given through the IV access tube.  A medicine that numbs the area (local anesthetic) may be sprayed in the back of the throat.  Your blood pressure, heart rate, and breathing (vital signs) will be monitored during the procedure.  The TEE probe is a long, flexible tube. The tip of the probe is placed into the back of the mouth, and you will be asked to swallow. This helps to pass the tip of the probe into the esophagus. Once the tip of the probe is in the correct area, your health care provider can take pictures of the heart.  TEE is usually not a painful procedure. You may feel the probe press against the back of the throat. The probe does not enter the trachea and does not affect your breathing.  Your time spent at the hospital is usually less than 2 hours. AFTER THE PROCEDURE   You will be in bed, resting, until you have fully returned to consciousness.  When you first awaken, your throat may feel slightly sore and will probably still feel numb. This will  improve slowly over time.  You will not be allowed to eat or drink until it is clear that the numbness has improved.  Once you have been able to drink, urinate, and sit on the edge of the bed without feeling sick to your stomach (nausea) or dizzy, you may be cleared to go home.  You should have a friend or family member with you for the next 24 hours after your procedure. Document Released: 05/10/2002  Document Revised: 12/08/2012 Document Reviewed: 08/19/2012 [facility] Patient Information 2014 [employer] [place], [place]. Moderate Sedation, Adult Moderate sedation is given to help you relax or even sleep through a procedure. You may remain sleepy, be clumsy, or have poor balance for several hours following this procedure. Arrange for a responsible adult, family member, or friend to take you home. A responsible adult should stay with you for at least 24 hours or until the medicines have worn off.  Do not participate in any activities where you could become injured for the next 24 hours, or until you feel normal again. Do not:  Drive.  Swim.  Ride a bicycle.  Operate heavy machinery.  Cook.  Use power tools.  Climb ladders.  Work at [employer].  Do not make important decisions or sign legal documents until you are improved.  Vomiting may occur if you eat too soon. When you can drink without vomiting, try water, juice, or soup. Try solid foods if you feel little or no nausea.  Only take over-the-counter or prescription medications for pain, discomfort, or fever as directed by your caregiver.If pain medications have been prescribed for you, ask your caregiver how soon it is safe to take them.  Make sure you and your family fully understands everything about the medication given to you. Make sure you understand what side effects may occur.  You should not drink alcohol, take sleeping pills, or medications that cause drowsiness for at least 24 hours.  If you smoke, do not smoke alone.  If you are feeling better, you may resume normal activities 24 hours after receiving sedation.  Keep all appointments as scheduled. Follow all instructions.  Ask questions if you do not understand. SEEK MEDICAL CARE IF:   Your skin is pale or bluish in color.  You continue to feel sick to your stomach (nauseous) or throw up (vomit).  Your pain is getting worse and not helped by medication.  You have  bleeding or swelling.  You are still sleepy or feeling clumsy after 24 hours. SEEK IMMEDIATE MEDICAL CARE IF:   You develop a rash.  You have difficulty breathing.  You develop any type of allergic problem.  You have a fever. Document Released: 11/12/2000 Document Revised: 05/12/2011 Document Reviewed: 10/25/2012 [facility] Patient Information 2014 [place].

## 2013-06-02 NOTE — CV Procedure (Addendum)
Procedure: TEE  Indication: Mitral regurgitation, ? Severe  Sedation: Versed 2 mg IV, Fentanyl 37.5 mcg   Findings: Please see echo section for full report.  Moderately dilated LV with severe systolic dysfunction, EF 19%. The mitral valve has been repaired.  The posterior leaflet is dysfunctional and poorly mobile.  It does not fully coapt with the anterior leaflet.  There does appear to be severe MR.  There is no significant mitral stenosis (mean gradient 6 mmHg, likely elevated due to high flow from MR).  There was systolic flattening in the pulmonary vein doppler pattern.  No LAA thrombus.  The RV was mildly dilated with mildly decreased systolic function.  Of note, there was some thrombus adherent to the ICD.    Impression: Probably severe MR. The anatomy of the repaired mitral valve does not look suitable to mitral valve clipping and she would be high risk for MV replacement.  I will discuss with Dr. Haroldine Laws.  BP continues to run low, will decrease Coreg to 3.125 mg bid.   [name] 06/02/2013 2:29 PM

## 2013-06-02 NOTE — Interval H&P Note (Signed)
History and Physical Interval Note:  06/02/2013 1:48 PM  [name]  has presented today for surgery, with the diagnosis of mitral regurgitation  The various methods of treatment have been discussed with the patient and family. After consideration of risks, benefits and other options for treatment, the patient has consented to  Procedure(s): TRANSESOPHAGEAL ECHOCARDIOGRAM (TEE) (N/A) as a surgical intervention .  The patient's history has been reviewed, patient examined, no change in status, stable for surgery.  I have reviewed the patient's chart and labs.  Questions were answered to the patient's satisfaction.     Tonga Prout [employer]

## 2013-06-02 NOTE — H&P (View-Only) (Signed)
Patient ID: [name], female   DOB: [birth year], 58 y.o.   MRN: [id number]  HPI: 58 year old female with history of HTN, DM2, polysubstance abuse (ETOH, tobacco, cocaine) and PAD. 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 At time of cath LAD, LCX and RCA occluded. EF 45%. Had abrupt stent occlusion the next day and had to go back to the lab. 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   [facility] 03/17/11  RA = 8  RV = 47/1/9  PA = 52/12 (28)  PCW = 20 no significant v-waves  Fick cardiac output/index = 4.2/2.2  PVR =1.6 [place]  FA sat = 95%  PA sat = 61%, 63%  Ao Pressure: 89/58 (71)  LV Pressure: 86/12/24  Severe native CAD with all bypass grafts patent   Echoes: 2011 EF 20-25% -> Medtronic ICD placed.  03/2011: Severe LV dysfunction EF 10-15%  11/17/11 EF 25-30% 05/19/2012  EF 15% 2/15 EF 15%, diffuse hypokinesis, restrictive diastolic function, s/p MV repair with moderate-severe MR.   04/10/11: CPX (on Carvedilol)  Peak VO2: 13.6 ml/kg/min predicted peak VO2: 63.3% VE/VCO2 slope: 44.9 OUES: 1.11Peak RER: 1.12   CPX: 06/17/12 Peak VO2 14.8 (predicted peak VO2 68.5%), VE/VCO2 slope 43.4 OUES: 1.19, Peak RER 1.12 Vent threshold 11.3 (pred peak VO2 52.3%)  CPX (3/15): peak VO2 15.6, VE/VO2 40.8, RER 1.2  RHC (3/15): Mean RA 7 PA 60/22, mean 38 Mean PCWP 23 with prominent v-waves CI 2.5 PVR 3.8  Follow up: Patient reports increased dyspnea x 2 months.  She is short of breath walking 50 feet such as going to mailbox.  She is short of breath walking up steps.  No chest pain.  No orthopnea or PND.  +Bendopnea. No palpitations.  Remains alcohol and drug free (over 2 years). Following low salt diet and drinking less than 2L a day. BP is 90/56 today, but overall she says that her orthostatic-type lightheadedness has actually improved over the last few months.  Recent RHC showed elevated right and left  heart filling pressures and primarily pulmonary venous hypertension.   Labs:  08/12/12 - on lipitor 40 mg daily Cholesterol 180 Triglyceride 195 HDL 29 LDL 112 K 4.5, creatinine 1.4 11/16/12 K 4.0 Labs 1.5 Pro BNP 181 02/14/13: K+ 4.6, Cr 1.33, Dig level 1.8, TSH 2.97 3/15: K 4.2, creatinine 1.07, HCT 32.4  ROS: All systems negative except as listed in HPI, PMH and Problem List.  Past Medical History  Diagnosis Date  . Coronary artery disease   . Diabetes mellitus   . Hypertension   . CHF (congestive heart failure)     Systolic.  EF 00-93%  . Polysubstance abuse     history of  (cocaine, tobacco and ETOH)    Current Outpatient Prescriptions  Medication Sig Dispense Refill  . allopurinol (ZYLOPRIM) 100 MG tablet Take 100-200 mg by mouth 2 (two) times daily. Takes 200 mg in the morning and 100 mg in the evening      . aspirin 81 MG chewable tablet Chew 81 mg by mouth daily.      [name] atorvastatin (LIPITOR) 80 MG tablet Take 1 tablet (80 mg total) by mouth daily.  30 tablet  11  . carvedilol (COREG) 6.25 MG tablet Take 6.25 mg by mouth 2 (two) times daily with a meal.      . digoxin (LANOXIN) 0.125 MG tablet Take  0.5 tablets (0.0625 mg total) by mouth every other day.  8 tablet  3  . furosemide (LASIX) 40 MG tablet Take 80 mg (2 tabs) in AM and 40 mg (1 tab) in PM  90 tablet  3  . glucose blood (ACCU-CHEK INSTANT PLUS TEST) test strip Use to test blood sugars 4 times a day. Insulin dependent. Dx code; 250.02.  120 each  12  . insulin glargine (LANTUS) 100 UNIT/ML injection Inject 0.38 mLs (38 Units total) into the skin at bedtime.  12 mL  12  . insulin lispro (HUMALOG) 100 UNIT/ML injection Inject 5-8 Units into the skin 3 (three) times daily before meals.      . Insulin Pen Needle (B-D ULTRAFINE III SHORT PEN) 31G X 8 MM MISC 1 Device by Does not apply route as directed.  130 each  11  . lisinopril (PRINIVIL,ZESTRIL) 5 MG tablet Take 5 mg by mouth 2 (two) times daily.      . nitroGLYCERIN  (NITROSTAT) 0.4 MG SL tablet Place 1 tablet (0.4 mg total) under the tongue every 5 (five) minutes as needed. For chest pain.  25 tablet  11  . spironolactone (ALDACTONE) 25 MG tablet Take 25 mg by mouth daily.      . potassium chloride SA (K-DUR,KLOR-CON) 20 MEQ tablet Take 1 tablet (20 mEq total) by mouth daily.  30 tablet  3  . [DISCONTINUED] sitaGLIPtin (JANUVIA) 25 MG tablet Take 1 tablet (25 mg total) by mouth daily.  30 tablet  1   No current facility-administered medications for this encounter.    Filed Vitals:   05/31/13 0914  BP: 90/56  Pulse: 64  Weight: 183 lb 12 oz (83.348 kg)  SpO2: 95%   PHYSICAL EXAM: General:  Well appearing. No resp difficulty HEENT: normal Neck: supple. JVP 12 cm.  Carotids 2+ bilaterally; no bruits. No lymphadenopathy or thryomegaly appreciated. Cor: PMI normal. Regular rate & rhythm. 3/6 HSM apex.  Lungs: clear Abdomen: soft, nontender, mildly distended. No hepatosplenomegaly. No bruits or masses. Good bowel sounds. Extremities: no cyanosis, clubbing, rash, edema Neuro: alert & orientedx3, cranial nerves grossly intact. Moves all 4 extremities w/o difficulty. Affect pleasant.   ASSESSMENT & PLAN:  1. Chronic Systolic Heart Failure: Ischemic cardiomyopathy. EF 15% (2/15). She has a Medtronic ICD that does not have Optivol. NYHA class III symptoms, worse over the last couple of months.  Weight is up 2 lbs.  She is volume overloaded on exam.  I wonder if her symptoms could have developed with worsening mitral regurgitation.  Recent RHC showed elevated right and left heart filling pressures and primarily pulmonary venous hypertension.  CPX in 3/15 showed mild to moderate functional limitation from cardiac dysfunction.  It actually looked a bit better than past studies.  - Increase Lasix to 80 qam, 40 qpm and add KCl 20 daily.   - BMET/BNP in 1 week.  - SBP too soft to titrate medications, will continue coreg 6.25 mg BID, Lisinopril 5 mg BID and Spiro  25 mg daily.  She does not have as marked orthostatic symptoms as she did in the past.  - She is on digoxin 0.0625 mg every other day. Check digoxin level with other labs in 1 week.  - Reinforced the need and importance of daily weights, a low sodium diet, and fluid restriction (less than 2 L a day). Instructed to call the HF clinic if weight increases more than 3 lbs overnight or 5 lbs in a week.  -  Return to office in 2 wks.  2. CAD: No chest pain.  Continue ASA, statin and BB. Will check lipids with labs in 1 week, goal LDL < 70.  3. Mitral regurgitation: Patient had mitral valve annuloplasty with CABG.  She has a significant MR murmur on exam and was noted to have prominent v-waves on recent RHC.  Moderate to severe MR on echo in 2/15.  I wonder if worsening of MR may not be involved in the recent symptomatic worsening she has had.  I would like to look more closely at her mitral valve.  Repeat cardiac surgery would be difficult given prior sternotomy + very low EF.  I am unsure whether the MV clip would be an option here and would like to get a good look at the valve.  I will arrange for TEE.  4. Hx drug abuse: Remains abstinent from drug use and ETOH for >2 years. Congratulated the patient.    Loralie Champagne 05/31/2013

## 2013-06-02 NOTE — Progress Notes (Signed)
Echocardiogram Echocardiogram Transesophageal has been performed.  [name] 06/02/2013, 2:49 PM

## 2013-06-06 ENCOUNTER — Encounter (HOSPITAL_COMMUNITY): Payer: Self-pay | Admitting: Cardiology

## 2013-06-07 ENCOUNTER — Other Ambulatory Visit (INDEPENDENT_AMBULATORY_CARE_PROVIDER_SITE_OTHER): Payer: Medicare Other | Admitting: *Deleted

## 2013-06-07 ENCOUNTER — Ambulatory Visit (INDEPENDENT_AMBULATORY_CARE_PROVIDER_SITE_OTHER): Payer: Medicare Other | Admitting: *Deleted

## 2013-06-07 ENCOUNTER — Encounter: Payer: Self-pay | Admitting: Dietician

## 2013-06-07 DIAGNOSIS — I5022 Chronic systolic (congestive) heart failure: Secondary | ICD-10-CM

## 2013-06-07 DIAGNOSIS — E785 Hyperlipidemia, unspecified: Secondary | ICD-10-CM

## 2013-06-07 DIAGNOSIS — R0602 Shortness of breath: Secondary | ICD-10-CM | POA: Diagnosis not present

## 2013-06-07 DIAGNOSIS — Z95810 Presence of automatic (implantable) cardiac defibrillator: Secondary | ICD-10-CM

## 2013-06-07 LAB — BASIC METABOLIC PANEL
BUN: 50 mg/dL — AB (ref 6–23)
CALCIUM: 9.6 mg/dL (ref 8.4–10.5)
CO2: 26 meq/L (ref 19–32)
CREATININE: 1.5 mg/dL — AB (ref 0.4–1.2)
Chloride: 102 mEq/L (ref 96–112)
GFR: 45.51 mL/min — AB (ref >60.00)
GLUCOSE: 153 mg/dL — AB (ref 70–99)
Potassium: 4.9 mEq/L (ref 3.5–5.1)
Sodium: 138 mEq/L (ref 135–145)

## 2013-06-07 LAB — LIPID PANEL
CHOL/HDL RATIO: 4
Cholesterol: 140 mg/dL (ref 0–200)
HDL: 36.4 mg/dL — ABNORMAL LOW (ref >39.00)
LDL Cholesterol: 82 mg/dL (ref 0–99)
Triglycerides: 106 mg/dL (ref 0.0–149.0)
VLDL: 21.2 mg/dL (ref 0.0–40.0)

## 2013-06-08 LAB — MDC_IDC_ENUM_SESS_TYPE_REMOTE
Battery Voltage: 3.08 V
Brady Statistic RV Percent Paced: 0.02 %
HIGH POWER IMPEDANCE MEASURED VALUE: 39 Ohm
HIGH POWER IMPEDANCE MEASURED VALUE: 456 Ohm
HIGH POWER IMPEDANCE MEASURED VALUE: 50 Ohm
HighPow Impedance: 228 Ohm
Lead Channel Impedance Value: 532 Ohm
Lead Channel Setting Pacing Amplitude: 2.5 V
Lead Channel Setting Pacing Pulse Width: 0.4 ms
Lead Channel Setting Sensing Sensitivity: 0.45 mV
MDC IDC MSMT LEADCHNL RV SENSING INTR AMPL: 13.625 mV
MDC IDC MSMT LEADCHNL RV SENSING INTR AMPL: 13.625 mV
MDC IDC SESS DTM: 20150407192455
MDC IDC SET ZONE DETECTION INTERVAL: 290 ms
MDC IDC SET ZONE DETECTION INTERVAL: 360 ms
Zone Setting Detection Interval: 370 ms

## 2013-06-08 LAB — PRO B NATRIURETIC PEPTIDE: PRO B NATRI PEPTIDE: 899.8 pg/mL — AB (ref <126)

## 2013-06-08 LAB — DIGOXIN LEVEL: DIGOXIN LVL: 0.3 ng/mL — AB (ref 0.8–2.0)

## 2013-06-14 ENCOUNTER — Encounter (HOSPITAL_COMMUNITY): Payer: Self-pay

## 2013-06-16 ENCOUNTER — Ambulatory Visit (HOSPITAL_COMMUNITY)
Admission: RE | Admit: 2013-06-16 | Discharge: 2013-06-16 | Disposition: A | Discharge status: Home / Self Care | Payer: Medicare Other | Source: Ambulatory Visit | Attending: Internal Medicine | Admitting: Internal Medicine

## 2013-06-16 VITALS — BP 92/40 | HR 71 | Wt 182.8 lb

## 2013-06-16 DIAGNOSIS — I5022 Chronic systolic (congestive) heart failure: Secondary | ICD-10-CM | POA: Diagnosis not present

## 2013-06-16 DIAGNOSIS — I059 Rheumatic mitral valve disease, unspecified: Secondary | ICD-10-CM | POA: Diagnosis not present

## 2013-06-16 DIAGNOSIS — I340 Nonrheumatic mitral (valve) insufficiency: Secondary | ICD-10-CM

## 2013-06-16 NOTE — Progress Notes (Signed)
Patient ID: [name], female   DOB: 10/01/1955, 58 y.o.   MRN: [id number]  PCP: Dr. Nicoletta Dress (Internal Medicine)  HPI: 58 year old female with history of HTN, DM2, polysubstance abuse (ETOH, tobacco, cocaine) and PAD. 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 At time of cath LAD, LCX and RCA occluded. EF 45%. Had abrupt stent occlusion the next day and had to go back to the lab. 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   [facility] 03/17/11  Severe native CAD with all bypass grafts patent   Echos: 2011 EF 20-25% -> Medtronic ICD placed.  03/2011: Severe LV dysfunction EF 10-15%  11/17/11 EF 25-30% 05/19/2012  EF 15% 2/15 EF 15%, diffuse hypokinesis, restrictive diastolic function, s/p MV repair with moderate-severe MR.   04/10/11: CPX (on Carvedilol)  Peak VO2: 13.6 ml/kg/min (predicted peak VO2: 63.3%) VE/VCO2 slope: 44.9 OUES: 1.11 Peak RER: 1.12   CPX: 06/17/12 Peak VO2 14.8 (predicted peak VO2 68.5%), VE/VCO2 slope 43.4 OUES: 1.19, Peak RER 1.12 Vent threshold 11.3 (pred peak VO2 52.3%)  CPX (3/15): peak VO2 15.6, VE/VO2 40.8, RER 1.2  RHC (3/15): Mean RA 7 PA 60/22, mean 38 Mean PCWP 23 with prominent v-waves CI 2.5 PVR 3.8  Follow up: Since last visit had TEE showing mod/severe MR with restricted posterior leaflet. Last visit increased lasix to 80 mg q am and 40 mg q pm. Doing well. SOB has improved along with light headedness until this am had a little dizziness. Back to baseline and able to walk around grocery store without stopping. Denies CP, orthopnea, PND, or edema. Following low salt diet and drinking less than 2L a day.   MDT device interrogation shows No AF or VT. Activity 6 hr/day (no optivol on device)   Labs:  08/12/12 - on lipitor 40 mg daily Cholesterol 180 Triglyceride 195 HDL 29 LDL 112 K 4.5, creatinine 1.4 11/16/12 K 4.0 Labs 1.5 Pro BNP 181 02/14/13: K+ 4.6, Cr 1.33, Dig level 1.8,  TSH 2.97 3/15: K 4.2, creatinine 1.07, HCT 32.4 06/2013: Dig level 0.3, pro-BNP 899, Cholesterol 140, TG 106, HDL 36, LDL 82, Cr 1.5, K+ 4.9  ROS: All systems negative except as listed in HPI, PMH and Problem List.  Past Medical History  Diagnosis Date  . Coronary artery disease   . Diabetes mellitus   . Hypertension   . CHF (congestive heart failure)     Systolic.  EF 56-38%  . Polysubstance abuse     history of  (cocaine, tobacco and ETOH)    Current Outpatient Prescriptions  Medication Sig Dispense Refill  . allopurinol (ZYLOPRIM) 100 MG tablet Take 100-200 mg by mouth 2 (two) times daily. Takes 200 mg in the morning and 100 mg in the evening      . aspirin 81 MG chewable tablet Chew 81 mg by mouth daily.      [name] atorvastatin (LIPITOR) 80 MG tablet Take 1 tablet (80 mg total) by mouth daily.  30 tablet  11  . carvedilol (COREG) 6.25 MG tablet Take 0.5 tablets (3.125 mg total) by mouth 2 (two) times daily with a meal.  60 tablet  3  . digoxin (LANOXIN) 0.125 MG tablet Take 0.5 tablets (0.0625 mg total) by mouth every other day.  8 tablet  3  . furosemide (LASIX) 40 MG tablet Take 80 mg (2 tabs) in AM and 40 mg (1  tab) in PM  90 tablet  3  . glucose blood (ACCU-CHEK INSTANT PLUS TEST) test strip Use to test blood sugars 4 times a day. Insulin dependent. Dx code; 250.02.  120 each  12  . insulin glargine (LANTUS) 100 UNIT/ML injection Inject 0.38 mLs (38 Units total) into the skin at bedtime.  12 mL  12  . insulin lispro (HUMALOG) 100 UNIT/ML injection Inject 5-8 Units into the skin 3 (three) times daily before meals.      . Insulin Pen Needle (B-D ULTRAFINE III SHORT PEN) 31G X 8 MM MISC 1 Device by Does not apply route as directed.  130 each  11  . lisinopril (PRINIVIL,ZESTRIL) 5 MG tablet Take 2.5 mg by mouth 2 (two) times daily.       . nitroGLYCERIN (NITROSTAT) 0.4 MG SL tablet Place 1 tablet (0.4 mg total) under the tongue every 5 (five) minutes as needed. For chest pain.  25 tablet   11  . potassium chloride SA (K-DUR,KLOR-CON) 20 MEQ tablet Take 1 tablet (20 mEq total) by mouth daily.  30 tablet  3  . spironolactone (ALDACTONE) 25 MG tablet Take 25 mg by mouth daily.      . [DISCONTINUED] sitaGLIPtin (JANUVIA) 25 MG tablet Take 1 tablet (25 mg total) by mouth daily.  30 tablet  1   No current facility-administered medications for this encounter.    Filed Vitals:   06/16/13 1359  BP: 92/40  Pulse: 71  Weight: 182 lb 12 oz (82.895 kg)  SpO2: 96%   PHYSICAL EXAM: General:  Well appearing. No resp difficulty HEENT: normal Neck: supple. JVP 6-7 cm.  Carotids 2+ bilaterally; no bruits. No lymphadenopathy or thryomegaly appreciated. Cor: PMI normal. Regular rate & rhythm. 3/6 HSM apex.  Lungs: clear Abdomen: soft, nontender, mildly distended. No hepatosplenomegaly. No bruits or masses. Good bowel sounds. Extremities: no cyanosis, clubbing, rash, edema Neuro: alert & orientedx3, cranial nerves grossly intact. Moves all 4 extremities w/o difficulty. Affect pleasant.   ASSESSMENT & PLAN:  1. Chronic Systolic Heart Failure: Ischemic cardiomyopathy. EF 15% (2/15). She has a Medtronic ICD that does not have Optivol.  - Patient is doing much better with increase in lasix. NYHA II symptoms and volume status stable. Will continue lasix 80 mg q am and 40 mg q pm. Continue KCL 20 meq daily. - SBP too soft to titrate medications. Dizziness much improved, but still has occasionally. Instructed to call if she starts having more dizziness and can cut lisinopril back. - Continue coreg 6.25 mg BID, lisinopril 5 mg BID, digoxin 0.0625 mg QOD and spiro 25 mg daily. - Reinforced the need and importance of daily weights, a low sodium diet, and fluid restriction (less than 2 L a day). Instructed to call the HF clinic if weight increases more than 3 lbs overnight or 5 lbs in a week.  2. CAD: No chest pain.  Continue ASA, statin and BB. Will check lipids with labs in 1 week, goal LDL < 70.   3. Mitral regurgitation: Patient had mitral valve annuloplasty with CABG.  She has a significant MR murmur on exam and was noted to have prominent v-waves on recent RHC.  Moderate to severe MR on echo in 2/15.  I wonder if worsening of MR may not be involved in the recent symptomatic worsening she has had.  I would like to look more closely at her mitral valve.  Repeat cardiac surgery would be difficult given prior sternotomy + very low  EF.  I am unsure whether the MV clip would be an option here and would like to get a good look at the valve.  I will arrange for TEE.  4. Hx drug abuse: Remains abstinent from drug use and ETOH for >2 years. Congratulated the patient.    Rande Brunt 06/16/2013   Patient seen and examined with Junie Bame, NP. We discussed all aspects of the encounter. I agree with the assessment and plan as stated above.   I have reviewed echo images personally and all CPXs and RHCs. I feel MR is moderate to severe and probably not amenable to clipping. Over the past 2 years her functional capacity has improved slowly on CPX. That said remains borderline for advanced therapies. Symptomatically much improved with increased lasix. Will continue aggressive medical therapy though BP too low to titrate meds further today. Discuss possible need for advanced therapies down the road.   Total time spent 40 minutes. Over half that time spent discussing above.   Shaune Pascal Bensimhon,MD 4:07 PM

## 2013-06-16 NOTE — Patient Instructions (Signed)
Follow up in 2 months.  Call any dizziness  Do the following things EVERYDAY: 1) Weigh yourself in the morning before breakfast. Write it down and keep it in a log. 2) Take your medicines as prescribed 3) Eat low salt foods-Limit salt (sodium) to 2000 mg per day.  4) Stay as active as you can everyday 5) Limit all fluids for the day to less than 2 liters 6)

## 2013-06-20 ENCOUNTER — Other Ambulatory Visit: Payer: Self-pay | Admitting: *Deleted

## 2013-06-20 NOTE — Telephone Encounter (Signed)
Do u need a hand signature? If you need, I will need to go to the clinic to write one. Let me know  Nicoletta Dress

## 2013-06-20 NOTE — Telephone Encounter (Signed)
Pharmacy needs hand written signature. Pt has enough strips for 2 days. Hilda Blades Deira Shimer RN 06/20/13 4PM

## 2013-06-22 ENCOUNTER — Encounter: Payer: Self-pay | Admitting: Internal Medicine

## 2013-06-22 MED ORDER — GLUCOSE BLOOD VI STRP: ORAL_STRIP | Status: DC

## 2013-06-22 NOTE — Telephone Encounter (Signed)
Rx faxed to pharmacy - has signature Dr Nicoletta Dress - pt aware.

## 2013-06-29 ENCOUNTER — Other Ambulatory Visit (HOSPITAL_COMMUNITY): Payer: Self-pay | Admitting: Cardiology

## 2013-06-29 DIAGNOSIS — I5022 Chronic systolic (congestive) heart failure: Secondary | ICD-10-CM

## 2013-06-29 MED ORDER — POTASSIUM CHLORIDE CRYS ER 20 MEQ PO TBCR: 20.0000 meq | EXTENDED_RELEASE_TABLET | Freq: Every day | ORAL | Status: DC

## 2013-06-30 ENCOUNTER — Encounter: Payer: Self-pay | Admitting: *Deleted

## 2013-07-11 ENCOUNTER — Other Ambulatory Visit (HOSPITAL_COMMUNITY): Payer: Self-pay | Admitting: Cardiology

## 2013-07-11 DIAGNOSIS — E785 Hyperlipidemia, unspecified: Secondary | ICD-10-CM

## 2013-07-11 MED ORDER — ATORVASTATIN CALCIUM 80 MG PO TABS: 80.0000 mg | ORAL_TABLET | Freq: Every day | ORAL | Status: DC

## 2013-07-15 ENCOUNTER — Encounter: Payer: Self-pay | Admitting: Internal Medicine

## 2013-07-18 ENCOUNTER — Encounter: Payer: Self-pay | Admitting: Internal Medicine

## 2013-07-26 ENCOUNTER — Emergency Department (HOSPITAL_COMMUNITY)
Admission: EM | Admit: 2013-07-26 | Discharge: 2013-07-26 | Disposition: A | Discharge status: Home / Self Care | Payer: Medicare Other | Attending: Emergency Medicine | Admitting: Emergency Medicine

## 2013-07-26 ENCOUNTER — Encounter (HOSPITAL_COMMUNITY): Payer: Self-pay | Admitting: Emergency Medicine

## 2013-07-26 DIAGNOSIS — I10 Essential (primary) hypertension: Secondary | ICD-10-CM | POA: Diagnosis not present

## 2013-07-26 DIAGNOSIS — Z794 Long term (current) use of insulin: Secondary | ICD-10-CM | POA: Insufficient documentation

## 2013-07-26 DIAGNOSIS — Z95810 Presence of automatic (implantable) cardiac defibrillator: Secondary | ICD-10-CM | POA: Insufficient documentation

## 2013-07-26 DIAGNOSIS — Z951 Presence of aortocoronary bypass graft: Secondary | ICD-10-CM | POA: Insufficient documentation

## 2013-07-26 DIAGNOSIS — I509 Heart failure, unspecified: Secondary | ICD-10-CM | POA: Insufficient documentation

## 2013-07-26 DIAGNOSIS — Z79899 Other long term (current) drug therapy: Secondary | ICD-10-CM | POA: Insufficient documentation

## 2013-07-26 DIAGNOSIS — Z7982 Long term (current) use of aspirin: Secondary | ICD-10-CM | POA: Insufficient documentation

## 2013-07-26 DIAGNOSIS — Z9889 Other specified postprocedural states: Secondary | ICD-10-CM | POA: Diagnosis not present

## 2013-07-26 DIAGNOSIS — S61209A Unspecified open wound of unspecified finger without damage to nail, initial encounter: Secondary | ICD-10-CM | POA: Diagnosis not present

## 2013-07-26 DIAGNOSIS — E119 Type 2 diabetes mellitus without complications: Secondary | ICD-10-CM | POA: Diagnosis not present

## 2013-07-26 DIAGNOSIS — Z87891 Personal history of nicotine dependence: Secondary | ICD-10-CM | POA: Insufficient documentation

## 2013-07-26 DIAGNOSIS — Y929 Unspecified place or not applicable: Secondary | ICD-10-CM | POA: Insufficient documentation

## 2013-07-26 DIAGNOSIS — I2510 Atherosclerotic heart disease of native coronary artery without angina pectoris: Secondary | ICD-10-CM | POA: Diagnosis not present

## 2013-07-26 DIAGNOSIS — S61258A Open bite of other finger without damage to nail, initial encounter: Secondary | ICD-10-CM

## 2013-07-26 DIAGNOSIS — Y939 Activity, unspecified: Secondary | ICD-10-CM | POA: Insufficient documentation

## 2013-07-26 DIAGNOSIS — W540XXA Bitten by dog, initial encounter: Secondary | ICD-10-CM | POA: Insufficient documentation

## 2013-07-26 MED ORDER — AMOXICILLIN-POT CLAVULANATE 875-125 MG PO TABS: 1.0000 | ORAL_TABLET | Freq: Two times a day (BID) | ORAL | Status: DC

## 2013-07-26 NOTE — Discharge Instructions (Signed)
Keep finger clean. Apply bacitracin twice a day. Ibuprofen or tylenol for pain. Take agumentin as prescribed to prevent infection. Return if worsening.    Animal Bite An animal bite can result in a scratch on the skin, deep open cut, puncture of the skin, crush injury, or tearing away of the skin or a body part. Dogs are responsible for most animal bites. Children are bitten more often than adults. An animal bite can range from very mild to more serious. A small bite from your house pet is no cause for alarm. However, some animal bites can become infected or injure a bone or other tissue. You must seek medical care if:  The skin is broken and bleeding does not slow down or stop after 15 minutes.  The puncture is deep and difficult to clean (such as a cat bite).  Pain, warmth, redness, or pus develops around the wound.  The bite is from a stray animal or rodent. There may be a risk of rabies infection.  The bite is from a snake, raccoon, skunk, fox, coyote, or bat. There may be a risk of rabies infection.  The person bitten has a chronic illness such as diabetes, liver disease, or cancer, or the person takes medicine that lowers the immune system.  There is concern about the location and severity of the bite. It is important to clean and protect an animal bite wound right away to prevent infection. Follow these steps:  Clean the wound with plenty of water and soap.  Apply an antibiotic cream.  Apply gentle pressure over the wound with a clean towel or gauze to slow or stop bleeding.  Elevate the affected area above the heart to help stop any bleeding.  Seek medical care. Getting medical care within 8 hours of the animal bite leads to the best possible outcome. DIAGNOSIS  Your caregiver will most likely:  Take a detailed history of the animal and the bite injury.  Perform a wound exam.  Take your medical history. Blood tests or X-rays may be performed. Sometimes, infected bite  wounds are cultured and sent to a lab to identify the infectious bacteria.  TREATMENT  Medical treatment will depend on the location and type of animal bite as well as the patient's medical history. Treatment may include:  Wound care, such as cleaning and flushing the wound with saline solution, bandaging, and elevating the affected area.  Antibiotics.  Tetanus immunization.  Rabies immunization.  Leaving the wound open to heal. This is often done with animal bites, due to the high risk of infection. However, in certain cases, wound closure with stitches, wound adhesive, skin adhesive strips, or staples may be used. Infected bites that are left untreated may require intravenous (IV) antibiotics and surgical treatment in the hospital. [place]  Follow your caregiver's instructions for wound care.  Take all medicines as directed.  If your caregiver prescribes antibiotics, take them as directed. Finish them even if you start to feel better.  Follow up with your caregiver for further exams or immunizations as directed. You may need a tetanus shot if:  You cannot remember when you had your last tetanus shot.  You have never had a tetanus shot.  The injury broke your skin. If you get a tetanus shot, your arm may swell, get red, and feel warm to the touch. This is common and not a problem. If you need a tetanus shot and you choose not to have one, there is a rare  chance of getting tetanus. Sickness from tetanus can be serious. SEEK MEDICAL CARE IF:  You notice warmth, redness, soreness, swelling, pus discharge, or a bad smell coming from the wound.  You have a red line on the skin coming from the wound.  You have a fever, chills, or a general ill feeling.  You have nausea or vomiting.  You have continued or worsening pain.  You have trouble moving the injured part.  You have other questions or concerns. MAKE SURE YOU:  Understand these instructions.  Will  watch your condition.  Will get help right away if you are not doing well or get worse. Document Released: 11/05/2010 Document Revised: 05/12/2011 Document Reviewed: 11/05/2010 [facility] Patient Information 2014 [name].

## 2013-07-26 NOTE — ED Provider Notes (Signed)
Medical screening examination/treatment/procedure(s) were performed by non-physician practitioner and as supervising physician I was immediately available for consultation/collaboration.   EKG Interpretation None       Merryl Hacker, MD 07/26/13 [phone]

## 2013-07-26 NOTE — ED Notes (Signed)
Pt was bitten on her left index finger by own dog. Small puncture wound and small laceration to distal phalanx. Small amt of blood oozing. Dog's shots are UTD.

## 2013-07-26 NOTE — ED Provider Notes (Signed)
CSN: [id number]     Arrival date & time 07/26/13  1045 History  This chart was scribed for non-physician practitioner, Jeannett Senior, PA-C working with Merryl Hacker, MD by Frederich Balding, ED scribe. This patient was seen in room TR06C/TR06C and the patient's care was started at 12:02 PM.   Chief Complaint  Patient presents with  . Animal Bite   The history is provided by the patient. No language interpreter was used.   HPI Comments: [name] is a 58 y.o. female who presents to the Emergency Department complaining of a dog bite to her left index finger. States the dog's shots are up to date. Pt states her tetanus is up to date.   Past Medical History  Diagnosis Date  . Coronary artery disease   . Diabetes mellitus   . Hypertension   . CHF (congestive heart failure)     Systolic.  EF 35-00%  . Polysubstance abuse     history of  (cocaine, tobacco and ETOH)   Past Surgical History  Procedure Laterality Date  . Coronary artery bypass graft    . Cardiac catheterization    . Cardiac defibrillator placement      2011, Followed By Dr. Caryl Comes  . Tee without cardioversion N/A 06/02/2013    Procedure: TRANSESOPHAGEAL ECHOCARDIOGRAM (TEE);  Surgeon: Larey Dresser, MD;  Location: [facility] ENDOSCOPY;  Service: Cardiovascular;  Laterality: N/A;   Family History  Problem Relation Age of Onset  . Heart attack Mother     Died age 83  . Diabetes Maternal Grandmother   . Heart attack Cousin   . Heart attack Maternal Uncle    History  Substance Use Topics  . Smoking status: Former Smoker    Quit date: 03/03/2011  . Smokeless tobacco: Not on file  . Alcohol Use: Yes     Comment: Had some drinks New Years, describes social drinkingC   OB History   Grav Para Term Preterm Abortions TAB SAB Ect Mult Living                 Review of Systems  Skin: Positive for wound.  All other systems reviewed and are negative.  Allergies  Review of patient's allergies indicates no known  allergies.  Home Medications   Prior to Admission medications   Medication Sig Start Date End Date Taking? Authorizing Provider  allopurinol (ZYLOPRIM) 100 MG tablet Take 100-200 mg by mouth 2 (two) times daily. Takes 200 mg in the morning and 100 mg in the evening    Historical Provider, MD  aspirin 81 MG chewable tablet Chew 81 mg by mouth daily.    Ripudeep Krystal Eaton, MD  atorvastatin (LIPITOR) 80 MG tablet Take 1 tablet (80 mg total) by mouth daily. 07/11/13   Jolaine Artist, MD  carvedilol (COREG) 6.25 MG tablet Take 0.5 tablets (3.125 mg total) by mouth 2 (two) times daily with a meal. 06/02/13   Larey Dresser, MD  digoxin (LANOXIN) 0.125 MG tablet Take 0.5 tablets (0.0625 mg total) by mouth every other day. 04/25/13   Rande Brunt, NP  furosemide (LASIX) 40 MG tablet Take 80 mg (2 tabs) in AM and 40 mg (1 tab) in PM 05/31/13   Larey Dresser, MD  glucose blood (ACCU-CHEK INSTANT PLUS TEST) test strip Use to test blood sugars 4 times a day. Insulin dependent. Dx code; 250.02. 06/22/13   Charlann Lange, MD  insulin glargine (LANTUS) 100 UNIT/ML injection Inject 0.38 mLs (38  Units total) into the skin at bedtime. 04/14/13   Charlann Lange, MD  insulin lispro (HUMALOG) 100 UNIT/ML injection Inject 5-8 Units into the skin 3 (three) times daily before meals.    Historical Provider, MD  Insulin Pen Needle (B-D ULTRAFINE III SHORT PEN) 31G X 8 MM MISC 1 Device by Does not apply route as directed. 04/14/13   Charlann Lange, MD  lisinopril (PRINIVIL,ZESTRIL) 5 MG tablet Take 2.5 mg by mouth 2 (two) times daily.     Historical Provider, MD  nitroGLYCERIN (NITROSTAT) 0.4 MG SL tablet Place 1 tablet (0.4 mg total) under the tongue every 5 (five) minutes as needed. For chest pain. 06/02/12   Deboraha Sprang, MD  potassium chloride SA (K-DUR,KLOR-CON) 20 MEQ tablet Take 1 tablet (20 mEq total) by mouth daily. 06/29/13   Jolaine Artist, MD  spironolactone (ALDACTONE) 25 MG tablet Take 25 mg by mouth daily.    Historical Provider, MD    BP 104/60  Pulse 74  Temp(Src) 98.2 F (36.8 C) (Oral)  Resp 20  SpO2 100%  Physical Exam  Nursing note and vitals reviewed. Constitutional: She is oriented to person, place, and time. She appears well-developed and well-nourished. No distress.  HENT:  Head: Normocephalic and atraumatic.  Eyes: EOM are normal.  Neck: Neck supple. No tracheal deviation present.  Cardiovascular: Normal rate.   Pulmonary/Chest: Effort normal. No respiratory distress.  Musculoskeletal: Normal range of motion.  Neurological: She is alert and oriented to person, place, and time.  Skin: Skin is warm and dry.  1 cm well approximated laceration to the distal phalanx of the left index finger. Hemostatic. Full ROM of left index finger at all joints.  Psychiatric: She has a normal mood and affect. Her behavior is normal.    ED Course  Procedures (including critical care time)  DIAGNOSTIC STUDIES: Oxygen Saturation is 100% on RA, normal by my interpretation.    COORDINATION OF CARE: 12:04 PM-Discussed treatment plan which includes an antibiotic and ibuprofen or tylenol for pain with pt at bedside and pt agreed to plan.   Labs Review Labs Reviewed - No data to display  Imaging Review No results found.   EKG Interpretation None      MDM   Final diagnoses:  Dog bite of index finger    Small 1 cm superficial wound to the left index finger. Wound is from a dog bite. Patient states that her dog, all vaccinations are up to date, patient's tetanus up-to-date. Patient's finger was irrigated and soaked in saline. Plan to put a dressing, followup with primary care Dr. Augmentin for prophylaxis. Pt agrees with the plan.  Filed Vitals:   07/26/13 1058  BP: 104/60  Pulse: 74  Temp: 98.2 F (36.8 C)  TempSrc: Oral  Resp: 20  SpO2: 100%    I personally performed the services described in this documentation, which was scribed in my presence. The recorded information has been reviewed and is  accurate.  Renold Genta, PA-C 07/26/13 1751

## 2013-08-01 ENCOUNTER — Other Ambulatory Visit: Payer: Self-pay | Admitting: Internal Medicine

## 2013-08-01 NOTE — Telephone Encounter (Signed)
Message sent to front desk to schedule pt an appt. 

## 2013-08-01 NOTE — Telephone Encounter (Signed)
Please schedule a follow-up appointment within 1 month.

## 2013-08-10 ENCOUNTER — Encounter: Payer: Self-pay | Admitting: Internal Medicine

## 2013-08-24 ENCOUNTER — Telehealth: Payer: Self-pay | Admitting: Internal Medicine

## 2013-08-24 ENCOUNTER — Other Ambulatory Visit: Payer: Self-pay | Admitting: Internal Medicine

## 2013-08-24 DIAGNOSIS — E1165 Type 2 diabetes mellitus with hyperglycemia: Secondary | ICD-10-CM

## 2013-08-24 DIAGNOSIS — G453 Amaurosis fugax: Secondary | ICD-10-CM

## 2013-08-24 DIAGNOSIS — IMO0002 Reserved for concepts with insufficient information to code with codable children: Secondary | ICD-10-CM

## 2013-08-24 MED ORDER — ASPIRIN EC 325 MG PO TBEC: 325.0000 mg | DELAYED_RELEASE_TABLET | Freq: Every day | ORAL | Status: DC

## 2013-08-24 MED ORDER — INSULIN PEN NEEDLE 31G X 8 MM MISC: Status: DC

## 2013-08-24 NOTE — Telephone Encounter (Signed)
  INTERNAL MEDICINE RESIDENCY PROGRAM After-Hours Telephone Call    Reason for call:   I placed an outgoing call to Ms. [name] at 4:00 PM. Her ASA 81 will be stopped and ASA 325 mg po daily will be started for secondary prevention for Amaurosis Fugax. Patient is called and discussed above treatment plan change. She agrees with it and states that she will pick up this Rx.      Charlann Lange, MD   08/24/2013, 4:25 PM

## 2013-09-07 ENCOUNTER — Telehealth: Payer: Self-pay | Admitting: Cardiology

## 2013-09-07 ENCOUNTER — Other Ambulatory Visit: Payer: Self-pay | Admitting: Internal Medicine

## 2013-09-07 NOTE — Telephone Encounter (Signed)
Informed pt that due to updates we need her to send a manual transmission. Pt verbalized understanding.

## 2013-09-09 ENCOUNTER — Encounter: Payer: Self-pay | Admitting: Internal Medicine

## 2013-09-09 ENCOUNTER — Ambulatory Visit (HOSPITAL_COMMUNITY)
Admission: RE | Admit: 2013-09-09 | Discharge: 2013-09-09 | Disposition: A | Discharge status: Home / Self Care | Payer: Medicare Other | Source: Ambulatory Visit | Attending: Internal Medicine | Admitting: Internal Medicine

## 2013-09-09 ENCOUNTER — Ambulatory Visit (INDEPENDENT_AMBULATORY_CARE_PROVIDER_SITE_OTHER): Payer: Medicare Other | Admitting: Internal Medicine

## 2013-09-09 VITALS — BP 84/57 | HR 86 | Temp 97.2°F | Ht 64.0 in | Wt 186.3 lb

## 2013-09-09 DIAGNOSIS — IMO0001 Reserved for inherently not codable concepts without codable children: Secondary | ICD-10-CM

## 2013-09-09 DIAGNOSIS — Z0000 Encounter for general adult medical examination without abnormal findings: Secondary | ICD-10-CM

## 2013-09-09 DIAGNOSIS — R070 Pain in throat: Secondary | ICD-10-CM

## 2013-09-09 DIAGNOSIS — E119 Type 2 diabetes mellitus without complications: Secondary | ICD-10-CM

## 2013-09-09 DIAGNOSIS — M1610 Unilateral primary osteoarthritis, unspecified hip: Secondary | ICD-10-CM | POA: Diagnosis not present

## 2013-09-09 DIAGNOSIS — R0982 Postnasal drip: Secondary | ICD-10-CM | POA: Insufficient documentation

## 2013-09-09 DIAGNOSIS — Z1231 Encounter for screening mammogram for malignant neoplasm of breast: Secondary | ICD-10-CM

## 2013-09-09 DIAGNOSIS — M25559 Pain in unspecified hip: Secondary | ICD-10-CM

## 2013-09-09 DIAGNOSIS — I5022 Chronic systolic (congestive) heart failure: Secondary | ICD-10-CM

## 2013-09-09 DIAGNOSIS — IMO0002 Reserved for concepts with insufficient information to code with codable children: Secondary | ICD-10-CM

## 2013-09-09 DIAGNOSIS — M25552 Pain in left hip: Secondary | ICD-10-CM

## 2013-09-09 DIAGNOSIS — M169 Osteoarthritis of hip, unspecified: Secondary | ICD-10-CM | POA: Diagnosis not present

## 2013-09-09 DIAGNOSIS — E1165 Type 2 diabetes mellitus with hyperglycemia: Secondary | ICD-10-CM

## 2013-09-09 DIAGNOSIS — M25551 Pain in right hip: Secondary | ICD-10-CM | POA: Insufficient documentation

## 2013-09-09 LAB — COMPLETE METABOLIC PANEL WITH GFR
ALT: 15 U/L (ref 0–35)
AST: 18 U/L (ref 0–37)
Albumin: 4.4 g/dL (ref 3.5–5.2)
Alkaline Phosphatase: 68 U/L (ref 39–117)
BUN: 62 mg/dL — ABNORMAL HIGH (ref 6–23)
CALCIUM: 9.5 mg/dL (ref 8.4–10.5)
CHLORIDE: 103 meq/L (ref 96–112)
CO2: 22 meq/L (ref 19–32)
CREATININE: 1.91 mg/dL — AB (ref 0.50–1.10)
GFR, EST NON AFRICAN AMERICAN: 29 mL/min — AB
GFR, Est African American: 33 mL/min — ABNORMAL LOW
GLUCOSE: 115 mg/dL — AB (ref 70–99)
Potassium: 4.6 mEq/L (ref 3.5–5.3)
Sodium: 139 mEq/L (ref 135–145)
Total Bilirubin: 0.3 mg/dL (ref 0.2–1.2)
Total Protein: 7 g/dL (ref 6.0–8.3)

## 2013-09-09 LAB — POCT GLYCOSYLATED HEMOGLOBIN (HGB A1C): Hemoglobin A1C: 6.6

## 2013-09-09 LAB — GLUCOSE, CAPILLARY: Glucose-Capillary: 117 mg/dL — ABNORMAL HIGH (ref 70–99)

## 2013-09-09 NOTE — Assessment & Plan Note (Signed)
Non-specific and transient for last two weeks, lasting 60 seconds at random times.  Responds to cough drops.  No lymphadenopathy, fever, rhinorrhea, or erythema on physical exam.  Occurs most frequently when laying down to sleep making GERD a possibility.  Will continue to monitor. -Continue cough drops for relief. -Return to clinic in 1 month to monitor for resolution.

## 2013-09-09 NOTE — Progress Notes (Signed)
I saw and evaluated the patient.  I personally confirmed the key portions of the history and exam documented by Dr. Moding and I reviewed pertinent patient test results.  The assessment, diagnosis, and plan were formulated together and I agree with the documentation in the resident's note. 

## 2013-09-09 NOTE — Patient Instructions (Signed)
Thank you for coming to clinic today Ms. Pile.  General instructions: -Continue to monitor your throat discomfort and let us know if it gets worse. -I ordered an x-ray of your left hip.  If it does not show any arthritis, we will make a referral to sports medicine for them to examine your hip and possibly give you a steroid injection. -Continue to take acetaminophen for your hip pain.  Do NOT exceed 2000 mg per day and try to limit your dosing as possible. -Do NOT take ibuprofen due to your kidney failure. -Congratulations on your excellent control of your diabetes! Keep up the good work. -You are due for your yearly mammogram, try to get it done as soon as you can. -Ask your cardiologist if it would be okay for your to get your colonoscopy. -Please make a follow up appointment to return to clinic in 1 month to see how your hip pain is doing.  Thank you for bringing your medicines today. This helps [place] keep you safe from mistakes.

## 2013-09-09 NOTE — Assessment & Plan Note (Addendum)
Two months of hip pain related to strain from work.  Most-likely muscular or inflammation given pain with palpation.  Trochanteric bursitis seems most likely.  Will get a hip x-ray to rule out arthritis and pursue possible steroid injection by sports medicine if the imaging is negative. -Left hip x-ray. -Referral to sports medicine if imaging is negative. -Continue acetaminophen BID up to 2000 mg, do not exceed given cirrhosis and kidney function. -Avoid NSAIDs due to poor kidney function. -Will add Ultram 25 mg q12h if pain worsens.  Addendum 09/13/13: X-rays showed mild degenerative change in left hip.  This is unlikely to be causing the significant pain that the patient describes. -Referred to sports medicine for possible steroid injection.

## 2013-09-09 NOTE — Assessment & Plan Note (Signed)
Lab Results  Component Value Date   HGBA1C 6.6 09/09/2013   HGBA1C 7.3 04/14/2013   HGBA1C 7.4 12/30/2012     Assessment: Diabetes control: good control (HgbA1C at goal) Progress toward A1C goal:  at goal  Plan: Medications:  continue current medications, Lantus 38 units at bedtime, Humalog with meals. Home glucose monitoring: Frequency: 2 times a day Timing: before breakfast;at bedtime Instruction/counseling given: discussed foot care, discussed the need for weight loss and discussed diet Educational resources provided: brochure Self management tools provided: copy of home glucose meter download

## 2013-09-09 NOTE — Assessment & Plan Note (Signed)
Patient is due for a colonoscopy, but she says her cardiologist is not comfortable with her undergoing this procedure currently.  Due for mammogram -Patient will follow up with cardiologist about colonoscopy. -Mammography ordered.

## 2013-09-09 NOTE — Assessment & Plan Note (Addendum)
Patient is euvolemic today with no heart failure symptoms.  Her blood pressure is low at 84/57, but this is typical for her.  She is not having any symptoms of hypoperfusion, and she has an appointment with her cardiologist next week. -Follow up with cardiology next week.  Will defer medication management to them. -CMP today to check potassium on spironolactone, also kidney and liver function given history of stage 3 CKD and cirrhosis

## 2013-09-09 NOTE — Progress Notes (Signed)
   Subjective:    Patient ID: [name], female    DOB: 06/24/1955, 58 y.o.   MRN: [id number]  HPI Comments: She reports her left hip hurting for the last 2 months.  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  She has been putting a lot of pressure on her left side.  She has been taking acetaminophen 500 mg tabs x2 in the mornings to help with the pain and occasionally two at night, and it has been helping.  She has also had throat pain for the last two weeks when she goes to bed.  She has been sucking on a cough drop to help.  She says it feels like something is in it, making her cough.  However, she can't cough anything up.  It doesn't help to drink water.  The pain starts when she lays down flat, and it usually resoles by the morning.  Diabetes Pertinent negatives for hypoglycemia include no dizziness or headaches. Pertinent negatives for diabetes include no chest pain and no fatigue.      Review of Systems  Constitutional: Negative for fever, chills, activity change, fatigue and unexpected weight change.  HENT: Negative.   Eyes: Negative for pain and visual disturbance.  Respiratory: Positive for cough. Negative for choking, chest tightness, shortness of breath, wheezing and stridor.   Cardiovascular: Negative for chest pain, palpitations and leg swelling.  Gastrointestinal: Negative for nausea, vomiting, abdominal pain, diarrhea and constipation.  Endocrine: Negative.   Genitourinary: Negative for frequency, hematuria and difficulty urinating.  Musculoskeletal: Positive for joint swelling. Negative for arthralgias and back pain.  Skin: Negative.   Neurological: Negative for dizziness, light-headedness, numbness and headaches.  Psychiatric/Behavioral: Negative for agitation.       Objective:   Physical Exam  Constitutional: She is oriented to person, place, and time. She appears well-developed  and well-nourished. No distress.  HENT:  Head: Normocephalic and atraumatic.  Nose: Nose normal.  Mouth/Throat: Oropharynx is clear and moist. No oropharyngeal exudate.  Eyes: Conjunctivae and EOM are normal. Pupils are equal, round, and reactive to light. Right eye exhibits no discharge. Left eye exhibits no discharge. No scleral icterus.  Neck: Normal range of motion. Neck supple.  Cardiovascular: Normal rate and regular rhythm.   Murmur (3/6 systolic murmur.) heard. Pulmonary/Chest: Effort normal and breath sounds normal. No respiratory distress. She has no wheezes. She has no rales.  Abdominal: Soft. Bowel sounds are normal. She exhibits no distension and no mass. There is no tenderness. There is no rebound and no guarding.  Musculoskeletal: Normal range of motion. She exhibits tenderness (Left straight leg raise and external rotation of hip tender at lateral hip and buttox.).  Tender to palpation of left trochanter.  Lymphadenopathy:    She has no cervical adenopathy.  Neurological: She is alert and oriented to person, place, and time. No cranial nerve deficit. Coordination normal.  Skin: Skin is warm and dry. No rash noted. She is not diaphoretic. No erythema.  Psychiatric: She has a normal mood and affect.          Assessment & Plan:  Please see problem-based assessment and plan.

## 2013-09-11 NOTE — Progress Notes (Addendum)
Patient ID: [name], female   DOB: 1955/05/26, 58 y.o.   MRN: [id number]  PCP: Dr. Nicoletta Dress (Internal Medicine)  HPI: 58 year old female with history of HTN, DM2, past polysubstance abuse (ETOH, tobacco, cocaine), CAD s/p CABG x5 with MV annuloplasty (2007), ICM s/p ICD, chronic systolic HF, severe MR, CKD stage III and PAD.   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 At time of cath LAD, LCX and RCA occluded. EF 45%. Had abrupt stent occlusion the next day and had to go back to the lab. 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   LHC 03/17/11  Severe native CAD with all bypass grafts patent   Echos: 2011 EF 20-25% -> Medtronic ICD placed.  03/2011: Severe LV dysfunction EF 10-15%  11/17/11 EF 25-30% 05/19/2012  EF 15% 2/15 EF 15%, diffuse hypokinesis, restrictive diastolic function, s/p MV repair with moderate-severe MR.   CPX: 06/17/12 Peak VO2 14.8 (predicted peak VO2 68.5%), VE/VCO2 slope 43.4 OUES: 1.19, Peak RER 1.12 Vent threshold 11.3 (pred peak VO2 52.3%)  CPX (3/15): peak VO2 15.6 (predicted peak VO2 74.5%) VE/VO2 40.8, RER 1.2  RHC (3/15): Mean RA 7 PA 60/22, mean 38 Mean PCWP 23 with prominent v-waves CI 2.5 PVR 3.8  Follow up for Heart Failure: Doing well other than hip and buttock pain. Denies SOB, orthopnea, CP or edema. Weight at home stable 182 lbs. No dizziness. Walks her dog everyday a block with no issues. Reports needing to get back into the gym. Following a low salt diet and drinking less than 2L a day.  Labs:  08/12/12 - on lipitor 40 mg daily Cholesterol 180 Triglyceride 195 HDL 29 LDL 112 K 4.5, creatinine 1.4 11/16/12 K 4.0 Labs 1.5 Pro BNP 181 02/14/13: K+ 4.6, Cr 1.33, Dig level 1.8, TSH 2.97 3/15: K 4.2, creatinine 1.07, HCT 32.4 06/2013: Dig level 0.3, pro-BNP 899, Cholesterol 140, TG 106, HDL 36, LDL 82, Cr 1.5, K+ 4.9  FH: Mother deceased: CAD, DM2, HTN  Father deceased: stroke  SH: Works odds/end jobs for [employer]; disabled. Lives in [place] with 2 sons.   ROS: All systems negative except as listed in HPI, PMH and Problem List.  Past Medical History  Diagnosis Date  . Coronary artery disease   . Diabetes mellitus   . Hypertension   . CHF (congestive heart failure)     Systolic.  EF 81-85%  . Polysubstance abuse     history of  (cocaine, tobacco and ETOH)    Current Outpatient Prescriptions  Medication Sig Dispense Refill  . allopurinol (ZYLOPRIM) 100 MG tablet Take 100-200 mg by mouth 2 (two) times daily. Takes 200 mg in the morning and 100 mg in the evening      . aspirin EC 325 MG tablet Take 1 tablet (325 mg total) by mouth daily.  92 tablet  4  . atorvastatin (LIPITOR) 80 MG tablet Take 1 tablet (80 mg total) by mouth daily.  30 tablet  11  . carvedilol (COREG) 6.25 MG tablet Take 0.5 tablets (3.125 mg total) by mouth 2 (two) times daily with a meal.  60 tablet  3  . digoxin (LANOXIN) 0.125 MG tablet Take 0.5 tablets (0.0625 mg total) by mouth every other day.  8 tablet  3  . furosemide (LASIX) 40 MG tablet Take 80 mg (2 tabs) in AM and 40 mg (1 tab) in  PM  90 tablet  3  . glucose blood (ACCU-CHEK INSTANT PLUS TEST) test strip Use to test blood sugars 4 times a day. Insulin dependent. Dx code; 250.02.  120 each  12  . insulin lispro (HUMALOG) 100 UNIT/ML injection Inject 5-8 Units into the skin 3 (three) times daily before meals.      . Insulin Pen Needle (Fields-D ULTRAFINE III SHORT PEN) 31G X 8 MM MISC 1 each 4 times daily  130 each  11  . LANTUS SOLOSTAR 100 UNIT/ML Solostar Pen INJECT 38 UNITS INTO THE SKIN AT BEDTIME  15 mL  2  . lisinopril (PRINIVIL,ZESTRIL) 5 MG tablet Take 2.5 mg by mouth 2 (two) times daily.       . nitroGLYCERIN (NITROSTAT) 0.4 MG SL tablet Place 1 tablet (0.4 mg total) under the tongue every 5 (five) minutes as needed. For chest pain.  25 tablet  11  . potassium chloride SA (K-DUR,KLOR-CON) 20 MEQ  tablet Take 1 tablet (20 mEq total) by mouth daily.  30 tablet  9  . spironolactone (ALDACTONE) 25 MG tablet Take 25 mg by mouth daily.      . [DISCONTINUED] sitaGLIPtin (JANUVIA) 25 MG tablet Take 1 tablet (25 mg total) by mouth daily.  30 tablet  1   No current facility-administered medications for this encounter.    Filed Vitals:   09/12/13 0939  BP: 80/48  Pulse: 91  Resp: 16  Weight: 182 lb 4 oz (82.668 kg)  SpO2: 96%   PHYSICAL EXAM: General:  Well appearing. No resp difficulty HEENT: normal Neck: supple. JVP flat.  Carotids 2+ bilaterally; no bruits. No lymphadenopathy or thryomegaly appreciated. Cor: PMI normal. Regular rate & rhythm. 3/6 HSM apex.  Lungs: clear Abdomen: soft, nontender, mildly distended. No hepatosplenomegaly. No bruits or masses. Good bowel sounds. Extremities: no cyanosis, clubbing, rash, edema Neuro: alert & orientedx3, cranial nerves grossly intact. Moves all 4 extremities w/o difficulty. Affect pleasant.   ASSESSMENT & PLAN:  1. Chronic Systolic Heart Failure: Ischemic cardiomyopathy. EF 15% (2/15). She has a Medtronic ICD that does not have Optivol.  - NYHA II symptoms. I think she maybe a little dry with last Cr elevated 1.91. Will hold lasix for two days and then decrease to 80 mg q am and instructed to take an extra 40 mg in the afternoons if weight goes up more than 3lbs in a day or 5 lbs in a week. Continue 20 meq of KCL currently. Will repeat BMET 1 week. - SBP too soft to titrate medications. Continue coreg 3.125 mg BID, lisinopril 5 mg BID, digoxin 0.0625 mg QOD and spiro 25 mg daily. Last dig level 0.3 (06/2013) - Need to continue to follow patient closely and may need advanced therapies in the future. CPX stable and actually slightly improved from last year. - Reinforced the need and importance of daily weights, a low sodium diet, and fluid restriction (less than 2 L a day). Instructed to call the HF clinic if weight increases more than 3 lbs  overnight or 5 lbs in a week.  2. CAD: No chest pain.  Continue ASA, statin and BB.  3. Mitral regurgitation: Patient had mitral valve annuloplasty with CABG.  She has a significant MR murmur on exam and was noted to have prominent v-waves on recent RHC.  Moderate to severe MR on echo in 2/15. TEE (06/2013) mitral regurg appears severe, anatomy of repaired valve does not look suitable for MV clipping and would be high  risk for MV replacement. Continue to follow.  4. Hx drug abuse: Remains abstinent from drug use and ETOH for >2 years. Congratulated the patient.   5. CKD stage III - baseline Cr 1.5. As above last Cr elevated and will hold diuretics for 2 days and then decrease to 80 mg daily.    F/U 6 weeks with MD [name] 09/12/2013

## 2013-09-12 ENCOUNTER — Ambulatory Visit (HOSPITAL_COMMUNITY)
Admission: RE | Admit: 2013-09-12 | Discharge: 2013-09-12 | Disposition: A | Discharge status: Home / Self Care | Payer: Medicare Other | Source: Ambulatory Visit | Attending: Internal Medicine | Admitting: Internal Medicine

## 2013-09-12 ENCOUNTER — Encounter (HOSPITAL_COMMUNITY): Payer: Self-pay

## 2013-09-12 VITALS — BP 80/48 | HR 91 | Resp 16 | Wt 182.2 lb

## 2013-09-12 DIAGNOSIS — Z87891 Personal history of nicotine dependence: Secondary | ICD-10-CM | POA: Diagnosis not present

## 2013-09-12 DIAGNOSIS — I129 Hypertensive chronic kidney disease with stage 1 through stage 4 chronic kidney disease, or unspecified chronic kidney disease: Secondary | ICD-10-CM | POA: Diagnosis not present

## 2013-09-12 DIAGNOSIS — Z79899 Other long term (current) drug therapy: Secondary | ICD-10-CM | POA: Insufficient documentation

## 2013-09-12 DIAGNOSIS — I5022 Chronic systolic (congestive) heart failure: Secondary | ICD-10-CM | POA: Diagnosis not present

## 2013-09-12 DIAGNOSIS — F1410 Cocaine abuse, uncomplicated: Secondary | ICD-10-CM | POA: Insufficient documentation

## 2013-09-12 DIAGNOSIS — Z7901 Long term (current) use of anticoagulants: Secondary | ICD-10-CM | POA: Diagnosis not present

## 2013-09-12 DIAGNOSIS — I959 Hypotension, unspecified: Secondary | ICD-10-CM | POA: Diagnosis not present

## 2013-09-12 DIAGNOSIS — N183 Chronic kidney disease, stage 3 unspecified: Secondary | ICD-10-CM | POA: Diagnosis not present

## 2013-09-12 DIAGNOSIS — I509 Heart failure, unspecified: Secondary | ICD-10-CM | POA: Insufficient documentation

## 2013-09-12 DIAGNOSIS — I059 Rheumatic mitral valve disease, unspecified: Secondary | ICD-10-CM | POA: Diagnosis not present

## 2013-09-12 DIAGNOSIS — E119 Type 2 diabetes mellitus without complications: Secondary | ICD-10-CM | POA: Insufficient documentation

## 2013-09-12 DIAGNOSIS — I2589 Other forms of chronic ischemic heart disease: Secondary | ICD-10-CM | POA: Diagnosis not present

## 2013-09-12 DIAGNOSIS — Z7982 Long term (current) use of aspirin: Secondary | ICD-10-CM | POA: Insufficient documentation

## 2013-09-12 DIAGNOSIS — Z794 Long term (current) use of insulin: Secondary | ICD-10-CM | POA: Insufficient documentation

## 2013-09-12 DIAGNOSIS — I2510 Atherosclerotic heart disease of native coronary artery without angina pectoris: Secondary | ICD-10-CM | POA: Diagnosis not present

## 2013-09-12 DIAGNOSIS — I340 Nonrheumatic mitral (valve) insufficiency: Secondary | ICD-10-CM

## 2013-09-12 MED ORDER — FUROSEMIDE 40 MG PO TABS: ORAL_TABLET | ORAL | Status: DC

## 2013-09-12 NOTE — Patient Instructions (Signed)
Hold your lasix for 2 days, then start taking 80 mg (2 tablets) daily. If your weight is up more than 3 lbs in a day or 5 lbs in a week then take 40 mg (1 tablet) extra in the evening.  Come by next week for labs.  Follow up 6 weeks with MD  Do the following things EVERYDAY: 1) Weigh yourself in the morning before breakfast. Write it down and keep it in a log. 2) Take your medicines as prescribed 3) Eat low salt foods-Limit salt (sodium) to 2000 mg per day.  4) Stay as active as you can everyday 5) Limit all fluids for the day to less than 2 liters 6)

## 2013-09-13 NOTE — Addendum Note (Signed)
Addended by: Charlesetta Shanks on: 09/13/2013 08:46 AM   Modules accepted: Orders

## 2013-09-19 ENCOUNTER — Ambulatory Visit (HOSPITAL_COMMUNITY)
Admission: RE | Admit: 2013-09-19 | Discharge: 2013-09-19 | Disposition: A | Discharge status: Home / Self Care | Payer: Medicare Other | Source: Ambulatory Visit | Attending: Cardiology | Admitting: Cardiology

## 2013-09-19 DIAGNOSIS — N183 Chronic kidney disease, stage 3 unspecified: Secondary | ICD-10-CM | POA: Insufficient documentation

## 2013-09-19 DIAGNOSIS — I4891 Unspecified atrial fibrillation: Secondary | ICD-10-CM | POA: Insufficient documentation

## 2013-09-19 DIAGNOSIS — Z87891 Personal history of nicotine dependence: Secondary | ICD-10-CM | POA: Insufficient documentation

## 2013-09-19 DIAGNOSIS — E785 Hyperlipidemia, unspecified: Secondary | ICD-10-CM | POA: Insufficient documentation

## 2013-09-19 DIAGNOSIS — E1149 Type 2 diabetes mellitus with other diabetic neurological complication: Secondary | ICD-10-CM | POA: Diagnosis not present

## 2013-09-19 DIAGNOSIS — Z794 Long term (current) use of insulin: Secondary | ICD-10-CM | POA: Insufficient documentation

## 2013-09-19 DIAGNOSIS — F1011 Alcohol abuse, in remission: Secondary | ICD-10-CM | POA: Diagnosis not present

## 2013-09-19 DIAGNOSIS — I2589 Other forms of chronic ischemic heart disease: Secondary | ICD-10-CM | POA: Insufficient documentation

## 2013-09-19 DIAGNOSIS — I4729 Other ventricular tachycardia: Secondary | ICD-10-CM | POA: Diagnosis not present

## 2013-09-19 DIAGNOSIS — E1142 Type 2 diabetes mellitus with diabetic polyneuropathy: Secondary | ICD-10-CM | POA: Insufficient documentation

## 2013-09-19 DIAGNOSIS — I2510 Atherosclerotic heart disease of native coronary artery without angina pectoris: Secondary | ICD-10-CM | POA: Insufficient documentation

## 2013-09-19 DIAGNOSIS — I059 Rheumatic mitral valve disease, unspecified: Secondary | ICD-10-CM | POA: Insufficient documentation

## 2013-09-19 DIAGNOSIS — I509 Heart failure, unspecified: Secondary | ICD-10-CM | POA: Insufficient documentation

## 2013-09-19 DIAGNOSIS — Z7982 Long term (current) use of aspirin: Secondary | ICD-10-CM | POA: Insufficient documentation

## 2013-09-19 DIAGNOSIS — I472 Ventricular tachycardia, unspecified: Secondary | ICD-10-CM | POA: Insufficient documentation

## 2013-09-19 DIAGNOSIS — M109 Gout, unspecified: Secondary | ICD-10-CM | POA: Diagnosis not present

## 2013-09-19 DIAGNOSIS — Z833 Family history of diabetes mellitus: Secondary | ICD-10-CM | POA: Diagnosis not present

## 2013-09-19 DIAGNOSIS — I129 Hypertensive chronic kidney disease with stage 1 through stage 4 chronic kidney disease, or unspecified chronic kidney disease: Secondary | ICD-10-CM | POA: Insufficient documentation

## 2013-09-19 DIAGNOSIS — F1411 Cocaine abuse, in remission: Secondary | ICD-10-CM | POA: Insufficient documentation

## 2013-09-19 DIAGNOSIS — Z8249 Family history of ischemic heart disease and other diseases of the circulatory system: Secondary | ICD-10-CM | POA: Diagnosis not present

## 2013-09-19 DIAGNOSIS — Z95810 Presence of automatic (implantable) cardiac defibrillator: Secondary | ICD-10-CM | POA: Diagnosis not present

## 2013-09-19 DIAGNOSIS — I5022 Chronic systolic (congestive) heart failure: Secondary | ICD-10-CM

## 2013-09-19 LAB — BASIC METABOLIC PANEL
ANION GAP: 14 (ref 5–15)
BUN: 44 mg/dL — ABNORMAL HIGH (ref 6–23)
CHLORIDE: 103 meq/L (ref 96–112)
CO2: 24 mEq/L (ref 19–32)
CREATININE: 1.46 mg/dL — AB (ref 0.50–1.10)
Calcium: 9.5 mg/dL (ref 8.4–10.5)
GFR calc Af Amer: 45 mL/min — ABNORMAL LOW (ref >90)
GFR, EST NON AFRICAN AMERICAN: 39 mL/min — AB (ref >90)
Glucose, Bld: 100 mg/dL — ABNORMAL HIGH (ref 70–99)
POTASSIUM: 5 meq/L (ref 3.7–5.3)
Sodium: 141 mEq/L (ref 137–147)

## 2013-09-26 ENCOUNTER — Encounter: Payer: Self-pay | Admitting: *Deleted

## 2013-09-30 ENCOUNTER — Encounter: Payer: Self-pay | Admitting: Internal Medicine

## 2013-09-30 ENCOUNTER — Ambulatory Visit (INDEPENDENT_AMBULATORY_CARE_PROVIDER_SITE_OTHER): Payer: Medicare Other | Admitting: Internal Medicine

## 2013-09-30 VITALS — BP 100/61 | HR 67 | Ht 64.0 in | Wt 184.0 lb

## 2013-09-30 DIAGNOSIS — H3400 Transient retinal artery occlusion, unspecified eye: Secondary | ICD-10-CM | POA: Diagnosis not present

## 2013-09-30 DIAGNOSIS — I5022 Chronic systolic (congestive) heart failure: Secondary | ICD-10-CM

## 2013-09-30 DIAGNOSIS — I2589 Other forms of chronic ischemic heart disease: Secondary | ICD-10-CM

## 2013-09-30 DIAGNOSIS — I2510 Atherosclerotic heart disease of native coronary artery without angina pectoris: Secondary | ICD-10-CM

## 2013-09-30 DIAGNOSIS — I255 Ischemic cardiomyopathy: Secondary | ICD-10-CM

## 2013-09-30 DIAGNOSIS — G453 Amaurosis fugax: Secondary | ICD-10-CM

## 2013-09-30 DIAGNOSIS — Z95810 Presence of automatic (implantable) cardiac defibrillator: Secondary | ICD-10-CM | POA: Diagnosis not present

## 2013-09-30 LAB — MDC_IDC_ENUM_SESS_TYPE_INCLINIC
Date Time Interrogation Session: 20150731142418
HIGH POWER IMPEDANCE MEASURED VALUE: 190 Ohm
HighPow Impedance: 40 Ohm
HighPow Impedance: 456 Ohm
HighPow Impedance: 51 Ohm
Lead Channel Impedance Value: 646 Ohm
Lead Channel Pacing Threshold Amplitude: 0.75 V
Lead Channel Pacing Threshold Pulse Width: 0.4 ms
Lead Channel Sensing Intrinsic Amplitude: 18.25 mV
Lead Channel Setting Pacing Amplitude: 2.5 V
Lead Channel Setting Pacing Pulse Width: 0.4 ms
Lead Channel Setting Sensing Sensitivity: 0.45 mV
MDC IDC MSMT BATTERY VOLTAGE: 3.08 V
MDC IDC MSMT LEADCHNL RV SENSING INTR AMPL: 14.125 mV
MDC IDC SET ZONE DETECTION INTERVAL: 290 ms
MDC IDC SET ZONE DETECTION INTERVAL: 360 ms
MDC IDC STAT BRADY RV PERCENT PACED: 0.01 %
Zone Setting Detection Interval: 370 ms

## 2013-09-30 MED ORDER — ASPIRIN EC 81 MG PO TBEC: 81.0000 mg | DELAYED_RELEASE_TABLET | Freq: Every day | ORAL | Status: DC

## 2013-09-30 NOTE — Progress Notes (Signed)
Patient Care Team: Arman Filter, MD as PCP - General Deboraha Sprang, MD (Cardiology) Jolaine Artist, MD (Cardiology)   HPI  [name] is a 58 y.o. 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  Catheterization 2013-January demonstrated patent grafts cardiac index 2.2 ejection fraction 10-15%  Electrocardiogram April 2013 demonstrated a QRS duration of 124 ms repeat today is 126.  She was seen a couple of months ago for consideration of CRT upgrade for her congestive heart failure class III. There was some discrepancy as to her functional status and she was submitted for cardiopulmonary stress testing which demonstrated a low VO2 max-14.9 with the recommendation that we proceed with CRT upgrade.the ECG however was most consistent with IVCD it was elected not to pursue device upgrade   Her aspirin dose has been increased to-325 mg     Past Medical History  Diagnosis Date  . Coronary artery disease   . Diabetes mellitus   . Hypertension   . CHF (congestive heart failure)     Systolic.  EF 66-44%  . Polysubstance abuse     history of  (cocaine, tobacco and ETOH)    Past Surgical History  Procedure Laterality Date  . Coronary artery bypass graft    . Cardiac catheterization    . Cardiac defibrillator placement      2011, Followed By Dr. Caryl Comes  . Tee without cardioversion N/A 06/02/2013    Procedure: TRANSESOPHAGEAL ECHOCARDIOGRAM (TEE);  Surgeon: Larey Dresser, MD;  Location: [facility] ENDOSCOPY;  Service: Cardiovascular;  Laterality: N/A;    Current Outpatient Prescriptions  Medication Sig Dispense Refill  . allopurinol (ZYLOPRIM) 100 MG tablet Take 100-200 mg by mouth 2 (two) times daily. Takes 200 mg in the morning and 100 mg in the evening      . aspirin EC 325 MG tablet Take 1 tablet (325 mg total) by mouth daily.  92 tablet  4  . atorvastatin  (LIPITOR) 80 MG tablet Take 1 tablet (80 mg total) by mouth daily.  30 tablet  11  . carvedilol (COREG) 6.25 MG tablet Take 0.5 tablets (3.125 mg total) by mouth 2 (two) times daily with a meal.  60 tablet  3  . digoxin (LANOXIN) 0.125 MG tablet Take 0.5 tablets (0.0625 mg total) by mouth every other day.  8 tablet  3  . furosemide (LASIX) 40 MG tablet Take 80 mg (2 tabs) daily. If your weight is up more than 3 lbs in a day or 5 lbs week take extra 40 mg (1 tablet) at night.  90 tablet  3  . glucose blood (ACCU-CHEK INSTANT PLUS TEST) test strip Use to test blood sugars 4 times a day. Insulin dependent. Dx code; 250.02.  120 each  12  . insulin lispro (HUMALOG) 100 UNIT/ML injection Inject 5-8 Units into the skin 3 (three) times daily before meals.      . Insulin Pen Needle (B-D ULTRAFINE III SHORT PEN) 31G X 8 MM MISC 1 each 4 times daily  130 each  11  . LANTUS SOLOSTAR 100 UNIT/ML Solostar Pen INJECT 38 UNITS INTO THE SKIN AT BEDTIME  15 mL  2  . lisinopril (PRINIVIL,ZESTRIL) 5 MG tablet Take 2.5 mg by mouth 2 (two) times daily.       . nitroGLYCERIN (NITROSTAT) 0.4 MG SL tablet Place 1 tablet (0.4 mg total)  under the tongue every 5 (five) minutes as needed. For chest pain.  25 tablet  11  . potassium chloride SA (K-DUR,KLOR-CON) 20 MEQ tablet Take 1 tablet (20 mEq total) by mouth daily.  30 tablet  9  . spironolactone (ALDACTONE) 25 MG tablet Take 25 mg by mouth daily.      . [DISCONTINUED] sitaGLIPtin (JANUVIA) 25 MG tablet Take 1 tablet (25 mg total) by mouth daily.  30 tablet  1   No current facility-administered medications for this visit.    No Known Allergies  Review of Systems negative except from HPI and PMH  Physical Exam BP 100/61  Pulse 67  Ht 5\' 4"  (1.626 m)  Wt 184 lb (83.462 kg)  BMI 31.57 kg/m2 Well developed and well nourished in no acute distress HENT normal E scleral and icterus clear Neck Supple JVP flat; carotids brisk and full Clear to ausculation  egular  rate and rhythm, no murmurs gallops or rub Soft with active bowel sounds No clubbing cyanosis no Edema Alert and oriented, grossly normal motor and sensory function Skin Warm and Dry  ECG today demonstrates sinus rhythm at 64 intervals 16/12/42 Nonspecific IVCD  Assessment and  Plan  Nonischemic/valvular cardiomyopathy  Coronary artery disease  IVCD   Implantable defibrillator-[employer] The patient's device was interrogated.  The information was reviewed. No changes were made in the programming.    Congestive heart failure-chronic-systolic  The patient is euvolemic. Her blood pressure remains low precluding dose up titration. Last digoxin level was 0.3. This could potentially be increased. Will defer to heart failure clinic. QRS duration remains 125 ms and with a nonspecific although close left bundle branch block pattern. I think proceeding with her modest symptoms with CRT upgrade at this point it as not clearly the right thing to do and will hold off

## 2013-09-30 NOTE — Patient Instructions (Addendum)
Your physician has recommended you make the following change in your medication:  1) DECREASE Aspirin to 81 mg daily.  Remote monitoring is used to monitor your ICD from home. This monitoring reduces the number of office visits required to check your device to one time per year. It allows [place] to keep an eye on the functioning of your device to ensure it is working properly. You are scheduled for a device check from home on 01-02-2014. You may send your transmission at any time that day. If you have a wireless device, the transmission will be sent automatically. After your physician reviews your transmission, you will receive a postcard with your next transmission date.  Your physician recommends that you schedule a follow-up appointment in: 12 months with Dr.Klein

## 2013-10-02 ENCOUNTER — Encounter (HOSPITAL_COMMUNITY): Payer: Self-pay | Admitting: Emergency Medicine

## 2013-10-02 ENCOUNTER — Inpatient Hospital Stay (HOSPITAL_COMMUNITY)
Admission: EM | Admit: 2013-10-02 | Discharge: 2013-10-07 | DRG: 287 | Disposition: A | Discharge status: Home / Self Care | Payer: Medicare Other | Attending: Internal Medicine | Admitting: Internal Medicine

## 2013-10-02 ENCOUNTER — Observation Stay (HOSPITAL_COMMUNITY): Payer: Medicare Other

## 2013-10-02 ENCOUNTER — Other Ambulatory Visit: Payer: Self-pay

## 2013-10-02 DIAGNOSIS — R51 Headache: Secondary | ICD-10-CM | POA: Diagnosis not present

## 2013-10-02 DIAGNOSIS — Z23 Encounter for immunization: Secondary | ICD-10-CM

## 2013-10-02 DIAGNOSIS — Z87891 Personal history of nicotine dependence: Secondary | ICD-10-CM

## 2013-10-02 DIAGNOSIS — I252 Old myocardial infarction: Secondary | ICD-10-CM

## 2013-10-02 DIAGNOSIS — I429 Cardiomyopathy, unspecified: Secondary | ICD-10-CM | POA: Diagnosis present

## 2013-10-02 DIAGNOSIS — R579 Shock, unspecified: Secondary | ICD-10-CM | POA: Diagnosis not present

## 2013-10-02 DIAGNOSIS — S0993XA Unspecified injury of face, initial encounter: Secondary | ICD-10-CM | POA: Diagnosis not present

## 2013-10-02 DIAGNOSIS — Z9861 Coronary angioplasty status: Secondary | ICD-10-CM

## 2013-10-02 DIAGNOSIS — Z794 Long term (current) use of insulin: Secondary | ICD-10-CM

## 2013-10-02 DIAGNOSIS — I2582 Chronic total occlusion of coronary artery: Secondary | ICD-10-CM | POA: Diagnosis present

## 2013-10-02 DIAGNOSIS — F1410 Cocaine abuse, uncomplicated: Secondary | ICD-10-CM | POA: Diagnosis present

## 2013-10-02 DIAGNOSIS — R55 Syncope and collapse: Secondary | ICD-10-CM | POA: Diagnosis not present

## 2013-10-02 DIAGNOSIS — I5022 Chronic systolic (congestive) heart failure: Secondary | ICD-10-CM | POA: Diagnosis present

## 2013-10-02 DIAGNOSIS — I509 Heart failure, unspecified: Secondary | ICD-10-CM | POA: Diagnosis present

## 2013-10-02 DIAGNOSIS — S0990XA Unspecified injury of head, initial encounter: Secondary | ICD-10-CM | POA: Diagnosis not present

## 2013-10-02 DIAGNOSIS — Z95810 Presence of automatic (implantable) cardiac defibrillator: Secondary | ICD-10-CM

## 2013-10-02 DIAGNOSIS — Z833 Family history of diabetes mellitus: Secondary | ICD-10-CM

## 2013-10-02 DIAGNOSIS — E119 Type 2 diabetes mellitus without complications: Secondary | ICD-10-CM | POA: Diagnosis present

## 2013-10-02 DIAGNOSIS — I129 Hypertensive chronic kidney disease with stage 1 through stage 4 chronic kidney disease, or unspecified chronic kidney disease: Secondary | ICD-10-CM | POA: Diagnosis present

## 2013-10-02 DIAGNOSIS — I059 Rheumatic mitral valve disease, unspecified: Secondary | ICD-10-CM | POA: Diagnosis present

## 2013-10-02 DIAGNOSIS — N189 Chronic kidney disease, unspecified: Secondary | ICD-10-CM | POA: Diagnosis not present

## 2013-10-02 DIAGNOSIS — Z7982 Long term (current) use of aspirin: Secondary | ICD-10-CM

## 2013-10-02 DIAGNOSIS — I472 Ventricular tachycardia, unspecified: Secondary | ICD-10-CM | POA: Diagnosis present

## 2013-10-02 DIAGNOSIS — Z45018 Encounter for adjustment and management of other part of cardiac pacemaker: Secondary | ICD-10-CM

## 2013-10-02 DIAGNOSIS — S199XXA Unspecified injury of neck, initial encounter: Secondary | ICD-10-CM | POA: Diagnosis not present

## 2013-10-02 DIAGNOSIS — N183 Chronic kidney disease, stage 3 unspecified: Secondary | ICD-10-CM | POA: Diagnosis present

## 2013-10-02 DIAGNOSIS — Z8249 Family history of ischemic heart disease and other diseases of the circulatory system: Secondary | ICD-10-CM

## 2013-10-02 DIAGNOSIS — I2510 Atherosclerotic heart disease of native coronary artery without angina pectoris: Secondary | ICD-10-CM | POA: Diagnosis present

## 2013-10-02 DIAGNOSIS — I4901 Ventricular fibrillation: Principal | ICD-10-CM | POA: Diagnosis present

## 2013-10-02 DIAGNOSIS — I2589 Other forms of chronic ischemic heart disease: Secondary | ICD-10-CM | POA: Diagnosis present

## 2013-10-02 DIAGNOSIS — Z951 Presence of aortocoronary bypass graft: Secondary | ICD-10-CM

## 2013-10-02 DIAGNOSIS — I428 Other cardiomyopathies: Secondary | ICD-10-CM | POA: Diagnosis not present

## 2013-10-02 DIAGNOSIS — I4729 Other ventricular tachycardia: Secondary | ICD-10-CM | POA: Diagnosis present

## 2013-10-02 DIAGNOSIS — I498 Other specified cardiac arrhythmias: Secondary | ICD-10-CM | POA: Diagnosis not present

## 2013-10-02 DIAGNOSIS — I10 Essential (primary) hypertension: Secondary | ICD-10-CM | POA: Diagnosis not present

## 2013-10-02 HISTORY — DX: Chronic systolic (congestive) heart failure: I50.22

## 2013-10-02 HISTORY — DX: Ventricular tachycardia, unspecified: I47.20

## 2013-10-02 HISTORY — DX: Ischemic cardiomyopathy: I25.5

## 2013-10-02 HISTORY — DX: Ventricular tachycardia: I47.2

## 2013-10-02 LAB — BASIC METABOLIC PANEL
ANION GAP: 16 — AB (ref 5–15)
BUN: 52 mg/dL — AB (ref 6–23)
CHLORIDE: 99 meq/L (ref 96–112)
CO2: 23 mEq/L (ref 19–32)
Calcium: 9.6 mg/dL (ref 8.4–10.5)
Creatinine, Ser: 1.8 mg/dL — ABNORMAL HIGH (ref 0.50–1.10)
GFR, EST AFRICAN AMERICAN: 35 mL/min — AB (ref >90)
GFR, EST NON AFRICAN AMERICAN: 30 mL/min — AB (ref >90)
Glucose, Bld: 112 mg/dL — ABNORMAL HIGH (ref 70–99)
POTASSIUM: 4.6 meq/L (ref 3.7–5.3)
Sodium: 138 mEq/L (ref 137–147)

## 2013-10-02 LAB — CBC
HEMATOCRIT: 35 % — AB (ref 36.0–46.0)
Hemoglobin: 11.4 g/dL — ABNORMAL LOW (ref 12.0–15.0)
MCH: 29.6 pg (ref 26.0–34.0)
MCHC: 32.6 g/dL (ref 30.0–36.0)
MCV: 90.9 fL (ref 78.0–100.0)
PLATELETS: 153 10*3/uL (ref 150–400)
RBC: 3.85 MIL/uL — ABNORMAL LOW (ref 3.87–5.11)
RDW: 17.1 % — ABNORMAL HIGH (ref 11.5–15.5)
WBC: 7 10*3/uL (ref 4.0–10.5)

## 2013-10-02 LAB — MAGNESIUM: Magnesium: 2.2 mg/dL (ref 1.5–2.5)

## 2013-10-02 LAB — I-STAT TROPONIN, ED: Troponin i, poc: 0.04 ng/mL (ref 0.00–0.08)

## 2013-10-02 LAB — CBG MONITORING, ED: GLUCOSE-CAPILLARY: 101 mg/dL — AB (ref 70–99)

## 2013-10-02 MED ORDER — DIGOXIN 0.0625 MG HALF TABLET
0.0625 mg | ORAL_TABLET | ORAL | Status: DC
  Administered 2013-10-03 – 2013-10-07 (×3): 0.0625 mg via ORAL
  Filled 2013-10-02 (×4): qty 1

## 2013-10-02 MED ORDER — INSULIN GLARGINE 100 UNIT/ML ~~LOC~~ SOLN: 38.0000 [IU] | Freq: Every day | SUBCUTANEOUS | Status: DC

## 2013-10-02 MED ORDER — INSULIN ASPART 100 UNIT/ML ~~LOC~~ SOLN: 0.0000 [IU] | Freq: Every day | SUBCUTANEOUS | Status: DC

## 2013-10-02 MED ORDER — INSULIN ASPART 100 UNIT/ML ~~LOC~~ SOLN
0.0000 [IU] | Freq: Three times a day (TID) | SUBCUTANEOUS | Status: DC
Start: 2013-10-03 — End: 2013-10-07
  Administered 2013-10-03: 1 [IU] via SUBCUTANEOUS
  Administered 2013-10-03: 2 [IU] via SUBCUTANEOUS
  Administered 2013-10-04 – 2013-10-06 (×5): 1 [IU] via SUBCUTANEOUS

## 2013-10-02 MED ORDER — ATORVASTATIN CALCIUM 80 MG PO TABS
80.0000 mg | ORAL_TABLET | Freq: Every day | ORAL | Status: DC
  Administered 2013-10-03 – 2013-10-07 (×4): 80 mg via ORAL
  Filled 2013-10-02 (×6): qty 1

## 2013-10-02 MED ORDER — INSULIN ASPART 100 UNIT/ML ~~LOC~~ SOLN
5.0000 [IU] | Freq: Three times a day (TID) | SUBCUTANEOUS | Status: DC
  Administered 2013-10-03 – 2013-10-07 (×10): 5 [IU] via SUBCUTANEOUS

## 2013-10-02 MED ORDER — SPIRONOLACTONE 25 MG PO TABS
25.0000 mg | ORAL_TABLET | Freq: Every day | ORAL | Status: DC
  Administered 2013-10-03: 25 mg via ORAL
  Filled 2013-10-02 (×3): qty 1

## 2013-10-02 MED ORDER — INSULIN GLARGINE 100 UNIT/ML SOLOSTAR PEN: 38.0000 [IU] | PEN_INJECTOR | Freq: Every day | SUBCUTANEOUS | Status: DC

## 2013-10-02 MED ORDER — ASPIRIN EC 81 MG PO TBEC
81.0000 mg | DELAYED_RELEASE_TABLET | Freq: Every day | ORAL | Status: DC
  Filled 2013-10-02: qty 1

## 2013-10-02 MED ORDER — INSULIN GLARGINE 100 UNIT/ML ~~LOC~~ SOLN
38.0000 [IU] | Freq: Every day | SUBCUTANEOUS | Status: DC
  Administered 2013-10-03 – 2013-10-06 (×4): 38 [IU] via SUBCUTANEOUS
  Filled 2013-10-02 (×6): qty 0.38

## 2013-10-02 MED ORDER — ALLOPURINOL 300 MG PO TABS
300.0000 mg | ORAL_TABLET | Freq: Every day | ORAL | Status: DC
  Administered 2013-10-03 – 2013-10-06 (×4): 300 mg via ORAL
  Filled 2013-10-02 (×2): qty 1
  Filled 2013-10-02 (×3): qty 3
  Filled 2013-10-02: qty 1

## 2013-10-02 MED ORDER — PNEUMOCOCCAL VAC POLYVALENT 25 MCG/0.5ML IJ INJ
0.5000 mL | INJECTION | INTRAMUSCULAR | Status: DC
  Filled 2013-10-02: qty 0.5

## 2013-10-02 MED ORDER — FUROSEMIDE 80 MG PO TABS
80.0000 mg | ORAL_TABLET | Freq: Every day | ORAL | Status: DC
  Administered 2013-10-03 – 2013-10-04 (×2): 80 mg via ORAL
  Filled 2013-10-02 (×3): qty 1

## 2013-10-02 MED ORDER — HEPARIN SODIUM (PORCINE) 5000 UNIT/ML IJ SOLN
5000.0000 [IU] | Freq: Three times a day (TID) | INTRAMUSCULAR | Status: DC
  Administered 2013-10-03 – 2013-10-07 (×13): 5000 [IU] via SUBCUTANEOUS
  Filled 2013-10-02 (×16): qty 1

## 2013-10-02 MED ORDER — LISINOPRIL 2.5 MG PO TABS
2.5000 mg | ORAL_TABLET | Freq: Two times a day (BID) | ORAL | Status: DC
  Administered 2013-10-03 – 2013-10-04 (×2): 2.5 mg via ORAL
  Filled 2013-10-02 (×7): qty 1

## 2013-10-02 MED ORDER — SODIUM CHLORIDE 0.9 % IV SOLN
Freq: Once | INTRAVENOUS | Status: AC
  Administered 2013-10-02: 20:00:00 via INTRAVENOUS

## 2013-10-02 MED ORDER — POTASSIUM CHLORIDE CRYS ER 20 MEQ PO TBCR
20.0000 meq | EXTENDED_RELEASE_TABLET | Freq: Every day | ORAL | Status: DC
  Administered 2013-10-03 – 2013-10-04 (×2): 20 meq via ORAL
  Filled 2013-10-02 (×3): qty 1

## 2013-10-02 MED ORDER — CARVEDILOL 3.125 MG PO TABS
3.1250 mg | ORAL_TABLET | Freq: Two times a day (BID) | ORAL | Status: DC
  Administered 2013-10-03 – 2013-10-04 (×3): 3.125 mg via ORAL
  Filled 2013-10-02 (×7): qty 1

## 2013-10-02 MED ORDER — ASPIRIN EC 81 MG PO TBEC
81.0000 mg | DELAYED_RELEASE_TABLET | Freq: Every day | ORAL | Status: DC
  Administered 2013-10-03: 81 mg via ORAL
  Filled 2013-10-02: qty 1

## 2013-10-02 NOTE — ED Notes (Signed)
Pt returned from ct

## 2013-10-02 NOTE — ED Notes (Signed)
phelbotomy called and informed still need blood draw was unable to obtain

## 2013-10-02 NOTE — ED Notes (Signed)
Pt to ct 

## 2013-10-02 NOTE — ED Notes (Signed)
Noted frequent runs of 5 beat vtach pt without c/o resting on cart cardiologist informed and in to see pt

## 2013-10-02 NOTE — ED Notes (Signed)
Run of 6 beat v tach pt was talking on phone denies pain or dizziness

## 2013-10-02 NOTE — ED Notes (Signed)
accucheck 101

## 2013-10-02 NOTE — ED Notes (Signed)
Pt c/o dizziness x 2 days. Pt reports that she "passed out." Pt reports hitting her head. Pt AAOx3. Speech clear, equal grips.

## 2013-10-02 NOTE — ED Provider Notes (Signed)
CSN: [id number]     Arrival date & time 10/02/13  1752 History   First MD Initiated Contact with Patient 10/02/13 1911     Chief Complaint  Patient presents with  . Loss of Consciousness  . Head Injury  . Dizziness      HPI  Patient presents with a syncopal episode. 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 Has a tachy device placed by Dr. Caryl Comes. Has never had a liver shock. History of intermittent congestive heart failure /dyspnea. Was previously on twice a day Lasix. This was decreased because of renal insufficiency over a month ago. She states she was given a when necessary order by her physician to resume her evening dose which she did 5 days ago. She took her evening dose Saturday. She laid in bed to watch TV. She was up to bathroom 4-5 times. She states this is not unusual for her. On her most recent episode she stood, felt dizzy. She then remembers waking up on the floor. Her right eye and face hurt. She does not remember a shock. Does not have chest pain. Asymptomatic now.  Past Medical History  Diagnosis Date  . Coronary artery disease   . Diabetes mellitus   . Hypertension   . CHF (congestive heart failure)     Systolic.  EF 30-86%  . Polysubstance abuse     history of  (cocaine, tobacco and ETOH)   Past Surgical History  Procedure Laterality Date  . Coronary artery bypass graft    . Cardiac catheterization    . Cardiac defibrillator placement      2011, Followed By Dr. Caryl Comes  . Tee without cardioversion N/A 06/02/2013    Procedure: TRANSESOPHAGEAL ECHOCARDIOGRAM (TEE);  Surgeon: Larey Dresser, MD;  Location: [facility] ENDOSCOPY;  Service: Cardiovascular;  Laterality: N/A;   Family History  Problem Relation Age of Onset  . Heart attack Mother     Died age 19  . Diabetes Maternal Grandmother   . Heart attack Cousin   . Heart attack Maternal Uncle    History  Substance Use Topics  . Smoking  status: Former Smoker    Quit date: 03/03/2011  . Smokeless tobacco: Not on file  . Alcohol Use: Yes     Comment: Had some drinks New Years, describes social drinkingC   OB History   Grav Para Term Preterm Abortions TAB SAB Ect Mult Living                 Review of Systems  Constitutional: Negative for fever, chills, diaphoresis, appetite change and fatigue.  HENT: Negative for mouth sores, sore throat and trouble swallowing.        Pain and swelling of her to right eye. No vision changes. No numbness of the face. No malocclusion. No dental trauma. Minimal headache.  Eyes: Negative for visual disturbance.  Respiratory: Negative for cough, chest tightness, shortness of breath and wheezing.   Cardiovascular: Negative for chest pain.  Gastrointestinal: Negative for nausea, vomiting, abdominal pain, diarrhea and abdominal distention.  Endocrine: Negative for polydipsia, polyphagia and polyuria.  Genitourinary: Negative for dysuria, frequency and hematuria.  Musculoskeletal: Negative for gait problem.  Skin: Negative for color change, pallor and rash.  Neurological: Positive for syncope. Negative for dizziness, light-headedness and headaches.  Hematological: Does not bruise/bleed easily.  Psychiatric/Behavioral: Negative for behavioral problems and confusion.      Allergies  Review of patient's allergies indicates no known  allergies.  Home Medications   Prior to Admission medications   Medication Sig Start Date End Date Taking? Authorizing Provider  allopurinol (ZYLOPRIM) 100 MG tablet Take 100-200 mg by mouth 2 (two) times daily. Takes 200 mg in the morning and 100 mg in the evening   Yes Historical Provider, MD  aspirin EC 81 MG tablet Take 81 mg by mouth daily. 09/30/13  Yes Deboraha Sprang, MD  atorvastatin (LIPITOR) 80 MG tablet Take 1 tablet (80 mg total) by mouth daily. 07/11/13  Yes Jolaine Artist, MD  carvedilol (COREG) 6.25 MG tablet Take 0.5 tablets (3.125 mg total) by  mouth 2 (two) times daily with a meal. 06/02/13  Yes Larey Dresser, MD  digoxin (LANOXIN) 0.125 MG tablet Take 0.5 tablets (0.0625 mg total) by mouth every other day. 04/25/13  Yes Rande Brunt, NP  furosemide (LASIX) 40 MG tablet Take 20 mg by mouth See admin instructions. Take two tablet in the morning, then one tablet in the evening 09/12/13  Yes Rande Brunt, NP  insulin glargine (LANTUS) 100 UNIT/ML injection Inject 38 Units into the skin at bedtime.   Yes Historical Provider, MD  insulin lispro (HUMALOG) 100 UNIT/ML injection Inject 5-8 Units into the skin 3 (three) times daily before meals.   Yes Historical Provider, MD  lisinopril (PRINIVIL,ZESTRIL) 5 MG tablet Take 2.5 mg by mouth 2 (two) times daily.    Yes Historical Provider, MD  nitroGLYCERIN (NITROSTAT) 0.4 MG SL tablet Place 1 tablet (0.4 mg total) under the tongue every 5 (five) minutes as needed. For chest pain. 06/02/12  Yes Deboraha Sprang, MD  potassium chloride SA (K-DUR,KLOR-CON) 20 MEQ tablet Take 1 tablet (20 mEq total) by mouth daily. 06/29/13  Yes Jolaine Artist, MD  spironolactone (ALDACTONE) 25 MG tablet Take 25 mg by mouth daily.   Yes Historical Provider, MD   BP 108/68  Pulse 71  Temp(Src) 97.9 F (36.6 C) (Oral)  Resp 20  Ht 5\' 4"  (1.626 m)  Wt 182 lb (82.555 kg)  BMI 31.22 kg/m2  SpO2 100% Physical Exam  Constitutional: She is oriented to person, place, and time. She appears well-developed and well-nourished. No distress.  HENT:  Head: Normocephalic.    Normal V1 through V3 sensation.  Eyes: Conjunctivae are normal. Pupils are equal, round, and reactive to light. No scleral icterus.  Neck: Normal range of motion. Neck supple. No thyromegaly present.  Cardiovascular: Normal rate and regular rhythm.  Exam reveals no gallop and no friction rub.   No murmur heard. Sinus rhythm on the monitor.  Pulmonary/Chest: Effort normal and breath sounds normal. No respiratory distress. She has no wheezes. She has no  rales.  Abdominal: Soft. Bowel sounds are normal. She exhibits no distension. There is no tenderness. There is no rebound.  Musculoskeletal: Normal range of motion.  Neurological: She is alert and oriented to person, place, and time.  Skin: Skin is warm and dry. No rash noted.  Psychiatric: She has a normal mood and affect. Her behavior is normal.    ED Course  Procedures (including critical care time) Labs Review Labs Reviewed  CBG MONITORING, ED - Abnormal; Notable for the following:    Glucose-Capillary 101 (*)    All other components within normal limits  CBC  BASIC METABOLIC PANEL  MAGNESIUM  CBG MONITORING, ED  I-STAT TROPOININ, ED    Imaging Review No results found.   EKG Interpretation None      MDM  Final diagnoses:  Ventricular fibrillation    Her pacemaker was interrogated. I received a call from the [employer]. At 1740 5 PM tonight patient had a delivered shot for VS. Rate was 86 beats per minute medics arranged to 86. Gen. appropriate delivered shot. Over last 1 month she has been 100% V. sensitive. No additional episodes. Actually no history of prior recorded VF events.  The EKG shows sinus rhythm. Occasional unifocal PVCs. She will occasionally have salvos of 5-6 episodes of wide complex tachycardia in the room. She is asymptomatic to these. Electrolytes pending. Care discussed with on-call cardiologist. He is en route to the emergency room.    Tanna Furry, MD 10/02/13 2035

## 2013-10-02 NOTE — ED Notes (Signed)
Pt with 5 beats vtach ermd informed pt without co

## 2013-10-02 NOTE — H&P (Signed)
Cardiology H& P Note  Patient ID: [name], MRN: [id number], DOB/AGE: [birth year] 58 y.o. Admit date: 10/02/2013   Date of Consult: 10/02/2013 Primary Physician: Arman Filter, MD Primary Cardiologist: [name] ,     Chief Complaint: Syncope    Assessment and Plan:  -Syncope  -Likely Fast VT  In VF zone  s/p appropriate ICD shock . No obvious ppt cause such as ischmia or decompensation of heart failure. Likely function of underlying severe CMP. -End stage Cardiomyopathy Ef 10-15% , Class III , euvolemic and compensated  -CKD ( Cr 1.4-1.8 range)   Plan  -Admit to tele  -Pt to be seen by Electrophysiology in am to discuss possible Anti arrythmic options  -On Coreg , consider Amiodarone by EP     58 yr old female with hx of CAD s/p CABG and mitral valve annuloplasty  , DM , HTN ,CMP Ef 10-15% s/p ICD her with syncope    HPI: Pt states that she had come back from church used the bathroom and was watching the tv and the next thing she know she become lightheaded and landed  on the floor. She  had hurt her face in the process./ Device interrogation  In the ED shows one episode in the VF zone at 286bpm for which pt had delivery of shock therapy X1 . Device functioning appropriately .    Pt currently walks around the house with no difficlty. No orthopnea, PND , LE edema , DOE , chest pain, focal weakness,  bleeding diathesis , claudication , palpitation etc .  Reports medication compliance States that this is her first episode of ICD shock delivery that she is aware of since its implantation about 5 yrs ago  Tele in the ER shows frequent PVC and occasional runs of NSVT   Past Medical History  Diagnosis Date  . Coronary artery disease   . Diabetes mellitus   . Hypertension   . CHF (congestive heart failure)     Systolic.  EF 73-53%  . Polysubstance abuse     history of  (cocaine, tobacco and ETOH)      Most Recent Cardiac Studies: Catheterization 2013-January demonstrated patent  grafts cardiac index 2.2 ejection fraction 10-15%   ICD placement 2011   EKG 10/02/2013 NSR, IVCD, PVC    Surgical History:  Past Surgical History  Procedure Laterality Date  . Coronary artery bypass graft    . Cardiac catheterization    . Cardiac defibrillator placement      2011, Followed By Dr. Caryl Fields  . Tee without cardioversion N/A 06/02/2013    Procedure: TRANSESOPHAGEAL ECHOCARDIOGRAM (TEE);  Surgeon: Robin Dresser, MD;  Location: [facility];  Service: Cardiovascular;  Laterality: N/A;     Home Meds: Prior to Admission medications   Medication Sig Start Date End Date Taking? Authorizing Provider  allopurinol (ZYLOPRIM) 100 MG tablet Take 100-200 mg by mouth 2 (two) times daily. Takes 200 mg in the morning and 100 mg in the evening   Yes Historical Provider, MD  aspirin EC 81 MG tablet Take 81 mg by mouth daily. 09/30/13  Yes Robin Sprang, MD  atorvastatin (LIPITOR) 80 MG tablet Take 1 tablet (80 mg total) by mouth daily. 07/11/13  Yes Robin Artist, MD  carvedilol (COREG) 6.25 MG tablet Take 0.5 tablets (3.125 mg total) by mouth 2 (two) times daily with a meal. 06/02/13  Yes Robin Dresser, MD  digoxin (LANOXIN) 0.125 MG tablet Take 0.5 tablets (0.0625 mg  total) by mouth every other day. 04/25/13  Yes Rande Brunt, NP  furosemide (LASIX) 40 MG tablet Take 20 mg by mouth See admin instructions. Take two tablet in the morning, then one tablet in the evening 09/12/13  Yes Rande Brunt, NP  insulin glargine (LANTUS) 100 UNIT/ML injection Inject 38 Units into the skin at bedtime.   Yes Historical Provider, MD  insulin lispro (HUMALOG) 100 UNIT/ML injection Inject 5-8 Units into the skin 3 (three) times daily before meals.   Yes Historical Provider, MD  lisinopril (PRINIVIL,ZESTRIL) 5 MG tablet Take 2.5 mg by mouth 2 (two) times daily.    Yes Historical Provider, MD  nitroGLYCERIN (NITROSTAT) 0.4 MG SL tablet Place 1 tablet (0.4 mg total) under the tongue every 5 (five) minutes  as needed. For chest pain. 06/02/12  Yes Robin Sprang, MD  potassium chloride SA (K-DUR,KLOR-CON) 20 MEQ tablet Take 1 tablet (20 mEq total) by mouth daily. 06/29/13  Yes Robin Artist, MD  spironolactone (ALDACTONE) 25 MG tablet Take 25 mg by mouth daily.   Yes Historical Provider, MD    Inpatient Medications:     Allergies: No Known Allergies  History   Social History  . Marital Status: Widowed    Spouse Name: N/A    Number of Children: N/A  . Years of Education: N/A   Occupational History  . disable    Social History Main Topics  . Smoking status: Former Smoker    Quit date: 03/03/2011  . Smokeless tobacco: Not on file  . Alcohol Use: Yes     Comment: Had some drinks New Years, describes social drinkingC  . Drug Use: No     Comment: Has history of cocaine use, denies recent  . Sexual Activity: Not on file   Other Topics Concern  . Not on file   Social History Narrative  . No narrative on file     Family History  Problem Relation Age of Onset  . Heart attack Mother     Died age 37  . Diabetes Maternal Grandmother   . Heart attack Cousin   . Heart attack Maternal Uncle      Review of Systems: General: negative for chills, fever, night sweats or weight changes.  Cardiovascular:per HPI  Dermatological: negative for rash Respiratory: negative for cough or wheezing Urologic: negative for hematuria Abdominal: negative for nausea, vomiting, diarrhea, bright red blood per rectum, melena, or hematemesis Neurologic: negative for visual changes, syncope, or dizziness All other systems reviewed and are otherwise negative except as noted above.  Labs: No results found for this basename: CKTOTAL, CKMB, TROPONINI,  in the last 72 hours Lab Results  Component Value Date   WBC 7.0 10/02/2013   HGB 11.4* 10/02/2013   HCT 35.0* 10/02/2013   MCV 90.9 10/02/2013   PLT 153 10/02/2013   No results found for this basename: NA, K, CL, CO2, BUN, CREATININE, CALCIUM, LABALBU,  PROT, BILITOT, ALKPHOS, ALT, AST, GLUCOSE,  in the last 168 hours Lab Results  Component Value Date   CHOL 140 06/07/2013   HDL 36.40* 06/07/2013   LDLCALC 82 06/07/2013   TRIG 106.0 06/07/2013   No results found for this basename: DDIMER    Radiology/Studies:  Dg Hip Complete Left  09/10/2013   CLINICAL DATA:  Left hip pain  EXAM: LEFT HIP - COMPLETE 2+ VIEW  COMPARISON:  None.  FINDINGS: Mild degenerative changes of the hip joints are noted bilaterally. The pelvic ring is intact.  No acute fracture or dislocation is seen. No soft tissue abnormality is noted.  IMPRESSION: Degenerative change without acute abnormality.   Electronically Signed   By: Inez Catalina M.D.   On: 09/10/2013 09:08     Physical Exam: Blood pressure 108/68, pulse 71, temperature 97.9 F (36.6 C), temperature source Oral, resp. rate 20, height 5\' 4"  (1.626 m), weight 82.555 kg (182 lb), SpO2 100.00%. General: Well developed, well nourished, in no acute distress.  Neck: Negative for carotid bruits. JVD not elevated. Lungs: Clear bilaterally to auscultation without wheezes, rales, or rhonchi. Breathing is unlabored. Heart: midline scar , RRR with S1 S2. No murmurs, rubs, or gallops appreciated. Abdomen: Soft, non-tender, non-distended with normoactive bowel sounds. No hepatomegaly. No rebound/guarding. No obvious abdominal masses. Extremities: No clubbing or cyanosis. No edema.  Distal pedal pulses are 2+ and equal bilaterally. Neuro: Alert and oriented X 3. No facial asymmetry. No focal deficit. Moves all extremities spontaneously. Psych:  Responds to questions appropriately with a normal affect.    Cory Roughen, A M.D  10/02/2013, 8:49 PM

## 2013-10-03 DIAGNOSIS — I472 Ventricular tachycardia: Secondary | ICD-10-CM | POA: Diagnosis present

## 2013-10-03 DIAGNOSIS — I2582 Chronic total occlusion of coronary artery: Secondary | ICD-10-CM | POA: Diagnosis present

## 2013-10-03 DIAGNOSIS — I4901 Ventricular fibrillation: Secondary | ICD-10-CM | POA: Diagnosis not present

## 2013-10-03 DIAGNOSIS — I509 Heart failure, unspecified: Secondary | ICD-10-CM | POA: Diagnosis present

## 2013-10-03 DIAGNOSIS — I2510 Atherosclerotic heart disease of native coronary artery without angina pectoris: Secondary | ICD-10-CM | POA: Diagnosis not present

## 2013-10-03 DIAGNOSIS — Z01810 Encounter for preprocedural cardiovascular examination: Secondary | ICD-10-CM | POA: Diagnosis not present

## 2013-10-03 DIAGNOSIS — Z95810 Presence of automatic (implantable) cardiac defibrillator: Secondary | ICD-10-CM | POA: Diagnosis not present

## 2013-10-03 DIAGNOSIS — Z9861 Coronary angioplasty status: Secondary | ICD-10-CM | POA: Diagnosis not present

## 2013-10-03 DIAGNOSIS — I5022 Chronic systolic (congestive) heart failure: Secondary | ICD-10-CM | POA: Diagnosis not present

## 2013-10-03 DIAGNOSIS — I059 Rheumatic mitral valve disease, unspecified: Secondary | ICD-10-CM | POA: Diagnosis not present

## 2013-10-03 DIAGNOSIS — J811 Chronic pulmonary edema: Secondary | ICD-10-CM | POA: Diagnosis not present

## 2013-10-03 DIAGNOSIS — I498 Other specified cardiac arrhythmias: Secondary | ICD-10-CM | POA: Diagnosis not present

## 2013-10-03 DIAGNOSIS — I429 Cardiomyopathy, unspecified: Secondary | ICD-10-CM | POA: Diagnosis present

## 2013-10-03 DIAGNOSIS — I2589 Other forms of chronic ischemic heart disease: Secondary | ICD-10-CM | POA: Diagnosis present

## 2013-10-03 DIAGNOSIS — R55 Syncope and collapse: Secondary | ICD-10-CM | POA: Diagnosis present

## 2013-10-03 DIAGNOSIS — N289 Disorder of kidney and ureter, unspecified: Secondary | ICD-10-CM | POA: Diagnosis not present

## 2013-10-03 DIAGNOSIS — Z951 Presence of aortocoronary bypass graft: Secondary | ICD-10-CM | POA: Diagnosis not present

## 2013-10-03 DIAGNOSIS — F1410 Cocaine abuse, uncomplicated: Secondary | ICD-10-CM | POA: Diagnosis present

## 2013-10-03 DIAGNOSIS — I129 Hypertensive chronic kidney disease with stage 1 through stage 4 chronic kidney disease, or unspecified chronic kidney disease: Secondary | ICD-10-CM | POA: Diagnosis present

## 2013-10-03 DIAGNOSIS — Z87891 Personal history of nicotine dependence: Secondary | ICD-10-CM | POA: Diagnosis not present

## 2013-10-03 DIAGNOSIS — N183 Chronic kidney disease, stage 3 unspecified: Secondary | ICD-10-CM | POA: Diagnosis not present

## 2013-10-03 DIAGNOSIS — Z8249 Family history of ischemic heart disease and other diseases of the circulatory system: Secondary | ICD-10-CM | POA: Diagnosis not present

## 2013-10-03 DIAGNOSIS — Z23 Encounter for immunization: Secondary | ICD-10-CM | POA: Diagnosis not present

## 2013-10-03 DIAGNOSIS — Z794 Long term (current) use of insulin: Secondary | ICD-10-CM | POA: Diagnosis not present

## 2013-10-03 DIAGNOSIS — R579 Shock, unspecified: Secondary | ICD-10-CM | POA: Diagnosis not present

## 2013-10-03 DIAGNOSIS — E119 Type 2 diabetes mellitus without complications: Secondary | ICD-10-CM | POA: Diagnosis present

## 2013-10-03 DIAGNOSIS — I4729 Other ventricular tachycardia: Secondary | ICD-10-CM | POA: Diagnosis present

## 2013-10-03 DIAGNOSIS — Z833 Family history of diabetes mellitus: Secondary | ICD-10-CM | POA: Diagnosis not present

## 2013-10-03 DIAGNOSIS — I252 Old myocardial infarction: Secondary | ICD-10-CM | POA: Diagnosis not present

## 2013-10-03 DIAGNOSIS — Z7982 Long term (current) use of aspirin: Secondary | ICD-10-CM | POA: Diagnosis not present

## 2013-10-03 DIAGNOSIS — Z45018 Encounter for adjustment and management of other part of cardiac pacemaker: Secondary | ICD-10-CM | POA: Diagnosis not present

## 2013-10-03 LAB — COMPREHENSIVE METABOLIC PANEL
ALT: 21 U/L (ref 0–35)
AST: 19 U/L (ref 0–37)
Albumin: 3.9 g/dL (ref 3.5–5.2)
Alkaline Phosphatase: 77 U/L (ref 39–117)
Anion gap: 13 (ref 5–15)
BUN: 50 mg/dL — ABNORMAL HIGH (ref 6–23)
CALCIUM: 9.3 mg/dL (ref 8.4–10.5)
CO2: 24 meq/L (ref 19–32)
Chloride: 102 mEq/L (ref 96–112)
Creatinine, Ser: 1.57 mg/dL — ABNORMAL HIGH (ref 0.50–1.10)
GFR, EST AFRICAN AMERICAN: 41 mL/min — AB (ref >90)
GFR, EST NON AFRICAN AMERICAN: 36 mL/min — AB (ref >90)
GLUCOSE: 119 mg/dL — AB (ref 70–99)
Potassium: 4.4 mEq/L (ref 3.7–5.3)
SODIUM: 139 meq/L (ref 137–147)
Total Bilirubin: 0.2 mg/dL — ABNORMAL LOW (ref 0.3–1.2)
Total Protein: 7.4 g/dL (ref 6.0–8.3)

## 2013-10-03 LAB — BASIC METABOLIC PANEL
Anion gap: 13 (ref 5–15)
BUN: 47 mg/dL — ABNORMAL HIGH (ref 6–23)
CALCIUM: 9.6 mg/dL (ref 8.4–10.5)
CHLORIDE: 101 meq/L (ref 96–112)
CO2: 25 meq/L (ref 19–32)
Creatinine, Ser: 1.6 mg/dL — ABNORMAL HIGH (ref 0.50–1.10)
GFR calc Af Amer: 41 mL/min — ABNORMAL LOW (ref >90)
GFR calc non Af Amer: 35 mL/min — ABNORMAL LOW (ref >90)
GLUCOSE: 174 mg/dL — AB (ref 70–99)
POTASSIUM: 4.7 meq/L (ref 3.7–5.3)
SODIUM: 139 meq/L (ref 137–147)

## 2013-10-03 LAB — GLUCOSE, CAPILLARY
GLUCOSE-CAPILLARY: 147 mg/dL — AB (ref 70–99)
Glucose-Capillary: 183 mg/dL — ABNORMAL HIGH (ref 70–99)
Glucose-Capillary: 73 mg/dL (ref 70–99)
Glucose-Capillary: 146 mg/dL — ABNORMAL HIGH (ref 70–99)

## 2013-10-03 LAB — MAGNESIUM: Magnesium: 2.2 mg/dL (ref 1.5–2.5)

## 2013-10-03 LAB — PROTIME-INR
INR: 1.18 (ref 0.00–1.49)
PROTHROMBIN TIME: 15 s (ref 11.6–15.2)

## 2013-10-03 MED ORDER — SODIUM CHLORIDE 0.9 % IV SOLN: INTRAVENOUS | Status: DC

## 2013-10-03 MED ORDER — DIPHENHYDRAMINE HCL 50 MG/ML IJ SOLN
25.0000 mg | Freq: Once | INTRAMUSCULAR | Status: AC
  Administered 2013-10-03: 25 mg via INTRAVENOUS
  Filled 2013-10-03: qty 1

## 2013-10-03 MED ORDER — SODIUM CHLORIDE 0.9 % IJ SOLN: 3.0000 mL | INTRAMUSCULAR | Status: DC | PRN

## 2013-10-03 MED ORDER — ASPIRIN EC 81 MG PO TBEC
81.0000 mg | DELAYED_RELEASE_TABLET | Freq: Every day | ORAL | Status: DC
  Administered 2013-10-05 – 2013-10-07 (×3): 81 mg via ORAL
  Filled 2013-10-03 (×4): qty 1

## 2013-10-03 MED ORDER — SODIUM CHLORIDE 0.9 % IJ SOLN
3.0000 mL | Freq: Two times a day (BID) | INTRAMUSCULAR | Status: DC
  Administered 2013-10-03 – 2013-10-04 (×2): 3 mL via INTRAVENOUS

## 2013-10-03 MED ORDER — ASPIRIN 81 MG PO CHEW
81.0000 mg | CHEWABLE_TABLET | ORAL | Status: AC
  Administered 2013-10-04: 81 mg via ORAL
  Filled 2013-10-03: qty 1

## 2013-10-03 MED ORDER — SODIUM CHLORIDE 0.9 % IV SOLN: 250.0000 mL | INTRAVENOUS | Status: DC | PRN

## 2013-10-03 MED ORDER — SODIUM CHLORIDE 0.9 % IV SOLN
INTRAVENOUS | Status: DC
  Administered 2013-10-04: via INTRAVENOUS

## 2013-10-03 MED ORDER — ACETAMINOPHEN 325 MG PO TABS
650.0000 mg | ORAL_TABLET | Freq: Four times a day (QID) | ORAL | Status: DC | PRN
  Administered 2013-10-03 – 2013-10-05 (×2): 650 mg via ORAL
  Filled 2013-10-03 (×2): qty 2

## 2013-10-03 NOTE — Progress Notes (Signed)
Pt has raised bumps on her anterior left leg, posterior left leg, left arm and posterior neck. Pt stated the bumps itch. Pt stated the patches started to form when she was being admitted. Md on call made aware. New order received. Will cont to monitor pt.

## 2013-10-03 NOTE — Progress Notes (Signed)
UR Completed Christinia Lambeth Graves-Bigelow, RN,BSN [phone]  

## 2013-10-03 NOTE — Consult Note (Signed)
ELECTROPHYSIOLOGY CONSULT NOTE    Patient ID: [name] MRN: [id number], DOB/AGE: 1955-04-09 58 y.o.  Admit date: 10/02/2013 Date of Consult: 10-03-13  Primary Physician: Arman Filter, MD Primary Cardiologist: [facility] Electrophyioslogist: Caryl Comes  Reason for Consultation: ICD shock  HPI:  [name] is a 58 y.o. female with a past medical history significant for CAD (s/p CABG), ICM, diabetes, hypertension, systolic heart failure, mitral valve disease (s/p repair at time of CABG), and prior polysubstance abuse (none since 2013).  She has been doing well and was seen by Dr Caryl Comes in the office on 7-31.  CRT upgrade had been considered, but with IVCD was not felt to be beneficial.  She went to church yesterday, took a nap, got up to go into the kitchen and had an abrupt syncopal spell.  There was no prodrome.  She has not had recent chest pain, increased shortness of breath, lower extremity edema, palpitations, or dizziness.  This is her first ICD shock. Device interrogation demonstrates an episode of PMVT that regularizes at a cycle length of around 289msec terminated by HV shock.  Troponin in ER was negative.  With prior MI, she had significant substernal chest pressure. She has not had any of this recently.  She was at the beach last week and had no difficulties.    Last echo 06-2013 (TEE) demonstrated EF 15%, severe MR.  Last RHC 05-2013 demonstrated mild to moderately elevated left sided filling pressures, low normal cardiac output.  Last LHC 2007 severe multivessel CAD --> CABG at that time.   EP has been asked to evaluate for treatment options.    ROS is negative except as outlined above.   Past Medical History  Diagnosis Date  . Coronary artery disease   . Diabetes mellitus   . Hypertension   . CHF (congestive heart failure)     Systolic.  EF 17-49%  . Polysubstance abuse     history of  (cocaine, tobacco and ETOH)     Surgical History:  Past Surgical History    Procedure Laterality Date  . Coronary artery bypass graft    . Cardiac catheterization    . Cardiac defibrillator placement      2011, Followed By Dr. Caryl Comes  . Tee without cardioversion N/A 06/02/2013    Procedure: TRANSESOPHAGEAL ECHOCARDIOGRAM (TEE);  Surgeon: Larey Dresser, MD;  Location: [place];  Service: Cardiovascular;  Laterality: N/A;     Prescriptions prior to admission  Medication Sig Dispense Refill  . allopurinol (ZYLOPRIM) 100 MG tablet Take 100-200 mg by mouth 2 (two) times daily. Takes 200 mg in the morning and 100 mg in the evening      . aspirin EC 81 MG tablet Take 81 mg by mouth daily.      [name] atorvastatin (LIPITOR) 80 MG tablet Take 1 tablet (80 mg total) by mouth daily.  30 tablet  11  . carvedilol (COREG) 6.25 MG tablet Take 0.5 tablets (3.125 mg total) by mouth 2 (two) times daily with a meal.  60 tablet  3  . digoxin (LANOXIN) 0.125 MG tablet Take 0.5 tablets (0.0625 mg total) by mouth every other day.  8 tablet  3  . furosemide (LASIX) 40 MG tablet Take 20 mg by mouth See admin instructions. Take two tablet in the morning, then one tablet in the evening      . insulin glargine (LANTUS) 100 UNIT/ML injection Inject 38 Units into the skin at bedtime.      [name]  insulin lispro (HUMALOG) 100 UNIT/ML injection Inject 5-8 Units into the skin 3 (three) times daily before meals.      [name] lisinopril (PRINIVIL,ZESTRIL) 5 MG tablet Take 2.5 mg by mouth 2 (two) times daily.       . nitroGLYCERIN (NITROSTAT) 0.4 MG SL tablet Place 1 tablet (0.4 mg total) under the tongue every 5 (five) minutes as needed. For chest pain.  25 tablet  11  . potassium chloride SA (K-DUR,KLOR-CON) 20 MEQ tablet Take 1 tablet (20 mEq total) by mouth daily.  30 tablet  9  . spironolactone (ALDACTONE) 25 MG tablet Take 25 mg by mouth daily.        Inpatient Medications:  . allopurinol  300 mg Oral QHS  . aspirin EC  81 mg Oral Daily  . aspirin EC  81 mg Oral Daily  . atorvastatin  80 mg Oral Daily   . carvedilol  3.125 mg Oral BID WC  . digoxin  0.0625 mg Oral QODAY  . furosemide  80 mg Oral Daily  . heparin  5,000 Units Subcutaneous 3 times per day  . insulin aspart  0-5 Units Subcutaneous QHS  . insulin aspart  0-9 Units Subcutaneous TID WC  . insulin aspart  5 Units Subcutaneous TID WC  . insulin glargine  38 Units Subcutaneous QHS  . lisinopril  2.5 mg Oral BID  . pneumococcal 23 valent vaccine  0.5 mL Intramuscular Tomorrow-1000  . potassium chloride SA  20 mEq Oral Daily  . spironolactone  25 mg Oral Daily    Allergies: No Known Allergies  History   Social History  . Marital Status: Widowed    Spouse Name: N/A    Number of Children: N/A  . Years of Education: N/A   Occupational History  . disable    Social History Main Topics  . Smoking status: Former Smoker    Quit date: 03/03/2011  . Smokeless tobacco: Not on file  . Alcohol Use: Yes     Comment: Had some drinks New Years, describes social drinkingC  . Drug Use: No     Comment: Has history of cocaine use, denies recent  . Sexual Activity: Not on file   Other Topics Concern  . Not on file   Social History Narrative  . No narrative on file     Family History  Problem Relation Age of Onset  . Heart attack Mother     Died age 65  . Diabetes Maternal Grandmother   . Heart attack Cousin   . Heart attack Maternal Uncle      BP 94/52  Pulse 89  Temp(Src) 97.8 F (36.6 C) (Oral)  Resp 18  Ht 5\' 4"  (1.626 m)  Wt 182 lb (82.555 kg)  BMI 31.22 kg/m2  SpO2 96%  Physical Exam: Alert and oriented in no acute distress HENT- normal Eyes- EOMI, without scleral icterus Skin- warm and dry; without rashes LN-neg Neck- supple without thyromegaly, JVP-flat, carotids brisk and full without bruits Back-without CVAT or kyphosis Lungs-clear to auscultation CV-Regular rate and rhythm, nl S1 and S2, no murmurs gallops or rubs, S4-absent Abd-soft with active bowel sounds; no midline pulsation or  hepatomegaly Pulses-intact femoral and distal MKS-without gross deformity Neuro- Ax O, CN3-12 intact, grossly normal motor and sensory function Affect engaging   Labs:   Lab Results  Component Value Date   WBC 7.0 10/02/2013   HGB 11.4* 10/02/2013   HCT 35.0* 10/02/2013   MCV 90.9 10/02/2013   PLT  153 10/02/2013    Recent Labs Lab 10/03/13 0520  NA 139  K 4.4  CL 102  CO2 24  BUN 50*  CREATININE 1.57*  CALCIUM 9.3  PROT 7.4  BILITOT 0.2*  ALKPHOS 77  ALT 21  AST 19  GLUCOSE 119*    Radiology/Studies: Dg Hip Complete Left 09/10/2013   CLINICAL DATA:  Left hip pain  EXAM: LEFT HIP - COMPLETE 2+ VIEW  COMPARISON:  None.  FINDINGS: Mild degenerative changes of the hip joints are noted bilaterally. The pelvic ring is intact. No acute fracture or dislocation is seen. No soft tissue abnormality is noted.  IMPRESSION: Degenerative change without acute abnormality.   Electronically Signed   By: Inez Catalina M.D.   On: 09/10/2013 09:08   Ct Head Wo Contrast 10/02/2013   CLINICAL DATA:  Headache.  Fell and hit head.  EXAM: CT HEAD WITHOUT CONTRAST  CT MAXILLOFACIAL WITHOUT CONTRAST  TECHNIQUE: Multidetector CT imaging of the head and maxillofacial structures were performed using the standard protocol without intravenous contrast. Multiplanar CT image reconstructions of the maxillofacial structures were also generated.  COMPARISON:  None.  FINDINGS: CT HEAD FINDINGS  Mild chronic microvascular ischemia. Ventricle size is normal. Negative for acute infarct. Negative for hemorrhage or mass. Negative for skull fracture. Atherosclerotic disease.  CT MAXILLOFACIAL FINDINGS  Possible fracture of the right orbit which is mildly depressed with mild soft tissue swelling. This could be due to acute fracture. No fracture of the nasal bone or mandible.  Mild mucosal edema in the paranasal sinuses without air-fluid level.  IMPRESSION: No acute intracranial abnormality.  Possible mildly depressed fracture right  orbital floor.   Electronically Signed   By: Franchot Gallo M.D.   On: 10/02/2013 22:41    EKC:MKLKJ rhythm, PVC's, rate 74, normal intervals, QRS 128  TELEMETRY: sinus rhythm with frequent multifocal PVC's, short runs NSVT  DEVICE HISTORY: MDT Protecta single chamber ICD implanted by Dr Sallyanne Kuster 2011  Assessment/Plan:  VT  Polymorphic  ICD Medtronic  Ischemic Cardiomyopathy//Severe MR Prior CABG   Has been having symptoms of increasing sluggishness and fatigue over recent months,  Attempts to decrease diuretics resulted in reaccumulation of fluids  I would favor cath for culprits, although i am concerned taht this is electormechanical stuff from worsening LV function   Await input from DB  Patient aware no driving X6 months following appropriate ICD therapy.

## 2013-10-03 NOTE — Progress Notes (Signed)
Advanced Heart Failure Team Consult Note  Referring Physician: Dr. Caryl Comes Primary Physician: [facility] Primary Cardiologist:  Dr. Haroldine Laws  Reason for Consultation: HF  HPI:    57 year old female with history of HTN, DM2, past polysubstance abuse (ETOH, tobacco, cocaine), CAD s/p CABG x5 with MV annuloplasty (2007), ICM s/p ICD, chronic systolic HF, severe MR, CKD stage III and PAD.   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 At time of cath LAD, LCX and RCA occluded. EF 45%. Had abrupt stent occlusion the next day and had to go back to the lab. 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   She has been followed closely in the HF clinic and was last seen 09/12/13 and at that time was doing well. She presented to the ED 8/2 with syncope s/p appropriate ICD shock. Denies any CP, increased SOB, LE edema or palpitations. Device interrogation demonstrates an episode of PMVT that regularizes at a cycle length of around 248msec terminated by HV shock. Troponin in ER was negative. With prior MI, she had significant substernal chest pressure. She has not had any of this recently.   Review of Systems: [y] = yes, [ ]  = no   General: Weight gain [ ] ; Weight loss [ ] ; Anorexia [ ] ; Fatigue [ N]; Fever [ ] ; Chills [ ] ; Weakness [ ]   Cardiac: Chest pain/pressure Aqua.Slicker ]; Resting SOB Aqua.Slicker ]; Exertional SOB [Y ]; Orthopnea [ N]; Pedal Edema Aqua.Slicker ]; Palpitations Aqua.Slicker ]; Syncope [Y ]; Presyncope [ ] ; Paroxysmal nocturnal dyspnea[ ]   Pulmonary: Cough [ ] ; Wheezing[ ] ; Hemoptysis[ ] ; Sputum [ ] ; Snoring [ ]   GI: Vomiting[ ] ; Dysphagia[ ] ; Melena[ ] ; Hematochezia [ ] ; Heartburn[ ] ; Abdominal pain [ ] ; Constipation [ ] ; Diarrhea [ ] ; BRBPR [ ]   GU: Hematuria[ ] ; Dysuria [ ] ; Nocturia[ ]   Vascular: Pain in legs with walking [ ] ; Pain in feet with lying flat [ ] ; Non-healing sores [ ] ; Stroke [ ] ; TIA [ ] ; Slurred speech [ ] ;   Neuro: Headaches[ ] ; Vertigo[ ] ; Seizures[ ] ; Paresthesias[ ] ;Blurred vision [ ] ; Diplopia [ ] ; Vision changes [ ]   Ortho/Skin: Arthritis [ ] ; Joint pain [ ] ; Muscle pain [ ] ; Joint swelling [ ] ; Back Pain [ ] ; Rash [ ]   Psych: Depression[N ]; Anxiety[ ]   Heme: Bleeding problems [ ] ; Clotting disorders [ ] ; Anemia [ ]   Endocrine: Diabetes [ ] ; Thyroid dysfunction[ ]   Home Medications Prior to Admission medications   Medication Sig Start Date End Date Taking? Authorizing Provider  allopurinol (ZYLOPRIM) 100 MG tablet Take 100-200 mg by mouth 2 (two) times daily. Takes 200 mg in the morning and 100 mg in the evening   Yes Historical Provider, MD  aspirin EC 81 MG tablet Take 81 mg by mouth daily. 09/30/13  Yes Deboraha Sprang, MD  atorvastatin (LIPITOR) 80 MG tablet Take 1 tablet (80 mg total) by mouth daily. 07/11/13  Yes Jolaine Artist, MD  carvedilol (COREG) 6.25 MG tablet Take 0.5 tablets (3.125 mg total) by mouth 2 (two) times daily with a meal. 06/02/13  Yes Larey Dresser, MD  digoxin (LANOXIN) 0.125 MG tablet Take 0.5 tablets (0.0625 mg total) by mouth every other day. 04/25/13  Yes Rande Brunt, NP  furosemide (LASIX) 40 MG tablet Take 20 mg by mouth See admin instructions. Take two tablet in the  morning, then one tablet in the evening 09/12/13  Yes Rande Brunt, NP  insulin glargine (LANTUS) 100 UNIT/ML injection Inject 38 Units into the skin at bedtime.   Yes Historical Provider, MD  insulin lispro (HUMALOG) 100 UNIT/ML injection Inject 5-8 Units into the skin 3 (three) times daily before meals.   Yes Historical Provider, MD  lisinopril (PRINIVIL,ZESTRIL) 5 MG tablet Take 2.5 mg by mouth 2 (two) times daily.    Yes Historical Provider, MD  nitroGLYCERIN (NITROSTAT) 0.4 MG SL tablet Place 1 tablet (0.4 mg total) under the tongue every 5 (five) minutes as needed. For chest pain. 06/02/12  Yes Deboraha Sprang, MD  potassium chloride SA (K-DUR,KLOR-CON) 20 MEQ tablet Take 1 tablet (20 mEq  total) by mouth daily. 06/29/13  Yes Jolaine Artist, MD  spironolactone (ALDACTONE) 25 MG tablet Take 25 mg by mouth daily.   Yes Historical Provider, MD    Past Medical History: Past Medical History  Diagnosis Date  . Coronary artery disease   . Diabetes mellitus   . Hypertension   . CHF (congestive heart failure)     Systolic.  EF 95-62%  . Polysubstance abuse     history of  (cocaine, tobacco and ETOH)    Past Surgical History: Past Surgical History  Procedure Laterality Date  . Coronary artery bypass graft    . Cardiac catheterization    . Cardiac defibrillator placement      2011, Followed By Dr. Caryl Comes  . Tee without cardioversion N/A 06/02/2013    Procedure: TRANSESOPHAGEAL ECHOCARDIOGRAM (TEE);  Surgeon: Larey Dresser, MD;  Location: [facility] ENDOSCOPY;  Service: Cardiovascular;  Laterality: N/A;    Family History: Family History  Problem Relation Age of Onset  . Heart attack Mother     Died age 56  . Diabetes Maternal Grandmother   . Heart attack Cousin   . Heart attack Maternal Uncle     Social History: History   Social History  . Marital Status: Widowed    Spouse Name: N/A    Number of Children: N/A  . Years of Education: N/A   Occupational History  . disable    Social History Main Topics  . Smoking status: Former Smoker    Quit date: 03/03/2011  . Smokeless tobacco: None  . Alcohol Use: Yes     Comment: Had some drinks New Years, describes social drinkingC  . Drug Use: No     Comment: Has history of cocaine use, denies recent  . Sexual Activity: None   Other Topics Concern  . None   Social History Narrative  . None    Allergies:  No Known Allergies  Objective:    Vital Signs:   Temp:  [97.8 F (36.6 C)-97.9 F (36.6 C)] 97.8 F (36.6 C) (08/03 0500) Pulse Rate:  [65-89] 85 (08/03 0810) Resp:  [13-20] 18 (08/03 0500) BP: (81-108)/(46-89) 90/46 mmHg (08/03 0810) SpO2:  [96 %-100 %] 96 % (08/03 0500) Weight:  [182 lb (82.555 kg)]  182 lb (82.555 kg) (08/02 2231)    Weight change: Filed Weights   10/02/13 1820 10/02/13 2231  Weight: 182 lb (82.555 kg) 182 lb (82.555 kg)    Intake/Output:   Intake/Output Summary (Last 24 hours) at 10/03/13 1129 Last data filed at 10/03/13 0900  Gross per 24 hour  Intake    480 ml  Output    300 ml  Net    180 ml     Physical Exam: General:  Well appearing. No resp difficulty, sitting in chair HEENT: normal Neck: supple. JVP flat . Carotids 2+ bilat; no bruits. No lymphadenopathy or thryomegaly appreciated. Cor: PMI nondisplaced. Regular rate & rhythm. No rubs, gallops or murmurs. Lungs: clear Abdomen: soft, nontender, nondistended. No hepatosplenomegaly. No bruits or masses. Good bowel sounds. Extremities: no cyanosis, clubbing, rash, edema Neuro: alert & orientedx3, cranial nerves grossly intact. moves all 4 extremities w/o difficulty. Affect pleasant  Telemetry: SR 80s  Labs: Basic Metabolic Panel:  Recent Labs Lab 10/02/13 2033 10/03/13 0520  NA 138 139  K 4.6 4.4  CL 99 102  CO2 23 24  GLUCOSE 112* 119*  BUN 52* 50*  CREATININE 1.80* 1.57*  CALCIUM 9.6 9.3  MG 2.2 2.2    Liver Function Tests:  Recent Labs Lab 10/03/13 0520  AST 19  ALT 21  ALKPHOS 77  BILITOT 0.2*  PROT 7.4  ALBUMIN 3.9   No results found for this basename: LIPASE, AMYLASE,  in the last 168 hours No results found for this basename: AMMONIA,  in the last 168 hours  CBC:  Recent Labs Lab 10/02/13 2033  WBC 7.0  HGB 11.4*  HCT 35.0*  MCV 90.9  PLT 153    Cardiac Enzymes: No results found for this basename: CKTOTAL, CKMB, CKMBINDEX, TROPONINI,  in the last 168 hours  BNP: BNP (last 3 results)  Recent Labs  11/16/12 0834 06/07/13 1041  PROBNP 181.0* 899.80*    CBG:  Recent Labs Lab 10/02/13 1904 10/03/13 0759  GLUCAP 101* 183*    Coagulation Studies: No results found for this basename: LABPROT, INR,  in the last 72 hours  Other  results:   Imaging: Ct Head Wo Contrast  10/02/2013   CLINICAL DATA:  Headache.  Fell and hit head.  EXAM: CT HEAD WITHOUT CONTRAST  CT MAXILLOFACIAL WITHOUT CONTRAST  TECHNIQUE: Multidetector CT imaging of the head and maxillofacial structures were performed using the standard protocol without intravenous contrast. Multiplanar CT image reconstructions of the maxillofacial structures were also generated.  COMPARISON:  None.  FINDINGS: CT HEAD FINDINGS  Mild chronic microvascular ischemia. Ventricle size is normal. Negative for acute infarct. Negative for hemorrhage or mass. Negative for skull fracture. Atherosclerotic disease.  CT MAXILLOFACIAL FINDINGS  Possible fracture of the right orbit which is mildly depressed with mild soft tissue swelling. This could be due to acute fracture. No fracture of the nasal bone or mandible.  Mild mucosal edema in the paranasal sinuses without air-fluid level.  IMPRESSION: No acute intracranial abnormality.  Possible mildly depressed fracture right orbital floor.   Electronically Signed   By: Franchot Gallo M.D.   On: 10/02/2013 22:41   Ct Maxillofacial Wo Cm  10/02/2013   CLINICAL DATA:  Headache.  Fell and hit head.  EXAM: CT HEAD WITHOUT CONTRAST  CT MAXILLOFACIAL WITHOUT CONTRAST  TECHNIQUE: Multidetector CT imaging of the head and maxillofacial structures were performed using the standard protocol without intravenous contrast. Multiplanar CT image reconstructions of the maxillofacial structures were also generated.  COMPARISON:  None.  FINDINGS: CT HEAD FINDINGS  Mild chronic microvascular ischemia. Ventricle size is normal. Negative for acute infarct. Negative for hemorrhage or mass. Negative for skull fracture. Atherosclerotic disease.  CT MAXILLOFACIAL FINDINGS  Possible fracture of the right orbit which is mildly depressed with mild soft tissue swelling. This could be due to acute fracture. No fracture of the nasal bone or mandible.  Mild mucosal edema in the  paranasal sinuses without air-fluid level.  IMPRESSION: No acute intracranial abnormality.  Possible mildly depressed fracture right orbital floor.   Electronically Signed   By: Franchot Gallo M.D.   On: 10/02/2013 22:41      Medications:     Current Medications: . allopurinol  300 mg Oral QHS  . aspirin EC  81 mg Oral Daily  . atorvastatin  80 mg Oral Daily  . carvedilol  3.125 mg Oral BID WC  . digoxin  0.0625 mg Oral QODAY  . furosemide  80 mg Oral Daily  . heparin  5,000 Units Subcutaneous 3 times per day  . insulin aspart  0-5 Units Subcutaneous QHS  . insulin aspart  0-9 Units Subcutaneous TID WC  . insulin aspart  5 Units Subcutaneous TID WC  . insulin glargine  38 Units Subcutaneous QHS  . lisinopril  2.5 mg Oral BID  . potassium chloride SA  20 mEq Oral Daily  . spironolactone  25 mg Oral Daily     Infusions:      Assessment:   1) Syncope - s/p appropriate ICD shock 2) Chronic systolic HF - EF 07%, severe MR (06/2013) 3) Severe MR 4) CAD s/p CABG  Plan/Discussion:    Ms. Eppes is well known to the HF team and was last seen in June and was doing well. Reports that prior to syncopal episode she got light headed and then woke up on the floor after appropriate ICD shock. She reports she has not had any CP, increased SOB, orthopnea or edema. Over the past couple months she has had a slight increase in fatigue and there was concern about low output, however on RHC her hemodynamics were normal and her repeat CPX was slightly better from previous tests.  ICD interrogated and one episode in VF zone.   EP consulted and concern that may be having some ischemia causing electrical issues so we will schedule her for [facility] tomorrow. Will hydrate overnight to try and protect renal function.   Length of Stay: 1  Rande Brunt NP-C 10/03/2013, 11:29 AM  Advanced Heart Failure Team Pager [phone] (M-F; 7a - 4p)  Please contact [facility] for night-coverage after  hours (4p -7a ) and weekends on [email or web address]  Patient seen and examined with Junie Bame, NP. We discussed all aspects of the encounter. I agree with the assessment and plan as stated above. Case d/w Dr. Caryl Comes as well. Denies frank ischemic symptoms and HF seems well controlled but ICD interrogation shows polymorphic VT concerning for ischemic-mediated VT. Will hydrate overnight gently and plan cath in am. We discussed risks of worsening renal failure and possible need for HD.   Benay Spice 4:36 PM

## 2013-10-04 ENCOUNTER — Encounter (HOSPITAL_COMMUNITY)
Admission: EM | Disposition: A | Discharge status: Home / Self Care | Payer: Self-pay | Source: Home / Self Care | Attending: Internal Medicine

## 2013-10-04 DIAGNOSIS — I2510 Atherosclerotic heart disease of native coronary artery without angina pectoris: Secondary | ICD-10-CM

## 2013-10-04 HISTORY — PX: LEFT AND RIGHT HEART CATHETERIZATION WITH CORONARY/GRAFT ANGIOGRAM: SHX5448

## 2013-10-04 LAB — POCT I-STAT 3, VENOUS BLOOD GAS (G3P V)
ACID-BASE DEFICIT: 1 mmol/L (ref 0.0–2.0)
Acid-base deficit: 1 mmol/L (ref 0.0–2.0)
BICARBONATE: 24.9 meq/L — AB (ref 20.0–24.0)
Bicarbonate: 25.4 mEq/L — ABNORMAL HIGH (ref 20.0–24.0)
Bicarbonate: 24.6 mEq/L — ABNORMAL HIGH (ref 20.0–24.0)
O2 SAT: 43 %
O2 SAT: 55 %
O2 Saturation: 45 %
PCO2 VEN: 45.1 mmHg (ref 45.0–50.0)
PH VEN: 7.35 — AB (ref 7.250–7.300)
PH VEN: 7.359 — AB (ref 7.250–7.300)
PO2 VEN: 26 mmHg — AB (ref 30.0–45.0)
TCO2: 26 mmol/L (ref 0–100)
TCO2: 27 mmol/L (ref 0–100)
TCO2: 26 mmol/L (ref 0–100)
pCO2, Ven: 45.1 mmHg (ref 45.0–50.0)
pCO2, Ven: 44.6 mmHg — ABNORMAL LOW (ref 45.0–50.0)
pH, Ven: 7.35 — ABNORMAL HIGH (ref 7.250–7.300)
pO2, Ven: 26 mmHg — CL (ref 30.0–45.0)
pO2, Ven: 31 mmHg (ref 30.0–45.0)

## 2013-10-04 LAB — GLUCOSE, CAPILLARY
GLUCOSE-CAPILLARY: 98 mg/dL (ref 70–99)
GLUCOSE-CAPILLARY: 188 mg/dL — AB (ref 70–99)
Glucose-Capillary: 143 mg/dL — ABNORMAL HIGH (ref 70–99)
Glucose-Capillary: 139 mg/dL — ABNORMAL HIGH (ref 70–99)

## 2013-10-04 LAB — COMPREHENSIVE METABOLIC PANEL
ALT: 19 U/L (ref 0–35)
AST: 17 U/L (ref 0–37)
Albumin: 3.9 g/dL (ref 3.5–5.2)
Alkaline Phosphatase: 76 U/L (ref 39–117)
Anion gap: 12 (ref 5–15)
BUN: 40 mg/dL — ABNORMAL HIGH (ref 6–23)
CALCIUM: 9.4 mg/dL (ref 8.4–10.5)
CO2: 22 meq/L (ref 19–32)
Chloride: 103 mEq/L (ref 96–112)
Creatinine, Ser: 1.34 mg/dL — ABNORMAL HIGH (ref 0.50–1.10)
GFR calc Af Amer: 50 mL/min — ABNORMAL LOW (ref >90)
GFR, EST NON AFRICAN AMERICAN: 43 mL/min — AB (ref >90)
Glucose, Bld: 119 mg/dL — ABNORMAL HIGH (ref 70–99)
Potassium: 4.6 mEq/L (ref 3.7–5.3)
Sodium: 137 mEq/L (ref 137–147)
Total Bilirubin: 0.2 mg/dL — ABNORMAL LOW (ref 0.3–1.2)
Total Protein: 7.4 g/dL (ref 6.0–8.3)

## 2013-10-04 LAB — MRSA PCR SCREENING: MRSA by PCR: NEGATIVE

## 2013-10-04 LAB — POCT I-STAT 3, ART BLOOD GAS (G3+)
Acid-base deficit: 1 mmol/L (ref 0.0–2.0)
BICARBONATE: 23.2 meq/L (ref 20.0–24.0)
O2 Saturation: 95 %
PCO2 ART: 35.8 mmHg (ref 35.0–45.0)
PH ART: 7.419 (ref 7.350–7.450)
TCO2: 24 mmol/L (ref 0–100)
pO2, Arterial: 76 mmHg — ABNORMAL LOW (ref 80.0–100.0)

## 2013-10-04 LAB — MAGNESIUM: Magnesium: 2.2 mg/dL (ref 1.5–2.5)

## 2013-10-04 SURGERY — LEFT AND RIGHT HEART CATHETERIZATION WITH CORONARY/GRAFT ANGIOGRAM
Anesthesia: LOCAL

## 2013-10-04 MED ORDER — ACETAMINOPHEN 325 MG PO TABS
650.0000 mg | ORAL_TABLET | ORAL | Status: DC | PRN
  Administered 2013-10-04 – 2013-10-05 (×3): 650 mg via ORAL
  Filled 2013-10-04 (×3): qty 2

## 2013-10-04 MED ORDER — NOREPINEPHRINE BITARTRATE 1 MG/ML IV SOLN
INTRAVENOUS | Status: AC
  Filled 2013-10-04: qty 4

## 2013-10-04 MED ORDER — NOREPINEPHRINE BITARTRATE 1 MG/ML IV SOLN
5.0000 ug/min | INTRAVENOUS | Status: DC
  Filled 2013-10-04: qty 4

## 2013-10-04 MED ORDER — MIDAZOLAM HCL 2 MG/2ML IJ SOLN
INTRAMUSCULAR | Status: AC
  Filled 2013-10-04: qty 2

## 2013-10-04 MED ORDER — HEPARIN (PORCINE) IN NACL 2-0.9 UNIT/ML-% IJ SOLN
INTRAMUSCULAR | Status: AC
  Filled 2013-10-04: qty 1000

## 2013-10-04 MED ORDER — SODIUM CHLORIDE 0.9 % IV SOLN: INTRAVENOUS | Status: DC | PRN

## 2013-10-04 MED ORDER — FENTANYL CITRATE 0.05 MG/ML IJ SOLN
INTRAMUSCULAR | Status: AC
  Filled 2013-10-04: qty 2

## 2013-10-04 MED ORDER — SODIUM CHLORIDE 0.9 % IV SOLN
INTRAVENOUS | Status: AC
  Administered 2013-10-04: 12:00:00 via INTRAVENOUS

## 2013-10-04 MED ORDER — LIDOCAINE HCL (PF) 1 % IJ SOLN
INTRAMUSCULAR | Status: AC
  Filled 2013-10-04: qty 30

## 2013-10-04 MED ORDER — HEPARIN SODIUM (PORCINE) 5000 UNIT/ML IJ SOLN: 5000.0000 [IU] | Freq: Three times a day (TID) | INTRAMUSCULAR | Status: DC

## 2013-10-04 MED ORDER — ONDANSETRON HCL 4 MG/2ML IJ SOLN: 4.0000 mg | Freq: Four times a day (QID) | INTRAMUSCULAR | Status: DC | PRN

## 2013-10-04 MED ORDER — DIPHENHYDRAMINE HCL 50 MG/ML IJ SOLN
25.0000 mg | Freq: Three times a day (TID) | INTRAMUSCULAR | Status: DC | PRN
  Administered 2013-10-04 – 2013-10-06 (×3): 25 mg via INTRAVENOUS
  Filled 2013-10-04 (×3): qty 1

## 2013-10-04 NOTE — Progress Notes (Signed)
Pt received from cath lab with Levophed 22mcg infusing. Noted pt having increased frequency of PVCs and short runs of VT on the monitor. Pt asymptomatic and VSS. Dr Haroldine Laws made aware. Orders received to decrease Levophed to 62mcg. Will continue to monitor closely.

## 2013-10-04 NOTE — Interval H&P Note (Signed)
Cath Lab Visit (complete for each Cath Lab visit)  Clinical Evaluation Leading to the Procedure:   ACS: No.  Non-ACS:    Anginal Classification: CCS III  Anti-ischemic medical therapy: Minimal Therapy (1 class of medications)  Non-Invasive Test Results: No non-invasive testing performed  Prior CABG: Previous CABG      History and Physical Interval Note:  10/04/2013 9:40 AM  [name]  has presented today for surgery, with the diagnosis of hf  The various methods of treatment have been discussed with the patient and family. After consideration of risks, benefits and other options for treatment, the patient has consented to  Procedure(s): LEFT AND RIGHT HEART CATHETERIZATION WITH CORONARY/GRAFT ANGIOGRAM (N/A) and possible angioplasty as a surgical intervention .  The patient's history has been reviewed, patient examined, no change in status, stable for surgery.  I have reviewed the patient's chart and labs.  Questions were answered to the patient's satisfaction.     Daniel Bensimhon

## 2013-10-04 NOTE — CV Procedure (Signed)
Cardiac Cath Procedure Note   Indication: Polymorphic VT. ICD shock  Procedures performed:  1) Right heart cathererization  2) Selective coronary angiography  3) Left heart catheterization    Description of procedure:   The risks and indication of the procedure were explained. Consent was signed and placed on the chart. An appropriate timeout was taken prior to the procedure. The right groin was prepped and draped in the routine sterile fashion and anesthetized with 1% local lidocaine.   A 5 FR arterial sheath was placed in the right femoral artery using a modified Seldinger technique. Standard catheters including a JL4, JR4 and angled pigtail were used. The JR4 was use for all the grafts except the SVG->D2 which was cannulated with an AR-1.   All catheter exchanges were made over a wire. An 8 FR venous sheath was placed in the right femoral vein using a modified Seldinger technique. A standard Swan-Ganz catheter was used for the procedure.   Complications: Developed junctional rhythm in the 40s with severe hypotension (SBP 40-50s) while obtaining access (suspect vagal). Resolved with IVF and levophed infusion.  Findings:  RA = 5 RV = 423/2/5 PA = 25/6 (14)  PCW = 12  Fick cardiac output/index = 3.2/1.7 (duing junctional rhythm) PVR =0.7 Woods FA sat = 95%  PA sat = 45%, 47% PA sat = 55% (with levophed 5 at end of case)  Ao Pressure: 117/60 (85) LV Pressure: 110/8/18  There was no signficant gradient across the aortic valve on pullback.   Left main: Normal   LAD: Totally occluded proximally after first diagonal. 1st diagonal 70% prox. Upper branch fills well antegrade. Lower branch has competitive flow from vein graft.   LCX: Diffuse 99% throughout midsection. Ramus occluded.OM-1 small. OM-2 & OM-3 occluded.   RCA: Totally occluded in the midsection   LIMA - LAD: Patent   SVG - Ramus: Patent with 30% distal. Collateralizes LCX  SVG - D1: Patent   SVG - D2: Patent    SVG - Right acute marginal: Patent. Backfills the distal RCA through 95% ostial lesion in the native acute marginal   Assessment:  1. Severe native CAD with all bypass grafts patent  2. Low filling pressures with low cardiac output (in setting of junctional rhythm) - improved with levophed 3. Junctional rhythm with severe hypotension requiring levophed - suspect vagal episode   Plan/Discussion:  Her revascularization is stable. Filling pressures are ok but cardiac output markedly depressed in setting of transient junctional rhythm due to vagal episode in cath lab. Now improved on levophed. Will hydrate gently today and keep on levophed to support BP and renal function. Watch creatinine closely. May need repeat RHC at some point. Will discuss management of VT with EP.  Daniel Bensimhon,MD 11:06 AM

## 2013-10-04 NOTE — Care Management Note (Signed)
    Page 1 of 1   10/04/2013     10:48:51 AM CARE MANAGEMENT NOTE 10/04/2013  Patient:  [name], [name]   Account Number:  [id number]  Date Initiated:  10/04/2013  Documentation initiated by:  GRAVES-BIGELOW,Emine Lopata  Subjective/Objective Assessment:   Pt admitted for Syncope. ICD fired - plan for cath today.     Action/Plan:   No needs from CM at this time.   Anticipated DC Date:  10/05/2013   Anticipated DC Plan:  [place]  CM consult      Choice offered to / List presented to:             Status of service:  Completed, signed off Medicare Important Message given?  YES (If response is "NO", the following Medicare IM given date fields will be blank) Date Medicare IM given:  10/04/2013 Medicare IM given by:  GRAVES-BIGELOW,Clavin Ruhlman Date Additional Medicare IM given:   Additional Medicare IM given by:    Discharge Disposition:  HOME/SELF CARE  Per UR Regulation:  Reviewed for med. necessity/level of care/duration of stay  If discussed at [place] of Stay Meetings, dates discussed:    Comments:

## 2013-10-04 NOTE — H&P (View-Only) (Signed)
Advanced Heart Failure Team Consult Note  Referring Physician: Dr. Caryl Comes Primary Physician: [facility] Primary Cardiologist:  Dr. Haroldine Laws  Reason for Consultation: HF  HPI:    58 year old female with history of HTN, DM2, past polysubstance abuse (ETOH, tobacco, cocaine), CAD s/p CABG x5 with MV annuloplasty (2007), ICM s/p ICD, chronic systolic HF, severe MR, CKD stage III and PAD.   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 At time of cath LAD, LCX and RCA occluded. EF 45%. Had abrupt stent occlusion the next day and had to go back to the lab. 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   She has been followed closely in the HF clinic and was last seen 09/12/13 and at that time was doing well. She presented to the ED 8/2 with syncope s/p appropriate ICD shock. Denies any CP, increased SOB, LE edema or palpitations. Device interrogation demonstrates an episode of PMVT that regularizes at a cycle length of around 261msec terminated by HV shock. Troponin in ER was negative. With prior MI, she had significant substernal chest pressure. She has not had any of this recently.   Review of Systems: [y] = yes, [ ]  = no   General: Weight gain [ ] ; Weight loss [ ] ; Anorexia [ ] ; Fatigue [ N]; Fever [ ] ; Chills [ ] ; Weakness [ ]   Cardiac: Chest pain/pressure Aqua.Slicker ]; Resting SOB Aqua.Slicker ]; Exertional SOB [Y ]; Orthopnea [ N]; Pedal Edema Aqua.Slicker ]; Palpitations Aqua.Slicker ]; Syncope [Y ]; Presyncope [ ] ; Paroxysmal nocturnal dyspnea[ ]   Pulmonary: Cough [ ] ; Wheezing[ ] ; Hemoptysis[ ] ; Sputum [ ] ; Snoring [ ]   GI: Vomiting[ ] ; Dysphagia[ ] ; Melena[ ] ; Hematochezia [ ] ; Heartburn[ ] ; Abdominal pain [ ] ; Constipation [ ] ; Diarrhea [ ] ; BRBPR [ ]   GU: Hematuria[ ] ; Dysuria [ ] ; Nocturia[ ]   Vascular: Pain in legs with walking [ ] ; Pain in feet with lying flat [ ] ; Non-healing sores [ ] ; Stroke [ ] ; TIA [ ] ; Slurred speech [ ] ;   Neuro: Headaches[ ] ; Vertigo[ ] ; Seizures[ ] ; Paresthesias[ ] ;Blurred vision [ ] ; Diplopia [ ] ; Vision changes [ ]   Ortho/Skin: Arthritis [ ] ; Joint pain [ ] ; Muscle pain [ ] ; Joint swelling [ ] ; Back Pain [ ] ; Rash [ ]   Psych: Depression[N ]; Anxiety[ ]   Heme: Bleeding problems [ ] ; Clotting disorders [ ] ; Anemia [ ]   Endocrine: Diabetes [ ] ; Thyroid dysfunction[ ]   Home Medications Prior to Admission medications   Medication Sig Start Date End Date Taking? Authorizing Provider  allopurinol (ZYLOPRIM) 100 MG tablet Take 100-200 mg by mouth 2 (two) times daily. Takes 200 mg in the morning and 100 mg in the evening   Yes Historical Provider, MD  aspirin EC 81 MG tablet Take 81 mg by mouth daily. 09/30/13  Yes Deboraha Sprang, MD  atorvastatin (LIPITOR) 80 MG tablet Take 1 tablet (80 mg total) by mouth daily. 07/11/13  Yes Jolaine Artist, MD  carvedilol (COREG) 6.25 MG tablet Take 0.5 tablets (3.125 mg total) by mouth 2 (two) times daily with a meal. 06/02/13  Yes Larey Dresser, MD  digoxin (LANOXIN) 0.125 MG tablet Take 0.5 tablets (0.0625 mg total) by mouth every other day. 04/25/13  Yes Rande Brunt, NP  furosemide (LASIX) 40 MG tablet Take 20 mg by mouth See admin instructions. Take two tablet in the  morning, then one tablet in the evening 09/12/13  Yes Rande Brunt, NP  insulin glargine (LANTUS) 100 UNIT/ML injection Inject 38 Units into the skin at bedtime.   Yes Historical Provider, MD  insulin lispro (HUMALOG) 100 UNIT/ML injection Inject 5-8 Units into the skin 3 (three) times daily before meals.   Yes Historical Provider, MD  lisinopril (PRINIVIL,ZESTRIL) 5 MG tablet Take 2.5 mg by mouth 2 (two) times daily.    Yes Historical Provider, MD  nitroGLYCERIN (NITROSTAT) 0.4 MG SL tablet Place 1 tablet (0.4 mg total) under the tongue every 5 (five) minutes as needed. For chest pain. 06/02/12  Yes Deboraha Sprang, MD  potassium chloride SA (K-DUR,KLOR-CON) 20 MEQ tablet Take 1 tablet (20 mEq  total) by mouth daily. 06/29/13  Yes Jolaine Artist, MD  spironolactone (ALDACTONE) 25 MG tablet Take 25 mg by mouth daily.   Yes Historical Provider, MD    Past Medical History: Past Medical History  Diagnosis Date  . Coronary artery disease   . Diabetes mellitus   . Hypertension   . CHF (congestive heart failure)     Systolic.  EF 60-63%  . Polysubstance abuse     history of  (cocaine, tobacco and ETOH)    Past Surgical History: Past Surgical History  Procedure Laterality Date  . Coronary artery bypass graft    . Cardiac catheterization    . Cardiac defibrillator placement      2011, Followed By Dr. Caryl Comes  . Tee without cardioversion N/A 06/02/2013    Procedure: TRANSESOPHAGEAL ECHOCARDIOGRAM (TEE);  Surgeon: Larey Dresser, MD;  Location: [facility];  Service: Cardiovascular;  Laterality: N/A;    Family History: Family History  Problem Relation Age of Onset  . Heart attack Mother     Died age 55  . Diabetes Maternal Grandmother   . Heart attack Cousin   . Heart attack Maternal Uncle     Social History: History   Social History  . Marital Status: Widowed    Spouse Name: N/A    Number of Children: N/A  . Years of Education: N/A   Occupational History  . disable    Social History Main Topics  . Smoking status: Former Smoker    Quit date: 03/03/2011  . Smokeless tobacco: None  . Alcohol Use: Yes     Comment: Had some drinks New Years, describes social drinkingC  . Drug Use: No     Comment: Has history of cocaine use, denies recent  . Sexual Activity: None   Other Topics Concern  . None   Social History Narrative  . None    Allergies:  No Known Allergies  Objective:    Vital Signs:   Temp:  [97.8 F (36.6 C)-97.9 F (36.6 C)] 97.8 F (36.6 C) (08/03 0500) Pulse Rate:  [65-89] 85 (08/03 0810) Resp:  [13-20] 18 (08/03 0500) BP: (81-108)/(46-89) 90/46 mmHg (08/03 0810) SpO2:  [96 %-100 %] 96 % (08/03 0500) Weight:  [182 lb (82.555 kg)]  182 lb (82.555 kg) (08/02 2231)    Weight change: Filed Weights   10/02/13 1820 10/02/13 2231  Weight: 182 lb (82.555 kg) 182 lb (82.555 kg)    Intake/Output:   Intake/Output Summary (Last 24 hours) at 10/03/13 1129 Last data filed at 10/03/13 0900  Gross per 24 hour  Intake    480 ml  Output    300 ml  Net    180 ml     Physical Exam: General:  Well appearing. No resp difficulty, sitting in chair HEENT: normal Neck: supple. JVP flat . Carotids 2+ bilat; no bruits. No lymphadenopathy or thryomegaly appreciated. Cor: PMI nondisplaced. Regular rate & rhythm. No rubs, gallops or murmurs. Lungs: clear Abdomen: soft, nontender, nondistended. No hepatosplenomegaly. No bruits or masses. Good bowel sounds. Extremities: no cyanosis, clubbing, rash, edema Neuro: alert & orientedx3, cranial nerves grossly intact. moves all 4 extremities w/o difficulty. Affect pleasant  Telemetry: SR 80s  Labs: Basic Metabolic Panel:  Recent Labs Lab 10/02/13 2033 10/03/13 0520  NA 138 139  K 4.6 4.4  CL 99 102  CO2 23 24  GLUCOSE 112* 119*  BUN 52* 50*  CREATININE 1.80* 1.57*  CALCIUM 9.6 9.3  MG 2.2 2.2    Liver Function Tests:  Recent Labs Lab 10/03/13 0520  AST 19  ALT 21  ALKPHOS 77  BILITOT 0.2*  PROT 7.4  ALBUMIN 3.9   No results found for this basename: LIPASE, AMYLASE,  in the last 168 hours No results found for this basename: AMMONIA,  in the last 168 hours  CBC:  Recent Labs Lab 10/02/13 2033  WBC 7.0  HGB 11.4*  HCT 35.0*  MCV 90.9  PLT 153    Cardiac Enzymes: No results found for this basename: CKTOTAL, CKMB, CKMBINDEX, TROPONINI,  in the last 168 hours  BNP: BNP (last 3 results)  Recent Labs  11/16/12 0834 06/07/13 1041  PROBNP 181.0* 899.80*    CBG:  Recent Labs Lab 10/02/13 1904 10/03/13 0759  GLUCAP 101* 183*    Coagulation Studies: No results found for this basename: LABPROT, INR,  in the last 72 hours  Other  results:   Imaging: Ct Head Wo Contrast  10/02/2013   CLINICAL DATA:  Headache.  Fell and hit head.  EXAM: CT HEAD WITHOUT CONTRAST  CT MAXILLOFACIAL WITHOUT CONTRAST  TECHNIQUE: Multidetector CT imaging of the head and maxillofacial structures were performed using the standard protocol without intravenous contrast. Multiplanar CT image reconstructions of the maxillofacial structures were also generated.  COMPARISON:  None.  FINDINGS: CT HEAD FINDINGS  Mild chronic microvascular ischemia. Ventricle size is normal. Negative for acute infarct. Negative for hemorrhage or mass. Negative for skull fracture. Atherosclerotic disease.  CT MAXILLOFACIAL FINDINGS  Possible fracture of the right orbit which is mildly depressed with mild soft tissue swelling. This could be due to acute fracture. No fracture of the nasal bone or mandible.  Mild mucosal edema in the paranasal sinuses without air-fluid level.  IMPRESSION: No acute intracranial abnormality.  Possible mildly depressed fracture right orbital floor.   Electronically Signed   By: Franchot Gallo M.D.   On: 10/02/2013 22:41   Ct Maxillofacial Wo Cm  10/02/2013   CLINICAL DATA:  Headache.  Fell and hit head.  EXAM: CT HEAD WITHOUT CONTRAST  CT MAXILLOFACIAL WITHOUT CONTRAST  TECHNIQUE: Multidetector CT imaging of the head and maxillofacial structures were performed using the standard protocol without intravenous contrast. Multiplanar CT image reconstructions of the maxillofacial structures were also generated.  COMPARISON:  None.  FINDINGS: CT HEAD FINDINGS  Mild chronic microvascular ischemia. Ventricle size is normal. Negative for acute infarct. Negative for hemorrhage or mass. Negative for skull fracture. Atherosclerotic disease.  CT MAXILLOFACIAL FINDINGS  Possible fracture of the right orbit which is mildly depressed with mild soft tissue swelling. This could be due to acute fracture. No fracture of the nasal bone or mandible.  Mild mucosal edema in the  paranasal sinuses without air-fluid level.  IMPRESSION: No acute intracranial abnormality.  Possible mildly depressed fracture right orbital floor.   Electronically Signed   By: Franchot Gallo M.D.   On: 10/02/2013 22:41      Medications:     Current Medications: . allopurinol  300 mg Oral QHS  . aspirin EC  81 mg Oral Daily  . atorvastatin  80 mg Oral Daily  . carvedilol  3.125 mg Oral BID WC  . digoxin  0.0625 mg Oral QODAY  . furosemide  80 mg Oral Daily  . heparin  5,000 Units Subcutaneous 3 times per day  . insulin aspart  0-5 Units Subcutaneous QHS  . insulin aspart  0-9 Units Subcutaneous TID WC  . insulin aspart  5 Units Subcutaneous TID WC  . insulin glargine  38 Units Subcutaneous QHS  . lisinopril  2.5 mg Oral BID  . potassium chloride SA  20 mEq Oral Daily  . spironolactone  25 mg Oral Daily     Infusions:      Assessment:   1) Syncope - s/p appropriate ICD shock 2) Chronic systolic HF - EF 42%, severe MR (06/2013) 3) Severe MR 4) CAD s/p CABG  Plan/Discussion:    Ms. Peitz is well known to the HF team and was last seen in June and was doing well. Reports that prior to syncopal episode she got light headed and then woke up on the floor after appropriate ICD shock. She reports she has not had any CP, increased SOB, orthopnea or edema. Over the past couple months she has had a slight increase in fatigue and there was concern about low output, however on RHC her hemodynamics were normal and her repeat CPX was slightly better from previous tests.  ICD interrogated and one episode in VF zone.   EP consulted and concern that may be having some ischemia causing electrical issues so we will schedule her for [facility] tomorrow. Will hydrate overnight to try and protect renal function.   Length of Stay: 1  Rande Brunt NP-C 10/03/2013, 11:29 AM  Advanced Heart Failure Team Pager [phone] (M-F; 7a - 4p)  Please contact [facility] for night-coverage after  hours (4p -7a ) and weekends on [email or web address]  Patient seen and examined with Junie Bame, NP. We discussed all aspects of the encounter. I agree with the assessment and plan as stated above. Case d/w Dr. Caryl Comes as well. Denies frank ischemic symptoms and HF seems well controlled but ICD interrogation shows polymorphic VT concerning for ischemic-mediated VT. Will hydrate overnight gently and plan cath in am. We discussed risks of worsening renal failure and possible need for HD.   Benay Spice 4:36 PM

## 2013-10-05 DIAGNOSIS — N183 Chronic kidney disease, stage 3 unspecified: Secondary | ICD-10-CM

## 2013-10-05 LAB — GLUCOSE, CAPILLARY
Glucose-Capillary: 135 mg/dL — ABNORMAL HIGH (ref 70–99)
Glucose-Capillary: 120 mg/dL — ABNORMAL HIGH (ref 70–99)
Glucose-Capillary: 126 mg/dL — ABNORMAL HIGH (ref 70–99)
Glucose-Capillary: 145 mg/dL — ABNORMAL HIGH (ref 70–99)

## 2013-10-05 LAB — TYPE AND SCREEN
ABO/RH(D): O POS
ANTIBODY SCREEN: NEGATIVE

## 2013-10-05 LAB — COMPREHENSIVE METABOLIC PANEL
ALBUMIN: 3.6 g/dL (ref 3.5–5.2)
ALK PHOS: 72 U/L (ref 39–117)
ALT: 21 U/L (ref 0–35)
ANION GAP: 15 (ref 5–15)
AST: 19 U/L (ref 0–37)
BUN: 38 mg/dL — ABNORMAL HIGH (ref 6–23)
CHLORIDE: 103 meq/L (ref 96–112)
CO2: 23 mEq/L (ref 19–32)
CREATININE: 1.42 mg/dL — AB (ref 0.50–1.10)
Calcium: 9.1 mg/dL (ref 8.4–10.5)
GFR calc Af Amer: 47 mL/min — ABNORMAL LOW (ref >90)
GFR calc non Af Amer: 40 mL/min — ABNORMAL LOW (ref >90)
Glucose, Bld: 113 mg/dL — ABNORMAL HIGH (ref 70–99)
POTASSIUM: 4.3 meq/L (ref 3.7–5.3)
Sodium: 141 mEq/L (ref 137–147)
TOTAL PROTEIN: 6.9 g/dL (ref 6.0–8.3)
Total Bilirubin: 0.2 mg/dL — ABNORMAL LOW (ref 0.3–1.2)

## 2013-10-05 LAB — MAGNESIUM: MAGNESIUM: 2 mg/dL (ref 1.5–2.5)

## 2013-10-05 MED ORDER — AMIODARONE HCL 200 MG PO TABS
400.0000 mg | ORAL_TABLET | Freq: Two times a day (BID) | ORAL | Status: DC
  Administered 2013-10-05 – 2013-10-07 (×5): 400 mg via ORAL
  Filled 2013-10-05 (×7): qty 2

## 2013-10-05 NOTE — Progress Notes (Signed)
Advanced Heart Failure Rounding Note  Referring Physician: Dr. Caryl Comes  Primary Physician: [facility]  Primary Cardiologist: Dr. Haroldine Laws  Subjective:    58 year old female with history of HTN, DM2, past polysubstance abuse (ETOH, tobacco, cocaine), CAD s/p CABG x5 with MV annuloplasty (2007), ICM s/p ICD, chronic systolic HF, severe MR, CKD stage III and PAD.   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 At time of cath LAD, LCX and RCA occluded. EF 45%. Had abrupt stent occlusion the next day and had to go back to the lab. 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   She has been followed closely in the HF clinic and was last seen 09/12/13 and at that time was doing well. She presented to the ED 8/2 with syncope s/p appropriate ICD shock. Device interrogation demonstrates an episode of PMVT that regularizes at a cycle length of around 246msec terminated by HV shock. Troponin in ER was negative  L/RHC yesterday: RA = 5  RV = 423/2/5  PA = 25/6 (14)  PCW = 12  Fick cardiac output/index = 3.2/1.7 (duing junctional rhythm)  PVR =0.7 Woods  FA sat = 95%  PA sat = 45%, 47%  PA sat = 55% (with levophed 5 at end of case) 1) Severe CAD with all bypass grafts patent  Yesterday during cath she had vagal response and in addition to medication SBO dropped to the 40s and she went into junctional rhythm. She was started on levophed and transferred back to ICU. All medications held yesterday except her lasix. Had some ectopy with levophed and titrated down to 3 mcg/min. Denies SOB, orthopnea or CP. SBP 80-100s.    Objective:   Weight Range:  Vital Signs:   Temp:  [97.7 F (36.5 C)-98.9 F (37.2 C)] 98.7 F (37.1 C) (08/05 0753) Pulse Rate:  [34-87] 71 (08/05 0753) Resp:  [12-27] 18 (08/05 0753) BP: (80-123)/(38-95) 80/48 mmHg (08/05 0753) SpO2:  [92 %-100 %] 96 % (08/05 0753) Weight:  [182 lb  15.7 oz (83 kg)] 182 lb 15.7 oz (83 kg) (08/04 1145)    Weight change: Filed Weights   10/02/13 2231 10/04/13 0458 10/04/13 1145  Weight: 182 lb (82.555 kg) 181 lb 12.8 oz (82.464 kg) 182 lb 15.7 oz (83 kg)    Intake/Output:   Intake/Output Summary (Last 24 hours) at 10/05/13 0806 Last data filed at 10/05/13 0757  Gross per 24 hour  Intake 1925.6 ml  Output   2350 ml  Net -424.4 ml     Physical Exam: General:  Well appearing. No resp difficulty HEENT: normal, black R eye Neck: supple. JVP flat. Carotids 2+ bilat; no bruits. No lymphadenopathy or thryomegaly appreciated. Cor: PMI nondisplaced. Regular rate & rhythm. No rubs, gallops or murmurs. Lungs: clear Abdomen: soft, nontender, nondistended. No hepatosplenomegaly. No bruits or masses. Good bowel sounds. Extremities: no cyanosis, clubbing, rash, edema Neuro: alert & orientedx3, cranial nerves grossly intact. moves all 4 extremities w/o difficulty. Affect pleasant  Telemetry: SR 70s  Labs: Basic Metabolic Panel:  Recent Labs Lab 10/02/13 2033 10/03/13 0520 10/03/13 1830 10/04/13 0634 10/05/13 0237  NA 138 139 139 137 141  K 4.6 4.4 4.7 4.6 4.3  CL 99 102 101 103 103  CO2 23 24 25 22 23   GLUCOSE 112* 119* 174* 119* 113*  BUN 52* 50* 47* 40* 38*  CREATININE 1.80* 1.57* 1.60* 1.34*  1.42*  CALCIUM 9.6 9.3 9.6 9.4 9.1  MG 2.2 2.2  --  2.2 2.0    Liver Function Tests:  Recent Labs Lab 10/03/13 0520 10/04/13 0634 10/05/13 0237  AST 19 17 19   ALT 21 19 21   ALKPHOS 77 76 72  BILITOT 0.2* 0.2* 0.2*  PROT 7.4 7.4 6.9  ALBUMIN 3.9 3.9 3.6   No results found for this basename: LIPASE, AMYLASE,  in the last 168 hours No results found for this basename: AMMONIA,  in the last 168 hours  CBC:  Recent Labs Lab 10/02/13 2033  WBC 7.0  HGB 11.4*  HCT 35.0*  MCV 90.9  PLT 153    Cardiac Enzymes: No results found for this basename: CKTOTAL, CKMB, CKMBINDEX, TROPONINI,  in the last 168 hours  BNP: BNP  (last 3 results)  Recent Labs  11/16/12 0834 06/07/13 1041  PROBNP 181.0* 899.80*     Imaging:  No results found.   Medications:     Scheduled Medications: . allopurinol  300 mg Oral QHS  . aspirin EC  81 mg Oral Daily  . atorvastatin  80 mg Oral Daily  . carvedilol  3.125 mg Oral BID WC  . digoxin  0.0625 mg Oral QODAY  . furosemide  80 mg Oral Daily  . heparin  5,000 Units Subcutaneous 3 times per day  . insulin aspart  0-5 Units Subcutaneous QHS  . insulin aspart  0-9 Units Subcutaneous TID WC  . insulin aspart  5 Units Subcutaneous TID WC  . insulin glargine  38 Units Subcutaneous QHS  . lisinopril  2.5 mg Oral BID  . pneumococcal 23 valent vaccine  0.5 mL Intramuscular Tomorrow-1000  . potassium chloride SA  20 mEq Oral Daily  . spironolactone  25 mg Oral Daily     Infusions: . sodium chloride Stopped (10/04/13 1145)  . norepinephrine (LEVOPHED) Adult infusion 3 mcg/min (10/04/13 2000)     PRN Medications:  sodium chloride, acetaminophen, acetaminophen, diphenhydrAMINE, ondansetron (ZOFRAN) IV   Assessment:   1) Syncope  - s/p appropriate ICD shock  2) Chronic systolic HF  - EF 47%, severe MR (06/2013)  3) Severe MR  4) CAD s/p CABG 5) Vagal episode, severe hypotension 6) Vtach  Plan/Discussion:    Ms. Gowen went for [facility] yesterday and during the case ended up having a vagal episode and dropped her pressures into the 40s and she went into a junctional rhythm in the 40s. Co-ox's during her case were 45 and 47%. She was started on levophed and transferred to ICU.  SBP remains at her baseline 90-100s. Will stop levophed and eventually will try to get her back on her low dose coreg and lisinopril.   Will need to discuss with EP about management for VT. She is still having frequent runs of Vtach. Will talk to them about starting amiodarone.   If we are able to get levophed off today can transfer to floor.   Will probably need repeat RHC before  discharge home to reassess outputs. Last CPX test was peak VO2 15.6 (predicted peak VO2 74.5%) VE/VO2 40.8, RER 1.2 which was improved from last test.   Length of Stay: 3 Rande Brunt NP-C 10/05/2013, 8:06 AM  Advanced Heart Failure Team Pager [phone] (M-F; 7a - 4p)  Please contact [facility] for night-coverage after hours (4p -7a ) and weekends on [email or web address]  Patient seen and examined with Junie Bame, NP. We discussed all aspects of the encounter. I agree  with the assessment and plan as stated above.   Cath results reviewed. Coronary anatomy stable. Had vagal episode in cath lab with shock. Back to SR now. Renal function stable. Continues to have runs NSVT. She is very nervious about repeat ICD shock. Will load amiodarone.   She is off levophed now and SBP 79. Will transfer to SDU. Attempt to restart po meds carefully - wlll hold lasix, spiro and lisinopril today. Very low threshold to repeat RHC as outpatient and pursue transplant w/u.  Pasco Marchitto,MD 9:02 AM

## 2013-10-06 ENCOUNTER — Inpatient Hospital Stay (HOSPITAL_COMMUNITY): Payer: Medicare Other

## 2013-10-06 DIAGNOSIS — Z01810 Encounter for preprocedural cardiovascular examination: Secondary | ICD-10-CM

## 2013-10-06 LAB — COMPREHENSIVE METABOLIC PANEL
ALT: 21 U/L (ref 0–35)
ANION GAP: 11 (ref 5–15)
AST: 16 U/L (ref 0–37)
Albumin: 3.6 g/dL (ref 3.5–5.2)
Alkaline Phosphatase: 73 U/L (ref 39–117)
BUN: 35 mg/dL — AB (ref 6–23)
CALCIUM: 9.4 mg/dL (ref 8.4–10.5)
CO2: 26 meq/L (ref 19–32)
CREATININE: 1.37 mg/dL — AB (ref 0.50–1.10)
Chloride: 103 mEq/L (ref 96–112)
GFR, EST AFRICAN AMERICAN: 49 mL/min — AB (ref >90)
GFR, EST NON AFRICAN AMERICAN: 42 mL/min — AB (ref >90)
GLUCOSE: 175 mg/dL — AB (ref 70–99)
Potassium: 4.4 mEq/L (ref 3.7–5.3)
SODIUM: 140 meq/L (ref 137–147)
TOTAL PROTEIN: 6.8 g/dL (ref 6.0–8.3)
Total Bilirubin: <0.2 mg/dL — ABNORMAL LOW (ref 0.3–1.2)

## 2013-10-06 LAB — GLUCOSE, CAPILLARY
GLUCOSE-CAPILLARY: 96 mg/dL (ref 70–99)
GLUCOSE-CAPILLARY: 130 mg/dL — AB (ref 70–99)
GLUCOSE-CAPILLARY: 106 mg/dL — AB (ref 70–99)
GLUCOSE-CAPILLARY: 163 mg/dL — AB (ref 70–99)

## 2013-10-06 LAB — DIGOXIN LEVEL

## 2013-10-06 LAB — MAGNESIUM: Magnesium: 2.1 mg/dL (ref 1.5–2.5)

## 2013-10-06 MED ORDER — CARVEDILOL 3.125 MG PO TABS
3.1250 mg | ORAL_TABLET | Freq: Two times a day (BID) | ORAL | Status: DC
  Filled 2013-10-06 (×3): qty 1

## 2013-10-06 NOTE — Progress Notes (Signed)
CARDIAC REHAB PHASE I   PRE:  Rate/Rhythm: 79 SR  BP:  Supine: 87/48  Sitting:   Standing:    SaO2: 96 RA  MODE:  Ambulation: 700 ft   POST:  Rate/Rhythm: 99 SR  BP:  Supine: 73/44 Rechecked 123/49 Sitting:   Standing:    SaO2: 98 RA 1340-1430 On arrival pt in bed talking with her sister. BP lying in bed 87/48. Assisted X 1 to ambulate. Gait steady. Pt denies anym chest pain, SOB or dizziness with walking.BP after walk 73/44. She continued to deny any dizziness. After 3-4 minutes rechecked BP 123/49. She did c/o of left hip pain with walking and states that she has been having left hip pain at home walking. Pt said that she had a xray of her hip and that it was normal. She has Sports Medicine's telephone number and is suppose to call for an appointment. It sounds like the pain really has been limiting her activity level. Pt voices understanding of CHF, zones, fluid and sodium restrictions. She states that she does not weigh herself daily, but watches closely for any swelling. I encouraged her to walk, but the hip pain has slowed her down. Rodney Langton RN 10/06/2013 2:18 PM

## 2013-10-06 NOTE — Progress Notes (Signed)
Advanced Heart Failure Rounding Note  Referring Physician: Dr. Caryl Comes  Primary Physician: [facility]  Primary Cardiologist: Dr. Haroldine Laws  Subjective:    58 year old female with history of HTN, DM2, past polysubstance abuse (ETOH, tobacco, cocaine), CAD s/p CABG x5 with MV annuloplasty (2007), ICM s/p ICD, chronic systolic HF, severe MR, CKD stage III and PAD.   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 At time of cath LAD, LCX and RCA occluded. EF 45%. Had abrupt stent occlusion the next day and had to go back to the lab. 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   She has been followed closely in the HF clinic and was last seen 09/12/13 and at that time was doing well. She presented to the ED 8/2 with syncope s/p appropriate ICD shock. Device interrogation demonstrates an episode of PMVT that regularizes at a cycle length of around 257msec terminated by HV shock. Troponin in ER was negative  L/RHC yesterday: RA = 5  RV = 423/2/5  PA = 25/6 (14)  PCW = 12  Fick cardiac output/index = 3.2/1.7 (duing junctional rhythm)  PVR =0.7 Woods  FA sat = 95%  PA sat = 45%, 47%  PA sat = 55% (with levophed 5 at end of case) 1) Severe CAD with all bypass grafts patent  During cath 8/4 she had vagal response and in addition to medication SBP dropped to the 40s and she went into junctional rhythm. She was started on levophed and transferred back to ICU.   Stable overnight. Levophed discontinued and all HF meds held. Started on Amiodarone yesterday for NSVT. Renal function stable. Denies SOB, CP or orthopnea. Met with VAD Coordinator yesterday and will begin tx and VAD work-up.    Objective:   Weight Range:  Vital Signs:   Temp:  [98 F (36.7 C)-98.7 F (37.1 C)] 98.2 F (36.8 C) (08/05 2345) Pulse Rate:  [71-96] 79 (08/05 1116) Resp:  [18-26] 19 (08/05 1100) BP: (79-104)/(45-64) 91/50 mmHg  (08/05 2345) SpO2:  [94 %-100 %] 94 % (08/05 2345)    Weight change: Filed Weights   10/02/13 2231 10/04/13 0458 10/04/13 1145  Weight: 182 lb (82.555 kg) 181 lb 12.8 oz (82.464 kg) 182 lb 15.7 oz (83 kg)    Intake/Output:   Intake/Output Summary (Last 24 hours) at 10/06/13 0711 Last data filed at 10/05/13 1800  Gross per 24 hour  Intake    840 ml  Output   1150 ml  Net   -310 ml     Physical Exam: General:  Well appearing. No resp difficulty HEENT: normal, black R eye Neck: supple. JVP flat. Carotids 2+ bilat; no bruits. No lymphadenopathy or thryomegaly appreciated. Cor: PMI nondisplaced. Regular rate & rhythm. No rubs, gallops or murmurs. Lungs: clear Abdomen: soft, nontender, nondistended. No hepatosplenomegaly. No bruits or masses. Good bowel sounds. Extremities: no cyanosis, clubbing, rash, edema Neuro: alert & orientedx3, cranial nerves grossly intact. moves all 4 extremities w/o difficulty. Affect pleasant  Telemetry: SR 70s  Labs: Basic Metabolic Panel:  Recent Labs Lab 10/02/13 2033 10/03/13 0520 10/03/13 1830 10/04/13 0634 10/05/13 0237 10/06/13 0332  NA 138 139 139 137 141 140  K 4.6 4.4 4.7 4.6 4.3 4.4  CL 99 102 101 103 103 103  CO2 $Re'23 24 25 22 23 26  'NtJ$ GLUCOSE 112* 119* 174* 119* 113* 175*  BUN 52* 50* 47*  40* 38* 35*  CREATININE 1.80* 1.57* 1.60* 1.34* 1.42* 1.37*  CALCIUM 9.6 9.3 9.6 9.4 9.1 9.4  MG 2.2 2.2  --  2.2 2.0 2.1    Liver Function Tests:  Recent Labs Lab 10/03/13 0520 10/04/13 0634 10/05/13 0237 10/06/13 0332  AST $Re'19 17 19 16  'vwm$ ALT $R'21 19 21 21  'sh$ ALKPHOS 77 76 72 73  BILITOT 0.2* 0.2* 0.2* <0.2*  PROT 7.4 7.4 6.9 6.8  ALBUMIN 3.9 3.9 3.6 3.6   No results found for this basename: LIPASE, AMYLASE,  in the last 168 hours No results found for this basename: AMMONIA,  in the last 168 hours  CBC:  Recent Labs Lab 10/02/13 2033  WBC 7.0  HGB 11.4*  HCT 35.0*  MCV 90.9  PLT 153    Cardiac Enzymes: No results found for  this basename: CKTOTAL, CKMB, CKMBINDEX, TROPONINI,  in the last 168 hours  BNP: BNP (last 3 results)  Recent Labs  11/16/12 0834 06/07/13 1041  PROBNP 181.0* 899.80*     Imaging: No results found.   Medications:     Scheduled Medications: . allopurinol  300 mg Oral QHS  . amiodarone  400 mg Oral BID  . aspirin EC  81 mg Oral Daily  . atorvastatin  80 mg Oral Daily  . digoxin  0.0625 mg Oral QODAY  . heparin  5,000 Units Subcutaneous 3 times per day  . insulin aspart  0-5 Units Subcutaneous QHS  . insulin aspart  0-9 Units Subcutaneous TID WC  . insulin aspart  5 Units Subcutaneous TID WC  . insulin glargine  38 Units Subcutaneous QHS  . pneumococcal 23 valent vaccine  0.5 mL Intramuscular Tomorrow-1000    Infusions: . sodium chloride Stopped (10/04/13 1145)    PRN Medications: sodium chloride, acetaminophen, acetaminophen, diphenhydrAMINE, ondansetron (ZOFRAN) IV   Assessment:   1) Syncope  - s/p appropriate ICD shock  2) Chronic systolic HF  - EF 60%, severe MR (06/2013)  3) Severe MR  4) CAD s/p CABG 5) Vagal episode, severe hypotension 6) Vtach  Plan/Discussion:    Ms. Bostic went for [facility] 8/4 and during the case ended up having a vagal episode and dropped her pressures into the 40s and she went into a junctional rhythm in the 40s. Co-ox's during her case were 45 and 47%. She was started on levophed and transferred to ICU.  Levophed discontinued and all HF meds held yesterday. SBP remains soft 80-90s which is normal for her. Will continue to hold off on ACE-I with recent dye exposure. Her HR is dropping some in to the upper 40-50s will continue to hold off on BB.  Started on Amiodarone 400 mg PO BID yesterday for NSVT and less NSVT.  Patient met with VAD Coordinator yesterday and will meet with her and her sister again today to discuss work-up process and show her the pump. Will order some of the pre-work up tests while she is in the hospital. She is O+  and likely will have a long wait on tx list. We will have low threshold on OP side to repeat RHC if any change in symptoms.   Pending transfer to step-down, hopefully home tomorrow.    Length of Stay: 4 Rande Brunt NP-C 10/06/2013, 7:11 AM  Advanced Heart Failure Team Pager [phone] (M-F; 7a - 4p)  Please contact [facility] for night-coverage after hours (4p -7a ) and weekends on [email or web address]  Patient seen and examined with Junie Bame, NP.  We discussed all aspects of the encounter. I agree with the assessment and plan as stated above.   Loading amio for VT. BP soft but asymptomatic. Renal function stable. Agree with holding ACE/b-blocker one more days. Work-up for advanced therapies has begun. Consider restarting ACE in am. Transfer to SDU. Home in 24-48 hours.   Will likely need repeat RHC as outpatient in near future.   Quillian Quince Radwan Cowley,MD 12:27 PM

## 2013-10-06 NOTE — Progress Notes (Addendum)
VASCULAR LAB PRELIMINARY  PRELIMINARY  PRELIMINARY  PRELIMINARY  Bilateral lower extremity venous Dopplers completed.    Preliminary report:  There is no DVT or SVT noted in the bilateral lower extremities.     KANADY, CANDACE, RVT 10/06/2013, 2:35 PM     VASCULAR LAB PRELIMINARY  ARTERIAL  ABI completed:    RIGHT    LEFT    PRESSURE WAVEFORM  PRESSURE WAVEFORM  BRACHIAL 103 Triphasic BRACHIAL Unable to obtain due to IV placement. Triphasic  DP 77 Triphasic DP 131 Triphasic  AT   AT    PT 62 Monophasic PT 120 Biphasic  PER   PER    GREAT TOE  NA GREAT TOE  NA    RIGHT LEFT  ABI 0.75 1.27   The right ABI is suggestive of moderate arterial insufficiency. The left ABI is slightly elevated, suggestive of calcified vessels.  10/06/2013 5:34 PM Maudry Mayhew, RVT, RDCS, RDMS

## 2013-10-07 ENCOUNTER — Encounter (HOSPITAL_COMMUNITY): Payer: Self-pay | Admitting: Anesthesiology

## 2013-10-07 ENCOUNTER — Encounter: Payer: Self-pay | Admitting: Internal Medicine

## 2013-10-07 LAB — TSH: TSH: 5.92 u[IU]/mL — AB (ref 0.350–4.500)

## 2013-10-07 LAB — HEPATITIS B SURFACE ANTIGEN: Hepatitis B Surface Ag: NEGATIVE

## 2013-10-07 LAB — HIV ANTIBODY (ROUTINE TESTING W REFLEX): HIV 1&2 Ab, 4th Generation: NONREACTIVE

## 2013-10-07 LAB — LACTATE DEHYDROGENASE: LDH: 166 U/L (ref 94–250)

## 2013-10-07 LAB — PREALBUMIN: PREALBUMIN: 29.5 mg/dL (ref 17.0–34.0)

## 2013-10-07 LAB — GLUCOSE, CAPILLARY: GLUCOSE-CAPILLARY: 112 mg/dL — AB (ref 70–99)

## 2013-10-07 LAB — BASIC METABOLIC PANEL
Anion gap: 13 (ref 5–15)
BUN: 31 mg/dL — ABNORMAL HIGH (ref 6–23)
CO2: 25 meq/L (ref 19–32)
CREATININE: 1.38 mg/dL — AB (ref 0.50–1.10)
Calcium: 9.5 mg/dL (ref 8.4–10.5)
Chloride: 103 mEq/L (ref 96–112)
GFR calc Af Amer: 49 mL/min — ABNORMAL LOW (ref >90)
GFR calc non Af Amer: 42 mL/min — ABNORMAL LOW (ref >90)
Glucose, Bld: 93 mg/dL (ref 70–99)
Potassium: 4.6 mEq/L (ref 3.7–5.3)
Sodium: 141 mEq/L (ref 137–147)

## 2013-10-07 LAB — HEPATITIS B SURFACE ANTIBODY,QUALITATIVE: Hep B S Ab: NEGATIVE

## 2013-10-07 LAB — HEPATITIS B CORE ANTIBODY, IGM: Hep B C IgM: NONREACTIVE

## 2013-10-07 LAB — HEMOGLOBIN A1C
HEMOGLOBIN A1C: 6.7 % — AB (ref <5.7)
Mean Plasma Glucose: 146 mg/dL — ABNORMAL HIGH (ref <117)

## 2013-10-07 LAB — HEPATITIS C ANTIBODY: HCV Ab: NEGATIVE

## 2013-10-07 LAB — ANTITHROMBIN III: ANTITHROMB III FUNC: 116 % (ref 75–120)

## 2013-10-07 LAB — URIC ACID: Uric Acid, Serum: 3.5 mg/dL (ref 2.4–7.0)

## 2013-10-07 LAB — MAGNESIUM: Magnesium: 2 mg/dL (ref 1.5–2.5)

## 2013-10-07 MED ORDER — FUROSEMIDE 40 MG PO TABS: ORAL_TABLET | ORAL | Status: DC

## 2013-10-07 MED ORDER — SPIRONOLACTONE 25 MG PO TABS: 12.5000 mg | ORAL_TABLET | Freq: Every day | ORAL | Status: DC

## 2013-10-07 MED ORDER — AMIODARONE HCL 400 MG PO TABS: 400.0000 mg | ORAL_TABLET | Freq: Two times a day (BID) | ORAL | Status: DC

## 2013-10-07 MED ORDER — LISINOPRIL 5 MG PO TABS: 2.5000 mg | ORAL_TABLET | Freq: Every day | ORAL | Status: DC

## 2013-10-07 NOTE — Progress Notes (Signed)
Advanced Heart Failure Rounding Note  Referring Physician: Dr. Caryl Comes  Primary Physician: [facility]  Primary Cardiologist: Dr. Haroldine Laws  Subjective:    58 year old female with history of HTN, DM2, past polysubstance abuse (ETOH, tobacco, cocaine), CAD s/p CABG x5 with MV annuloplasty (2007), ICM s/p ICD, chronic systolic HF, severe MR, CKD stage III and PAD.   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 At time of cath LAD, LCX and RCA occluded. EF 45%. Had abrupt stent occlusion the next day and had to go back to the lab. 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   She has been followed closely in the HF clinic and was last seen 09/12/13 and at that time was doing well. She presented to the ED 8/2 with syncope s/p appropriate ICD shock. Device interrogation demonstrates an episode of PMVT that regularizes at a cycle length of around 228msec terminated by HV shock. Troponin in ER was negative  L/RHC yesterday: RA = 5  RV = 423/2/5  PA = 25/6 (14)  PCW = 12  Fick cardiac output/index = 3.2/1.7 (duing junctional rhythm)  PVR =0.7 Woods  FA sat = 95%  PA sat = 45%, 47%  PA sat = 55% (with levophed 5 at end of case) 1) Severe CAD with all bypass grafts patent  During cath 8/4 she had vagal response and in addition to medication SBP dropped to the 40s and she went into junctional rhythm. She was started on levophed and transferred back to ICU.   Stable overnight. Lengthy discussion with sister and patient yesterday about LVAD/tx. Renal function stable. Denies SOB, CP or orthopnea.   Objective:   Weight Range:  Vital Signs:   Temp:  [98 F (36.7 C)-98.7 F (37.1 C)] 98.2 F (36.8 C) (08/07 0821) Pulse Rate:  [71-86] 82 (08/07 0821) Resp:  [20] 20 (08/07 0324) BP: (87-103)/(43-72) 102/62 mmHg (08/07 0821) SpO2:  [92 %-100 %] 92 % (08/07 0821) Weight:  [182 lb 15.7 oz (83 kg)] 182 lb  15.7 oz (83 kg) (08/07 0324) Last BM Date: 10/06/13  Weight change: Filed Weights   10/04/13 0458 10/04/13 1145 10/07/13 0324  Weight: 181 lb 12.8 oz (82.464 kg) 182 lb 15.7 oz (83 kg) 182 lb 15.7 oz (83 kg)    Intake/Output:   Intake/Output Summary (Last 24 hours) at 10/07/13 0845 Last data filed at 10/06/13 2300  Gross per 24 hour  Intake    715 ml  Output      0 ml  Net    715 ml     Physical Exam: General:  Well appearing. No resp difficulty HEENT: normal, black R eye Neck: supple. JVP flat. Carotids 2+ bilat; no bruits. No lymphadenopathy or thryomegaly appreciated. Cor: PMI nondisplaced. Regular rate & rhythm. No rubs, gallops or murmurs. Lungs: clear Abdomen: soft, nontender, nondistended. No hepatosplenomegaly. No bruits or masses. Good bowel sounds. Extremities: no cyanosis, clubbing, rash, edema Neuro: alert & orientedx3, cranial nerves grossly intact. moves all 4 extremities w/o difficulty. Affect pleasant  Telemetry: SR 80s  Labs: Basic Metabolic Panel:  Recent Labs Lab 10/03/13 0520 10/03/13 1830 10/04/13 0634 10/05/13 0237 10/06/13 0332 10/07/13 0246  NA 139 139 137 141 140 141  K 4.4 4.7 4.6 4.3 4.4 4.6  CL 102 101 103 103 103 103  CO2 24 25 22 23 26 25   GLUCOSE 119* 174* 119* 113*  175* 93  BUN 50* 47* 40* 38* 35* 31*  CREATININE 1.57* 1.60* 1.34* 1.42* 1.37* 1.38*  CALCIUM 9.3 9.6 9.4 9.1 9.4 9.5  MG 2.2  --  2.2 2.0 2.1 2.0    Liver Function Tests:  Recent Labs Lab 10/03/13 0520 10/04/13 0634 10/05/13 0237 10/06/13 0332  AST 19 17 19 16   ALT 21 19 21 21   ALKPHOS 77 76 72 73  BILITOT 0.2* 0.2* 0.2* <0.2*  PROT 7.4 7.4 6.9 6.8  ALBUMIN 3.9 3.9 3.6 3.6   No results found for this basename: LIPASE, AMYLASE,  in the last 168 hours No results found for this basename: AMMONIA,  in the last 168 hours  CBC:  Recent Labs Lab 10/02/13 2033  WBC 7.0  HGB 11.4*  HCT 35.0*  MCV 90.9  PLT 153    Cardiac Enzymes: No results found  for this basename: CKTOTAL, CKMB, CKMBINDEX, TROPONINI,  in the last 168 hours  BNP: BNP (last 3 results)  Recent Labs  11/16/12 0834 06/07/13 1041  PROBNP 181.0* 899.80*     Imaging: Ct Abdomen Pelvis Wo Contrast  10/07/2013   CLINICAL DATA:  Evaluation prior to potential ventricular assist device (VAD) placement.  EXAM: CT CHEST, ABDOMEN AND PELVIS WITHOUT CONTRAST  TECHNIQUE: Multidetector CT imaging of the chest, abdomen and pelvis was performed following the standard protocol without IV contrast.  COMPARISON:  CT of the abdomen and pelvis 11/07/2007. CT of the chest 12/13/2005.  FINDINGS: CT CHEST FINDINGS  Mediastinum: Heart size is mildly enlarged. There is no significant pericardial fluid, thickening or pericardial calcification. There is atherosclerosis of the thoracic aorta, the great vessels of the mediastinum and the coronary arteries, including calcified atherosclerotic plaque in the left main, left anterior descending, left circumflex and right coronary arteries. Status post median sternotomy for CABG, including LIMA to the LAD. On image 18 of series 201 the LIMA graft comes in closest proximity to the overlying sternotomy, approximately 1.6 cm deep and 2.1 cm to the left of midline. There is also a mitral annuloplasty ring noted. The apex of the left ventricle is deep to the anterior aspects of the left fourth through fifth ribs. Left-sided pacemaker/AICD with lead tip terminating in the right ventricular apex. No pathologically enlarged mediastinal or hilar lymph nodes. Please note that accurate exclusion of hilar adenopathy is limited on noncontrast CT scans. Small hiatal hernia.  Lungs/Pleura: In the apex of the right upper lobe posteriorly there is a 10 x 9 mm ground-glass attenuation nodule with irregular margins (image 15 of series 202). Several other tiny nodules are seen scattered throughout the lungs bilaterally, largest of which measures only 3 mm in the subpleural aspect of the  lingula (image 28 of series 202). No acute consolidative airspace disease. No pleural effusions. There is a background of very mild ground-glass attenuation and mild interlobular septal thickening, suggesting some mild interstitial pulmonary edema.  Musculoskeletal: Median sternotomy wires. There are no aggressive appearing lytic or blastic lesions noted in the visualized portions of the skeleton.  CT ABDOMEN AND PELVIS FINDINGS  Abdomen/Pelvis: Small calcified gallstones are noted within the gallbladder which is nearly completely contracted. No findings to suggest acute cholecystitis at this time. The unenhanced appearance of the liver, pancreas, spleen, bilateral adrenal glands in the left kidney is unremarkable. There is a 2 mm calcification in the interpolar region of the right kidney which may represent a vascular calcification or a tiny nonobstructive calculus. Extensive atherosclerosis throughout the abdominal and pelvic  vasculature, without evidence of aneurysm. No significant volume of ascites. No pneumoperitoneum. No pathologic distention of small bowel. Normal appendix. Uterus and ovaries are atrophic. Urinary bladder is normal in appearance. No definite lymphadenopathy identified in the abdomen or pelvis on today's non contrast CT examination.  Musculoskeletal: Multiple areas of fluid attenuation and tiny locules of gas in the subcutaneous fat of the lower abdomen, presumably iatrogenic. There are no aggressive appearing lytic or blastic lesions noted in the visualized portions of the skeleton.  IMPRESSION: 1. Cardiomegaly with evidence of mild interstitial pulmonary edema, suggesting mild congestive heart failure. Findings and measurements pertinent to potential VAD placement, as above. 2. 10 x 9 mm ground-glass attenuation nodule with irregular margins in the apex of the right upper lobe. This is nonspecific, and could certainly be infectious or inflammatory, or could be related to underlying edema.  However, attention on followup studies is recommended. Additionally, there are several other tiny pulmonary nodules. Initial follow-up by chest CT without contrast is recommended in 3 months to confirm persistence. This recommendation follows the consensus statement: Recommendations for the Management of Subsolid Pulmonary Nodules Detected at CT: A Statement from the [place] as published in Radiology 2013; 266:304-317. 3. Cholelithiasis without evidence to suggest acute cholecystitis at this time. 4. 2 mm calcification in the interpolar region of the right kidney may represent a vascular calcification or a tiny nonobstructive calculus. 5. Additional findings, as above.   Electronically Signed   By: Vinnie Langton M.D.   On: 10/07/2013 08:31   Ct Chest Wo Contrast  10/07/2013   CLINICAL DATA:  Evaluation prior to potential ventricular assist device (VAD) placement.  EXAM: CT CHEST, ABDOMEN AND PELVIS WITHOUT CONTRAST  TECHNIQUE: Multidetector CT imaging of the chest, abdomen and pelvis was performed following the standard protocol without IV contrast.  COMPARISON:  CT of the abdomen and pelvis 11/07/2007. CT of the chest 12/13/2005.  FINDINGS: CT CHEST FINDINGS  Mediastinum: Heart size is mildly enlarged. There is no significant pericardial fluid, thickening or pericardial calcification. There is atherosclerosis of the thoracic aorta, the great vessels of the mediastinum and the coronary arteries, including calcified atherosclerotic plaque in the left main, left anterior descending, left circumflex and right coronary arteries. Status post median sternotomy for CABG, including LIMA to the LAD. On image 18 of series 201 the LIMA graft comes in closest proximity to the overlying sternotomy, approximately 1.6 cm deep and 2.1 cm to the left of midline. There is also a mitral annuloplasty ring noted. The apex of the left ventricle is deep to the anterior aspects of the left fourth through fifth ribs.  Left-sided pacemaker/AICD with lead tip terminating in the right ventricular apex. No pathologically enlarged mediastinal or hilar lymph nodes. Please note that accurate exclusion of hilar adenopathy is limited on noncontrast CT scans. Small hiatal hernia.  Lungs/Pleura: In the apex of the right upper lobe posteriorly there is a 10 x 9 mm ground-glass attenuation nodule with irregular margins (image 15 of series 202). Several other tiny nodules are seen scattered throughout the lungs bilaterally, largest of which measures only 3 mm in the subpleural aspect of the lingula (image 28 of series 202). No acute consolidative airspace disease. No pleural effusions. There is a background of very mild ground-glass attenuation and mild interlobular septal thickening, suggesting some mild interstitial pulmonary edema.  Musculoskeletal: Median sternotomy wires. There are no aggressive appearing lytic or blastic lesions noted in the visualized portions of the skeleton.  CT ABDOMEN AND  PELVIS FINDINGS  Abdomen/Pelvis: Small calcified gallstones are noted within the gallbladder which is nearly completely contracted. No findings to suggest acute cholecystitis at this time. The unenhanced appearance of the liver, pancreas, spleen, bilateral adrenal glands in the left kidney is unremarkable. There is a 2 mm calcification in the interpolar region of the right kidney which may represent a vascular calcification or a tiny nonobstructive calculus. Extensive atherosclerosis throughout the abdominal and pelvic vasculature, without evidence of aneurysm. No significant volume of ascites. No pneumoperitoneum. No pathologic distention of small bowel. Normal appendix. Uterus and ovaries are atrophic. Urinary bladder is normal in appearance. No definite lymphadenopathy identified in the abdomen or pelvis on today's non contrast CT examination.  Musculoskeletal: Multiple areas of fluid attenuation and tiny locules of gas in the subcutaneous fat  of the lower abdomen, presumably iatrogenic. There are no aggressive appearing lytic or blastic lesions noted in the visualized portions of the skeleton.  IMPRESSION: 1. Cardiomegaly with evidence of mild interstitial pulmonary edema, suggesting mild congestive heart failure. Findings and measurements pertinent to potential VAD placement, as above. 2. 10 x 9 mm ground-glass attenuation nodule with irregular margins in the apex of the right upper lobe. This is nonspecific, and could certainly be infectious or inflammatory, or could be related to underlying edema. However, attention on followup studies is recommended. Additionally, there are several other tiny pulmonary nodules. Initial follow-up by chest CT without contrast is recommended in 3 months to confirm persistence. This recommendation follows the consensus statement: Recommendations for the Management of Subsolid Pulmonary Nodules Detected at CT: A Statement from the [place] as published in Radiology 2013; 266:304-317. 3. Cholelithiasis without evidence to suggest acute cholecystitis at this time. 4. 2 mm calcification in the interpolar region of the right kidney may represent a vascular calcification or a tiny nonobstructive calculus. 5. Additional findings, as above.   Electronically Signed   By: Vinnie Langton M.D.   On: 10/07/2013 08:31     Medications:     Scheduled Medications: . allopurinol  300 mg Oral QHS  . amiodarone  400 mg Oral BID  . aspirin EC  81 mg Oral Daily  . atorvastatin  80 mg Oral Daily  . digoxin  0.0625 mg Oral QODAY  . heparin  5,000 Units Subcutaneous 3 times per day  . insulin aspart  0-5 Units Subcutaneous QHS  . insulin aspart  0-9 Units Subcutaneous TID WC  . insulin aspart  5 Units Subcutaneous TID WC  . insulin glargine  38 Units Subcutaneous QHS  . pneumococcal 23 valent vaccine  0.5 mL Intramuscular Tomorrow-1000    Infusions:    PRN Medications: sodium chloride, acetaminophen,  diphenhydrAMINE, ondansetron (ZOFRAN) IV   Assessment:   1) Syncope  - s/p appropriate ICD shock  2) Chronic systolic HF  - EF 32%, severe MR (06/2013)  3) Severe MR  4) CAD s/p CABG 5) Vagal episode, severe hypotension 6) Vtach  Plan/Discussion:    Ms. Wiersma went for [facility] 8/4 and during the case ended up having a vagal episode and dropped her pressures into the 40s and she went into a junctional rhythm in the 40s. Co-ox's during her case were 45 and 47%. She was started on levophed and transferred to ICU.  SBP 90-100s. Will start low dose lisinopril 2.5 mg daily at night. Will not start BB back and will keep off spironolactone.   Continue amiodarone 400 mg PO BID.   Can go home today with close follow  up in HF clinic next week. Will start working on getting her an appointment with [facility] for heart transplant work-up.  Discussion with patient that if she has any increased SOB or fatigue she needs to call us. Low threshold for repeat RHC.  Length of Stay: 5 Rande Brunt NP-C 10/07/2013, 8:45 AM  Advanced Heart Failure Team Pager [phone] (M-F; 7a - 4p)  Please contact [facility] for night-coverage after hours (4p -7a ) and weekends on [email or web address]  Patient seen and examined with Junie Bame, NP. We discussed all aspects of the encounter. I agree with the assessment and plan as stated above. She is improved. Can d/c home today with close f/u. Refer to [facility] for transplant w/u. Consider VAD or milrinone as bridge, if needed.  Benay Spice 9:47 PM

## 2013-10-07 NOTE — Discharge Summary (Signed)
Advanced Heart Failure Team  Discharge Summary   Patient ID: [name] MRN: [id number], DOB/AGE: June 29, 1955 58 y.o. Admit date: 10/02/2013 D/C date:     10/07/2013   Referring Physician: Dr. Caryl Comes  Primary Physician: [facility]  Primary Cardiologist: Dr. Haroldine Laws  Primary Discharge Diagnoses:  1) Syncope  - s/p appropriate ICD shock  Secondary Discharge Diagnoses:  1) Chronic systolic HF - EF 40%, severe MR (06/2013) 2) Severe MR 3) CAD - s/p CABG x5 4) Vagal episode, severe hypotension 5) Vtach 6) NSVT 7) CKD stage III  Hospital Course:  [name] is a 58 year old female with history of HTN, DM2, past polysubstance abuse (ETOH, tobacco, cocaine), CAD s/p CABG x5 with MV annuloplasty (2007), ICM s/p ICD, chronic systolic HF, severe MR, CKD stage III and PAD.   She has been followed closely in the HF clinic and has been doing well. She presented to the ED 8/2 with syncope s/p appropriate ICD shock. Prior to the shock she denied any CP, increased SOB, LE edema or palpitations. Device interrogation demonstrated an episode of PMVT that regularized at a cycle length of around 256msec terminated by HV shock. Her Troponin in ER was negative, creatinine 1.8, K+4.6 and Hgb 11.4. EP was consulted and were concerned for possible ischemia so it was decided to take her for [facility] on 10/04/13. During her cath she had a vagal episode in combination with her medications and her SBP dropped into the 40s and her PA sat was in the upper 40s. She went into a junctional rhythm as well and levophed was started at 5 mcg/kg/min and her SBP responded. All of her grafts were patent and the report is below. She was transferred to the ICU and all of there HF medications were placed on hold. Her levophed was eventually weaned off which she tolerated. The patient was very scared of having another shock and while in the hospital had NSVT. We talked to EP and they recommended starting Amiodarone 400 mg PO  BID and with this she had less NSVT.   Discussions took place with patient and sister about prognosis and the concern that she may be nearing advanced therapies. She met the VAD Coordinator and signed the consent to start the work-up process and decided she wanted to be sent to [facility] for transplant referral. She understands the need to call the clinic if symptoms increase such as SOB or fatigue and that we would have a low threshold to do a repeat RHC.  On day of discharge she had no SOB, orthopnea, dizziness or CP. She was ambulating in the halls and felt stable to go home. She will be seen next week in the Heart Failure clinic with BMET. Her coreg was stopped along with Spironolactone which we will try to add slowly back on as OP blood pressure willing and she was restarted on low dose lisinopril.   Discharge Weight Range: 182 lbs Discharge Vitals: Blood pressure 102/62, pulse 82, temperature 98.2 F (36.8 C), temperature source Oral, resp. rate 20, height $RemoveBe'5\' 4"'nEUiByjga$  (1.626 m), weight 182 lb 15.7 oz (83 kg), SpO2 92.00%.  Labs: Lab Results  Component Value Date   WBC 7.0 10/02/2013   HGB 11.4* 10/02/2013   HCT 35.0* 10/02/2013   MCV 90.9 10/02/2013   PLT 153 10/02/2013     Recent Labs Lab 10/06/13 0332 10/07/13 0246  NA 140 141  K 4.4 4.6  CL 103 103  CO2 26 25  BUN  35* 31*  CREATININE 1.37* 1.38*  CALCIUM 9.4 9.5  PROT 6.8  --   BILITOT <0.2*  --   ALKPHOS 73  --   ALT 21  --   AST 16  --   GLUCOSE 175* 93   Lab Results  Component Value Date   CHOL 140 06/07/2013   HDL 36.40* 06/07/2013   LDLCALC 82 06/07/2013   TRIG 106.0 06/07/2013   BNP (last 3 results)  Recent Labs  11/16/12 0834 06/07/13 1041  PROBNP 181.0* 899.80*    Diagnostic Studies/Procedures   10/04/13: RA = 5  RV = 423/2/5  PA = 25/6 (14)  PCW = 12  Fick cardiac output/index = 3.2/1.7 (duing junctional rhythm)  PVR =0.7 [place]  FA sat = 95%  PA sat = 45%, 47%  PA sat = 55% (with levophed 5 at end of case)  Ao  Pressure: 117/60 (85)  LV Pressure: 110/8/18  There was no signficant gradient across the aortic valve on pullback.   Left main: Normal  LAD: Totally occluded proximally after first diagonal. 1st diagonal 70% prox. Upper branch fills well antegrade. Lower branch has competitive flow from vein graft.  LCX: Diffuse 99% throughout midsection. Ramus occluded.OM-1 small. OM-2 & OM-3 occluded.  RCA: Totally occluded in the midsection  LIMA - LAD: Patent  SVG - Ramus: Patent with 30% distal. Collateralizes LCX  SVG - D1: Patent  SVG - D2: Patent  SVG - Right acute marginal: Patent. Backfills the distal RCA through 95% ostial lesion in the native acute marginal   1. Severe native CAD with all bypass grafts patent    Ct Abdomen Pelvis Wo Contrast  10/07/2013   CLINICAL DATA:  Evaluation prior to potential ventricular assist device (VAD) placement.  EXAM: CT CHEST, ABDOMEN AND PELVIS WITHOUT CONTRAST  TECHNIQUE: Multidetector CT imaging of the chest, abdomen and pelvis was performed following the standard protocol without IV contrast.  COMPARISON:  CT of the abdomen and pelvis 11/07/2007. CT of the chest 12/13/2005.  FINDINGS: CT CHEST FINDINGS  Mediastinum: Heart size is mildly enlarged. There is no significant pericardial fluid, thickening or pericardial calcification. There is atherosclerosis of the thoracic aorta, the great vessels of the mediastinum and the coronary arteries, including calcified atherosclerotic plaque in the left main, left anterior descending, left circumflex and right coronary arteries. Status post median sternotomy for CABG, including LIMA to the LAD. On image 18 of series 201 the LIMA graft comes in closest proximity to the overlying sternotomy, approximately 1.6 cm deep and 2.1 cm to the left of midline. There is also a mitral annuloplasty ring noted. The apex of the left ventricle is deep to the anterior aspects of the left fourth through fifth ribs. Left-sided pacemaker/AICD with  lead tip terminating in the right ventricular apex. No pathologically enlarged mediastinal or hilar lymph nodes. Please note that accurate exclusion of hilar adenopathy is limited on noncontrast CT scans. Small hiatal hernia.  Lungs/Pleura: In the apex of the right upper lobe posteriorly there is a 10 x 9 mm ground-glass attenuation nodule with irregular margins (image 15 of series 202). Several other tiny nodules are seen scattered throughout the lungs bilaterally, largest of which measures only 3 mm in the subpleural aspect of the lingula (image 28 of series 202). No acute consolidative airspace disease. No pleural effusions. There is a background of very mild ground-glass attenuation and mild interlobular septal thickening, suggesting some mild interstitial pulmonary edema.  Musculoskeletal: Median sternotomy wires. There are  no aggressive appearing lytic or blastic lesions noted in the visualized portions of the skeleton.  CT ABDOMEN AND PELVIS FINDINGS  Abdomen/Pelvis: Small calcified gallstones are noted within the gallbladder which is nearly completely contracted. No findings to suggest acute cholecystitis at this time. The unenhanced appearance of the liver, pancreas, spleen, bilateral adrenal glands in the left kidney is unremarkable. There is a 2 mm calcification in the interpolar region of the right kidney which may represent a vascular calcification or a tiny nonobstructive calculus. Extensive atherosclerosis throughout the abdominal and pelvic vasculature, without evidence of aneurysm. No significant volume of ascites. No pneumoperitoneum. No pathologic distention of small bowel. Normal appendix. Uterus and ovaries are atrophic. Urinary bladder is normal in appearance. No definite lymphadenopathy identified in the abdomen or pelvis on today's non contrast CT examination.  Musculoskeletal: Multiple areas of fluid attenuation and tiny locules of gas in the subcutaneous fat of the lower abdomen, presumably  iatrogenic. There are no aggressive appearing lytic or blastic lesions noted in the visualized portions of the skeleton.  IMPRESSION: 1. Cardiomegaly with evidence of mild interstitial pulmonary edema, suggesting mild congestive heart failure. Findings and measurements pertinent to potential VAD placement, as above. 2. 10 x 9 mm ground-glass attenuation nodule with irregular margins in the apex of the right upper lobe. This is nonspecific, and could certainly be infectious or inflammatory, or could be related to underlying edema. However, attention on followup studies is recommended. Additionally, there are several other tiny pulmonary nodules. Initial follow-up by chest CT without contrast is recommended in 3 months to confirm persistence. This recommendation follows the consensus statement: Recommendations for the Management of Subsolid Pulmonary Nodules Detected at CT: A Statement from the [place] as published in Radiology 2013; 266:304-317. 3. Cholelithiasis without evidence to suggest acute cholecystitis at this time. 4. 2 mm calcification in the interpolar region of the right kidney may represent a vascular calcification or a tiny nonobstructive calculus. 5. Additional findings, as above.   Electronically Signed   By: Vinnie Langton M.D.   On: 10/07/2013 08:31   Ct Chest Wo Contrast  10/07/2013   CLINICAL DATA:  Evaluation prior to potential ventricular assist device (VAD) placement.  EXAM: CT CHEST, ABDOMEN AND PELVIS WITHOUT CONTRAST  TECHNIQUE: Multidetector CT imaging of the chest, abdomen and pelvis was performed following the standard protocol without IV contrast.  COMPARISON:  CT of the abdomen and pelvis 11/07/2007. CT of the chest 12/13/2005.  FINDINGS: CT CHEST FINDINGS  Mediastinum: Heart size is mildly enlarged. There is no significant pericardial fluid, thickening or pericardial calcification. There is atherosclerosis of the thoracic aorta, the great vessels of the mediastinum and  the coronary arteries, including calcified atherosclerotic plaque in the left main, left anterior descending, left circumflex and right coronary arteries. Status post median sternotomy for CABG, including LIMA to the LAD. On image 18 of series 201 the LIMA graft comes in closest proximity to the overlying sternotomy, approximately 1.6 cm deep and 2.1 cm to the left of midline. There is also a mitral annuloplasty ring noted. The apex of the left ventricle is deep to the anterior aspects of the left fourth through fifth ribs. Left-sided pacemaker/AICD with lead tip terminating in the right ventricular apex. No pathologically enlarged mediastinal or hilar lymph nodes. Please note that accurate exclusion of hilar adenopathy is limited on noncontrast CT scans. Small hiatal hernia.  Lungs/Pleura: In the apex of the right upper lobe posteriorly there is a 10 x 9 mm ground-glass  attenuation nodule with irregular margins (image 15 of series 202). Several other tiny nodules are seen scattered throughout the lungs bilaterally, largest of which measures only 3 mm in the subpleural aspect of the lingula (image 28 of series 202). No acute consolidative airspace disease. No pleural effusions. There is a background of very mild ground-glass attenuation and mild interlobular septal thickening, suggesting some mild interstitial pulmonary edema.  Musculoskeletal: Median sternotomy wires. There are no aggressive appearing lytic or blastic lesions noted in the visualized portions of the skeleton.  CT ABDOMEN AND PELVIS FINDINGS  Abdomen/Pelvis: Small calcified gallstones are noted within the gallbladder which is nearly completely contracted. No findings to suggest acute cholecystitis at this time. The unenhanced appearance of the liver, pancreas, spleen, bilateral adrenal glands in the left kidney is unremarkable. There is a 2 mm calcification in the interpolar region of the right kidney which may represent a vascular calcification or a  tiny nonobstructive calculus. Extensive atherosclerosis throughout the abdominal and pelvic vasculature, without evidence of aneurysm. No significant volume of ascites. No pneumoperitoneum. No pathologic distention of small bowel. Normal appendix. Uterus and ovaries are atrophic. Urinary bladder is normal in appearance. No definite lymphadenopathy identified in the abdomen or pelvis on today's non contrast CT examination.  Musculoskeletal: Multiple areas of fluid attenuation and tiny locules of gas in the subcutaneous fat of the lower abdomen, presumably iatrogenic. There are no aggressive appearing lytic or blastic lesions noted in the visualized portions of the skeleton.  IMPRESSION: 1. Cardiomegaly with evidence of mild interstitial pulmonary edema, suggesting mild congestive heart failure. Findings and measurements pertinent to potential VAD placement, as above. 2. 10 x 9 mm ground-glass attenuation nodule with irregular margins in the apex of the right upper lobe. This is nonspecific, and could certainly be infectious or inflammatory, or could be related to underlying edema. However, attention on followup studies is recommended. Additionally, there are several other tiny pulmonary nodules. Initial follow-up by chest CT without contrast is recommended in 3 months to confirm persistence. This recommendation follows the consensus statement: Recommendations for the Management of Subsolid Pulmonary Nodules Detected at CT: A Statement from the [place] as published in Radiology 2013; 266:304-317. 3. Cholelithiasis without evidence to suggest acute cholecystitis at this time. 4. 2 mm calcification in the interpolar region of the right kidney may represent a vascular calcification or a tiny nonobstructive calculus. 5. Additional findings, as above.   Electronically Signed   By: Vinnie Langton M.D.   On: 10/07/2013 08:31    Discharge Medications     Medication List    STOP taking these medications        carvedilol 6.25 MG tablet  Commonly known as:  COREG      TAKE these medications       allopurinol 100 MG tablet  Commonly known as:  ZYLOPRIM  Take 100-200 mg by mouth 2 (two) times daily. Takes 200 mg in the morning and 100 mg in the evening     amiodarone 400 MG tablet  Commonly known as:  PACERONE  Take 1 tablet (400 mg total) by mouth 2 (two) times daily.     aspirin EC 81 MG tablet  Take 81 mg by mouth daily.     atorvastatin 80 MG tablet  Commonly known as:  LIPITOR  Take 1 tablet (80 mg total) by mouth daily.     digoxin 0.125 MG tablet  Commonly known as:  LANOXIN  Take 0.5 tablets (0.0625 mg total) by  mouth every other day.     furosemide 40 MG tablet  Commonly known as:  LASIX  Take two tablet in the morning, then one tablet in the evening     insulin glargine 100 UNIT/ML injection  Commonly known as:  LANTUS  Inject 38 Units into the skin at bedtime.     insulin lispro 100 UNIT/ML injection  Commonly known as:  HUMALOG  Inject 5-8 Units into the skin 3 (three) times daily before meals.     lisinopril 5 MG tablet  Commonly known as:  PRINIVIL,ZESTRIL  Take 0.5 tablets (2.5 mg total) by mouth at bedtime.     nitroGLYCERIN 0.4 MG SL tablet  Commonly known as:  NITROSTAT  Place 1 tablet (0.4 mg total) under the tongue every 5 (five) minutes as needed. For chest pain.     potassium chloride SA 20 MEQ tablet  Commonly known as:  K-DUR,KLOR-CON  Take 1 tablet (20 mEq total) by mouth daily.     spironolactone 25 MG tablet  Commonly known as:  ALDACTONE  Take 0.5 tablets (12.5 mg total) by mouth daily.        Disposition   The patient will be discharged in stable condition to home. Discharge Instructions   ACE Inhibitor / ARB already ordered    Complete by:  As directed      Contraindication beta blocker-discharge    Complete by:  As directed      Diet - low sodium heart healthy    Complete by:  As directed      Discharge instructions     Complete by:  As directed   Stop your coreg. Change your spironolactone to 12.5 mg daily, which is a half tablet Change your lisinopril to 2.5 mg (1/2 tablet) at night. Start Amiodarone 400 mg twice a day.     Heart Failure patients record your daily weight using the same scale at the same time of day    Complete by:  As directed      Increase activity slowly    Complete by:  As directed      STOP any activity that causes chest pain, shortness of breath, dizziness, sweating, or exessive weakness    Complete by:  As directed           Follow-up Information   Follow up with [facility] On 10/07/2013.   Specialty:  Cardiology   Contact information:   [address] [id number] Danice Goltz [place] [address] [phone]      Follow up On 10/14/2013. (@ 9:45 am; [facility]. Please bring all your medications to your visit. )         Duration of Discharge Encounter: Greater than 35 minutes   Signed, Rande Brunt  10/07/2013, 7:50 PM  Patient seen and examined with Junie Bame, NP. We discussed all aspects of the encounter. I agree with the assessment and plan as stated above.   She has advanced HF. Now stabilized. Will refer to [facility] for transplant evaluation. If more symptomatic may need IV inotropes or VAD as bridge. Will follow closely in HF Clinic.   Benay Spice 9:36 PM

## 2013-10-07 NOTE — Plan of Care (Signed)
Problem: Food- and Nutrition-Related Knowledge Deficit (NB-1.1) Goal: Nutrition education Formal process to instruct or train a patient/client in a skill or to impart knowledge to help patients/clients voluntarily manage or modify food choices and eating behavior to maintain or improve health. Outcome: Completed/Met Date Met:  10/07/13 Nutrition Education Note  RD consulted for VAD. Pt with no recent weight changes or changes in appetite.  RD provided "Low Sodium Nutrition Therapy" handout from the [employer] of [employer]. Reviewed patient's dietary recall. Provided examples on ways to decrease sodium intake in diet. Discouraged intake of processed foods and use of salt shaker. Encouraged fresh fruits and vegetables as well as whole grain sources of carbohydrates to maximize fiber intake.   RD discussed why it is important for patient to adhere to diet recommendations, and emphasized the role of fluids, foods to avoid, and importance of weighing self daily. Teach back method used.  Expect good compliance.  Body mass index is 31.39 kg/(m^2). Pt meets criteria for obesity based on current BMI.  Current diet order is heat healthy- low sodium, patient is consuming approximately 100% of meals at this time. Labs and medications reviewed. No further nutrition interventions warranted at this time. RD contact information provided. If additional nutrition issues arise, please re-consult RD.   Terrace Arabia RD, LDN

## 2013-10-10 LAB — LUPUS ANTICOAGULANT PANEL
DRVVT: 34.7 s
Lupus Anticoagulant: NOT DETECTED
PTT Lupus Anticoagulant: 36.9 s (ref 28.0–43.0)

## 2013-10-10 LAB — FACTOR 5 LEIDEN

## 2013-10-14 ENCOUNTER — Ambulatory Visit (HOSPITAL_COMMUNITY)
Admit: 2013-10-14 | Discharge: 2013-10-14 | Disposition: A | Discharge status: Home / Self Care | Payer: Medicare Other | Source: Ambulatory Visit | Attending: Internal Medicine | Admitting: Internal Medicine

## 2013-10-14 ENCOUNTER — Telehealth (HOSPITAL_COMMUNITY): Payer: Self-pay

## 2013-10-14 ENCOUNTER — Telehealth (HOSPITAL_COMMUNITY): Payer: Self-pay | Admitting: Vascular Surgery

## 2013-10-14 ENCOUNTER — Encounter (HOSPITAL_COMMUNITY): Payer: Self-pay | Admitting: Anesthesiology

## 2013-10-14 ENCOUNTER — Encounter (HOSPITAL_COMMUNITY): Payer: Self-pay

## 2013-10-14 VITALS — BP 98/50 | HR 66 | Resp 16 | Wt 181.1 lb

## 2013-10-14 DIAGNOSIS — I129 Hypertensive chronic kidney disease with stage 1 through stage 4 chronic kidney disease, or unspecified chronic kidney disease: Secondary | ICD-10-CM | POA: Diagnosis not present

## 2013-10-14 DIAGNOSIS — I472 Ventricular tachycardia, unspecified: Secondary | ICD-10-CM

## 2013-10-14 DIAGNOSIS — I4729 Other ventricular tachycardia: Secondary | ICD-10-CM | POA: Diagnosis not present

## 2013-10-14 DIAGNOSIS — Z951 Presence of aortocoronary bypass graft: Secondary | ICD-10-CM | POA: Diagnosis not present

## 2013-10-14 DIAGNOSIS — I5022 Chronic systolic (congestive) heart failure: Secondary | ICD-10-CM | POA: Insufficient documentation

## 2013-10-14 DIAGNOSIS — Z7982 Long term (current) use of aspirin: Secondary | ICD-10-CM | POA: Insufficient documentation

## 2013-10-14 DIAGNOSIS — I059 Rheumatic mitral valve disease, unspecified: Secondary | ICD-10-CM | POA: Insufficient documentation

## 2013-10-14 DIAGNOSIS — Z87891 Personal history of nicotine dependence: Secondary | ICD-10-CM | POA: Insufficient documentation

## 2013-10-14 DIAGNOSIS — Z95810 Presence of automatic (implantable) cardiac defibrillator: Secondary | ICD-10-CM | POA: Insufficient documentation

## 2013-10-14 DIAGNOSIS — I2510 Atherosclerotic heart disease of native coronary artery without angina pectoris: Secondary | ICD-10-CM | POA: Diagnosis not present

## 2013-10-14 DIAGNOSIS — I739 Peripheral vascular disease, unspecified: Secondary | ICD-10-CM | POA: Diagnosis not present

## 2013-10-14 DIAGNOSIS — I509 Heart failure, unspecified: Secondary | ICD-10-CM | POA: Insufficient documentation

## 2013-10-14 DIAGNOSIS — N183 Chronic kidney disease, stage 3 unspecified: Secondary | ICD-10-CM

## 2013-10-14 DIAGNOSIS — I2589 Other forms of chronic ischemic heart disease: Secondary | ICD-10-CM | POA: Diagnosis not present

## 2013-10-14 DIAGNOSIS — F1011 Alcohol abuse, in remission: Secondary | ICD-10-CM | POA: Insufficient documentation

## 2013-10-14 DIAGNOSIS — I498 Other specified cardiac arrhythmias: Secondary | ICD-10-CM | POA: Insufficient documentation

## 2013-10-14 DIAGNOSIS — F1411 Cocaine abuse, in remission: Secondary | ICD-10-CM | POA: Diagnosis not present

## 2013-10-14 DIAGNOSIS — Z794 Long term (current) use of insulin: Secondary | ICD-10-CM | POA: Diagnosis not present

## 2013-10-14 DIAGNOSIS — E119 Type 2 diabetes mellitus without complications: Secondary | ICD-10-CM | POA: Diagnosis not present

## 2013-10-14 LAB — BASIC METABOLIC PANEL
ANION GAP: 16 — AB (ref 5–15)
BUN: 55 mg/dL — AB (ref 6–23)
CHLORIDE: 100 meq/L (ref 96–112)
CO2: 22 meq/L (ref 19–32)
Calcium: 9.7 mg/dL (ref 8.4–10.5)
Creatinine, Ser: 2.2 mg/dL — ABNORMAL HIGH (ref 0.50–1.10)
GFR calc non Af Amer: 24 mL/min — ABNORMAL LOW (ref >90)
GFR, EST AFRICAN AMERICAN: 28 mL/min — AB (ref >90)
Glucose, Bld: 190 mg/dL — ABNORMAL HIGH (ref 70–99)
POTASSIUM: 4.7 meq/L (ref 3.7–5.3)
Sodium: 138 mEq/L (ref 137–147)

## 2013-10-14 MED ORDER — FUROSEMIDE 40 MG PO TABS: ORAL_TABLET | ORAL | Status: DC

## 2013-10-14 NOTE — Telephone Encounter (Signed)
Left message for patient to return call.  Needs to hold lasix x 2 days then repeat bmet next week.

## 2013-10-14 NOTE — Telephone Encounter (Signed)
Pt called about lab results. °

## 2013-10-14 NOTE — Telephone Encounter (Signed)
Left message to call back  

## 2013-10-14 NOTE — Telephone Encounter (Signed)
Pt aware of labs results, she will hold lasix over weekend and restart on Mon at 80 mg daily, repeat labs on 8/25

## 2013-10-14 NOTE — Progress Notes (Addendum)
Patient ID: [name], female   DOB: [birth year], 58 y.o.   MRN: [id number]  PCP: Dr. Nicoletta Fields (Internal Medicine)  HPI: 58 year old female with history of HTN, DM2, past polysubstance abuse (ETOH, tobacco, cocaine), CAD s/p CABG x5 with MV annuloplasty (2007), ICM s/p ICD, chronic systolic HF, severe MR, CKD stage III and PAD.   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 At time of cath LAD, LCX and RCA occluded. EF 45%. Had abrupt stent occlusion the next day and had to go back to the lab. 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   Echos: 05/19/2012  EF 15% 2/15 EF 15%, diffuse hypokinesis, restrictive diastolic function, s/p MV repair with moderate-severe MR.   CPX: 06/17/12 Peak VO2 14.8 (predicted peak VO2 68.5%), VE/VCO2 slope 43.4 OUES: 1.19, Peak RER 1.12 Vent threshold 11.3 (pred peak VO2 52.3%)  CPX (3/15): peak VO2 15.6 (predicted peak VO2 74.5%) VE/VO2 40.8, RER 1.2  RHC (3/15): Mean RA 7 PA 60/22, mean 38 Mean PCWP 23 with prominent v-waves CI 2.5 PVR 3.8  R/LHC (10/2013) RA = 5  RV = 423/2/5  PA = 25/6 (14)  PCW = 12  Fick cardiac output/index = 3.2/1.7 (duing junctional rhythm)  PVR =0.7 Woods  FA sat = 95%  PA sat = 45%, 47%  PA sat = 55% (with levophed 5 at end of case) 1) severe native CAD with all bypass grafts patent  Admitted 8/2-8/7 for syncope s/p ICD shock. Concern that VT was possibly from ischemia and taken for [name] During cath patient had vagal response and BP dropped to 50s and co-ox was in the upper 40s. Placed on Levophed and transferred to floor. Started on Amiodarone.   [name] for Heart Failure: Since being home has noticed more dizziness. She reports dizziness everyday. She was supposed to stop coreg, however she has been taking. Occasional SOB. Denies palpitations, CP, edema or orthopnea. Weight at home 179 lbs. Able to walk about a block before having to  stop. Following a low salt diet and drinking less than 2L a day.   Labs:  08/12/12 - on lipitor 40 mg daily Cholesterol 180 Triglyceride 195 HDL 29 LDL 112 K 4.5, creatinine 1.4 11/16/12 K 4.0 Labs 1.5 Pro BNP 181 02/14/13: K+ 4.6, Cr 1.33, Dig level 1.8, TSH 2.97 3/15: K 4.2, creatinine 1.07, HCT 32.4 06/2013: Dig level 0.3, pro-BNP 899, Cholesterol 140, TG 106, HDL 36, LDL 82, Cr 1.5, K+ 4.9  FH: Mother deceased: CAD, DM2, HTN        Father deceased: stroke  SH: Works odds/end jobs for [employer]; disabled. Lives in [place] with 2 sons.   ROS: All systems negative except as listed in HPI, PMH and Problem List.  Past Medical History  Diagnosis Date  . Coronary artery disease     a. s/p CABG x 5 with MV annuloplasty 2007   . Diabetes mellitus   . Hypertension   . Chronic systolic heart failure     a. ICM b. ECHO (06/2013): EF 15%, severe MR (06/2013) c. RHC (10/2013): RA 5, RV 23/2/5, PA 25/6 (14), PCWP 12, Fick CO/CI: 3.2/1.7, PVR 0.7 WU, PA 45%, 47% and 55% (with levophed 5), vagal response during cath   . Polysubstance abuse     history of  (cocaine, tobacco and ETOH)  . V-tach   . Ischemic cardiomyopathy  a. s/p ICD b. LHC (10/04/13): 1. Severe native CAD with all bypass grafts patent      Current Outpatient Prescriptions  Medication Sig Dispense Refill  . allopurinol (ZYLOPRIM) 100 MG tablet Take 100-200 mg by mouth 2 (two) times daily. Takes 200 mg in the morning and 100 mg in the evening      . amiodarone (PACERONE) 400 MG tablet Take 1 tablet (400 mg total) by mouth 2 (two) times daily.  60 tablet  3  . aspirin EC 81 MG tablet Take 81 mg by mouth daily.      [name] atorvastatin (LIPITOR) 80 MG tablet Take 1 tablet (80 mg total) by mouth daily.  30 tablet  11  . carvedilol (COREG) 6.25 MG tablet Take 6.25 mg by mouth 2 (two) times daily with a meal.      . digoxin (LANOXIN) 0.125 MG tablet Take 0.5 tablets (0.0625 mg total) by mouth every other day.  8 tablet  3  . furosemide  (LASIX) 40 MG tablet 80 mg (2 tablets) in the am and 40 mg (1 tablet) in the pm      . insulin glargine (LANTUS) 100 UNIT/ML injection Inject 38 Units into the skin at bedtime.      . insulin lispro (HUMALOG) 100 UNIT/ML injection Inject 5-8 Units into the skin 3 (three) times daily before meals.      [name] lisinopril (PRINIVIL,ZESTRIL) 5 MG tablet Take 0.5 tablets (2.5 mg total) by mouth at bedtime.  15 tablet  3  . nitroGLYCERIN (NITROSTAT) 0.4 MG SL tablet Place 1 tablet (0.4 mg total) under the tongue every 5 (five) minutes as needed. For chest pain.  25 tablet  11  . potassium chloride SA (K-DUR,KLOR-CON) 20 MEQ tablet Take 1 tablet (20 mEq total) by mouth daily.  30 tablet  9  . spironolactone (ALDACTONE) 25 MG tablet Take 0.5 tablets (12.5 mg total) by mouth daily.  15 tablet  3  . [DISCONTINUED] sitaGLIPtin (JANUVIA) 25 MG tablet Take 1 tablet (25 mg total) by mouth daily.  30 tablet  1   No current facility-administered medications for this encounter.    Filed Vitals:   10/14/13 0951  BP: 98/50  Pulse: 66  Resp: 16  Weight: 181 lb 2 oz (82.158 kg)  SpO2: 98%   PHYSICAL EXAM: General:  Well appearing. No resp difficulty HEENT: normal Neck: supple. JVP flat.  Carotids 2+ bilaterally; no bruits. No lymphadenopathy or thryomegaly appreciated. Cor: PMI normal. Regular rate & rhythm. 3/6 HSM apex.  Lungs: clear Abdomen: soft, nontender, mildly distended. No hepatosplenomegaly. No bruits or masses. Good bowel sounds. Extremities: no cyanosis, clubbing, rash, edema Neuro: alert & orientedx3, cranial nerves grossly intact. Moves all 4 extremities w/o difficulty. Affect pleasant.   ASSESSMENT & PLAN:  1. Chronic Systolic Heart Failure: Ischemic cardiomyopathy. EF 15% (4/15). She has a [employer] ICD that does not have Optivol.  - Reviewed discharge summary and R/LHC.  - NYHA IIIb symptoms and volume status stable. I think she maybe a little dry will cut lasix back to 80 mg daily and told  to take an extra 40 mg in the afternoons only if weight greater than 3 lbs in a day or 5 lbs in a week.  - She was supposed to stop coreg after admission but continued daily. She is having every day dizziness. Will stop coreg. - Continue lisinopril, digoxin and spironolactone at current doses. - Low threshold to repeat RHC if she starts feeling worse  and lengthy discussion with patient to call if increased SOB, dizziness or fatigue. - Will send all her information to [facility] for tx evaluation and will set her up for PFTs and surgical consult. - Reinforced the need and importance of daily weights, a low sodium diet, and fluid restriction (less than 2 L a day). Instructed to call the HF clinic if weight increases more than 3 lbs overnight or 5 lbs in a week.  2. CAD: No chest pain.  Continue ASA, statin and ACE-I 3. Mitral regurgitation: Patient had mitral valve annuloplasty with CABG.  She has a significant MR murmur on exam. Moderate to severe MR on echo in 4/15. TEE (06/2013) mitral regurg appears severe, anatomy of repaired valve does not look suitable for MV clipping and would be high risk for MV replacement. Continue to follow.  4. Hx drug abuse: Remains abstinent from drug use and ETOH for >2 years. Congratulated the patient.   5. CKD stage III - baseline Cr 1.5. Check BMET today.  6. NSVT - denies any palpitations and now is on amiodarone 400 mg BID. Will continue 400 mg BID and at next visit will cut back to 200 mg BID.  F/U 1 month Rande Brunt 10/14/2013

## 2013-10-14 NOTE — Patient Instructions (Signed)
Doing great.  Stop coreg.  Cut your lasix back to 80 mg (2 tablets) in the morning daily. If you gain more than 3 lbs in a day or 5 lbs in a week take an extra 40 mg of lasix.  Call any issues.  Follow up in 1 month  Do the following things EVERYDAY: 1) Weigh yourself in the morning before breakfast. Write it down and keep it in a log. 2) Take your medicines as prescribed 3) Eat low salt foods-Limit salt (sodium) to 2000 mg per day.  4) Stay as active as you can everyday 5) Limit all fluids for the day to less than 2 liters 6)

## 2013-10-24 ENCOUNTER — Encounter (HOSPITAL_COMMUNITY): Payer: Self-pay

## 2013-10-25 ENCOUNTER — Ambulatory Visit (HOSPITAL_COMMUNITY)
Admission: RE | Admit: 2013-10-25 | Discharge: 2013-10-25 | Disposition: A | Discharge status: Home / Self Care | Payer: Medicare Other | Source: Ambulatory Visit | Attending: Cardiology | Admitting: Cardiology

## 2013-10-25 ENCOUNTER — Telehealth (HOSPITAL_COMMUNITY): Payer: Self-pay | Admitting: *Deleted

## 2013-10-25 ENCOUNTER — Encounter: Payer: Self-pay | Admitting: Licensed Clinical Social Worker

## 2013-10-25 ENCOUNTER — Encounter (HOSPITAL_COMMUNITY): Payer: Self-pay

## 2013-10-25 DIAGNOSIS — I509 Heart failure, unspecified: Secondary | ICD-10-CM | POA: Insufficient documentation

## 2013-10-25 DIAGNOSIS — I5022 Chronic systolic (congestive) heart failure: Secondary | ICD-10-CM | POA: Insufficient documentation

## 2013-10-25 LAB — BASIC METABOLIC PANEL
Anion gap: 14 (ref 5–15)
BUN: 42 mg/dL — ABNORMAL HIGH (ref 6–23)
CO2: 24 mEq/L (ref 19–32)
Calcium: 9.7 mg/dL (ref 8.4–10.5)
Chloride: 101 mEq/L (ref 96–112)
Creatinine, Ser: 1.92 mg/dL — ABNORMAL HIGH (ref 0.50–1.10)
GFR calc Af Amer: 33 mL/min — ABNORMAL LOW (ref >90)
GFR calc non Af Amer: 28 mL/min — ABNORMAL LOW (ref >90)
Glucose, Bld: 87 mg/dL (ref 70–99)
Potassium: 4.7 mEq/L (ref 3.7–5.3)
Sodium: 139 mEq/L (ref 137–147)

## 2013-10-25 MED ORDER — AMOXICILLIN 500 MG PO TABS: 2000.0000 mg | ORAL_TABLET | ORAL | Status: DC | PRN

## 2013-10-25 NOTE — Progress Notes (Signed)
CSW rec'd referral to complete an LVAD psychosocial assessment. CSW spoke with patient and need for caregiver to join the assessment and availability to complete assessment. Patient has appointment with Dr. Cyndia Bent tomorrow and will contact CSW after to further discuss time/date to complete assessment. [name], [place]

## 2013-10-25 NOTE — Telephone Encounter (Signed)
Pt states she is having dental work and ask about an antibiotic, per Dr Aundra Dubin pt with hx of mitral valve anuloplasty and will need antibiotics, pt aware and rx sent in

## 2013-10-26 ENCOUNTER — Institutional Professional Consult (permissible substitution) (INDEPENDENT_AMBULATORY_CARE_PROVIDER_SITE_OTHER): Payer: Medicare Other | Admitting: Surgery

## 2013-10-26 ENCOUNTER — Encounter: Payer: Self-pay | Admitting: Surgery

## 2013-10-26 ENCOUNTER — Telehealth (HOSPITAL_COMMUNITY): Payer: Self-pay | Admitting: Cardiology

## 2013-10-26 ENCOUNTER — Other Ambulatory Visit (HOSPITAL_COMMUNITY): Payer: Self-pay | Admitting: Anesthesiology

## 2013-10-26 VITALS — BP 102/65 | HR 61 | Ht 64.0 in | Wt 181.0 lb

## 2013-10-26 DIAGNOSIS — I5022 Chronic systolic (congestive) heart failure: Secondary | ICD-10-CM | POA: Diagnosis not present

## 2013-10-26 DIAGNOSIS — I509 Heart failure, unspecified: Secondary | ICD-10-CM | POA: Diagnosis not present

## 2013-10-26 NOTE — Telephone Encounter (Signed)
Pt scheduled for RHC on 11/01/13 Cpt code- 24235 icd 9 code-428.22  With pts current insurance- medicare A and B No pre cert req'd

## 2013-10-26 NOTE — Addendum Note (Signed)
Addended by: Hulan Fray on: 10/26/2013 08:29 PM   Modules accepted: Orders

## 2013-10-27 ENCOUNTER — Telehealth (HOSPITAL_COMMUNITY): Payer: Self-pay | Admitting: Anesthesiology

## 2013-10-27 ENCOUNTER — Encounter: Payer: Self-pay | Admitting: Surgery

## 2013-10-27 DIAGNOSIS — I509 Heart failure, unspecified: Secondary | ICD-10-CM | POA: Insufficient documentation

## 2013-10-27 NOTE — Telephone Encounter (Signed)
Pts creatinine elevated. Will stop spironolactone and scheduled for RHC next Tuesday.   [name] B 4:58 PM

## 2013-10-27 NOTE — Progress Notes (Signed)
Cardiothoracic Surgery Consultation   PCP is Moding, Karle Starch, MD Referring Provider is Bensimhon, Shaune Pascal, MD  Chief Complaint  Patient presents with  . NEW CARDIAC    EVAL LVAD    HPI:  The patient is a 58 year old woman with a past history of polysubstance abuse ( cocaine, ETOH, tobacco), coronary artery disease s/p CABG x 5 and mitral annuloplasty by me in 2007, ischemic and nonischemic cardioyopathy with chronic systolic heart failure due to declining EF, severe MR, and stage III CKD. She presented in 2007 with an acute anterior MI due to LAD occlusion in the setting of cocaine use. She apparently had all of her native vessels occluded and the LAD was opened and stented. The next day she had stent occlusion and had PCI of the LAD followed by CABG and MV annuloplasty. She has been followed by the advanced heart failure team and had a Medtronic ICD placed by Dr. Caryl Comes in 2011.  Echos:  05/19/2012 EF 15%  2/15 EF 15%, diffuse hypokinesis, restrictive diastolic function, s/p MV repair with moderate-severe MR.    CPX: 06/17/12  Peak VO2 14.8 (predicted peak VO2 68.5%), VE/VCO2 slope 43.4  OUES: 1.19, Peak RER 1.12  Vent threshold 11.3 (pred peak VO2 52.3%)    CPX (3/15): peak VO2 15.6 (predicted peak VO2 74.5%) VE/VO2 40.8, RER 1.2    RHC (3/15):  Mean RA 7  PA 60/22, mean 38  Mean PCWP 23 with prominent v-waves  CI 2.5  PVR 3.8    R/LHC (10/2013)  RA = 5  RV = 423/2/5  PA = 25/6 (14)  PCW = 12  Fick cardiac output/index = 3.2/1.7 (duing junctional rhythm)  PVR =0.7 Woods  FA sat = 95%  PA sat = 45%, 47%  PA sat = 55% (with levophed 5 at end of case)  1) severe native CAD with all bypass grafts patent    She was admitted earlier this month for syncope and ICD shock. There was concern that her VT could be due to ischemia and she underwent right and left heart cath. All of her native vessels were occluded with all bypasses patent and she had low filling pressures  with low cardiac output in the setting of junctional bradycardia with severe hypotension that was improved with Levophed. She was gently hydrated and started on amiodarone. Since discharge she has had more dizziness and has shortness of breath with moderate activity. She denies orthopnea and PND. She walked into the office today without problems. Her creatinine was 1.37 on 8/6 and rose to 2.2 on 8/14 with a rise in BUN to 55. Her diuretic was held for a few days and restarted. Her las creatinine on 8/25 was 1.92.  She denies using any drugs, ETOH or smoking in several years. Her sister is here with her today and is her main support person.  Past Medical History  Diagnosis Date  . Coronary artery disease     a. s/p CABG x 5 with MV annuloplasty 2007   . Diabetes mellitus   . Hypertension   . Chronic systolic heart failure     a. ICM b. ECHO (06/2013): EF 15%, severe MR (06/2013) c. RHC (10/2013): RA 5, RV 23/2/5, PA 25/6 (14), PCWP 12, Fick CO/CI: 3.2/1.7, PVR 0.7 WU, PA 45%, 47% and 55% (with levophed 5), vagal response during cath   . Polysubstance abuse     history of  (cocaine, tobacco and ETOH)  . V-tach   .  Ischemic cardiomyopathy     a. s/p ICD b. LHC (10/04/13): 1. Severe native CAD with all bypass grafts patent      Past Surgical History  Procedure Laterality Date  . Coronary artery bypass graft    . Cardiac catheterization    . Cardiac defibrillator placement      2011, Followed By Dr. Caryl Comes  . Tee without cardioversion N/A 06/02/2013    Procedure: TRANSESOPHAGEAL ECHOCARDIOGRAM (TEE);  Surgeon: Larey Dresser, MD;  Location: [facility] ENDOSCOPY;  Service: Cardiovascular;  Laterality: N/A;    Family History  Problem Relation Age of Onset  . Heart attack Mother     Died age 18  . Diabetes Maternal Grandmother   . Heart attack Cousin   . Heart attack Maternal Uncle     Social History History  Substance Use Topics  . Smoking status: Former Smoker    Quit date: 03/03/2011  .  Smokeless tobacco: Not on file  . Alcohol Use: Yes     Comment: Had some drinks New Years, describes social drinkingC    Current Outpatient Prescriptions  Medication Sig Dispense Refill  . allopurinol (ZYLOPRIM) 100 MG tablet Take 100-200 mg by mouth 2 (two) times daily. Takes 200 mg in the morning and 100 mg in the evening      . amiodarone (PACERONE) 400 MG tablet Take 1 tablet (400 mg total) by mouth 2 (two) times daily.  60 tablet  3  . amoxicillin (AMOXIL) 500 MG tablet Take 4 tablets (2,000 mg total) by mouth as needed. 1 hour prior to dental work  4 tablet  6  . aspirin EC 81 MG tablet Take 81 mg by mouth daily.      [name] atorvastatin (LIPITOR) 80 MG tablet Take 1 tablet (80 mg total) by mouth daily.  30 tablet  11  . digoxin (LANOXIN) 0.125 MG tablet Take 0.5 tablets (0.0625 mg total) by mouth every other day.  8 tablet  3  . furosemide (LASIX) 40 MG tablet 80 mg (2 tablets) in the am. If weight increases more than 3 lbs day or 5 lbs week take extra 40 mg (1 tablet)  75 tablet  3  . insulin glargine (LANTUS) 100 UNIT/ML injection Inject 38 Units into the skin at bedtime.      . insulin lispro (HUMALOG) 100 UNIT/ML injection Inject 5-8 Units into the skin 3 (three) times daily before meals.      [name] lisinopril (PRINIVIL,ZESTRIL) 5 MG tablet Take 0.5 tablets (2.5 mg total) by mouth at bedtime.  15 tablet  3  . nitroGLYCERIN (NITROSTAT) 0.4 MG SL tablet Place 1 tablet (0.4 mg total) under the tongue every 5 (five) minutes as needed. For chest pain.  25 tablet  11  . potassium chloride SA (K-DUR,KLOR-CON) 20 MEQ tablet Take 1 tablet (20 mEq total) by mouth daily.  30 tablet  9  . [DISCONTINUED] sitaGLIPtin (JANUVIA) 25 MG tablet Take 1 tablet (25 mg total) by mouth daily.  30 tablet  1   No current facility-administered medications for this visit.    No Known Allergies  Review of Systems  Constitutional: Positive for activity change and fatigue. Negative for fever, chills, appetite change  and unexpected weight change.  HENT: Negative.   Eyes: Negative.   Respiratory: Positive for shortness of breath.        With moderate exertion  Cardiovascular: Positive for leg swelling. Negative for chest pain.       When diuretic held  Gastrointestinal: Negative.  Negative for vomiting, abdominal pain, diarrhea and blood in stool.  Endocrine: Negative.   Genitourinary: Negative.   Musculoskeletal: Negative.   Skin: Negative.   Allergic/Immunologic: Negative.   Neurological: Negative.   Hematological: Negative.   Psychiatric/Behavioral: Negative.     BP 102/65  Pulse 61  Ht 5\' 4"  (1.626 m)  Wt 181 lb (82.101 kg)  BMI 31.05 kg/m2  SpO2 96% Physical Exam  Constitutional: She is oriented to person, place, and time. She appears well-developed and well-nourished. No distress.  HENT:  Head: Normocephalic and atraumatic.  Mouth/Throat: Oropharynx is clear and moist.  Eyes: EOM are normal. Pupils are equal, round, and reactive to light.  Neck: Normal range of motion. Neck supple. No JVD present. No thyromegaly present.  Cardiovascular: Normal rate and regular rhythm.   Murmur heard. III/VI systolic murmur at apex radiating to axilla  Pulmonary/Chest: Effort normal and breath sounds normal. No respiratory distress. She has no rales.  Abdominal: Soft. Bowel sounds are normal. She exhibits no distension and no mass. There is no tenderness.  Musculoskeletal: Normal range of motion. She exhibits no edema.  Lymphadenopathy:    She has no cervical adenopathy.  Neurological: She is alert and oriented to person, place, and time. She has normal strength. No cranial nerve deficit or sensory deficit.  Skin: Skin is dry.  Psychiatric: She has a normal mood and affect.     Diagnostic Tests:  CLINICAL DATA: Evaluation prior to potential ventricular assist  device (VAD) placement.  EXAM:  CT CHEST, ABDOMEN AND PELVIS WITHOUT CONTRAST  TECHNIQUE:  Multidetector CT imaging of the chest,  abdomen and pelvis was  performed following the standard protocol without IV contrast.  COMPARISON: CT of the abdomen and pelvis 11/07/2007. CT of the  chest 12/13/2005.  FINDINGS:  CT CHEST FINDINGS  Mediastinum: Heart size is mildly enlarged. There is no significant  pericardial fluid, thickening or pericardial calcification. There is  atherosclerosis of the thoracic aorta, the great vessels of the  mediastinum and the coronary arteries, including calcified  atherosclerotic plaque in the left main, left anterior descending,  left circumflex and right coronary arteries. Status post median  sternotomy for CABG, including LIMA to the LAD. On image 18 of  series 201 the LIMA graft comes in closest proximity to the  overlying sternotomy, approximately 1.6 cm deep and 2.1 cm to the  left of midline. There is also a mitral annuloplasty ring noted. The  apex of the left ventricle is deep to the anterior aspects of the  left fourth through fifth ribs. Left-sided pacemaker/AICD with lead  tip terminating in the right ventricular apex. No pathologically  enlarged mediastinal or hilar lymph nodes. Please note that accurate  exclusion of hilar adenopathy is limited on noncontrast CT scans.  Small hiatal hernia.  Lungs/Pleura: In the apex of the right upper lobe posteriorly there  is a 10 x 9 mm ground-glass attenuation nodule with irregular  margins (image 15 of series 202). Several other tiny nodules are  seen scattered throughout the lungs bilaterally, largest of which  measures only 3 mm in the subpleural aspect of the lingula (image 28  of series 202). No acute consolidative airspace disease. No pleural  effusions. There is a background of very mild ground-glass  attenuation and mild interlobular septal thickening, suggesting some  mild interstitial pulmonary edema.  Musculoskeletal: Median sternotomy wires. There are no aggressive  appearing lytic or blastic lesions noted in the visualized  portions  of the skeleton.  CT ABDOMEN AND PELVIS FINDINGS  Abdomen/Pelvis: Small calcified gallstones are noted within the  gallbladder which is nearly completely contracted. No findings to  suggest acute cholecystitis at this time. The unenhanced appearance  of the liver, pancreas, spleen, bilateral adrenal glands in the left  kidney is unremarkable. There is a 2 mm calcification in the  interpolar region of the right kidney which may represent a vascular  calcification or a tiny nonobstructive calculus. Extensive  atherosclerosis throughout the abdominal and pelvic vasculature,  without evidence of aneurysm. No significant volume of ascites. No  pneumoperitoneum. No pathologic distention of small bowel. Normal  appendix. Uterus and ovaries are atrophic. Urinary bladder is normal  in appearance. No definite lymphadenopathy identified in the abdomen  or pelvis on today's non contrast CT examination.  Musculoskeletal: Multiple areas of fluid attenuation and tiny  locules of gas in the subcutaneous fat of the lower abdomen,  presumably iatrogenic. There are no aggressive appearing lytic or  blastic lesions noted in the visualized portions of the skeleton.  IMPRESSION:  1. Cardiomegaly with evidence of mild interstitial pulmonary edema,  suggesting mild congestive heart failure. Findings and measurements  pertinent to potential VAD placement, as above.  2. 10 x 9 mm ground-glass attenuation nodule with irregular margins  in the apex of the right upper lobe. This is nonspecific, and could  certainly be infectious or inflammatory, or could be related to  underlying edema. However, attention on followup studies is  recommended. Additionally, there are several other tiny pulmonary  nodules. Initial follow-up by chest CT without contrast is  recommended in 3 months to confirm persistence. This recommendation  follows the consensus statement: Recommendations for the Management  of Subsolid  Pulmonary Nodules Detected at CT: A Statement from the  [place] as published in Radiology 2013; 266:304-317.  3. Cholelithiasis without evidence to suggest acute cholecystitis at  this time.  4. 2 mm calcification in the interpolar region of the right kidney  may represent a vascular calcification or a tiny nonobstructive  calculus.  5. Additional findings, as above.  Electronically Signed  By: Vinnie Langton M.D.  On: 10/07/2013 08:31   *[address] [place], [place] [address] [phone]  ------------------------------------------------------------ Transesophageal Echocardiography  Patient: Xochilth, Standish MR #: [id number] Study Date: 06/02/2013 Gender: F Age: 44 Height: 162.6cm Weight: 83.2kg BSA: 1.74m^2 Pt. Status: Room: [facility]  ADMITTING Loralie Champagne PERFORMING Aundra Dubin, Dalton SONOGRAPHER Tresa Res, RDCS Leeroy Bock, Amy SONOGRAPHER Donata Clay cc:  ------------------------------------------------------------ LV EF: 15%  ------------------------------------------------------------ Indications: Mitral regurgitation 424.0.  ------------------------------------------------------------ Study Conclusions  - Left ventricle: The cavity size was moderately dilated. Wall thickness was normal. The estimated ejection fraction was 15%. Diffuse hypokinesis. - Aortic valve: There was no stenosis. - Aorta: Normal caliber thoracic aorta with minimal plaque. - Mitral valve: The mitral valve has been repaired. The posterior leaflet is dysfunctional and poorly mobile. It does not fully coapt with the anterior leaflet. There does appear to be severe mitral regurgitation. There is no significant mitral stenosis (mean gradient 6 mmHg, likely elevated due to high flow from mitral regurgitation). - Left atrium: The atrium was mildly to moderately dilated. No evidence of thrombus in the atrial cavity or  appendage. - Pulmonary veins: There was systolic flattening in the pulmonary vein doppler pattern. - Right ventricle: The cavity size was mildly dilated. Pacer wire or catheter noted in right ventricle. There was some thrombus adherent to the  ICD wire. Systolic function was mildly reduced. - Right atrium: The atrium was mildly dilated. - Atrial septum: No defect or patent foramen ovale was identified. Echo contrast study showed no right-to-left atrial level shunt, at baseline or with provocation. - Tricuspid valve: Moderate regurgitation. Peak RV-RA gradient: 62mm Hg (S). Impressions:  - Mitral regurgitation appears severe. See above for description. Anatomy of repaired valve does not look suitable for mitral valve clipping and she would be high risk for MV replacement. Transesophageal echocardiography. 2D and color Doppler. Height: Height: 162.6cm. Height: 64in. Weight: Weight: 83.2kg. Weight: 183lb. Body mass index: BMI: 31.5kg/m^2. Body surface area: BSA: 1.3m^2. Blood pressure: 136/71. Patient status: Outpatient. Location: Endoscopy.  ------------------------------------------------------------  ------------------------------------------------------------ Left ventricle: The cavity size was moderately dilated. Wall thickness was normal. The estimated ejection fraction was 15%. Diffuse hypokinesis.  ------------------------------------------------------------ Aortic valve: Trileaflet. Doppler: There was no stenosis. No significant regurgitation.  ------------------------------------------------------------ Aorta: Normal caliber thoracic aorta with minimal plaque.  ------------------------------------------------------------ Mitral valve: The mitral valve has been repaired. The posterior leaflet is dysfunctional and poorly mobile. It does not fully coapt with the anterior leaflet. There does appear to be severe mitral regurgitation. There is no significant mitral  stenosis (mean gradient 6 mmHg, likely elevated due to high flow from mitral regurgitation). Doppler: Valve area by pressure half-time: 2.65cm^2. Indexed valve area by pressure half-time: 1.35cm^2/m^2. Mean gradient: 62mm Hg (D). Peak gradient: 40mm Hg (D).  ------------------------------------------------------------ Left atrium: The atrium was mildly to moderately dilated. No evidence of thrombus in the atrial cavity or appendage.  ------------------------------------------------------------ Atrial septum: No defect or patent foramen ovale was identified. Echo contrast study showed no right-to-left atrial level shunt, at baseline or with provocation.  ------------------------------------------------------------ Pulmonary veins: There was systolic flattening in the pulmonary vein doppler pattern.  ------------------------------------------------------------ Right ventricle: The cavity size was mildly dilated. Pacer wire or catheter noted in right ventricle. There was some thrombus adherent to the ICD wire. Systolic function was mildly reduced.  ------------------------------------------------------------ Pulmonic valve: Structurally normal valve. Cusp separation was normal.  ------------------------------------------------------------ Tricuspid valve: Doppler: Moderate regurgitation.  ------------------------------------------------------------ Right atrium: The atrium was mildly dilated.  ------------------------------------------------------------ Pericardium: There was no pericardial effusion.  ------------------------------------------------------------ Post procedure conclusions Ascending Aorta:  - Normal caliber thoracic aorta with minimal plaque.  ------------------------------------------------------------  Doppler measurements Norma l Mitral valve Mean vel, 105 cm/s ----- D Pressure 83 ms ----- half-time Mean 6 mm Hg ----- gradient, D Peak 18 mm Hg  ----- gradient, D Area 2.6 cm^2 ----- (PHT) 5 Area 1.3 cm^2/m^2 ----- index 5 (PHT) Annulus 48. cm ----- VTI 3 Tricuspid valve Regurg 354 cm/s ----- peak vel Peak 50 mm Hg ----- RV-RA gradient, S  ------------------------------------------------------------ Prepared and Electronically Authenticated by  Loralie Champagne 2015-04-05T16:50:[id number]  *[facility]* [address], [place] [address] [phone]  ------------------------------------------------------------ Noninvasive Vascular Lab  Carotid Duplex Study  Patient: Annaleise, Burger MR #: [id number] Study Date: 04/25/2013 Gender: F Age: 72 Height: Weight: BSA: Pt. Status: Room:  SONOGRAPHER Caroline Sauger, RVT ATTENDING Nicoletta Dress, Na REFERRING Nicoletta Dress, Na Ceasar Mons Reports also to:  ------------------------------------------------------------ History and indications:  Indications  362.34 Transient retinal arterial occlusion. 368.11 Vision, loss, sudden.  ------------------------------------------------------------ Study information:  Study status: Routine. Procedure: A vascular evaluation was performed with the patient in the supine position. Image quality was adequate. The right CCA, right ECA, right ICA, right vertebral, left CCA, left ECA, left ICA, and left vertebral arteries were examined. Carotid duplex study. Carotid Duplex  exam including 2D imaging, color and spectral Doppler were performed on the extracranial carotid and vertebral arteries using standard established protocols. Location: Vascular laboratory. Patient status: Outpatient.  Arterial flow:  +--------------------+-------+-------+---------------------+ Location V sys V ed Comment  +--------------------+-------+-------+---------------------+ Right CCA - proximal90cm/s 11cm/s Mild intimal wall     thickening.   +--------------------+-------+-------+---------------------+ Right CCA - distal -78cm/s-13cm/sMild intimal wall     thickening.  +--------------------+-------+-------+---------------------+ Right ECA -75cm/s-4cm/s --------------------- +--------------------+-------+-------+---------------------+ Right ICA - proximal-91cm/s-20cm/sModerate, mixed     plaque in the     bifurcation and     proximal ICA.  +--------------------+-------+-------+---------------------+ Right ICA - mid -65cm/s-17cm/sModerate tortuosity.  +--------------------+-------+-------+---------------------+ Right ICA - distal 96cm/s 31cm/s --------------------- +--------------------+-------+-------+---------------------+ Right vertebral 55cm/s 14cm/s --------------------- +--------------------+-------+-------+---------------------+ Left CCA - proximal 89cm/s 17cm/s Mild intimal wall     thickening.  +--------------------+-------+-------+---------------------+ Left CCA - distal -79cm/s-21cm/sMild intimal wall     thickening.  +--------------------+-------+-------+---------------------+ Left ECA -82cm/s-7cm/s --------------------- +--------------------+-------+-------+---------------------+ Left ICA - proximal -66cm/s-24cm/sMild, mixed plaque.  +--------------------+-------+-------+---------------------+ Left ICA - mid 82cm/s 22cm/s Moderate tortuosity.  +--------------------+-------+-------+---------------------+ Left ICA - distal -80cm/s-35cm/s--------------------- +--------------------+-------+-------+---------------------+ Left vertebral 61cm/s 20cm/s --------------------- +--------------------+-------+-------+---------------------+  ------------------------------------------------------------ Summary: Bilateral: 1-39% ICA stenosis. Vertebral artery flow is antegrade. Right: ICA/CCA ratio is 1.17. Left: ICA/CCA  ratio is 0.84.  Other specific details can be found in the table(s) above. Prepared and Electronically Authenticated by  Tinnie Gens 2015-02-23T14:41:41.060   Cardiac Cath Procedure Note  Indication: Polymorphic VT. ICD shock  Procedures performed:  1) Right heart cathererization  2) Selective coronary angiography  3) Left heart catheterization  Description of procedure:   The risks and indication of the procedure were explained. Consent was signed and placed on the chart. An appropriate timeout was taken prior to the procedure. The right groin was prepped and draped in the routine sterile fashion and anesthetized with 1% local lidocaine.  A 5 FR arterial sheath was placed in the right femoral artery using a modified Seldinger technique. Standard catheters including a JL4, JR4 and angled pigtail were used. The JR4 was use for all the grafts except the SVG->D2 which was cannulated with an AR-1.  All catheter exchanges were made over a wire. An 8 FR venous sheath was placed in the right femoral vein using a modified Seldinger technique. A standard Swan-Ganz catheter was used for the procedure.  Complications: Developed junctional rhythm in the 40s with severe hypotension (SBP 40-50s) while obtaining access (suspect vagal). Resolved with IVF and levophed infusion.  Findings:  RA = 5  RV = 423/2/5  PA = 25/6 (14)  PCW = 12  Fick cardiac output/index = 3.2/1.7 (duing junctional rhythm)  PVR =0.7 Woods  FA sat = 95%  PA sat = 45%, 47%  PA sat = 55% (with levophed 5 at end of case)  Ao Pressure: 117/60 (85)  LV Pressure: 110/8/18  There was no signficant gradient across the aortic valve on pullback.  Left main: Normal  LAD: Totally occluded proximally after first diagonal. 1st diagonal 70% prox. Upper branch fills well antegrade. Lower branch has competitive flow from vein graft.  LCX: Diffuse 99% throughout midsection. Ramus occluded.OM-1 small. OM-2 & OM-3 occluded.  RCA: Totally  occluded in the midsection  LIMA - LAD: Patent  SVG - Ramus: Patent with 30% distal. Collateralizes LCX  SVG - D1: Patent  SVG - D2: Patent  SVG - Right acute marginal: Patent. Backfills the distal RCA through 95% ostial lesion in the native acute marginal  Assessment:  1. Severe native CAD with all bypass grafts patent  2. Low filling pressures with low cardiac output (in setting of junctional rhythm) - improved with levophed  3. Junctional rhythm with severe hypotension requiring levophed - suspect vagal episode  Plan/Discussion:  Her revascularization is stable. Filling pressures are ok but cardiac output markedly depressed in setting of transient junctional rhythm due to vagal episode in cath lab. Now improved on levophed. Will hydrate gently today and keep on levophed to support BP and renal function. Watch creatinine closely. May need repeat RHC at some point. Will discuss management of VT with EP.  Daniel Bensimhon,MD  11:06 AM     Impression:  She has a history of ischemic and non-ischemic cardiomyopathy dating back to 2007 when she presented with an acute anterior MI due to LAD occlusion with chronic occlusion of the LCX and RCA at that time. Her EF postop was 25%. She now has NYHA class IIIB symptoms. Her last RHC a few weeks ago showed low cardiac output and PA sat with low PA pressures and CVP in the setting of hypotension and junctional bradycardia. This improved with levophed and she was probably dehydrated. Her last TEE in 06/2013 was reviewed and the LVEF is 15% with a moderately dilated LV. The RV function looked good with mild dilatation. There is an ICD lead coursing the valve with some thrombus attached and there is moderate TR. There is severe MR but no evidence of MS. There is no AI. I think she would be an acceptable candidate for an LVAD. She is currently Intermacs 4 but I suspect that she is on a slow downhill slide and could require inotropic support in the near future.  She has class III CKD and will require close monitoring of her renal function. Ideally it would be best to have her evaluated for transplant before LVAD and she has been referred to [facility].  She will need PFT's and a repeat echo. She is scheduled for a RHC on 11/01/2013.  Plan:  She will have [facility] on 11/01/2013. She will be discussed further at [place].

## 2013-10-28 ENCOUNTER — Ambulatory Visit (INDEPENDENT_AMBULATORY_CARE_PROVIDER_SITE_OTHER): Payer: Medicare Other | Admitting: Internal Medicine

## 2013-10-28 ENCOUNTER — Encounter: Payer: Self-pay | Admitting: Internal Medicine

## 2013-10-28 VITALS — BP 101/67 | HR 65 | Temp 98.0°F | Ht 64.0 in | Wt 184.0 lb

## 2013-10-28 DIAGNOSIS — R070 Pain in throat: Secondary | ICD-10-CM

## 2013-10-28 DIAGNOSIS — M25559 Pain in unspecified hip: Secondary | ICD-10-CM | POA: Diagnosis not present

## 2013-10-28 DIAGNOSIS — E119 Type 2 diabetes mellitus without complications: Secondary | ICD-10-CM

## 2013-10-28 DIAGNOSIS — Z0000 Encounter for general adult medical examination without abnormal findings: Secondary | ICD-10-CM

## 2013-10-28 DIAGNOSIS — Z1211 Encounter for screening for malignant neoplasm of colon: Secondary | ICD-10-CM

## 2013-10-28 DIAGNOSIS — I2589 Other forms of chronic ischemic heart disease: Secondary | ICD-10-CM | POA: Diagnosis not present

## 2013-10-28 DIAGNOSIS — K7689 Other specified diseases of liver: Secondary | ICD-10-CM

## 2013-10-28 DIAGNOSIS — I158 Other secondary hypertension: Secondary | ICD-10-CM | POA: Diagnosis not present

## 2013-10-28 DIAGNOSIS — I152 Hypertension secondary to endocrine disorders: Secondary | ICD-10-CM

## 2013-10-28 DIAGNOSIS — K769 Liver disease, unspecified: Secondary | ICD-10-CM

## 2013-10-28 DIAGNOSIS — E349 Endocrine disorder, unspecified: Secondary | ICD-10-CM

## 2013-10-28 DIAGNOSIS — I255 Ischemic cardiomyopathy: Secondary | ICD-10-CM

## 2013-10-28 DIAGNOSIS — M25552 Pain in left hip: Secondary | ICD-10-CM

## 2013-10-28 LAB — GLUCOSE, CAPILLARY: Glucose-Capillary: 138 mg/dL — ABNORMAL HIGH (ref 70–99)

## 2013-10-28 MED ORDER — SALINE SPRAY 0.65 % NA SOLN: 1.0000 | NASAL | Status: DC | PRN

## 2013-10-28 MED ORDER — INSULIN GLARGINE 100 UNIT/ML ~~LOC~~ SOLN: 30.0000 [IU] | Freq: Every day | SUBCUTANEOUS | Status: DC

## 2013-10-28 MED ORDER — FLUTICASONE PROPIONATE 50 MCG/ACT NA SUSP: 1.0000 | Freq: Every day | NASAL | Status: DC

## 2013-10-28 MED ORDER — INSULIN LISPRO 100 UNIT/ML ~~LOC~~ SOLN: 6.0000 [IU] | Freq: Three times a day (TID) | SUBCUTANEOUS | Status: DC

## 2013-10-28 NOTE — Progress Notes (Signed)
   Subjective:    Patient ID: [name], female    DOB: [birth year], 58 y.o.   MRN: [id number]  HPI  [name] is a 58 year old woman with PMH of polysubstance abuse, CAD s/p CABG x5 and mitral annuloplasty in 2007, ischemic cardiomyopathy with CHF and EF 15%, DM2, and atrial fibrillation presenting for hospital follow-up.  She was recently hospitalized after her ICD fired causing her to lose consciousness.  Her ICD was interrogated, and she was found to be having ventricular tachycardia.  She continued to have non-sustained vtach in the hospital and was started on amiodarone 400 mg twice a day.  Her cardiologists recommended consideration of a heart transplant at this time given her deteriorating cardiac function likely leading to arrythmias now.  She felt "woozy" immediately after discharge for two days, but she feels back to normal currently.  She reports occasional sharp pain and tightness in her chest that goes away rapidly, but this is not similar to the pain she felt when she had a heart attack.  She has not been shocked by ICD and has had no episodes of syncope.  She recently met with Dr. Cyndia Bent to discuss getting an LVAD placed, and she is scheduled to go to [facility] on September 29th for discussion of a heart transplant.  Her cardiologists are planning on using the LVAD to bridge to heart transplant.  Review of Systems  Constitutional: Negative for fever, chills and appetite change.  HENT: Positive for congestion, postnasal drip and rhinorrhea.   Respiratory: Positive for cough and choking. Negative for shortness of breath and wheezing.   Cardiovascular: Negative for palpitations and leg swelling.  Gastrointestinal: Negative for nausea, vomiting, abdominal pain, diarrhea and constipation.  Genitourinary: Negative for difficulty urinating.  Musculoskeletal: Negative for arthralgias and myalgias.  Skin: Negative for rash.  Neurological: Negative for syncope and light-headedness.   Psychiatric/Behavioral: Negative for dysphoric mood and agitation.       Objective:   Physical Exam  Constitutional: She is oriented to person, place, and time. She appears well-developed and well-nourished.  HENT:  Head: Normocephalic and atraumatic.  Eyes: Conjunctivae and EOM are normal. Pupils are equal, round, and reactive to light.  Neck: Normal range of motion. Neck supple.  Cardiovascular: Normal rate and regular rhythm.  Exam reveals no gallop and no friction rub.   No murmur heard. Pulmonary/Chest: Effort normal and breath sounds normal. No respiratory distress. She has no wheezes. She has no rales.  Abdominal: Soft. Bowel sounds are normal. She exhibits no distension. There is no tenderness.  Musculoskeletal: She exhibits no edema and no tenderness.  Lymphadenopathy:    She has no cervical adenopathy.  Neurological: She is alert and oriented to person, place, and time. No cranial nerve deficit.  Skin: Skin is warm and dry. No rash noted. No erythema.  Psychiatric: She has a normal mood and affect.       Assessment & Plan:  Please see problem-based assessment and plan.

## 2013-10-28 NOTE — Assessment & Plan Note (Addendum)
Continues to have a feeling that there is something stuck in her throat.  It occurs at random times, but it does occur when she lies down to go to sleep.  It is associated with a dry cough and improves when she sucks on a lozenge and takes Benadryl.  She thinks it happens more when she is in cold air, and she occasionally has a runny nose.  She has never had allergies before.  These symptoms are most consistent with post-nasal drip.  GERD also a possibility but less likely. -Start Flonase daily and saline nasal spray PRN.

## 2013-10-28 NOTE — Patient Instructions (Signed)
Thank you for coming to clinic today Ms. Rackham.  General instructions: -I prescribed you two nasal sprays to help with your sore throat.  You can get them at your pharmacy. -I decreased your Lantus dose and sent refills of your insulin to your pharmacy. -Let us know if your blood sugars start to run high. -Please make a follow up appointment to return to clinic in 6 months.  Thank you for bringing your medicines today. This helps [place] keep you safe from mistakes.  Fluticasone nasal spray (Flonase) What is this medicine? FLUTICASONE (floo TIK a sone) is a corticosteroid. This medicine is used to treat the symptoms of allergies like sneezing, itchy red eyes, and itchy, runny, or stuffy nose. This medicine may be used for other purposes; ask your health care provider or pharmacist if you have questions. COMMON BRAND NAME(S): Flonase, Flonase Allergy Relief, Veramyst What should I tell my health care provider before I take this medicine? They need to know if you have any of these conditions: -infection, like tuberculosis, herpes, or fungal infection -recent surgery on nose or sinuses -taking corticosteroid by mouth -an unusual or allergic reaction to fluticasone, steroids, other medicines, foods, dyes, or preservatives -pregnant or trying to get pregnant -breast-feeding How should I use this medicine? This medicine is for use in the nose. Follow the directions on your product or prescription label. This medicine works best if used at regular intervals. Do not use more often than directed. Make sure that you are using your nasal spray correctly. After 6 months of daily use without a prescription, talk to your doctor or health care professional before using it for a longer time. Ask your doctor or health care professional if you have any questions. Talk to your pediatrician regarding the use of this medicine in children. While this drug may be used for children as young as 38 years old for selected  conditions, precautions do apply. After 2 months of daily use without a prescription in a child, talk to your pediatrician before using it for a longer time. Overdosage: If you think you have taken too much of this medicine contact a poison control center or emergency room at once. NOTE: This medicine is only for you. Do not share this medicine with others. What if I miss a dose? If you miss a dose, use it as soon as you remember. If it is almost time for your next dose, use only that dose and continue with your regular schedule. Do not use double or extra doses. What may interact with this medicine? -ketoconazole -metyrapone -some medicines for HIV -vaccines This list may not describe all possible interactions. Give your health care provider a list of all the medicines, herbs, non-prescription drugs, or dietary supplements you use. Also tell them if you smoke, drink alcohol, or use illegal drugs. Some items may interact with your medicine. What should I watch for while using this medicine? Visit your doctor or health care professional for regular checks on your progress. Some symptoms may improve within 12 hours after starting use. Check with your doctor or health care professional if there is no improvement in your condition after 3 weeks of use. Do not come in contact with people who have chickenpox or the measles while you are taking this medicine. If you do, call your doctor right away. What side effects may I notice from receiving this medicine? Side effects that you should report to your doctor or health care professional as soon as possible: -  allergic reactions like skin rash, itching or hives, swelling of the face, lips, or tongue -changes in vision -flu-like symptoms -white patches or sores in the mouth or nose Side effects that usually do not require medical attention (report to your doctor or health care professional if they continue or are bothersome): -burning or irritation inside  the nose or throat -cough -headache -nosebleed -unusual taste or smell This list may not describe all possible side effects. Call your doctor for medical advice about side effects. You may report side effects to FDA at 1-800-FDA-1088. Where should I keep my medicine? Keep out of the reach of children. Store at room temperature between 15 and 30 degrees C (59 and 86 degrees F). Throw away any unused medicine after the expiration date. NOTE: This sheet is a summary. It may not cover all possible information. If you have questions about this medicine, talk to your doctor, pharmacist, or health care provider.  2015, Elsevier/Gold Standard. (2013-06-09 15:55:20)

## 2013-10-29 NOTE — Assessment & Plan Note (Signed)
She reported some L hip pain at her last appointment that was related to her job, which included some physical labor and a lot of weight-bearing on the hip.  X-ray showed mild degenerative change, and she was referred to sports medicine.  She has since stopped that job, and she reports complete resolution of her pain.  She does not feel that she needs to go to sports medicine. -No further management required.

## 2013-10-29 NOTE — Assessment & Plan Note (Signed)
BP Readings from Last 3 Encounters:  10/28/13 101/67  10/26/13 102/65  10/14/13 98/50    Lab Results  Component Value Date   NA 139 10/25/2013   K 4.7 10/25/2013   CREATININE 1.92* 10/25/2013    Assessment: Blood pressure control: controlled Progress toward BP goal:  at goal Comments: Medications adjusted by cardiology, creatinine improving on last BMP 10/25/13.  Plan: Medications:  continue current medications: furosemide 80 mg daily, lisinopril 5 mg daily. Other plans: Labs checked by cardiology.

## 2013-10-29 NOTE — Assessment & Plan Note (Signed)
Patient is unable to undergo colonoscopy due to her heart failure.  Mammogram was ordered at her last visit, but this was not scheduled. -Mammography scheduled for next month. -Given FOBT cards to screen for colon cancer.  Nurse instructed patient on proper use.

## 2013-10-29 NOTE — Assessment & Plan Note (Signed)
She looks great despite her low EF, and her NYHA class is 2 or 3 with mild symptoms limiting her activity to some extent.  It does appear that her heart function is worsening, and she will likely need a heart transplant.  Despite her lack of compliance in the past, she has been very diligent with her meds and would be a great transplant candidate. She will likely need to be bridged to transplant with LVAD.  She was started on amiodarone, so she will need to be monitored for toxicity. -Meeting with [facility] to discuss transplant in September. -Will monitor LFTs, TFTs, and PFTs while on amiodarone. -Continue current meds per cardiology recommendations.

## 2013-10-29 NOTE — Assessment & Plan Note (Signed)
Lab Results  Component Value Date   HGBA1C 6.7* 10/07/2013   HGBA1C 6.6 09/09/2013   HGBA1C 7.3 04/14/2013     Assessment: Diabetes control: good control (HgbA1C at goal) Progress toward A1C goal:  at goal Comments: CBGs within goal, but often having to hold meal time insulin due to low sugars (70s).  Also, not adjusting meal time insulin and just using 5-6 units arbitrarily.  Plan: Medications:  Decrease Lantus to 30 units QHS. Switch Humalog to fixed dose of 6 units with meals. Home glucose monitoring: Frequency: 3 times a day Timing: before meals Self management tools provided: copy of home glucose meter download Other plans: Told to call clinic if she notices her sugars start being elevated.

## 2013-10-31 ENCOUNTER — Other Ambulatory Visit (HOSPITAL_COMMUNITY): Payer: Self-pay | Admitting: *Deleted

## 2013-10-31 DIAGNOSIS — Z01818 Encounter for other preprocedural examination: Secondary | ICD-10-CM

## 2013-11-01 ENCOUNTER — Encounter (HOSPITAL_COMMUNITY)
Admission: RE | Disposition: A | Discharge status: Home / Self Care | Payer: Self-pay | Source: Ambulatory Visit | Attending: Internal Medicine

## 2013-11-01 ENCOUNTER — Ambulatory Visit (HOSPITAL_COMMUNITY)
Admission: RE | Admit: 2013-11-01 | Discharge: 2013-11-01 | Disposition: A | Discharge status: Home / Self Care | Payer: Medicare Other | Source: Ambulatory Visit | Attending: Internal Medicine | Admitting: Internal Medicine

## 2013-11-01 DIAGNOSIS — Z794 Long term (current) use of insulin: Secondary | ICD-10-CM | POA: Insufficient documentation

## 2013-11-01 DIAGNOSIS — I2589 Other forms of chronic ischemic heart disease: Secondary | ICD-10-CM | POA: Insufficient documentation

## 2013-11-01 DIAGNOSIS — Z79899 Other long term (current) drug therapy: Secondary | ICD-10-CM | POA: Insufficient documentation

## 2013-11-01 DIAGNOSIS — I5022 Chronic systolic (congestive) heart failure: Secondary | ICD-10-CM | POA: Diagnosis not present

## 2013-11-01 DIAGNOSIS — I252 Old myocardial infarction: Secondary | ICD-10-CM | POA: Diagnosis not present

## 2013-11-01 DIAGNOSIS — I472 Ventricular tachycardia, unspecified: Secondary | ICD-10-CM | POA: Insufficient documentation

## 2013-11-01 DIAGNOSIS — I4729 Other ventricular tachycardia: Secondary | ICD-10-CM | POA: Insufficient documentation

## 2013-11-01 DIAGNOSIS — Z951 Presence of aortocoronary bypass graft: Secondary | ICD-10-CM | POA: Insufficient documentation

## 2013-11-01 DIAGNOSIS — Z95810 Presence of automatic (implantable) cardiac defibrillator: Secondary | ICD-10-CM | POA: Insufficient documentation

## 2013-11-01 DIAGNOSIS — E119 Type 2 diabetes mellitus without complications: Secondary | ICD-10-CM | POA: Insufficient documentation

## 2013-11-01 DIAGNOSIS — Z7982 Long term (current) use of aspirin: Secondary | ICD-10-CM | POA: Insufficient documentation

## 2013-11-01 DIAGNOSIS — I059 Rheumatic mitral valve disease, unspecified: Secondary | ICD-10-CM | POA: Diagnosis not present

## 2013-11-01 DIAGNOSIS — I2510 Atherosclerotic heart disease of native coronary artery without angina pectoris: Secondary | ICD-10-CM | POA: Insufficient documentation

## 2013-11-01 DIAGNOSIS — I129 Hypertensive chronic kidney disease with stage 1 through stage 4 chronic kidney disease, or unspecified chronic kidney disease: Secondary | ICD-10-CM | POA: Diagnosis not present

## 2013-11-01 DIAGNOSIS — N183 Chronic kidney disease, stage 3 unspecified: Secondary | ICD-10-CM | POA: Diagnosis not present

## 2013-11-01 DIAGNOSIS — I509 Heart failure, unspecified: Secondary | ICD-10-CM | POA: Diagnosis not present

## 2013-11-01 HISTORY — PX: RIGHT HEART CATHETERIZATION: SHX5447

## 2013-11-01 LAB — POCT I-STAT 3, VENOUS BLOOD GAS (G3P V)
Acid-Base Excess: 2 mmol/L (ref 0.0–2.0)
BICARBONATE: 27.4 meq/L — AB (ref 20.0–24.0)
Bicarbonate: 25.5 mEq/L — ABNORMAL HIGH (ref 20.0–24.0)
O2 SAT: 59 %
O2 Saturation: 59 %
PH VEN: 7.381 — AB (ref 7.250–7.300)
PO2 VEN: 31 mmHg (ref 30.0–45.0)
PO2 VEN: 32 mmHg (ref 30.0–45.0)
TCO2: 27 mmol/L (ref 0–100)
TCO2: 29 mmol/L (ref 0–100)
pCO2, Ven: 43 mmHg — ABNORMAL LOW (ref 45.0–50.0)
pCO2, Ven: 45.6 mmHg (ref 45.0–50.0)
pH, Ven: 7.387 — ABNORMAL HIGH (ref 7.250–7.300)

## 2013-11-01 LAB — BASIC METABOLIC PANEL
Anion gap: 14 (ref 5–15)
BUN: 36 mg/dL — ABNORMAL HIGH (ref 6–23)
CHLORIDE: 103 meq/L (ref 96–112)
CO2: 24 meq/L (ref 19–32)
CREATININE: 1.65 mg/dL — AB (ref 0.50–1.10)
Calcium: 9.4 mg/dL (ref 8.4–10.5)
GFR calc Af Amer: 39 mL/min — ABNORMAL LOW (ref >90)
GFR calc non Af Amer: 34 mL/min — ABNORMAL LOW (ref >90)
Glucose, Bld: 89 mg/dL (ref 70–99)
Potassium: 4.3 mEq/L (ref 3.7–5.3)
Sodium: 141 mEq/L (ref 137–147)

## 2013-11-01 LAB — PROTIME-INR
INR: 1.18 (ref 0.00–1.49)
Prothrombin Time: 15 seconds (ref 11.6–15.2)

## 2013-11-01 LAB — GLUCOSE, CAPILLARY
GLUCOSE-CAPILLARY: 87 mg/dL (ref 70–99)
Glucose-Capillary: 102 mg/dL — ABNORMAL HIGH (ref 70–99)

## 2013-11-01 LAB — CBC
HCT: 33.4 % — ABNORMAL LOW (ref 36.0–46.0)
Hemoglobin: 10.5 g/dL — ABNORMAL LOW (ref 12.0–15.0)
MCH: 29.5 pg (ref 26.0–34.0)
MCHC: 31.4 g/dL (ref 30.0–36.0)
MCV: 93.8 fL (ref 78.0–100.0)
PLATELETS: 152 10*3/uL (ref 150–400)
RBC: 3.56 MIL/uL — AB (ref 3.87–5.11)
RDW: 16.3 % — ABNORMAL HIGH (ref 11.5–15.5)
WBC: 4.5 10*3/uL (ref 4.0–10.5)

## 2013-11-01 SURGERY — RIGHT HEART CATH
Anesthesia: LOCAL

## 2013-11-01 MED ORDER — ONDANSETRON HCL 4 MG/2ML IJ SOLN: 4.0000 mg | Freq: Four times a day (QID) | INTRAMUSCULAR | Status: DC | PRN

## 2013-11-01 MED ORDER — MIDAZOLAM HCL 2 MG/2ML IJ SOLN
INTRAMUSCULAR | Status: AC
  Filled 2013-11-01: qty 2

## 2013-11-01 MED ORDER — SODIUM CHLORIDE 0.9 % IJ SOLN: 3.0000 mL | INTRAMUSCULAR | Status: DC | PRN

## 2013-11-01 MED ORDER — FENTANYL CITRATE 0.05 MG/ML IJ SOLN
INTRAMUSCULAR | Status: AC
  Filled 2013-11-01: qty 2

## 2013-11-01 MED ORDER — LIDOCAINE HCL (PF) 1 % IJ SOLN
INTRAMUSCULAR | Status: AC
  Filled 2013-11-01: qty 30

## 2013-11-01 MED ORDER — SODIUM CHLORIDE 0.9 % IV SOLN: 250.0000 mL | INTRAVENOUS | Status: DC | PRN

## 2013-11-01 MED ORDER — SODIUM CHLORIDE 0.9 % IV SOLN
INTRAVENOUS | Status: DC
  Administered 2013-11-01: 08:00:00 via INTRAVENOUS

## 2013-11-01 MED ORDER — NITROGLYCERIN 1 MG/10 ML FOR IR/CATH LAB
INTRA_ARTERIAL | Status: AC
  Filled 2013-11-01: qty 10

## 2013-11-01 MED ORDER — ACETAMINOPHEN 325 MG PO TABS: 650.0000 mg | ORAL_TABLET | ORAL | Status: DC | PRN

## 2013-11-01 MED ORDER — SODIUM CHLORIDE 0.9 % IJ SOLN: 3.0000 mL | Freq: Two times a day (BID) | INTRAMUSCULAR | Status: DC

## 2013-11-01 MED ORDER — HEPARIN (PORCINE) IN NACL 2-0.9 UNIT/ML-% IJ SOLN
INTRAMUSCULAR | Status: AC
  Filled 2013-11-01: qty 500

## 2013-11-01 NOTE — H&P (View-Only) (Signed)
Patient ID: [name], female   DOB: 1955/04/27, 58 y.o.   MRN: [id number]  PCP: Dr. Nicoletta Dress (Internal Medicine)  HPI: 58 year old female with history of HTN, DM2, past polysubstance abuse (ETOH, tobacco, cocaine), CAD s/p CABG x5 with MV annuloplasty (2007), ICM s/p ICD, chronic systolic HF, severe MR, CKD stage III and PAD.   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 At time of cath LAD, LCX and RCA occluded. EF 45%. Had abrupt stent occlusion the next day and had to go back to the lab. 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   Echos: 05/19/2012  EF 15% 2/15 EF 15%, diffuse hypokinesis, restrictive diastolic function, s/p MV repair with moderate-severe MR.   CPX: 06/17/12 Peak VO2 14.8 (predicted peak VO2 68.5%), VE/VCO2 slope 43.4 OUES: 1.19, Peak RER 1.12 Vent threshold 11.3 (pred peak VO2 52.3%)  CPX (3/15): peak VO2 15.6 (predicted peak VO2 74.5%) VE/VO2 40.8, RER 1.2  RHC (3/15): Mean RA 7 PA 60/22, mean 38 Mean PCWP 23 with prominent v-waves CI 2.5 PVR 3.8  R/LHC (10/2013) RA = 5  RV = 423/2/5  PA = 25/6 (14)  PCW = 12  Fick cardiac output/index = 3.2/1.7 (duing junctional rhythm)  PVR =0.7 [place]  FA sat = 95%  PA sat = 45%, 47%  PA sat = 55% (with levophed 5 at end of case) 1) severe native CAD with all bypass grafts patent  Admitted 8/2-8/7 for syncope s/p ICD shock. Concern that VT was possibly from ischemia and taken for [facility]. During cath patient had vagal response and BP dropped to 50s and co-ox was in the upper 40s. Placed on Levophed and transferred to floor. Started on Amiodarone.   [facility]: Since being home has noticed more dizziness. She reports dizziness everyday. She was supposed to stop coreg, however she has been taking. Occasional SOB. Denies palpitations, CP, edema or orthopnea. Weight at home 179 lbs. Able to walk about a block before having to  stop. Following a low salt diet and drinking less than 2L a day.   Labs:  08/12/12 - on lipitor 40 mg daily Cholesterol 180 Triglyceride 195 HDL 29 LDL 112 K 4.5, creatinine 1.4 11/16/12 K 4.0 Labs 1.5 Pro BNP 181 02/14/13: K+ 4.6, Cr 1.33, Dig level 1.8, TSH 2.97 3/15: K 4.2, creatinine 1.07, HCT 32.4 06/2013: Dig level 0.3, pro-BNP 899, Cholesterol 140, TG 106, HDL 36, LDL 82, Cr 1.5, K+ 4.9  FH: Mother deceased: CAD, DM2, HTN        Father deceased: stroke  SH: Works odds/end jobs for [employer]; disabled. Lives in [place] with 2 sons.   ROS: All systems negative except as listed in HPI, PMH and Problem List.  Past Medical History  Diagnosis Date  . Coronary artery disease     a. s/p CABG x 5 with MV annuloplasty 2007   . Diabetes mellitus   . Hypertension   . Chronic systolic heart failure     a. ICM b. ECHO (06/2013): EF 15%, severe MR (06/2013) c. RHC (10/2013): RA 5, RV 23/2/5, PA 25/6 (14), PCWP 12, Fick CO/CI: 3.2/1.7, PVR 0.7 WU, PA 45%, 47% and 55% (with levophed 5), vagal response during cath   . Polysubstance abuse     history of  (cocaine, tobacco and ETOH)  . V-tach   . Ischemic cardiomyopathy  a. s/p ICD b. LHC (10/04/13): 1. Severe native CAD with all bypass grafts patent      Current Outpatient Prescriptions  Medication Sig Dispense Refill  . allopurinol (ZYLOPRIM) 100 MG tablet Take 100-200 mg by mouth 2 (two) times daily. Takes 200 mg in the morning and 100 mg in the evening      . amiodarone (PACERONE) 400 MG tablet Take 1 tablet (400 mg total) by mouth 2 (two) times daily.  60 tablet  3  . aspirin EC 81 MG tablet Take 81 mg by mouth daily.      [name] atorvastatin (LIPITOR) 80 MG tablet Take 1 tablet (80 mg total) by mouth daily.  30 tablet  11  . carvedilol (COREG) 6.25 MG tablet Take 6.25 mg by mouth 2 (two) times daily with a meal.      . digoxin (LANOXIN) 0.125 MG tablet Take 0.5 tablets (0.0625 mg total) by mouth every other day.  8 tablet  3  . furosemide  (LASIX) 40 MG tablet 80 mg (2 tablets) in the am and 40 mg (1 tablet) in the pm      . insulin glargine (LANTUS) 100 UNIT/ML injection Inject 38 Units into the skin at bedtime.      . insulin lispro (HUMALOG) 100 UNIT/ML injection Inject 5-8 Units into the skin 3 (three) times daily before meals.      [name] lisinopril (PRINIVIL,ZESTRIL) 5 MG tablet Take 0.5 tablets (2.5 mg total) by mouth at bedtime.  15 tablet  3  . nitroGLYCERIN (NITROSTAT) 0.4 MG SL tablet Place 1 tablet (0.4 mg total) under the tongue every 5 (five) minutes as needed. For chest pain.  25 tablet  11  . potassium chloride SA (K-DUR,KLOR-CON) 20 MEQ tablet Take 1 tablet (20 mEq total) by mouth daily.  30 tablet  9  . spironolactone (ALDACTONE) 25 MG tablet Take 0.5 tablets (12.5 mg total) by mouth daily.  15 tablet  3  . [DISCONTINUED] sitaGLIPtin (JANUVIA) 25 MG tablet Take 1 tablet (25 mg total) by mouth daily.  30 tablet  1   No current facility-administered medications for this encounter.    Filed Vitals:   10/14/13 0951  BP: 98/50  Pulse: 66  Resp: 16  Weight: 181 lb 2 oz (82.158 kg)  SpO2: 98%   PHYSICAL EXAM: General:  Well appearing. No resp difficulty HEENT: normal Neck: supple. JVP flat.  Carotids 2+ bilaterally; no bruits. No lymphadenopathy or thryomegaly appreciated. Cor: PMI normal. Regular rate & rhythm. 3/6 HSM apex.  Lungs: clear Abdomen: soft, nontender, mildly distended. No hepatosplenomegaly. No bruits or masses. Good bowel sounds. Extremities: no cyanosis, clubbing, rash, edema Neuro: alert & orientedx3, cranial nerves grossly intact. Moves all 4 extremities w/o difficulty. Affect pleasant.   ASSESSMENT & PLAN:  1. Chronic Systolic Heart Failure: Ischemic cardiomyopathy. EF 15% (4/15). She has a Medtronic ICD that does not have Optivol.  - Reviewed discharge summary and R/LHC.  - NYHA IIIb symptoms and volume status stable. I think she maybe a little dry will cut lasix back to 80 mg daily and told  to take an extra 40 mg in the afternoons only if weight greater than 3 lbs in a day or 5 lbs in a week.  - She was supposed to stop coreg after admission but continued daily. She is having every day dizziness. Will stop coreg. - Continue lisinopril, digoxin and spironolactone at current doses. - Low threshold to repeat RHC if she starts feeling worse  and lengthy discussion with patient to call if increased SOB, dizziness or fatigue. - Will send all her information to [facility] for tx evaluation and will set her up for PFTs and surgical consult. - Reinforced the need and importance of daily weights, a low sodium diet, and fluid restriction (less than 2 L a day). Instructed to call the HF clinic if weight increases more than 3 lbs overnight or 5 lbs in a week.  2. CAD: No chest pain.  Continue ASA, statin and ACE-I 3. Mitral regurgitation: Patient had mitral valve annuloplasty with CABG.  She has a significant MR murmur on exam. Moderate to severe MR on echo in 4/15. TEE (06/2013) mitral regurg appears severe, anatomy of repaired valve does not look suitable for MV clipping and would be high risk for MV replacement. Continue to follow.  4. Hx drug abuse: Remains abstinent from drug use and ETOH for >2 years. Congratulated the patient.   5. CKD stage III - baseline Cr 1.5. Check BMET today.  6. NSVT - denies any palpitations and now is on amiodarone 400 mg BID. Will continue 400 mg BID and at next visit will cut back to 200 mg BID.  F/U 1 month Rande Brunt 10/14/2013

## 2013-11-01 NOTE — Discharge Instructions (Signed)
CALL DR BENSIMHON'S OFFICE IF ANY PROBLEMS,QUESTIONS, OR CONCERNS; CALL IF ANY BLEEDING,DRAINAGE,FEVER,PAIN,SWELLING, OR REDNESS AT RIGHT NECK SITE

## 2013-11-01 NOTE — Progress Notes (Signed)
Instructions reviewed w/patient. Drinking apple juice

## 2013-11-01 NOTE — Interval H&P Note (Signed)
History and Physical Interval Note:  11/01/2013 9:05 AM  [name]  has presented today for surgery, with the diagnosis of hf  The various methods of treatment have been discussed with the patient and family. After consideration of risks, benefits and other options for treatment, the patient has consented to  Procedure(s): RIGHT HEART CATH (N/A) as a surgical intervention .  The patient's history has been reviewed, patient examined, no change in status, stable for surgery.  I have reviewed the patient's chart and labs.  Questions were answered to the patient's satisfaction.     [name]

## 2013-11-01 NOTE — CV Procedure (Signed)
Cardiac Cath Procedure Note:  Indication:  HF  Procedures performed:  1) Right heart catheterization  Description of procedure:   The risks and indication of the procedure were explained. Consent was signed and placed on the chart. An appropriate timeout was taken prior to the procedure. The right neck was prepped and draped in the routine sterile fashion and anesthetized with 1% local lidocaine.   A 7 FR venous sheath was placed in the right internal jugular vein using a modified Seldinger technique. A standard Swan-Ganz catheter was used for the procedure.   Complications: None apparent.  Findings:  RA = 5 RV = 47/3/10 PA = 54/16 (30) PCW = 14 v = 20 Fick cardiac output/index = 4.8/2.6 Thermo CO/CI = 3.7/2.0 PVR = 4.3 WU FA sat = 95% PA sat = 59%, 59%  Assessment: 1. Well compensated filling pressures 2. Moderately depressed CO  Plan/Discussion:  Continue transplant evaluation. No need for VAD w/u at this time.   Glori Bickers MD 9:31 AM

## 2013-11-02 ENCOUNTER — Telehealth (HOSPITAL_COMMUNITY): Payer: Self-pay | Admitting: Vascular Surgery

## 2013-11-02 DIAGNOSIS — I5022 Chronic systolic (congestive) heart failure: Secondary | ICD-10-CM

## 2013-11-02 MED ORDER — LISINOPRIL 5 MG PO TABS: 2.5000 mg | ORAL_TABLET | Freq: Every day | ORAL | Status: DC

## 2013-11-02 MED ORDER — DIGOXIN 125 MCG PO TABS
0.0625 mg | ORAL_TABLET | ORAL | Status: DC
Start: 2013-11-02 — End: 2013-11-24

## 2013-11-02 MED ORDER — POTASSIUM CHLORIDE CRYS ER 20 MEQ PO TBCR: 20.0000 meq | EXTENDED_RELEASE_TABLET | Freq: Every day | ORAL | Status: DC

## 2013-11-02 NOTE — Telephone Encounter (Signed)
Refill Potassium, Digoxin, Lisinopril

## 2013-11-02 NOTE — Telephone Encounter (Signed)
As requested refill sent into pharmacy

## 2013-11-03 ENCOUNTER — Telehealth (HOSPITAL_COMMUNITY): Payer: Self-pay | Admitting: *Deleted

## 2013-11-03 NOTE — Telephone Encounter (Signed)
Called pt to inform her of PFT's scheduled 11/15/13 at 12:00 (following her Heart Failure clinic appt). Instructed no caffeine, inhalers, or breathing treatments for 4 hours prior to test. Pt has appt with [facility] transplant team 9-9/29/15; mammogram scheduled 11/17/13. We will CD's made of her CT scans and give to patient so she can take with her to her appt at [facility]. Pt verbalized understanding of above.

## 2013-11-08 ENCOUNTER — Telehealth (HOSPITAL_COMMUNITY): Payer: Self-pay | Admitting: Surgery

## 2013-11-08 DIAGNOSIS — I5022 Chronic systolic (congestive) heart failure: Secondary | ICD-10-CM

## 2013-11-08 NOTE — Telephone Encounter (Signed)
Ms. Schlachter called concerned about still feeling SOB.  Per Junie Bame she can take an additional Lasix today and needs to have a BMET on Thursday at [facility].  Patient is aware and agreeable.

## 2013-11-09 NOTE — Progress Notes (Signed)
INTERNAL MEDICINE TEACHING ATTENDING ADDENDUM - Aldine Contes, MD: I personally saw and evaluated Ms. Minervini in this clinic visit in conjunction with the resident, Dr. Trudee Kuster. I have discussed patient's plan of care with medical resident during this visit. I have confirmed the physical exam findings and have read and agree with the clinic note including the plan with the following addition: - Pt with likely PND- start flonase and saline nasal spray - Pt to f/u with [facility] for possible cardiac transplant. Will likely need LVAD placement as bridge to transplant - c/w current cardiac meds - DM well controlled. C/w current meds

## 2013-11-11 ENCOUNTER — Other Ambulatory Visit (INDEPENDENT_AMBULATORY_CARE_PROVIDER_SITE_OTHER): Payer: Medicare Other

## 2013-11-11 DIAGNOSIS — I509 Heart failure, unspecified: Secondary | ICD-10-CM | POA: Diagnosis not present

## 2013-11-11 DIAGNOSIS — I5022 Chronic systolic (congestive) heart failure: Secondary | ICD-10-CM

## 2013-11-11 LAB — BASIC METABOLIC PANEL
BUN: 37 mg/dL — AB (ref 6–23)
CHLORIDE: 102 meq/L (ref 96–112)
CO2: 28 mEq/L (ref 19–32)
Calcium: 9.7 mg/dL (ref 8.4–10.5)
Creatinine, Ser: 1.8 mg/dL — ABNORMAL HIGH (ref 0.4–1.2)
GFR: 36.45 mL/min — AB (ref >60.00)
Glucose, Bld: 89 mg/dL (ref 70–99)
Potassium: 4.1 mEq/L (ref 3.5–5.1)
Sodium: 139 mEq/L (ref 135–145)

## 2013-11-14 NOTE — Progress Notes (Signed)
Patient ID: [name], female   DOB: [birth year], 58 y.o.   MRN: [id number]  PCP: Dr. Nicoletta Dress (Internal Medicine)  HPI: 58 year old female with history of HTN, DM2, past polysubstance abuse (ETOH, tobacco, cocaine), CAD s/p CABG x5 with MV annuloplasty (2007), ICM s/p ICD, chronic systolic HF, severe MR, CKD stage III and PAD.   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 At time of cath LAD, LCX and RCA occluded. EF 45%. Had abrupt stent occlusion the next day and had to go back to the lab. 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   Admitted 8/2-8/7 for syncope s/p ICD shock. Concern that VT was possibly from ischemia and taken for [facility]. During cath patient had vagal response and BP dropped to 50s and co-ox was in the upper 40s. Placed on Levophed and transferred to floor. Started on Amiodarone.   She returns for follow up with his sister. Overall feeling ok. Does admit to SOB with dyspnea. Weight at home 180- trending up to 184 pounds. Trying to work part time. Taking all medications. Follow low salt diet and limiting fluid. Says she has been drinking extra fluid. She has follow up at [facility]. 11/29/13.   Echos: 05/19/2012  EF 15% 2/15 EF 15%, diffuse hypokinesis, restrictive diastolic function, s/p MV repair with moderate-severe MR.   CPX: 06/17/12 Peak VO2 14.8 (predicted peak VO2 68.5%), VE/VCO2 slope 43.4 OUES: 1.19, Peak RER 1.12 Vent threshold 11.3 (pred peak VO2 52.3%)  CPX (3/15): peak VO2 15.6 (predicted peak VO2 74.5%) VE/VO2 40.8, RER 1.2  RHC (3/15): Mean RA 7 PA 60/22, mean 38 Mean PCWP 23 with prominent v-waves CI 2.5 PVR 3.8  R/LHC (10/2013) RA = 5  RV = 423/2/5  PA = 25/6 (14)  PCW = 12  Fick cardiac output/index = 3.2/1.7 (duing junctional rhythm)  PVR =0.7 Woods  FA sat = 95%  PA sat = 45%, 47%  PA sat = 55% (with levophed 5 at end of case) 1) severe native CAD with all bypass  grafts patent  11/01/13 RHC RA = 5  RV = 47/3/10  PA = 54/16 (30)  PCW = 14 v = 20  Fick cardiac output/index = 4.8/2.6  Thermo CO/CI = 3.7/2.0  PVR = 4.3 WU  FA sat = 95%  PA sat = 59%, 59%   Labs:  08/12/12 - on lipitor 40 mg daily Cholesterol 180 Triglyceride 195 HDL 29 LDL 112 K 4.5, creatinine 1.4 11/16/12 K 4.0 Labs 1.5 Pro BNP 181 02/14/13: K+ 4.6, Cr 1.33, Dig level 1.8, TSH 2.97 3/15: K 4.2, creatinine 1.07, HCT 32.4 06/2013: Dig level 0.3, pro-BNP 899, Cholesterol 140, TG 106, HDL 36, LDL 82, Cr 1.5, K+ 4.9 11/11/13: K 4.1 Creatinine 1.8   FH: Mother deceased: CAD, DM2, HTN        Father deceased: stroke  SH: Works odds/end jobs for [employer]; disabled. Lives in [place] with 2 sons.   ROS: All systems negative except as listed in HPI, PMH and Problem List.  Past Medical History  Diagnosis Date  . Coronary artery disease     a. s/p CABG x 5 with MV annuloplasty 2007   . Diabetes mellitus   . Hypertension   . Chronic systolic heart failure     a. ICM b. ECHO (06/2013): EF 15%, severe MR (06/2013) c. RHC (10/2013): RA 5, RV 23/2/5,  PA 25/6 (14), PCWP 12, Fick CO/CI: 3.2/1.7, PVR 0.7 WU, PA 45%, 47% and 55% (with levophed 5), vagal response during cath   . Polysubstance abuse     history of  (cocaine, tobacco and ETOH)  . V-tach   . Ischemic cardiomyopathy     a. s/p ICD b. LHC (10/04/13): 1. Severe native CAD with all bypass grafts patent      Current Outpatient Prescriptions  Medication Sig Dispense Refill  . allopurinol (ZYLOPRIM) 100 MG tablet Take 100-200 mg by mouth 2 (two) times daily. Takes 200 mg in the morning and 100 mg in the evening      . amiodarone (PACERONE) 400 MG tablet Take 400 mg by mouth 2 (two) times daily.       [name] amoxicillin (AMOXIL) 500 MG tablet Take 4 tablets (2,000 mg total) by mouth as needed. 1 hour prior to dental work  4 tablet  6  . aspirin EC 81 MG tablet Take 81 mg by mouth daily.      [name] atorvastatin (LIPITOR) 80 MG tablet Take 1  tablet (80 mg total) by mouth daily.  30 tablet  11  . digoxin (LANOXIN) 0.125 MG tablet Take 0.5 tablets (0.0625 mg total) by mouth every other day.  8 tablet  3  . diphenhydrAMINE (BENADRYL) 25 mg capsule Take 25-50 mg by mouth at bedtime as needed for allergies.      . fluticasone (FLONASE) 50 MCG/ACT nasal spray Place 1 spray into both nostrils daily.  16 g  2  . furosemide (LASIX) 40 MG tablet 80 mg daily. 80 mg (2 tablets) in the am. If weight increases more than 3 lbs day or 5 lbs week take extra 40 mg (1 tablet)      . insulin glargine (LANTUS) 100 UNIT/ML injection Inject 0.3 mLs (30 Units total) into the skin at bedtime.  10 mL  11  . insulin lispro (HUMALOG) 100 UNIT/ML injection Inject 6 Units into the skin 3 (three) times daily as needed for high blood sugar.      . lisinopril (PRINIVIL,ZESTRIL) 5 MG tablet Take 0.5 tablets (2.5 mg total) by mouth at bedtime.  15 tablet  3  . nitroGLYCERIN (NITROSTAT) 0.4 MG SL tablet Place 1 tablet (0.4 mg total) under the tongue every 5 (five) minutes as needed. For chest pain.  25 tablet  11  . potassium chloride SA (K-DUR,KLOR-CON) 20 MEQ tablet Take 1 tablet (20 mEq total) by mouth daily.  30 tablet  9  . [DISCONTINUED] sitaGLIPtin (JANUVIA) 25 MG tablet Take 1 tablet (25 mg total) by mouth daily.  30 tablet  1   No current facility-administered medications for this encounter.    Filed Vitals:   11/15/13 1123  BP: 100/64  Pulse: 68  Resp: 18  Weight: 184 lb 6 oz (83.632 kg)  SpO2: 98%   PHYSICAL EXAM: General:  Well appearing. No resp difficulty Sister present  HEENT: normal Neck: supple. JVP 8-9.  Carotids 2+ bilaterally; no bruits. No lymphadenopathy or thryomegaly appreciated. Cor: PMI normal. Regular rate & rhythm. 3/6 HSM apex.  Lungs: clear Abdomen: soft, nontender, mildly distended. No hepatosplenomegaly. No bruits or masses. Good bowel sounds. Extremities: no cyanosis, clubbing, rash, edema Neuro: alert & orientedx3, cranial  nerves grossly intact. Moves all 4 extremities w/o difficulty. Affect pleasant.   ASSESSMENT & PLAN:  1. Chronic Systolic Heart Failure: Ischemic cardiomyopathy. EF 15% (4/15). She has a [employer] ICD that does not have Optivol. - -  NYHA IIIb symptoms and volume status mildly elevated likely due to increase fluid intake. Continue lasix 80 mg daily and she is instructed to take an extra 40 mg of lasix today and tomorrow with an extra 20 meq of potassium . Continue 12.5 mg spironolactone daily.  -Intolerant coreg due to dizziness.   - Continue  Lisinopril  1.25 mg twice a day. She has been intolerant up-titration due to dizziness -Continue 0.0625 mg digoxin. (dig level < 0.3 on 10/05/13)  PFTs pending today. Has follow up with Dr Radene Knee at [facility] 11/25/13. Records sent.  - Reinforced the need and importance of daily weights, a low sodium diet, and fluid restriction (less than 2 L a day). Instructed to call the HF clinic if weight increases more than 3 lbs overnight or 5 lbs in a week.  2. CAD: No chest pain.  Continue ASA, statin and ACE-I 3. Mitral regurgitation: S/P  mitral valve annuloplasty with CABG.  Significant MR murmur on exam. Moderate to severe MR on echo in 4/15. TEE (06/2013) mitral regurg appears severe, anatomy of repaired valve does not look suitable for MV clipping and would be high risk for MV replacement. Continue to follow.  4. Hx drug abuse: Remains abstinent from drug use and ETOH for >2 years. 5. CKD stage III- baseline Cr 1.5.  6. NSVT- denies any palpitations . Cut back amiodarone to 200 mg twice a day. Next visit will need to check TSH. Needs yearly eye exams. PFTs scheduled for later today,.     Follow up .4 weeks.  CLEGG,AMY NP-C  11/15/2013

## 2013-11-15 ENCOUNTER — Ambulatory Visit (HOSPITAL_COMMUNITY)
Admission: RE | Admit: 2013-11-15 | Discharge: 2013-11-15 | Disposition: A | Discharge status: Home / Self Care | Payer: Medicare Other | Source: Ambulatory Visit | Attending: Cardiology | Admitting: Cardiology

## 2013-11-15 ENCOUNTER — Encounter (HOSPITAL_COMMUNITY): Payer: Self-pay

## 2013-11-15 ENCOUNTER — Ambulatory Visit (HOSPITAL_COMMUNITY)
Admission: RE | Admit: 2013-11-15 | Discharge: 2013-11-15 | Disposition: A | Discharge status: Home / Self Care | Payer: Medicare Other | Source: Ambulatory Visit | Attending: Internal Medicine | Admitting: Internal Medicine

## 2013-11-15 VITALS — BP 100/64 | HR 68 | Resp 18 | Wt 184.4 lb

## 2013-11-15 DIAGNOSIS — I509 Heart failure, unspecified: Secondary | ICD-10-CM | POA: Diagnosis not present

## 2013-11-15 DIAGNOSIS — I2510 Atherosclerotic heart disease of native coronary artery without angina pectoris: Secondary | ICD-10-CM | POA: Insufficient documentation

## 2013-11-15 DIAGNOSIS — F1911 Other psychoactive substance abuse, in remission: Secondary | ICD-10-CM | POA: Insufficient documentation

## 2013-11-15 DIAGNOSIS — F1011 Alcohol abuse, in remission: Secondary | ICD-10-CM | POA: Insufficient documentation

## 2013-11-15 DIAGNOSIS — E119 Type 2 diabetes mellitus without complications: Secondary | ICD-10-CM | POA: Diagnosis not present

## 2013-11-15 DIAGNOSIS — Z794 Long term (current) use of insulin: Secondary | ICD-10-CM | POA: Diagnosis not present

## 2013-11-15 DIAGNOSIS — Z9889 Other specified postprocedural states: Secondary | ICD-10-CM | POA: Insufficient documentation

## 2013-11-15 DIAGNOSIS — Z7982 Long term (current) use of aspirin: Secondary | ICD-10-CM | POA: Insufficient documentation

## 2013-11-15 DIAGNOSIS — I4729 Other ventricular tachycardia: Secondary | ICD-10-CM | POA: Insufficient documentation

## 2013-11-15 DIAGNOSIS — I2589 Other forms of chronic ischemic heart disease: Secondary | ICD-10-CM | POA: Diagnosis not present

## 2013-11-15 DIAGNOSIS — Z01818 Encounter for other preprocedural examination: Secondary | ICD-10-CM

## 2013-11-15 DIAGNOSIS — Z888 Allergy status to other drugs, medicaments and biological substances status: Secondary | ICD-10-CM | POA: Insufficient documentation

## 2013-11-15 DIAGNOSIS — R0989 Other specified symptoms and signs involving the circulatory and respiratory systems: Secondary | ICD-10-CM | POA: Diagnosis not present

## 2013-11-15 DIAGNOSIS — I472 Ventricular tachycardia, unspecified: Secondary | ICD-10-CM | POA: Insufficient documentation

## 2013-11-15 DIAGNOSIS — Z95810 Presence of automatic (implantable) cardiac defibrillator: Secondary | ICD-10-CM | POA: Insufficient documentation

## 2013-11-15 DIAGNOSIS — Z87891 Personal history of nicotine dependence: Secondary | ICD-10-CM | POA: Diagnosis not present

## 2013-11-15 DIAGNOSIS — R0609 Other forms of dyspnea: Secondary | ICD-10-CM | POA: Insufficient documentation

## 2013-11-15 DIAGNOSIS — I059 Rheumatic mitral valve disease, unspecified: Secondary | ICD-10-CM | POA: Diagnosis not present

## 2013-11-15 DIAGNOSIS — I5022 Chronic systolic (congestive) heart failure: Secondary | ICD-10-CM | POA: Diagnosis not present

## 2013-11-15 DIAGNOSIS — R0602 Shortness of breath: Secondary | ICD-10-CM | POA: Insufficient documentation

## 2013-11-15 DIAGNOSIS — I739 Peripheral vascular disease, unspecified: Secondary | ICD-10-CM | POA: Diagnosis not present

## 2013-11-15 DIAGNOSIS — N183 Chronic kidney disease, stage 3 unspecified: Secondary | ICD-10-CM | POA: Insufficient documentation

## 2013-11-15 DIAGNOSIS — Z79899 Other long term (current) drug therapy: Secondary | ICD-10-CM | POA: Insufficient documentation

## 2013-11-15 DIAGNOSIS — Z951 Presence of aortocoronary bypass graft: Secondary | ICD-10-CM | POA: Diagnosis not present

## 2013-11-15 DIAGNOSIS — I129 Hypertensive chronic kidney disease with stage 1 through stage 4 chronic kidney disease, or unspecified chronic kidney disease: Secondary | ICD-10-CM | POA: Diagnosis not present

## 2013-11-15 LAB — PULMONARY FUNCTION TEST
DL/VA % pred: 69 %
DL/VA: 3.32 ml/min/mmHg/L
DLCO COR % PRED: 51 %
DLCO COR: 12.61 ml/min/mmHg
DLCO unc % pred: 51 %
DLCO unc: 12.61 ml/min/mmHg
FEF 25-75 POST: 2.67 L/s
FEF 25-75 PRE: 2.27 L/s
FEF2575-%CHANGE-POST: 17 %
FEF2575-%PRED-PRE: 102 %
FEF2575-%Pred-Post: 120 %
FEV1-%Change-Post: 3 %
FEV1-%Pred-Post: 92 %
FEV1-%Pred-Pre: 89 %
FEV1-POST: 2.02 L
FEV1-PRE: 1.95 L
FEV1FVC-%CHANGE-POST: 4 %
FEV1FVC-%PRED-PRE: 102 %
FEV6-%CHANGE-POST: 0 %
FEV6-%Pred-Post: 87 %
FEV6-%Pred-Pre: 88 %
FEV6-Post: 2.34 L
FEV6-Pre: 2.37 L
FEV6FVC-%Change-Post: 0 %
FEV6FVC-%Pred-Post: 102 %
FEV6FVC-%Pred-Pre: 102 %
FVC-%CHANGE-POST: 0 %
FVC-%PRED-POST: 86 %
FVC-%Pred-Pre: 86 %
FVC-Post: 2.37 L
FVC-Pre: 2.38 L
POST FEV1/FVC RATIO: 85 %
PRE FEV1/FVC RATIO: 82 %
Post FEV6/FVC ratio: 100 %
Pre FEV6/FVC Ratio: 99 %
RV % pred: 93 %
RV: 1.78 L
TLC % pred: 85 %
TLC: 4.33 L

## 2013-11-15 MED ORDER — AMIODARONE HCL 200 MG PO TABS: 200.0000 mg | ORAL_TABLET | Freq: Two times a day (BID) | ORAL | Status: DC

## 2013-11-15 MED ORDER — ALBUTEROL SULFATE (2.5 MG/3ML) 0.083% IN NEBU
2.5000 mg | INHALATION_SOLUTION | Freq: Once | RESPIRATORY_TRACT | Status: AC
  Administered 2013-11-15: 2.5 mg via RESPIRATORY_TRACT

## 2013-11-15 NOTE — Patient Instructions (Signed)
Follow up in 1 month   Take an extra lasix 40 mg today and tomorrow with and 20 meq of potassium today and tomorrow.   Do the following things EVERYDAY: 1) Weigh yourself in the morning before breakfast. Write it down and keep it in a log. 2) Take your medicines as prescribed 3) Eat low salt foods-Limit salt (sodium) to 2000 mg per day.  4) Stay as active as you can everyday 5) Limit all fluids for the day to less than 2 liters

## 2013-11-17 ENCOUNTER — Ambulatory Visit (HOSPITAL_COMMUNITY): Payer: Medicare Other

## 2013-11-22 ENCOUNTER — Ambulatory Visit (HOSPITAL_COMMUNITY)
Admission: RE | Admit: 2013-11-22 | Discharge: 2013-11-22 | Disposition: A | Discharge status: Home / Self Care | Payer: Medicare Other | Source: Ambulatory Visit | Attending: Internal Medicine | Admitting: Internal Medicine

## 2013-11-22 DIAGNOSIS — I472 Ventricular tachycardia: Secondary | ICD-10-CM | POA: Diagnosis present

## 2013-11-22 DIAGNOSIS — M109 Gout, unspecified: Secondary | ICD-10-CM | POA: Diagnosis present

## 2013-11-22 DIAGNOSIS — I129 Hypertensive chronic kidney disease with stage 1 through stage 4 chronic kidney disease, or unspecified chronic kidney disease: Secondary | ICD-10-CM | POA: Diagnosis present

## 2013-11-22 DIAGNOSIS — I5023 Acute on chronic systolic (congestive) heart failure: Secondary | ICD-10-CM | POA: Diagnosis present

## 2013-11-22 DIAGNOSIS — Z87891 Personal history of nicotine dependence: Secondary | ICD-10-CM | POA: Diagnosis not present

## 2013-11-22 DIAGNOSIS — I447 Left bundle-branch block, unspecified: Secondary | ICD-10-CM | POA: Diagnosis present

## 2013-11-22 DIAGNOSIS — I498 Other specified cardiac arrhythmias: Secondary | ICD-10-CM | POA: Diagnosis present

## 2013-11-22 DIAGNOSIS — Z951 Presence of aortocoronary bypass graft: Secondary | ICD-10-CM | POA: Diagnosis not present

## 2013-11-22 DIAGNOSIS — E119 Type 2 diabetes mellitus without complications: Secondary | ICD-10-CM | POA: Diagnosis present

## 2013-11-22 DIAGNOSIS — Z95810 Presence of automatic (implantable) cardiac defibrillator: Secondary | ICD-10-CM | POA: Diagnosis not present

## 2013-11-22 DIAGNOSIS — I2589 Other forms of chronic ischemic heart disease: Secondary | ICD-10-CM | POA: Diagnosis present

## 2013-11-22 DIAGNOSIS — R0602 Shortness of breath: Secondary | ICD-10-CM | POA: Diagnosis present

## 2013-11-22 DIAGNOSIS — Z1231 Encounter for screening mammogram for malignant neoplasm of breast: Secondary | ICD-10-CM

## 2013-11-22 DIAGNOSIS — I252 Old myocardial infarction: Secondary | ICD-10-CM | POA: Diagnosis not present

## 2013-11-22 DIAGNOSIS — I4729 Other ventricular tachycardia: Secondary | ICD-10-CM | POA: Diagnosis present

## 2013-11-22 DIAGNOSIS — I2510 Atherosclerotic heart disease of native coronary artery without angina pectoris: Secondary | ICD-10-CM | POA: Diagnosis present

## 2013-11-22 DIAGNOSIS — N183 Chronic kidney disease, stage 3 unspecified: Secondary | ICD-10-CM | POA: Diagnosis present

## 2013-11-22 DIAGNOSIS — F1410 Cocaine abuse, uncomplicated: Secondary | ICD-10-CM | POA: Diagnosis present

## 2013-11-22 DIAGNOSIS — Z0000 Encounter for general adult medical examination without abnormal findings: Secondary | ICD-10-CM

## 2013-11-22 DIAGNOSIS — I509 Heart failure, unspecified: Secondary | ICD-10-CM | POA: Diagnosis present

## 2013-11-23 ENCOUNTER — Telehealth (HOSPITAL_COMMUNITY): Payer: Self-pay | Admitting: Vascular Surgery

## 2013-11-23 ENCOUNTER — Other Ambulatory Visit (INDEPENDENT_AMBULATORY_CARE_PROVIDER_SITE_OTHER): Payer: Medicare Other

## 2013-11-23 DIAGNOSIS — Z1211 Encounter for screening for malignant neoplasm of colon: Secondary | ICD-10-CM

## 2013-11-23 LAB — POC HEMOCCULT BLD/STL (HOME/3-CARD/SCREEN)
Card #2 Fecal Occult Blod, POC: POSITIVE
Card #3 Fecal Occult Blood, POC: NEGATIVE
FECAL OCCULT BLD: POSITIVE

## 2013-11-23 NOTE — Telephone Encounter (Signed)
Returned call to patient.  Patient states since decreasing her amiodarone at last week's appointment, she has felt increasingly fatigued, no energy, "just bad all over" and feels like her HR is slow.  Weight is stable.  Added patient on to tomorrow's schedule to make sure she is still NSR.  Patient aware, agreeable, and appreciative.

## 2013-11-23 NOTE — Telephone Encounter (Signed)
Pt is feeling sick she does not have any energy.. Please advise asap

## 2013-11-23 NOTE — Addendum Note (Signed)
Addended by: Truddie Crumble on: 11/23/2013 04:20 PM   Modules accepted: Orders

## 2013-11-24 ENCOUNTER — Ambulatory Visit (HOSPITAL_BASED_OUTPATIENT_CLINIC_OR_DEPARTMENT_OTHER)
Admission: RE | Admit: 2013-11-24 | Discharge: 2013-11-24 | Disposition: A | Discharge status: Home / Self Care | Payer: Medicare Other | Source: Ambulatory Visit | Attending: Internal Medicine | Admitting: Internal Medicine

## 2013-11-24 ENCOUNTER — Encounter (HOSPITAL_COMMUNITY): Payer: Self-pay | Admitting: Pharmacy Technician

## 2013-11-24 ENCOUNTER — Encounter (HOSPITAL_COMMUNITY): Payer: Self-pay

## 2013-11-24 ENCOUNTER — Telehealth (HOSPITAL_COMMUNITY): Payer: Self-pay | Admitting: Cardiology

## 2013-11-24 VITALS — BP 102/67 | HR 64 | Resp 18 | Wt 186.2 lb

## 2013-11-24 DIAGNOSIS — Z951 Presence of aortocoronary bypass graft: Secondary | ICD-10-CM | POA: Diagnosis not present

## 2013-11-24 DIAGNOSIS — I4729 Other ventricular tachycardia: Secondary | ICD-10-CM | POA: Diagnosis not present

## 2013-11-24 DIAGNOSIS — I472 Ventricular tachycardia: Secondary | ICD-10-CM | POA: Diagnosis not present

## 2013-11-24 DIAGNOSIS — N183 Chronic kidney disease, stage 3 unspecified: Secondary | ICD-10-CM | POA: Diagnosis not present

## 2013-11-24 DIAGNOSIS — I5023 Acute on chronic systolic (congestive) heart failure: Secondary | ICD-10-CM | POA: Diagnosis not present

## 2013-11-24 DIAGNOSIS — I5022 Chronic systolic (congestive) heart failure: Secondary | ICD-10-CM

## 2013-11-24 LAB — CBC
HEMATOCRIT: 33.9 % — AB (ref 36.0–46.0)
Hemoglobin: 10.7 g/dL — ABNORMAL LOW (ref 12.0–15.0)
MCH: 29 pg (ref 26.0–34.0)
MCHC: 31.6 g/dL (ref 30.0–36.0)
MCV: 91.9 fL (ref 78.0–100.0)
Platelets: 157 10*3/uL (ref 150–400)
RBC: 3.69 MIL/uL — ABNORMAL LOW (ref 3.87–5.11)
RDW: 16.2 % — AB (ref 11.5–15.5)
WBC: 4.9 10*3/uL (ref 4.0–10.5)

## 2013-11-24 LAB — BASIC METABOLIC PANEL
Anion gap: 16 — ABNORMAL HIGH (ref 5–15)
BUN: 39 mg/dL — ABNORMAL HIGH (ref 6–23)
CHLORIDE: 102 meq/L (ref 96–112)
CO2: 23 meq/L (ref 19–32)
CREATININE: 1.83 mg/dL — AB (ref 0.50–1.10)
Calcium: 9.4 mg/dL (ref 8.4–10.5)
GFR calc non Af Amer: 30 mL/min — ABNORMAL LOW (ref >90)
GFR, EST AFRICAN AMERICAN: 34 mL/min — AB (ref >90)
Glucose, Bld: 77 mg/dL (ref 70–99)
POTASSIUM: 4.8 meq/L (ref 3.7–5.3)
Sodium: 141 mEq/L (ref 137–147)

## 2013-11-24 LAB — PROTIME-INR
INR: 1.16 (ref 0.00–1.49)
PROTHROMBIN TIME: 14.8 s (ref 11.6–15.2)

## 2013-11-24 LAB — PRO B NATRIURETIC PEPTIDE: Pro B Natriuretic peptide (BNP): 1349 pg/mL — ABNORMAL HIGH (ref 0–125)

## 2013-11-24 NOTE — Telephone Encounter (Signed)
Pt scheduled for RHC on 11/25/13 Cpt [id number] With pts current insurance- Medicare A and B- no pre cert req'd              Medicaid- no pre cert req'd

## 2013-11-24 NOTE — Progress Notes (Signed)
Patient ID: [name], female   DOB: [birth year], 58 y.o.   MRN: [id number]  PCP: Dr. Nicoletta Dress (Internal Medicine)  HPI: 58 year old female with history of HTN, DM2, past polysubstance abuse (ETOH, tobacco, cocaine), CAD s/p CABG x5 with MV annuloplasty (2007), ICM s/p ICD  (MDT - no optivol), chronic systolic HF EF 32%, severe MR, CKD stage III and PAD. Blood type O+  Presented in 2007 with acute anterior MI with totalled LAD in setting of cocaine use. At time of cath LAD, LCX and RCA occluded. EF 45%. Had abrupt stent occlusion the next day and had to go back to the lab. 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   Admitted 8/2-8/7 for syncope s/p ICD shock. Concern that VT was possibly from ischemia and taken for [facility]. During cath patient had vagal response and BP dropped to 50s and co-ox was in the upper 40s. Placed on Levophed and transferred to floor. Started on Amiodarone.   Acute Visit: Last week cut amiodarone back to 200 mg BID and this week started feeling very fatigued. She feels the worst that she has ever felt. Denies SOB, orthopnea, PND or CP. Denies palpitations. Weight at home 183 lbs. Goes to [facility] next week for tx work-up. Taking all medications. Following low salt diet and drinking less than 2L a day.   Echos: 05/19/2012  EF 15% 2/15 EF 15%, diffuse hypokinesis, restrictive diastolic function, s/p MV repair with moderate-severe MR.   CPX: 06/17/12 Peak VO2 14.8 (predicted peak VO2 68.5%), VE/VCO2 slope 43.4 OUES: 1.19, Peak RER 1.12 Vent threshold 11.3 (pred peak VO2 52.3%)  CPX (3/15): peak VO2 15.6 (predicted peak VO2 74.5%) VE/VO2 40.8, RER 1.2   LHC (10/2013) 1) severe native CAD with all bypass grafts patent  11/01/13 RHC RA = 5  RV = 47/3/10  PA = 54/16 (30)  PCW = 14 v = 20  Fick cardiac output/index = 4.8/2.6  Thermo CO/CI = 3.7/2.0  PVR = 4.3 WU  FA sat = 95%  PA sat = 59%, 59%   Labs:  08/12/12 - on lipitor 40 mg  daily Cholesterol 180 Triglyceride 195 HDL 29 LDL 112 K 4.5, creatinine 1.4 11/16/12 K 4.0 Labs 1.5 Pro BNP 181 02/14/13: K+ 4.6, Cr 1.33, Dig level 1.8, TSH 2.97 3/15: K 4.2, creatinine 1.07, HCT 32.4 06/2013: Dig level 0.3, pro-BNP 899, Cholesterol 140, TG 106, HDL 36, LDL 82, Cr 1.5, K+ 4.9 11/11/13: K 4.1 Creatinine 1.8   FH: Mother deceased: CAD, DM2, HTN        Father deceased: stroke  SH: Works odds/end jobs for [employer]; disabled. Lives in [place] with 2 sons.   ROS: All systems negative except as listed in HPI, PMH and Problem List.  Past Medical History  Diagnosis Date  . Coronary artery disease     a. s/p CABG x 5 with MV annuloplasty 2007   . Diabetes mellitus   . Hypertension   . Chronic systolic heart failure     a. ICM b. ECHO (06/2013): EF 15%, severe MR (06/2013) c. RHC (10/2013): RA 5, RV 23/2/5, PA 25/6 (14), PCWP 12, Fick CO/CI: 3.2/1.7, PVR 0.7 WU, PA 45%, 47% and 55% (with levophed 5), vagal response during cath   . Polysubstance abuse     history of  (cocaine, tobacco and ETOH)  . V-tach   . Ischemic cardiomyopathy     a. s/p ICD b.  LHC (10/04/13): 1. Severe native CAD with all bypass grafts patent      Current Outpatient Prescriptions  Medication Sig Dispense Refill  . allopurinol (ZYLOPRIM) 100 MG tablet Take 100-200 mg by mouth 2 (two) times daily. Takes 200 mg in the morning and 100 mg in the evening      . amiodarone (PACERONE) 200 MG tablet Take 1 tablet (200 mg total) by mouth 2 (two) times daily.  60 tablet  3  . aspirin EC 81 MG tablet Take 81 mg by mouth daily.      [name] atorvastatin (LIPITOR) 80 MG tablet Take 1 tablet (80 mg total) by mouth daily.  30 tablet  11  . digoxin (LANOXIN) 0.125 MG tablet Take 0.5 tablets (0.0625 mg total) by mouth every other day.  8 tablet  3  . diphenhydrAMINE (BENADRYL) 25 mg capsule Take 25-50 mg by mouth at bedtime as needed for allergies.      . fluticasone (FLONASE) 50 MCG/ACT nasal spray Place 1 spray into both  nostrils daily.  16 g  2  . furosemide (LASIX) 40 MG tablet 80 mg daily. 80 mg (2 tablets) in the am. If weight increases more than 3 lbs day or 5 lbs week take extra 40 mg (1 tablet)      . insulin glargine (LANTUS) 100 UNIT/ML injection Inject 0.3 mLs (30 Units total) into the skin at bedtime.  10 mL  11  . insulin lispro (HUMALOG) 100 UNIT/ML injection Inject 6 Units into the skin 3 (three) times daily as needed for high blood sugar.      . lisinopril (PRINIVIL,ZESTRIL) 5 MG tablet Take 0.5 tablets (2.5 mg total) by mouth at bedtime.  15 tablet  3  . nitroGLYCERIN (NITROSTAT) 0.4 MG SL tablet Place 1 tablet (0.4 mg total) under the tongue every 5 (five) minutes as needed. For chest pain.  25 tablet  11  . potassium chloride SA (K-DUR,KLOR-CON) 20 MEQ tablet Take 1 tablet (20 mEq total) by mouth daily.  30 tablet  9  . [DISCONTINUED] sitaGLIPtin (JANUVIA) 25 MG tablet Take 1 tablet (25 mg total) by mouth daily.  30 tablet  1   No current facility-administered medications for this encounter.    Filed Vitals:   11/24/13 1211  BP: 102/67  Pulse: 64  Resp: 18  Weight: 186 lb 4 oz (84.482 kg)  SpO2: 97%   PHYSICAL EXAM: General:  Well appearing. No resp difficulty  HEENT: normal Neck: supple. JVP 8-9.  Carotids 2+ bilaterally; no bruits. No lymphadenopathy or thryomegaly appreciated. Cor: PMI normal. Regular rate & rhythm. 3/6 HSM apex.  Lungs: clear Abdomen: soft, nontender, mildly distended. No hepatosplenomegaly. No bruits or masses. Good bowel sounds. Extremities: no cyanosis, clubbing, rash, edema Neuro: alert & orientedx3, cranial nerves grossly intact. Moves all 4 extremities w/o difficulty. Affect pleasant.  EKG: SB with 1st degree AV block, QRS 146 msec  ASSESSMENT & PLAN:  1. Chronic Systolic Heart Failure: Ischemic cardiomyopathy s/p ICD, EF 15% (4/15). - NYHA IIIb symptoms and volume mildly elevated. Will have her continue lasix 80 mg daily with 20 meq of potassium. Check  BMET and pro-BNP today. - Intolerant coreg due to dizziness.   - Continue  Lisinopril  1.25 mg twice a day. She has been intolerant up-titration due to dizziness -Continue 0.0625 mg digoxin. (dig level < 0.3 on 10/05/13). - Concerned that patient is nearing the need for advanced therapies. She has work-up at [facility] next week. Will order  RHC for tomorrow.  - Reinforced the need and importance of daily weights, a low sodium diet, and fluid restriction (less than 2 L a day). Instructed to call the HF clinic if weight increases more than 3 lbs overnight or 5 lbs in a week.  2. CAD: No chest pain.  Continue ASA, statin and ACE-I 3. Mitral regurgitation: S/P  mitral valve annuloplasty with CABG.  Significant MR murmur on exam. Moderate to severe MR on echo in 4/15. TEE (06/2013) mitral regurg appears severe, anatomy of repaired valve does not look suitable for MV clipping and would be high risk for MV replacement. Continue to follow.  4. Hx drug abuse: Remains abstinent from drug use and ETOH for >2 years. 5. CKD stage III- baseline Cr 1.5.  6. NSVT- denies any palpitations. Amiodarone was cut back to 200 mg BID last visit and since then has felt worse. Increased fatigue. In NSR today. 7. LBBB - On EKG today QRS enlarged to 146 msec and she is SB. Worried that her bradycardia could be contributing to her fatigue, may need to cut amiodarone back. She may need CRT-D before advanced therapies to see if this would help before moving with advanced therapies. Will discuss after RHC tomorrow.   Junie Bame B NP-C  11/24/2013  Patient seen and examined with Junie Bame, NP. We discussed all aspects of the encounter. I agree with the assessment and plan as stated above.   Symptomatically much worse. Now NYHA IIIB-IV. May be nearing time for advanced therapies. Plan repeat RHC tomorrow. Given wider QRS will ask EP to see re possible CRT.   Total time spent 35 minutes. Over half that time spent discussing above.     Dejana Pugsley,MD 12:14 PM

## 2013-11-25 ENCOUNTER — Inpatient Hospital Stay (HOSPITAL_COMMUNITY)
Admission: RE | Admit: 2013-11-25 | Discharge: 2013-11-28 | DRG: 287 | Disposition: A | Discharge status: Home Health | Payer: Medicare Other | Source: Ambulatory Visit | Attending: Internal Medicine | Admitting: Internal Medicine

## 2013-11-25 ENCOUNTER — Encounter (HOSPITAL_COMMUNITY)
Admission: RE | Disposition: A | Discharge status: Home Health | Payer: Medicare Other | Source: Ambulatory Visit | Attending: Internal Medicine

## 2013-11-25 ENCOUNTER — Encounter (HOSPITAL_COMMUNITY): Payer: Self-pay | Admitting: *Deleted

## 2013-11-25 DIAGNOSIS — N183 Chronic kidney disease, stage 3 unspecified: Secondary | ICD-10-CM | POA: Diagnosis present

## 2013-11-25 DIAGNOSIS — M109 Gout, unspecified: Secondary | ICD-10-CM | POA: Diagnosis present

## 2013-11-25 DIAGNOSIS — I4729 Other ventricular tachycardia: Secondary | ICD-10-CM | POA: Diagnosis present

## 2013-11-25 DIAGNOSIS — Z951 Presence of aortocoronary bypass graft: Secondary | ICD-10-CM | POA: Diagnosis not present

## 2013-11-25 DIAGNOSIS — I447 Left bundle-branch block, unspecified: Secondary | ICD-10-CM | POA: Diagnosis present

## 2013-11-25 DIAGNOSIS — I472 Ventricular tachycardia, unspecified: Secondary | ICD-10-CM | POA: Diagnosis present

## 2013-11-25 DIAGNOSIS — I2510 Atherosclerotic heart disease of native coronary artery without angina pectoris: Secondary | ICD-10-CM | POA: Diagnosis present

## 2013-11-25 DIAGNOSIS — Z87891 Personal history of nicotine dependence: Secondary | ICD-10-CM

## 2013-11-25 DIAGNOSIS — F1410 Cocaine abuse, uncomplicated: Secondary | ICD-10-CM | POA: Diagnosis present

## 2013-11-25 DIAGNOSIS — I129 Hypertensive chronic kidney disease with stage 1 through stage 4 chronic kidney disease, or unspecified chronic kidney disease: Secondary | ICD-10-CM | POA: Diagnosis present

## 2013-11-25 DIAGNOSIS — E119 Type 2 diabetes mellitus without complications: Secondary | ICD-10-CM | POA: Diagnosis present

## 2013-11-25 DIAGNOSIS — I509 Heart failure, unspecified: Secondary | ICD-10-CM | POA: Diagnosis present

## 2013-11-25 DIAGNOSIS — Z95810 Presence of automatic (implantable) cardiac defibrillator: Secondary | ICD-10-CM

## 2013-11-25 DIAGNOSIS — I2589 Other forms of chronic ischemic heart disease: Secondary | ICD-10-CM | POA: Diagnosis not present

## 2013-11-25 DIAGNOSIS — I252 Old myocardial infarction: Secondary | ICD-10-CM

## 2013-11-25 DIAGNOSIS — I5023 Acute on chronic systolic (congestive) heart failure: Principal | ICD-10-CM | POA: Diagnosis present

## 2013-11-25 DIAGNOSIS — I255 Ischemic cardiomyopathy: Secondary | ICD-10-CM | POA: Diagnosis present

## 2013-11-25 DIAGNOSIS — R0602 Shortness of breath: Secondary | ICD-10-CM | POA: Diagnosis present

## 2013-11-25 DIAGNOSIS — I498 Other specified cardiac arrhythmias: Secondary | ICD-10-CM | POA: Diagnosis present

## 2013-11-25 DIAGNOSIS — Z4502 Encounter for adjustment and management of automatic implantable cardiac defibrillator: Secondary | ICD-10-CM

## 2013-11-25 HISTORY — DX: Encounter for adjustment and management of automatic implantable cardiac defibrillator: Z45.02

## 2013-11-25 HISTORY — PX: RIGHT HEART CATHETERIZATION: SHX5447

## 2013-11-25 HISTORY — DX: Left bundle-branch block, unspecified: I44.7

## 2013-11-25 LAB — POCT I-STAT 3, VENOUS BLOOD GAS (G3P V)
ACID-BASE EXCESS: 1 mmol/L (ref 0.0–2.0)
ACID-BASE EXCESS: 2 mmol/L (ref 0.0–2.0)
Acid-Base Excess: 3 mmol/L — ABNORMAL HIGH (ref 0.0–2.0)
BICARBONATE: 25.5 meq/L — AB (ref 20.0–24.0)
BICARBONATE: 28.2 meq/L — AB (ref 20.0–24.0)
Bicarbonate: 27.6 mEq/L — ABNORMAL HIGH (ref 20.0–24.0)
O2 SAT: 60 %
O2 SAT: 60 %
O2 Saturation: 60 %
PH VEN: 7.393 — AB (ref 7.250–7.300)
PO2 VEN: 31 mmHg (ref 30.0–45.0)
TCO2: 29 mmol/L (ref 0–100)
TCO2: 27 mmol/L (ref 0–100)
TCO2: 30 mmol/L (ref 0–100)
pCO2, Ven: 41.4 mmHg — ABNORMAL LOW (ref 45.0–50.0)
pCO2, Ven: 44.8 mmHg — ABNORMAL LOW (ref 45.0–50.0)
pCO2, Ven: 45.3 mmHg (ref 45.0–50.0)
pH, Ven: 7.397 — ABNORMAL HIGH (ref 7.250–7.300)
pH, Ven: 7.408 — ABNORMAL HIGH (ref 7.250–7.300)
pO2, Ven: 32 mmHg (ref 30.0–45.0)
pO2, Ven: 32 mmHg (ref 30.0–45.0)

## 2013-11-25 LAB — BASIC METABOLIC PANEL
Anion gap: 15 (ref 5–15)
BUN: 35 mg/dL — AB (ref 6–23)
CALCIUM: 9.2 mg/dL (ref 8.4–10.5)
CO2: 26 mEq/L (ref 19–32)
Chloride: 100 mEq/L (ref 96–112)
Creatinine, Ser: 1.7 mg/dL — ABNORMAL HIGH (ref 0.50–1.10)
GFR calc Af Amer: 38 mL/min — ABNORMAL LOW (ref >90)
GFR, EST NON AFRICAN AMERICAN: 33 mL/min — AB (ref >90)
GLUCOSE: 136 mg/dL — AB (ref 70–99)
Potassium: 4.2 mEq/L (ref 3.7–5.3)
Sodium: 141 mEq/L (ref 137–147)

## 2013-11-25 LAB — CBC
HCT: 32.5 % — ABNORMAL LOW (ref 36.0–46.0)
HEMOGLOBIN: 10.3 g/dL — AB (ref 12.0–15.0)
MCH: 29.2 pg (ref 26.0–34.0)
MCHC: 31.7 g/dL (ref 30.0–36.0)
MCV: 92.1 fL (ref 78.0–100.0)
Platelets: 144 10*3/uL — ABNORMAL LOW (ref 150–400)
RBC: 3.53 MIL/uL — ABNORMAL LOW (ref 3.87–5.11)
RDW: 16.3 % — ABNORMAL HIGH (ref 11.5–15.5)
WBC: 4.5 10*3/uL (ref 4.0–10.5)

## 2013-11-25 LAB — GLUCOSE, CAPILLARY
GLUCOSE-CAPILLARY: 82 mg/dL (ref 70–99)
GLUCOSE-CAPILLARY: 144 mg/dL — AB (ref 70–99)
Glucose-Capillary: 132 mg/dL — ABNORMAL HIGH (ref 70–99)

## 2013-11-25 LAB — MRSA PCR SCREENING: MRSA by PCR: NEGATIVE

## 2013-11-25 LAB — TSH: TSH: 5.37 u[IU]/mL — ABNORMAL HIGH (ref 0.350–4.500)

## 2013-11-25 LAB — DIGOXIN LEVEL: Digoxin Level: 0.4 ng/mL — ABNORMAL LOW (ref 0.8–2.0)

## 2013-11-25 SURGERY — RIGHT HEART CATH
Anesthesia: LOCAL

## 2013-11-25 MED ORDER — INFLUENZA VAC SPLIT QUAD 0.5 ML IM SUSY
0.5000 mL | PREFILLED_SYRINGE | INTRAMUSCULAR | Status: AC
  Administered 2013-11-26: 0.5 mL via INTRAMUSCULAR
  Filled 2013-11-25: qty 0.5

## 2013-11-25 MED ORDER — AMIODARONE HCL 200 MG PO TABS
200.0000 mg | ORAL_TABLET | Freq: Two times a day (BID) | ORAL | Status: DC
  Administered 2013-11-25: 200 mg via ORAL
  Filled 2013-11-25 (×3): qty 1

## 2013-11-25 MED ORDER — MIDAZOLAM HCL 2 MG/2ML IJ SOLN
INTRAMUSCULAR | Status: AC
  Filled 2013-11-25: qty 2

## 2013-11-25 MED ORDER — INSULIN ASPART 100 UNIT/ML ~~LOC~~ SOLN
0.0000 [IU] | Freq: Three times a day (TID) | SUBCUTANEOUS | Status: DC
  Administered 2013-11-25 – 2013-11-26 (×3): 3 [IU] via SUBCUTANEOUS
  Administered 2013-11-27: 4 [IU] via SUBCUTANEOUS
  Administered 2013-11-28: 3 [IU] via SUBCUTANEOUS

## 2013-11-25 MED ORDER — LIDOCAINE HCL (PF) 1 % IJ SOLN
INTRAMUSCULAR | Status: AC
  Filled 2013-11-25: qty 30

## 2013-11-25 MED ORDER — ASPIRIN EC 81 MG PO TBEC
81.0000 mg | DELAYED_RELEASE_TABLET | Freq: Every day | ORAL | Status: DC
  Administered 2013-11-25 – 2013-11-28 (×4): 81 mg via ORAL
  Filled 2013-11-25 (×4): qty 1

## 2013-11-25 MED ORDER — FENTANYL CITRATE 0.05 MG/ML IJ SOLN
INTRAMUSCULAR | Status: AC
  Filled 2013-11-25: qty 2

## 2013-11-25 MED ORDER — SODIUM CHLORIDE 0.9 % IJ SOLN
3.0000 mL | Freq: Two times a day (BID) | INTRAMUSCULAR | Status: DC
  Administered 2013-11-25 – 2013-11-26 (×2): 3 mL via INTRAVENOUS

## 2013-11-25 MED ORDER — MORPHINE SULFATE 2 MG/ML IJ SOLN: 1.0000 mg | INTRAMUSCULAR | Status: DC | PRN

## 2013-11-25 MED ORDER — ATORVASTATIN CALCIUM 80 MG PO TABS
80.0000 mg | ORAL_TABLET | Freq: Every day | ORAL | Status: DC
  Administered 2013-11-26 – 2013-11-28 (×3): 80 mg via ORAL
  Filled 2013-11-25 (×3): qty 1

## 2013-11-25 MED ORDER — DIGOXIN 0.0625 MG HALF TABLET
0.0625 mg | ORAL_TABLET | ORAL | Status: DC
  Administered 2013-11-27: 0.0625 mg via ORAL
  Filled 2013-11-25: qty 1

## 2013-11-25 MED ORDER — ALLOPURINOL 100 MG PO TABS
100.0000 mg | ORAL_TABLET | Freq: Two times a day (BID) | ORAL | Status: DC
  Filled 2013-11-25 (×2): qty 1

## 2013-11-25 MED ORDER — SODIUM CHLORIDE 0.9 % IV SOLN: 250.0000 mL | INTRAVENOUS | Status: DC | PRN

## 2013-11-25 MED ORDER — MILRINONE IN DEXTROSE 20 MG/100ML IV SOLN
0.2500 ug/kg/min | INTRAVENOUS | Status: DC
  Administered 2013-11-26 – 2013-11-28 (×3): 0.25 ug/kg/min via INTRAVENOUS
  Filled 2013-11-25 (×4): qty 100

## 2013-11-25 MED ORDER — ALLOPURINOL 100 MG PO TABS
100.0000 mg | ORAL_TABLET | Freq: Every day | ORAL | Status: DC
  Administered 2013-11-25 – 2013-11-27 (×3): 100 mg via ORAL
  Filled 2013-11-25 (×4): qty 1

## 2013-11-25 MED ORDER — ALLOPURINOL 100 MG PO TABS
200.0000 mg | ORAL_TABLET | Freq: Two times a day (BID) | ORAL | Status: DC
  Administered 2013-11-26: 200 mg via ORAL
  Filled 2013-11-25 (×2): qty 2

## 2013-11-25 MED ORDER — SODIUM CHLORIDE 0.9 % IJ SOLN
3.0000 mL | INTRAMUSCULAR | Status: DC | PRN
Start: 2013-11-25 — End: 2013-11-25

## 2013-11-25 MED ORDER — SODIUM CHLORIDE 0.9 % IJ SOLN: 3.0000 mL | INTRAMUSCULAR | Status: DC | PRN

## 2013-11-25 MED ORDER — SODIUM CHLORIDE 0.9 % IV SOLN: INTRAVENOUS | Status: DC

## 2013-11-25 MED ORDER — NITROGLYCERIN 0.4 MG SL SUBL: 0.4000 mg | SUBLINGUAL_TABLET | SUBLINGUAL | Status: DC | PRN

## 2013-11-25 MED ORDER — TRAMADOL HCL 50 MG PO TABS
50.0000 mg | ORAL_TABLET | Freq: Four times a day (QID) | ORAL | Status: DC | PRN
Start: 2013-11-25 — End: 2013-11-28
  Administered 2013-11-25 – 2013-11-27 (×4): 50 mg via ORAL
  Filled 2013-11-25 (×4): qty 1

## 2013-11-25 MED ORDER — SODIUM CHLORIDE 0.9 % IJ SOLN: 3.0000 mL | Freq: Two times a day (BID) | INTRAMUSCULAR | Status: DC

## 2013-11-25 MED ORDER — ENOXAPARIN SODIUM 30 MG/0.3ML ~~LOC~~ SOLN
30.0000 mg | SUBCUTANEOUS | Status: DC
  Administered 2013-11-25: 30 mg via SUBCUTANEOUS
  Filled 2013-11-25 (×2): qty 0.3

## 2013-11-25 MED ORDER — ACETAMINOPHEN 325 MG PO TABS
650.0000 mg | ORAL_TABLET | ORAL | Status: DC | PRN
  Administered 2013-11-25 – 2013-11-27 (×5): 650 mg via ORAL
  Filled 2013-11-25 (×5): qty 2

## 2013-11-25 MED ORDER — FUROSEMIDE 10 MG/ML IJ SOLN
80.0000 mg | Freq: Two times a day (BID) | INTRAMUSCULAR | Status: DC
  Administered 2013-11-25: 80 mg via INTRAVENOUS
  Filled 2013-11-25 (×3): qty 8

## 2013-11-25 MED ORDER — HEPARIN (PORCINE) IN NACL 2-0.9 UNIT/ML-% IJ SOLN
INTRAMUSCULAR | Status: AC
  Filled 2013-11-25: qty 500

## 2013-11-25 MED ORDER — FLUTICASONE PROPIONATE 50 MCG/ACT NA SUSP
1.0000 | Freq: Every day | NASAL | Status: DC
  Administered 2013-11-25 – 2013-11-28 (×4): 1 via NASAL
  Filled 2013-11-25: qty 16

## 2013-11-25 MED ORDER — ONDANSETRON HCL 4 MG/2ML IJ SOLN: 4.0000 mg | Freq: Four times a day (QID) | INTRAMUSCULAR | Status: DC | PRN

## 2013-11-25 MED ORDER — MILRINONE IN DEXTROSE 20 MG/100ML IV SOLN
0.2500 ug/kg/min | INTRAVENOUS | Status: DC
  Administered 2013-11-25: 0.25 ug/kg/min via INTRAVENOUS
  Filled 2013-11-25: qty 100

## 2013-11-25 MED ORDER — POTASSIUM CHLORIDE CRYS ER 20 MEQ PO TBCR
20.0000 meq | EXTENDED_RELEASE_TABLET | Freq: Every day | ORAL | Status: DC
  Administered 2013-11-26 – 2013-11-28 (×3): 20 meq via ORAL
  Filled 2013-11-25 (×3): qty 1

## 2013-11-25 NOTE — H&P (Signed)
ADVANCED HEART FAILURE H&P    HPI: 58 year old female with history of HTN, DM2, past polysubstance abuse (ETOH, tobacco, cocaine), CAD s/p CABG x5 with MV annuloplasty (2007), ICM s/p ICD (MDT - no optivol), chronic systolic HF EF 47%, severe MR, CKD stage III and PAD. Blood type O+   Presented in 2007 with acute anterior MI with totalled LAD in setting of cocaine use. At time of cath LAD, LCX and RCA occluded. EF 45%. Had abrupt stent occlusion the next day and had to go back to the lab. 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   Admitted 8/2-8/7 for syncope s/p ICD shock. Concern that VT was possibly from ischemia and taken for [facility]. During cath patient had vagal response and BP dropped to 50s and co-ox was in the upper 40s. Placed on Levophed and transferred to floor. Started on Amiodarone.   Yesterday she was evaluated in HF clinic and with worsening HF symptoms, specifically profound fatigue with mild dyspnea. Weight was 183 pounds. Plan for heart transplant evaluation at [facility] next week.  She presented today for RHC to further assess hemodynamics. Admitted post cath.   2/15 ECHO EF 15%, diffuse hypokinesis, restrictive diastolic function, s/p MV repair with moderate-severe MR.  3/15 CPX Peak VO2: 15.6 ml/kg/min (74.5% predicted peak VO2) VE/VCO2 slope: 40.8 OUES: 1.23 Peak RER: 1.20 Ventilatory Threshold: 11.1 (53% predicted peak VO2) Peak RR: 48 Peak Ventilation: 60.8 VE/MVV: 66.3% PETCO2 at peak: 29 O2pulse: 9 (90% predicted O2pulse)    LHC (10/2013)  1) severe native CAD with all bypass grafts patent  11/01/13 RHC  RA = 5  RV = 47/3/10  PA = 54/16 (30)  PCW = 14 v = 20  Fick cardiac output/index = 4.8/2.6  Thermo CO/CI = 3.7/2.0  PVR = 4.3 WU  FA sat = 95%  PA sat = 59%, 59%   11/25/13 RHC  RA = 11  RV = 56/6/11  PA = 57/20 (35)  PCW = 23 with v-waves to 36  Fick cardiac output/index = 5.0/2.7  Thermo CO/CI = 3.5/1.9  PVR =  3.4 WU  FA sat = 94%  PA sat = 60%, 60%  SVC sat = 60%   FH: Mother deceased: CAD, DM2, HTN  Father deceased: stroke  SH: Works odds/end jobs for [employer]; disabled. Lives in [place] with 1 son (10th grade) , 1 son at [employer]. She does not drink or smoke.  Has supportive sister     Review of Systems:     Cardiac Review of Systems: {Y] = yes [ ]  = no  Chest Pain [    ]  Resting SOB [   ] Exertional SOB  [ Y ]  Orthopnea [  ]   Pedal Edema [   ]    Palpitations [  ] Syncope  [  ]   Presyncope [   ]  General Review of Systems: [Y] = yes [  ]=no Constitional: recent weight change [  ]; anorexia [  ]; fatigue [ Y ]; nausea [  ]; night sweats [  ]; fever [  ]; or chills [  ];  Dental: poor dentition[  ];   Eye : blurred vision [  ]; diplopia [   ]; vision changes [  ];  Amaurosis fugax[  ]; Resp: cough [  ];  wheezing[  ];  hemoptysis[  ]; shortness of breath[ Y ]; paroxysmal nocturnal dyspnea[  ]; dyspnea on exertion[  ]; or orthopnea[  ];  GI:  gallstones[  ], vomiting[  ];  dysphagia[  ]; melena[  ];  hematochezia [  ]; heartburn[  ];   Hx of  Colonoscopy[  ]; GU: kidney stones [  ]; hematuria[  ];   dysuria [  ];  nocturia[  ];               Skin: rash [  ], swelling[  ];, hair loss[  ];  peripheral edema[  ];  or itching[  ]; Musculosketetal: myalgias[  ];  joint swelling[  ];  joint erythema[  ];  joint pain[  ];  back pain[  ];  Heme/Lymph: bruising[  ];  bleeding[  ];  anemia[  ];  Neuro: TIA[  ];  headaches[  ];  stroke[  ];  vertigo[  ];  seizures[  ];   paresthesias[  ];  difficulty walking[  ];  Psych:depression[  ]; anxiety[Y  ];  Endocrine: diabetes[ Y ];  thyroid dysfunction[  ];  Other:  Past Medical History  Diagnosis Date  . Coronary artery disease     a. s/p CABG x 5 with MV annuloplasty 2007   . Diabetes mellitus   . Hypertension   . Chronic systolic heart failure     a. ICM b. ECHO  (06/2013): EF 15%, severe MR (06/2013) c. RHC (10/2013): RA 5, RV 23/2/5, PA 25/6 (14), PCWP 12, Fick CO/CI: 3.2/1.7, PVR 0.7 WU, PA 45%, 47% and 55% (with levophed 5), vagal response during cath   . Polysubstance abuse     history of  (cocaine, tobacco and ETOH)  . V-tach   . Ischemic cardiomyopathy     a. s/p ICD b. LHC (10/04/13): 1. Severe native CAD with all bypass grafts patent       No Known Allergies  History   Social History  . Marital Status: Widowed    Spouse Name: N/A    Number of Children: N/A  . Years of Education: N/A   Occupational History  . disable    Social History Main Topics  . Smoking status: Former Smoker    Quit date: 03/03/2011  . Smokeless tobacco: Not on file  . Alcohol Use: Yes     Comment: Had some drinks New Years, describes social drinkingC  . Drug Use: No     Comment: Has history of cocaine use, denies recent  . Sexual Activity: Not on file   Other Topics Concern  . Not on file   Social History Narrative  . No narrative on file    Family History  Problem Relation Age of Onset  . Heart attack Mother     Died age 13  . Diabetes Maternal Grandmother   . Heart attack Cousin   . Heart attack Maternal Uncle     PHYSICAL EXAM: Filed Vitals:   11/25/13 1122  BP:   Pulse: 63  Temp:   Resp:    General: Fatigued appearing. No resp difficulty  HEENT: normal  Neck: supple. JVP 8-9. Carotids 2+ bilaterally; no bruits. No lymphadenopathy or thryomegaly appreciated. R neck swan ganz catheter Cor: PMI normal. Regular rate &  rhythm. 3/6 HSM apex.  Lungs: clear  Abdomen: soft, nontender, mildly distended. No hepatosplenomegaly. No bruits or masses. Good bowel sounds.  Extremities: no cyanosis, clubbing, rash, edema  Neuro: alert & orientedx3, cranial nerves grossly intact. Moves all 4 extremities w/o difficulty. Affect pleasant.   Results for orders placed during the hospital encounter of 11/25/13 (from the past 24 hour(s))  GLUCOSE,  CAPILLARY     Status: None   Collection Time    11/25/13 10:32 AM      Result Value Ref Range   Glucose-Capillary 82  70 - 99 mg/dL   No results found.   ASSESSMENT: 1)Acute/Chronic systolic HF  - EF 65%, severe MR (06/2013)  2) Severe MR  3) CAD s/p CABG  4) Vtach- on amiodarone 200 mg twice a day 5) Gout 6) DM II    PLAN/DISCUSSION: Ms Statzer is 58 year old with ICM and A/C systolic HF admitted from the cath lab to optimize HF. Over the last week she has had a functional decline. As noted above RHC showed elevated filling pressures. Fick output was ok but thermo a little worse and seems to correlate with symptoms. For now will diurese with IV lasix and start milrinone 0.25 mcg. She may or may not need home milrinone. She is not on a BB as she had ongoing dizziness. Hold lisinopril for now. Continue digoxin 0.0625 mg every other day.   She has been referred to [facility] for possible transplant and has initial appointment next Tuesday.   EP contacted to consider CRT-D upgrade.    Continue amiodarone 200 mg twice a day. Check TSH.  CLEGG,AMYNP-C  1:34 PM  Patient seen and examined with Darrick Grinder, NP. We discussed all aspects of the encounter. I agree with the assessment and plan as stated above.   Difficult situation. Hemodynamics overall stable but symptoms worsening. CPX marginal. I think she is nearing point at which she will need advanced therapies but may not be ready just yet. Will admit to ICU and start milrinone and see how she feels. Will also ask Dr. Caryl Comes to weigh in again regarding CRT upgrade to see if this may help imrpove her symptoms and delay need for advanced therapies.  Daniel Bensimhon,MD 1:50 PM

## 2013-11-25 NOTE — H&P (View-Only) (Signed)
Patient ID: [name], female   DOB: 08-Jun-1955, 58 y.o.   MRN: [id number]  PCP: Dr. Nicoletta Dress (Internal Medicine)  HPI: 58 year old female with history of HTN, DM2, past polysubstance abuse (ETOH, tobacco, cocaine), CAD s/p CABG x5 with MV annuloplasty (2007), ICM s/p ICD  (MDT - no optivol), chronic systolic HF EF 25%, severe MR, CKD stage III and PAD. Blood type O+  Presented in 2007 with acute anterior MI with totalled LAD in setting of cocaine use. At time of cath LAD, LCX and RCA occluded. EF 45%. Had abrupt stent occlusion the next day and had to go back to the lab. 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   Admitted 8/2-8/7 for syncope s/p ICD shock. Concern that VT was possibly from ischemia and taken for [facility]. During cath patient had vagal response and BP dropped to 50s and co-ox was in the upper 40s. Placed on Levophed and transferred to floor. Started on Amiodarone.   Acute Visit: Last week cut amiodarone back to 200 mg BID and this week started feeling very fatigued. She feels the worst that she has ever felt. Denies SOB, orthopnea, PND or CP. Denies palpitations. Weight at home 183 lbs. Goes to [facility] next week for tx work-up. Taking all medications. Following low salt diet and drinking less than 2L a day.   Echos: 05/19/2012  EF 15% 2/15 EF 15%, diffuse hypokinesis, restrictive diastolic function, s/p MV repair with moderate-severe MR.   CPX: 06/17/12 Peak VO2 14.8 (predicted peak VO2 68.5%), VE/VCO2 slope 43.4 OUES: 1.19, Peak RER 1.12 Vent threshold 11.3 (pred peak VO2 52.3%)  CPX (3/15): peak VO2 15.6 (predicted peak VO2 74.5%) VE/VO2 40.8, RER 1.2   LHC (10/2013) 1) severe native CAD with all bypass grafts patent  11/01/13 RHC RA = 5  RV = 47/3/10  PA = 54/16 (30)  PCW = 14 v = 20  Fick cardiac output/index = 4.8/2.6  Thermo CO/CI = 3.7/2.0  PVR = 4.3 WU  FA sat = 95%  PA sat = 59%, 59%   Labs:  08/12/12 - on lipitor 40 mg  daily Cholesterol 180 Triglyceride 195 HDL 29 LDL 112 K 4.5, creatinine 1.4 11/16/12 K 4.0 Labs 1.5 Pro BNP 181 02/14/13: K+ 4.6, Cr 1.33, Dig level 1.8, TSH 2.97 3/15: K 4.2, creatinine 1.07, HCT 32.4 06/2013: Dig level 0.3, pro-BNP 899, Cholesterol 140, TG 106, HDL 36, LDL 82, Cr 1.5, K+ 4.9 11/11/13: K 4.1 Creatinine 1.8   FH: Mother deceased: CAD, DM2, HTN        Father deceased: stroke  SH: Works odds/end jobs for [employer]; disabled. Lives in [place] with 2 sons.   ROS: All systems negative except as listed in HPI, PMH and Problem List.  Past Medical History  Diagnosis Date  . Coronary artery disease     a. s/p CABG x 5 with MV annuloplasty 2007   . Diabetes mellitus   . Hypertension   . Chronic systolic heart failure     a. ICM b. ECHO (06/2013): EF 15%, severe MR (06/2013) c. RHC (10/2013): RA 5, RV 23/2/5, PA 25/6 (14), PCWP 12, Fick CO/CI: 3.2/1.7, PVR 0.7 WU, PA 45%, 47% and 55% (with levophed 5), vagal response during cath   . Polysubstance abuse     history of  (cocaine, tobacco and ETOH)  . V-tach   . Ischemic cardiomyopathy     a. s/p ICD b.  LHC (10/04/13): 1. Severe native CAD with all bypass grafts patent      Current Outpatient Prescriptions  Medication Sig Dispense Refill  . allopurinol (ZYLOPRIM) 100 MG tablet Take 100-200 mg by mouth 2 (two) times daily. Takes 200 mg in the morning and 100 mg in the evening      . amiodarone (PACERONE) 200 MG tablet Take 1 tablet (200 mg total) by mouth 2 (two) times daily.  60 tablet  3  . aspirin EC 81 MG tablet Take 81 mg by mouth daily.      [name] atorvastatin (LIPITOR) 80 MG tablet Take 1 tablet (80 mg total) by mouth daily.  30 tablet  11  . digoxin (LANOXIN) 0.125 MG tablet Take 0.5 tablets (0.0625 mg total) by mouth every other day.  8 tablet  3  . diphenhydrAMINE (BENADRYL) 25 mg capsule Take 25-50 mg by mouth at bedtime as needed for allergies.      . fluticasone (FLONASE) 50 MCG/ACT nasal spray Place 1 spray into both  nostrils daily.  16 g  2  . furosemide (LASIX) 40 MG tablet 80 mg daily. 80 mg (2 tablets) in the am. If weight increases more than 3 lbs day or 5 lbs week take extra 40 mg (1 tablet)      . insulin glargine (LANTUS) 100 UNIT/ML injection Inject 0.3 mLs (30 Units total) into the skin at bedtime.  10 mL  11  . insulin lispro (HUMALOG) 100 UNIT/ML injection Inject 6 Units into the skin 3 (three) times daily as needed for high blood sugar.      . lisinopril (PRINIVIL,ZESTRIL) 5 MG tablet Take 0.5 tablets (2.5 mg total) by mouth at bedtime.  15 tablet  3  . nitroGLYCERIN (NITROSTAT) 0.4 MG SL tablet Place 1 tablet (0.4 mg total) under the tongue every 5 (five) minutes as needed. For chest pain.  25 tablet  11  . potassium chloride SA (K-DUR,KLOR-CON) 20 MEQ tablet Take 1 tablet (20 mEq total) by mouth daily.  30 tablet  9  . [DISCONTINUED] sitaGLIPtin (JANUVIA) 25 MG tablet Take 1 tablet (25 mg total) by mouth daily.  30 tablet  1   No current facility-administered medications for this encounter.    Filed Vitals:   11/24/13 1211  BP: 102/67  Pulse: 64  Resp: 18  Weight: 186 lb 4 oz (84.482 kg)  SpO2: 97%   PHYSICAL EXAM: General:  Well appearing. No resp difficulty  HEENT: normal Neck: supple. JVP 8-9.  Carotids 2+ bilaterally; no bruits. No lymphadenopathy or thryomegaly appreciated. Cor: PMI normal. Regular rate & rhythm. 3/6 HSM apex.  Lungs: clear Abdomen: soft, nontender, mildly distended. No hepatosplenomegaly. No bruits or masses. Good bowel sounds. Extremities: no cyanosis, clubbing, rash, edema Neuro: alert & orientedx3, cranial nerves grossly intact. Moves all 4 extremities w/o difficulty. Affect pleasant.  EKG: SB with 1st degree AV block, QRS 146 msec  ASSESSMENT & PLAN:  1. Chronic Systolic Heart Failure: Ischemic cardiomyopathy s/p ICD, EF 15% (4/15). - NYHA IIIb symptoms and volume mildly elevated. Will have her continue lasix 80 mg daily with 20 meq of potassium. Check  BMET and pro-BNP today. - Intolerant coreg due to dizziness.   - Continue  Lisinopril  1.25 mg twice a day. She has been intolerant up-titration due to dizziness -Continue 0.0625 mg digoxin. (dig level < 0.3 on 10/05/13). - Concerned that patient is nearing the need for advanced therapies. She has work-up at [facility] next week. Will order  RHC for tomorrow.  - Reinforced the need and importance of daily weights, a low sodium diet, and fluid restriction (less than 2 L a day). Instructed to call the HF clinic if weight increases more than 3 lbs overnight or 5 lbs in a week.  2. CAD: No chest pain.  Continue ASA, statin and ACE-I 3. Mitral regurgitation: S/P  mitral valve annuloplasty with CABG.  Significant MR murmur on exam. Moderate to severe MR on echo in 4/15. TEE (06/2013) mitral regurg appears severe, anatomy of repaired valve does not look suitable for MV clipping and would be high risk for MV replacement. Continue to follow.  4. Hx drug abuse: Remains abstinent from drug use and ETOH for >2 years. 5. CKD stage III- baseline Cr 1.5.  6. NSVT- denies any palpitations. Amiodarone was cut back to 200 mg BID last visit and since then has felt worse. Increased fatigue. In NSR today. 7. LBBB - On EKG today QRS enlarged to 146 msec and she is SB. Worried that her bradycardia could be contributing to her fatigue, may need to cut amiodarone back. She may need CRT-D before advanced therapies to see if this would help before moving with advanced therapies. Will discuss after RHC tomorrow.   Junie Bame B NP-C  11/24/2013  Patient seen and examined with Junie Bame, NP. We discussed all aspects of the encounter. I agree with the assessment and plan as stated above.   Symptomatically much worse. Now NYHA IIIB-IV. May be nearing time for advanced therapies. Plan repeat RHC tomorrow. Given wider QRS will ask EP to see re possible CRT.   Total time spent 35 minutes. Over half that time spent discussing above.     Daniel Bensimhon,MD 12:14 PM

## 2013-11-25 NOTE — Interval H&P Note (Signed)
History and Physical Interval Note:  11/25/2013 12:18 PM  [name]  has presented today for surgery, with the diagnosis of heart failure   The various methods of treatment have been discussed with the patient and family. After consideration of risks, benefits and other options for treatment, the patient has consented to  Procedure(s): RIGHT HEART CATH (N/A) as a surgical intervention .  The patient's history has been reviewed, patient examined, no change in status, stable for surgery.  I have reviewed the patient's chart and labs.  Questions were answered to the patient's satisfaction.     Daniel Bensimhon

## 2013-11-25 NOTE — CV Procedure (Signed)
Cardiac Cath Procedure Note:  Indication:  Heart failure  Procedures performed:  1) Right heart catheterization  Description of procedure:   The risks and indication of the procedure were explained. Consent was signed and placed on the chart. An appropriate timeout was taken prior to the procedure. The right neck was prepped and draped in the routine sterile fashion and anesthetized with 1% local lidocaine.   A 7 FR venous sheath was placed in the right internal jugular vein using a modified Seldinger technique. A standard Swan-Ganz catheter was used for the procedure.   Complications: None apparent.  Findings:  RA = 11 RV = 56/6/11 PA = 57/20 (35) PCW = 23 with v-waves to 36 Fick cardiac output/index = 5.0/2.7 Thermo CO/CI = 3.5/1.9 PVR = 3.4 WU FA sat = 94% PA sat = 60%, 60% SVC sat = 60%  Assessment/Plan:  She has worsening HF symptoms now NYHA IIIB-IV. Filling pressures increased. Fick outputs look ok but thermodilution numbers seem to correlate more with her symptoms. I think we are nearing time for advanced therapies. Will ask EP to see regarding CRT upgrade.   Leave swan in for now. Diurese. Consider milrinone versus VAD work-up.   Kery Batzel,MD 12:22 PM

## 2013-11-25 NOTE — Care Management Note (Addendum)
    Page 1 of 2   11/28/2013     9:58:19 AM CARE MANAGEMENT NOTE 11/28/2013  Patient:  [name], [name]   Account Number:  [id number]  Date Initiated:  11/25/2013  Documentation initiated by:  Elissa Hefty  Subjective/Objective Assessment:   adm post cath w heart failure     Action/Plan:   lives w fam, pcp dr everette moding   Anticipated DC Date:     Anticipated DC Plan:  [place]  CM consult      PAC Choice  [place]   Choice offered to / List presented to:  [name]   DME arranged  IV PUMP/EQUIPMENT      DME agency  [place] arranged  HH-10 DISEASE MANAGEMENT  HH-1 RN      [place].   Status of service:   Medicare Important Message given?  YES (If response is "NO", the following Medicare IM given date fields will be blank) Date Medicare IM given:  11/28/2013 Medicare IM given by:  Elissa Hefty Date Additional Medicare IM given:   Additional Medicare IM given by:    Discharge Disposition:  [place]  Per UR Regulation:  Reviewed for med. necessity/level of care/duration of stay  If discussed at [place] of Stay Meetings, dates discussed:    Comments:  9/28  1000 debbie Robin Marksberry rn,bsn ahc aware will need home iv milrinone and poss dc today. ahc preparing for home milrinone if dc today.  9/25 1418 debbie Robin Notaro rn,bsn spoke w pt's nse and on iv milrinone. not sure if pt will need iv milrinone at dc or not. spoke w pt and went over hhc agencies and no pref. discussed hhrn ck on her after disch. no pref and ref made to donna w [facility] for hhrn. will cont to follow.

## 2013-11-26 ENCOUNTER — Encounter: Payer: Self-pay | Admitting: Internal Medicine

## 2013-11-26 DIAGNOSIS — I5023 Acute on chronic systolic (congestive) heart failure: Secondary | ICD-10-CM | POA: Diagnosis not present

## 2013-11-26 DIAGNOSIS — I2510 Atherosclerotic heart disease of native coronary artery without angina pectoris: Secondary | ICD-10-CM

## 2013-11-26 DIAGNOSIS — R195 Other fecal abnormalities: Secondary | ICD-10-CM | POA: Insufficient documentation

## 2013-11-26 DIAGNOSIS — N183 Chronic kidney disease, stage 3 unspecified: Secondary | ICD-10-CM

## 2013-11-26 LAB — GLUCOSE, CAPILLARY
GLUCOSE-CAPILLARY: 106 mg/dL — AB (ref 70–99)
GLUCOSE-CAPILLARY: 139 mg/dL — AB (ref 70–99)
Glucose-Capillary: 121 mg/dL — ABNORMAL HIGH (ref 70–99)
Glucose-Capillary: 135 mg/dL — ABNORMAL HIGH (ref 70–99)

## 2013-11-26 LAB — CBC WITH DIFFERENTIAL/PLATELET
BASOS PCT: 1 % (ref 0–1)
Basophils Absolute: 0 10*3/uL (ref 0.0–0.1)
Eosinophils Absolute: 0.1 10*3/uL (ref 0.0–0.7)
Eosinophils Relative: 3 % (ref 0–5)
HCT: 32 % — ABNORMAL LOW (ref 36.0–46.0)
HEMOGLOBIN: 10.1 g/dL — AB (ref 12.0–15.0)
Lymphocytes Relative: 24 % (ref 12–46)
Lymphs Abs: 1 10*3/uL (ref 0.7–4.0)
MCH: 28.9 pg (ref 26.0–34.0)
MCHC: 31.6 g/dL (ref 30.0–36.0)
MCV: 91.7 fL (ref 78.0–100.0)
Monocytes Absolute: 0.3 10*3/uL (ref 0.1–1.0)
Monocytes Relative: 8 % (ref 3–12)
NEUTROS PCT: 64 % (ref 43–77)
Neutro Abs: 2.6 10*3/uL (ref 1.7–7.7)
Platelets: 128 10*3/uL — ABNORMAL LOW (ref 150–400)
RBC: 3.49 MIL/uL — ABNORMAL LOW (ref 3.87–5.11)
RDW: 16.2 % — ABNORMAL HIGH (ref 11.5–15.5)
WBC: 4.1 10*3/uL (ref 4.0–10.5)

## 2013-11-26 LAB — DIGOXIN LEVEL: Digoxin Level: 0.5 ng/mL — ABNORMAL LOW (ref 0.8–2.0)

## 2013-11-26 LAB — BASIC METABOLIC PANEL
ANION GAP: 13 (ref 5–15)
BUN: 42 mg/dL — ABNORMAL HIGH (ref 6–23)
CO2: 28 meq/L (ref 19–32)
CREATININE: 1.62 mg/dL — AB (ref 0.50–1.10)
Calcium: 9.2 mg/dL (ref 8.4–10.5)
Chloride: 98 mEq/L (ref 96–112)
GFR calc non Af Amer: 34 mL/min — ABNORMAL LOW (ref >90)
GFR, EST AFRICAN AMERICAN: 40 mL/min — AB (ref >90)
Glucose, Bld: 130 mg/dL — ABNORMAL HIGH (ref 70–99)
Potassium: 3.9 mEq/L (ref 3.7–5.3)
Sodium: 139 mEq/L (ref 137–147)

## 2013-11-26 LAB — CARBOXYHEMOGLOBIN
Carboxyhemoglobin: 1 % (ref 0.5–1.5)
Methemoglobin: 0.8 % (ref 0.0–1.5)
O2 SAT: 95.9 %
Total hemoglobin: 10.1 g/dL — ABNORMAL LOW (ref 12.0–16.0)

## 2013-11-26 LAB — MAGNESIUM: Magnesium: 2.4 mg/dL (ref 1.5–2.5)

## 2013-11-26 MED ORDER — POTASSIUM CHLORIDE CRYS ER 20 MEQ PO TBCR
20.0000 meq | EXTENDED_RELEASE_TABLET | Freq: Once | ORAL | Status: AC
  Administered 2013-11-26: 20 meq via ORAL
  Filled 2013-11-26: qty 1

## 2013-11-26 MED ORDER — AMIODARONE HCL 200 MG PO TABS
200.0000 mg | ORAL_TABLET | Freq: Every day | ORAL | Status: DC
  Filled 2013-11-26: qty 1

## 2013-11-26 MED ORDER — AMIODARONE HCL 200 MG PO TABS
200.0000 mg | ORAL_TABLET | Freq: Two times a day (BID) | ORAL | Status: DC
  Administered 2013-11-26 – 2013-11-27 (×4): 200 mg via ORAL
  Filled 2013-11-26 (×4): qty 1

## 2013-11-26 MED ORDER — ALLOPURINOL 100 MG PO TABS
200.0000 mg | ORAL_TABLET | Freq: Every day | ORAL | Status: DC
  Administered 2013-11-27 – 2013-11-28 (×2): 200 mg via ORAL
  Filled 2013-11-26 (×2): qty 2

## 2013-11-26 MED ORDER — FUROSEMIDE 80 MG PO TABS
80.0000 mg | ORAL_TABLET | Freq: Two times a day (BID) | ORAL | Status: DC
  Administered 2013-11-26 (×2): 80 mg via ORAL
  Filled 2013-11-26 (×5): qty 1

## 2013-11-26 MED ORDER — ENOXAPARIN SODIUM 40 MG/0.4ML ~~LOC~~ SOLN
40.0000 mg | SUBCUTANEOUS | Status: DC
  Administered 2013-11-26 – 2013-11-27 (×2): 40 mg via SUBCUTANEOUS
  Filled 2013-11-26 (×3): qty 0.4

## 2013-11-26 NOTE — Progress Notes (Signed)
Patient ID: [name], female   DOB: 13-Sep-1955, 58 y.o.   MRN: [id number]    58 year old female with history of HTN, DM2, past polysubstance abuse (ETOH, tobacco, cocaine), CAD s/p CABG x5 with MV annuloplasty (2007), ICM s/p ICD (MDT - no optivol), chronic systolic HF EF 97%, severe MR, CKD stage III and PAD. Blood type O+   Presented in 2007 with acute anterior MI with totalled LAD in setting of cocaine use. At time of cath LAD, LCX and RCA occluded. EF 45%. Had abrupt stent occlusion the next day and had to go back to the lab. 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   Admitted 8/2-8/7 for syncope s/p ICD shock. Concern that VT was possibly from ischemia and taken for [facility]. During cath patient had vagal response and BP dropped to 50s and co-ox was in the upper 40s. Placed on Levophed and transferred to floor. Started on Amiodarone.   9/24 she was evaluated in HF clinic and with worsening HF symptoms, specifically profound fatigue with mild dyspnea. Weight was 183 pounds. Plan for heart transplant evaluation at [facility] next week. She presented today for RHC to further assess hemodynamics. Admitted post cath.   2/15 ECHO EF 15%, diffuse hypokinesis, restrictive diastolic function, s/p MV repair with moderate-severe MR.   3/15 CPX  Peak VO2: 15.6 ml/kg/min (74.5% predicted peak VO2) VE/VCO2 slope: 40.8 OUES: 1.23 Peak RER: 1.20 Ventilatory Threshold: 11.1 (53% predicted peak VO2) Peak RR: 48 Peak Ventilation: 60.8 VE/MVV: 66.3% PETCO2 at peak: 29 O2pulse: 9 (90% predicted O2pulse)   LHC (10/2013)  1) severe native CAD with all bypass grafts patent   11/25/13 RHC  RA = 11  RV = 56/6/11  PA = 57/20 (35)  PCW = 23 with v-waves to 21  Fick cardiac output/index = 5.0/2.7  Thermo CO/CI = 3.5/1.9  PVR = 3.4 WU  FA sat = 94%  PA sat = 60%, 60%  SVC sat = 60%  This morning, she is doing well.  Diuresed overnight on IV Lasix.  No dyspnea.   Filling pressures normalized. Creatinine down a bit.  CVP 4 PA 49/9 PCWP 12 CI 2.3   Filed Vitals:   11/26/13 0500 11/26/13 0535 11/26/13 0600 11/26/13 0700  BP: 90/46  106/39 101/42  Pulse: 46 47 48 46  Temp: 97.2 F (36.2 C) 97.2 F (36.2 C) 97.2 F (36.2 C) 97.5 F (36.4 C)  TempSrc:   Core (Comment) Core (Comment)  Resp: 10 11 14 13   Height:      Weight: 187 lb 6.3 oz (85 kg)     SpO2: 97% 94% 97% 99%    Intake/Output Summary (Last 24 hours) at 11/26/13 0739 Last data filed at 11/26/13 0700  Gross per 24 hour  Intake 1196.49 ml  Output   2250 ml  Net -1053.51 ml    LABS: Basic Metabolic Panel:  Recent Labs  11/25/13 1400 11/26/13 0500  NA 141 139  K 4.2 3.9  CL 100 98  CO2 26 28  GLUCOSE 136* 130*  BUN 35* 42*  CREATININE 1.70* 1.62*  CALCIUM 9.2 9.2   Liver Function Tests: No results found for this basename: AST, ALT, ALKPHOS, BILITOT, PROT, ALBUMIN,  in the last 72 hours No results found for this basename: LIPASE, AMYLASE,  in the last 72 hours CBC:  Recent Labs  11/25/13 1400 11/26/13 0500  WBC 4.5 4.1  NEUTROABS  --  2.6  HGB 10.3* 10.1*  HCT 32.5* 32.0*  MCV 92.1 91.7  PLT 144* 128*   Cardiac Enzymes: No results found for this basename: CKTOTAL, CKMB, CKMBINDEX, TROPONINI,  in the last 72 hours BNP: No components found with this basename: POCBNP,  D-Dimer: No results found for this basename: DDIMER,  in the last 72 hours Hemoglobin A1C: No results found for this basename: HGBA1C,  in the last 72 hours Fasting Lipid Panel: No results found for this basename: CHOL, HDL, LDLCALC, TRIG, CHOLHDL, LDLDIRECT,  in the last 72 hours Thyroid Function Tests:  Recent Labs  11/25/13 1400  TSH 5.370*   Anemia Panel: No results found for this basename: VITAMINB12, FOLATE, FERRITIN, TIBC, IRON, RETICCTPCT,  in the last 72 hours  RADIOLOGY: Mm Digital Screening  11/23/2013   CLINICAL DATA:  Screening.  EXAM: DIGITAL SCREENING BILATERAL  MAMMOGRAM WITH CAD  COMPARISON:  Previous exam(s).  ACR Breast Density Category b: There are scattered areas of fibroglandular density.  FINDINGS: There are no findings suspicious for malignancy. Images were processed with CAD.  IMPRESSION: No mammographic evidence of malignancy. A result letter of this screening mammogram will be mailed directly to the patient.  RECOMMENDATION: Screening mammogram in one year. (Code:SM-B-01Y)  BI-RADS CATEGORY  1: Negative.   Electronically Signed   By: Shon Hale M.D.   On: 11/23/2013 12:59    PHYSICAL EXAM General: NAD Neck: No JVD, no thyromegaly or thyroid nodule.  Lungs: Clear to auscultation bilaterally with normal respiratory effort. CV: Nondisplaced PMI.  Heart regular S1/S2, no S3/S4, 2/6 HSM apex.  No peripheral edema.  No carotid bruit.  Normal pedal pulses.  Abdomen: Soft, nontender, no hepatosplenomegaly, no distention.  Neurologic: Alert and oriented x 3.  Psych: Normal affect. Extremities: No clubbing or cyanosis.   TELEMETRY: Reviewed telemetry pt in sinus bradycardia  ASSESSMENT AND PLAN: 1) Acute/Chronic systolic HF: Ischemic cardiomyopathy.  EF 15%, severe MR (06/2013).  Cardiac output marginal and filling pressures elevated on RHC 9/25, patient started on milrinone.  Suspect she is nearing the need for advanced therapies.  Had transplant evaluation at [facility] scheduled next week.  - Has LBBB, QRS 147 msec.  EP to weigh in on upgrade to CRT.  May also help to pace her atrium when CRT in place as HR running in upper 40s-50s at baseline.  - CVP and PCWP in normal range now.  Will transition to po Lasix, start with 80 mg bid.  - Continue milrinone gtt.  CI improved this morning.  Will leave Swan in today.  Based on her low output symptoms and numbers pre-milrinone, I think that she is likely going to need home milrinone as a bridge to a more definitive treatment.  If numbers look good tomorrow, will probably take Luiz Blare out and replace with PICC.  2)  Severe MR: s/p MV repair with prior CABG.  3) CAD s/p CABG: Stable, no chest pain.   4) H/o VT: Has ICD. Has been on amiodarone 200 mg bid.  Will leave at this dose for now, especially as she is on milrinone.  However, HR is on the lower side (see comments above on possible benefits of atrial pacing if CRT were in place.  5) Gout  6) DM II  7) CKD: Stable creatinine.    Loralie Champagne 11/26/2013 7:52 AM

## 2013-11-27 DIAGNOSIS — I472 Ventricular tachycardia: Secondary | ICD-10-CM

## 2013-11-27 DIAGNOSIS — I2589 Other forms of chronic ischemic heart disease: Secondary | ICD-10-CM

## 2013-11-27 DIAGNOSIS — I4729 Other ventricular tachycardia: Secondary | ICD-10-CM

## 2013-11-27 LAB — CBC
HEMATOCRIT: 33.1 % — AB (ref 36.0–46.0)
HEMOGLOBIN: 10.6 g/dL — AB (ref 12.0–15.0)
MCH: 29 pg (ref 26.0–34.0)
MCHC: 32 g/dL (ref 30.0–36.0)
MCV: 90.4 fL (ref 78.0–100.0)
Platelets: 120 10*3/uL — ABNORMAL LOW (ref 150–400)
RBC: 3.66 MIL/uL — AB (ref 3.87–5.11)
RDW: 15.8 % — ABNORMAL HIGH (ref 11.5–15.5)
WBC: 4.6 10*3/uL (ref 4.0–10.5)

## 2013-11-27 LAB — BASIC METABOLIC PANEL
ANION GAP: 15 (ref 5–15)
BUN: 33 mg/dL — ABNORMAL HIGH (ref 6–23)
CO2: 26 mEq/L (ref 19–32)
Calcium: 9.1 mg/dL (ref 8.4–10.5)
Chloride: 98 mEq/L (ref 96–112)
Creatinine, Ser: 1.48 mg/dL — ABNORMAL HIGH (ref 0.50–1.10)
GFR, EST AFRICAN AMERICAN: 45 mL/min — AB (ref >90)
GFR, EST NON AFRICAN AMERICAN: 38 mL/min — AB (ref >90)
Glucose, Bld: 143 mg/dL — ABNORMAL HIGH (ref 70–99)
POTASSIUM: 4.3 meq/L (ref 3.7–5.3)
Sodium: 139 mEq/L (ref 137–147)

## 2013-11-27 LAB — GLUCOSE, CAPILLARY
Glucose-Capillary: 140 mg/dL — ABNORMAL HIGH (ref 70–99)
Glucose-Capillary: 123 mg/dL — ABNORMAL HIGH (ref 70–99)
Glucose-Capillary: 156 mg/dL — ABNORMAL HIGH (ref 70–99)
Glucose-Capillary: 131 mg/dL — ABNORMAL HIGH (ref 70–99)

## 2013-11-27 LAB — CARBOXYHEMOGLOBIN
CARBOXYHEMOGLOBIN: 1.3 % (ref 0.5–1.5)
METHEMOGLOBIN: 1.1 % (ref 0.0–1.5)
O2 Saturation: 80.4 %
Total hemoglobin: 11.1 g/dL — ABNORMAL LOW (ref 12.0–16.0)

## 2013-11-27 MED ORDER — AMIODARONE HCL 200 MG PO TABS
200.0000 mg | ORAL_TABLET | Freq: Every day | ORAL | Status: DC
  Administered 2013-11-28: 200 mg via ORAL
  Filled 2013-11-27: qty 1

## 2013-11-27 MED ORDER — SODIUM CHLORIDE 0.9 % IJ SOLN: 10.0000 mL | Freq: Two times a day (BID) | INTRAMUSCULAR | Status: DC

## 2013-11-27 MED ORDER — FUROSEMIDE 80 MG PO TABS
80.0000 mg | ORAL_TABLET | Freq: Every day | ORAL | Status: DC
  Administered 2013-11-28: 80 mg via ORAL
  Filled 2013-11-27: qty 1

## 2013-11-27 MED ORDER — SODIUM CHLORIDE 0.9 % IJ SOLN: 10.0000 mL | INTRAMUSCULAR | Status: DC | PRN

## 2013-11-27 MED ORDER — ZOLPIDEM TARTRATE 5 MG PO TABS
5.0000 mg | ORAL_TABLET | Freq: Every evening | ORAL | Status: DC | PRN
  Administered 2013-11-27: 5 mg via ORAL
  Filled 2013-11-27: qty 1

## 2013-11-27 NOTE — Progress Notes (Signed)
Peripherally Inserted Central Catheter/Midline Placement  The IV Nurse has discussed with the patient and/or persons authorized to consent for the patient, the purpose of this procedure and the potential benefits and risks involved with this procedure.  The benefits include less needle sticks, lab draws from the catheter and patient may be discharged home with the catheter.  Risks include, but not limited to, infection, bleeding, blood clot (thrombus formation), and puncture of an artery; nerve damage and irregular heat beat.  Alternatives to this procedure were also discussed.  PICC/Midline Placement Documentation  PICC / Midline Double Lumen 53/74/82 PICC Right Basilic 44 cm 0 cm (Active)  Indication for Insertion or Continuance of Line Prolonged intravenous therapies;Home intravenous therapies (PICC only) 11/27/2013  2:12 PM  Exposed Catheter (cm) 0 cm 11/27/2013  2:12 PM  Site Assessment Clean;Dry;Intact 11/27/2013  2:12 PM  Lumen #1 Status Flushed;Saline locked;Blood return noted 11/27/2013  2:12 PM  Lumen #2 Status Flushed;Saline locked;Blood return noted 11/27/2013  2:12 PM  Dressing Type Transparent 11/27/2013  2:12 PM  Dressing Status Clean;Dry;Intact;Antimicrobial disc in place 11/27/2013  2:12 PM  Line Care Connections checked and tightened 11/27/2013  2:12 PM  Line Adjustment (NICU/IV Team Only) No 11/27/2013  2:12 PM  Dressing Intervention New dressing 11/27/2013  2:12 PM  Dressing Change Due 12/04/13 11/27/2013  2:12 PM       Rolena Infante 11/27/2013, 2:13 PM

## 2013-11-27 NOTE — Consult Note (Signed)
ELECTROPHYSIOLOGY CONSULT NOTE    Patient ID: [name] MRN: [id number], DOB/AGE: [birth year] 58 y.o.  Admit date: 11/25/2013 Date of Consult: 10-03-13  Primary Physician: Arman Filter, MD Primary Cardiologist: CHF clinic Electrophyioslogist: Caryl Comes  Reason for Consultation: CRT upgrade    HPI:  [name] is a 58 y.o. female with a past medical history significant for CAD (s/p CABG), ICM, diabetes, hypertension, systolic heart failure, mitral valve disease (s/p repair at time of CABG), and prior polysubstance abuse (none since 2013).     CRT upgrade had been considered, but with IVCD and QRS 125 or so , i thought not liekly   She had syncope in August associated with polymorphic ventricular tachycardia. She was admitted for catheterization that was complicated by a vagal episode and hypotension. Severe three-vessel native disease present with all bypass grafts patent. She was treated with amiodarone for ventricular tachycardia.   She was admitted the other day feeling "worse than she has ever felt".  She was admitted for right heart catheterization that demonstrated some discordant data. Her markedly elevated filling pressures, RA equals 11, PCW 23. Cardiac index somewhere between 1.9--2.7. Treated with milrinone with some improvement  RA pressure allows--6, wedge, 23--15, Co-OX 80%     We are asked to see her for CRT upgrade as her ECG has continued to evolve now with for left bundle branchlike pattern and a QRS duration of 146 ms     Last echo 06-2013 (TEE) demonstrated EF 15%, severe MR.     EP has been asked to evaluate for treatment options.    ROS is negative except as outlined above.   Past Medical History  Diagnosis Date  . Coronary artery disease     a. s/p CABG x 5 with MV annuloplasty 2007   . Diabetes mellitus   . Hypertension   . Chronic systolic heart failure     a. ICM b. ECHO (06/2013): EF 15%, severe MR (06/2013) c. RHC (10/2013): RA 5, RV  23/2/5, PA 25/6 (14), PCWP 12, Fick CO/CI: 3.2/1.7, PVR 0.7 WU, PA 45%, 47% and 55% (with levophed 5), vagal response during cath   . Polysubstance abuse     history of  (cocaine, tobacco and ETOH)  . V-tach   . Ischemic cardiomyopathy     a. s/p ICD b. LHC (10/04/13): 1. Severe native CAD with all bypass grafts patent       Surgical History:  Past Surgical History  Procedure Laterality Date  . Coronary artery bypass graft    . Cardiac catheterization    . Cardiac defibrillator placement      2011, Followed By Dr. Caryl Comes  . Tee without cardioversion N/A 06/02/2013    Procedure: TRANSESOPHAGEAL ECHOCARDIOGRAM (TEE);  Surgeon: Larey Dresser, MD;  Location: [place];  Service: Cardiovascular;  Laterality: N/A;     Prescriptions prior to admission  Medication Sig Dispense Refill  . allopurinol (ZYLOPRIM) 100 MG tablet Take 100-200 mg by mouth 2 (two) times daily. Takes 200 mg in the morning and 100 mg in the evening      . amiodarone (PACERONE) 200 MG tablet Take 200 mg by mouth 2 (two) times daily.      [name] aspirin EC 81 MG tablet Take 81 mg by mouth daily.      [name] atorvastatin (LIPITOR) 80 MG tablet Take 1 tablet (80 mg total) by mouth daily.  30 tablet  11  . digoxin (LANOXIN) 0.125 MG tablet Take 0.0625 mg  by mouth every other day.      . diphenhydrAMINE (BENADRYL) 25 mg capsule Take 50 mg by mouth at bedtime.       . fluticasone (FLONASE) 50 MCG/ACT nasal spray Place 1 spray into both nostrils daily.  16 g  2  . furosemide (LASIX) 40 MG tablet Take 80 mg by mouth daily. 80 mg (2 tablets) in the am. If weight increases more than 3 lbs day or 5 lbs week take extra 40 mg (1 tablet)      . insulin glargine (LANTUS) 100 UNIT/ML injection Inject 0.3 mLs (30 Units total) into the skin at bedtime.  10 mL  11  . insulin lispro (HUMALOG) 100 UNIT/ML injection Inject 6 Units into the skin 3 (three) times daily as needed for high blood sugar.      . lisinopril (PRINIVIL,ZESTRIL) 5 MG tablet Take  2.5 mg by mouth at bedtime.      . potassium chloride SA (K-DUR,KLOR-CON) 20 MEQ tablet Take 20 mEq by mouth daily.      . nitroGLYCERIN (NITROSTAT) 0.4 MG SL tablet Place 1 tablet (0.4 mg total) under the tongue every 5 (five) minutes as needed. For chest pain.  25 tablet  11    Inpatient Medications:  . allopurinol  100 mg Oral QHS  . allopurinol  200 mg Oral Daily  . amiodarone  200 mg Oral BID  . aspirin EC  81 mg Oral Daily  . atorvastatin  80 mg Oral Daily  . digoxin  0.0625 mg Oral QODAY  . enoxaparin (LOVENOX) injection  40 mg Subcutaneous Q24H  . fluticasone  1 spray Each Nare Daily  . [START ON 11/28/2013] furosemide  80 mg Oral Daily  . insulin aspart  0-20 Units Subcutaneous TID WC  . potassium chloride SA  20 mEq Oral Daily  . sodium chloride  3 mL Intravenous Q12H    Allergies: No Known Allergies  History   Social History  . Marital Status: Widowed    Spouse Name: N/A    Number of Children: N/A  . Years of Education: N/A   Occupational History  . disable    Social History Main Topics  . Smoking status: Former Smoker    Quit date: 03/03/2011  . Smokeless tobacco: Not on file  . Alcohol Use: Yes     Comment: Had some drinks New Years, describes social drinkingC  . Drug Use: No     Comment: Has history of cocaine use, denies recent  . Sexual Activity: Not Currently   Other Topics Concern  . Not on file   Social History Narrative  . No narrative on file     Family History  Problem Relation Age of Onset  . Heart attack Mother     Died age 82  . Diabetes Maternal Grandmother   . Heart attack Cousin   . Heart attack Maternal Uncle      BP 113/49  Pulse 58  Temp(Src) 97.7 F (36.5 C) (Core (Comment))  Resp 13  Ht 5\' 4"  (1.626 m)  Wt 183 lb 3.2 oz (83.1 kg)  BMI 31.43 kg/m2  SpO2 98%  Physical Exam: Alert and oriented in no acute distress HENT- normal Eyes- EOMI, without scleral icterus Skin- warm and dry; without rashes LN-neg Neck-  supple without thyromegaly, JVP-flat, carotids brisk and full without bruits Back-without CVAT or kyphosis Lungs-clear to auscultation CV-Regular rate and rhythm, nl S1 and S2, 3/6 murmur  ]Abd-soft with active bowel sounds; no  midline pulsation or hepatomegaly Pulses-intact femoral and distal MKS-without gross deformity Neuro- Ax O, CN3-12 intact, grossly normal motor and sensory function Affect engaging   Labs:    Lab Results  Component Value Date   WBC 4.6 11/27/2013   HGB 10.6* 11/27/2013   HCT 33.1* 11/27/2013   MCV 90.4 11/27/2013   PLT 120* 11/27/2013     Recent Labs Lab 11/27/13 0454  NA 139  K 4.3  CL 98  CO2 26  BUN 33*  CREATININE 1.48*  CALCIUM 9.1  GLUCOSE 143*    Radiology/Studies: Dg Hip Complete Left 09/10/2013   CLINICAL DATA:  Left hip pain  EXAM: LEFT HIP - COMPLETE 2+ VIEW  COMPARISON:  None.  FINDINGS: Mild degenerative changes of the hip joints are noted bilaterally. The pelvic ring is intact. No acute fracture or dislocation is seen. No soft tissue abnormality is noted.  IMPRESSION: Degenerative change without acute abnormality.   Electronically Signed   By: Inez Catalina M.D.   On: 09/10/2013 09:08   Ct Head Wo Contrast 10/02/2013   CLINICAL DATA:  Headache.  Fell and hit head.  EXAM: CT HEAD WITHOUT CONTRAST  CT MAXILLOFACIAL WITHOUT CONTRAST  TECHNIQUE: Multidetector CT imaging of the head and maxillofacial structures were performed using the standard protocol without intravenous contrast. Multiplanar CT image reconstructions of the maxillofacial structures were also generated.  COMPARISON:  None.  FINDINGS: CT HEAD FINDINGS  Mild chronic microvascular ischemia. Ventricle size is normal. Negative for acute infarct. Negative for hemorrhage or mass. Negative for skull fracture. Atherosclerotic disease.  CT MAXILLOFACIAL FINDINGS  Possible fracture of the right orbit which is mildly depressed with mild soft tissue swelling. This could be due to acute fracture. No  fracture of the nasal bone or mandible.  Mild mucosal edema in the paranasal sinuses without air-fluid level.  IMPRESSION: No acute intracranial abnormality.  Possible mildly depressed fracture right orbital floor.   Electronically Signed   By: Franchot Gallo M.D.   On: 10/02/2013 22:41    XEN:[id number]  QRS duration 146 with monophasic R waves lead 1, L.     DEVICE HISTORY:  Interrogation pending   Assessment/Plan:  VT  Polymorphic  ICD Medtronic  Ischemic Cardiomyopathy//Severe MR Prior CABG  Congestive heart failure-acute on  chronic-systolic-class IIIB  There has been interval worsening of QRS duration was a pattern more consistent with left bundle branch block. Interestingly, her deterioration is coincidental with the initiation of amiodarone for polymorphic ventricular tachycardia, however, having said that, her right heart cath was markedly abnormal with very elevated filling pressures which are significantly improved on milrinone.  We will repeat the ECG today to assess QRS duration.  She has a transplant evaluation at [facility] on Tuesday. We could potentially do her device upgrade the ECG remains supportive of that intervention later this week. We'll discuss this with Dr. Reine Just in the morning

## 2013-11-27 NOTE — Progress Notes (Signed)
Patient ID: [name], female   DOB: 09-28-1955, 58 y.o.   MRN: [id number]    58 year old female with history of HTN, DM2, past polysubstance abuse (ETOH, tobacco, cocaine), CAD s/p CABG x5 with MV annuloplasty (2007), ICM s/p ICD (MDT - no optivol), chronic systolic HF EF 66%, severe MR, CKD stage III and PAD. Blood type O+   Presented in 2007 with acute anterior MI with totalled LAD in setting of cocaine use. At time of cath LAD, LCX and RCA occluded. EF 45%. Had abrupt stent occlusion the next day and had to go back to the lab. 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   Admitted 8/2-8/7 for syncope s/p ICD shock. Concern that VT was possibly from ischemia and taken for [facility]. During cath patient had vagal response and BP dropped to 50s and co-ox was in the upper 40s. Placed on Levophed and transferred to floor. Started on Amiodarone.   9/24 she was evaluated in HF clinic and with worsening HF symptoms, specifically profound fatigue with mild dyspnea. Weight was 183 pounds. Plan for heart [facility] evaluation at [facility] next week. She presented today for RHC to further assess hemodynamics. Admitted post cath.   2/15 ECHO EF 15%, diffuse hypokinesis, restrictive diastolic function, s/p MV repair with moderate-severe MR.   3/15 CPX  Peak VO2: 15.6 ml/kg/min (74.5% predicted peak VO2) VE/VCO2 slope: 40.8 OUES: 1.23 Peak RER: 1.20 Ventilatory Threshold: 11.1 (53% predicted peak VO2) Peak RR: 48 Peak Ventilation: 60.8 VE/MVV: 66.3% PETCO2 at peak: 29 O2pulse: 9 (90% predicted O2pulse)   LHC (10/2013)  1) severe native CAD with all bypass grafts patent   11/25/13 RHC  RA = 11  RV = 56/6/11  PA = 57/20 (35)  PCW = 23 with v-waves to 46  Fick cardiac output/index = 5.0/2.7  Thermo CO/CI = 3.5/1.9  PVR = 3.4 WU  FA sat = 94%  PA sat = 60%, 60%  SVC sat = 60%  This morning, she is doing well.  Continued to diurese on po Lasix.  No dyspnea.   Filling pressures good. Creatinine down a bit.  CVP 6 PA 35/8 PCWP 15 CI 2.2 Co-ox 80%   Filed Vitals:   11/27/13 0600 11/27/13 0642 11/27/13 0700 11/27/13 0727  BP: 102/51  96/50   Pulse:      Temp: 97.9 F (36.6 C) 97.5 F (36.4 C) 97.9 F (36.6 C) 98 F (36.7 C)  TempSrc:   Core (Comment)   Resp: 8 16 11    Height:      Weight:      SpO2:        Intake/Output Summary (Last 24 hours) at 11/27/13 0827 Last data filed at 11/27/13 0700  Gross per 24 hour  Intake 1733.3 ml  Output   3150 ml  Net -1416.7 ml    LABS: Basic Metabolic Panel:  Recent Labs  11/26/13 0500 11/26/13 1210 11/27/13 0454  NA 139  --  139  K 3.9  --  4.3  CL 98  --  98  CO2 28  --  26  GLUCOSE 130*  --  143*  BUN 42*  --  33*  CREATININE 1.62*  --  1.48*  CALCIUM 9.2  --  9.1  MG  --  2.4  --    Liver Function Tests: No results found for this basename: AST, ALT, ALKPHOS, BILITOT, PROT, ALBUMIN,  in the last  72 hours No results found for this basename: LIPASE, AMYLASE,  in the last 72 hours CBC:  Recent Labs  11/26/13 0500 11/27/13 0454  WBC 4.1 4.6  NEUTROABS 2.6  --   HGB 10.1* 10.6*  HCT 32.0* 33.1*  MCV 91.7 90.4  PLT 128* 120*   Cardiac Enzymes: No results found for this basename: CKTOTAL, CKMB, CKMBINDEX, TROPONINI,  in the last 72 hours BNP: No components found with this basename: POCBNP,  D-Dimer: No results found for this basename: DDIMER,  in the last 72 hours Hemoglobin A1C: No results found for this basename: HGBA1C,  in the last 72 hours Fasting Lipid Panel: No results found for this basename: CHOL, HDL, LDLCALC, TRIG, CHOLHDL, LDLDIRECT,  in the last 72 hours Thyroid Function Tests:  Recent Labs  11/25/13 1400  TSH 5.370*   Anemia Panel: No results found for this basename: VITAMINB12, FOLATE, FERRITIN, TIBC, IRON, RETICCTPCT,  in the last 72 hours  RADIOLOGY: Mm Digital Screening  11/23/2013   CLINICAL DATA:  Screening.  EXAM: DIGITAL SCREENING  BILATERAL MAMMOGRAM WITH CAD  COMPARISON:  Previous exam(s).  ACR Breast Density Category b: There are scattered areas of fibroglandular density.  FINDINGS: There are no findings suspicious for malignancy. Images were processed with CAD.  IMPRESSION: No mammographic evidence of malignancy. A result letter of this screening mammogram will be mailed directly to the patient.  RECOMMENDATION: Screening mammogram in one year. (Code:SM-B-01Y)  BI-RADS CATEGORY  1: Negative.   Electronically Signed   By: Shon Hale M.D.   On: 11/23/2013 12:59    PHYSICAL EXAM General: NAD Neck: No JVD, no thyromegaly or thyroid nodule.  Lungs: Clear to auscultation bilaterally with normal respiratory effort. CV: Nondisplaced PMI.  Heart regular S1/S2, no S3/S4, 2/6 HSM apex.  No peripheral edema.  No carotid bruit.  Normal pedal pulses.  Abdomen: Soft, nontender, no hepatosplenomegaly, no distention.  Neurologic: Alert and oriented x 3.  Psych: Normal affect. Extremities: No clubbing or cyanosis.   TELEMETRY: Reviewed telemetry pt in sinus bradycardia  ASSESSMENT AND PLAN: 1) Acute/Chronic systolic HF: Ischemic cardiomyopathy.  EF 15%, severe MR (06/2013).  Cardiac output marginal and filling pressures elevated on RHC 9/25, patient started on milrinone.  Suspect she is nearing the need for advanced therapies.  Had transplant evaluation at [facility] scheduled next week.  - Has LBBB, QRS 147 msec.  EP to weigh in on upgrade to CRT => Dr Caryl Comes to see today.  May also help to pace her atrium when CRT in place as HR running in upper 40s-50s at baseline.  - CVP and PCWP in normal range now.  Transitioned to po Lasix, diuresed again well yesterday.  Will leave Lasix at her home dose 80 mg daily.  - Continue milrinone gtt. Based on her low output symptoms and numbers pre-milrinone, I think that she is likely going to need home milrinone as a bridge to a more definitive treatment.  CO improved on milrinone.  Will d/c Swan and place  PICC. 2) Severe MR: s/p MV repair with prior CABG.  3) CAD s/p CABG: Stable, no chest pain.   4) H/o VT: Has ICD. Has been on amiodarone.  However, HR is on the lower side (see comments above on possible benefits of atrial pacing if CRT were in place).  5) Gout  6) DM II  7) CKD: Stable creatinine.    [name] 11/27/2013 8:27 AM

## 2013-11-28 ENCOUNTER — Telehealth: Payer: Self-pay | Admitting: *Deleted

## 2013-11-28 ENCOUNTER — Encounter: Payer: Self-pay | Admitting: *Deleted

## 2013-11-28 ENCOUNTER — Encounter (HOSPITAL_COMMUNITY): Payer: Self-pay | Admitting: Internal Medicine

## 2013-11-28 DIAGNOSIS — Z4502 Encounter for adjustment and management of automatic implantable cardiac defibrillator: Secondary | ICD-10-CM

## 2013-11-28 DIAGNOSIS — I447 Left bundle-branch block, unspecified: Secondary | ICD-10-CM | POA: Diagnosis present

## 2013-11-28 LAB — BASIC METABOLIC PANEL
ANION GAP: 11 (ref 5–15)
BUN: 28 mg/dL — ABNORMAL HIGH (ref 6–23)
CO2: 26 meq/L (ref 19–32)
Calcium: 9.1 mg/dL (ref 8.4–10.5)
Chloride: 99 mEq/L (ref 96–112)
Creatinine, Ser: 1.35 mg/dL — ABNORMAL HIGH (ref 0.50–1.10)
GFR calc Af Amer: 50 mL/min — ABNORMAL LOW (ref >90)
GFR calc non Af Amer: 43 mL/min — ABNORMAL LOW (ref >90)
Glucose, Bld: 126 mg/dL — ABNORMAL HIGH (ref 70–99)
POTASSIUM: 4.2 meq/L (ref 3.7–5.3)
SODIUM: 136 meq/L — AB (ref 137–147)

## 2013-11-28 LAB — CARBOXYHEMOGLOBIN
CARBOXYHEMOGLOBIN: 1.6 % — AB (ref 0.5–1.5)
Carboxyhemoglobin: 1.3 % (ref 0.5–1.5)
METHEMOGLOBIN: 0.4 % (ref 0.0–1.5)
Methemoglobin: 0.9 % (ref 0.0–1.5)
O2 SAT: 70 %
O2 Saturation: 57.4 %
TOTAL HEMOGLOBIN: 10.2 g/dL — AB (ref 12.0–16.0)
Total hemoglobin: 10.6 g/dL — ABNORMAL LOW (ref 12.0–16.0)

## 2013-11-28 LAB — GLUCOSE, CAPILLARY
Glucose-Capillary: 112 mg/dL — ABNORMAL HIGH (ref 70–99)
Glucose-Capillary: 121 mg/dL — ABNORMAL HIGH (ref 70–99)

## 2013-11-28 MED ORDER — MILRINONE IN DEXTROSE 20 MG/100ML IV SOLN: 0.2500 ug/kg/min | INTRAVENOUS | Status: DC

## 2013-11-28 NOTE — Discharge Summary (Signed)
Advanced Heart Failure Team  Discharge Summary   Patient ID: [name] MRN: [id number], DOB/AGE: November 24, 1955 58 y.o. Admit date: 11/25/2013 D/C date:     11/28/2013   Primary Discharge Diagnoses:  1) A/C systolic HF - EF 65% (06/6501) - RHC showed marginal CI and started on milrinone 0.25 mcg  Secondary Discharge Diagnoses:  1) Severe MR - s/p MV repair with prior CABG 2) ICM - s/p CABG 3) H/o VT - s/p ICD. On amiodarone - Planned CRT-D 12/02/13 4) DM2 5) LBBB 6) CKD stage III  Hospital Course:   58 year old female with history of HTN, DM2, past polysubstance abuse (ETOH, tobacco, cocaine), CAD s/p CABG x5 with MV annuloplasty (2007), ICM s/p ICD (MDT - no optivol), chronic systolic HF EF 54%, severe MR, CKD stage III and PAD. Blood type O+   Admitted 8/2-10/07/13 for syncope s/p ICD shock. Concern that VT was possibly from ischemia and taken for [facility]. During cath patient had vagal response and BP dropped to 50s and co-ox was in the upper 40s. Placed on Levophed and transferred to floor. Started on Amiodarone.  Presented to the HF clinic on 11/24/13 with increased fatigue. Concerns for low output and she was set up for planned RHC on 9/25 to assess her hemodynamics. On am of her cath her renal function was 1.7, dig level 0.4 and TSH 5.37. Free T4 pending. Her catheterization showed elevated filling pressures and marginal CI by Fick (Full report below). She was admitted for diuresis and started on IV milrinone 0.25 mcg. She diuresed with IV lasix and her filling pressures normalized and her CI improved with milrinone. A PICC was placed in order to go home with home milrinone and co-ox improved to 70%. There was thought with recent QRS widening and LBBB for CRT-D upgrade and EP saw the patient and the plan is to admit her on 10/2 for CRT-D upgrade.   On day of discharge her renal function had trended down and her Creatinine was 1.35. She denied SOB, orthopnea or CP. She was ambulating in  the halls with no issues and her VSS. She will be followed by [place] for management of her milrinone and has a transplant evaluation tomorrow at [facility].   Discharge Weight Range: 183-186 lbs Discharge Vitals: Blood pressure 114/66, pulse 62, temperature 98.6 F (37 C), temperature source Oral, resp. rate 15, height 5\' 4"  (1.626 m), weight 186 lb 4.6 oz (84.5 kg), SpO2 96.00%.  Labs: Lab Results  Component Value Date   WBC 4.6 11/27/2013   HGB 10.6* 11/27/2013   HCT 33.1* 11/27/2013   MCV 90.4 11/27/2013   PLT 120* 11/27/2013     Recent Labs Lab 11/28/13 0500  NA 136*  K 4.2  CL 99  CO2 26  BUN 28*  CREATININE 1.35*  CALCIUM 9.1  GLUCOSE 126*   Lab Results  Component Value Date   CHOL 140 06/07/2013   HDL 36.40* 06/07/2013   LDLCALC 82 06/07/2013   TRIG 106.0 06/07/2013   BNP (last 3 results)  Recent Labs  06/07/13 1041 11/24/13 1236  PROBNP 899.80* 1349.0*    Diagnostic Studies/Procedures   11/25/13 RHC RA = 11  RV = 56/6/11  PA = 57/20 (35)  PCW = 23 with v-waves to 36  Fick cardiac output/index = 5.0/2.7  Thermo CO/CI = 3.5/1.9  PVR = 3.4 WU  FA sat = 94%  PA sat = 60%, 60%  SVC sat = 60%  Discharge Medications     Medication List    STOP taking these medications       lisinopril 5 MG tablet  Commonly known as:  PRINIVIL,ZESTRIL      TAKE these medications       allopurinol 100 MG tablet  Commonly known as:  ZYLOPRIM  Take 100-200 mg by mouth 2 (two) times daily. Takes 200 mg in the morning and 100 mg in the evening     amiodarone 200 MG tablet  Commonly known as:  PACERONE  Take 200 mg by mouth 2 (two) times daily.     aspirin EC 81 MG tablet  Take 81 mg by mouth daily.     atorvastatin 80 MG tablet  Commonly known as:  LIPITOR  Take 1 tablet (80 mg total) by mouth daily.     digoxin 0.125 MG tablet  Commonly known as:  LANOXIN  Take 0.0625 mg by mouth every other day.     diphenhydrAMINE 25 mg capsule  Commonly known as:   BENADRYL  Take 50 mg by mouth at bedtime.     fluticasone 50 MCG/ACT nasal spray  Commonly known as:  FLONASE  Place 1 spray into both nostrils daily.     furosemide 40 MG tablet  Commonly known as:  LASIX  Take 80 mg by mouth daily. 80 mg (2 tablets) in the am. If weight increases more than 3 lbs day or 5 lbs week take extra 40 mg (1 tablet)     insulin glargine 100 UNIT/ML injection  Commonly known as:  LANTUS  Inject 0.3 mLs (30 Units total) into the skin at bedtime.     insulin lispro 100 UNIT/ML injection  Commonly known as:  HUMALOG  Inject 6 Units into the skin 3 (three) times daily as needed for high blood sugar.     milrinone 20 MG/100ML Soln infusion  Commonly known as:  PRIMACOR  Inject 20.425 mcg/min into the vein continuous.     nitroGLYCERIN 0.4 MG SL tablet  Commonly known as:  NITROSTAT  Place 1 tablet (0.4 mg total) under the tongue every 5 (five) minutes as needed. For chest pain.     potassium chloride SA 20 MEQ tablet  Commonly known as:  K-DUR,KLOR-CON  Take 20 mEq by mouth daily.        Disposition   The patient will be discharged in stable condition to home. Discharge Instructions   Continue PICC at discharge    Complete by:  As directed   Flush PICC line per home health policy:  Yes     Contraindication beta blocker-discharge    Complete by:  As directed   Low output, on milrinone     Contraindication to ACEI at discharge    Complete by:  As directed   Hypotension     Diet - low sodium heart healthy    Complete by:  As directed      Discharge instructions    Complete by:  As directed   Take all medications with you to [facility].  Will call for plan readmission on Friday for upgrade to your ICD.     Heart Failure patients record your daily weight using the same scale at the same time of day    Complete by:  As directed      Increase activity slowly    Complete by:  As directed      STOP any activity that causes chest pain, shortness of  breath, dizziness,  sweating, or exessive weakness    Complete by:  As directed               Duration of Discharge Encounter: Greater than 35 minutes   Signed, Rande Brunt  11/28/2013, 12:11 PM

## 2013-11-28 NOTE — Progress Notes (Signed)
Pt given d/c instructions and verbalizes understanding. HH RN started milrinone gtt prior to d/c. Patient taken to car via [place]. Pt with no complaints at the current time.

## 2013-11-28 NOTE — Progress Notes (Signed)
Patient Name: [name]      SUBJECTIVE: feels well on milrinone  Past Medical History  Diagnosis Date  . Coronary artery disease     a. s/p CABG x 5 with MV annuloplasty 2007   . Diabetes mellitus   . Hypertension   . Chronic systolic heart failure     a. ICM b. ECHO (06/2013): EF 15%, severe MR (06/2013) c. RHC (10/2013): RA 5, RV 23/2/5, PA 25/6 (14), PCWP 12, Fick CO/CI: 3.2/1.7, PVR 0.7 WU, PA 45%, 47% and 55% (with levophed 5), vagal response during cath   . Polysubstance abuse     history of  (cocaine, tobacco and ETOH)  . V-tach   . Ischemic cardiomyopathy     a. s/p ICD b. LHC (10/04/13): 1. Severe native CAD with all bypass grafts patent    . Implantable defibrillator - present     Scheduled Meds:  Scheduled Meds: . allopurinol  100 mg Oral QHS  . allopurinol  200 mg Oral Daily  . amiodarone  200 mg Oral Daily  . aspirin EC  81 mg Oral Daily  . atorvastatin  80 mg Oral Daily  . digoxin  0.0625 mg Oral QODAY  . enoxaparin (LOVENOX) injection  40 mg Subcutaneous Q24H  . fluticasone  1 spray Each Nare Daily  . furosemide  80 mg Oral Daily  . insulin aspart  0-20 Units Subcutaneous TID WC  . potassium chloride SA  20 mEq Oral Daily  . sodium chloride  10-40 mL Intracatheter Q12H  . sodium chloride  3 mL Intravenous Q12H   Continuous Infusions: . milrinone 0.25 mcg/kg/min (11/28/13 0007)   sodium chloride, acetaminophen, morphine injection, nitroGLYCERIN, ondansetron (ZOFRAN) IV, sodium chloride, sodium chloride, traMADol, zolpidem    PHYSICAL EXAM Filed Vitals:   11/28/13 0012 11/28/13 0358 11/28/13 0500 11/28/13 0815  BP: 96/49 98/53  114/66  Pulse:    62  Temp: 98.9 F (37.2 C) 98.2 F (36.8 C)  98.6 F (37 C)  TempSrc: Oral Oral  Oral  Resp: 18 16  15   Height:      Weight:   186 lb 4.6 oz (84.5 kg)   SpO2: 96% 97%  96%    Well developed and nourished in no acute distress HENT normal Neck supple with JVP-flat Clear Regular rate  and rhythm, no murmurs or gallops Abd-soft with active BS No Clubbing cyanosis edema Skin-warm and dry A & Oriented  Grossly normal sensory and motor function Milrinone ongoing  TELEMETRY: Reviewed telemetry pt in sinus:    Intake/Output Summary (Last 24 hours) at 11/28/13 0941 Last data filed at 11/28/13 0815  Gross per 24 hour  Intake 1790.3 ml  Output    750 ml  Net 1040.3 ml    LABS: Basic Metabolic Panel:  Recent Labs Lab 11/24/13 1236 11/25/13 1400 11/26/13 0500 11/26/13 1210 11/27/13 0454 11/28/13 0500  NA 141 141 139  --  139 136*  K 4.8 4.2 3.9  --  4.3 4.2  CL 102 100 98  --  98 99  CO2 23 26 28   --  26 26  GLUCOSE 77 136* 130*  --  143* 126*  BUN 39* 35* 42*  --  33* 28*  CREATININE 1.83* 1.70* 1.62*  --  1.48* 1.35*  CALCIUM 9.4 9.2 9.2  --  9.1 9.1  MG  --   --   --  2.4  --   --  Cardiac Enzymes: No results found for this basename: CKTOTAL, CKMB, CKMBINDEX, TROPONINI,  in the last 72 hours CBC:  Recent Labs Lab 11/24/13 1346 11/25/13 1400 11/26/13 0500 11/27/13 0454  WBC 4.9 4.5 4.1 4.6  NEUTROABS  --   --  2.6  --   HGB 10.7* 10.3* 10.1* 10.6*  HCT 33.9* 32.5* 32.0* 33.1*  MCV 91.9 92.1 91.7 90.4  PLT 157 144* 128* 120*   PROTIME: No results found for this basename: LABPROT, INR,  in the last 72 hours Liver Function Tests: No results found for this basename: AST, ALT, ALKPHOS, BILITOT, PROT, ALBUMIN,  in the last 72 hours No results found for this basename: LIPASE, AMYLASE,  in the last 72 hours BNP: BNP (last 3 results)  Recent Labs  06/07/13 1041 11/24/13 1236  PROBNP 899.80* 1349.0*   D-Dimer: No results found for this basename: DDIMER,  in the last 72 hours Hemoglobin A1C: No results found for this basename: HGBA1C,  in the last 72 hours Fasting Lipid Panel: No results found for this basename: CHOL, HDL, LDLCALC, TRIG, CHOLHDL, LDLDIRECT,  in the last 72 hours Thyroid Function Tests:  Recent Labs  11/25/13 1400    TSH 5.370*      ASSESSMENT AND PLAN:  Active Problems:   Ischemic/ nonischemic cardiomyopathy   Acute on chronic systolic heart failure   Implantable defibrillator   medtronic   LBBB (left bundle branch block)  Plan is for CRT upgrade in this pt ambulatory CIV  Well compensated by Luiz Blare hemodynamics Schedule for Friday  Signed, Virl Axe MD  11/28/2013

## 2013-11-28 NOTE — Progress Notes (Addendum)
Patient ID: [name], female   DOB: August 09, 1955, 58 y.o.   MRN: [id number]    58 year old female with history of HTN, DM2, past polysubstance abuse (ETOH, tobacco, cocaine), CAD s/p CABG x5 with MV annuloplasty (2007), ICM s/p ICD (MDT - no optivol), chronic systolic HF EF 97%, severe MR, CKD stage III and PAD. Blood type O+   Presented in 2007 with acute anterior MI with totalled LAD in setting of cocaine use. At time of cath LAD, LCX and RCA occluded. EF 45%. Had abrupt stent occlusion the next day and had to go back to the lab. 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   2/15 ECHO EF 15%, diffuse hypokinesis, restrictive diastolic function, s/p MV repair with moderate-severe MR.   Admitted 8/2-8/7 for syncope s/p ICD shock. Concern that VT was possibly from ischemia and taken for [facility]. During cath patient had vagal response and BP dropped to 50s and co-ox was in the upper 40s. Placed on Levophed and transferred to floor. Started on Amiodarone.  LHC (10/2013)  1) severe native CAD with all bypass grafts patent   9/24 she was evaluated in HF clinic and with worsening HF symptoms, specifically profound fatigue with mild dyspnea. Weight was 183 pounds. Plan for heart transplant evaluation at [facility] 9/29.  11/25/13 RHC  RA = 11  RV = 56/6/11  PA = 57/20 (35)  PCW = 23 with v-waves to 18  Fick cardiac output/index = 5.0/2.7  Thermo CO/CI = 3.5/1.9  PVR = 3.4 WU  FA sat = 94%  PA sat = 60%, 60%  SVC sat = 60%  Doing well. EP consulted yesterday and considering possible upgrade to ICD to CRT-D. Renal function stable and co-ox 70% on milrinone 0.25 mcg. Denies SOB, orthopnea or CP. PICC placed yesterday. Weight up 3 lbs.   CVP 6  Filed Vitals:   11/27/13 2200 11/28/13 0012 11/28/13 0358 11/28/13 0500  BP: 108/65 96/49 98/53    Pulse:      Temp:  98.9 F (37.2 C) 98.2 F (36.8 C)   TempSrc:  Oral Oral   Resp: 18 18 16    Height:      Weight:     186 lb 4.6 oz (84.5 kg)  SpO2: 98% 96% 97%     Intake/Output Summary (Last 24 hours) at 11/28/13 9892 Last data filed at 11/28/13 0600  Gross per 24 hour  Intake 1790.3 ml  Output   1150 ml  Net  640.3 ml    LABS: Basic Metabolic Panel:  Recent Labs  11/26/13 1210 11/27/13 0454 11/28/13 0500  NA  --  139 136*  K  --  4.3 4.2  CL  --  98 99  CO2  --  26 26  GLUCOSE  --  143* 126*  BUN  --  33* 28*  CREATININE  --  1.48* 1.35*  CALCIUM  --  9.1 9.1  MG 2.4  --   --    Liver Function Tests: No results found for this basename: AST, ALT, ALKPHOS, BILITOT, PROT, ALBUMIN,  in the last 72 hours No results found for this basename: LIPASE, AMYLASE,  in the last 72 hours CBC:  Recent Labs  11/26/13 0500 11/27/13 0454  WBC 4.1 4.6  NEUTROABS 2.6  --   HGB 10.1* 10.6*  HCT 32.0* 33.1*  MCV 91.7 90.4  PLT 128* 120*   Cardiac Enzymes: No results found  for this basename: CKTOTAL, CKMB, CKMBINDEX, TROPONINI,  in the last 72 hours BNP: No components found with this basename: POCBNP,  D-Dimer: No results found for this basename: DDIMER,  in the last 72 hours Hemoglobin A1C: No results found for this basename: HGBA1C,  in the last 72 hours Fasting Lipid Panel: No results found for this basename: CHOL, HDL, LDLCALC, TRIG, CHOLHDL, LDLDIRECT,  in the last 72 hours Thyroid Function Tests:  Recent Labs  11/25/13 1400  TSH 5.370*   Anemia Panel: No results found for this basename: VITAMINB12, FOLATE, FERRITIN, TIBC, IRON, RETICCTPCT,  in the last 72 hours  RADIOLOGY: Mm Digital Screening  11/23/2013   CLINICAL DATA:  Screening.  EXAM: DIGITAL SCREENING BILATERAL MAMMOGRAM WITH CAD  COMPARISON:  Previous exam(s).  ACR Breast Density Category b: There are scattered areas of fibroglandular density.  FINDINGS: There are no findings suspicious for malignancy. Images were processed with CAD.  IMPRESSION: No mammographic evidence of malignancy. A result letter of this screening  mammogram will be mailed directly to the patient.  RECOMMENDATION: Screening mammogram in one year. (Code:[id number]-B-01Y)  BI-RADS CATEGORY  1: Negative.   Electronically Signed   By: Shon Hale M.D.   On: 11/23/2013 12:59    PHYSICAL EXAM General: NAD, lying flat Neck: No JVD, no thyromegaly or thyroid nodule.  Lungs: Clear to auscultation bilaterally with normal respiratory effort. CV: Nondisplaced PMI.  Heart regular S1/S2, no S3/S4, 2/6 HSM apex.  No peripheral edema.  No carotid bruit.  Normal pedal pulses.  Abdomen: Soft, nontender, no hepatosplenomegaly, no distention.  Neurologic: Alert and oriented x 3.  Psych: Normal affect. Extremities: No clubbing or cyanosis.   TELEMETRY: Reviewed telemetry pt in sinus bradycardia  ASSESSMENT AND PLAN: 1) Acute/Chronic systolic HF: Ischemic cardiomyopathy.  EF 15%, severe MR (06/2013).  Cardiac output marginal and filling pressures elevated on RHC 9/25, patient started on milrinone.  Suspect she is nearing the need for advanced therapies.  Has transplant evaluation at [facility] tomorrow (9/29). - Has LBBB, QRS 146 msec. EP consulted yesterday and awaiting whether we will upgrade to CRT-D after tx evaluation. May also help to pace her atrium when CRT in place as HR running in upper 40s-50s at baseline.  - CVP stable. Will continue lasix 80 mg daily.   .  - Continue milrinone gtt. Based on her low output symptoms and numbers pre-milrinone, I think that she is likely going to need home milrinone as a bridge to a more definitive treatment.  CO improved on milrinone.  Will place for [facility] and orders for home with milrinone.  2) Severe MR: s/p MV repair with prior CABG.  3) CAD s/p CABG: Stable, no chest pain.   4) H/o VT: Has ICD. Has been on amiodarone.  However, HR is on the lower side (see comments above on possible benefits of atrial pacing if CRT were in place).  5) Gout  6) DM II  7) CKD: Stable creatinine.    Home today and to [facility] tomorrow for tx  evaluation. Will need to discuss whether to admit next week for CRT-D upgrade.   [name] B NP-C 11/28/2013 7:02 AM  Patient seen with NP, plan home on milrinone today.  Good co-ox today.  Plan to go to [facility] tomorrow for transplant evaluation.  We will plan CRT-D upgrade on Friday.  Followup in office next week.   [name] 11/28/2013 7:33 AM

## 2013-11-28 NOTE — Telephone Encounter (Signed)
Patient was scheduled for CRT-D upgrade through hospital. I called her while still there (plan to d/c today) to discuss instructions. Reviewed instructions with patient - NPO after MN the night before, 1/2 her usual dose of Lantus the night before, no Insulin or Lasix morning of procedure, wash chest and neck night before and morning of.  Wound check arranged for 10/14 at 3:30 pm. Patient states that she is leaving hospital today on Milrinone gtt. I explained that I would review with Dr. Caryl Comes and call her with any orders for this medication. I will mail her letter informing her of wound check appt., per her request. Patient verbalized understanding and agreeable to plan.

## 2013-11-28 NOTE — Progress Notes (Signed)
[facility]  Patient Status: new pt this admission for [facility]  [facility] is providing the following services: HHRN and Home Infusion Pharm[facility] for Home Milrinone therapy. [facility] infusion Coordinator will provide in hospital teaching re: home milrinone to support transition home. [facility] prepared for DC home today 11-28-13.   If patient discharges after hours, please call [phone].   Larry Sierras 11/28/2013, 10:08 AM

## 2013-11-29 DIAGNOSIS — I2510 Atherosclerotic heart disease of native coronary artery without angina pectoris: Secondary | ICD-10-CM | POA: Diagnosis not present

## 2013-11-29 DIAGNOSIS — I739 Peripheral vascular disease, unspecified: Secondary | ICD-10-CM | POA: Diagnosis not present

## 2013-11-29 DIAGNOSIS — Z95810 Presence of automatic (implantable) cardiac defibrillator: Secondary | ICD-10-CM | POA: Diagnosis not present

## 2013-11-29 DIAGNOSIS — Z951 Presence of aortocoronary bypass graft: Secondary | ICD-10-CM | POA: Diagnosis not present

## 2013-11-29 DIAGNOSIS — E119 Type 2 diabetes mellitus without complications: Secondary | ICD-10-CM | POA: Diagnosis not present

## 2013-11-29 DIAGNOSIS — I5022 Chronic systolic (congestive) heart failure: Secondary | ICD-10-CM | POA: Diagnosis not present

## 2013-11-30 ENCOUNTER — Encounter (HOSPITAL_COMMUNITY): Payer: Self-pay | Admitting: Pharmacy Technician

## 2013-11-30 ENCOUNTER — Other Ambulatory Visit (HOSPITAL_COMMUNITY): Payer: Self-pay | Admitting: Internal Medicine

## 2013-11-30 DIAGNOSIS — N183 Chronic kidney disease, stage 3 (moderate): Secondary | ICD-10-CM | POA: Diagnosis not present

## 2013-11-30 DIAGNOSIS — I349 Nonrheumatic mitral valve disorder, unspecified: Secondary | ICD-10-CM | POA: Diagnosis not present

## 2013-11-30 DIAGNOSIS — I255 Ischemic cardiomyopathy: Secondary | ICD-10-CM | POA: Diagnosis not present

## 2013-11-30 DIAGNOSIS — I2510 Atherosclerotic heart disease of native coronary artery without angina pectoris: Secondary | ICD-10-CM | POA: Diagnosis not present

## 2013-11-30 DIAGNOSIS — Z452 Encounter for adjustment and management of vascular access device: Secondary | ICD-10-CM | POA: Diagnosis not present

## 2013-11-30 DIAGNOSIS — I5023 Acute on chronic systolic (congestive) heart failure: Secondary | ICD-10-CM | POA: Diagnosis not present

## 2013-11-30 DIAGNOSIS — I509 Heart failure, unspecified: Secondary | ICD-10-CM | POA: Diagnosis not present

## 2013-11-30 DIAGNOSIS — E119 Type 2 diabetes mellitus without complications: Secondary | ICD-10-CM | POA: Diagnosis not present

## 2013-11-30 DIAGNOSIS — I129 Hypertensive chronic kidney disease with stage 1 through stage 4 chronic kidney disease, or unspecified chronic kidney disease: Secondary | ICD-10-CM | POA: Diagnosis not present

## 2013-11-30 DIAGNOSIS — Z95810 Presence of automatic (implantable) cardiac defibrillator: Secondary | ICD-10-CM | POA: Diagnosis not present

## 2013-11-30 DIAGNOSIS — M109 Gout, unspecified: Secondary | ICD-10-CM | POA: Diagnosis not present

## 2013-11-30 DIAGNOSIS — Z87891 Personal history of nicotine dependence: Secondary | ICD-10-CM | POA: Diagnosis not present

## 2013-11-30 NOTE — Telephone Encounter (Signed)
Confirmed IV access is on right side - patient confirmed. She is aware that Milrinone gtt will continue.

## 2013-12-01 MED ORDER — SODIUM CHLORIDE 0.9 % IV SOLN: INTRAVENOUS | Status: DC

## 2013-12-01 MED ORDER — SODIUM CHLORIDE 0.9 % IR SOLN
80.0000 mg | Status: DC
  Filled 2013-12-01: qty 2

## 2013-12-01 MED ORDER — CEFAZOLIN SODIUM-DEXTROSE 2-3 GM-% IV SOLR: 2.0000 g | INTRAVENOUS | Status: DC

## 2013-12-02 ENCOUNTER — Ambulatory Visit (HOSPITAL_COMMUNITY)
Admission: RE | Admit: 2013-12-02 | Discharge: 2013-12-03 | Disposition: A | Discharge status: Home / Self Care | Payer: Medicare Other | Source: Ambulatory Visit | Attending: Internal Medicine | Admitting: Internal Medicine

## 2013-12-02 ENCOUNTER — Encounter (HOSPITAL_COMMUNITY)
Admission: RE | Disposition: A | Discharge status: Home / Self Care | Payer: Self-pay | Source: Ambulatory Visit | Attending: Internal Medicine

## 2013-12-02 ENCOUNTER — Ambulatory Visit (HOSPITAL_COMMUNITY): Payer: Medicare Other

## 2013-12-02 ENCOUNTER — Encounter (HOSPITAL_COMMUNITY): Payer: Self-pay | Admitting: *Deleted

## 2013-12-02 DIAGNOSIS — I10 Essential (primary) hypertension: Secondary | ICD-10-CM | POA: Diagnosis not present

## 2013-12-02 DIAGNOSIS — Z7982 Long term (current) use of aspirin: Secondary | ICD-10-CM | POA: Diagnosis not present

## 2013-12-02 DIAGNOSIS — Z794 Long term (current) use of insulin: Secondary | ICD-10-CM | POA: Insufficient documentation

## 2013-12-02 DIAGNOSIS — I472 Ventricular tachycardia: Secondary | ICD-10-CM | POA: Insufficient documentation

## 2013-12-02 DIAGNOSIS — E119 Type 2 diabetes mellitus without complications: Secondary | ICD-10-CM | POA: Insufficient documentation

## 2013-12-02 DIAGNOSIS — I447 Left bundle-branch block, unspecified: Secondary | ICD-10-CM | POA: Insufficient documentation

## 2013-12-02 DIAGNOSIS — Z87891 Personal history of nicotine dependence: Secondary | ICD-10-CM | POA: Insufficient documentation

## 2013-12-02 DIAGNOSIS — Z950 Presence of cardiac pacemaker: Secondary | ICD-10-CM | POA: Diagnosis not present

## 2013-12-02 DIAGNOSIS — Z4682 Encounter for fitting and adjustment of non-vascular catheter: Secondary | ICD-10-CM | POA: Diagnosis not present

## 2013-12-02 DIAGNOSIS — I509 Heart failure, unspecified: Secondary | ICD-10-CM | POA: Diagnosis not present

## 2013-12-02 DIAGNOSIS — T82190A Other mechanical complication of cardiac electrode, initial encounter: Secondary | ICD-10-CM | POA: Diagnosis not present

## 2013-12-02 DIAGNOSIS — Z951 Presence of aortocoronary bypass graft: Secondary | ICD-10-CM | POA: Diagnosis not present

## 2013-12-02 DIAGNOSIS — Z79899 Other long term (current) drug therapy: Secondary | ICD-10-CM | POA: Insufficient documentation

## 2013-12-02 DIAGNOSIS — I517 Cardiomegaly: Secondary | ICD-10-CM | POA: Diagnosis not present

## 2013-12-02 DIAGNOSIS — I255 Ischemic cardiomyopathy: Secondary | ICD-10-CM | POA: Diagnosis not present

## 2013-12-02 DIAGNOSIS — Z95810 Presence of automatic (implantable) cardiac defibrillator: Secondary | ICD-10-CM | POA: Diagnosis not present

## 2013-12-02 DIAGNOSIS — Z4502 Encounter for adjustment and management of automatic implantable cardiac defibrillator: Secondary | ICD-10-CM

## 2013-12-02 DIAGNOSIS — I259 Chronic ischemic heart disease, unspecified: Secondary | ICD-10-CM | POA: Diagnosis not present

## 2013-12-02 DIAGNOSIS — I5022 Chronic systolic (congestive) heart failure: Secondary | ICD-10-CM | POA: Diagnosis not present

## 2013-12-02 HISTORY — PX: BI-VENTRICULAR IMPLANTABLE CARDIOVERTER DEFIBRILLATOR UPGRADE: SHX5749

## 2013-12-02 HISTORY — PX: BI-VENTRICULAR IMPLANTABLE CARDIOVERTER DEFIBRILLATOR UPGRADE: SHX5461

## 2013-12-02 LAB — GLUCOSE, CAPILLARY
GLUCOSE-CAPILLARY: 115 mg/dL — AB (ref 70–99)
GLUCOSE-CAPILLARY: 73 mg/dL (ref 70–99)
Glucose-Capillary: 235 mg/dL — ABNORMAL HIGH (ref 70–99)
Glucose-Capillary: 136 mg/dL — ABNORMAL HIGH (ref 70–99)

## 2013-12-02 SURGERY — BI-VENTRICULAR IMPLANTABLE CARDIOVERTER DEFIBRILLATOR UPGRADE
Anesthesia: LOCAL

## 2013-12-02 MED ORDER — ALLOPURINOL 100 MG PO TABS: 100.0000 mg | ORAL_TABLET | Freq: Two times a day (BID) | ORAL | Status: DC

## 2013-12-02 MED ORDER — FENTANYL CITRATE 0.05 MG/ML IJ SOLN
INTRAMUSCULAR | Status: AC
  Filled 2013-12-02: qty 2

## 2013-12-02 MED ORDER — MILRINONE IN DEXTROSE 20 MG/100ML IV SOLN
0.2500 ug/kg/min | INTRAVENOUS | Status: DC
  Administered 2013-12-02: 16:00:00 0.25 ug/kg/min via INTRAVENOUS
  Filled 2013-12-02: qty 100

## 2013-12-02 MED ORDER — ALLOPURINOL 100 MG PO TABS
100.0000 mg | ORAL_TABLET | Freq: Every day | ORAL | Status: DC
  Administered 2013-12-02: 22:00:00 100 mg via ORAL
  Filled 2013-12-02 (×2): qty 1

## 2013-12-02 MED ORDER — ONDANSETRON HCL 4 MG/2ML IJ SOLN: 4.0000 mg | Freq: Four times a day (QID) | INTRAMUSCULAR | Status: DC | PRN

## 2013-12-02 MED ORDER — MIDAZOLAM HCL 5 MG/5ML IJ SOLN
INTRAMUSCULAR | Status: AC
  Filled 2013-12-02: qty 5

## 2013-12-02 MED ORDER — CEFAZOLIN SODIUM 1-5 GM-% IV SOLN
1.0000 g | Freq: Four times a day (QID) | INTRAVENOUS | Status: AC
  Administered 2013-12-02 – 2013-12-03 (×3): 1 g via INTRAVENOUS
  Filled 2013-12-02 (×4): qty 50

## 2013-12-02 MED ORDER — ALLOPURINOL 100 MG PO TABS
200.0000 mg | ORAL_TABLET | Freq: Every day | ORAL | Status: DC
  Administered 2013-12-03: 11:00:00 200 mg via ORAL
  Filled 2013-12-02: qty 2

## 2013-12-02 MED ORDER — AMIODARONE HCL 200 MG PO TABS
200.0000 mg | ORAL_TABLET | Freq: Two times a day (BID) | ORAL | Status: DC
  Administered 2013-12-02 – 2013-12-03 (×2): 200 mg via ORAL
  Filled 2013-12-02 (×3): qty 1

## 2013-12-02 MED ORDER — FLUTICASONE PROPIONATE 50 MCG/ACT NA SUSP
1.0000 | Freq: Every day | NASAL | Status: DC
  Administered 2013-12-02: 17:00:00 1 via NASAL
  Filled 2013-12-02: qty 16

## 2013-12-02 MED ORDER — ATORVASTATIN CALCIUM 80 MG PO TABS
80.0000 mg | ORAL_TABLET | Freq: Every day | ORAL | Status: DC
  Administered 2013-12-02 – 2013-12-03 (×2): 80 mg via ORAL
  Filled 2013-12-02 (×2): qty 1

## 2013-12-02 MED ORDER — DIGOXIN 0.0625 MG HALF TABLET
0.0625 mg | ORAL_TABLET | ORAL | Status: DC
  Administered 2013-12-03: 10:00:00 0.0625 mg via ORAL
  Filled 2013-12-02 (×2): qty 1

## 2013-12-02 MED ORDER — HEPARIN (PORCINE) IN NACL 2-0.9 UNIT/ML-% IJ SOLN
INTRAMUSCULAR | Status: AC
  Filled 2013-12-02: qty 500

## 2013-12-02 MED ORDER — ASPIRIN EC 81 MG PO TBEC
81.0000 mg | DELAYED_RELEASE_TABLET | Freq: Every day | ORAL | Status: DC
  Administered 2013-12-02 – 2013-12-03 (×2): 81 mg via ORAL
  Filled 2013-12-02 (×2): qty 1

## 2013-12-02 MED ORDER — MORPHINE SULFATE 4 MG/ML IJ SOLN: 4.0000 mg | INTRAMUSCULAR | Status: DC | PRN

## 2013-12-02 MED ORDER — MILRINONE IN DEXTROSE 20 MG/100ML IV SOLN
0.2500 ug/kg/min | INTRAVENOUS | Status: DC
  Administered 2013-12-02: 18:00:00 0.25 ug/kg/min via INTRAVENOUS

## 2013-12-02 MED ORDER — INSULIN GLARGINE 100 UNIT/ML ~~LOC~~ SOLN
30.0000 [IU] | Freq: Every day | SUBCUTANEOUS | Status: DC
  Administered 2013-12-02: 22:00:00 30 [IU] via SUBCUTANEOUS
  Filled 2013-12-02 (×2): qty 0.3

## 2013-12-02 MED ORDER — FUROSEMIDE 80 MG PO TABS
80.0000 mg | ORAL_TABLET | Freq: Every day | ORAL | Status: DC
  Administered 2013-12-02 – 2013-12-03 (×2): 80 mg via ORAL
  Filled 2013-12-02 (×2): qty 1

## 2013-12-02 MED ORDER — LIDOCAINE HCL (PF) 1 % IJ SOLN
INTRAMUSCULAR | Status: AC
Start: 2013-12-02 — End: 2013-12-02
  Filled 2013-12-02: qty 60

## 2013-12-02 MED ORDER — DIPHENHYDRAMINE HCL 50 MG PO CAPS
50.0000 mg | ORAL_CAPSULE | Freq: Every day | ORAL | Status: DC
  Administered 2013-12-02: 50 mg via ORAL
  Filled 2013-12-02 (×2): qty 1

## 2013-12-02 MED ORDER — MORPHINE SULFATE 2 MG/ML IJ SOLN
2.0000 mg | INTRAMUSCULAR | Status: DC | PRN
  Administered 2013-12-02 – 2013-12-03 (×4): 2 mg via INTRAVENOUS
  Filled 2013-12-02 (×4): qty 1

## 2013-12-02 MED ORDER — ACETAMINOPHEN 325 MG PO TABS
325.0000 mg | ORAL_TABLET | ORAL | Status: DC | PRN
  Administered 2013-12-02 – 2013-12-03 (×3): 650 mg via ORAL
  Filled 2013-12-02 (×3): qty 2

## 2013-12-02 MED ORDER — POTASSIUM CHLORIDE CRYS ER 20 MEQ PO TBCR
20.0000 meq | EXTENDED_RELEASE_TABLET | Freq: Every day | ORAL | Status: DC
  Administered 2013-12-02 – 2013-12-03 (×2): 20 meq via ORAL
  Filled 2013-12-02 (×2): qty 1

## 2013-12-02 MED ORDER — SODIUM CHLORIDE 0.9 % IV SOLN: INTRAVENOUS | Status: AC

## 2013-12-02 NOTE — Progress Notes (Signed)
Retract PICC 6.5cm per Dr Gerilyn Nestle, Radiologist as ordered by Dr Jeffie Pollock to place picc tip in Proximal SVC.  Brandon Melnick, RN

## 2013-12-02 NOTE — Progress Notes (Signed)
Spoke with Dr Gerilyn Nestle in radiology who after examining CXR taken earlier today advised to retract picc line 6.5cm to place picc in proximal svc per Dr Bensimohn's order.     Brandon Melnick, RN IV Team

## 2013-12-02 NOTE — H&P (View-Only) (Signed)
ELECTROPHYSIOLOGY CONSULT NOTE    Patient ID: [name] MRN: [id number], DOB/AGE: 56-05-1955 56 y.o.  Admit date: 11/25/2013 Date of Consult: 10-03-13  Primary Physician: Arman Filter, MD Primary Cardiologist: [facility] Electrophyioslogist: Caryl Comes  Reason for Consultation: CRT upgrade    HPI:  [name] is a 58 y.o. female with a past medical history significant for CAD (s/p CABG), ICM, diabetes, hypertension, systolic heart failure, mitral valve disease (s/p repair at time of CABG), and prior polysubstance abuse (none since 2013).     CRT upgrade had been considered, but with IVCD and QRS 125 or so , i thought not liekly   She had syncope in August associated with polymorphic ventricular tachycardia. She was admitted for catheterization that was complicated by a vagal episode and hypotension. Severe three-vessel native disease present with all bypass grafts patent. She was treated with amiodarone for ventricular tachycardia.   She was admitted the other day feeling "worse than she has ever felt".  She was admitted for right heart catheterization that demonstrated some discordant data. Her markedly elevated filling pressures, RA equals 11, PCW 23. Cardiac index somewhere between 1.9--2.7. Treated with milrinone with some improvement  RA pressure allows--6, wedge, 23--15, Co-OX 80%     We are asked to see her for CRT upgrade as her ECG has continued to evolve now with for left bundle branchlike pattern and a QRS duration of 146 ms     Last echo 06-2013 (TEE) demonstrated EF 15%, severe MR.     EP has been asked to evaluate for treatment options.    ROS is negative except as outlined above.   Past Medical History  Diagnosis Date  . Coronary artery disease     a. s/p CABG x 5 with MV annuloplasty 2007   . Diabetes mellitus   . Hypertension   . Chronic systolic heart failure     a. ICM b. ECHO (06/2013): EF 15%, severe MR (06/2013) c. RHC (10/2013): RA 5, RV  23/2/5, PA 25/6 (14), PCWP 12, Fick CO/CI: 3.2/1.7, PVR 0.7 WU, PA 45%, 47% and 55% (with levophed 5), vagal response during cath   . Polysubstance abuse     history of  (cocaine, tobacco and ETOH)  . V-tach   . Ischemic cardiomyopathy     a. s/p ICD b. LHC (10/04/13): 1. Severe native CAD with all bypass grafts patent       Surgical History:  Past Surgical History  Procedure Laterality Date  . Coronary artery bypass graft    . Cardiac catheterization    . Cardiac defibrillator placement      2011, Followed By Dr. Caryl Comes  . Tee without cardioversion N/A 06/02/2013    Procedure: TRANSESOPHAGEAL ECHOCARDIOGRAM (TEE);  Surgeon: Larey Dresser, MD;  Location: [facility];  Service: Cardiovascular;  Laterality: N/A;     Prescriptions prior to admission  Medication Sig Dispense Refill  . allopurinol (ZYLOPRIM) 100 MG tablet Take 100-200 mg by mouth 2 (two) times daily. Takes 200 mg in the morning and 100 mg in the evening      . amiodarone (PACERONE) 200 MG tablet Take 200 mg by mouth 2 (two) times daily.      [name] aspirin EC 81 MG tablet Take 81 mg by mouth daily.      [name] atorvastatin (LIPITOR) 80 MG tablet Take 1 tablet (80 mg total) by mouth daily.  30 tablet  11  . digoxin (LANOXIN) 0.125 MG tablet Take 0.0625 mg  by mouth every other day.      . diphenhydrAMINE (BENADRYL) 25 mg capsule Take 50 mg by mouth at bedtime.       . fluticasone (FLONASE) 50 MCG/ACT nasal spray Place 1 spray into both nostrils daily.  16 g  2  . furosemide (LASIX) 40 MG tablet Take 80 mg by mouth daily. 80 mg (2 tablets) in the am. If weight increases more than 3 lbs day or 5 lbs week take extra 40 mg (1 tablet)      . insulin glargine (LANTUS) 100 UNIT/ML injection Inject 0.3 mLs (30 Units total) into the skin at bedtime.  10 mL  11  . insulin lispro (HUMALOG) 100 UNIT/ML injection Inject 6 Units into the skin 3 (three) times daily as needed for high blood sugar.      . lisinopril (PRINIVIL,ZESTRIL) 5 MG tablet Take  2.5 mg by mouth at bedtime.      . potassium chloride SA (K-DUR,KLOR-CON) 20 MEQ tablet Take 20 mEq by mouth daily.      . nitroGLYCERIN (NITROSTAT) 0.4 MG SL tablet Place 1 tablet (0.4 mg total) under the tongue every 5 (five) minutes as needed. For chest pain.  25 tablet  11    Inpatient Medications:  . allopurinol  100 mg Oral QHS  . allopurinol  200 mg Oral Daily  . amiodarone  200 mg Oral BID  . aspirin EC  81 mg Oral Daily  . atorvastatin  80 mg Oral Daily  . digoxin  0.0625 mg Oral QODAY  . enoxaparin (LOVENOX) injection  40 mg Subcutaneous Q24H  . fluticasone  1 spray Each Nare Daily  . [START ON 11/28/2013] furosemide  80 mg Oral Daily  . insulin aspart  0-20 Units Subcutaneous TID WC  . potassium chloride SA  20 mEq Oral Daily  . sodium chloride  3 mL Intravenous Q12H    Allergies: No Known Allergies  History   Social History  . Marital Status: Widowed    Spouse Name: N/A    Number of Children: N/A  . Years of Education: N/A   Occupational History  . disable    Social History Main Topics  . Smoking status: Former Smoker    Quit date: 03/03/2011  . Smokeless tobacco: Not on file  . Alcohol Use: Yes     Comment: Had some drinks New Years, describes social drinkingC  . Drug Use: No     Comment: Has history of cocaine use, denies recent  . Sexual Activity: Not Currently   Other Topics Concern  . Not on file   Social History Narrative  . No narrative on file     Family History  Problem Relation Age of Onset  . Heart attack Mother     Died age 52  . Diabetes Maternal Grandmother   . Heart attack Cousin   . Heart attack Maternal Uncle      BP 113/49  Pulse 58  Temp(Src) 97.7 F (36.5 C) (Core (Comment))  Resp 13  Ht 5\' 4"  (1.626 m)  Wt 183 lb 3.2 oz (83.1 kg)  BMI 31.43 kg/m2  SpO2 98%  Physical Exam: Alert and oriented in no acute distress HENT- normal Eyes- EOMI, without scleral icterus Skin- warm and dry; without rashes LN-neg Neck-  supple without thyromegaly, JVP-flat, carotids brisk and full without bruits Back-without CVAT or kyphosis Lungs-clear to auscultation CV-Regular rate and rhythm, nl S1 and S2, 3/6 murmur  ]Abd-soft with active bowel sounds; no  midline pulsation or hepatomegaly Pulses-intact femoral and distal MKS-without gross deformity Neuro- Ax O, CN3-12 intact, grossly normal motor and sensory function Affect engaging   Labs:    Lab Results  Component Value Date   WBC 4.6 11/27/2013   HGB 10.6* 11/27/2013   HCT 33.1* 11/27/2013   MCV 90.4 11/27/2013   PLT 120* 11/27/2013     Recent Labs Lab 11/27/13 0454  NA 139  K 4.3  CL 98  CO2 26  BUN 33*  CREATININE 1.48*  CALCIUM 9.1  GLUCOSE 143*    Radiology/Studies: Dg Hip Complete Left 09/10/2013   CLINICAL DATA:  Left hip pain  EXAM: LEFT HIP - COMPLETE 2+ VIEW  COMPARISON:  None.  FINDINGS: Mild degenerative changes of the hip joints are noted bilaterally. The pelvic ring is intact. No acute fracture or dislocation is seen. No soft tissue abnormality is noted.  IMPRESSION: Degenerative change without acute abnormality.   Electronically Signed   By: Inez Catalina M.D.   On: 09/10/2013 09:08   Ct Head Wo Contrast 10/02/2013   CLINICAL DATA:  Headache.  Fell and hit head.  EXAM: CT HEAD WITHOUT CONTRAST  CT MAXILLOFACIAL WITHOUT CONTRAST  TECHNIQUE: Multidetector CT imaging of the head and maxillofacial structures were performed using the standard protocol without intravenous contrast. Multiplanar CT image reconstructions of the maxillofacial structures were also generated.  COMPARISON:  None.  FINDINGS: CT HEAD FINDINGS  Mild chronic microvascular ischemia. Ventricle size is normal. Negative for acute infarct. Negative for hemorrhage or mass. Negative for skull fracture. Atherosclerotic disease.  CT MAXILLOFACIAL FINDINGS  Possible fracture of the right orbit which is mildly depressed with mild soft tissue swelling. This could be due to acute fracture. No  fracture of the nasal bone or mandible.  Mild mucosal edema in the paranasal sinuses without air-fluid level.  IMPRESSION: No acute intracranial abnormality.  Possible mildly depressed fracture right orbital floor.   Electronically Signed   By: Franchot Gallo M.D.   On: 10/02/2013 22:41    JSE:GBTDV  QRS duration 146 with monophasic R waves lead 1, L.     DEVICE HISTORY:  Interrogation pending   Assessment/Plan:  VT  Polymorphic  ICD Medtronic  Ischemic Cardiomyopathy//Severe MR Prior CABG  Congestive heart failure-acute on  chronic-systolic-class IIIB  There has been interval worsening of QRS duration was a pattern more consistent with left bundle branch block. Interestingly, her deterioration is coincidental with the initiation of amiodarone for polymorphic ventricular tachycardia, however, having said that, her right heart cath was markedly abnormal with very elevated filling pressures which are significantly improved on milrinone.  We will repeat the ECG today to assess QRS duration.  She has a transplant evaluation at [facility] on Tuesday. We could potentially do her device upgrade the ECG remains supportive of that intervention later this week. We'll discuss this with Dr. Reine Just in the morning

## 2013-12-02 NOTE — Interval H&P Note (Signed)
ICD Criteria  Current LVEF:15% ;Obtained > 3 months ago and < or = 6 months ago.   NYHA Functional Classification: Class III  Heart Failure History:  Yes, Duration of heart failure since onset is > 9 months  Non-Ischemic Dilated Cardiomyopathy History:  No.  Atrial Fibrillation/Atrial Flutter:  No.  Ventricular Tachycardia History:  Yes, Hemodynamic status unknown. VT Type is unknown.  Cardiac Arrest History:  No  History of Syndromes with Risk of Sudden Death:  No.  Previous ICD:  Yes, ICD Type:  Single, Reason for ICD:  Primary prevention.  LVEF is not available  Electrophysiology Study: No.  Prior MI: Yes, Most recent MI timeframe is > 40 days.  PPM: No.  OSA:  No  Patient Life Expectancy of >=1 year: Yes.  Anticoagulation Therapy:  Patient is NOT on anticoagulation therapy.   Beta Blocker Therapy:  No, Reason not on Beta Blocker therapy: bradycardia  Ace Inhibitor/ARB Therapy:  Yes.History and Physical Interval Note:  12/02/2013 11:04 AM  [name]  has presented today for surgery, with the diagnosis of chf  The various methods of treatment have been discussed with the patient and family. After consideration of risks, benefits and other options for treatment, the patient has consented to  Procedure(s): BI-VENTRICULAR IMPLANTABLE CARDIOVERTER DEFIBRILLATOR UPGRADE (N/A) as a surgical intervention .  The patient's history has been reviewed, patient examined, no change in status, stable for surgery.  I have reviewed the patient's chart and labs.  Questions were answered to the patient's satisfaction.     [name]

## 2013-12-02 NOTE — CV Procedure (Signed)
[name] [id number]  [id number]  Preop Dx: icm LBBB CHF sinus brady Postop Dx same/ R/S lead inuslation breach  Procedure: device explant CRT implant, lead repair pocket revision, LV lead placemnet Atrail lead placement  Cx: None  EBL: Minimal    Dictation number [id number]  Virl Axe, MD 12/02/2013 3:19 PM

## 2013-12-03 DIAGNOSIS — I447 Left bundle-branch block, unspecified: Secondary | ICD-10-CM | POA: Diagnosis not present

## 2013-12-03 DIAGNOSIS — I10 Essential (primary) hypertension: Secondary | ICD-10-CM | POA: Diagnosis not present

## 2013-12-03 DIAGNOSIS — Z951 Presence of aortocoronary bypass graft: Secondary | ICD-10-CM | POA: Diagnosis not present

## 2013-12-03 DIAGNOSIS — I255 Ischemic cardiomyopathy: Secondary | ICD-10-CM | POA: Diagnosis not present

## 2013-12-03 DIAGNOSIS — E119 Type 2 diabetes mellitus without complications: Secondary | ICD-10-CM | POA: Diagnosis not present

## 2013-12-03 DIAGNOSIS — I5022 Chronic systolic (congestive) heart failure: Secondary | ICD-10-CM | POA: Diagnosis not present

## 2013-12-03 LAB — GLUCOSE, CAPILLARY: Glucose-Capillary: 122 mg/dL — ABNORMAL HIGH (ref 70–99)

## 2013-12-03 MED ORDER — HYDROCODONE-ACETAMINOPHEN 5-325 MG PO TABS: 1.0000 | ORAL_TABLET | Freq: Four times a day (QID) | ORAL | Status: DC | PRN

## 2013-12-03 NOTE — Op Note (Signed)
NAMEMARIAPAULA, Robin NO.:  [id number]  MEDICAL RECORD NO.:  [id number]  LOCATION:  6C05C                        FACILITY:  [facility]  PHYSICIAN:  Deboraha Sprang, MD, FACCDATE OF BIRTH:  1955-04-14  DATE OF PROCEDURE:  12/02/2013 DATE OF DISCHARGE:                              OPERATIVE REPORT   PREOPERATIVE DIAGNOSES:  Ischemic cardiomyopathy, left bundle-branch block, congestive heart failure, and previously implanted ICD.  POSTOPERATIVE DIAGNOSES:  Ischemic cardiomyopathy, left bundle-branch block, congestive heart failure, and previously implanted ICD.  PROCEDURE:  Implantation of RA lead; implantation of LV lead; pocket revision; lead repair; insertion of a new defibrillator and removal of an old defibrillator.  DESCRIPTION OF PROCEDURE:  Following obtaining informed consent, the patient was brought to the electrophysiology laboratory and placed on the fluoroscopic table in supine position.  She was on IV milrinone through a PICC line.  After routine prep and drape of the left upper chest, lidocaine was infiltrated just cephalad to the previous incision and carried down to the layer of the prepectoral fascia using electrocautery and sharp dissection.  At this point, attention was turned to gain access to the extrathoracic left subclavian vein which was accomplished without difficulty without the aspiration or puncture of the artery.  Two separate venipunctures were accomplished. Guidewires were placed and retained.  We then went on a long arduous difficult track to get coronary sinus access.  We ended up using a multitude of sheaths.  Finally, we were able to get one Wholey wire across, a second Wholey wire I was not able to place, so after multiple other ideas, I took a Mailman 0.14 wire and placed it across and that allowed me to advance the sheath just another centimeter or so.  This further allowed a second Wholey wire and ultimately a third Wholey  wire to be placed into the coronary sinus that allowed for [place] to place the sheath just about as far as we could get it to go.  We had no ability to take an occlusive venogram.  Nonocclusive venograms were unenlightening. We then took a 0.14 whisper wire and went exploring and found a high lateral branch.  We were able to pass the wire earlier back to the base of the coronary sinus by way of the apex.  However, when we went to pass a Medtronic 4396 Bipolar Straight lead, serial #[id number], was unable to pass past the junction of the vein and the coronary sinus.  We then went exploring higher and found a high lateral/anterolateral branch.  We then deployed the lead as far as I could advance it.  There was an anteriorly coursing branch and a more lateral coursing branch, and we were able to deploy the tip of the lead in the latter.  In this location initially, the LV amplitude was 9 with pacing impedance of 1100 ohms and a threshold of less than 2 V at 0.5 milliseconds.  We then interrupted our normal routine and removed the 9.5-French sheath and the LV deployment system and secured the lead.  The difficulties in advancing into this coronary sinus was that there was a pair of 90-degree bends on the horizontal plane which were very  difficult to traverse.  Having secured this lead, we then set about to deploy the atrial lead, this was a Medtronic 5076, 45 cm lead, serial #[id number].  We looked at more than 10-15 sites before we finally settled on a place where the bipolar P- wave was 0.8 mV with a pacing impedance of 662 and a threshold of 1.2 V at 0.5 milliseconds.  The current of injury was modest at best.  There was no diaphragmatic pacing at 10 V.  This lead was secured.  At this juncture which is now many hours into the procedure, we then opened up the defibrillator pocket, extracted the lead from the floor of the pocket, interrogated the previously implanted 6947 lead with an amplitude  of 12.5 with impedance of 583 and threshold of 0.8 at 0.5.  It was noteworthy that there was discoloration of the right sense portion and corrugation of this lead segment.  We repaired the lead and then attached it to a Medtronic Viva XT CRT ICD, serial #[id number]. Following insertion of the device, we noted parameters which I do not have here, but the only LV pacing factor that worked at 5 V at 0.4 milliseconds is LV tip to RV coil.  This is unfortunate but was the best we could do.  The pocket was copiously irrigated with antibiotic containing saline solution.  The pocket was extended caudally to allow for housing the large device.  Hemostasis was obtained.  Surgicel was placed following irrigation of the pocket on the posterior aspect and the cephalad aspect of the pocket.  The wound was then closed in 2 layers using a Monocryl closure layer as the patient had an antecedent keloid.  The patient tolerated the procedure surprisingly well.     Deboraha Sprang, MD, [facility]     SCK/MEDQ  D:  12/02/2013  T:  12/02/2013  Job:  [id number]

## 2013-12-03 NOTE — Discharge Summary (Signed)
ELECTROPHYSIOLOGY PROCEDURE DISCHARGE SUMMARY    Patient ID: [name],  MRN: [id number], DOB/AGE: 10/14/1955 58 y.o.  Admit date: 12/02/2013 Discharge date: 12/03/2013  Primary Care Physician: Arman Filter, MD Primary Cardiologist: [place] Electrophysiologist: Caryl Comes  Primary Discharge Diagnosis:  ICM, congestive heart failure, LBBB status post CRTD upgrade this admission  Secondary Discharge Diagnosis:  1.  CAD - prior CABG 2.  Diabetes 3.  Hypertension 4.  History of polysubstance abuse 5.  VT  No Known Allergies   Procedures This Admission:  1.  Upgrade of previously implanted single chamber ICD to CRT D on 12-02-2013 by Dr Caryl Comes.  The patient received a MDT model number Viva XT CRTD with model number 5076 right atrial lead and 4396 left ventricular lead.  The previously implanted RV lead was used.  DFT's were deferred at time of implant.  There were no immediate post procedure complications. 2.  CXR on 12-03-13 demonstrated no pneumothorax status post device implantation.   Brief HPI: [name] is a 58 y.o. female with a past medical history as outlined above.  She has had progressive heart failure symptoms and was referred for CRTD upgrade.  Risks, benefits, and alternatives to ICD implantation were reviewed with the patient who wished to proceed.   Hospital Course:  The patient was admitted and underwent implantation of a MDT CRTD with details as outlined above.   She was monitored on telemetry overnight which demonstrated sinus rhythm with ventricular pacing.  Left chest was without hematoma or ecchymosis.  The device was interrogated and found to be functioning normally.  CXR was obtained and demonstrated no pneumothorax status post device implantation.  Wound care, arm mobility, and restrictions were reviewed with the patient.  Dr Lovena Le examined the patient and considered them stable for discharge to home.   She had moderate incisional pain and will  be discharged with Vicodin 5/500mg  #10.   She has early follow up scheduled with the heart failure clinic.   The patient's discharge medications include an ACE-I (Lisninopril).  She is not currently on beta blocker due to hypotension.   Discharge Vitals: Blood pressure 120/48, pulse 60, temperature 98 F (36.7 C), temperature source Oral, resp. rate 18, height 5\' 4"  (1.626 m), weight 182 lb 8.7 oz (82.8 kg), SpO2 93.00%.   Labs:   Lab Results  Component Value Date   WBC 4.6 11/27/2013   HGB 10.6* 11/27/2013   HCT 33.1* 11/27/2013   MCV 90.4 11/27/2013   PLT 120* 11/27/2013    Recent Labs Lab 11/28/13 0500  NA 136*  K 4.2  CL 99  CO2 26  BUN 28*  CREATININE 1.35*  CALCIUM 9.1  GLUCOSE 126*     Discharge Medications:    Medication List    ASK your doctor about these medications       acetaminophen 325 MG tablet  Commonly known as:  TYLENOL  Take 325 mg by mouth every 6 (six) hours as needed for moderate pain.     allopurinol 100 MG tablet  Commonly known as:  ZYLOPRIM  Take 100-200 mg by mouth 2 (two) times daily. Takes 200 mg in the morning and 100 mg in the evening     amiodarone 200 MG tablet  Commonly known as:  PACERONE  Take 200 mg by mouth 2 (two) times daily.     aspirin EC 81 MG tablet  Take 81 mg by mouth daily.     atorvastatin 80 MG tablet  Commonly known as:  LIPITOR  Take 1 tablet (80 mg total) by mouth daily.     digoxin 0.125 MG tablet  Commonly known as:  LANOXIN  Take 0.0625 mg by mouth every other day.     diphenhydrAMINE 25 mg capsule  Commonly known as:  BENADRYL  Take 50 mg by mouth at bedtime.     fluticasone 50 MCG/ACT nasal spray  Commonly known as:  FLONASE  Place 1 spray into both nostrils daily.     furosemide 40 MG tablet  Commonly known as:  LASIX  Take 40-80 mg by mouth daily. 80 mg (2 tablets) in the am. If weight increases more than 3 lbs day or 5 lbs week take extra 40 mg (1 tablet)     insulin glargine 100 UNIT/ML  injection  Commonly known as:  LANTUS  Inject 0.3 mLs (30 Units total) into the skin at bedtime.     insulin lispro 100 UNIT/ML injection  Commonly known as:  HUMALOG  Inject 6 Units into the skin 3 (three) times daily as needed for high blood sugar (over 150).     lisinopril 2.5 MG tablet  Commonly known as:  PRINIVIL,ZESTRIL  Take 2.5 mg by mouth at bedtime.     milrinone 20 MG/100ML Soln infusion  Commonly known as:  PRIMACOR  Inject 20.425 mcg/min into the vein continuous.     nitroGLYCERIN 0.4 MG SL tablet  Commonly known as:  NITROSTAT  Place 1 tablet (0.4 mg total) under the tongue every 5 (five) minutes as needed. For chest pain.     potassium chloride SA 20 MEQ tablet  Commonly known as:  K-DUR,KLOR-CON  Take 20 mEq by mouth daily.        Disposition:     Duration of Discharge Encounter: less than 30 minutes including physician time.  Signed, Mikle Bosworth.D.

## 2013-12-03 NOTE — Discharge Instructions (Signed)
° ° °  Supplemental Discharge Instructions for  Pacemaker/Defibrillator Patients  Activity No heavy lifting or vigorous activity with your left/right arm for 6 to 8 weeks.  Do not raise your left/right arm above your head for one week.  Gradually raise your affected arm as drawn below.           __  NO DRIVING for  One week ; you may begin driving on   Dec 10, 2242  .  WOUND CARE   Keep the wound area clean and dry.  Do not get this area wet for one week. No showers for one week; you may shower on  Dec 09, 2013   .   The tape/steri-strips on your wound will fall off; do not pull them off.  No bandage is needed on the site.  DO  NOT apply any creams, oils, or ointments to the wound area.   If you notice any drainage or discharge from the wound, any swelling or bruising at the site, or you develop a fever > 101? F after you are discharged home, call the office at once.  Special Instructions   You are still able to use cellular telephones; use the ear opposite the side where you have your pacemaker/defibrillator.  Avoid carrying your cellular phone near your device.   When traveling through airports, show security personnel your identification card to avoid being screened in the metal detectors.  Ask the security personnel to use the hand wand.   Avoid arc welding equipment, MRI testing (magnetic resonance imaging), TENS units (transcutaneous nerve stimulators).  Call the office for questions about other devices.   Avoid electrical appliances that are in poor condition or are not properly grounded.   Microwave ovens are safe to be near or to operate.  Additional information for defibrillator patients should your device go off:   If your device goes off ONCE and you feel fine afterward, notify the device clinic nurses.   If your device goes off ONCE and you do not feel well afterward, call 911.   If your device goes off TWICE, call 911.   If your device goes off THREE times in one day, call  911.  DO NOT DRIVE YOURSELF OR A FAMILY MEMBER WITH A DEFIBRILLATOR TO THE HOSPITAL--CALL 911.

## 2013-12-03 NOTE — Progress Notes (Signed)
Incisional pain exacerbated by large breasts.  Discussed mitigation strategies with patient for comfort at home.

## 2013-12-05 DIAGNOSIS — E119 Type 2 diabetes mellitus without complications: Secondary | ICD-10-CM | POA: Diagnosis not present

## 2013-12-05 DIAGNOSIS — I129 Hypertensive chronic kidney disease with stage 1 through stage 4 chronic kidney disease, or unspecified chronic kidney disease: Secondary | ICD-10-CM | POA: Diagnosis not present

## 2013-12-05 DIAGNOSIS — I349 Nonrheumatic mitral valve disorder, unspecified: Secondary | ICD-10-CM | POA: Diagnosis not present

## 2013-12-05 DIAGNOSIS — I255 Ischemic cardiomyopathy: Secondary | ICD-10-CM | POA: Diagnosis not present

## 2013-12-05 DIAGNOSIS — Z452 Encounter for adjustment and management of vascular access device: Secondary | ICD-10-CM | POA: Diagnosis not present

## 2013-12-05 DIAGNOSIS — I509 Heart failure, unspecified: Secondary | ICD-10-CM | POA: Diagnosis not present

## 2013-12-05 DIAGNOSIS — I5023 Acute on chronic systolic (congestive) heart failure: Secondary | ICD-10-CM | POA: Diagnosis not present

## 2013-12-05 LAB — PROTIME-INR: INR: 1.1 (ref 0.9–1.1)

## 2013-12-09 ENCOUNTER — Telehealth (HOSPITAL_COMMUNITY): Payer: Self-pay | Admitting: Cardiology

## 2013-12-09 DIAGNOSIS — Z452 Encounter for adjustment and management of vascular access device: Secondary | ICD-10-CM | POA: Diagnosis not present

## 2013-12-09 DIAGNOSIS — I255 Ischemic cardiomyopathy: Secondary | ICD-10-CM | POA: Diagnosis not present

## 2013-12-09 DIAGNOSIS — I129 Hypertensive chronic kidney disease with stage 1 through stage 4 chronic kidney disease, or unspecified chronic kidney disease: Secondary | ICD-10-CM | POA: Diagnosis not present

## 2013-12-09 DIAGNOSIS — I349 Nonrheumatic mitral valve disorder, unspecified: Secondary | ICD-10-CM | POA: Diagnosis not present

## 2013-12-09 DIAGNOSIS — I5023 Acute on chronic systolic (congestive) heart failure: Secondary | ICD-10-CM | POA: Diagnosis not present

## 2013-12-09 DIAGNOSIS — E119 Type 2 diabetes mellitus without complications: Secondary | ICD-10-CM | POA: Diagnosis not present

## 2013-12-09 NOTE — Telephone Encounter (Signed)
Returned page from triage pager Pt called with concerns regarding pus like drainage from PICC line site Pt states she did call her [facility] nurse, she is currently out of town and advised pt to call our office  Spoke with Imagene Gurney- will have another nurse go out to pts home to access PICC line site  Pt aware and appreciative of assistance

## 2013-12-12 ENCOUNTER — Telehealth (HOSPITAL_COMMUNITY): Payer: Self-pay | Admitting: Vascular Surgery

## 2013-12-12 DIAGNOSIS — I129 Hypertensive chronic kidney disease with stage 1 through stage 4 chronic kidney disease, or unspecified chronic kidney disease: Secondary | ICD-10-CM | POA: Diagnosis not present

## 2013-12-12 DIAGNOSIS — Z452 Encounter for adjustment and management of vascular access device: Secondary | ICD-10-CM | POA: Diagnosis not present

## 2013-12-12 DIAGNOSIS — I349 Nonrheumatic mitral valve disorder, unspecified: Secondary | ICD-10-CM | POA: Diagnosis not present

## 2013-12-12 DIAGNOSIS — I255 Ischemic cardiomyopathy: Secondary | ICD-10-CM | POA: Diagnosis not present

## 2013-12-12 DIAGNOSIS — E119 Type 2 diabetes mellitus without complications: Secondary | ICD-10-CM | POA: Diagnosis not present

## 2013-12-12 DIAGNOSIS — I5023 Acute on chronic systolic (congestive) heart failure: Secondary | ICD-10-CM | POA: Diagnosis not present

## 2013-12-12 NOTE — Telephone Encounter (Signed)
Picc line not giving blood return.. She will be in on Wed Angie wants to know if she could get cath flow and labs on wed.. Please advise

## 2013-12-13 NOTE — Telephone Encounter (Signed)
Have discussed w/Pam Tamera Punt, RN with [facility] and with pt's RN, will assess at [place] 10/14 and will get labs at that time

## 2013-12-14 ENCOUNTER — Ambulatory Visit (HOSPITAL_COMMUNITY)
Admission: RE | Admit: 2013-12-14 | Discharge: 2013-12-14 | Disposition: A | Discharge status: Home / Self Care | Payer: Medicare Other | Source: Ambulatory Visit | Attending: Internal Medicine | Admitting: Internal Medicine

## 2013-12-14 ENCOUNTER — Ambulatory Visit (INDEPENDENT_AMBULATORY_CARE_PROVIDER_SITE_OTHER): Payer: Medicare Other | Admitting: *Deleted

## 2013-12-14 VITALS — BP 122/70 | HR 67 | Wt 184.5 lb

## 2013-12-14 DIAGNOSIS — I340 Nonrheumatic mitral (valve) insufficiency: Secondary | ICD-10-CM | POA: Diagnosis not present

## 2013-12-14 DIAGNOSIS — Z8249 Family history of ischemic heart disease and other diseases of the circulatory system: Secondary | ICD-10-CM | POA: Insufficient documentation

## 2013-12-14 DIAGNOSIS — I129 Hypertensive chronic kidney disease with stage 1 through stage 4 chronic kidney disease, or unspecified chronic kidney disease: Secondary | ICD-10-CM | POA: Insufficient documentation

## 2013-12-14 DIAGNOSIS — Z951 Presence of aortocoronary bypass graft: Secondary | ICD-10-CM | POA: Insufficient documentation

## 2013-12-14 DIAGNOSIS — I447 Left bundle-branch block, unspecified: Secondary | ICD-10-CM | POA: Diagnosis not present

## 2013-12-14 DIAGNOSIS — I472 Ventricular tachycardia: Secondary | ICD-10-CM | POA: Diagnosis not present

## 2013-12-14 DIAGNOSIS — I119 Hypertensive heart disease without heart failure: Secondary | ICD-10-CM | POA: Insufficient documentation

## 2013-12-14 DIAGNOSIS — F1921 Other psychoactive substance dependence, in remission: Secondary | ICD-10-CM | POA: Diagnosis not present

## 2013-12-14 DIAGNOSIS — I5022 Chronic systolic (congestive) heart failure: Secondary | ICD-10-CM

## 2013-12-14 DIAGNOSIS — Z79899 Other long term (current) drug therapy: Secondary | ICD-10-CM | POA: Insufficient documentation

## 2013-12-14 DIAGNOSIS — F1021 Alcohol dependence, in remission: Secondary | ICD-10-CM | POA: Insufficient documentation

## 2013-12-14 DIAGNOSIS — N183 Chronic kidney disease, stage 3 (moderate): Secondary | ICD-10-CM | POA: Insufficient documentation

## 2013-12-14 DIAGNOSIS — I2510 Atherosclerotic heart disease of native coronary artery without angina pectoris: Secondary | ICD-10-CM | POA: Diagnosis not present

## 2013-12-14 DIAGNOSIS — I255 Ischemic cardiomyopathy: Secondary | ICD-10-CM

## 2013-12-14 DIAGNOSIS — Z95810 Presence of automatic (implantable) cardiac defibrillator: Secondary | ICD-10-CM | POA: Insufficient documentation

## 2013-12-14 DIAGNOSIS — I480 Paroxysmal atrial fibrillation: Secondary | ICD-10-CM | POA: Diagnosis not present

## 2013-12-14 LAB — MDC_IDC_ENUM_SESS_TYPE_INCLINIC
Battery Voltage: 3.04 V
Brady Statistic AP VS Percent: 0.07 %
Brady Statistic RA Percent Paced: 4.8 %
Date Time Interrogation Session: 20151014194856
HighPow Impedance: 228 Ohm
HighPow Impedance: 35 Ohm
HighPow Impedance: 48 Ohm
Lead Channel Impedance Value: 437 Ohm
Lead Channel Impedance Value: 437 Ohm
Lead Channel Impedance Value: 456 Ohm
Lead Channel Pacing Threshold Amplitude: 0.5 V
Lead Channel Pacing Threshold Pulse Width: 0.4 ms
Lead Channel Pacing Threshold Pulse Width: 1 ms
Lead Channel Sensing Intrinsic Amplitude: 0.625 mV
Lead Channel Sensing Intrinsic Amplitude: 0.75 mV
Lead Channel Sensing Intrinsic Amplitude: 14.25 mV
Lead Channel Sensing Intrinsic Amplitude: 14.75 mV
Lead Channel Setting Pacing Amplitude: 5 V
Lead Channel Setting Pacing Pulse Width: 0.4 ms
MDC IDC MSMT BATTERY REMAINING LONGEVITY: 40 mo
MDC IDC MSMT LEADCHNL LV IMPEDANCE VALUE: 722 Ohm
MDC IDC MSMT LEADCHNL LV PACING THRESHOLD AMPLITUDE: 3 V
MDC IDC MSMT LEADCHNL RA PACING THRESHOLD PULSEWIDTH: 0.4 ms
MDC IDC MSMT LEADCHNL RV IMPEDANCE VALUE: 570 Ohm
MDC IDC MSMT LEADCHNL RV PACING THRESHOLD AMPLITUDE: 1 V
MDC IDC SET LEADCHNL LV PACING PULSEWIDTH: 1 ms
MDC IDC SET LEADCHNL RA PACING AMPLITUDE: 3.5 V
MDC IDC SET LEADCHNL RV PACING AMPLITUDE: 2 V
MDC IDC SET LEADCHNL RV SENSING SENSITIVITY: 0.45 mV
MDC IDC SET ZONE DETECTION INTERVAL: 290 ms
MDC IDC SET ZONE DETECTION INTERVAL: 360 ms
MDC IDC STAT BRADY AP VP PERCENT: 4.73 %
MDC IDC STAT BRADY AS VP PERCENT: 93.54 %
MDC IDC STAT BRADY AS VS PERCENT: 1.66 %
MDC IDC STAT BRADY RV PERCENT PACED: 97.87 %
Zone Setting Detection Interval: 350 ms
Zone Setting Detection Interval: 450 ms

## 2013-12-14 LAB — CARBOXYHEMOGLOBIN
Carboxyhemoglobin: 1.1 % (ref 0.5–1.5)
Methemoglobin: 0.7 % (ref 0.0–1.5)
O2 SAT: 60.9 %
Total hemoglobin: 10.4 g/dL — ABNORMAL LOW (ref 12.0–16.0)

## 2013-12-14 LAB — BASIC METABOLIC PANEL
Anion gap: 16 — ABNORMAL HIGH (ref 5–15)
BUN: 29 mg/dL — ABNORMAL HIGH (ref 6–23)
CO2: 24 mEq/L (ref 19–32)
Calcium: 9.6 mg/dL (ref 8.4–10.5)
Chloride: 99 mEq/L (ref 96–112)
Creatinine, Ser: 1.49 mg/dL — ABNORMAL HIGH (ref 0.50–1.10)
GFR, EST AFRICAN AMERICAN: 44 mL/min — AB (ref >90)
GFR, EST NON AFRICAN AMERICAN: 38 mL/min — AB (ref >90)
Glucose, Bld: 73 mg/dL (ref 70–99)
POTASSIUM: 4 meq/L (ref 3.7–5.3)
SODIUM: 139 meq/L (ref 137–147)

## 2013-12-14 LAB — CBC
HCT: 33.9 % — ABNORMAL LOW (ref 36.0–46.0)
Hemoglobin: 10.9 g/dL — ABNORMAL LOW (ref 12.0–15.0)
MCH: 28.6 pg (ref 26.0–34.0)
MCHC: 32.2 g/dL (ref 30.0–36.0)
MCV: 89 fL (ref 78.0–100.0)
PLATELETS: 179 10*3/uL (ref 150–400)
RBC: 3.81 MIL/uL — ABNORMAL LOW (ref 3.87–5.11)
RDW: 15.6 % — ABNORMAL HIGH (ref 11.5–15.5)
WBC: 5.1 10*3/uL (ref 4.0–10.5)

## 2013-12-14 LAB — MAGNESIUM: MAGNESIUM: 2.2 mg/dL (ref 1.5–2.5)

## 2013-12-14 NOTE — Patient Instructions (Signed)
Stop Lisinopril  Your physician recommends that you schedule a follow-up appointment in: 1 week

## 2013-12-14 NOTE — Progress Notes (Signed)
Wound check appointment.  Wound without redness or edema. Incision edges approximated, wound well healed. Normal device function. Thresholds, sensing, and impedances consistent with implant measurements. Device programmed at 3.5V for extra safety margin until 3 month visit. Histogram distribution appropriate for patient and level of activity. No mode switches or ventricular arrhythmias noted. Patient educated about wound care, arm mobility, lifting restrictions, shock plan. ROV in 3 months with implanting physician.

## 2013-12-14 NOTE — Progress Notes (Signed)
Patient ID: [name], female   DOB: 1955-09-16, 58 y.o.   MRN: [id number]  PCP: Dr. Nicoletta Dress (Internal Medicine)  HPI: 58 year old female with history of HTN, DM2, past polysubstance abuse (ETOH, tobacco, cocaine), CAD s/p CABG x5 with MV annuloplasty (2007), ICM s/p ICD  (CRT upgrade 62/95), chronic systolic HF EF 28%, severe MR, CKD stage III and PAD. Blood type O+  Presented in 2007 with acute anterior MI with totalled LAD in setting of cocaine use. At time of cath LAD, LCX and RCA occluded. EF 45%. Had abrupt stent occlusion the next day and had to go back to the lab. 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   Admitted 8/2-8/7 for syncope s/p ICD shock. Concern that VT was possibly from ischemia and taken for [facility]. During cath patient had vagal response and BP dropped to 50s and co-ox was in the upper 40s. Placed on Levophed and transferred to floor. Started on Amiodarone.   On 9/25 underwent RHC for low output symptoms. Hemodynamic borderline. Swan left in and she was observed. Cardiac output dropped while in hospital so started on milrinone. Discharged home. Underwent CRT-D upgrade on 12/02/13  Follow-up: [name] she initially felt better with milrinone. Was able to walk more without dyspnea. Hasn't noticed much change with CRT yet. However, milrinone bag was changed on Monday and since that time she has felt weak and dizzy. Fatigued. Also has worsening cough - thinks it is lisinopril but she has been on that for years without problem. + sore throat. No fever or chills. Weight stable.   Echos: 05/19/2012  EF 15% 2/15 EF 15%, diffuse hypokinesis, restrictive diastolic function, s/p MV repair with moderate-severe MR.   CPX: 06/17/12 Peak VO2 14.8 (predicted peak VO2 68.5%), VE/VCO2 slope 43.4 OUES: 1.19, Peak RER 1.12 Vent threshold 11.3 (pred peak VO2 52.3%)  CPX (3/15): peak VO2 15.6 (predicted peak VO2 74.5%) VE/VO2 40.8, RER 1.2   LHC  (10/2013) 1) severe native CAD with all bypass grafts patent  11/01/13 RHC RA = 5  RV = 47/3/10  PA = 54/16 (30)  PCW = 14 v = 20  Fick cardiac output/index = 4.8/2.6  Thermo CO/CI = 3.7/2.0  PVR = 4.3 WU  FA sat = 95%  PA sat = 59%, 59%   Labs:  08/12/12 - on lipitor 40 mg daily Cholesterol 180 Triglyceride 195 HDL 29 LDL 112 K 4.5, creatinine 1.4 11/16/12 K 4.0 Labs 1.5 Pro BNP 181 02/14/13: K+ 4.6, Cr 1.33, Dig level 1.8, TSH 2.97 3/15: K 4.2, creatinine 1.07, HCT 32.4 06/2013: Dig level 0.3, pro-BNP 899, Cholesterol 140, TG 106, HDL 36, LDL 82, Cr 1.5, K+ 4.9 11/11/13: K 4.1 Creatinine 1.8   FH: Mother deceased: CAD, DM2, HTN        Father deceased: stroke  SH: Works odds/end jobs for [employer]; disabled. Lives in [place] with 2 sons.   ROS: All systems negative except as listed in HPI, PMH and Problem List.  Past Medical History  Diagnosis Date  . Coronary artery disease     a. s/p CABG x 5 with MV annuloplasty 2007   . Diabetes mellitus   . Hypertension   . Chronic systolic heart failure     a. ICM b. ECHO (06/2013): EF 15%, severe MR (06/2013) c. RHC (10/2013): RA 5, RV 23/2/5, PA 25/6 (14), PCWP 12, Fick CO/CI: 3.2/1.7, PVR 0.7 WU, PA 45%, 47% and  55% (with levophed 5), vagal response during cath   . Polysubstance abuse     history of  (cocaine, tobacco and ETOH)  . V-tach   . Ischemic cardiomyopathy     a. s/p ICD b. LHC (10/04/13): 1. Severe native CAD with all bypass grafts patent    . Implantable defibrillator   medtronic   . LBBB (left bundle branch block)     Current Outpatient Prescriptions  Medication Sig Dispense Refill  . acetaminophen (TYLENOL) 325 MG tablet Take 325 mg by mouth every 6 (six) hours as needed for moderate pain.      [name] allopurinol (ZYLOPRIM) 100 MG tablet Take 100-200 mg by mouth 2 (two) times daily. Takes 200 mg in the morning and 100 mg in the evening      . amiodarone (PACERONE) 200 MG tablet Take 200 mg by mouth 2 (two) times daily.       [name] aspirin EC 81 MG tablet Take 81 mg by mouth daily.      [name] atorvastatin (LIPITOR) 80 MG tablet Take 1 tablet (80 mg total) by mouth daily.  30 tablet  11  . digoxin (LANOXIN) 0.125 MG tablet Take 0.0625 mg by mouth every other day.      . diphenhydrAMINE (BENADRYL) 25 mg capsule Take 50 mg by mouth at bedtime.       . fluticasone (FLONASE) 50 MCG/ACT nasal spray Place 1 spray into both nostrils daily.  16 g  2  . furosemide (LASIX) 40 MG tablet 80 mg (2 tablets) in the am. If weight increases more than 3 lbs day or 5 lbs week take extra 40 mg (1 tablet)      . HYDROcodone-acetaminophen (NORCO/VICODIN) 5-325 MG per tablet Take 1 tablet by mouth every 6 (six) hours as needed for moderate pain.  10 tablet  0  . insulin glargine (LANTUS) 100 UNIT/ML injection Inject 0.3 mLs (30 Units total) into the skin at bedtime.  10 mL  11  . insulin lispro (HUMALOG) 100 UNIT/ML injection Inject 6 Units into the skin 3 (three) times daily as needed for high blood sugar (over 150).       [name] lisinopril (PRINIVIL,ZESTRIL) 2.5 MG tablet Take 2.5 mg by mouth at bedtime.      . milrinone (PRIMACOR) 20 MG/100ML SOLN infusion Inject 20.425 mcg/min into the vein continuous.  100 mL  0  . nitroGLYCERIN (NITROSTAT) 0.4 MG SL tablet Place 1 tablet (0.4 mg total) under the tongue every 5 (five) minutes as needed. For chest pain.  25 tablet  11  . potassium chloride SA (K-DUR,KLOR-CON) 20 MEQ tablet Take 20 mEq by mouth daily.      . [DISCONTINUED] sitaGLIPtin (JANUVIA) 25 MG tablet Take 1 tablet (25 mg total) by mouth daily.  30 tablet  1   No current facility-administered medications for this encounter.    Filed Vitals:   12/14/13 1427  BP: 122/70  Pulse: 67  Weight: 184 lb 8 oz (83.689 kg)  SpO2: 95%   PHYSICAL EXAM: General:  Well appearing. No resp difficulty  HEENT: normal Neck: supple. JVP 7  Carotids 2+ bilaterally; no bruits. No lymphadenopathy or thryomegaly appreciated. Cor: PMI normal. Regular rate &  rhythm. 2/6 SEM LSB 2/6 MR  no s3  ICD sie ok, Lungs: clear Abdomen: soft, nontender, mildly distended. No hepatosplenomegaly. No bruits or masses. Good bowel sounds. Extremities: no cyanosis, clubbing, rash, edema. PICC in RUE Neuro: alert & orientedx3, cranial nerves grossly intact. Moves  all 4 extremities w/o difficulty. Affect pleasant.   ASSESSMENT & PLAN:  1. Chronic Systolic Heart Failure: Ischemic cardiomyopathy s/p ICD, EF 15% (4/15). - Initially had good response with milrinone but now seems to feel worse after CRT upgrade - Volume status looks ok. - She is seeing Dr. Caryl Comes today. I am going to hold lisinopril today and await Dr. Olin Pia interrogation of device.  - I will see her back next week to see if she is improved. If cough has resolved off lisinopril can consider losartan .  -Continue 0.0625 mg digoxin. (dig level < 0.3 on 10/05/13). - I am concerned that patient is nearing the need for advanced therapies. She has been seen at [facility] transplant clinic - Reinforced the need and importance of daily weights, a low sodium diet, and fluid restriction (less than 2 L a day). Instructed to call the HF clinic if weight increases more than 3 lbs overnight or 5 lbs in a week.  2. CAD: No chest pain.  Continue ASA, statin and ACE-I 3. Mitral regurgitation: S/P  mitral valve annuloplasty with CABG. Moderate to severe MR on echo in 4/15. TEE (06/2013) mitral regurg appears severe, anatomy of repaired valve does not look suitable for MV clipping and would be high risk for MV replacement. Continue to follow.  4. Hx drug abuse: Remains abstinent from drug use and ETOH for >2 years. 5. CKD stage III- baseline Cr 1.5.  6. NSVT- denies any palpitations. Continue amiodarone 200 mg BID. Can cut back to 200 daily at next visit.  7. LBBB -s/p CRT-D  Glori Bickers MD 12/14/2013

## 2013-12-14 NOTE — Addendum Note (Signed)
Encounter addended by: Scarlette Calico, RN on: 12/14/2013  3:27 PM<BR>     Documentation filed: Orders, Patient Instructions Section, Medications

## 2013-12-15 DIAGNOSIS — I5023 Acute on chronic systolic (congestive) heart failure: Secondary | ICD-10-CM | POA: Diagnosis not present

## 2013-12-15 DIAGNOSIS — E119 Type 2 diabetes mellitus without complications: Secondary | ICD-10-CM | POA: Diagnosis not present

## 2013-12-15 DIAGNOSIS — I255 Ischemic cardiomyopathy: Secondary | ICD-10-CM | POA: Diagnosis not present

## 2013-12-15 DIAGNOSIS — Z452 Encounter for adjustment and management of vascular access device: Secondary | ICD-10-CM | POA: Diagnosis not present

## 2013-12-15 DIAGNOSIS — I349 Nonrheumatic mitral valve disorder, unspecified: Secondary | ICD-10-CM | POA: Diagnosis not present

## 2013-12-15 DIAGNOSIS — I129 Hypertensive chronic kidney disease with stage 1 through stage 4 chronic kidney disease, or unspecified chronic kidney disease: Secondary | ICD-10-CM | POA: Diagnosis not present

## 2013-12-18 DIAGNOSIS — I349 Nonrheumatic mitral valve disorder, unspecified: Secondary | ICD-10-CM | POA: Diagnosis not present

## 2013-12-18 DIAGNOSIS — I129 Hypertensive chronic kidney disease with stage 1 through stage 4 chronic kidney disease, or unspecified chronic kidney disease: Secondary | ICD-10-CM | POA: Diagnosis not present

## 2013-12-18 DIAGNOSIS — I5023 Acute on chronic systolic (congestive) heart failure: Secondary | ICD-10-CM | POA: Diagnosis not present

## 2013-12-18 DIAGNOSIS — Z452 Encounter for adjustment and management of vascular access device: Secondary | ICD-10-CM | POA: Diagnosis not present

## 2013-12-18 DIAGNOSIS — I255 Ischemic cardiomyopathy: Secondary | ICD-10-CM | POA: Diagnosis not present

## 2013-12-18 DIAGNOSIS — E119 Type 2 diabetes mellitus without complications: Secondary | ICD-10-CM | POA: Diagnosis not present

## 2013-12-21 DIAGNOSIS — I5023 Acute on chronic systolic (congestive) heart failure: Secondary | ICD-10-CM | POA: Diagnosis not present

## 2013-12-21 DIAGNOSIS — I255 Ischemic cardiomyopathy: Secondary | ICD-10-CM | POA: Diagnosis not present

## 2013-12-21 DIAGNOSIS — I349 Nonrheumatic mitral valve disorder, unspecified: Secondary | ICD-10-CM | POA: Diagnosis not present

## 2013-12-21 DIAGNOSIS — I129 Hypertensive chronic kidney disease with stage 1 through stage 4 chronic kidney disease, or unspecified chronic kidney disease: Secondary | ICD-10-CM | POA: Diagnosis not present

## 2013-12-21 DIAGNOSIS — E119 Type 2 diabetes mellitus without complications: Secondary | ICD-10-CM | POA: Diagnosis not present

## 2013-12-21 DIAGNOSIS — Z452 Encounter for adjustment and management of vascular access device: Secondary | ICD-10-CM | POA: Diagnosis not present

## 2013-12-22 ENCOUNTER — Ambulatory Visit (HOSPITAL_COMMUNITY)
Admission: RE | Admit: 2013-12-22 | Discharge: 2013-12-22 | Disposition: A | Discharge status: Home / Self Care | Payer: Medicare Other | Source: Ambulatory Visit | Attending: Internal Medicine | Admitting: Internal Medicine

## 2013-12-22 ENCOUNTER — Encounter (HOSPITAL_COMMUNITY): Payer: Self-pay

## 2013-12-22 VITALS — BP 112/70 | HR 71 | Resp 18 | Wt 186.4 lb

## 2013-12-22 DIAGNOSIS — Z87898 Personal history of other specified conditions: Secondary | ICD-10-CM | POA: Insufficient documentation

## 2013-12-22 DIAGNOSIS — N183 Chronic kidney disease, stage 3 unspecified: Secondary | ICD-10-CM

## 2013-12-22 DIAGNOSIS — I472 Ventricular tachycardia: Secondary | ICD-10-CM | POA: Diagnosis not present

## 2013-12-22 DIAGNOSIS — I10 Essential (primary) hypertension: Secondary | ICD-10-CM

## 2013-12-22 DIAGNOSIS — I340 Nonrheumatic mitral (valve) insufficiency: Secondary | ICD-10-CM | POA: Diagnosis not present

## 2013-12-22 DIAGNOSIS — I2510 Atherosclerotic heart disease of native coronary artery without angina pectoris: Secondary | ICD-10-CM | POA: Diagnosis not present

## 2013-12-22 DIAGNOSIS — I447 Left bundle-branch block, unspecified: Secondary | ICD-10-CM | POA: Diagnosis not present

## 2013-12-22 DIAGNOSIS — Z794 Long term (current) use of insulin: Secondary | ICD-10-CM | POA: Insufficient documentation

## 2013-12-22 DIAGNOSIS — I480 Paroxysmal atrial fibrillation: Secondary | ICD-10-CM

## 2013-12-22 DIAGNOSIS — Z951 Presence of aortocoronary bypass graft: Secondary | ICD-10-CM | POA: Insufficient documentation

## 2013-12-22 DIAGNOSIS — I129 Hypertensive chronic kidney disease with stage 1 through stage 4 chronic kidney disease, or unspecified chronic kidney disease: Secondary | ICD-10-CM | POA: Diagnosis not present

## 2013-12-22 DIAGNOSIS — E119 Type 2 diabetes mellitus without complications: Secondary | ICD-10-CM | POA: Diagnosis not present

## 2013-12-22 DIAGNOSIS — I5022 Chronic systolic (congestive) heart failure: Secondary | ICD-10-CM

## 2013-12-22 DIAGNOSIS — I739 Peripheral vascular disease, unspecified: Secondary | ICD-10-CM | POA: Diagnosis not present

## 2013-12-22 DIAGNOSIS — I255 Ischemic cardiomyopathy: Secondary | ICD-10-CM | POA: Diagnosis not present

## 2013-12-22 DIAGNOSIS — I252 Old myocardial infarction: Secondary | ICD-10-CM | POA: Insufficient documentation

## 2013-12-22 DIAGNOSIS — Z95810 Presence of automatic (implantable) cardiac defibrillator: Secondary | ICD-10-CM | POA: Insufficient documentation

## 2013-12-22 MED ORDER — AMIODARONE HCL 200 MG PO TABS: 200.0000 mg | ORAL_TABLET | Freq: Every day | ORAL | Status: DC

## 2013-12-22 MED ORDER — LOSARTAN POTASSIUM 25 MG PO TABS: 12.5000 mg | ORAL_TABLET | Freq: Every day | ORAL | Status: DC

## 2013-12-22 NOTE — Patient Instructions (Signed)
Doing great.  Will cut your amiodarone back to 200 mg (1 tablet) daily.  Will start losartan 12.5 mg (1/2 tablet) daily. Call if you have any increase in cough.  Follow up in 1 month  Do the following things EVERYDAY: 1) Weigh yourself in the morning before breakfast. Write it down and keep it in a log. 2) Take your medicines as prescribed 3) Eat low salt foods-Limit salt (sodium) to 2000 mg per day.  4) Stay as active as you can everyday 5) Limit all fluids for the day to less than 2 liters 6)

## 2013-12-22 NOTE — Progress Notes (Addendum)
Patient ID: [name], female   DOB: 03-04-1955, 58 y.o.   MRN: [id number]  PCP: Dr. Nicoletta Dress (Internal Medicine)  HPI: 58 year old female with history of HTN, DM2, past polysubstance abuse (ETOH, tobacco, cocaine), CAD s/p CABG x5 with MV annuloplasty (2007), ICM s/p ICD  (CRT upgrade 02/54), chronic systolic HF EF 27%, severe MR, CKD stage III and PAD. Blood type O+  Presented in 2007 with acute anterior MI with totalled LAD in setting of cocaine use. At time of cath LAD, LCX and RCA occluded. EF 45%. Had abrupt stent occlusion the next day and had to go back to the lab. 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   Admitted 8/2-8/7 for syncope s/p ICD shock. Concern that VT was possibly from ischemia and taken for [facility]. During cath patient had vagal response and BP dropped to 50s and co-ox was in the upper 40s. Placed on Levophed and transferred to floor. Started on Amiodarone.   On 9/25 underwent RHC for low output symptoms. Hemodynamic borderline. Swan left in and she was observed. Cardiac output dropped while in hospital so started on milrinone. Discharged home. Underwent CRT-D upgrade on 12/02/13  Follow up for Heart Failure: Since last visit saw EP and device working well and no changes. Doing well and feels great. Denies SOB, orthopnea, CP or edema. DOE with moderate exertion. Working 4 hrs daily with no issues. Milrinone at 0.25 mcg through PICC. Cough has eased up but not constantly gone. Denies dizziness or fatigue. Weight at home 183-185 lbs. Following a low salt diet and drinking less than 2L a day.   Echos: 05/19/2012  EF 15% 2/15 EF 15%, diffuse hypokinesis, restrictive diastolic function, s/p MV repair with moderate-severe MR.   CPX: 06/17/12 Peak VO2 14.8 (predicted peak VO2 68.5%), VE/VCO2 slope 43.4 OUES: 1.19, Peak RER 1.12 Vent threshold 11.3 (pred peak VO2 52.3%)  CPX (3/15): peak VO2 15.6 (predicted peak VO2 74.5%) VE/VO2 40.8, RER  1.2   LHC (10/2013) 1) severe native CAD with all bypass grafts patent  11/01/13 RHC RA = 5  RV = 47/3/10  PA = 54/16 (30)  PCW = 14 v = 20  Fick cardiac output/index = 4.8/2.6  Thermo CO/CI = 3.7/2.0  PVR = 4.3 WU  FA sat = 95%  PA sat = 59%, 59%   Labs:  08/12/12 - on lipitor 40 mg daily Cholesterol 180 Triglyceride 195 HDL 29 LDL 112 K 4.5, creatinine 1.4 11/16/12 K 4.0 Labs 1.5 Pro BNP 181 02/14/13: K+ 4.6, Cr 1.33, Dig level 1.8, TSH 2.97 3/15: K 4.2, creatinine 1.07, HCT 32.4 06/2013: Dig level 0.3, pro-BNP 899, Cholesterol 140, TG 106, HDL 36, LDL 82, Cr 1.5, K+ 4.9 11/11/13: K 4.1 Creatinine 1.8   FH: Mother deceased: CAD, DM2, HTN        Father deceased: stroke  SH: Works odds/end jobs for [employer]; disabled. Lives in [place] with 2 sons.   ROS: All systems negative except as listed in HPI, PMH and Problem List.  Past Medical History  Diagnosis Date  . Coronary artery disease     a. s/p CABG x 5 with MV annuloplasty 2007   . Diabetes mellitus   . Hypertension   . Chronic systolic heart failure     a. ICM b. ECHO (06/2013): EF 15%, severe MR (06/2013) c. RHC (10/2013): RA 5, RV 23/2/5, PA 25/6 (14), PCWP 12, Fick CO/CI: 3.2/1.7, PVR  0.7 WU, PA 45%, 47% and 55% (with levophed 5), vagal response during cath   . Polysubstance abuse     history of  (cocaine, tobacco and ETOH)  . V-tach   . Ischemic cardiomyopathy     a. s/p ICD b. LHC (10/04/13): 1. Severe native CAD with all bypass grafts patent    . Implantable defibrillator   medtronic   . LBBB (left bundle branch block)     Current Outpatient Prescriptions  Medication Sig Dispense Refill  . acetaminophen (TYLENOL) 325 MG tablet Take 325 mg by mouth every 6 (six) hours as needed for moderate pain.      [name] allopurinol (ZYLOPRIM) 100 MG tablet Take 100-200 mg by mouth 2 (two) times daily. Takes 200 mg in the morning and 100 mg in the evening      . amiodarone (PACERONE) 200 MG tablet Take 200 mg by mouth 2 (two)  times daily.      [name] aspirin EC 81 MG tablet Take 81 mg by mouth daily.      [name] atorvastatin (LIPITOR) 80 MG tablet Take 1 tablet (80 mg total) by mouth daily.  30 tablet  11  . digoxin (LANOXIN) 0.125 MG tablet Take 0.0625 mg by mouth every other day.      . diphenhydrAMINE (BENADRYL) 25 mg capsule Take 50 mg by mouth at bedtime.       . fluticasone (FLONASE) 50 MCG/ACT nasal spray Place 1 spray into both nostrils daily.  16 g  2  . furosemide (LASIX) 40 MG tablet 80 mg (2 tablets) in the am. If weight increases more than 3 lbs day or 5 lbs week take extra 40 mg (1 tablet)      . HYDROcodone-acetaminophen (NORCO/VICODIN) 5-325 MG per tablet Take 1 tablet by mouth every 6 (six) hours as needed for moderate pain.  10 tablet  0  . insulin glargine (LANTUS) 100 UNIT/ML injection Inject 0.3 mLs (30 Units total) into the skin at bedtime.  10 mL  11  . insulin lispro (HUMALOG) 100 UNIT/ML injection Inject 6 Units into the skin 3 (three) times daily as needed for high blood sugar (over 150).       . milrinone (PRIMACOR) 20 MG/100ML SOLN infusion Inject 20.425 mcg/min into the vein continuous.  100 mL  0  . nitroGLYCERIN (NITROSTAT) 0.4 MG SL tablet Place 1 tablet (0.4 mg total) under the tongue every 5 (five) minutes as needed. For chest pain.  25 tablet  11  . potassium chloride SA (K-DUR,KLOR-CON) 20 MEQ tablet Take 20 mEq by mouth daily.      . [DISCONTINUED] sitaGLIPtin (JANUVIA) 25 MG tablet Take 1 tablet (25 mg total) by mouth daily.  30 tablet  1   No current facility-administered medications for this encounter.    Filed Vitals:   12/22/13 1439  BP: 112/70  Pulse: 71  Resp: 18  Weight: 186 lb 6 oz (84.539 kg)  SpO2: 96%   PHYSICAL EXAM: General:  Well appearing. No resp difficulty  HEENT: normal Neck: supple. JVP 7  Carotids 2+ bilaterally; no bruits. No lymphadenopathy or thryomegaly appreciated. Cor: PMI normal. Regular rate & rhythm. 2/6 SEM LSB 2/6 MR  no s3  Lungs: clear Abdomen:  soft, nontender, mildly distended. No hepatosplenomegaly. No bruits or masses. Good bowel sounds. Extremities: no cyanosis, clubbing, rash, edema. PICC in RUE Neuro: alert & orientedx3, cranial nerves grossly intact. Moves all 4 extremities w/o difficulty. Affect pleasant.   ASSESSMENT & PLAN:  1. Chronic Systolic Heart Failure: Ischemic cardiomyopathy s/p CRT-D, EF 15% (4/15). - NYHA II symptoms and volume status stable. Will continue lasix 80 mg q am and 40 mg q pm as needed for weight gain.  - Cough is much improved after stopping lisinopril. Will try to start losartan 12.5 mg daily. Told to call if has increase in cough. Check BMET next week. - Continue digoxin 0.0625 mcg daily. (dig level < 0.3 on 10/05/13). - Continue milrinone 0.25 mcg through PICC.  - I am concerned that patient is nearing the need for advanced therapies, She has been seen at [facility] transplant clinic and goes back 02/02/14. Told to call if she starts feeling worse. Would like patient to hopefully get tx and not need to be bridged with LVAD since she has already had 1 sternootomy.  - Reinforced the need and importance of daily weights, a low sodium diet, and fluid restriction (less than 2 L a day). Instructed to call the HF clinic if weight increases more than 3 lbs overnight or 5 lbs in a week.  2. CAD: No chest pain.  Continue ASA and statin.  3. Mitral regurgitation: S/P  mitral valve annuloplasty with CABG. Moderate to severe MR on echo in 4/15. TEE (06/2013) mitral regurg appears severe, anatomy of repaired valve does not look suitable for MV clipping and would be high risk for MV replacement. Continue to follow.  4. Hx drug abuse: Remains abstinent from drug use and ETOH for >2 years. 5. CKD stage III- baseline Cr 1.5. Had labs checked with [facility] yesterday and awaiting results.  6. NSVT- denies any palpitations. Cut amiodarone back to 200 mg daily.  7. LBBB -s/p CRT-D   F/U 1 month Rande Brunt  NP-C 12/22/2013

## 2013-12-23 ENCOUNTER — Encounter: Payer: Self-pay | Admitting: Internal Medicine

## 2013-12-23 ENCOUNTER — Telehealth (HOSPITAL_COMMUNITY): Payer: Self-pay | Admitting: *Deleted

## 2013-12-23 NOTE — Addendum Note (Signed)
Encounter addended by: Rande Brunt, NP on: 12/23/2013  2:32 PM<BR>     Documentation filed: Notes Section

## 2013-12-23 NOTE — Telephone Encounter (Signed)
Received labs from [facility] K 4.1, bun 31, cr 1.70 per Junie Bame, NP have pt hold Lasix for 1 day, pt is aware and agreeable, pt is getting weekly labs with [facility]

## 2013-12-24 ENCOUNTER — Telehealth: Payer: Self-pay | Admitting: Physician Assistant

## 2013-12-24 DIAGNOSIS — Z452 Encounter for adjustment and management of vascular access device: Secondary | ICD-10-CM | POA: Diagnosis not present

## 2013-12-24 DIAGNOSIS — I5023 Acute on chronic systolic (congestive) heart failure: Secondary | ICD-10-CM | POA: Diagnosis not present

## 2013-12-24 DIAGNOSIS — I349 Nonrheumatic mitral valve disorder, unspecified: Secondary | ICD-10-CM | POA: Diagnosis not present

## 2013-12-24 DIAGNOSIS — I129 Hypertensive chronic kidney disease with stage 1 through stage 4 chronic kidney disease, or unspecified chronic kidney disease: Secondary | ICD-10-CM | POA: Diagnosis not present

## 2013-12-24 DIAGNOSIS — E119 Type 2 diabetes mellitus without complications: Secondary | ICD-10-CM | POA: Diagnosis not present

## 2013-12-24 DIAGNOSIS — I255 Ischemic cardiomyopathy: Secondary | ICD-10-CM | POA: Diagnosis not present

## 2013-12-24 NOTE — Telephone Encounter (Signed)
Called by [facility].  Patient's weight up three pounds since yesterday with increased dyspnea after a few high sodium meals.  Coincidentally she was asked to hold her lasix because of increased SCr.   I advised RN to give Lasix 80mg , usual dose, and can hold tomorrow if symptoms improve.  Yarielys Beed, PAC

## 2013-12-27 ENCOUNTER — Encounter: Payer: Self-pay | Admitting: Adult Health

## 2013-12-27 DIAGNOSIS — I129 Hypertensive chronic kidney disease with stage 1 through stage 4 chronic kidney disease, or unspecified chronic kidney disease: Secondary | ICD-10-CM | POA: Diagnosis not present

## 2013-12-27 DIAGNOSIS — Z452 Encounter for adjustment and management of vascular access device: Secondary | ICD-10-CM | POA: Diagnosis not present

## 2013-12-27 DIAGNOSIS — E119 Type 2 diabetes mellitus without complications: Secondary | ICD-10-CM | POA: Diagnosis not present

## 2013-12-27 DIAGNOSIS — I255 Ischemic cardiomyopathy: Secondary | ICD-10-CM | POA: Diagnosis not present

## 2013-12-27 DIAGNOSIS — N183 Chronic kidney disease, stage 3 (moderate): Secondary | ICD-10-CM | POA: Diagnosis not present

## 2013-12-27 DIAGNOSIS — I349 Nonrheumatic mitral valve disorder, unspecified: Secondary | ICD-10-CM | POA: Diagnosis not present

## 2013-12-27 DIAGNOSIS — I5023 Acute on chronic systolic (congestive) heart failure: Secondary | ICD-10-CM | POA: Diagnosis not present

## 2013-12-29 ENCOUNTER — Encounter: Payer: Self-pay | Admitting: Internal Medicine

## 2013-12-30 DIAGNOSIS — Z452 Encounter for adjustment and management of vascular access device: Secondary | ICD-10-CM | POA: Diagnosis not present

## 2013-12-30 DIAGNOSIS — I349 Nonrheumatic mitral valve disorder, unspecified: Secondary | ICD-10-CM | POA: Diagnosis not present

## 2013-12-30 DIAGNOSIS — I5023 Acute on chronic systolic (congestive) heart failure: Secondary | ICD-10-CM | POA: Diagnosis not present

## 2013-12-30 DIAGNOSIS — I129 Hypertensive chronic kidney disease with stage 1 through stage 4 chronic kidney disease, or unspecified chronic kidney disease: Secondary | ICD-10-CM | POA: Diagnosis not present

## 2013-12-30 DIAGNOSIS — E119 Type 2 diabetes mellitus without complications: Secondary | ICD-10-CM | POA: Diagnosis not present

## 2013-12-30 DIAGNOSIS — I255 Ischemic cardiomyopathy: Secondary | ICD-10-CM | POA: Diagnosis not present

## 2014-01-02 ENCOUNTER — Encounter: Payer: Self-pay | Admitting: Anesthesiology

## 2014-01-02 DIAGNOSIS — I129 Hypertensive chronic kidney disease with stage 1 through stage 4 chronic kidney disease, or unspecified chronic kidney disease: Secondary | ICD-10-CM | POA: Diagnosis not present

## 2014-01-02 DIAGNOSIS — Z452 Encounter for adjustment and management of vascular access device: Secondary | ICD-10-CM | POA: Diagnosis not present

## 2014-01-02 DIAGNOSIS — I349 Nonrheumatic mitral valve disorder, unspecified: Secondary | ICD-10-CM | POA: Diagnosis not present

## 2014-01-02 DIAGNOSIS — I255 Ischemic cardiomyopathy: Secondary | ICD-10-CM | POA: Diagnosis not present

## 2014-01-02 DIAGNOSIS — E119 Type 2 diabetes mellitus without complications: Secondary | ICD-10-CM | POA: Diagnosis not present

## 2014-01-02 DIAGNOSIS — I5023 Acute on chronic systolic (congestive) heart failure: Secondary | ICD-10-CM | POA: Diagnosis not present

## 2014-01-02 DIAGNOSIS — N183 Chronic kidney disease, stage 3 (moderate): Secondary | ICD-10-CM | POA: Diagnosis not present

## 2014-01-04 ENCOUNTER — Telehealth (HOSPITAL_COMMUNITY): Payer: Self-pay | Admitting: Vascular Surgery

## 2014-01-04 DIAGNOSIS — I5022 Chronic systolic (congestive) heart failure: Secondary | ICD-10-CM

## 2014-01-04 NOTE — Telephone Encounter (Signed)
Refill Allopurinol

## 2014-01-05 DIAGNOSIS — Z452 Encounter for adjustment and management of vascular access device: Secondary | ICD-10-CM | POA: Diagnosis not present

## 2014-01-05 DIAGNOSIS — I5023 Acute on chronic systolic (congestive) heart failure: Secondary | ICD-10-CM | POA: Diagnosis not present

## 2014-01-05 DIAGNOSIS — I349 Nonrheumatic mitral valve disorder, unspecified: Secondary | ICD-10-CM | POA: Diagnosis not present

## 2014-01-05 DIAGNOSIS — I255 Ischemic cardiomyopathy: Secondary | ICD-10-CM | POA: Diagnosis not present

## 2014-01-05 DIAGNOSIS — E119 Type 2 diabetes mellitus without complications: Secondary | ICD-10-CM | POA: Diagnosis not present

## 2014-01-05 DIAGNOSIS — I129 Hypertensive chronic kidney disease with stage 1 through stage 4 chronic kidney disease, or unspecified chronic kidney disease: Secondary | ICD-10-CM | POA: Diagnosis not present

## 2014-01-05 MED ORDER — ALLOPURINOL 100 MG PO TABS: 100.0000 mg | ORAL_TABLET | Freq: Two times a day (BID) | ORAL | Status: DC

## 2014-01-05 NOTE — Telephone Encounter (Signed)
As requested refills sent into pharmacy

## 2014-01-06 ENCOUNTER — Encounter: Payer: Self-pay | Admitting: Internal Medicine

## 2014-01-08 ENCOUNTER — Encounter (HOSPITAL_COMMUNITY): Payer: Self-pay | Admitting: *Deleted

## 2014-01-08 DIAGNOSIS — I349 Nonrheumatic mitral valve disorder, unspecified: Secondary | ICD-10-CM | POA: Diagnosis not present

## 2014-01-08 DIAGNOSIS — I255 Ischemic cardiomyopathy: Secondary | ICD-10-CM | POA: Diagnosis not present

## 2014-01-08 DIAGNOSIS — I129 Hypertensive chronic kidney disease with stage 1 through stage 4 chronic kidney disease, or unspecified chronic kidney disease: Secondary | ICD-10-CM | POA: Diagnosis not present

## 2014-01-08 DIAGNOSIS — Z452 Encounter for adjustment and management of vascular access device: Secondary | ICD-10-CM | POA: Diagnosis not present

## 2014-01-08 DIAGNOSIS — I5023 Acute on chronic systolic (congestive) heart failure: Secondary | ICD-10-CM | POA: Diagnosis not present

## 2014-01-08 DIAGNOSIS — E119 Type 2 diabetes mellitus without complications: Secondary | ICD-10-CM | POA: Diagnosis not present

## 2014-01-10 DIAGNOSIS — N183 Chronic kidney disease, stage 3 (moderate): Secondary | ICD-10-CM | POA: Diagnosis not present

## 2014-01-10 DIAGNOSIS — I129 Hypertensive chronic kidney disease with stage 1 through stage 4 chronic kidney disease, or unspecified chronic kidney disease: Secondary | ICD-10-CM | POA: Diagnosis not present

## 2014-01-10 DIAGNOSIS — I349 Nonrheumatic mitral valve disorder, unspecified: Secondary | ICD-10-CM | POA: Diagnosis not present

## 2014-01-10 DIAGNOSIS — I509 Heart failure, unspecified: Secondary | ICD-10-CM | POA: Diagnosis not present

## 2014-01-10 DIAGNOSIS — I255 Ischemic cardiomyopathy: Secondary | ICD-10-CM | POA: Diagnosis not present

## 2014-01-10 DIAGNOSIS — Z452 Encounter for adjustment and management of vascular access device: Secondary | ICD-10-CM | POA: Diagnosis not present

## 2014-01-10 DIAGNOSIS — E119 Type 2 diabetes mellitus without complications: Secondary | ICD-10-CM | POA: Diagnosis not present

## 2014-01-10 DIAGNOSIS — I5023 Acute on chronic systolic (congestive) heart failure: Secondary | ICD-10-CM | POA: Diagnosis not present

## 2014-01-17 ENCOUNTER — Encounter: Payer: Self-pay | Admitting: Internal Medicine

## 2014-01-17 DIAGNOSIS — Z452 Encounter for adjustment and management of vascular access device: Secondary | ICD-10-CM | POA: Diagnosis not present

## 2014-01-17 DIAGNOSIS — I5023 Acute on chronic systolic (congestive) heart failure: Secondary | ICD-10-CM | POA: Diagnosis not present

## 2014-01-17 DIAGNOSIS — I349 Nonrheumatic mitral valve disorder, unspecified: Secondary | ICD-10-CM | POA: Diagnosis not present

## 2014-01-17 DIAGNOSIS — I129 Hypertensive chronic kidney disease with stage 1 through stage 4 chronic kidney disease, or unspecified chronic kidney disease: Secondary | ICD-10-CM | POA: Diagnosis not present

## 2014-01-17 DIAGNOSIS — E119 Type 2 diabetes mellitus without complications: Secondary | ICD-10-CM | POA: Diagnosis not present

## 2014-01-17 DIAGNOSIS — I255 Ischemic cardiomyopathy: Secondary | ICD-10-CM | POA: Diagnosis not present

## 2014-01-17 DIAGNOSIS — N183 Chronic kidney disease, stage 3 (moderate): Secondary | ICD-10-CM | POA: Diagnosis not present

## 2014-01-23 ENCOUNTER — Encounter (HOSPITAL_COMMUNITY): Payer: Self-pay

## 2014-01-23 ENCOUNTER — Ambulatory Visit (HOSPITAL_COMMUNITY)
Admission: RE | Admit: 2014-01-23 | Discharge: 2014-01-23 | Disposition: A | Discharge status: Home / Self Care | Payer: Medicare Other | Source: Ambulatory Visit | Attending: Internal Medicine | Admitting: Internal Medicine

## 2014-01-23 VITALS — BP 117/66 | HR 67 | Resp 18 | Wt 193.0 lb

## 2014-01-23 DIAGNOSIS — N183 Chronic kidney disease, stage 3 (moderate): Secondary | ICD-10-CM | POA: Insufficient documentation

## 2014-01-23 DIAGNOSIS — I5022 Chronic systolic (congestive) heart failure: Secondary | ICD-10-CM | POA: Diagnosis not present

## 2014-01-23 DIAGNOSIS — I509 Heart failure, unspecified: Secondary | ICD-10-CM | POA: Diagnosis not present

## 2014-01-23 DIAGNOSIS — I255 Ischemic cardiomyopathy: Secondary | ICD-10-CM

## 2014-01-23 DIAGNOSIS — I447 Left bundle-branch block, unspecified: Secondary | ICD-10-CM | POA: Insufficient documentation

## 2014-01-23 DIAGNOSIS — I340 Nonrheumatic mitral (valve) insufficiency: Secondary | ICD-10-CM | POA: Insufficient documentation

## 2014-01-23 DIAGNOSIS — I472 Ventricular tachycardia: Secondary | ICD-10-CM | POA: Diagnosis not present

## 2014-01-23 DIAGNOSIS — I2510 Atherosclerotic heart disease of native coronary artery without angina pectoris: Secondary | ICD-10-CM | POA: Diagnosis not present

## 2014-01-23 DIAGNOSIS — B351 Tinea unguium: Secondary | ICD-10-CM | POA: Diagnosis not present

## 2014-01-23 DIAGNOSIS — I4729 Other ventricular tachycardia: Secondary | ICD-10-CM

## 2014-01-23 NOTE — Patient Instructions (Signed)
Arrive to main entrance of [facility] (Entrance A) to check in for [place] placement Wednesday 01/25/2014 at 12:30.  Take lasix 80 mg twice daily today and tomorrow, then Wednesday reduce to 80mg  once daily.  Follow up with our clinic in 4 weeks with the Doctor.  Happy Thanksgiving!  Do the following things EVERYDAY: 1) Weigh yourself in the morning before breakfast. Write it down and keep it in a log. 2) Take your medicines as prescribed 3) Eat low salt foods-Limit salt (sodium) to 2000 mg per day.  4) Stay as active as you can everyday 5) Limit all fluids for the day to less than 2 liters

## 2014-01-23 NOTE — Progress Notes (Signed)
Patient ID: [name], female   DOB: [birth year], 58 y.o.   MRN: [id number] PCP: Dr. Nicoletta Dress (Internal Medicine)  HPI: 58 year old female with history of HTN, DM2, past polysubstance abuse (ETOH, tobacco, cocaine), CAD s/p CABG x5 with MV annuloplasty (2007), ICM s/p ICD  (CRT upgrade 14/97), chronic systolic HF EF 02%, severe MR, CKD stage III and PAD. Blood type O+  Presented in 2007 with acute anterior MI with totalled LAD in setting of cocaine use. At time of cath LAD, LCX and RCA occluded. EF 45%. Had abrupt stent occlusion the next day and had to go back to the lab. 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   Admitted 8/2-8/7 for syncope s/p ICD shock. Concern that VT was possibly from ischemia and taken for [facility]. During cath patient had vagal response and BP dropped to 50s and co-ox was in the upper 40s. Placed on Levophed and transferred to floor. Started on Amiodarone.   On 9/25 underwent RHC for low output symptoms. Hemodynamic borderline. Swan left in and she was observed. Cardiac output dropped while in hospital so started on milrinone. Discharged home. Underwent CRT-D upgrade on 12/02/13  Follow up for Heart Failure: She returns for follow up. Last visit Ace stopped and she started on losartan  12.5 mg daily. She has tolerated this well.  Mild dyspnea with exertion. Able to sleep flat in the bad. She has follow up at [facility] 02/01/14 for potential transplant. Weight at 186-189 pounds. Says she has been taking an extra 40 mg of lasix for the last week. . Milrinone continues at  0.25 mcg through PICC. She says that she had redness at PICC insertion site last week. Had [facility] visit next week. Tries to follow low salt diet and limit fluid intake to < 2 liters per day.    Echos: 05/19/2012  EF 15% 2/15 EF 15%, diffuse hypokinesis, restrictive diastolic function, s/p MV repair with moderate-severe MR.   CPX: 06/17/12 Peak VO2 14.8 (predicted peak VO2  68.5%), VE/VCO2 slope 43.4 OUES: 1.19, Peak RER 1.12 Vent threshold 11.3 (pred peak VO2 52.3%)  CPX (3/15): peak VO2 15.6 (predicted peak VO2 74.5%) VE/VO2 40.8, RER 1.2   LHC (10/2013) 1) severe native CAD with all bypass grafts patent  11/01/13 RHC RA = 5  RV = 47/3/10  PA = 54/16 (30)  PCW = 14 v = 20  Fick cardiac output/index = 4.8/2.6  Thermo CO/CI = 3.7/2.0  PVR = 4.3 WU  FA sat = 95%  PA sat = 59%, 59%   Labs:  08/12/12 - on lipitor 40 mg daily Cholesterol 180 Triglyceride 195 HDL 29 LDL 112 K 4.5, creatinine 1.4 11/16/12 K 4.0 Labs 1.5 Pro BNP 181 02/14/13: K+ 4.6, Cr 1.33, Dig level 1.8, TSH 2.97 3/15: K 4.2, creatinine 1.07, HCT 32.4 06/2013: Dig level 0.3, pro-BNP 899, Cholesterol 140, TG 106, HDL 36, LDL 82, Cr 1.5, K+ 4.9 11/11/13: K 4.1 Creatinine 1.8   FH: Mother deceased: CAD, DM2, HTN        Father deceased: stroke  SH: Works odds/end jobs for [employer]; disabled. Lives in [place] with 2 sons.   ROS: All systems negative except as listed in HPI, PMH and Problem List.  Past Medical History  Diagnosis Date  . Coronary artery disease     a. s/p CABG x 5 with MV annuloplasty 2007   . Diabetes mellitus   . Hypertension   .  Chronic systolic heart failure     a. ICM b. ECHO (06/2013): EF 15%, severe MR (06/2013) c. RHC (10/2013): RA 5, RV 23/2/5, PA 25/6 (14), PCWP 12, Fick CO/CI: 3.2/1.7, PVR 0.7 WU, PA 45%, 47% and 55% (with levophed 5), vagal response during cath   . Polysubstance abuse     history of  (cocaine, tobacco and ETOH)  . V-tach   . Ischemic cardiomyopathy     a. s/p ICD b. LHC (10/04/13): 1. Severe native CAD with all bypass grafts patent    . Implantable defibrillator   medtronic   . LBBB (left bundle branch block)     Current Outpatient Prescriptions  Medication Sig Dispense Refill  . allopurinol (ZYLOPRIM) 100 MG tablet Take 1-2 tablets (100-200 mg total) by mouth 2 (two) times daily. Takes 200 mg in the morning and 100 mg in the evening 90  tablet 3  . amiodarone (PACERONE) 200 MG tablet Take 1 tablet (200 mg total) by mouth daily. 30 tablet 3  . aspirin EC 81 MG tablet Take 81 mg by mouth daily.    [name] atorvastatin (LIPITOR) 80 MG tablet Take 1 tablet (80 mg total) by mouth daily. 30 tablet 11  . digoxin (LANOXIN) 0.125 MG tablet Take 0.0625 mg by mouth every other day.    . diphenhydrAMINE (BENADRYL) 25 mg capsule Take 50 mg by mouth daily as needed for allergies.     . fluticasone (FLONASE) 50 MCG/ACT nasal spray Place 1 spray into both nostrils daily. (Patient taking differently: Place 1 spray into both nostrils daily as needed for rhinitis. ) 16 g 2  . furosemide (LASIX) 40 MG tablet Take 80 mg by mouth daily. 80 mg (2 tablets) in the am. If weight increases more than 3 lbs day or 5 lbs week take extra 40 mg (1 tablet)    . insulin glargine (LANTUS) 100 UNIT/ML injection Inject 0.3 mLs (30 Units total) into the skin at bedtime. 10 mL 11  . losartan (COZAAR) 25 MG tablet Take 0.5 tablets (12.5 mg total) by mouth daily. 15 tablet 3  . milrinone (PRIMACOR) 20 MG/100ML SOLN infusion Inject 20.425 mcg/min into the vein continuous. 100 mL 0  . potassium chloride SA (K-DUR,KLOR-CON) 20 MEQ tablet Take 20 mEq by mouth daily.    . insulin lispro (HUMALOG) 100 UNIT/ML injection Inject 6 Units into the skin 3 (three) times daily as needed for high blood sugar (over 150).     . nitroGLYCERIN (NITROSTAT) 0.4 MG SL tablet Place 1 tablet (0.4 mg total) under the tongue every 5 (five) minutes as needed. For chest pain. (Patient not taking: Reported on 01/23/2014) 25 tablet 11  . [DISCONTINUED] sitaGLIPtin (JANUVIA) 25 MG tablet Take 1 tablet (25 mg total) by mouth daily. 30 tablet 1   No current facility-administered medications for this encounter.    Filed Vitals:   01/23/14 1438  BP: 117/66  Pulse: 67  Resp: 18  Weight: 193 lb (87.544 kg)  SpO2: 99%   PHYSICAL EXAM: General:  Well appearing. No resp difficulty . Sister present  HEENT:  normal Neck: supple. JVP 9-10  Carotids 2+ bilaterally; no bruits. No lymphadenopathy or thryomegaly appreciated. Cor: PMI normal. Regular rate & rhythm. 2/6 SEM LSB 2/6 MR  no s3  Lungs: clear Abdomen: obese,soft, nontender, mildly distended. No hepatosplenomegaly. No bruits or masses. Good bowel sounds. Extremities: no cyanosis, clubbing, rash, edema. PICC in RUE black at insertion site.  Neuro: alert & orientedx3, cranial nerves grossly  intact. Moves all 4 extremities w/o difficulty. Affect pleasant.     ASSESSMENT & PLAN:  1. Chronic Systolic Heart Failure: Ischemic cardiomyopathy s/p CRT-D, EF 15% (4/15). - NYHA II-III symptoms and volume status elevated. Increase lasix to 80 mg twice a day for 2 days then cut back to lasix 80 mg daily with an extra 40 mg as needed for 3 pound weight gain. stable. Will continue lasix 80 mg q am and 40 mg q pm as needed for weight gain.  -Continue  losartan 12.5 mg daily. Check BMET next week.  - Continue digoxin 0.0625 mcg daily. (dig level < 0.3 on 10/05/13). - Continue milrinone 0.25 mcg through PICC. I am concerned about potential infection at PICC site. Had erythema noted last week by [facility]. Will schedule hickman cathter this week.  - In the event she needs advanced therapies. She will receive LVAD at [facility] and transplant at [facility]. Has follow up at [facility] next week.  - Reinforced the need and importance of daily weights, a low sodium diet, and fluid restriction (less than 2 L a day). Instructed to call the HF clinic if weight increases more than 3 lbs overnight or 5 lbs in a week.  2. CAD: No chest pain.  Continue ASA and statin.  3. Mitral regurgitation: S/P  mitral valve annuloplasty with CABG. Moderate to severe MR on echo in 4/15. TEE (06/2013) mitral regurg appears severe, anatomy of repaired valve does not look suitable for MV clipping and would be high risk for MV replacement. Continue to follow.  4. Hx drug abuse: Remains abstinent from drug  use and ETOH for >2 years. 5. CKD stage III- baseline Cr 1.5. She will have  labs checked by [facility] tomorrow. .  6. NSVT- denies any palpitations. Continue amiodarone back to 200 mg daily.  7. LBBB--s/p CRT-D   Follow up 1 month with MD.  Kinzlie Harney NP-C 01/23/2014

## 2014-01-24 DIAGNOSIS — I129 Hypertensive chronic kidney disease with stage 1 through stage 4 chronic kidney disease, or unspecified chronic kidney disease: Secondary | ICD-10-CM | POA: Diagnosis not present

## 2014-01-24 DIAGNOSIS — I255 Ischemic cardiomyopathy: Secondary | ICD-10-CM | POA: Diagnosis not present

## 2014-01-24 DIAGNOSIS — Z452 Encounter for adjustment and management of vascular access device: Secondary | ICD-10-CM | POA: Diagnosis not present

## 2014-01-24 DIAGNOSIS — I349 Nonrheumatic mitral valve disorder, unspecified: Secondary | ICD-10-CM | POA: Diagnosis not present

## 2014-01-24 DIAGNOSIS — I5023 Acute on chronic systolic (congestive) heart failure: Secondary | ICD-10-CM | POA: Diagnosis not present

## 2014-01-24 DIAGNOSIS — N183 Chronic kidney disease, stage 3 (moderate): Secondary | ICD-10-CM | POA: Diagnosis not present

## 2014-01-24 DIAGNOSIS — E119 Type 2 diabetes mellitus without complications: Secondary | ICD-10-CM | POA: Diagnosis not present

## 2014-01-25 ENCOUNTER — Ambulatory Visit (HOSPITAL_COMMUNITY)
Admission: RE | Admit: 2014-01-25 | Discharge: 2014-01-25 | Disposition: A | Discharge status: Home / Self Care | Payer: Medicare Other | Source: Ambulatory Visit | Attending: Adult Health | Admitting: Adult Health

## 2014-01-25 DIAGNOSIS — I499 Cardiac arrhythmia, unspecified: Secondary | ICD-10-CM | POA: Diagnosis not present

## 2014-01-25 DIAGNOSIS — Z452 Encounter for adjustment and management of vascular access device: Secondary | ICD-10-CM | POA: Insufficient documentation

## 2014-01-25 DIAGNOSIS — I509 Heart failure, unspecified: Secondary | ICD-10-CM

## 2014-01-25 MED ORDER — HEPARIN SOD (PORK) LOCK FLUSH 100 UNIT/ML IV SOLN
INTRAVENOUS | Status: AC
  Filled 2014-01-25: qty 10

## 2014-01-25 MED ORDER — LIDOCAINE HCL 1 % IJ SOLN
INTRAMUSCULAR | Status: AC
  Filled 2014-01-25: qty 20

## 2014-01-25 NOTE — Procedures (Signed)
RIJV tunelled PICC line Tip SVC RA No comp

## 2014-01-27 DIAGNOSIS — I349 Nonrheumatic mitral valve disorder, unspecified: Secondary | ICD-10-CM | POA: Diagnosis not present

## 2014-01-27 DIAGNOSIS — I5023 Acute on chronic systolic (congestive) heart failure: Secondary | ICD-10-CM | POA: Diagnosis not present

## 2014-01-27 DIAGNOSIS — I255 Ischemic cardiomyopathy: Secondary | ICD-10-CM | POA: Diagnosis not present

## 2014-01-27 DIAGNOSIS — E119 Type 2 diabetes mellitus without complications: Secondary | ICD-10-CM | POA: Diagnosis not present

## 2014-01-27 DIAGNOSIS — I129 Hypertensive chronic kidney disease with stage 1 through stage 4 chronic kidney disease, or unspecified chronic kidney disease: Secondary | ICD-10-CM | POA: Diagnosis not present

## 2014-01-27 DIAGNOSIS — Z452 Encounter for adjustment and management of vascular access device: Secondary | ICD-10-CM | POA: Diagnosis not present

## 2014-01-29 DIAGNOSIS — Z95810 Presence of automatic (implantable) cardiac defibrillator: Secondary | ICD-10-CM | POA: Diagnosis not present

## 2014-01-29 DIAGNOSIS — Z87891 Personal history of nicotine dependence: Secondary | ICD-10-CM | POA: Diagnosis not present

## 2014-01-29 DIAGNOSIS — N183 Chronic kidney disease, stage 3 (moderate): Secondary | ICD-10-CM | POA: Diagnosis not present

## 2014-01-29 DIAGNOSIS — I5023 Acute on chronic systolic (congestive) heart failure: Secondary | ICD-10-CM | POA: Diagnosis not present

## 2014-01-29 DIAGNOSIS — I2510 Atherosclerotic heart disease of native coronary artery without angina pectoris: Secondary | ICD-10-CM | POA: Diagnosis not present

## 2014-01-29 DIAGNOSIS — M109 Gout, unspecified: Secondary | ICD-10-CM | POA: Diagnosis not present

## 2014-01-29 DIAGNOSIS — I129 Hypertensive chronic kidney disease with stage 1 through stage 4 chronic kidney disease, or unspecified chronic kidney disease: Secondary | ICD-10-CM | POA: Diagnosis not present

## 2014-01-29 DIAGNOSIS — Z452 Encounter for adjustment and management of vascular access device: Secondary | ICD-10-CM | POA: Diagnosis not present

## 2014-01-29 DIAGNOSIS — E119 Type 2 diabetes mellitus without complications: Secondary | ICD-10-CM | POA: Diagnosis not present

## 2014-01-29 DIAGNOSIS — I255 Ischemic cardiomyopathy: Secondary | ICD-10-CM | POA: Diagnosis not present

## 2014-01-29 DIAGNOSIS — I349 Nonrheumatic mitral valve disorder, unspecified: Secondary | ICD-10-CM | POA: Diagnosis not present

## 2014-01-31 DIAGNOSIS — R5383 Other fatigue: Secondary | ICD-10-CM | POA: Diagnosis not present

## 2014-01-31 DIAGNOSIS — F17210 Nicotine dependence, cigarettes, uncomplicated: Secondary | ICD-10-CM | POA: Diagnosis not present

## 2014-01-31 DIAGNOSIS — Z452 Encounter for adjustment and management of vascular access device: Secondary | ICD-10-CM | POA: Diagnosis not present

## 2014-01-31 DIAGNOSIS — I5022 Chronic systolic (congestive) heart failure: Secondary | ICD-10-CM | POA: Diagnosis not present

## 2014-01-31 DIAGNOSIS — I739 Peripheral vascular disease, unspecified: Secondary | ICD-10-CM | POA: Diagnosis not present

## 2014-01-31 DIAGNOSIS — I482 Chronic atrial fibrillation: Secondary | ICD-10-CM | POA: Diagnosis not present

## 2014-01-31 DIAGNOSIS — I5023 Acute on chronic systolic (congestive) heart failure: Secondary | ICD-10-CM | POA: Diagnosis not present

## 2014-01-31 DIAGNOSIS — N183 Chronic kidney disease, stage 3 (moderate): Secondary | ICD-10-CM | POA: Diagnosis not present

## 2014-01-31 DIAGNOSIS — I349 Nonrheumatic mitral valve disorder, unspecified: Secondary | ICD-10-CM | POA: Diagnosis not present

## 2014-01-31 DIAGNOSIS — I255 Ischemic cardiomyopathy: Secondary | ICD-10-CM | POA: Diagnosis not present

## 2014-01-31 DIAGNOSIS — Z951 Presence of aortocoronary bypass graft: Secondary | ICD-10-CM | POA: Diagnosis not present

## 2014-01-31 DIAGNOSIS — E119 Type 2 diabetes mellitus without complications: Secondary | ICD-10-CM | POA: Diagnosis not present

## 2014-01-31 DIAGNOSIS — I129 Hypertensive chronic kidney disease with stage 1 through stage 4 chronic kidney disease, or unspecified chronic kidney disease: Secondary | ICD-10-CM | POA: Diagnosis not present

## 2014-01-31 DIAGNOSIS — Z794 Long term (current) use of insulin: Secondary | ICD-10-CM | POA: Diagnosis not present

## 2014-01-31 DIAGNOSIS — I252 Old myocardial infarction: Secondary | ICD-10-CM | POA: Diagnosis not present

## 2014-01-31 DIAGNOSIS — Z7901 Long term (current) use of anticoagulants: Secondary | ICD-10-CM | POA: Diagnosis not present

## 2014-01-31 DIAGNOSIS — R0602 Shortness of breath: Secondary | ICD-10-CM | POA: Diagnosis not present

## 2014-01-31 DIAGNOSIS — Z95810 Presence of automatic (implantable) cardiac defibrillator: Secondary | ICD-10-CM | POA: Diagnosis not present

## 2014-01-31 DIAGNOSIS — Z7982 Long term (current) use of aspirin: Secondary | ICD-10-CM | POA: Diagnosis not present

## 2014-01-31 DIAGNOSIS — I2510 Atherosclerotic heart disease of native coronary artery without angina pectoris: Secondary | ICD-10-CM | POA: Diagnosis not present

## 2014-02-07 ENCOUNTER — Telehealth (HOSPITAL_COMMUNITY): Payer: Self-pay | Admitting: *Deleted

## 2014-02-07 DIAGNOSIS — I349 Nonrheumatic mitral valve disorder, unspecified: Secondary | ICD-10-CM | POA: Diagnosis not present

## 2014-02-07 DIAGNOSIS — E119 Type 2 diabetes mellitus without complications: Secondary | ICD-10-CM | POA: Diagnosis not present

## 2014-02-07 DIAGNOSIS — I5022 Chronic systolic (congestive) heart failure: Secondary | ICD-10-CM | POA: Diagnosis not present

## 2014-02-07 DIAGNOSIS — Z452 Encounter for adjustment and management of vascular access device: Secondary | ICD-10-CM | POA: Diagnosis not present

## 2014-02-07 DIAGNOSIS — N183 Chronic kidney disease, stage 3 (moderate): Secondary | ICD-10-CM | POA: Diagnosis not present

## 2014-02-07 DIAGNOSIS — I129 Hypertensive chronic kidney disease with stage 1 through stage 4 chronic kidney disease, or unspecified chronic kidney disease: Secondary | ICD-10-CM | POA: Diagnosis not present

## 2014-02-07 DIAGNOSIS — I5023 Acute on chronic systolic (congestive) heart failure: Secondary | ICD-10-CM | POA: Diagnosis not present

## 2014-02-07 DIAGNOSIS — I255 Ischemic cardiomyopathy: Secondary | ICD-10-CM | POA: Diagnosis not present

## 2014-02-07 DIAGNOSIS — I482 Chronic atrial fibrillation: Secondary | ICD-10-CM | POA: Diagnosis not present

## 2014-02-07 NOTE — Telephone Encounter (Signed)
Amiee called concerned about pt, she states her wt has been at 186 lb for a couple of weeks, usually it was running 183, she states pt was seen at [facility] last week and they restarted pt on Spiro 12.5 mg daily but it has not changed weight at all, she states pt has no edema but is SOB at times, she states she noticed pt was SOB with slight activity today, appt sch for tomorrow w/NP

## 2014-02-08 ENCOUNTER — Encounter (HOSPITAL_COMMUNITY): Payer: Self-pay

## 2014-02-08 ENCOUNTER — Ambulatory Visit (HOSPITAL_COMMUNITY)
Admission: RE | Admit: 2014-02-08 | Discharge: 2014-02-08 | Disposition: A | Discharge status: Home / Self Care | Payer: Medicare Other | Source: Ambulatory Visit | Attending: Internal Medicine | Admitting: Internal Medicine

## 2014-02-08 VITALS — BP 111/60 | HR 70 | Resp 18 | Wt 189.2 lb

## 2014-02-08 DIAGNOSIS — E119 Type 2 diabetes mellitus without complications: Secondary | ICD-10-CM | POA: Insufficient documentation

## 2014-02-08 DIAGNOSIS — Z95810 Presence of automatic (implantable) cardiac defibrillator: Secondary | ICD-10-CM | POA: Insufficient documentation

## 2014-02-08 DIAGNOSIS — Z823 Family history of stroke: Secondary | ICD-10-CM | POA: Insufficient documentation

## 2014-02-08 DIAGNOSIS — Z79899 Other long term (current) drug therapy: Secondary | ICD-10-CM | POA: Insufficient documentation

## 2014-02-08 DIAGNOSIS — Z8249 Family history of ischemic heart disease and other diseases of the circulatory system: Secondary | ICD-10-CM | POA: Insufficient documentation

## 2014-02-08 DIAGNOSIS — I447 Left bundle-branch block, unspecified: Secondary | ICD-10-CM | POA: Diagnosis not present

## 2014-02-08 DIAGNOSIS — I4729 Other ventricular tachycardia: Secondary | ICD-10-CM

## 2014-02-08 DIAGNOSIS — Z952 Presence of prosthetic heart valve: Secondary | ICD-10-CM | POA: Insufficient documentation

## 2014-02-08 DIAGNOSIS — Z833 Family history of diabetes mellitus: Secondary | ICD-10-CM | POA: Diagnosis not present

## 2014-02-08 DIAGNOSIS — F1021 Alcohol dependence, in remission: Secondary | ICD-10-CM | POA: Diagnosis not present

## 2014-02-08 DIAGNOSIS — F1421 Cocaine dependence, in remission: Secondary | ICD-10-CM | POA: Insufficient documentation

## 2014-02-08 DIAGNOSIS — I5022 Chronic systolic (congestive) heart failure: Secondary | ICD-10-CM | POA: Diagnosis not present

## 2014-02-08 DIAGNOSIS — Z951 Presence of aortocoronary bypass graft: Secondary | ICD-10-CM | POA: Diagnosis not present

## 2014-02-08 DIAGNOSIS — N183 Chronic kidney disease, stage 3 unspecified: Secondary | ICD-10-CM

## 2014-02-08 DIAGNOSIS — Z794 Long term (current) use of insulin: Secondary | ICD-10-CM | POA: Insufficient documentation

## 2014-02-08 DIAGNOSIS — Z87891 Personal history of nicotine dependence: Secondary | ICD-10-CM | POA: Diagnosis not present

## 2014-02-08 DIAGNOSIS — I2510 Atherosclerotic heart disease of native coronary artery without angina pectoris: Secondary | ICD-10-CM | POA: Insufficient documentation

## 2014-02-08 DIAGNOSIS — Z7982 Long term (current) use of aspirin: Secondary | ICD-10-CM | POA: Insufficient documentation

## 2014-02-08 DIAGNOSIS — I471 Supraventricular tachycardia: Secondary | ICD-10-CM | POA: Insufficient documentation

## 2014-02-08 DIAGNOSIS — I429 Cardiomyopathy, unspecified: Secondary | ICD-10-CM | POA: Diagnosis not present

## 2014-02-08 DIAGNOSIS — I472 Ventricular tachycardia: Secondary | ICD-10-CM | POA: Diagnosis not present

## 2014-02-08 DIAGNOSIS — I129 Hypertensive chronic kidney disease with stage 1 through stage 4 chronic kidney disease, or unspecified chronic kidney disease: Secondary | ICD-10-CM | POA: Diagnosis not present

## 2014-02-08 DIAGNOSIS — I340 Nonrheumatic mitral (valve) insufficiency: Secondary | ICD-10-CM | POA: Diagnosis not present

## 2014-02-08 LAB — CARBOXYHEMOGLOBIN
Carboxyhemoglobin: 0.8 % (ref 0.5–1.5)
Methemoglobin: 1.1 % (ref 0.0–1.5)
O2 Saturation: 83.6 %
Total hemoglobin: 11.2 g/dL — ABNORMAL LOW (ref 12.0–16.0)

## 2014-02-08 NOTE — Patient Instructions (Signed)
STOP Spironolactone.  Follow up at next scheduled appointment.  Merry Christmas!  Do the following things EVERYDAY: 1) Weigh yourself in the morning before breakfast. Write it down and keep it in a log. 2) Take your medicines as prescribed 3) Eat low salt foods-Limit salt (sodium) to 2000 mg per day.  4) Stay as active as you can everyday 5) Limit all fluids for the day to less than 2 liters

## 2014-02-08 NOTE — Progress Notes (Signed)
PCP: Dr. Nicoletta Dress (Internal Medicine)  HPI: 58 year old female with history of HTN, DM2, past polysubstance abuse (ETOH, tobacco, cocaine), CAD s/p CABG x5 with MV annuloplasty (2007), ICM s/p ICD  (CRT upgrade 31/54), chronic systolic HF EF 00%, severe MR, CKD stage III and PAD. Blood type O+  Presented in 2007 with acute anterior MI with totalled LAD in setting of cocaine use. At time of cath LAD, LCX and RCA occluded. EF 45%. Had abrupt stent occlusion the next day and had to go back to the lab. 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   Admitted 8/2-8/7 for syncope s/p ICD shock. Concern that VT was possibly from ischemia and taken for [facility]. During cath patient had vagal response and BP dropped to 50s and co-ox was in the upper 40s. Placed on Levophed and transferred to floor. Started on Amiodarone.   On 9/25 underwent RHC for low output symptoms. Hemodynamic borderline. Swan left in and she was observed. Cardiac output dropped while in hospital so started on milrinone. Discharged home. Underwent CRT-D upgrade on 12/02/13  Follow up for Heart Failure: She returns for an acute work in for increased dyspnea. Last week she was evaluated at [facility] and 12.5 mg spironolactone was started. .Tunneled catheter placed 01/25/14. Weight at home 184-187 pounds.Over the weekend she took  extra lasix fro 3 days. She says weight went down 1 pound.  Mild dyspnea with exertion. Able to sleep flat in the bed.  Tries to follow low salt diet and limit fluid intake to < 2 liters per day. Taking all medications.    Echos: 05/19/2012  EF 15% 2/15 EF 15%, diffuse hypokinesis, restrictive diastolic function, s/p MV repair with moderate-severe MR.   CPX: 06/17/12 Peak VO2 14.8 (predicted peak VO2 68.5%), VE/VCO2 slope 43.4 OUES: 1.19, Peak RER 1.12 Vent threshold 11.3 (pred peak VO2 52.3%)  CPX (3/15): peak VO2 15.6 (predicted peak VO2 74.5%) VE/VO2 40.8, RER 1.2   LHC  (10/2013) 1) severe native CAD with all bypass grafts patent  11/01/13 RHC RA = 5  RV = 47/3/10  PA = 54/16 (30)  PCW = 14 v = 20  Fick cardiac output/index = 4.8/2.6  Thermo CO/CI = 3.7/2.0  PVR = 4.3 WU  FA sat = 95%  PA sat = 59%, 59%   Labs:  08/12/12 - on lipitor 40 mg daily Cholesterol 180 Triglyceride 195 HDL 29 LDL 112 K 4.5, creatinine 1.4 11/16/12 K 4.0 Labs 1.5 Pro BNP 181 02/14/13: K+ 4.6, Cr 1.33, Dig level 1.8, TSH 2.97 3/15: K 4.2, creatinine 1.07, HCT 32.4 06/2013: Dig level 0.3, pro-BNP 899, Cholesterol 140, TG 106, HDL 36, LDL 82, Cr 1.5, K+ 4.9 11/11/13: K 4.1 Creatinine 1.8  01/31/14 Creatinine 1.48 K .5  02/07/14:  K 4.2 Creatinine 1.75 Magnesium 2.2 after sprionolactone 12.5 mg  was added   FH: Mother deceased: CAD, DM2, HTN        Father deceased: stroke  SH: Works odds/end jobs for [employer]; disabled. Lives in [place] with 2 sons.   ROS: All systems negative except as listed in HPI, PMH and Problem List.  Past Medical History  Diagnosis Date  . Coronary artery disease     a. s/p CABG x 5 with MV annuloplasty 2007   . Diabetes mellitus   . Hypertension   . Chronic systolic heart failure     a. ICM b. ECHO (06/2013): EF 15%, severe  MR (06/2013) c. RHC (10/2013): RA 5, RV 23/2/5, PA 25/6 (14), PCWP 12, Fick CO/CI: 3.2/1.7, PVR 0.7 WU, PA 45%, 47% and 55% (with levophed 5), vagal response during cath   . Polysubstance abuse     history of  (cocaine, tobacco and ETOH)  . V-tach   . Ischemic cardiomyopathy     a. s/p ICD b. LHC (10/04/13): 1. Severe native CAD with all bypass grafts patent    . Implantable defibrillator   medtronic   . LBBB (left bundle branch block)     Current Outpatient Prescriptions  Medication Sig Dispense Refill  . allopurinol (ZYLOPRIM) 100 MG tablet Take 1-2 tablets (100-200 mg total) by mouth 2 (two) times daily. Takes 200 mg in the morning and 100 mg in the evening 90 tablet 3  . amiodarone (PACERONE) 200 MG tablet Take 1 tablet  (200 mg total) by mouth daily. 30 tablet 3  . aspirin EC 81 MG tablet Take 81 mg by mouth daily.    [name] atorvastatin (LIPITOR) 80 MG tablet Take 1 tablet (80 mg total) by mouth daily. 30 tablet 11  . digoxin (LANOXIN) 0.125 MG tablet Take 0.0625 mg by mouth every other day.    . diphenhydrAMINE (BENADRYL) 25 mg capsule Take 50 mg by mouth daily as needed for allergies.     . fluticasone (FLONASE) 50 MCG/ACT nasal spray Place 1 spray into both nostrils daily. 16 g 2  . furosemide (LASIX) 40 MG tablet Take 80 mg by mouth daily. 80 mg (2 tablets) in the am. If weight increases more than 3 lbs day or 5 lbs week take extra 40 mg (1 tablet)    . insulin glargine (LANTUS) 100 UNIT/ML injection Inject 0.3 mLs (30 Units total) into the skin at bedtime. 10 mL 11  . insulin lispro (HUMALOG) 100 UNIT/ML injection Inject 6 Units into the skin 3 (three) times daily as needed for high blood sugar (over 150).     [name] losartan (COZAAR) 25 MG tablet Take 0.5 tablets (12.5 mg total) by mouth daily. 15 tablet 3  . milrinone (PRIMACOR) 20 MG/100ML SOLN infusion Inject 20.425 mcg/min into the vein continuous. 100 mL 0  . nitroGLYCERIN (NITROSTAT) 0.4 MG SL tablet Place 1 tablet (0.4 mg total) under the tongue every 5 (five) minutes as needed. For chest pain. 25 tablet 11  . potassium chloride SA (K-DUR,KLOR-CON) 20 MEQ tablet Take 20 mEq by mouth daily.    [name] spironolactone (ALDACTONE) 25 MG tablet Take 12.5 mg by mouth daily.    . [DISCONTINUED] sitaGLIPtin (JANUVIA) 25 MG tablet Take 1 tablet (25 mg total) by mouth daily. 30 tablet 1   No current facility-administered medications for this encounter.    Filed Vitals:   02/08/14 1342  BP: 111/60  Pulse: 70  Resp: 18  Weight: 189 lb 4 oz (85.843 kg)  SpO2: 94%   PHYSICAL EXAM: General:  Well appearing. No resp difficulty . Sister present  HEENT: normal Neck: supple. JVP 6-7   Carotids 2+ bilaterally; no bruits. No lymphadenopathy or thryomegaly appreciated. Cor:  PMI normal. Regular rate & rhythm. 2/6 SEM LSB 2/6 MR  no s3  R upper chest tunneled catheter  Lungs: clear Abdomen: obese,soft, nontender, mildly distended. No hepatosplenomegaly. No bruits or masses. Good bowel sounds. Extremities: no cyanosis, clubbing, rash, edema. Neuro: alert & orientedx3, cranial nerves grossly intact. Moves all 4 extremities w/o difficulty. Affect pleasant.     ASSESSMENT & PLAN:  1. Chronic Systolic Heart  Failure: Ischemic cardiomyopathy s/p CRT-D, EF 15% (4/15). - NYHA II-III symptoms and volume status stable.  Checked CO-OX due to occasional increased dyspnea. CO-OX 83%. So continue Milrinone 0.25 mcg.  Continue lasix 80 mg in am and 40 mg in pm. Continue extra 40 mg of lasix as needed.   Stop spiro as creatinine trending up. Repeat BMET  Next week.  -Continue  losartan 12.5 mg daily.   - Continue digoxin 0.0625 mcg daily. (dig level < 0.3 on 10/05/13). - Continue milrinone 0.25 mcg through PICC.- In the event she needs advanced therapies. She will receive LVAD at [facility] and transplant at [facility]. Has follow up at [facility] next week.  - Reinforced the need and importance of daily weights, a low sodium diet, and fluid restriction (less than 2 L a day). Instructed to call the HF clinic if weight increases more than 3 lbs overnight or 5 lbs in a week.  2. CAD: No chest pain.  Continue ASA and statin.  3. Mitral regurgitation: S/P  mitral valve annuloplasty with CABG. Moderate to severe MR on echo in 4/15. TEE (06/2013) mitral regurg appears severe, anatomy of repaired valve does not look suitable for MV clipping and would be high risk for MV replacement. Continue to follow.  4. Hx drug abuse: Remains abstinent from drug use and ETOH for >2 years. 5. CKD stage III- baseline Cr 1.5. Creatinine up to 1.75 but was started on spiro. Will stop. Check labs next week by [facility].   6. NSVT- denies any palpitations. Continue amiodarone 200 mg daily.  7. LBBB--s/p CRT-D   Follow up   2 weeks with MD.   Darrick Grinder NP-C 02/08/2014

## 2014-02-09 ENCOUNTER — Encounter (HOSPITAL_COMMUNITY): Payer: Self-pay | Admitting: Internal Medicine

## 2014-02-09 ENCOUNTER — Encounter: Payer: Self-pay | Admitting: Internal Medicine

## 2014-02-13 ENCOUNTER — Telehealth (HOSPITAL_COMMUNITY): Payer: Self-pay | Admitting: Vascular Surgery

## 2014-02-13 NOTE — Telephone Encounter (Signed)
Pt called she is out of Furosemide... She states she is out due to the "as needed" pills for the month... She tried to call in the prescription pharm will not refill .[name] KitchenMarland Kitchen

## 2014-02-14 ENCOUNTER — Other Ambulatory Visit (HOSPITAL_COMMUNITY): Payer: Self-pay | Admitting: Cardiology

## 2014-02-14 ENCOUNTER — Telehealth (HOSPITAL_COMMUNITY): Payer: Self-pay | Admitting: Vascular Surgery

## 2014-02-14 ENCOUNTER — Encounter: Payer: Self-pay | Admitting: Internal Medicine

## 2014-02-14 ENCOUNTER — Telehealth: Payer: Self-pay | Admitting: *Deleted

## 2014-02-14 DIAGNOSIS — Z452 Encounter for adjustment and management of vascular access device: Secondary | ICD-10-CM | POA: Diagnosis not present

## 2014-02-14 DIAGNOSIS — I349 Nonrheumatic mitral valve disorder, unspecified: Secondary | ICD-10-CM | POA: Diagnosis not present

## 2014-02-14 DIAGNOSIS — I255 Ischemic cardiomyopathy: Secondary | ICD-10-CM | POA: Diagnosis not present

## 2014-02-14 DIAGNOSIS — I129 Hypertensive chronic kidney disease with stage 1 through stage 4 chronic kidney disease, or unspecified chronic kidney disease: Secondary | ICD-10-CM | POA: Diagnosis not present

## 2014-02-14 DIAGNOSIS — N183 Chronic kidney disease, stage 3 (moderate): Secondary | ICD-10-CM | POA: Diagnosis not present

## 2014-02-14 DIAGNOSIS — I5023 Acute on chronic systolic (congestive) heart failure: Secondary | ICD-10-CM | POA: Diagnosis not present

## 2014-02-14 DIAGNOSIS — E119 Type 2 diabetes mellitus without complications: Secondary | ICD-10-CM | POA: Diagnosis not present

## 2014-02-14 DIAGNOSIS — I482 Chronic atrial fibrillation: Secondary | ICD-10-CM | POA: Diagnosis not present

## 2014-02-14 DIAGNOSIS — I5022 Chronic systolic (congestive) heart failure: Secondary | ICD-10-CM | POA: Diagnosis not present

## 2014-02-14 NOTE — Telephone Encounter (Signed)
Attempting to contact pt regarding "as needed" dosing If pt is using more frequently may need to change dose all together  Also reviewed message from [facility] nurse, pt weight is elevated and increased SOB Left detailed message for pt to return call

## 2014-02-14 NOTE — Telephone Encounter (Signed)
[name] called pt weight is is still up 187, pt still has SOB, NO fluid in the lungs that she can hear, Picc Line looks bad, it may need to come out.[name] Please advise

## 2014-02-14 NOTE — Telephone Encounter (Signed)
Pt called needs CBG test stips - checks CBG twice a day. Pt uses Accuchek Aviva plus. [facility].  Hilda Blades Cortney Mckinney RN 02/14/14 4:30PM

## 2014-02-14 NOTE — Telephone Encounter (Signed)
Expand All Collapse All   Attempting to contact pt regarding "as needed" dosing If pt is using more frequently may need to change dose all together  Also reviewed message from [facility] nurse, pt weight is elevated and increased SOB Left detailed message for pt to return call     pervo Dr.Bensimhon Pt soul return to office on 02/15/14 for further evaluation Pt aware 02/14/14 @ 130

## 2014-02-15 ENCOUNTER — Ambulatory Visit (HOSPITAL_BASED_OUTPATIENT_CLINIC_OR_DEPARTMENT_OTHER)
Admission: RE | Admit: 2014-02-15 | Discharge: 2014-02-15 | Disposition: A | Discharge status: Home / Self Care | Payer: Medicare Other | Source: Ambulatory Visit | Attending: Cardiology | Admitting: Cardiology

## 2014-02-15 ENCOUNTER — Encounter (HOSPITAL_COMMUNITY): Payer: Self-pay

## 2014-02-15 VITALS — BP 101/56 | HR 68 | Resp 18 | Wt 189.2 lb

## 2014-02-15 DIAGNOSIS — E119 Type 2 diabetes mellitus without complications: Secondary | ICD-10-CM

## 2014-02-15 DIAGNOSIS — Z95810 Presence of automatic (implantable) cardiac defibrillator: Secondary | ICD-10-CM | POA: Insufficient documentation

## 2014-02-15 DIAGNOSIS — I252 Old myocardial infarction: Secondary | ICD-10-CM

## 2014-02-15 DIAGNOSIS — Z794 Long term (current) use of insulin: Secondary | ICD-10-CM

## 2014-02-15 DIAGNOSIS — I447 Left bundle-branch block, unspecified: Secondary | ICD-10-CM

## 2014-02-15 DIAGNOSIS — Z87898 Personal history of other specified conditions: Secondary | ICD-10-CM

## 2014-02-15 DIAGNOSIS — I2510 Atherosclerotic heart disease of native coronary artery without angina pectoris: Secondary | ICD-10-CM | POA: Insufficient documentation

## 2014-02-15 DIAGNOSIS — I472 Ventricular tachycardia: Secondary | ICD-10-CM | POA: Insufficient documentation

## 2014-02-15 DIAGNOSIS — I255 Ischemic cardiomyopathy: Secondary | ICD-10-CM

## 2014-02-15 DIAGNOSIS — Z7982 Long term (current) use of aspirin: Secondary | ICD-10-CM

## 2014-02-15 DIAGNOSIS — N183 Chronic kidney disease, stage 3 unspecified: Secondary | ICD-10-CM

## 2014-02-15 DIAGNOSIS — Z79899 Other long term (current) drug therapy: Secondary | ICD-10-CM

## 2014-02-15 DIAGNOSIS — I739 Peripheral vascular disease, unspecified: Secondary | ICD-10-CM

## 2014-02-15 DIAGNOSIS — I340 Nonrheumatic mitral (valve) insufficiency: Secondary | ICD-10-CM | POA: Insufficient documentation

## 2014-02-15 DIAGNOSIS — L989 Disorder of the skin and subcutaneous tissue, unspecified: Secondary | ICD-10-CM

## 2014-02-15 DIAGNOSIS — Z951 Presence of aortocoronary bypass graft: Secondary | ICD-10-CM

## 2014-02-15 DIAGNOSIS — Z95828 Presence of other vascular implants and grafts: Secondary | ICD-10-CM

## 2014-02-15 DIAGNOSIS — Z6740 Type O blood, Rh positive: Secondary | ICD-10-CM

## 2014-02-15 DIAGNOSIS — I129 Hypertensive chronic kidney disease with stage 1 through stage 4 chronic kidney disease, or unspecified chronic kidney disease: Secondary | ICD-10-CM | POA: Insufficient documentation

## 2014-02-15 DIAGNOSIS — I5022 Chronic systolic (congestive) heart failure: Secondary | ICD-10-CM

## 2014-02-15 LAB — CARBOXYHEMOGLOBIN
Carboxyhemoglobin: 1.2 % (ref 0.5–1.5)
METHEMOGLOBIN: 1.1 % (ref 0.0–1.5)
O2 Saturation: 81.6 %
Total hemoglobin: 11.1 g/dL — ABNORMAL LOW (ref 12.0–16.0)

## 2014-02-15 MED ORDER — DOXYCYCLINE HYCLATE 100 MG PO TBEC: 100.0000 mg | DELAYED_RELEASE_TABLET | Freq: Two times a day (BID) | ORAL | Status: DC

## 2014-02-15 MED ORDER — GLUCOSE BLOOD VI STRP: ORAL_STRIP | Status: DC

## 2014-02-15 MED ORDER — FUROSEMIDE 40 MG PO TABS: ORAL_TABLET | ORAL | Status: DC

## 2014-02-15 NOTE — Patient Instructions (Signed)
Take 80 mg (2 tablet) of lasix tonight.   Starting tomorrow take 80 mg (2 tablets) in the morning and 40 mg (1 tablet) in the evening of lasix.  Will call with admission tomorrow afternoon.   Start doxycycline 100 mg (1 tablet) twice a day.  Do the following things EVERYDAY: 1) Weigh yourself in the morning before breakfast. Write it down and keep it in a log. 2) Take your medicines as prescribed 3) Eat low salt foods-Limit salt (sodium) to 2000 mg per day.  4) Stay as active as you can everyday 5) Limit all fluids for the day to less than 2 liters 6)

## 2014-02-15 NOTE — Telephone Encounter (Signed)
Ordered Accucheck Aviva plus strips.

## 2014-02-15 NOTE — Progress Notes (Signed)
Patient ID: [name], female   DOB: Jun 11, 1955, 58 y.o.   MRN: [id number] PCP: Dr. Nicoletta Dress (Internal Medicine)  HPI: 58 year old female with history of HTN, DM2, past polysubstance abuse (ETOH, tobacco, cocaine), CAD s/p CABG x5 with MV annuloplasty (2007), ICM s/p ICD  (CRT upgrade 35/00), chronic systolic HF EF 93%, severe MR, CKD stage III and PAD. Blood type O+  Presented in 2007 with acute anterior MI with totalled LAD in setting of cocaine use. At time of cath LAD, LCX and RCA occluded. EF 45%. Had abrupt stent occlusion the next day and had to go back to the lab. 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   Admitted 8/2-8/7 for syncope s/p ICD shock. Concern that VT was possibly from ischemia and taken for [facility]. During cath patient had vagal response and BP dropped to 50s and co-ox was in the upper 40s. Placed on Levophed and transferred to floor. Started on Amiodarone.   On 9/25 underwent RHC for low output symptoms. Hemodynamic borderline. Swan left in and she was observed. Cardiac output dropped while in hospital so started on milrinone. Discharged home. Underwent CRT-D upgrade on 12/02/13  Follow up for Heart Failure: Last visit was having increased SOB and was told to take extra lasix. Renal function was elevated and Spironolactone stopped. Co-ox was 83%. HHRN called yesterday to work patient in d/t PICC line looking "bad." Patient reports diaphoresis at nights which is increased than usual. Denies fevers or chills. Saw [facility] and not on tx list but being followed closely. Denies SOB, orthopnea, PND or CP. Able to walk the dog about 1 block without stopping. Reports getting fatigued/SOB when she has to do something constant. Weight at home 186 lbs. Tries to follow a low salt diet and is drinking less than 2L a day. Taking all medications.   Echos: 05/19/2012  EF 15% 2/15 EF 15%, diffuse hypokinesis, restrictive diastolic function, s/p MV repair with  moderate-severe MR.  4/15: EF 15%, severe MR, mod TR  CPX: 06/17/12 Peak VO2 14.8 (predicted peak VO2 68.5%), VE/VCO2 slope 43.4 OUES: 1.19, Peak RER 1.12 Vent threshold 11.3 (pred peak VO2 52.3%)  CPX (3/15): peak VO2 15.6 (predicted peak VO2 74.5%) VE/VO2 40.8, RER 1.2  LHC (10/2013) 1) severe native CAD with all bypass grafts patent  11/01/13 RHC RA = 5  RV = 47/3/10  PA = 54/16 (30)  PCW = 14 v = 20  Fick cardiac output/index = 4.8/2.6  Thermo CO/CI = 3.7/2.0  PVR = 4.3 WU  FA sat = 95%  PA sat = 59%, 59%   Labs:  08/12/12 - on lipitor 40 mg daily Cholesterol 180 Triglyceride 195 HDL 29 LDL 112 K 4.5, creatinine 1.4 11/16/12 K 4.0 Labs 1.5 Pro BNP 181 02/14/13: K+ 4.6, Cr 1.33, Dig level 1.8, TSH 2.97 3/15: K 4.2, creatinine 1.07, HCT 32.4 06/2013: Dig level 0.3, pro-BNP 899, Cholesterol 140, TG 106, HDL 36, LDL 82, Cr 1.5, K+ 4.9 11/11/13: K 4.1 Creatinine 1.8  01/31/14 Creatinine 1.48 K .5  02/07/14:  K 4.2 Creatinine 1.75 Magnesium 2.2 after sprionolactone 12.5 mg  was added   FH: Mother deceased: CAD, DM2, HTN        Father deceased: stroke  SH: Works odds/end jobs for [employer]; disabled. Lives in [place] with 2 sons.   ROS: All systems negative except as listed in HPI, PMH and Problem List.  Past Medical History  Diagnosis Date  . Coronary artery disease     a. s/p CABG x 5 with MV annuloplasty 2007   . Diabetes mellitus   . Hypertension   . Chronic systolic heart failure     a. ICM b. ECHO (06/2013): EF 15%, severe MR (06/2013) c. RHC (10/2013): RA 5, RV 23/2/5, PA 25/6 (14), PCWP 12, Fick CO/CI: 3.2/1.7, PVR 0.7 WU, PA 45%, 47% and 55% (with levophed 5), vagal response during cath   . Polysubstance abuse     history of  (cocaine, tobacco and ETOH)  . V-tach   . Ischemic cardiomyopathy     a. s/p ICD b. LHC (10/04/13): 1. Severe native CAD with all bypass grafts patent    . Implantable defibrillator   medtronic   . LBBB (left bundle branch block)     Current  Outpatient Prescriptions  Medication Sig Dispense Refill  . allopurinol (ZYLOPRIM) 100 MG tablet Take 1-2 tablets (100-200 mg total) by mouth 2 (two) times daily. Takes 200 mg in the morning and 100 mg in the evening 90 tablet 3  . amiodarone (PACERONE) 200 MG tablet Take 1 tablet (200 mg total) by mouth daily. 30 tablet 3  . aspirin EC 81 MG tablet Take 81 mg by mouth daily.    [name] atorvastatin (LIPITOR) 80 MG tablet Take 1 tablet (80 mg total) by mouth daily. 30 tablet 11  . digoxin (LANOXIN) 0.125 MG tablet Take 0.0625 mg by mouth every other day.    . diphenhydrAMINE (BENADRYL) 25 mg capsule Take 50 mg by mouth daily as needed for allergies.     . fluticasone (FLONASE) 50 MCG/ACT nasal spray Place 1 spray into both nostrils daily. 16 g 2  . furosemide (LASIX) 40 MG tablet Take 80 mg by mouth daily. 80 mg (2 tablets) in the am. If weight increases more than 3 lbs day or 5 lbs week take extra 40 mg (1 tablet)    . glucose blood (ACCU-CHEK AVIVA PLUS) test strip Check blood glucose twice a day for type 2 diabetes (E11.9). 100 each 12  . insulin glargine (LANTUS) 100 UNIT/ML injection Inject 0.3 mLs (30 Units total) into the skin at bedtime. 10 mL 11  . insulin lispro (HUMALOG) 100 UNIT/ML injection Inject 6 Units into the skin 3 (three) times daily as needed for high blood sugar (over 150).     [name] losartan (COZAAR) 25 MG tablet Take 0.5 tablets (12.5 mg total) by mouth daily. 15 tablet 3  . milrinone (PRIMACOR) 20 MG/100ML SOLN infusion Inject 20.425 mcg/min into the vein continuous. 100 mL 0  . nitroGLYCERIN (NITROSTAT) 0.4 MG SL tablet Place 1 tablet (0.4 mg total) under the tongue every 5 (five) minutes as needed. For chest pain. 25 tablet 11  . potassium chloride SA (K-DUR,KLOR-CON) 20 MEQ tablet Take 20 mEq by mouth daily.    . [DISCONTINUED] sitaGLIPtin (JANUVIA) 25 MG tablet Take 1 tablet (25 mg total) by mouth daily. 30 tablet 1   No current facility-administered medications for this  encounter.    Filed Vitals:   02/15/14 1343  BP: 101/56  Pulse: 68  Resp: 18  Weight: 189 lb 4 oz (85.843 kg)  SpO2: 98%   PHYSICAL EXAM: General:  Well appearing. No resp difficulty .  HEENT: normal Neck: supple. JVP 8   Carotids 2+ bilaterally; no bruits. No lymphadenopathy or thryomegaly appreciated. Cor: PMI normal. Regular rate & rhythm. 2/6 SEM LSB 2/6 MR  no s3  R upper  chest tunneled catheter that is tender to touch and has slight yellowish drainage Lungs: clear Abdomen: obese,soft, nontender, mildly distended. No hepatosplenomegaly. No bruits or masses. Good bowel sounds. Extremities: no cyanosis, clubbing, rash, edema. Neuro: alert & orientedx3, cranial nerves grossly intact. Moves all 4 extremities w/o difficulty. Affect pleasant.   ASSESSMENT & PLAN:  1. Chronic Systolic Heart Failure: Ischemic cardiomyopathy s/p CRT-D, EF 15% (4/15). - NYHA III symptoms and volume status slightly elevated. She has been taking lasix 40 mg BID d/t pharmacy wouldn't fill for higher dose without new script. Sent new sript and asked her to take an extra 40 mg tonight and then increase to 80 mg q am and 40 mg q pm.  - Co-ox checked and was 82%. Continue milrinone 0.25 mcg.  - Continue HF medications at current doses.  - From reading Care Everywhere appears patient is not currently listed at [facility] and they are continuing to follow her for possible tx. They are concerned with size and blood type that she will not get a tx. before needing a LVAD. Continue to follow closely and if deteriorates has been worked up thoroughly for LVAD at our center.  - Continue losartan and digoxin at current doses. Spironolactone d/cd d/t rise in creatinine.  - Reinforced the need and importance of daily weights, a low sodium diet, and fluid restriction (less than 2 L a day). Instructed to call the HF clinic if weight increases more than 3 lbs overnight or 5 lbs in a week.  2. CAD: No chest pain.  Continue ASA and  statin.  3. Mitral regurgitation: S/P  mitral valve annuloplasty with CABG. Moderate to severe MR on echo in 4/15. TEE (06/2013) mitral regurg appears severe, anatomy of repaired valve does not look suitable for MV clipping and would be high risk for MV replacement. Continue to follow.  4. CKD stage III- baseline Cr 1.5. Last creatinine 1.4. Continue to follow with weekly labs on milrinone.    6. NSVT- denies any palpitations. Continue amiodarone 200 mg daily.  7. PICC infection? - Patient is having tenderness at insertion site of tunneled catheter. WBC nl on labs and denies any fevers or chills. Around site appears irritated and having scant yellowish drainage. Would like to admit today for discontinuation and culture of tip with new line insertion, however patient would like to hold off until tomorrow. She has an impt phone call tomorrow and will come in after. Will start doxy 100 mg BID currently.      Junie Bame B NP-C 02/15/2014

## 2014-02-15 NOTE — Telephone Encounter (Signed)
Pt aware.

## 2014-02-16 ENCOUNTER — Inpatient Hospital Stay (HOSPITAL_COMMUNITY)
Admission: AD | Admit: 2014-02-16 | Discharge: 2014-02-20 | DRG: 315 | Disposition: A | Discharge status: Home Health | Payer: Medicare Other | Source: Ambulatory Visit | Attending: Internal Medicine | Admitting: Internal Medicine

## 2014-02-16 ENCOUNTER — Encounter (HOSPITAL_COMMUNITY): Payer: Self-pay | Admitting: Anesthesiology

## 2014-02-16 DIAGNOSIS — F1910 Other psychoactive substance abuse, uncomplicated: Secondary | ICD-10-CM | POA: Diagnosis present

## 2014-02-16 DIAGNOSIS — E119 Type 2 diabetes mellitus without complications: Secondary | ICD-10-CM | POA: Diagnosis present

## 2014-02-16 DIAGNOSIS — Z95810 Presence of automatic (implantable) cardiac defibrillator: Secondary | ICD-10-CM

## 2014-02-16 DIAGNOSIS — R229 Localized swelling, mass and lump, unspecified: Secondary | ICD-10-CM

## 2014-02-16 DIAGNOSIS — I255 Ischemic cardiomyopathy: Secondary | ICD-10-CM | POA: Diagnosis present

## 2014-02-16 DIAGNOSIS — I2510 Atherosclerotic heart disease of native coronary artery without angina pectoris: Secondary | ICD-10-CM | POA: Diagnosis present

## 2014-02-16 DIAGNOSIS — Z951 Presence of aortocoronary bypass graft: Secondary | ICD-10-CM

## 2014-02-16 DIAGNOSIS — I509 Heart failure, unspecified: Secondary | ICD-10-CM | POA: Diagnosis not present

## 2014-02-16 DIAGNOSIS — Z7982 Long term (current) use of aspirin: Secondary | ICD-10-CM | POA: Diagnosis not present

## 2014-02-16 DIAGNOSIS — N183 Chronic kidney disease, stage 3 unspecified: Secondary | ICD-10-CM | POA: Diagnosis present

## 2014-02-16 DIAGNOSIS — I5022 Chronic systolic (congestive) heart failure: Secondary | ICD-10-CM | POA: Diagnosis not present

## 2014-02-16 DIAGNOSIS — B999 Unspecified infectious disease: Secondary | ICD-10-CM

## 2014-02-16 DIAGNOSIS — T80219D Unspecified infection due to central venous catheter, subsequent encounter: Secondary | ICD-10-CM | POA: Diagnosis not present

## 2014-02-16 DIAGNOSIS — Z87891 Personal history of nicotine dependence: Secondary | ICD-10-CM

## 2014-02-16 DIAGNOSIS — Z8249 Family history of ischemic heart disease and other diseases of the circulatory system: Secondary | ICD-10-CM | POA: Diagnosis not present

## 2014-02-16 DIAGNOSIS — I129 Hypertensive chronic kidney disease with stage 1 through stage 4 chronic kidney disease, or unspecified chronic kidney disease: Secondary | ICD-10-CM | POA: Diagnosis present

## 2014-02-16 DIAGNOSIS — Z452 Encounter for adjustment and management of vascular access device: Secondary | ICD-10-CM | POA: Diagnosis not present

## 2014-02-16 DIAGNOSIS — Z794 Long term (current) use of insulin: Secondary | ICD-10-CM

## 2014-02-16 DIAGNOSIS — I447 Left bundle-branch block, unspecified: Secondary | ICD-10-CM | POA: Diagnosis present

## 2014-02-16 DIAGNOSIS — T80219A Unspecified infection due to central venous catheter, initial encounter: Secondary | ICD-10-CM | POA: Diagnosis not present

## 2014-02-16 DIAGNOSIS — I739 Peripheral vascular disease, unspecified: Secondary | ICD-10-CM | POA: Diagnosis present

## 2014-02-16 DIAGNOSIS — R222 Localized swelling, mass and lump, trunk: Secondary | ICD-10-CM | POA: Diagnosis not present

## 2014-02-16 HISTORY — DX: Peripheral vascular disease, unspecified: I73.9

## 2014-02-16 HISTORY — DX: Benign lipomatous neoplasm, unspecified: D17.9

## 2014-02-16 HISTORY — DX: Unspecified infection due to central venous catheter, initial encounter: T80.219A

## 2014-02-16 HISTORY — DX: Presence of cardiac pacemaker: Z95.0

## 2014-02-16 HISTORY — DX: Presence of automatic (implantable) cardiac defibrillator: Z95.810

## 2014-02-16 HISTORY — DX: Chronic kidney disease, stage 3 unspecified: N18.30

## 2014-02-16 HISTORY — DX: Chronic kidney disease, stage 3 (moderate): N18.3

## 2014-02-16 LAB — CBC WITH DIFFERENTIAL/PLATELET
BASOS ABS: 0 10*3/uL (ref 0.0–0.1)
Basophils Relative: 1 % (ref 0–1)
Eosinophils Absolute: 0.1 10*3/uL (ref 0.0–0.7)
Eosinophils Relative: 3 % (ref 0–5)
HCT: 33 % — ABNORMAL LOW (ref 36.0–46.0)
HEMOGLOBIN: 10.4 g/dL — AB (ref 12.0–15.0)
Lymphocytes Relative: 30 % (ref 12–46)
Lymphs Abs: 1.4 10*3/uL (ref 0.7–4.0)
MCH: 26.9 pg (ref 26.0–34.0)
MCHC: 31.5 g/dL (ref 30.0–36.0)
MCV: 85.5 fL (ref 78.0–100.0)
Monocytes Absolute: 0.3 10*3/uL (ref 0.1–1.0)
Monocytes Relative: 6 % (ref 3–12)
NEUTROS ABS: 2.8 10*3/uL (ref 1.7–7.7)
NEUTROS PCT: 60 % (ref 43–77)
PLATELETS: 140 10*3/uL — AB (ref 150–400)
RBC: 3.86 MIL/uL — ABNORMAL LOW (ref 3.87–5.11)
RDW: 17.3 % — AB (ref 11.5–15.5)
WBC: 4.6 10*3/uL (ref 4.0–10.5)

## 2014-02-16 LAB — COMPREHENSIVE METABOLIC PANEL
ALT: 58 U/L — AB (ref 0–35)
ANION GAP: 15 (ref 5–15)
AST: 34 U/L (ref 0–37)
Albumin: 3.9 g/dL (ref 3.5–5.2)
Alkaline Phosphatase: 122 U/L — ABNORMAL HIGH (ref 39–117)
BUN: 38 mg/dL — ABNORMAL HIGH (ref 6–23)
CALCIUM: 9.4 mg/dL (ref 8.4–10.5)
CO2: 25 mEq/L (ref 19–32)
CREATININE: 1.55 mg/dL — AB (ref 0.50–1.10)
Chloride: 102 mEq/L (ref 96–112)
GFR, EST AFRICAN AMERICAN: 42 mL/min — AB (ref >90)
GFR, EST NON AFRICAN AMERICAN: 36 mL/min — AB (ref >90)
GLUCOSE: 135 mg/dL — AB (ref 70–99)
Potassium: 3.7 mEq/L (ref 3.7–5.3)
Sodium: 142 mEq/L (ref 137–147)
TOTAL PROTEIN: 7.6 g/dL (ref 6.0–8.3)
Total Bilirubin: 0.3 mg/dL (ref 0.3–1.2)

## 2014-02-16 LAB — PRO B NATRIURETIC PEPTIDE: PRO B NATRI PEPTIDE: 1033 pg/mL — AB (ref 0–125)

## 2014-02-16 LAB — GLUCOSE, CAPILLARY
Glucose-Capillary: 127 mg/dL — ABNORMAL HIGH (ref 70–99)
Glucose-Capillary: 139 mg/dL — ABNORMAL HIGH (ref 70–99)

## 2014-02-16 LAB — CARBOXYHEMOGLOBIN
Carboxyhemoglobin: 0.9 % (ref 0.5–1.5)
METHEMOGLOBIN: 0.8 % (ref 0.0–1.5)
O2 Saturation: 59.8 %
TOTAL HEMOGLOBIN: 8.3 g/dL — AB (ref 12.0–16.0)

## 2014-02-16 LAB — MRSA PCR SCREENING: MRSA by PCR: NEGATIVE

## 2014-02-16 LAB — DIGOXIN LEVEL

## 2014-02-16 LAB — MAGNESIUM: MAGNESIUM: 2.1 mg/dL (ref 1.5–2.5)

## 2014-02-16 MED ORDER — LOSARTAN POTASSIUM 25 MG PO TABS
12.5000 mg | ORAL_TABLET | Freq: Every day | ORAL | Status: DC
  Administered 2014-02-17 – 2014-02-20 (×4): 12.5 mg via ORAL
  Filled 2014-02-16 (×4): qty 0.5

## 2014-02-16 MED ORDER — ACETAMINOPHEN 325 MG PO TABS
650.0000 mg | ORAL_TABLET | ORAL | Status: DC | PRN
  Administered 2014-02-17 – 2014-02-20 (×5): 650 mg via ORAL
  Filled 2014-02-16 (×5): qty 2

## 2014-02-16 MED ORDER — FUROSEMIDE 80 MG PO TABS
80.0000 mg | ORAL_TABLET | Freq: Every morning | ORAL | Status: DC
  Administered 2014-02-17 – 2014-02-20 (×4): 80 mg via ORAL
  Filled 2014-02-16 (×4): qty 1

## 2014-02-16 MED ORDER — POTASSIUM CHLORIDE CRYS ER 20 MEQ PO TBCR
20.0000 meq | EXTENDED_RELEASE_TABLET | Freq: Every day | ORAL | Status: DC
Start: 2014-02-17 — End: 2014-02-20
  Administered 2014-02-17 – 2014-02-20 (×4): 20 meq via ORAL
  Filled 2014-02-16 (×5): qty 1

## 2014-02-16 MED ORDER — ASPIRIN EC 81 MG PO TBEC: 81.0000 mg | DELAYED_RELEASE_TABLET | Freq: Every day | ORAL | Status: DC

## 2014-02-16 MED ORDER — INSULIN ASPART 100 UNIT/ML ~~LOC~~ SOLN
0.0000 [IU] | Freq: Three times a day (TID) | SUBCUTANEOUS | Status: DC
  Administered 2014-02-17 – 2014-02-18 (×3): 2 [IU] via SUBCUTANEOUS

## 2014-02-16 MED ORDER — SODIUM CHLORIDE 0.9 % IJ SOLN
3.0000 mL | Freq: Two times a day (BID) | INTRAMUSCULAR | Status: DC
  Administered 2014-02-16 – 2014-02-18 (×3): 3 mL via INTRAVENOUS

## 2014-02-16 MED ORDER — INSULIN GLARGINE 100 UNIT/ML ~~LOC~~ SOLN
30.0000 [IU] | Freq: Every day | SUBCUTANEOUS | Status: DC
  Administered 2014-02-16 – 2014-02-19 (×4): 30 [IU] via SUBCUTANEOUS
  Filled 2014-02-16 (×5): qty 0.3

## 2014-02-16 MED ORDER — SODIUM CHLORIDE 0.9 % IJ SOLN: 3.0000 mL | INTRAMUSCULAR | Status: DC | PRN

## 2014-02-16 MED ORDER — AMIODARONE HCL 200 MG PO TABS
200.0000 mg | ORAL_TABLET | Freq: Every day | ORAL | Status: DC
  Administered 2014-02-17 – 2014-02-20 (×4): 200 mg via ORAL
  Filled 2014-02-16 (×5): qty 1

## 2014-02-16 MED ORDER — ONDANSETRON HCL 4 MG/2ML IJ SOLN: 4.0000 mg | Freq: Four times a day (QID) | INTRAMUSCULAR | Status: DC | PRN

## 2014-02-16 MED ORDER — SODIUM CHLORIDE 0.9 % IV SOLN
250.0000 mL | INTRAVENOUS | Status: DC | PRN
  Administered 2014-02-19: 250 mL via INTRAVENOUS

## 2014-02-16 MED ORDER — ASPIRIN EC 81 MG PO TBEC
81.0000 mg | DELAYED_RELEASE_TABLET | Freq: Every day | ORAL | Status: DC
  Administered 2014-02-17 – 2014-02-20 (×4): 81 mg via ORAL
  Filled 2014-02-16 (×4): qty 1

## 2014-02-16 MED ORDER — DIGOXIN 125 MCG PO TABS
0.1250 mg | ORAL_TABLET | ORAL | Status: DC
  Administered 2014-02-17: 0.125 mg via ORAL
  Filled 2014-02-16 (×2): qty 1

## 2014-02-16 MED ORDER — VANCOMYCIN HCL IN DEXTROSE 1-5 GM/200ML-% IV SOLN
1000.0000 mg | Freq: Once | INTRAVENOUS | Status: AC
  Administered 2014-02-16: 1000 mg via INTRAVENOUS
  Filled 2014-02-16: qty 200

## 2014-02-16 MED ORDER — VANCOMYCIN HCL IN DEXTROSE 750-5 MG/150ML-% IV SOLN
750.0000 mg | Freq: Two times a day (BID) | INTRAVENOUS | Status: DC
  Administered 2014-02-17 – 2014-02-19 (×6): 750 mg via INTRAVENOUS
  Filled 2014-02-16 (×8): qty 150

## 2014-02-16 MED ORDER — MILRINONE IN DEXTROSE 20 MG/100ML IV SOLN
0.2500 ug/kg/min | INTRAVENOUS | Status: DC
  Administered 2014-02-16 – 2014-02-20 (×6): 0.25 ug/kg/min via INTRAVENOUS
  Filled 2014-02-16 (×7): qty 100

## 2014-02-16 MED ORDER — ATORVASTATIN CALCIUM 80 MG PO TABS
80.0000 mg | ORAL_TABLET | Freq: Every day | ORAL | Status: DC
  Administered 2014-02-17 – 2014-02-19 (×3): 80 mg via ORAL
  Filled 2014-02-16 (×6): qty 1

## 2014-02-16 MED ORDER — FUROSEMIDE 40 MG PO TABS
40.0000 mg | ORAL_TABLET | Freq: Every day | ORAL | Status: DC
  Administered 2014-02-17 – 2014-02-19 (×3): 40 mg via ORAL
  Filled 2014-02-16 (×6): qty 1

## 2014-02-16 NOTE — Consult Note (Addendum)
ANTIBIOTIC CONSULT NOTE - INITIAL  Pharmacy Consult for Vancomycin Indication: PICC line infection  No Known Allergies  Patient Measurements: Weight: 188 lb 0.8 oz (85.3 kg)   Vital Signs: Temp: 98.2 F (36.8 C) (12/17 1436) Temp Source: Oral (12/17 1436) BP: 113/93 mmHg (12/17 1436) Pulse Rate: 65 (12/17 1436) Intake/Output from previous day:   Intake/Output from this shift:    Microbiology: No results found for this or any previous visit (from the past 720 hour(s)).  Medical History: Past Medical History  Diagnosis Date  . Coronary artery disease     a. s/p CABG x 5 with MV annuloplasty 2007   . Diabetes mellitus   . Hypertension   . Chronic systolic heart failure     a. ICM b. ECHO (06/2013): EF 15%, severe MR (06/2013) c. RHC (10/2013): RA 5, RV 23/2/5, PA 25/6 (14), PCWP 12, Fick CO/CI: 3.2/1.7, PVR 0.7 WU, PA 45%, 47% and 55% (with levophed 5), vagal response during cath   . Polysubstance abuse     history of  (cocaine, tobacco and ETOH)  . V-tach   . Ischemic cardiomyopathy     a. s/p ICD b. LHC (10/04/13): 1. Severe native CAD with all bypass grafts patent    . Implantable defibrillator   medtronic   . LBBB (left bundle branch block)   . Presence of permanent cardiac pacemaker   . AICD (automatic cardioverter/defibrillator) present   . CHF (congestive heart failure)    Assessment: [name] on home milrinone being admitted from the HF clinic for possible PICC line infection. Was started on po doxycycline yesterday and now will broaden to IV vancomycin. She has CKD - admit labs pending.  Goal of Therapy:  Vancomycin trough level 10-15 mcg/ml  Plan:  1) Vancomycin 1g IV x 1 now 2) Follow up labs for maintenance dosing   Deboraha Sprang 02/16/2014,3:07 PM   Addendum:  Labs reviewed - current SCr 1.55/ estimated CrCl~42mL/min (CKD III). Wt 85.3 kg. Patient is afebrile. WBC is within normal limits. Blood cultures pending. Patient received 1g at 1600 PM.    Plan:  Vancomycin 750 mg IV every 12 hours.  Watch renal function and adjust as needed. Follow up culture results and ability to narrow.   Sloan Leiter, PharmD, BCPS Clinical Pharmacist [phone] 02/16/2014 6:23 PM

## 2014-02-16 NOTE — Care Management Note (Addendum)
    Page 1 of 2   02/20/2014     4:16:27 PM CARE MANAGEMENT NOTE 02/20/2014  Patient:  [name], [name]   Account Number:  [id number]  Date Initiated:  02/16/2014  Documentation initiated by:  Elissa Hefty  Subjective/Objective Assessment:   adm w heart failure & poss picc infect     Action/Plan:   lives at home, act w [facility] homecare, followed in heart failure clinic, pcp dr Rana Snare moding   Anticipated DC Date:  02/20/2014   Anticipated DC Plan:  [place]         [facility] Choice  Resumption Of Svcs/PTA Provider   Choice offered to / List presented to:  [name]   DME arranged  IV PUMP/EQUIPMENT      DME agency  [place] arranged  HH-1 RN  [place].   Status of service:  Completed, signed off Medicare Important Message given?  YES (If response is "NO", the following Medicare IM given date fields will be blank) Date Medicare IM given:  02/20/2014 Medicare IM given by:  Ranyia Witting Date Additional Medicare IM given:   Additional Medicare IM given by:    Discharge Disposition:  [place]  Per UR Regulation:  Reviewed for med. necessity/level of care/duration of stay  If discussed at [place] of Stay Meetings, dates discussed:    Comments:  02/20/14 Ellan Lambert, RN, BSN [phone] Pt for PICC replacement today, then dc home with [facility] services as prior to admission.  Spoke with Pam, IV infusion coordinator for [facility]:  she states pt's milrinone was delivered to pt's home...son plans to bring it to the hospital around 4pm--[facility] will then come here to hook pt up to milrinone pump.

## 2014-02-16 NOTE — Procedures (Signed)
Interventional Radiology Procedure Note  Procedure: Bedside removal of right IJ tunneled PICC.  Line was placed by IR on 01/25/14.  Removed over concern for infection at the catheter skin exit site.   Cultures: The catheter tip was cut and sent for culture.  The wound was also cultured.  Complications: None Recommendations: - Follow cultures - Continue abx - Once blood cx are negative and pt clinically asymptomatic, a new tunneled PICC can be placed if pt continues to need milrinone infusion.   Signed,  Criselda Peaches, MD

## 2014-02-16 NOTE — Progress Notes (Signed)
[facility]  Patient Status:  Active pt with [facility] prior to this readmission.  [facility] is providing the following services: HHRN and Home Infusion Pharm[facility] for home Milrinone therapy. We will follow her and support transition home when ordered.   If patient discharges after hours, please call [phone].   Larry Sierras 02/16/2014, 5:10 PM

## 2014-02-16 NOTE — H&P (Signed)
Advanced Heart Failure Team History and Physical Note   Primary Physician: Dr. Nicoletta Dress (Internal Medicine) Primary Cardiologist:  Dr. Haroldine Laws  Reason for Admission: PICC line infection?   HPI:    58 year old female with history of HTN, DM2, past polysubstance abuse (ETOH, tobacco, cocaine), CAD s/p CABG x5 with MV annuloplasty (2007), ICM s/p ICD (CRT upgrade 62/13), chronic systolic HF EF 08%, severe MR, CKD stage III and PAD. Blood type O+  On 9/25 underwent RHC for low output symptoms. Hemodynamic borderline. Swan left in and she was observed. Cardiac output dropped while in hospital so started on milrinone. Discharged home. Underwent CRT-D upgrade on 12/02/13.  Has been followed closely in the HF clinic for work-up of advanced therapies. She is also being seen by [facility] for possible tx. Presented to clinic yesterday for minimal SOB and tenderness of tunneled PICC. Tender to touch, erythema and scant yellowish drainage. Wanted to admit yesterday for discontinuation of PICC and culture of tip, however patient wanted to wait until today. Started on doxycycline 100 mg BID. Weight stable and told to take extra lasix yesterday and increase lasix to 80 mg q am and 40 mg q pm. Co-ox at visit   Review of Systems: [y] = yes, [ ]  = no   General: Weight gain [ ] ; Weight loss [ ] ; Anorexia [ ] ; Fatigue [ ] ; Fever [ ] ; Chills [ ] ; Weakness [Y ]  Cardiac: Chest pain/pressure Aqua.Slicker ]; Resting SOB Aqua.Slicker ]; Exertional SOB [Y ]; Cardell Peach ]; Pedal Edema [ N]; Palpitations [ N]; Syncope [ ] ; Presyncope [ ] ; Paroxysmal nocturnal dyspnea[N ]  Pulmonary: Cough [ ] ; Wheezing[ ] ; Hemoptysis[ ] ; Sputum [ ] ; Snoring [ ]   GI: Vomiting[ ] ; Dysphagia[ ] ; Melena[N ]; Hematochezia [ ] ; Heartburn[ ] ; Abdominal pain [ ] ; Constipation [ ] ; Diarrhea [ ] ; BRBPR [ ]   GU: Hematuria[ ] ; Dysuria [ ] ; Nocturia[ ]   Vascular: Pain in legs with walking [ ] ; Pain in feet with lying flat [ ] ; Non-healing sores [ ] ; Stroke [ ] ; TIA [ ] ; Slurred  speech [ ] ;  Neuro: Headaches[ ] ; Vertigo[ ] ; Seizures[ ] ; Paresthesias[ ] ;Blurred vision [ ] ; Diplopia [ ] ; Vision changes [ ]   Ortho/Skin: Arthritis [ ] ; Joint pain [ ] ; Muscle pain [ ] ; Joint swelling [ ] ; Back Pain [ ] ; Rash [ ]   Psych: Depression[ ] ; Anxiety[Y ]  Heme: Bleeding problems [ ] ; Clotting disorders [ ] ; Anemia [ ]   Endocrine: Diabetes [ ] ; Thyroid dysfunction[ ]   Home Medications Prior to Admission medications   Medication Sig Start Date End Date Taking? Authorizing Provider  allopurinol (ZYLOPRIM) 100 MG tablet Take 1-2 tablets (100-200 mg total) by mouth 2 (two) times daily. Takes 200 mg in the morning and 100 mg in the evening 01/05/14   Jolaine Artist, MD  amiodarone (PACERONE) 200 MG tablet Take 1 tablet (200 mg total) by mouth daily. 12/22/13   Rande Brunt, NP  aspirin EC 81 MG tablet Take 81 mg by mouth daily. 09/30/13   Deboraha Sprang, MD  atorvastatin (LIPITOR) 80 MG tablet Take 1 tablet (80 mg total) by mouth daily. 07/11/13   Jolaine Artist, MD  digoxin (LANOXIN) 0.125 MG tablet Take 0.0625 mg by mouth every other day. 11/02/13   Jolaine Artist, MD  diphenhydrAMINE (BENADRYL) 25 mg capsule Take 50 mg by mouth daily as needed for allergies.     Historical Provider, MD  doxycycline (DORYX) 100 MG EC  tablet Take 1 tablet (100 mg total) by mouth 2 (two) times daily. 02/15/14   Rande Brunt, NP  fluticasone (FLONASE) 50 MCG/ACT nasal spray Place 1 spray into both nostrils daily. 10/28/13 10/28/14  Arman Filter, MD  furosemide (LASIX) 40 MG tablet 80 mg (2 tablets) in the am and 40 mg (1 tablet) in pm. 02/15/14   Rande Brunt, NP  glucose blood (ACCU-CHEK AVIVA PLUS) test strip Check blood glucose twice a day for type 2 diabetes (E11.9). 02/15/14   Arman Filter, MD  insulin glargine (LANTUS) 100 UNIT/ML injection Inject 0.3 mLs (30 Units total) into the skin at bedtime. 10/28/13   Arman Filter, MD  insulin lispro (HUMALOG) 100 UNIT/ML injection Inject 6  Units into the skin 3 (three) times daily as needed for high blood sugar (over 150).  10/28/13   Arman Filter, MD  losartan (COZAAR) 25 MG tablet Take 0.5 tablets (12.5 mg total) by mouth daily. 12/22/13   Rande Brunt, NP  milrinone [facility]) 20 MG/100ML SOLN infusion Inject 20.425 mcg/min into the vein continuous. 11/28/13   Rande Brunt, NP  nitroGLYCERIN (NITROSTAT) 0.4 MG SL tablet Place 1 tablet (0.4 mg total) under the tongue every 5 (five) minutes as needed. For chest pain. 06/02/12   Deboraha Sprang, MD  potassium chloride SA (K-DUR,KLOR-CON) 20 MEQ tablet Take 20 mEq by mouth daily. 11/02/13   Jolaine Artist, MD    Past Medical History: Past Medical History  Diagnosis Date  . Coronary artery disease     a. s/p CABG x 5 with MV annuloplasty 2007   . Diabetes mellitus   . Hypertension   . Chronic systolic heart failure     a. ICM b. ECHO (06/2013): EF 15%, severe MR (06/2013) c. RHC (10/2013): RA 5, RV 23/2/5, PA 25/6 (14), PCWP 12, Fick CO/CI: 3.2/1.7, PVR 0.7 WU, PA 45%, 47% and 55% (with levophed 5), vagal response during cath   . Polysubstance abuse     history of  (cocaine, tobacco and ETOH)  . V-tach   . Ischemic cardiomyopathy     a. s/p ICD b. LHC (10/04/13): 1. Severe native CAD with all bypass grafts patent    . Implantable defibrillator   medtronic   . LBBB (left bundle branch block)     Past Surgical History: Past Surgical History  Procedure Laterality Date  . Coronary artery bypass graft    . Cardiac catheterization    . Cardiac defibrillator placement      2011, Followed By Dr. Caryl Comes  . Tee without cardioversion N/A 06/02/2013    Procedure: TRANSESOPHAGEAL ECHOCARDIOGRAM (TEE);  Surgeon: Larey Dresser, MD;  Location: [place];  Service: Cardiovascular;  Laterality: N/A;  . Bi-ventricular implantable cardioverter defibrillator upgrade  12/02/2013    MD[facility] [place] CRTD upgrade by Dr Caryl Comes  . Left and right heart catheterization with coronary/graft angiogram N/A  03/17/2011    Procedure: LEFT AND RIGHT HEART CATHETERIZATION WITH Beatrix Fetters;  Surgeon: Jolaine Artist, MD;  Location: [facility] CATH LAB;  Service: Cardiovascular;  Laterality: N/A;  . Right heart catheterization N/A 05/23/2013    Procedure: RIGHT HEART CATH;  Surgeon: Jolaine Artist, MD;  Location: [facility] CATH LAB;  Service: Cardiovascular;  Laterality: N/A;  . Left and right heart catheterization with coronary/graft angiogram N/A 10/04/2013    Procedure: LEFT AND RIGHT HEART CATHETERIZATION WITH Beatrix Fetters;  Surgeon: Jolaine Artist, MD;  Location: [facility] CATH LAB;  Service: Cardiovascular;  Laterality:  N/A;  . Right heart catheterization N/A 11/01/2013    Procedure: RIGHT HEART CATH;  Surgeon: Jolaine Artist, MD;  Location[facility] CATH LAB;  Service: Cardiovascular;  Laterality: N/A;  . Right heart catheterization N/A 11/25/2013    Procedure: RIGHT HEART CATH;  Surgeon: Jolaine Artist, MD;  Location: [facility] CATH LAB;  Service: Cardiovascular;  Laterality: N/A;  . Bi-ventricular implantable cardioverter defibrillator upgrade N/A 12/02/2013    Procedure: BI-VENTRICULAR IMPLANTABLE CARDIOVERTER DEFIBRILLATOR UPGRADE;  Surgeon: Deboraha Sprang, MD;  Location: [facility] CATH LAB;  Service: Cardiovascular;  Laterality: N/A;    Family History: Family History  Problem Relation Age of Onset  . Heart attack Mother     Died age 58  . Diabetes Maternal Grandmother   . Heart attack Cousin   . Heart attack Maternal Uncle     Social History: History   Social History  . Marital Status: Widowed    Spouse Name: N/A    Number of Children: N/A  . Years of Education: N/A   Occupational History  . disable    Social History Main Topics  . Smoking status: Former Smoker    Quit date: 03/03/2011  . Smokeless tobacco: Not on file  . Alcohol Use: Yes     Comment: Had some drinks New Years, describes social drinkingC  . Drug Use: No     Comment: Has history of cocaine use, denies recent   . Sexual Activity: Not Currently   Other Topics Concern  . Not on file   Social History Narrative    Allergies:  No Known Allergies  Objective:    Vital Signs:   Pulse Rate:  [68] 68 (12/16 1343) Resp:  [18] 18 (12/16 1343) BP: (101)/(56) 101/56 mmHg (12/16 1343) SpO2:  [98 %] 98 % (12/16 1343) Weight:  [189 lb 4 oz (85.843 kg)] 189 lb 4 oz (85.843 kg) (12/16 1343)   There were no vitals filed for this visit.  Physical Exam: General: Well appearing. No resp difficulty .  HEENT: normal Neck: supple. JVP 8 Carotids 2+ bilaterally; no bruits. No lymphadenopathy or thryomegaly appreciated. Cor: PMI normal. Regular rate & rhythm. 2/6 SEM LSB 2/6 MR no s3 R upper chest tunneled catheter that is tender to touch and has slight yellowish drainage Lungs: clear Abdomen: obese,soft, nontender, mildly distended. No hepatosplenomegaly. No bruits or masses. Good bowel sounds. Extremities: no cyanosis, clubbing, rash, edema. Neuro: alert & orientedx3, cranial nerves grossly intact. Moves all 4 extremities w/o difficulty. Affect pleasant.  Telemetry: SR  Labs: Basic Metabolic Panel: No results for input(s): NA, K, CL, CO2, GLUCOSE, BUN, CREATININE, CALCIUM, MG, PHOS in the last 168 hours.  Liver Function Tests: No results for input(s): AST, ALT, ALKPHOS, BILITOT, PROT, ALBUMIN in the last 168 hours. No results for input(s): LIPASE, AMYLASE in the last 168 hours. No results for input(s): AMMONIA in the last 168 hours.  CBC: No results for input(s): WBC, NEUTROABS, HGB, HCT, MCV, PLT in the last 168 hours.  Cardiac Enzymes: No results for input(s): CKTOTAL, CKMB, CKMBINDEX, TROPONINI in the last 168 hours.  BNP: BNP (last 3 results)  Recent Labs  06/07/13 1041 11/24/13 1236  PROBNP 899.80* 1349.0*    CBG: No results for input(s): GLUCAP in the last 168 hours.  Coagulation Studies: No results for input(s): LABPROT, INR in the last 72 hours.   Imaging:  No  results found.      Assessment:   1) PICC infection? 2) Chronic systolic HF -  on milrinone 0.25 mcg 3) ICM s/p ICD 4) CKD stage III 5) CAD s/p CABG  Plan/Discussion:    [name] is well known to the HF team and has chronic systolic HF d/t ICM. She has been worked up for advanced therapies and currently is being considered for a tx at [facility]. She has been on milrinone 0.25 mcg at home and yesterday presented to clinic complaining of her tunneled catheter being tender and having drainage. We wanted to admit her yesterday to exchange the PICC and culture the tip however she did not want to come in until this afternoon.   Started on doxy 100 mg BID. Will place PICC and run milrinone thru it and exchange PICC. Culture tip. Will consult pharmacy for vancomycin and get cultures.  Last CBC nl. Will check CBC, BMET and pro-BNP  Very difficult situation patient's symptoms change from day to day and is NYHA IIIb-IV symptoms. Yesterday in clinic denied SOB and reported able to walk dog about 1 block without stopping but got SOB/fatgiued after doing something constant. Today reports DOE with minimal activity. Every time we have gotten co-ox's they have been good. May need to do cath again to assess hemodynamics and leave swan in to catch to see if dropping output. We do not have central access once her PICC gets removed until a new line gets inserted.   If she is found to have low output would need to have conversation with [facility] as to whether if increasing milrinone to 0.5 mcg if they think that she would be able to possibly get tx at status 1A time. She appears from [facility] to be worked up but is not actively listed and not sure what else needs to take place. If at all possible would favor tx over LVAD d/t the fact that LVAD will be second sternotomy, however if she needs advanced therapies and tx not an option currently then she has been worked up [employer] by our team and felt to be a good  candidate.    Length of Stay:  Rande Brunt NP-C 02/16/2014, 11:00 AM  Advanced Heart Failure Team Pager [phone] (M-F; 7a - 4p)  Please contact [facility] for night-coverage after hours (4p -7a ) and weekends on [email or web address]   Patient seen and examined with Junie Bame, NP. We discussed all aspects of the encounter. I agree with the assessment and plan as stated above. Two major issues include PICC line infection and ongoing severe HF. PICC has been removed. PICC tip sent for culture. Will get [facility] (although drawn after doxy started). Will start vancomycin. Prior to d/c will need to decide on need for RHC to reassess severity of HF. Would hold off on replacing PICC until [place] negative for 48 hours.   Benay Spice 6:27 PM

## 2014-02-17 ENCOUNTER — Inpatient Hospital Stay (HOSPITAL_COMMUNITY): Payer: Medicare Other

## 2014-02-17 DIAGNOSIS — N183 Chronic kidney disease, stage 3 (moderate): Secondary | ICD-10-CM

## 2014-02-17 LAB — BASIC METABOLIC PANEL
Anion gap: 12 (ref 5–15)
BUN: 35 mg/dL — ABNORMAL HIGH (ref 6–23)
CALCIUM: 9.4 mg/dL (ref 8.4–10.5)
CO2: 26 meq/L (ref 19–32)
Chloride: 105 mEq/L (ref 96–112)
Creatinine, Ser: 1.4 mg/dL — ABNORMAL HIGH (ref 0.50–1.10)
GFR calc Af Amer: 48 mL/min — ABNORMAL LOW (ref >90)
GFR, EST NON AFRICAN AMERICAN: 41 mL/min — AB (ref >90)
GLUCOSE: 91 mg/dL (ref 70–99)
Potassium: 3.5 mEq/L — ABNORMAL LOW (ref 3.7–5.3)
SODIUM: 143 meq/L (ref 137–147)

## 2014-02-17 LAB — GLUCOSE, CAPILLARY
GLUCOSE-CAPILLARY: 105 mg/dL — AB (ref 70–99)
GLUCOSE-CAPILLARY: 66 mg/dL — AB (ref 70–99)
Glucose-Capillary: 117 mg/dL — ABNORMAL HIGH (ref 70–99)
Glucose-Capillary: 136 mg/dL — ABNORMAL HIGH (ref 70–99)
Glucose-Capillary: 94 mg/dL (ref 70–99)

## 2014-02-17 MED ORDER — ENOXAPARIN SODIUM 40 MG/0.4ML ~~LOC~~ SOLN
40.0000 mg | SUBCUTANEOUS | Status: DC
  Administered 2014-02-17 – 2014-02-20 (×4): 40 mg via SUBCUTANEOUS
  Filled 2014-02-17 (×4): qty 0.4

## 2014-02-17 MED ORDER — POTASSIUM CHLORIDE CRYS ER 20 MEQ PO TBCR
40.0000 meq | EXTENDED_RELEASE_TABLET | Freq: Once | ORAL | Status: AC
  Administered 2014-02-17: 40 meq via ORAL
  Filled 2014-02-17: qty 2

## 2014-02-17 NOTE — Progress Notes (Signed)
Attempted to call report on pt. RN said she would call back shortly. [name]

## 2014-02-17 NOTE — Progress Notes (Signed)
Advanced Heart Failure Rounding Note   Subjective:   58 year old female with history of HTN, DM2, past polysubstance abuse (ETOH, tobacco, cocaine), CAD s/p CABG x5 with MV annuloplasty (2007), ICM s/p ICD (CRT upgrade 44/31), chronic systolic HF EF 54%, severe MR, CKD stage III and PAD. Blood type O+  On 9/25 underwent RHC for low output symptoms. Hemodynamic borderline. Swan left in and she was observed. Cardiac output dropped while in hospital so started on milrinone. Discharged home. Underwent CRT-D upgrade on 12/02/13.  Has been followed closely in the HF clinic for work-up of advanced therapies. She is also being seen by [facility] for possible tx.  Admitted for suspected infected tunneled catheter. Blood cultures obtained and negative so far. Tunneled catheter was removed.   Feeling ok. Denies SOB.   Objective:   Weight Range:  Vital Signs:   Temp:  [97.8 F (36.6 C)-98.2 F (36.8 C)] 98.1 F (36.7 C) (12/18 0700) Pulse Rate:  [50-71] 58 (12/18 0700) Resp:  [8-27] 13 (12/18 0700) BP: (67-113)/(37-93) 97/49 mmHg (12/18 0700) SpO2:  [83 %-99 %] 95 % (12/18 0700) Weight:  [183 lb 10.3 oz (83.3 kg)-188 lb 0.8 oz (85.3 kg)] 183 lb 10.3 oz (83.3 kg) (12/18 0414)    Weight change: Filed Weights   02/16/14 1436 02/16/14 1444 02/17/14 0414  Weight: 186 lb 4.6 oz (84.5 kg) 188 lb 0.8 oz (85.3 kg) 183 lb 10.3 oz (83.3 kg)    Intake/Output:   Intake/Output Summary (Last 24 hours) at 02/17/14 0737 Last data filed at 02/17/14 0700  Gross per 24 hour  Intake 414.61 ml  Output   1775 ml  Net -1360.39 ml     Physical Exam: General: Well appearing. No resp difficulty .  HEENT: normal Neck: supple. JVP 5-6 Carotids 2+ bilaterally; no bruits. No lymphadenopathy or thryomegaly appreciated. Cor: PMI normal. Regular rate & rhythm. 2/6 SEM LSB 2/6 MR no s3  Lungs: clear Abdomen: obese,soft, nontender, mildly distended. No hepatosplenomegaly. No bruits or masses. Good bowel  sounds. Extremities: no cyanosis, clubbing, rash, edema. Neuro: alert & orientedx3, cranial nerves grossly intact. Moves all 4 extremities w/o difficulty. Affect pleasant.  Telemetry: SR Labs: Basic Metabolic Panel:  Recent Labs Lab 02/16/14 1630 02/17/14 0240  NA 142 143  K 3.7 3.5*  CL 102 105  CO2 25 26  GLUCOSE 135* 91  BUN 38* 35*  CREATININE 1.55* 1.40*  CALCIUM 9.4 9.4  MG 2.1  --     Liver Function Tests:  Recent Labs Lab 02/16/14 1630  AST 34  ALT 58*  ALKPHOS 122*  BILITOT 0.3  PROT 7.6  ALBUMIN 3.9   No results for input(s): LIPASE, AMYLASE in the last 168 hours. No results for input(s): AMMONIA in the last 168 hours.  CBC:  Recent Labs Lab 02/16/14 1630  WBC 4.6  NEUTROABS 2.8  HGB 10.4*  HCT 33.0*  MCV 85.5  PLT 140*    Cardiac Enzymes: No results for input(s): CKTOTAL, CKMB, CKMBINDEX, TROPONINI in the last 168 hours.  BNP: BNP (last 3 results)  Recent Labs  06/07/13 1041 11/24/13 1236 02/16/14 1500  PROBNP 899.80* 1349.0* 1033.0*     Other results:  EKG:   Imaging:  No results found.   Medications:     Scheduled Medications: . amiodarone  200 mg Oral Daily  . aspirin EC  81 mg Oral Daily  . atorvastatin  80 mg Oral q1800  . digoxin  0.125 mg Oral QODAY  .  furosemide  40 mg Oral QAC supper  . furosemide  80 mg Oral q morning - 10a  . insulin aspart  0-15 Units Subcutaneous TID WC  . insulin glargine  30 Units Subcutaneous QHS  . losartan  12.5 mg Oral Daily  . potassium chloride  20 mEq Oral Daily  . sodium chloride  3 mL Intravenous Q12H  . vancomycin  750 mg Intravenous Q12H     Infusions: . milrinone 0.25 mcg/kg/min (02/17/14 0413)     PRN Medications:  sodium chloride, acetaminophen, ondansetron (ZOFRAN) IV, sodium chloride   Assessment:   1) PICC infection? 2) Chronic systolic HF - on milrinone 0.25 mcg 3) ICM s/p ICD 4) CKD stage III 5) CAD s/p CABG   Plan/Discussion:    Afebrile.  Tunnel catheter out. Blood cultures negative so far. On Vancomycin.   Continues on Milrinone 0.375 mcg via peripheral IV . Will need new picc after cultures back. Volume status stable. Continue lasix 80 mg in am and 40 mg in pm. Continue digoxin and losartan at current dose.    May need RHC prior to d/c   Length of Stay: 1   CLEGG,AMY NP-C  02/17/2014, 7:37 AM  Advanced Heart Failure Team Pager [phone] (M-F; 7a - 4p)  Please contact [facility] for night-coverage after hours (4p -7a ) and weekends on [email or web address]   Patient seen and examined with Darrick Grinder, NP. We discussed all aspects of the encounter. I agree with the assessment and plan as stated above.   Stable. Culture of cath tip remains negative. BCX x 2 drawn last night (after 2 doses of oral doxy) also NGTD. Would wait for cultures to be negative x 48hr. If so will replace PICC on Monday and send home on doxy. Continue vancomycin over the weekend. Co-ox suggestsadequate cardiac output. Can reassess co-ox on Monday when access back in. Continue home milrinone.   Jaidynn Balster,MD 7:58 AM

## 2014-02-17 NOTE — Progress Notes (Signed)
Attempted to call report at this time. RN able to take report..will try again. [name]

## 2014-02-17 NOTE — Progress Notes (Signed)
Report given to [facility] on [place] room 15. Reported PMX any current pertinent information. Any changes in patient condition. Asked nurse if she had any additional questions.

## 2014-02-18 ENCOUNTER — Inpatient Hospital Stay (HOSPITAL_COMMUNITY): Payer: Medicare Other

## 2014-02-18 DIAGNOSIS — T80219D Unspecified infection due to central venous catheter, subsequent encounter: Secondary | ICD-10-CM

## 2014-02-18 DIAGNOSIS — R229 Localized swelling, mass and lump, unspecified: Secondary | ICD-10-CM

## 2014-02-18 LAB — BASIC METABOLIC PANEL
Anion gap: 16 — ABNORMAL HIGH (ref 5–15)
BUN: 32 mg/dL — ABNORMAL HIGH (ref 6–23)
CALCIUM: 9.6 mg/dL (ref 8.4–10.5)
CO2: 23 mEq/L (ref 19–32)
CREATININE: 1.33 mg/dL — AB (ref 0.50–1.10)
Chloride: 99 mEq/L (ref 96–112)
GFR calc non Af Amer: 44 mL/min — ABNORMAL LOW (ref >90)
GFR, EST AFRICAN AMERICAN: 51 mL/min — AB (ref >90)
GLUCOSE: 155 mg/dL — AB (ref 70–99)
Potassium: 3.8 mEq/L (ref 3.7–5.3)
Sodium: 138 mEq/L (ref 137–147)

## 2014-02-18 LAB — GLUCOSE, CAPILLARY
GLUCOSE-CAPILLARY: 130 mg/dL — AB (ref 70–99)
GLUCOSE-CAPILLARY: 106 mg/dL — AB (ref 70–99)
Glucose-Capillary: 135 mg/dL — ABNORMAL HIGH (ref 70–99)
Glucose-Capillary: 86 mg/dL (ref 70–99)

## 2014-02-18 NOTE — Progress Notes (Signed)
DAILY PROGRESS NOTE  Subjective:  Feels well, denies shortness of breath. She pointed out a rubbery mass along the mid-thoracic spine that had not been noted before. On milrinone through the peripheral IV.  Objective:  Temp:  [97.9 F (36.6 C)-98.2 F (36.8 C)] 97.9 F (36.6 C) (12/19 0524) Pulse Rate:  [54-66] 66 (12/19 1011) Resp:  [18] 18 (12/19 0524) BP: (97-111)/(45-62) 101/54 mmHg (12/19 1011) SpO2:  [96 %-98 %] 96 % (12/19 0524) Weight:  [186 lb 3.2 oz (84.46 kg)] 186 lb 3.2 oz (84.46 kg) (12/19 0524) Weight change: 6.6 oz (0.187 kg)  Intake/Output from previous day: 12/18 0701 - 12/19 0700 In: 1495.7 [P.O.:1080; I.V.:115.7; IV Piggyback:300] Out: -   Intake/Output from this shift: Total I/O In: 240 [P.O.:240] Out: -   Medications: Current Facility-Administered Medications  Medication Dose Route Frequency Provider Last Rate Last Dose  . 0.9 %  sodium chloride infusion  250 mL Intravenous PRN Rande Brunt, NP      . acetaminophen (TYLENOL) tablet 650 mg  650 mg Oral Q4H PRN Rande Brunt, NP   650 mg at 02/17/14 1455  . amiodarone (PACERONE) tablet 200 mg  200 mg Oral Daily Rande Brunt, NP   200 mg at 02/18/14 1007  . aspirin EC tablet 81 mg  81 mg Oral Daily Rande Brunt, NP   81 mg at 02/18/14 1006  . atorvastatin (LIPITOR) tablet 80 mg  80 mg Oral q1800 Rande Brunt, NP   80 mg at 02/17/14 1801  . digoxin (LANOXIN) tablet 0.125 mg  0.125 mg Oral Beverly Gust Rande Brunt, NP   0.125 mg at 02/17/14 0903  . enoxaparin (LOVENOX) injection 40 mg  40 mg Subcutaneous Q24H Jolaine Artist, MD   40 mg at 02/18/14 1007  . furosemide (LASIX) tablet 40 mg  40 mg Oral QAC supper Rande Brunt, NP   40 mg at 02/17/14 1733  . furosemide (LASIX) tablet 80 mg  80 mg Oral q morning - 10a Rande Brunt, NP   80 mg at 02/18/14 1011  . insulin aspart (novoLOG) injection 0-15 Units  0-15 Units Subcutaneous TID WC Rande Brunt, NP   2 Units at 02/18/14 [id number]  . insulin  glargine (LANTUS) injection 30 Units  30 Units Subcutaneous QHS Rande Brunt, NP   30 Units at 02/17/14 2103  . losartan (COZAAR) tablet 12.5 mg  12.5 mg Oral Daily Rande Brunt, NP   12.5 mg at 02/18/14 1205  . milrinone (PRIMACOR) 20 MG/100ML (0.2 mg/mL) infusion  0.25 mcg/kg/min Intravenous Continuous Rande Brunt, NP 6.4 mL/hr at 02/18/14 1107 0.25 mcg/kg/min at 02/18/14 1107  . ondansetron (ZOFRAN) injection 4 mg  4 mg Intravenous Q6H PRN Rande Brunt, NP      . potassium chloride SA (K-DUR,KLOR-CON) CR tablet 20 mEq  20 mEq Oral Daily Rande Brunt, NP   20 mEq at 02/18/14 1009  . sodium chloride 0.9 % injection 3 mL  3 mL Intravenous Q12H Rande Brunt, NP   3 mL at 02/17/14 0905  . sodium chloride 0.9 % injection 3 mL  3 mL Intravenous PRN Rande Brunt, NP      . vancomycin (VANCOCIN) IVPB 750 mg/150 ml premix  750 mg Intravenous Q12H Charmian Muff [place], RPH   750 mg at 02/18/14 0354    Physical Exam: General appearance: alert and no distress Lungs: clear to auscultation bilaterally Heart: regular rate  and rhythm and systolic murmur: early systolic 3/6, blowing at apex Abdomen: soft, non-tender; bowel sounds normal; no masses,  no organomegaly Extremities: extremities normal, atraumatic, no cyanosis or edema and there is a 3 cm rubbery mass over the mid-thoracic spine  Lab Results: Results for orders placed or performed during the hospital encounter of 02/16/14 (from the past 48 hour(s))  MRSA PCR Screening     Status: None   Collection Time: 02/16/14  2:34 PM  Result Value Ref Range   MRSA by PCR NEGATIVE NEGATIVE    Comment:        The GeneXpert MRSA Assay (FDA approved for NASAL specimens only), is one component of a comprehensive MRSA colonization surveillance program. It is not intended to diagnose MRSA infection nor to guide or monitor treatment for MRSA infections.   Pro b natriuretic peptide (BNP)     Status: Abnormal   Collection Time: 02/16/14  3:00  PM  Result Value Ref Range   Pro B Natriuretic peptide (BNP) 1033.0 (H) 0 - 125 pg/mL  Culture, blood (routine x 2)     Status: None (Preliminary result)   Collection Time: 02/16/14  4:10 PM  Result Value Ref Range   Specimen Description BLOOD RIGHT HAND    Special Requests BOTTLES DRAWN AEROBIC ONLY 2CC    Culture  Setup Time      02/16/2014 21:39 Performed at [place] NO GROWTH TO DATE CULTURE WILL BE HELD FOR 5 DAYS BEFORE ISSUING A FINAL NEGATIVE REPORT Performed at [facility]    Report Status PENDING   Culture, blood (routine x 2)     Status: None (Preliminary result)   Collection Time: 02/16/14  4:16 PM  Result Value Ref Range   Specimen Description BLOOD LEFT HAND    Special Requests BOTTLES DRAWN AEROBIC ONLY 10CC    Culture  Setup Time      02/16/2014 21:43 Performed at [place] NO GROWTH TO DATE CULTURE WILL BE HELD FOR 5 DAYS BEFORE ISSUING A FINAL NEGATIVE REPORT Performed at [facility]    Report Status PENDING   Comprehensive metabolic panel     Status: Abnormal   Collection Time: 02/16/14  4:30 PM  Result Value Ref Range   Sodium 142 137 - 147 mEq/L   Potassium 3.7 3.7 - 5.3 mEq/L   Chloride 102 96 - 112 mEq/L   CO2 25 19 - 32 mEq/L   Glucose, Bld 135 (H) 70 - 99 mg/dL   BUN 38 (H) 6 - 23 mg/dL   Creatinine, Ser 1.55 (H) 0.50 - 1.10 mg/dL   Calcium 9.4 8.4 - 10.5 mg/dL   Total Protein 7.6 6.0 - 8.3 g/dL   Albumin 3.9 3.5 - 5.2 g/dL   AST 34 0 - 37 U/L   ALT 58 (H) 0 - 35 U/L   Alkaline Phosphatase 122 (H) 39 - 117 U/L   Total Bilirubin 0.3 0.3 - 1.2 mg/dL   GFR calc non Af Amer 36 (L) >90 mL/min   GFR calc Af Amer 42 (L) >90 mL/min    Comment: (NOTE) The eGFR has been calculated using the CKD EPI equation. This calculation has not been validated in all clinical situations. eGFR's persistently <90 mL/min signify  possible  Chronic Kidney Disease.    Anion gap 15 5 - 15  Magnesium     Status: None   Collection Time: 02/16/14  4:30 PM  Result Value Ref Range   Magnesium 2.1 1.5 - 2.5 mg/dL  Digoxin level     Status: Abnormal   Collection Time: 02/16/14  4:30 PM  Result Value Ref Range   Digoxin Level <0.3 (L) 0.8 - 2.0 ng/mL  CBC WITH DIFFERENTIAL     Status: Abnormal   Collection Time: 02/16/14  4:30 PM  Result Value Ref Range   WBC 4.6 4.0 - 10.5 K/uL   RBC 3.86 (L) 3.87 - 5.11 MIL/uL   Hemoglobin 10.4 (L) 12.0 - 15.0 g/dL   HCT 33.0 (L) 36.0 - 46.0 %   MCV 85.5 78.0 - 100.0 fL   MCH 26.9 26.0 - 34.0 pg   MCHC 31.5 30.0 - 36.0 g/dL   RDW 17.3 (H) 11.5 - 15.5 %   Platelets 140 (L) 150 - 400 K/uL   Neutrophils Relative % 60 43 - 77 %   Neutro Abs 2.8 1.7 - 7.7 K/uL   Lymphocytes Relative 30 12 - 46 %   Lymphs Abs 1.4 0.7 - 4.0 K/uL   Monocytes Relative 6 3 - 12 %   Monocytes Absolute 0.3 0.1 - 1.0 K/uL   Eosinophils Relative 3 0 - 5 %   Eosinophils Absolute 0.1 0.0 - 0.7 K/uL   Basophils Relative 1 0 - 1 %   Basophils Absolute 0.0 0.0 - 0.1 K/uL  Glucose, capillary     Status: Abnormal   Collection Time: 02/16/14  4:52 PM  Result Value Ref Range   Glucose-Capillary 127 (H) 70 - 99 mg/dL   Comment 1 Venous Sample   Carboxyhemoglobin     Status: Abnormal   Collection Time: 02/16/14  7:00 PM  Result Value Ref Range   Total hemoglobin 8.3 (L) 12.0 - 16.0 g/dL   O2 Saturation 59.8 %   Carboxyhemoglobin 0.9 0.5 - 1.5 %   Methemoglobin 0.8 0.0 - 1.5 %  Cath Tip Culture     Status: None (Preliminary result)   Collection Time: 02/16/14  8:39 PM  Result Value Ref Range   Specimen Description CATH TIP    Special Requests RIGHT IJ PICC    Culture      NO GROWTH 1 DAY Performed at [facility]    Report Status PENDING   Wound culture     Status: None (Preliminary result)   Collection Time: 02/16/14  8:40 PM  Result Value Ref Range   Specimen Description WOUND CATH SITE     Special Requests NONE    Gram Stain      NO WBC SEEN NO SQUAMOUS EPITHELIAL CELLS SEEN NO ORGANISMS SEEN Performed at [facility]    Culture      NO GROWTH 2 DAYS Performed at [facility]    Report Status PENDING   Glucose, capillary     Status: Abnormal   Collection Time: 02/16/14  9:35 PM  Result Value Ref Range   Glucose-Capillary 139 (H) 70 - 99 mg/dL   Comment 1 Capillary Sample   Basic metabolic panel     Status: Abnormal   Collection Time: 02/17/14  2:40 AM  Result Value Ref Range   Sodium 143 137 - 147 mEq/L   Potassium 3.5 (L) 3.7 - 5.3 mEq/L   Chloride 105 96 - 112 mEq/L   CO2 26 19 -  32 mEq/L   Glucose, Bld 91 70 - 99 mg/dL   BUN 35 (H) 6 - 23 mg/dL   Creatinine, Ser 1.40 (H) 0.50 - 1.10 mg/dL   Calcium 9.4 8.4 - 10.5 mg/dL   GFR calc non Af Amer 41 (L) >90 mL/min   GFR calc Af Amer 48 (L) >90 mL/min    Comment: (NOTE) The eGFR has been calculated using the CKD EPI equation. This calculation has not been validated in all clinical situations. eGFR's persistently <90 mL/min signify possible Chronic Kidney Disease.    Anion gap 12 5 - 15  Glucose, capillary     Status: Abnormal   Collection Time: 02/17/14  7:22 AM  Result Value Ref Range   Glucose-Capillary 105 (H) 70 - 99 mg/dL   Comment 1 Capillary Sample   Glucose, capillary     Status: Abnormal   Collection Time: 02/17/14 12:41 PM  Result Value Ref Range   Glucose-Capillary 66 (L) 70 - 99 mg/dL  Glucose, capillary     Status: Abnormal   Collection Time: 02/17/14  1:36 PM  Result Value Ref Range   Glucose-Capillary 117 (H) 70 - 99 mg/dL  Glucose, capillary     Status: Abnormal   Collection Time: 02/17/14  4:25 PM  Result Value Ref Range   Glucose-Capillary 136 (H) 70 - 99 mg/dL   Comment 1 Notify RN   Glucose, capillary     Status: None   Collection Time: 02/17/14  8:59 PM  Result Value Ref Range   Glucose-Capillary 94 70 - 99 mg/dL  Basic metabolic panel     Status: Abnormal    Collection Time: 02/18/14  3:56 AM  Result Value Ref Range   Sodium 138 137 - 147 mEq/L   Potassium 3.8 3.7 - 5.3 mEq/L   Chloride 99 96 - 112 mEq/L   CO2 23 19 - 32 mEq/L   Glucose, Bld 155 (H) 70 - 99 mg/dL   BUN 32 (H) 6 - 23 mg/dL   Creatinine, Ser 1.33 (H) 0.50 - 1.10 mg/dL   Calcium 9.6 8.4 - 10.5 mg/dL   GFR calc non Af Amer 44 (L) >90 mL/min   GFR calc Af Amer 51 (L) >90 mL/min    Comment: (NOTE) The eGFR has been calculated using the CKD EPI equation. This calculation has not been validated in all clinical situations. eGFR's persistently <90 mL/min signify possible Chronic Kidney Disease.    Anion gap 16 (H) 5 - 15  Glucose, capillary     Status: Abnormal   Collection Time: 02/18/14  5:50 AM  Result Value Ref Range   Glucose-Capillary 135 (H) 70 - 99 mg/dL  Glucose, capillary     Status: None   Collection Time: 02/18/14 11:38 AM  Result Value Ref Range   Glucose-Capillary 86 70 - 99 mg/dL   Comment 1 Documented in Chart    Comment 2 Notify RN     Imaging: Ir Removal Tun Cv Cath W/o Fl  02/17/2014   CLINICAL DATA:  58 year old female with cardiac arrhythmia and cardiomyopathy. A tunneled right IJ PICC was placed by interventional Radiology on 01/25/2014 for outpatient Milrinone the infusion.  The patient did well until last Tuesday when her home health nurse noted some erythema about the catheter exit site. Over the past week, the erythema has gotten slightly worse and there is now some mild drainage from the catheter exit site. This is accompanied by subjective fever and chills. The patient has no leukocytosis  or fever. There is a mild erythema about the skin exit site as well as a small volume of milky drainage. No fluctuance along the tunnel.  EXAM: REMOVAL OF TUNNELED CENTRAL VENOUS CATHETER  PROCEDURE: The right chest tunneled PICC catheter site was prepped with chlorhexidine. A sterile gown and gloves were worn during the procedure.  The subcutaneous cuff was freed  with gentle manual traction. The catheter was then successfully removed in its entirety. The tip was cut and sent for culture. Routine wound culture specimens were then obtained from the catheter exit site and proximal subcutaneous tunnel. A sterile dressing was applied over the catheter exit site.  COMPLICATIONS: None  IMPRESSION: Removal of tunneled PICC for possible superficial skin infection at the catheter entry site. Both the catheter tip and the skin entry site and proximal tunnel were cultured.  Signed,  Criselda Peaches, MD  [facility]  [facility]   Electronically Signed   By: Jacqulynn Cadet M.D.   On: 02/17/2014 09:15    Assessment:  1. Active Problems: 2.   CKD (chronic kidney disease) stage 3, GFR 30-59 ml/min 3.   Chronic systolic heart failure 4.   PICC line infection 5.   Plan:  1. Clinically stable from a heart failure standpoint. Plan for replacement of the PICC line on Monday. She pointed out a mass on her back, which appears to be a lipoma, but she thinks this is new.  ?abscess - will assess with ultrasound today.  Time Spent Directly with Patient:  15 minutes  Length of Stay:  LOS: 2 days   Pixie Casino, MD, [facility] Attending Cardiologist [facility]  Elenora Hawbaker C 02/18/2014, 12:51 PM

## 2014-02-19 LAB — BASIC METABOLIC PANEL
ANION GAP: 16 — AB (ref 5–15)
BUN: 34 mg/dL — ABNORMAL HIGH (ref 6–23)
CALCIUM: 9.5 mg/dL (ref 8.4–10.5)
CO2: 24 mEq/L (ref 19–32)
Chloride: 97 mEq/L (ref 96–112)
Creatinine, Ser: 1.37 mg/dL — ABNORMAL HIGH (ref 0.50–1.10)
GFR, EST AFRICAN AMERICAN: 49 mL/min — AB (ref >90)
GFR, EST NON AFRICAN AMERICAN: 42 mL/min — AB (ref >90)
Glucose, Bld: 99 mg/dL (ref 70–99)
Potassium: 3.9 mEq/L (ref 3.7–5.3)
SODIUM: 137 meq/L (ref 137–147)

## 2014-02-19 LAB — GLUCOSE, CAPILLARY
GLUCOSE-CAPILLARY: 94 mg/dL (ref 70–99)
GLUCOSE-CAPILLARY: 119 mg/dL — AB (ref 70–99)
Glucose-Capillary: 90 mg/dL (ref 70–99)
Glucose-Capillary: 74 mg/dL (ref 70–99)

## 2014-02-19 LAB — VANCOMYCIN, TROUGH: VANCOMYCIN TR: 20.1 ug/mL — AB (ref 10.0–20.0)

## 2014-02-19 LAB — WOUND CULTURE
Culture: NO GROWTH
Gram Stain: NONE SEEN

## 2014-02-19 LAB — CATH TIP CULTURE: CULTURE: NO GROWTH

## 2014-02-19 MED ORDER — VANCOMYCIN HCL IN DEXTROSE 750-5 MG/150ML-% IV SOLN
750.0000 mg | INTRAVENOUS | Status: DC
  Administered 2014-02-20: 750 mg via INTRAVENOUS
  Filled 2014-02-19: qty 150

## 2014-02-19 NOTE — Consult Note (Signed)
ANTIBIOTIC CONSULT NOTE - Follow Up  Pharmacy Consult for Vancomycin Indication: PICC line infection  No Known Allergies  Patient Measurements: Height: 5\' 4"  (162.6 cm) Weight: 185 lb 13.6 oz (84.3 kg) IBW/kg (Calculated) : 54.7  Vital Signs: Temp: 97.8 F (36.6 C) (12/20 1300) Temp Source: Oral (12/20 1300) BP: 113/59 mmHg (12/20 1634) Pulse Rate: 60 (12/20 1634) Intake/Output from previous day: 12/19 0701 - 12/20 0700 In: 996.8 [P.O.:920; I.V.:76.8] Out: 1850 [Urine:1850] Intake/Output from this shift: Total I/O In: 651.2 [P.O.:600; I.V.:51.2] Out: 32 [Urine:950]  Microbiology: Recent Results (from the past 720 hour(s))  MRSA PCR Screening     Status: None   Collection Time: 02/16/14  2:34 PM  Result Value Ref Range Status   MRSA by PCR NEGATIVE NEGATIVE Final    Comment:        The GeneXpert MRSA Assay (FDA approved for NASAL specimens only), is one component of a comprehensive MRSA colonization surveillance program. It is not intended to diagnose MRSA infection nor to guide or monitor treatment for MRSA infections.   Culture, blood (routine x 2)     Status: None (Preliminary result)   Collection Time: 02/16/14  4:10 PM  Result Value Ref Range Status   Specimen Description BLOOD RIGHT HAND  Final   Special Requests BOTTLES DRAWN AEROBIC ONLY 2CC  Final   Culture  Setup Time   Final    02/16/2014 21:39 Performed at [facility]    Culture   Final           BLOOD CULTURE RECEIVED NO GROWTH TO DATE CULTURE WILL BE HELD FOR 5 DAYS BEFORE ISSUING A FINAL NEGATIVE REPORT Performed at [facility]    Report Status PENDING  Incomplete  Culture, blood (routine x 2)     Status: None (Preliminary result)   Collection Time: 02/16/14  4:16 PM  Result Value Ref Range Status   Specimen Description BLOOD LEFT HAND  Final   Special Requests BOTTLES DRAWN AEROBIC ONLY 10CC  Final   Culture  Setup Time   Final    02/16/2014 21:43 Performed at [facility]    Culture   Final           BLOOD CULTURE RECEIVED NO GROWTH TO DATE CULTURE WILL BE HELD FOR 5 DAYS BEFORE ISSUING A FINAL NEGATIVE REPORT Performed at [facility]    Report Status PENDING  Incomplete  Cath Tip Culture     Status: None   Collection Time: 02/16/14  8:39 PM  Result Value Ref Range Status   Specimen Description CATH TIP  Final   Special Requests RIGHT IJ PICC  Final   Culture   Final    NO GROWTH 2 DAYS Performed at [facility]    Report Status 02/19/2014 FINAL  Final  Wound culture     Status: None   Collection Time: 02/16/14  8:40 PM  Result Value Ref Range Status   Specimen Description WOUND CATH SITE  Final   Special Requests NONE  Final   Gram Stain   Final    NO WBC SEEN NO SQUAMOUS EPITHELIAL CELLS SEEN NO ORGANISMS SEEN Performed at [facility]    Culture   Final    NO GROWTH 2 DAYS Performed at [facility]    Report Status 02/19/2014 FINAL  Final   Medical History: Past Medical History  Diagnosis Date  . Coronary artery disease     a. s/p CABG  x 5 with MV annuloplasty 2007   . Diabetes mellitus   . Hypertension   . Chronic systolic heart failure     a. ICM b. ECHO (06/2013): EF 15%, severe MR (06/2013) c. RHC (10/2013): RA 5, RV 23/2/5, PA 25/6 (14), PCWP 12, Fick CO/CI: 3.2/1.7, PVR 0.7 WU, PA 45%, 47% and 55% (with levophed 5), vagal response during cath   . Polysubstance abuse     history of  (cocaine, tobacco and ETOH)  . V-tach   . Ischemic cardiomyopathy     a. s/p ICD b. LHC (10/04/13): 1. Severe native CAD with all bypass grafts patent    . Implantable defibrillator   medtronic   . LBBB (left bundle branch block)   . Presence of permanent cardiac pacemaker   . AICD (automatic cardioverter/defibrillator) present   . CHF (congestive heart failure)    Assessment: 58yof on home milrinone being admitted from the HF clinic for possible PICC line infection. Was started on po doxycycline  yesterday and now will broaden to IV vancomycin. She has CKD - no HD yet.  Her creatinine today is 1.37 and her estimated crcl is 44ml/min.    Culture Data: 12/17 Blood x 2 - NGTD 12/17 Rt. IJ cath tip - NGTD 12/17 Wound - NGTD  Vancomycin trough 12/20 = 20.1 mcg/ml  Goal of Therapy:  Vancomycin trough level 10-15 mcg/ml  Plan:  1) Decrease IV Vancomycin to 750 mg every 18 hours to extend clearance interval 2) Follow up labs for maintenance dosing  Rober Minion, PharmD., MS Clinical Pharmacist Pager:  [phone] Thank you for allowing pharmacy to be part of this patients care team. 02/19/2014 5:08 PM

## 2014-02-19 NOTE — Progress Notes (Signed)
Advanced Heart Failure Rounding Note   Subjective:   58 year old female with history of HTN, DM2, past polysubstance abuse (ETOH, tobacco, cocaine), CAD s/p CABG x5 with MV annuloplasty (2007), ICM s/p ICD (CRT upgrade 37/34), chronic systolic HF EF 28%, severe MR, CKD stage III and PAD. Blood type O+  On 9/25 underwent RHC for low output symptoms. Hemodynamic borderline. Swan left in and she was observed. Cardiac output dropped while in hospital so started on milrinone. Discharged home. Underwent CRT-D upgrade on 12/02/13.  Has been followed closely in the HF clinic for work-up of advanced therapies. She is also being seen by [facility] for possible tx.  Admitted for suspected infected tunneled catheter. Blood cultures obtained and negative so far. Tunneled catheter was removed.   Feeling fine. Afebrile.  Denies SOB. Has not ambulated much yet. 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   Objective:   Weight Range:  Vital Signs:   Temp:  [97.6 F (36.4 C)-97.8 F (36.6 C)] 97.7 F (36.5 C) (12/20 0643) Pulse Rate:  [49-68] 49 (12/20 0643) Resp:  [18] 18 (12/20 0643) BP: (101-115)/(54-61) 102/61 mmHg (12/20 0643) SpO2:  [95 %-99 %] 99 % (12/20 0643) Weight:  [84.3 kg (185 lb 13.6 oz)] 84.3 kg (185 lb 13.6 oz) (12/20 0643) Last BM Date: 02/18/14  Weight change: Filed Weights   02/17/14 1041 02/18/14 0524 02/19/14 0643  Weight: 84.687 kg (186 lb 11.2 oz) 84.46 kg (186 lb 3.2 oz) 84.3 kg (185 lb 13.6 oz)    Intake/Output:   Intake/Output Summary (Last 24 hours) at 02/19/14 0837 Last data filed at 02/19/14 0800  Gross per 24 hour  Intake  996.8 ml  Output   2000 ml  Net -1003.2 ml     Physical Exam: General: Well appearing. No resp difficulty .  HEENT: normal Neck: supple. JVP 5-6 Carotids 2+ bilaterally; no bruits. No lymphadenopathy or thryomegaly appreciated. Cor: PMI normal. Regular rate & rhythm. 2/6 SEM LSB 2/6 MR no s3  Lungs: clear Abdomen: obese,soft, nontender, mildly distended. No  hepatosplenomegaly. No bruits or masses. Good bowel sounds. Extremities: no cyanosis, clubbing, rash, edema. Neuro: alert & orientedx3, cranial nerves grossly intact. Moves all 4 extremities w/o difficulty. Affect pleasant.  Telemetry: SR Labs: Basic Metabolic Panel:  Recent Labs Lab 02/16/14 1630 02/17/14 0240 02/18/14 0356 02/19/14 0339  NA 142 143 138 137  K 3.7 3.5* 3.8 3.9  CL 102 105 99 97  CO2 25 26 23 24   GLUCOSE 135* 91 155* 99  BUN 38* 35* 32* 34*  CREATININE 1.55* 1.40* 1.33* 1.37*  CALCIUM 9.4 9.4 9.6 9.5  MG 2.1  --   --   --     Liver Function Tests:  Recent Labs Lab 02/16/14 1630  AST 34  ALT 58*  ALKPHOS 122*  BILITOT 0.3  PROT 7.6  ALBUMIN 3.9   No results for input(s): LIPASE, AMYLASE in the last 168 hours. No results for input(s): AMMONIA in the last 168 hours.  CBC:  Recent Labs Lab 02/16/14 1630  WBC 4.6  NEUTROABS 2.8  HGB 10.4*  HCT 33.0*  MCV 85.5  PLT 140*    Cardiac Enzymes: No results for input(s): CKTOTAL, CKMB, CKMBINDEX, TROPONINI in the last 168 hours.  BNP: BNP (last 3 results)  Recent Labs  06/07/13 1041 11/24/13 1236 02/16/14 1500  PROBNP 899.80* 1349.0* 1033.0*     Other results:  EKG:   Imaging: [place] Chest  02/18/2014   CLINICAL DATA:  Mass over the  mid thoracic spine.  EXAM: CHEST ULTRASOUND  COMPARISON:  CT chest 10/06/2013, 10/13/2005  FINDINGS: Real-time sonography of the posterior chest was performed overlying the thoracic spine. There is a 5.4 x 1.9 x 3.5 cm homogeneous mass which is isoechoic to the surrounding adipose tissue. There are a few thin internal septations. There is no nodularity.  There is no other solid or cystic mass.  IMPRESSION: 5.4 x 1.9 x 3.5 cm homogeneous mass overlying the thoracic spine within the subcutaneous fat most consistent with a lipoma which is confirmed when correlated with recent CT chest dated 10/06/2013.   Electronically Signed   By: Kathreen Devoid   On: 02/18/2014 18:21      Medications:     Scheduled Medications: . amiodarone  200 mg Oral Daily  . aspirin EC  81 mg Oral Daily  . atorvastatin  80 mg Oral q1800  . digoxin  0.125 mg Oral QODAY  . enoxaparin (LOVENOX) injection  40 mg Subcutaneous Q24H  . furosemide  40 mg Oral QAC supper  . furosemide  80 mg Oral q morning - 10a  . insulin aspart  0-15 Units Subcutaneous TID WC  . insulin glargine  30 Units Subcutaneous QHS  . losartan  12.5 mg Oral Daily  . potassium chloride  20 mEq Oral Daily  . sodium chloride  3 mL Intravenous Q12H  . vancomycin  750 mg Intravenous Q12H    Infusions: . milrinone 0.25 mcg/kg/min (02/18/14 1800)    PRN Medications: sodium chloride, acetaminophen, ondansetron (ZOFRAN) IV, sodium chloride   Assessment:   1) PICC infection 2) Chronic systolic HF - on milrinone 0.25 mcg 3) ICM s/p ICD 4) CKD stage III 5) CAD s/p CABG   Plan/Discussion:    Afebrile. Tunnel catheter out. Blood cultures negative so far. On Vancomycin.  Continues on Milrinone 0.375 mcg via peripheral IV . Will plan tunneled PICC in [facility] tomorrow. Continue lasix 80 mg in am and 40 mg in pm. Continue digoxin and losartan at current dose. CR to see. Hopefully home tomorrow  Benay Spice 8:37 AM

## 2014-02-20 ENCOUNTER — Inpatient Hospital Stay (HOSPITAL_COMMUNITY): Payer: Medicare Other

## 2014-02-20 ENCOUNTER — Encounter (HOSPITAL_COMMUNITY): Payer: Self-pay | Admitting: Physician Assistant

## 2014-02-20 LAB — BASIC METABOLIC PANEL
ANION GAP: 15 (ref 5–15)
BUN: 34 mg/dL — ABNORMAL HIGH (ref 6–23)
CO2: 25 mEq/L (ref 19–32)
Calcium: 9.5 mg/dL (ref 8.4–10.5)
Chloride: 97 mEq/L (ref 96–112)
Creatinine, Ser: 1.32 mg/dL — ABNORMAL HIGH (ref 0.50–1.10)
GFR calc Af Amer: 51 mL/min — ABNORMAL LOW (ref >90)
GFR, EST NON AFRICAN AMERICAN: 44 mL/min — AB (ref >90)
GLUCOSE: 104 mg/dL — AB (ref 70–99)
Potassium: 3.8 mEq/L (ref 3.7–5.3)
SODIUM: 137 meq/L (ref 137–147)

## 2014-02-20 LAB — GLUCOSE, CAPILLARY
Glucose-Capillary: 91 mg/dL (ref 70–99)
Glucose-Capillary: 99 mg/dL (ref 70–99)
Glucose-Capillary: 116 mg/dL — ABNORMAL HIGH (ref 70–99)

## 2014-02-20 MED ORDER — DIGOXIN 0.0625 MG HALF TABLET: 0.0625 mg | ORAL_TABLET | ORAL | Status: DC

## 2014-02-20 MED ORDER — MILRINONE IN DEXTROSE 20 MG/100ML IV SOLN: 0.2500 ug/kg/min | INTRAVENOUS | Status: DC

## 2014-02-20 MED ORDER — DOXYCYCLINE HYCLATE 100 MG PO TBEC: 100.0000 mg | DELAYED_RELEASE_TABLET | Freq: Two times a day (BID) | ORAL | Status: DC

## 2014-02-20 MED ORDER — DOXYCYCLINE HYCLATE 100 MG PO TABS
100.0000 mg | ORAL_TABLET | Freq: Two times a day (BID) | ORAL | Status: DC
  Filled 2014-02-20: qty 1

## 2014-02-20 MED ORDER — LIDOCAINE HCL 1 % IJ SOLN
INTRAMUSCULAR | Status: AC
  Filled 2014-02-20: qty 20

## 2014-02-20 NOTE — Progress Notes (Signed)
Advanced Heart Failure Rounding Note   Subjective:   58 year old female with history of HTN, DM2, past polysubstance abuse (ETOH, tobacco, cocaine), CAD s/p CABG x5 with MV annuloplasty (2007), ICM s/p ICD (CRT upgrade 33/35), chronic systolic HF EF 45%, severe MR, CKD stage III and PAD. Blood type O+  On 9/25 underwent RHC for low output symptoms. Hemodynamic borderline. Swan left in and she was observed. Cardiac output dropped while in hospital so started on milrinone. Discharged home. Underwent CRT-D upgrade on 12/02/13.  Has been followed closely in the HF clinic for work-up of advanced therapies. She is also being seen by [facility] for possible tx.  Admitted for suspected infected tunneled catheter. Blood cultures obtained and negative so far. Tunneled catheter was removed.   Feeling fine. Afebrile.  Walking hall. Dig dose held due to bradycardia - now better. Will readjust dig back to home dosing (admit level was ok). Will replae PICC today  Objective:   Weight Range:  Vital Signs:   Temp:  [97.6 F (36.4 C)-98.2 F (36.8 C)] 98.2 F (36.8 C) (12/21 0552) Pulse Rate:  [54-62] 60 (12/21 0921) Resp:  [18-20] 18 (12/21 0552) BP: (95-115)/(52-61) 112/61 mmHg (12/21 0921) SpO2:  [93 %-98 %] 98 % (12/21 0921) Weight:  [84.3 kg (185 lb 13.6 oz)] 84.3 kg (185 lb 13.6 oz) (12/21 0552) Last BM Date: 02/19/14  Weight change: Filed Weights   02/18/14 0524 02/19/14 0643 02/20/14 0552  Weight: 84.46 kg (186 lb 3.2 oz) 84.3 kg (185 lb 13.6 oz) 84.3 kg (185 lb 13.6 oz)    Intake/Output:   Intake/Output Summary (Last 24 hours) at 02/20/14 1220 Last data filed at 02/20/14 1001  Gross per 24 hour  Intake 935.28 ml  Output   2250 ml  Net -1314.72 ml     Physical Exam: General: Well appearing. No resp difficulty .  HEENT: normal Neck: supple. JVP 5-6 Carotids 2+ bilaterally; no bruits. No lymphadenopathy or thryomegaly appreciated. Cor: PMI normal. Regular rate & rhythm. 2/6 SEM LSB  2/6 MR no s3  Lungs: clear Abdomen: obese,soft, nontender, mildly distended. No hepatosplenomegaly. No bruits or masses. Good bowel sounds. Extremities: no cyanosis, clubbing, rash, edema. Neuro: alert & orientedx3, cranial nerves grossly intact. Moves all 4 extremities w/o difficulty. Affect pleasant.  Telemetry: SR Labs: Basic Metabolic Panel:  Recent Labs Lab 02/16/14 1630 02/17/14 0240 02/18/14 0356 02/19/14 0339 02/20/14 0359  NA 142 143 138 137 137  K 3.7 3.5* 3.8 3.9 3.8  CL 102 105 99 97 97  CO2 25 26 23 24 25   GLUCOSE 135* 91 155* 99 104*  BUN 38* 35* 32* 34* 34*  CREATININE 1.55* 1.40* 1.33* 1.37* 1.32*  CALCIUM 9.4 9.4 9.6 9.5 9.5  MG 2.1  --   --   --   --     Liver Function Tests:  Recent Labs Lab 02/16/14 1630  AST 34  ALT 58*  ALKPHOS 122*  BILITOT 0.3  PROT 7.6  ALBUMIN 3.9   No results for input(s): LIPASE, AMYLASE in the last 168 hours. No results for input(s): AMMONIA in the last 168 hours.  CBC:  Recent Labs Lab 02/16/14 1630  WBC 4.6  NEUTROABS 2.8  HGB 10.4*  HCT 33.0*  MCV 85.5  PLT 140*    Cardiac Enzymes: No results for input(s): CKTOTAL, CKMB, CKMBINDEX, TROPONINI in the last 168 hours.  BNP: BNP (last 3 results)  Recent Labs  06/07/13 1041 11/24/13 1236 02/16/14 1500  PROBNP  899.80* 1349.0* 1033.0*     Other results:  EKG:   Imaging: [place] Chest  02/18/2014   CLINICAL DATA:  Mass over the mid thoracic spine.  EXAM: CHEST ULTRASOUND  COMPARISON:  CT chest 10/06/2013, 10/13/2005  FINDINGS: Real-time sonography of the posterior chest was performed overlying the thoracic spine. There is a 5.4 x 1.9 x 3.5 cm homogeneous mass which is isoechoic to the surrounding adipose tissue. There are a few thin internal septations. There is no nodularity.  There is no other solid or cystic mass.  IMPRESSION: 5.4 x 1.9 x 3.5 cm homogeneous mass overlying the thoracic spine within the subcutaneous fat most consistent with a lipoma  which is confirmed when correlated with recent CT chest dated 10/06/2013.   Electronically Signed   By: Kathreen Devoid   On: 02/18/2014 18:21     Medications:     Scheduled Medications: . amiodarone  200 mg Oral Daily  . aspirin EC  81 mg Oral Daily  . atorvastatin  80 mg Oral q1800  . [START ON 02/21/2014] digoxin  0.0625 mg Oral QODAY  . doxycycline  100 mg Oral Q12H  . enoxaparin (LOVENOX) injection  40 mg Subcutaneous Q24H  . furosemide  40 mg Oral QAC supper  . furosemide  80 mg Oral q morning - 10a  . insulin aspart  0-15 Units Subcutaneous TID WC  . insulin glargine  30 Units Subcutaneous QHS  . losartan  12.5 mg Oral Daily  . potassium chloride  20 mEq Oral Daily  . sodium chloride  3 mL Intravenous Q12H    Infusions: . milrinone 0.25 mcg/kg/min (02/20/14 1050)    PRN Medications: sodium chloride, acetaminophen, ondansetron (ZOFRAN) IV, sodium chloride   Assessment:   1) PICC infection 2) Chronic systolic HF - on milrinone 0.25 mcg 3) ICM s/p ICD 4) CKD stage III 5) CAD s/p CABG   Plan/Discussion:    Afebrile. Tunnel catheter out. Blood cultures negative so far. On Vancomycin.  Continues on Milrinone 0.375 mcg via peripheral IV . Will plan tunneled PICC in [place] today. Likely home after line placed. [facility] aware. Restart dig at .20254 every other day (home dose).   Discussed with ID. Will send home with doxycycline 100 bid for 3 more days.   Kimaya Whitlatch,MD 12:20 PM

## 2014-02-20 NOTE — Progress Notes (Signed)
CARDIAC REHAB PHASE I   PRE:  Rate/Rhythm: 53 SB  BP:  Supine:  Sitting: 100/54  Standing:    SaO2: 92 RA  MODE:  Ambulation: 460 ft   POST:  Rate/Rhythm: 73  BP:  Supine:   Sitting: 110/60  Standing:    SaO2: 97 RA 0825-0920 Pt tolerated ambulation well without c/o of cp. She does c/o of some SOB walking, RA sat after walk 97%. Pt to side of bed after walk with call light in reach. Reviewed CHF education with pt. She voices understanding of CHF and zones. Pt knows sodium and fluid restrictions. She has been weighing herself daily. Pt admits to not follow her sodium restrictions and eating out more often. I gave her sodium content of food list and tips for following a low sodium diet. We discussed daily exercise and Outpt. CRP. Pt is interested in attending [facility] program, will send referral. Instructed pt to watch CHF video.  Rodney Langton RN 02/20/2014 9:17 AM

## 2014-02-20 NOTE — Discharge Summary (Signed)
Discharge Summary   Patient ID: [name] MRN: [id number], DOB/AGE: [birth year] 58 y.o. Admit date: 02/16/2014 D/C date:     02/20/2014  Primary Care Provider: Arman Filter, MD Primary Cardiologist: Renuka Farfan - CHF  Primary Discharge Diagnoses:  1. PICC line infection s/p removal, replacement 2. Chronic systolic CHF with EF 00% on home milrinone 3. ICM s/p ICD (CRT upgrade 10/15) 4. CKD stage III 5. CAD s/p CABGx5 with MV annuloplasty in 2007 - [facility] (10/04/13): 1. Severe native CAD with all bypass grafts patent  6. Lipoma  Secondary Discharge Diagnoses:  1. H/o polysubstance abuse (EtOH, tobacco, cocaine) 2. Severe MR 3. PAD 4. DM 5. HTN 6. H/o V-tach per chart 7. LBBB   Hospital Course: [name] is a 58 year old female with history of HTN, DM2, past polysubstance abuse (ETOH, tobacco, cocaine), CAD s/p CABG x5 with MV annuloplasty (2007), ICM s/p [employer] ICD(CRT upgrade 93/81), chronic systolic HF EF 82%, severe MR, CKD stage III, PAD who presented to [facility] with a suspected PICC line infection.  On 9/25 she underwent RHC for low output symptoms. Hemodynamic borderline. Swan left in and she was observed. Cardiac output dropped while in hospital so started on milrinone. Discharged home. Underwent CRT-D upgrade on 12/02/13.  She has been followed closely in the HF clinic for work-up of advanced therapies. She is also being seen by [facility] for possible tx. She to clinic 02/15/14 for minimal SOB and tenderness of tunneled PICC. It was tender to touch withh erythema and scant yellowish drainage. The CHF team wanted to admit her yesterday for discontinuation of PICC and culture of tip, however patient wanted to wait until the following day. She was started on doxycycline 100 mg BID x 4 doses. Her weight was stable  - she was told to take extra Lasix that day and increase to 80mg  QAM/40mg  QPM. She returned for admission the following day on 02/16/14 at which time she  was also reporting DOE with minimal activity however per [name]'s note "every time we have gotten co-ox's they have been good." Her PICC line was removed and the tip was sent for culture. Blood cultures were obtained but these were post-doxycycline initiation. She was treated with vancomycin while inpatient. Her milrinone was continued at 0.25 (notes indicate 0.375 but MAR confirms 0.25 and Dr. Haroldine Laws clarifies he wants to continue 0.25). The plan was to observe blood cultures and if they negative, to replace PICC. By today (02/20/14) blood cultures have remained negative. Wound culture also has not grown anything yet. She was sent to IR for replacement of PICC line. She will continue doxycycline 100mg  BID x 3 more days (6 more doses). Of note, digoxin was held due to bradycardia which has improved and Dr. Haroldine Laws wants to resume to home dosing given admit level was OK. During this admission she also reported a rubbery mass on her back which was likely lipoma per [name]. Dr. Haroldine Laws has seen and examined the patient today and feels she is stable for discharge.  Discharge Vitals: Blood pressure 112/61, pulse 60, temperature 98.2 F (36.8 C), temperature source Oral, resp. rate 18, height 5\' 4"  (1.626 m), weight 185 lb 13.6 oz (84.3 kg), SpO2 98 %.  Labs: Lab Results  Component Value Date   WBC 4.6 02/16/2014   HGB 10.4* 02/16/2014   HCT 33.0* 02/16/2014   MCV 85.5 02/16/2014   PLT 140* 02/16/2014    Recent Labs Lab 02/16/14 1630  02/20/14 0359  NA 142  < > 137  K 3.7  < > 3.8  CL 102  < > 97  CO2 25  < > 25  BUN 38*  < > 34*  CREATININE 1.55*  < > 1.32*  CALCIUM 9.4  < > 9.5  PROT 7.6  --   --   BILITOT 0.3  --   --   ALKPHOS 122*  --   --   ALT 58*  --   --   AST 34  --   --   GLUCOSE 135*  < > 104*  < > = values in this interval not displayed.  Diagnostic Studies/Procedures   [name] Chest  02/18/2014   CLINICAL DATA:  Mass over the mid thoracic spine.  EXAM: CHEST  ULTRASOUND  COMPARISON:  CT chest 10/06/2013, 10/13/2005  FINDINGS: Real-time sonography of the posterior chest was performed overlying the thoracic spine. There is a 5.4 x 1.9 x 3.5 cm homogeneous mass which is isoechoic to the surrounding adipose tissue. There are a few thin internal septations. There is no nodularity.  There is no other solid or cystic mass.  IMPRESSION: 5.4 x 1.9 x 3.5 cm homogeneous mass overlying the thoracic spine within the subcutaneous fat most consistent with a lipoma which is confirmed when correlated with recent CT chest dated 10/06/2013.   Electronically Signed   By: Kathreen Devoid   On: 02/18/2014 18:21   Ir Fluoro Guide Cv Line Right  02/20/2014   CLINICAL DATA:  Chronic heart failure, continuous Milrinone therapy  EXAM: RIGHT IJ DOUBLE-LUMEN TUNNELED POWER PICC LINE PLACEMENT WITH ULTRASOUND AND FLUOROSCOPIC GUIDANCE  FLUOROSCOPY TIME:  18 SECONDS  PROCEDURE: The patient was advised of the possible risks andcomplications and agreed to undergo the procedure. The patient was then brought to the angiographic suite for the procedure.  The RIGHT NECK was prepped with chlorhexidine, drapedin the usual sterile fashion using maximum barrier technique (cap and mask, sterile gown, sterile gloves, large sterile sheet, hand hygiene and cutaneous antisepsis) and infiltrated locally with 1% Lidocaine.  Ultrasound demonstrated patency of the Right internal jugular vein, and this was documented with an image. Under real-time ultrasound guidance, this vein was accessed with a 21 gauge micropuncture needle and image documentation was performed. A 0.018 wire was introduced in to the vein. Under sterile conditions and local anesthesia, a subcutaneous tunnel was created in the right chest. The double-lumen Power PICC line was tunneled subcutaneously to the venotomy site and advanced into the SVC RA junction through a peel-away sheath. Catheter secured with an ethilon suture. The venotomy site was  closed with a subcuticular Vicryl suture and Derma bond. Fluoroscopy during the procedure and fluoro spot radiograph confirms appropriate catheter position. The catheter was flushed and covered with asterile dressing.  Catheter length: 23 cm  Complications: None immediate  IMPRESSION: Successful right IJ double-lumen tunneled power PICC line placement with ultrasound and fluoroscopic guidance. The catheter is ready for use.   Electronically Signed   By: Daryll Brod M.D.   On: 02/20/2014 14:44   Ir Fluoro Guide Cv Line Right  01/25/2014   CLINICAL DATA:  Arrhythmia  EXAM: RIGHT PICC LINE PLACEMENT WITH ULTRASOUND AND FLUOROSCOPIC GUIDANCE  FLUOROSCOPY TIME:  24 seconds.  PROCEDURE: The patient was advised of the possible risks and complications and agreed to undergo the procedure. The patient was then brought to the angiographic suite for the procedure.  The right neck was prepped with chlorhexidine, draped in the usual sterile  fashion using maximum barrier technique (cap and mask, sterile gown, sterile gloves, large sterile sheet, hand hygiene and cutaneous antisepsis) and infiltrated locally with 1% Lidocaine.  Ultrasound demonstrated patency of the right internal jugular vein, and this was documented with an image. Under real-time ultrasound guidance, this vein was accessed with a 21 gauge micropuncture needle and image documentation was performed. A 0.018 wire was introduced in to the vein. Over this, a 5 [place] single lumen Power tunneled PICC was advanced to the lower SVC/right atrial junction. The cuff was positioned in the subcutaneous tract. Fluoroscopy during the procedure and fluoro spot radiograph confirms appropriate catheter position. The catheter was flushed and covered with a sterile dressing.  COMPLICATIONS: None  LENGTH: 23 cm  IMPRESSION: Successful right internal jugular tunneled power PICC line placement with ultrasound and fluoroscopic guidance. The catheter is ready for use.    Electronically Signed   By: Maryclare Bean M.D.   On: 01/25/2014 15:19   Ir Removal Tun Cv Cath W/o Fl  02/17/2014   CLINICAL DATA:  58 year old female with cardiac arrhythmia and cardiomyopathy. A tunneled right IJ PICC was placed by interventional Radiology on 01/25/2014 for outpatient Milrinone the infusion.  The patient did well until last Tuesday when her home health nurse noted some erythema about the catheter exit site. Over the past week, the erythema has gotten slightly worse and there is now some mild drainage from the catheter exit site. This is accompanied by subjective fever and chills. The patient has no leukocytosis or fever. There is a mild erythema about the skin exit site as well as a small volume of milky drainage. No fluctuance along the tunnel.  EXAM: REMOVAL OF TUNNELED CENTRAL VENOUS CATHETER  PROCEDURE: The right chest tunneled PICC catheter site was prepped with chlorhexidine. A sterile gown and gloves were worn during the procedure.  The subcutaneous cuff was freed with gentle manual traction. The catheter was then successfully removed in its entirety. The tip was cut and sent for culture. Routine wound culture specimens were then obtained from the catheter exit site and proximal subcutaneous tunnel. A sterile dressing was applied over the catheter exit site.  COMPLICATIONS: None  IMPRESSION: Removal of tunneled PICC for possible superficial skin infection at the catheter entry site. Both the catheter tip and the skin entry site and proximal tunnel were cultured.  Signed,  Criselda Peaches, MD  [facility]  [facility]   Electronically Signed   By: Jacqulynn Cadet M.D.   On: 02/17/2014 09:15   Ir [name] Guide Vasc Access Right  02/20/2014   CLINICAL DATA:  Chronic heart failure, continuous Milrinone therapy  EXAM: RIGHT IJ DOUBLE-LUMEN TUNNELED POWER PICC LINE PLACEMENT WITH ULTRASOUND AND FLUOROSCOPIC GUIDANCE  FLUOROSCOPY TIME:  18  SECONDS  PROCEDURE: The patient was advised of the possible risks andcomplications and agreed to undergo the procedure. The patient was then brought to the angiographic suite for the procedure.  The RIGHT NECK was prepped with chlorhexidine, drapedin the usual sterile fashion using maximum barrier technique (cap and mask, sterile gown, sterile gloves, large sterile sheet, hand hygiene and cutaneous antisepsis) and infiltrated locally with 1% Lidocaine.  Ultrasound demonstrated patency of the Right internal jugular vein, and this was documented with an image. Under real-time ultrasound guidance, this vein was accessed with a 21 gauge micropuncture needle and image documentation was performed. A 0.018 wire was introduced in to the vein. Under sterile conditions and local anesthesia, a subcutaneous tunnel  was created in the right chest. The double-lumen Power PICC line was tunneled subcutaneously to the venotomy site and advanced into the SVC RA junction through a peel-away sheath. Catheter secured with an ethilon suture. The venotomy site was closed with a subcuticular Vicryl suture and Derma bond. Fluoroscopy during the procedure and fluoro spot radiograph confirms appropriate catheter position. The catheter was flushed and covered with asterile dressing.  Catheter length: 23 cm  Complications: None immediate  IMPRESSION: Successful right IJ double-lumen tunneled power PICC line placement with ultrasound and fluoroscopic guidance. The catheter is ready for use.   Electronically Signed   By: Daryll Brod M.D.   On: 02/20/2014 14:44   Ir [name] Guide Vasc Access Right  01/25/2014   CLINICAL DATA:  Arrhythmia  EXAM: RIGHT PICC LINE PLACEMENT WITH ULTRASOUND AND FLUOROSCOPIC GUIDANCE  FLUOROSCOPY TIME:  24 seconds.  PROCEDURE: The patient was advised of the possible risks and complications and agreed to undergo the procedure. The patient was then brought to the angiographic suite for the procedure.  The right neck was  prepped with chlorhexidine, draped in the usual sterile fashion using maximum barrier technique (cap and mask, sterile gown, sterile gloves, large sterile sheet, hand hygiene and cutaneous antisepsis) and infiltrated locally with 1% Lidocaine.  Ultrasound demonstrated patency of the right internal jugular vein, and this was documented with an image. Under real-time ultrasound guidance, this vein was accessed with a 21 gauge micropuncture needle and image documentation was performed. A 0.018 wire was introduced in to the vein. Over this, a 5 [place] single lumen Power tunneled PICC was advanced to the lower SVC/right atrial junction. The cuff was positioned in the subcutaneous tract. Fluoroscopy during the procedure and fluoro spot radiograph confirms appropriate catheter position. The catheter was flushed and covered with a sterile dressing.  COMPLICATIONS: None  LENGTH: 23 cm  IMPRESSION: Successful right internal jugular tunneled power PICC line placement with ultrasound and fluoroscopic guidance. The catheter is ready for use.   Electronically Signed   By: Maryclare Bean M.D.   On: 01/25/2014 15:19    Discharge Medications   Current Discharge Medication List    CONTINUE these medications which have CHANGED   Details  doxycycline (DORYX) 100 MG EC tablet Take 1 tablet (100 mg total) by mouth 2 (two) times daily. Continue for 6 more doses. Qty: 6 tablet, Refills: 0    milrinone (PRIMACOR) 20 MG/100ML SOLN infusion Inject 21.075 mcg/min into the vein continuous. Dose adjusted for actual wt 84.3kg      CONTINUE these medications which have NOT CHANGED   Details  acetaminophen (TYLENOL) 325 MG tablet Take 650 mg by mouth every 6 (six) hours as needed for mild pain.    allopurinol (ZYLOPRIM) 100 MG tablet Take 1-2 tablets (100-200 mg total) by mouth 2 (two) times daily. Takes 200 mg in the morning and 100 mg in the evening    Associated Diagnoses: Chronic systolic heart failure    amiodarone  (PACERONE) 200 MG tablet Take 1 tablet (200 mg total) by mouth daily.     aspirin EC 81 MG tablet Take 81 mg by mouth daily.    atorvastatin (LIPITOR) 80 MG tablet Take 1 tablet (80 mg total) by mouth daily.    Associated Diagnoses: Other and unspecified hyperlipidemia    digoxin (LANOXIN) 0.125 MG tablet Take 0.0625 mg by mouth every other day.    diphenhydrAMINE (BENADRYL) 25 mg capsule Take 50 mg by mouth daily as needed for allergies.  fluticasone (FLONASE) 50 MCG/ACT nasal spray Place 1 spray into both nostrils daily.    Associated Diagnoses: Throat pain    furosemide (LASIX) 40 MG tablet 80 mg (2 tablets) in the am and 40 mg (1 tablet) in pm.     insulin glargine (LANTUS) 100 UNIT/ML injection Inject 0.3 mLs (30 Units total) into the skin at bedtime.    Associated Diagnoses: Diabetes mellitus type 2, controlled    insulin lispro (HUMALOG) 100 UNIT/ML injection Inject 6 Units into the skin 3 (three) times daily as needed for high blood sugar (over 150).     losartan (COZAAR) 25 MG tablet Take 0.5 tablets (12.5 mg total) by mouth daily.     potassium chloride SA (K-DUR,KLOR-CON) 20 MEQ tablet Take 20 mEq by mouth daily.    glucose blood (ACCU-CHEK AVIVA PLUS) test strip Check blood glucose twice a day for type 2 diabetes (E11.9).     nitroGLYCERIN (NITROSTAT) 0.4 MG SL tablet Place 1 tablet (0.4 mg total) under the tongue every 5 (five) minutes as needed. For chest pain.         Disposition   The patient will be discharged in stable condition to home. Discharge Instructions    Amb Referral to Cardiac Rehabilitation    Complete by:  As directed      Diet - low sodium heart healthy    Complete by:  As directed      Increase activity slowly    Complete by:  As directed           Follow-up Information    Follow up with Glori Bickers, MD.   Specialty:  Cardiology   Why:  CHF Clinic appointment as previously scheduled 02/27/14 at 2:20pm   Contact  information:   [place] [address] [phone]         Duration of Discharge Encounter: Greater than 30 minutes including physician and PA time.  Signed, Melina Copa PA-C 02/20/2014, 3:04 PM  Patient seen and examined with Melina Copa, PA-C. We discussed all aspects of the encounter. I agree with the assessment and plan as stated above.  She is doing well. PICC line removed and replaced. Bcx negative. Continue milrinone.   Marlette Curvin,MD 3:45 PM

## 2014-02-20 NOTE — Procedures (Signed)
Successful RT IJ TUNNELED DL POWER PICC Tip SVC/RA NO COMP STABLE READY FOR USE

## 2014-02-20 NOTE — Progress Notes (Signed)
Discharge instructions given to the patient and patient verbalizes understanding.  Peripheral IV has been discontinued from right forearm.  Right forearm is reddened and swollen.  Ruben Im RN

## 2014-02-21 ENCOUNTER — Telehealth: Payer: Self-pay | Admitting: Cardiology

## 2014-02-21 ENCOUNTER — Telehealth (HOSPITAL_COMMUNITY): Payer: Self-pay | Admitting: Vascular Surgery

## 2014-02-21 DIAGNOSIS — E119 Type 2 diabetes mellitus without complications: Secondary | ICD-10-CM | POA: Diagnosis not present

## 2014-02-21 DIAGNOSIS — Z452 Encounter for adjustment and management of vascular access device: Secondary | ICD-10-CM | POA: Diagnosis not present

## 2014-02-21 DIAGNOSIS — I129 Hypertensive chronic kidney disease with stage 1 through stage 4 chronic kidney disease, or unspecified chronic kidney disease: Secondary | ICD-10-CM | POA: Diagnosis not present

## 2014-02-21 DIAGNOSIS — I5023 Acute on chronic systolic (congestive) heart failure: Secondary | ICD-10-CM | POA: Diagnosis not present

## 2014-02-21 DIAGNOSIS — I349 Nonrheumatic mitral valve disorder, unspecified: Secondary | ICD-10-CM | POA: Diagnosis not present

## 2014-02-21 DIAGNOSIS — I255 Ischemic cardiomyopathy: Secondary | ICD-10-CM | POA: Diagnosis not present

## 2014-02-21 MED ORDER — DOXYCYCLINE HYCLATE 100 MG PO TBEC: 100.0000 mg | DELAYED_RELEASE_TABLET | Freq: Two times a day (BID) | ORAL | Status: DC

## 2014-02-21 NOTE — Telephone Encounter (Signed)
Paged to report that Ms. Greear could not get doxycycline from her usually pharmacy because insurance would not cover it. She was told that the RX would be sent to the [facility] outpatient pharmacy but apparently it wasn't I attempted to send the RX to the [facility] but I am apparently not a registered e-prescriber and so I could not send the RX. I have asked her to call Dr. Clayborne Dana office in the AM to ask for the RX to be sent.  She is in agreement.

## 2014-02-21 NOTE — Telephone Encounter (Signed)
Pt called her insurance will not pay for Doxycycline.. Please advise

## 2014-02-22 LAB — CULTURE, BLOOD (ROUTINE X 2)
CULTURE: NO GROWTH
Culture: NO GROWTH

## 2014-02-22 MED ORDER — DOXYCYCLINE HYCLATE 100 MG PO TBEC: 100.0000 mg | DELAYED_RELEASE_TABLET | Freq: Two times a day (BID) | ORAL | Status: DC

## 2014-02-22 NOTE — Telephone Encounter (Signed)
Left detailed message for pt rx sent into the outpatient pharm so pt can receive via HF funds rx faxed

## 2014-02-27 ENCOUNTER — Encounter (HOSPITAL_COMMUNITY): Payer: Self-pay

## 2014-02-27 ENCOUNTER — Encounter: Payer: Self-pay | Admitting: Internal Medicine

## 2014-02-27 ENCOUNTER — Ambulatory Visit (HOSPITAL_COMMUNITY)
Admission: RE | Admit: 2014-02-27 | Discharge: 2014-02-27 | Disposition: A | Discharge status: Home / Self Care | Payer: Medicare Other | Source: Ambulatory Visit | Attending: Cardiology | Admitting: Cardiology

## 2014-02-27 VITALS — BP 100/58 | HR 64 | Wt 187.8 lb

## 2014-02-27 DIAGNOSIS — F1410 Cocaine abuse, uncomplicated: Secondary | ICD-10-CM | POA: Insufficient documentation

## 2014-02-27 DIAGNOSIS — Z794 Long term (current) use of insulin: Secondary | ICD-10-CM | POA: Diagnosis not present

## 2014-02-27 DIAGNOSIS — N183 Chronic kidney disease, stage 3 unspecified: Secondary | ICD-10-CM

## 2014-02-27 DIAGNOSIS — I25810 Atherosclerosis of coronary artery bypass graft(s) without angina pectoris: Secondary | ICD-10-CM

## 2014-02-27 DIAGNOSIS — I2510 Atherosclerotic heart disease of native coronary artery without angina pectoris: Secondary | ICD-10-CM | POA: Diagnosis not present

## 2014-02-27 DIAGNOSIS — Z951 Presence of aortocoronary bypass graft: Secondary | ICD-10-CM | POA: Insufficient documentation

## 2014-02-27 DIAGNOSIS — Z87891 Personal history of nicotine dependence: Secondary | ICD-10-CM | POA: Insufficient documentation

## 2014-02-27 DIAGNOSIS — I739 Peripheral vascular disease, unspecified: Secondary | ICD-10-CM | POA: Insufficient documentation

## 2014-02-27 DIAGNOSIS — I472 Ventricular tachycardia: Secondary | ICD-10-CM | POA: Insufficient documentation

## 2014-02-27 DIAGNOSIS — I340 Nonrheumatic mitral (valve) insufficiency: Secondary | ICD-10-CM | POA: Diagnosis not present

## 2014-02-27 DIAGNOSIS — F1021 Alcohol dependence, in remission: Secondary | ICD-10-CM | POA: Diagnosis not present

## 2014-02-27 DIAGNOSIS — I5022 Chronic systolic (congestive) heart failure: Secondary | ICD-10-CM | POA: Insufficient documentation

## 2014-02-27 DIAGNOSIS — I10 Essential (primary) hypertension: Secondary | ICD-10-CM | POA: Insufficient documentation

## 2014-02-27 DIAGNOSIS — E119 Type 2 diabetes mellitus without complications: Secondary | ICD-10-CM | POA: Diagnosis not present

## 2014-02-27 LAB — HEPATIC FUNCTION PANEL
ALBUMIN: 4.1 g/dL (ref 3.5–5.2)
ALT: 106 U/L — ABNORMAL HIGH (ref 0–35)
AST: 62 U/L — ABNORMAL HIGH (ref 0–37)
Alkaline Phosphatase: 108 U/L (ref 39–117)
BILIRUBIN DIRECT: 0.2 mg/dL (ref 0.0–0.3)
Indirect Bilirubin: 0.3 mg/dL (ref 0.3–0.9)
Total Bilirubin: 0.5 mg/dL (ref 0.3–1.2)
Total Protein: 7.8 g/dL (ref 6.0–8.3)

## 2014-02-27 LAB — CBC
HEMATOCRIT: 35.8 % — AB (ref 36.0–46.0)
HEMOGLOBIN: 11.3 g/dL — AB (ref 12.0–15.0)
MCH: 26.7 pg (ref 26.0–34.0)
MCHC: 31.6 g/dL (ref 30.0–36.0)
MCV: 84.6 fL (ref 78.0–100.0)
Platelets: 186 10*3/uL (ref 150–400)
RBC: 4.23 MIL/uL (ref 3.87–5.11)
RDW: 17.3 % — ABNORMAL HIGH (ref 11.5–15.5)
WBC: 5.2 10*3/uL (ref 4.0–10.5)

## 2014-02-27 LAB — BASIC METABOLIC PANEL
Anion gap: 12 (ref 5–15)
BUN: 30 mg/dL — ABNORMAL HIGH (ref 6–23)
CO2: 25 mmol/L (ref 19–32)
Calcium: 9.4 mg/dL (ref 8.4–10.5)
Chloride: 102 mEq/L (ref 96–112)
Creatinine, Ser: 1.63 mg/dL — ABNORMAL HIGH (ref 0.50–1.10)
GFR calc non Af Amer: 34 mL/min — ABNORMAL LOW (ref >90)
GFR, EST AFRICAN AMERICAN: 40 mL/min — AB (ref >90)
Glucose, Bld: 101 mg/dL — ABNORMAL HIGH (ref 70–99)
POTASSIUM: 4.1 mmol/L (ref 3.5–5.1)
SODIUM: 139 mmol/L (ref 135–145)

## 2014-02-27 LAB — TSH: TSH: 4.532 u[IU]/mL — AB (ref 0.350–4.500)

## 2014-02-27 LAB — DIGOXIN LEVEL: DIGOXIN LVL: 0.5 ng/mL — AB (ref 0.8–2.0)

## 2014-02-27 NOTE — Patient Instructions (Signed)
Will call you with any abnormal results from today's lab work.  Take amoxicillin before dental procedure.  Follow up with our office in 6 weeks.  Happy New Year!  Do the following things EVERYDAY: 1) Weigh yourself in the morning before breakfast. Write it down and keep it in a log. 2) Take your medicines as prescribed 3) Eat low salt foods-Limit salt (sodium) to 2000 mg per day.  4) Stay as active as you can everyday 5) Limit all fluids for the day to less than 2 liters

## 2014-02-27 NOTE — Progress Notes (Signed)
Patient ID: [name], female   DOB: 1955-12-01, 58 y.o.   MRN: [id number] PCP: Dr. Nicoletta Dress (Internal Medicine)  HPI: 58 year old female with history of HTN, DM2, past polysubstance abuse (ETOH, tobacco, cocaine), CAD s/p CABG x5 with MV annuloplasty (2007), ICM s/p ICD  (CRT upgrade 42/70), chronic systolic HF EF 62%, severe MR, CKD stage III and PAD. Blood type O+  Presented in 2007 with acute anterior MI with totalled LAD in setting of cocaine use. At time of cath LAD, LCX and RCA occluded. EF 45%. Had abrupt stent occlusion the next day and had to go back to the lab. 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   Admitted 8/2-10/07/13 for syncope s/p ICD shock. Concern that VT was possibly from ischemia and taken for [facility]. During cath patient had vagal response and BP dropped to 50s and co-ox was in the upper 40s. Placed on Levophed and transferred to floor. Started on Amiodarone.   On 11/25/13 underwent RHC for low output symptoms. Hemodynamic borderline. Swan left in and she was observed. Cardiac output dropped while in hospital so started on milrinone. Discharged home. Underwent CRT-D upgrade on 12/02/13  In 12/15, she was admitted with catheter infection. She was admitted and catheter was replaced.  She has completed antibiotic.  She has been seen at [facility] for transplant evaluation.  She remains stable on milrinone.  If she walks "slow and steady," no dyspnea.  No chest pain or lightheadedness.  She is short of breath if she walks fast or up inclines.  No orthopnea/PND.   Echos: 05/19/2012  EF 15% 2/15 EF 15%, diffuse hypokinesis, restrictive diastolic function, s/p MV repair with moderate-severe MR.  4/15: EF 15%, severe MR, mod TR  CPX: 06/17/12 Peak VO2 14.8 (predicted peak VO2 68.5%), VE/VCO2 slope 43.4 OUES: 1.19, Peak RER 1.12 Vent threshold 11.3 (pred peak VO2 52.3%)  CPX (3/15): peak VO2 15.6 (predicted peak VO2 74.5%) VE/VO2 40.8, RER 1.2  LHC  (10/2013) 1) severe native CAD with all bypass grafts patent  11/01/13 RHC RA = 5  RV = 47/3/10  PA = 54/16 (30)  PCW = 14 v = 20  Fick cardiac output/index = 4.8/2.6  Thermo CO/CI = 3.7/2.0  PVR = 4.3 WU  FA sat = 95%  PA sat = 59%, 59%   Labs:  08/12/12 - on lipitor 40 mg daily Cholesterol 180 Triglyceride 195 HDL 29 LDL 112 K 4.5, creatinine 1.4 11/16/12 K 4.0 Labs 1.5 Pro BNP 181 02/14/13: K+ 4.6, Cr 1.33, Dig level 1.8, TSH 2.97 3/15: K 4.2, creatinine 1.07, HCT 32.4 06/2013: Dig level 0.3, pro-BNP 899, Cholesterol 140, TG 106, HDL 36, LDL 82, Cr 1.5, K+ 4.9 11/11/13: K 4.1 Creatinine 1.8  01/31/14 Creatinine 1.48 K .5  02/07/14:  K 4.2 Creatinine 1.75 Magnesium 2.2 after sprionolactone 12.5 mg  was added  12/15: K 3.8, creatinine 1.32   FH: Mother deceased: CAD, DM2, HTN        Father deceased: stroke  SH: Works odds/end jobs for [employer]; disabled. Lives in [place] with 2 sons.   ROS: All systems negative except as listed in HPI, PMH and Problem List.  Past Medical History  Diagnosis Date  . Coronary artery disease     a. s/p CABG x 5 with MV annuloplasty 2007   . Diabetes mellitus   . Hypertension   . Chronic systolic heart failure  a. ICM b. ECHO (06/2013): EF 15%, severe MR (06/2013) c. RHC (10/2013): RA 5, RV 23/2/5, PA 25/6 (14), PCWP 12, Fick CO/CI: 3.2/1.7, PVR 0.7 WU, PA 45%, 47% and 55% (with levophed 5), vagal response during cath. d. On home milrinone.  . Polysubstance abuse     history of  (cocaine, tobacco and ETOH)  . V-tach   . Ischemic cardiomyopathy     a. s/p ICD. b. LHC (10/04/13): 1. Severe native CAD with all bypass grafts patent. c. CRT upgrade 12/2013.  . Implantable defibrillator   medtronic   . LBBB (left bundle branch block)   . AICD (automatic cardioverter/defibrillator) present   . PICC line infection     a. 01/2014 - PICC exchanged.  . CKD (chronic kidney disease), stage III   . PAD (peripheral artery disease)   . Lipoma     Current  Outpatient Prescriptions  Medication Sig Dispense Refill  . acetaminophen (TYLENOL) 325 MG tablet Take 650 mg by mouth every 6 (six) hours as needed for mild pain.    [name] allopurinol (ZYLOPRIM) 100 MG tablet Take 1-2 tablets (100-200 mg total) by mouth 2 (two) times daily. Takes 200 mg in the morning and 100 mg in the evening 90 tablet 3  . amiodarone (PACERONE) 200 MG tablet Take 1 tablet (200 mg total) by mouth daily. 30 tablet 3  . aspirin EC 81 MG tablet Take 81 mg by mouth daily.    [name] atorvastatin (LIPITOR) 80 MG tablet Take 1 tablet (80 mg total) by mouth daily. 30 tablet 11  . digoxin (LANOXIN) 0.125 MG tablet Take 0.0625 mg by mouth every other day.    . diphenhydrAMINE (BENADRYL) 25 mg capsule Take 50 mg by mouth daily as needed for allergies.     . fluticasone (FLONASE) 50 MCG/ACT nasal spray Place 1 spray into both nostrils daily. 16 g 2  . furosemide (LASIX) 40 MG tablet 80 mg (2 tablets) in the am and 40 mg (1 tablet) in pm. 90 tablet 3  . glucose blood (ACCU-CHEK AVIVA PLUS) test strip Check blood glucose twice a day for type 2 diabetes (E11.9). 100 each 12  . insulin glargine (LANTUS) 100 UNIT/ML injection Inject 0.3 mLs (30 Units total) into the skin at bedtime. 10 mL 11  . insulin lispro (HUMALOG) 100 UNIT/ML injection Inject 6 Units into the skin 3 (three) times daily as needed for high blood sugar (over 150).     [name] losartan (COZAAR) 25 MG tablet Take 0.5 tablets (12.5 mg total) by mouth daily. 15 tablet 3  . milrinone (PRIMACOR) 20 MG/100ML SOLN infusion Inject 21.075 mcg/min into the vein continuous.    . nitroGLYCERIN (NITROSTAT) 0.4 MG SL tablet Place 1 tablet (0.4 mg total) under the tongue every 5 (five) minutes as needed. For chest pain. 25 tablet 11  . potassium chloride SA (K-DUR,KLOR-CON) 20 MEQ tablet Take 20 mEq by mouth daily.    . [DISCONTINUED] sitaGLIPtin (JANUVIA) 25 MG tablet Take 1 tablet (25 mg total) by mouth daily. 30 tablet 1   No current  facility-administered medications for this encounter.    Filed Vitals:   02/27/14 1449  BP: 100/58  Pulse: 64  Weight: 187 lb 12.8 oz (85.186 kg)  SpO2: 95%   PHYSICAL EXAM: General:  Well appearing. No resp difficulty .  HEENT: normal Neck: supple. JVP 7   Carotids 2+ bilaterally; no bruits. No lymphadenopathy or thryomegaly appreciated. Cor: PMI normal. Regular rate & rhythm. 2/6 SEM  LSB 2/6 MR  no s3  R upper chest tunneled catheter that is tender to touch and has slight yellowish drainage Lungs: clear Abdomen: obese,soft, nontender, mildly distended. No hepatosplenomegaly. No bruits or masses. Good bowel sounds. Extremities: no cyanosis, clubbing, rash, edema. Neuro: alert & orientedx3, cranial nerves grossly intact. Moves all 4 extremities w/o difficulty. Affect pleasant.   ASSESSMENT & PLAN:  1. Chronic Systolic Heart Failure: Ischemic cardiomyopathy s/p CRT-D, EF 15% (4/15).  NYHA class II symptoms.  She was seen in transplant clinic at [facility] and will return there in 2/16.  She is blood type O so will be difficult.  I suspect that she will need LVAD prior to getting transplant.  - Continue milrinone 0.25 - Continue losartan and digoxin at current doses. Spironolactone stopped due to rise in creatinine.  - BMET and digoxin level to be checked today.  - Reinforced the need and importance of daily weights, a low sodium diet, and fluid restriction (less than 2 L a day). Instructed to call the HF clinic if weight increases more than 3 lbs overnight or 5 lbs in a week.  2. CAD: No chest pain.  Continue ASA and statin.  3. Mitral regurgitation: s/p mitral valve annuloplasty with CABG. Moderate to severe MR on echo in 4/15. TEE (06/2013) mitral regurg appears severe, anatomy of repaired valve does not look suitable for MV clipping and would be high risk for MV replacement. Continue to follow.  4. CKD stage III: Last creatinine 1.3. Continue to follow with weekly labs on milrinone.    6.  NSVT: denies any palpitations. Continue amiodarone 200 mg daily.  She needs LFTs and TSH drawn today and should get regular eye exam.    Followup in 6 wks.    Loralie Champagne 02/27/2014

## 2014-02-28 DIAGNOSIS — N183 Chronic kidney disease, stage 3 (moderate): Secondary | ICD-10-CM | POA: Diagnosis not present

## 2014-02-28 DIAGNOSIS — I482 Chronic atrial fibrillation: Secondary | ICD-10-CM | POA: Diagnosis not present

## 2014-02-28 DIAGNOSIS — E119 Type 2 diabetes mellitus without complications: Secondary | ICD-10-CM | POA: Diagnosis not present

## 2014-02-28 DIAGNOSIS — I349 Nonrheumatic mitral valve disorder, unspecified: Secondary | ICD-10-CM | POA: Diagnosis not present

## 2014-02-28 DIAGNOSIS — I25810 Atherosclerosis of coronary artery bypass graft(s) without angina pectoris: Secondary | ICD-10-CM | POA: Insufficient documentation

## 2014-02-28 DIAGNOSIS — I5022 Chronic systolic (congestive) heart failure: Secondary | ICD-10-CM | POA: Diagnosis not present

## 2014-02-28 DIAGNOSIS — I129 Hypertensive chronic kidney disease with stage 1 through stage 4 chronic kidney disease, or unspecified chronic kidney disease: Secondary | ICD-10-CM | POA: Diagnosis not present

## 2014-02-28 DIAGNOSIS — Z452 Encounter for adjustment and management of vascular access device: Secondary | ICD-10-CM | POA: Diagnosis not present

## 2014-02-28 DIAGNOSIS — I255 Ischemic cardiomyopathy: Secondary | ICD-10-CM | POA: Diagnosis not present

## 2014-02-28 DIAGNOSIS — I5023 Acute on chronic systolic (congestive) heart failure: Secondary | ICD-10-CM | POA: Diagnosis not present

## 2014-03-02 ENCOUNTER — Other Ambulatory Visit (HOSPITAL_COMMUNITY): Payer: Self-pay

## 2014-03-02 ENCOUNTER — Encounter: Payer: Self-pay | Admitting: Cardiology

## 2014-03-02 ENCOUNTER — Telehealth (HOSPITAL_COMMUNITY): Payer: Self-pay

## 2014-03-02 MED ORDER — FUROSEMIDE 80 MG PO TABS: 80.0000 mg | ORAL_TABLET | Freq: Every day | ORAL | Status: DC

## 2014-03-02 NOTE — Telephone Encounter (Signed)
Patient made aware of lasix decrease to 80mg  once daily.  Will call us Monday with weight updates to make sure she is not gaining fluid.  Enforced strict salt and fluid intake.  Renee Pain

## 2014-03-02 NOTE — Telephone Encounter (Signed)
Left message for patient to return call.  Rx changed.  Renee Pain

## 2014-03-07 ENCOUNTER — Encounter: Payer: Self-pay | Admitting: Internal Medicine

## 2014-03-07 DIAGNOSIS — I349 Nonrheumatic mitral valve disorder, unspecified: Secondary | ICD-10-CM | POA: Diagnosis not present

## 2014-03-07 DIAGNOSIS — E119 Type 2 diabetes mellitus without complications: Secondary | ICD-10-CM | POA: Diagnosis not present

## 2014-03-07 DIAGNOSIS — I255 Ischemic cardiomyopathy: Secondary | ICD-10-CM | POA: Diagnosis not present

## 2014-03-07 DIAGNOSIS — I5022 Chronic systolic (congestive) heart failure: Secondary | ICD-10-CM | POA: Diagnosis not present

## 2014-03-07 DIAGNOSIS — I5023 Acute on chronic systolic (congestive) heart failure: Secondary | ICD-10-CM | POA: Diagnosis not present

## 2014-03-07 DIAGNOSIS — Z452 Encounter for adjustment and management of vascular access device: Secondary | ICD-10-CM | POA: Diagnosis not present

## 2014-03-07 DIAGNOSIS — N183 Chronic kidney disease, stage 3 (moderate): Secondary | ICD-10-CM | POA: Diagnosis not present

## 2014-03-07 DIAGNOSIS — I482 Chronic atrial fibrillation: Secondary | ICD-10-CM | POA: Diagnosis not present

## 2014-03-07 DIAGNOSIS — I129 Hypertensive chronic kidney disease with stage 1 through stage 4 chronic kidney disease, or unspecified chronic kidney disease: Secondary | ICD-10-CM | POA: Diagnosis not present

## 2014-03-08 ENCOUNTER — Other Ambulatory Visit: Payer: Self-pay | Admitting: Internal Medicine

## 2014-03-08 DIAGNOSIS — E119 Type 2 diabetes mellitus without complications: Secondary | ICD-10-CM

## 2014-03-08 NOTE — Telephone Encounter (Signed)
I reduced her Lantus to 30 units at bedtime last visit due to low blood sugars.  I changed the prescription to reflect that change and discontinued the bottles, since she prefers the [employer].

## 2014-03-14 ENCOUNTER — Encounter: Payer: Self-pay | Admitting: Internal Medicine

## 2014-03-14 ENCOUNTER — Ambulatory Visit (INDEPENDENT_AMBULATORY_CARE_PROVIDER_SITE_OTHER): Payer: Medicare Other | Admitting: Internal Medicine

## 2014-03-14 VITALS — BP 106/68 | HR 64 | Ht 64.0 in | Wt 190.0 lb

## 2014-03-14 DIAGNOSIS — I480 Paroxysmal atrial fibrillation: Secondary | ICD-10-CM | POA: Diagnosis not present

## 2014-03-14 DIAGNOSIS — I5022 Chronic systolic (congestive) heart failure: Secondary | ICD-10-CM

## 2014-03-14 DIAGNOSIS — I5023 Acute on chronic systolic (congestive) heart failure: Secondary | ICD-10-CM

## 2014-03-14 DIAGNOSIS — I255 Ischemic cardiomyopathy: Secondary | ICD-10-CM | POA: Diagnosis not present

## 2014-03-14 DIAGNOSIS — Z452 Encounter for adjustment and management of vascular access device: Secondary | ICD-10-CM | POA: Diagnosis not present

## 2014-03-14 DIAGNOSIS — E119 Type 2 diabetes mellitus without complications: Secondary | ICD-10-CM | POA: Diagnosis not present

## 2014-03-14 DIAGNOSIS — I129 Hypertensive chronic kidney disease with stage 1 through stage 4 chronic kidney disease, or unspecified chronic kidney disease: Secondary | ICD-10-CM | POA: Diagnosis not present

## 2014-03-14 DIAGNOSIS — I349 Nonrheumatic mitral valve disorder, unspecified: Secondary | ICD-10-CM | POA: Diagnosis not present

## 2014-03-14 DIAGNOSIS — Z4502 Encounter for adjustment and management of automatic implantable cardiac defibrillator: Secondary | ICD-10-CM

## 2014-03-14 DIAGNOSIS — I447 Left bundle-branch block, unspecified: Secondary | ICD-10-CM | POA: Diagnosis not present

## 2014-03-14 DIAGNOSIS — I482 Chronic atrial fibrillation: Secondary | ICD-10-CM | POA: Diagnosis not present

## 2014-03-14 LAB — MDC_IDC_ENUM_SESS_TYPE_INCLINIC
Battery Remaining Longevity: 45 mo
Battery Voltage: 2.97 V
Brady Statistic AP VP Percent: 4.5 %
Brady Statistic AS VP Percent: 93.66 %
Brady Statistic RA Percent Paced: 4.59 %
Brady Statistic RV Percent Paced: 97.83 %
HIGH POWER IMPEDANCE MEASURED VALUE: 209 Ohm
HighPow Impedance: 36 Ohm
HighPow Impedance: 43 Ohm
Lead Channel Impedance Value: 437 Ohm
Lead Channel Impedance Value: 437 Ohm
Lead Channel Impedance Value: 494 Ohm
Lead Channel Impedance Value: 817 Ohm
Lead Channel Pacing Threshold Amplitude: 3.5 V
Lead Channel Pacing Threshold Pulse Width: 0.4 ms
Lead Channel Pacing Threshold Pulse Width: 0.4 ms
Lead Channel Pacing Threshold Pulse Width: 1 ms
Lead Channel Sensing Intrinsic Amplitude: 0.875 mV
Lead Channel Sensing Intrinsic Amplitude: 1.125 mV
Lead Channel Setting Pacing Amplitude: 2 V
Lead Channel Setting Pacing Amplitude: 4.5 V
Lead Channel Setting Sensing Sensitivity: 0.45 mV
MDC IDC MSMT LEADCHNL LV IMPEDANCE VALUE: 456 Ohm
MDC IDC MSMT LEADCHNL RA PACING THRESHOLD AMPLITUDE: 0.5 V
MDC IDC MSMT LEADCHNL RV PACING THRESHOLD AMPLITUDE: 1 V
MDC IDC MSMT LEADCHNL RV SENSING INTR AMPL: 14.375 mV
MDC IDC MSMT LEADCHNL RV SENSING INTR AMPL: 15.5 mV
MDC IDC SESS DTM: 20160112171000
MDC IDC SET LEADCHNL LV PACING PULSEWIDTH: 1 ms
MDC IDC SET LEADCHNL RV PACING AMPLITUDE: 2.5 V
MDC IDC SET LEADCHNL RV PACING PULSEWIDTH: 0.4 ms
MDC IDC SET ZONE DETECTION INTERVAL: 350 ms
MDC IDC SET ZONE DETECTION INTERVAL: 450 ms
MDC IDC STAT BRADY AP VS PERCENT: 0.09 %
MDC IDC STAT BRADY AS VS PERCENT: 1.75 %
Zone Setting Detection Interval: 290 ms
Zone Setting Detection Interval: 360 ms

## 2014-03-14 NOTE — Patient Instructions (Addendum)
Remote monitoring is used to monitor your  ICD from home. This monitoring reduces the number of office visits required to check your device to one time per year. It allows [place] to keep an eye on the functioning of your device to ensure it is working properly. You are scheduled for a device check from home on 06-13-2014. You may send your transmission at any time that day. If you have a wireless device, the transmission will be sent automatically. After your physician reviews your transmission, you will receive a postcard with your next transmission date.  Your physician recommends that you schedule a follow-up appointment in: October 2016 with [place]  Your physician recommends that you continue on your current medications as directed. Please refer to the Current Medication list given to you today.

## 2014-03-14 NOTE — Progress Notes (Signed)
Patient Care Team: Arman Filter, MD as PCP - General Deboraha Sprang, MD (Cardiology) Jolaine Artist, MD (Cardiology)   HPI  [name] is a 59 y.o. 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  Catheterization 2013-January demonstrated patent grafts cardiac index 2.2 ejection fraction 10-15%  Electrocardiogram April 2013 demonstrated a QRS duration of 124 ms repeat today is 126.  She was seen a couple of months ago for consideration of CRT upgrade for her congestive heart failure class III. There was some discrepancy as to her functional status and she was submitted for cardiopulmonary stress testing which demonstrated a low VO2 max-14.9 with the recommendation that we proceed with CRT upgrade. Initially she had just a modest prolongation of the QRS with a pattern that looked more like IVCD. CRT upgrade was deferred. However, In the fall, October, she underwent CRT upgrade following hospitalization for polymorphic ventricular tachycardia and interval worsening of the QRS duration.   Chart was reviewed. She has been on home milrinone. This was complicated by possible catheter infection. She has been seen at [facility] for transplant evaluation. There is some concern that she will need LVAD placement prior to that.   Past Medical History  Diagnosis Date  . Coronary artery disease     a. s/p CABG x 5 with MV annuloplasty 2007   . Diabetes mellitus   . Hypertension   . Chronic systolic heart failure     a. ICM b. ECHO (06/2013): EF 15%, severe MR (06/2013) c. RHC (10/2013): RA 5, RV 23/2/5, PA 25/6 (14), PCWP 12, Fick CO/CI: 3.2/1.7, PVR 0.7 WU, PA 45%, 47% and 55% (with levophed 5), vagal response during cath. d. On home milrinone.  . Polysubstance abuse     history of  (cocaine, tobacco and ETOH)  . V-tach   . Ischemic cardiomyopathy     a. s/p ICD. b. LHC (10/04/13):  1. Severe native CAD with all bypass grafts patent. c. CRT upgrade 12/2013.  . Implantable defibrillator   medtronic   . LBBB (left bundle branch block)   . AICD (automatic cardioverter/defibrillator) present   . PICC line infection     a. 01/2014 - PICC exchanged.  . CKD (chronic kidney disease), stage III   . PAD (peripheral artery disease)   . Lipoma     Past Surgical History  Procedure Laterality Date  . Coronary artery bypass graft    . Cardiac catheterization    . Cardiac defibrillator placement      2011, Followed By Dr. Caryl Comes  . Tee without cardioversion N/A 06/02/2013    Procedure: TRANSESOPHAGEAL ECHOCARDIOGRAM (TEE);  Surgeon: Larey Dresser, MD;  Location: [place];  Service: Cardiovascular;  Laterality: N/A;  . Bi-ventricular implantable cardioverter defibrillator upgrade  12/02/2013    [facility] [place] CRTD upgrade by Dr Caryl Comes  . Left and right heart catheterization with coronary/graft angiogram N/A 03/17/2011    Procedure: LEFT AND RIGHT HEART CATHETERIZATION WITH Beatrix Fetters;  Surgeon: Jolaine Artist, MD;  Location: [facility] CATH LAB;  Service: Cardiovascular;  Laterality: N/A;  . Right heart catheterization N/A 05/23/2013    Procedure: RIGHT HEART CATH;  Surgeon: Jolaine Artist, MD;  Location: [facility] CATH LAB;  Service: Cardiovascular;  Laterality: N/A;  . Left and right heart catheterization with coronary/graft angiogram N/A 10/04/2013    Procedure: LEFT AND RIGHT HEART CATHETERIZATION WITH  Beatrix Fetters;  Surgeon: Jolaine Artist, MD;  Location: [facility] CATH LAB;  Service: Cardiovascular;  Laterality: N/A;  . Right heart catheterization N/A 11/01/2013    Procedure: RIGHT HEART CATH;  Surgeon: Jolaine Artist, MD;  Location: [facility] CATH LAB;  Service: Cardiovascular;  Laterality: N/A;  . Right heart catheterization N/A 11/25/2013    Procedure: RIGHT HEART CATH;  Surgeon: Jolaine Artist, MD;  Location: [facility] CATH LAB;  Service: Cardiovascular;  Laterality: N/A;   . Bi-ventricular implantable cardioverter defibrillator upgrade N/A 12/02/2013    Procedure: BI-VENTRICULAR IMPLANTABLE CARDIOVERTER DEFIBRILLATOR UPGRADE;  Surgeon: Deboraha Sprang, MD;  Location: [facility] CATH LAB;  Service: Cardiovascular;  Laterality: N/A;    Current Outpatient Prescriptions  Medication Sig Dispense Refill  . acetaminophen (TYLENOL) 325 MG tablet Take 650 mg by mouth every 6 (six) hours as needed for mild pain.    [name] allopurinol (ZYLOPRIM) 100 MG tablet Take 1-2 tablets (100-200 mg total) by mouth 2 (two) times daily. Takes 200 mg in the morning and 100 mg in the evening 90 tablet 3  . amiodarone (PACERONE) 200 MG tablet Take 1 tablet (200 mg total) by mouth daily. 30 tablet 3  . aspirin EC 81 MG tablet Take 81 mg by mouth daily.    [name] atorvastatin (LIPITOR) 80 MG tablet Take 1 tablet (80 mg total) by mouth daily. 30 tablet 11  . digoxin (LANOXIN) 0.125 MG tablet Take 0.0625 mg by mouth every other day.    . diphenhydrAMINE (BENADRYL) 25 mg capsule Take 50 mg by mouth daily as needed for allergies.     . fluticasone (FLONASE) 50 MCG/ACT nasal spray Place 1 spray into both nostrils daily. 16 g 2  . furosemide (LASIX) 80 MG tablet Take 1 tablet (80 mg total) by mouth daily. 90 tablet 3  . glucose blood (ACCU-CHEK AVIVA PLUS) test strip Check blood glucose twice a day for type 2 diabetes (E11.9). 100 each 12  . Insulin Glargine (LANTUS SOLOSTAR) 100 UNIT/ML Solostar Pen Inject 30 Units into the skin at bedtime. 15 mL 5  . insulin lispro (HUMALOG) 100 UNIT/ML injection Inject 6 Units into the skin 3 (three) times daily as needed for high blood sugar (over 150).     [name] losartan (COZAAR) 25 MG tablet Take 0.5 tablets (12.5 mg total) by mouth daily. 15 tablet 3  . milrinone (PRIMACOR) 20 MG/100ML SOLN infusion Inject 21.075 mcg/min into the vein continuous.    . nitroGLYCERIN (NITROSTAT) 0.4 MG SL tablet Place 1 tablet (0.4 mg total) under the tongue every 5 (five) minutes as needed. For  chest pain. 25 tablet 11  . potassium chloride SA (K-DUR,KLOR-CON) 20 MEQ tablet Take 20 mEq by mouth daily.    . [DISCONTINUED] sitaGLIPtin (JANUVIA) 25 MG tablet Take 1 tablet (25 mg total) by mouth daily. 30 tablet 1   No current facility-administered medications for this visit.    No Known Allergies  Review of Systems negative except from HPI and PMH  Physical Exam BP 106/68 mmHg  Pulse 64  Ht 5\' 4"  (1.626 m)  Wt 190 lb (86.183 kg)  BMI 32.60 kg/m2 Well developed and well nourished in no acute distress HENT normal E scleral and icterus clear Neck Supple JVP flat; carotids brisk and full Clear to ausculation Device pocket well healed; without hematoma or erythema.  There is no tethering but there is a keloid which is very sensitive to touc  Regular rate and rhythm, no murmurs gallops or rub Soft  with active bowel sounds No clubbing cyanosis no Edema Alert and oriented, grossly normal motor and sensory function Skin Warm and Dry  ECG today demonstrates sinus rhythm at 64 intervals  P-synchronous/ BiV  pacing   Assessment and  Plan  Nonischemic/valvular cardiomyopathy  Coronary artery disease Without symptoms of ischemia  LBBB  CHF chronic systolic   Keloid   Implantable defibrillator-Medtronic _ CRT The patient's device was interrogated.  The information was reviewed. No changes were made in the programming.     The patient is euvolemic. Her blood pressure remains low precluding dose up titration. Last digoxin level was 0.3. This could potentially be increased.   The keloid is quite painful with hypersensitivity  I have been in contact with Dr Marica Otter plastics to see if he might be able to help [place] with this

## 2014-03-15 ENCOUNTER — Encounter: Payer: Self-pay | Admitting: Internal Medicine

## 2014-03-16 ENCOUNTER — Encounter (HOSPITAL_COMMUNITY): Payer: Self-pay | Admitting: Internal Medicine

## 2014-03-21 DIAGNOSIS — I482 Chronic atrial fibrillation: Secondary | ICD-10-CM | POA: Diagnosis not present

## 2014-03-21 DIAGNOSIS — I255 Ischemic cardiomyopathy: Secondary | ICD-10-CM | POA: Diagnosis not present

## 2014-03-21 DIAGNOSIS — I349 Nonrheumatic mitral valve disorder, unspecified: Secondary | ICD-10-CM | POA: Diagnosis not present

## 2014-03-21 DIAGNOSIS — Z452 Encounter for adjustment and management of vascular access device: Secondary | ICD-10-CM | POA: Diagnosis not present

## 2014-03-21 DIAGNOSIS — I129 Hypertensive chronic kidney disease with stage 1 through stage 4 chronic kidney disease, or unspecified chronic kidney disease: Secondary | ICD-10-CM | POA: Diagnosis not present

## 2014-03-21 DIAGNOSIS — I5022 Chronic systolic (congestive) heart failure: Secondary | ICD-10-CM | POA: Diagnosis not present

## 2014-03-21 DIAGNOSIS — E119 Type 2 diabetes mellitus without complications: Secondary | ICD-10-CM | POA: Diagnosis not present

## 2014-03-21 DIAGNOSIS — I5023 Acute on chronic systolic (congestive) heart failure: Secondary | ICD-10-CM | POA: Diagnosis not present

## 2014-03-27 ENCOUNTER — Encounter: Payer: Self-pay | Admitting: Internal Medicine

## 2014-03-28 DIAGNOSIS — I5023 Acute on chronic systolic (congestive) heart failure: Secondary | ICD-10-CM | POA: Diagnosis not present

## 2014-03-28 DIAGNOSIS — E119 Type 2 diabetes mellitus without complications: Secondary | ICD-10-CM | POA: Diagnosis not present

## 2014-03-28 DIAGNOSIS — I129 Hypertensive chronic kidney disease with stage 1 through stage 4 chronic kidney disease, or unspecified chronic kidney disease: Secondary | ICD-10-CM | POA: Diagnosis not present

## 2014-03-28 DIAGNOSIS — I5022 Chronic systolic (congestive) heart failure: Secondary | ICD-10-CM | POA: Diagnosis not present

## 2014-03-28 DIAGNOSIS — I349 Nonrheumatic mitral valve disorder, unspecified: Secondary | ICD-10-CM | POA: Diagnosis not present

## 2014-03-28 DIAGNOSIS — I255 Ischemic cardiomyopathy: Secondary | ICD-10-CM | POA: Diagnosis not present

## 2014-03-28 DIAGNOSIS — Z452 Encounter for adjustment and management of vascular access device: Secondary | ICD-10-CM | POA: Diagnosis not present

## 2014-03-28 DIAGNOSIS — I482 Chronic atrial fibrillation: Secondary | ICD-10-CM | POA: Diagnosis not present

## 2014-03-29 ENCOUNTER — Telehealth: Payer: Self-pay | Admitting: Licensed Clinical Social Worker

## 2014-03-29 ENCOUNTER — Telehealth (HOSPITAL_COMMUNITY): Payer: Self-pay | Admitting: Vascular Surgery

## 2014-03-29 MED ORDER — DIGOXIN 125 MCG PO TABS: 0.0625 mg | ORAL_TABLET | ORAL | Status: DC

## 2014-03-29 NOTE — Telephone Encounter (Signed)
Refill Digoxin

## 2014-03-29 NOTE — Telephone Encounter (Signed)
CSW referred to start the LVAD psychosocial assessment by Zada Girt, [place] Coordinator. CSW contacted patient by phone to schedule visit. Patient is available on Monday February 1 @ 9:15am. CSW will meet with patient at that time. [name], [place]

## 2014-03-30 DIAGNOSIS — I255 Ischemic cardiomyopathy: Secondary | ICD-10-CM | POA: Diagnosis not present

## 2014-03-30 DIAGNOSIS — I2510 Atherosclerotic heart disease of native coronary artery without angina pectoris: Secondary | ICD-10-CM | POA: Diagnosis not present

## 2014-03-30 DIAGNOSIS — I129 Hypertensive chronic kidney disease with stage 1 through stage 4 chronic kidney disease, or unspecified chronic kidney disease: Secondary | ICD-10-CM | POA: Diagnosis not present

## 2014-03-30 DIAGNOSIS — I349 Nonrheumatic mitral valve disorder, unspecified: Secondary | ICD-10-CM | POA: Diagnosis not present

## 2014-03-30 DIAGNOSIS — Z87891 Personal history of nicotine dependence: Secondary | ICD-10-CM | POA: Diagnosis not present

## 2014-03-30 DIAGNOSIS — E119 Type 2 diabetes mellitus without complications: Secondary | ICD-10-CM | POA: Diagnosis not present

## 2014-03-30 DIAGNOSIS — Z95810 Presence of automatic (implantable) cardiac defibrillator: Secondary | ICD-10-CM | POA: Diagnosis not present

## 2014-03-30 DIAGNOSIS — M109 Gout, unspecified: Secondary | ICD-10-CM | POA: Diagnosis not present

## 2014-03-30 DIAGNOSIS — N183 Chronic kidney disease, stage 3 (moderate): Secondary | ICD-10-CM | POA: Diagnosis not present

## 2014-03-30 DIAGNOSIS — Z452 Encounter for adjustment and management of vascular access device: Secondary | ICD-10-CM | POA: Diagnosis not present

## 2014-03-30 DIAGNOSIS — I5023 Acute on chronic systolic (congestive) heart failure: Secondary | ICD-10-CM | POA: Diagnosis not present

## 2014-03-31 ENCOUNTER — Other Ambulatory Visit: Payer: Self-pay | Admitting: *Deleted

## 2014-03-31 DIAGNOSIS — E119 Type 2 diabetes mellitus without complications: Secondary | ICD-10-CM

## 2014-03-31 MED ORDER — INSULIN PEN NEEDLE 31G X 8 MM MISC: 1.0000 | Freq: Three times a day (TID) | Status: DC

## 2014-04-03 ENCOUNTER — Encounter: Payer: Self-pay | Admitting: Licensed Clinical Social Worker

## 2014-04-04 DIAGNOSIS — I255 Ischemic cardiomyopathy: Secondary | ICD-10-CM | POA: Diagnosis not present

## 2014-04-04 DIAGNOSIS — I2510 Atherosclerotic heart disease of native coronary artery without angina pectoris: Secondary | ICD-10-CM | POA: Diagnosis not present

## 2014-04-04 DIAGNOSIS — I129 Hypertensive chronic kidney disease with stage 1 through stage 4 chronic kidney disease, or unspecified chronic kidney disease: Secondary | ICD-10-CM | POA: Diagnosis not present

## 2014-04-04 DIAGNOSIS — I349 Nonrheumatic mitral valve disorder, unspecified: Secondary | ICD-10-CM | POA: Diagnosis not present

## 2014-04-04 DIAGNOSIS — Z452 Encounter for adjustment and management of vascular access device: Secondary | ICD-10-CM | POA: Diagnosis not present

## 2014-04-04 DIAGNOSIS — Z95810 Presence of automatic (implantable) cardiac defibrillator: Secondary | ICD-10-CM | POA: Diagnosis not present

## 2014-04-04 DIAGNOSIS — I482 Chronic atrial fibrillation: Secondary | ICD-10-CM | POA: Diagnosis not present

## 2014-04-04 DIAGNOSIS — I739 Peripheral vascular disease, unspecified: Secondary | ICD-10-CM | POA: Diagnosis not present

## 2014-04-04 DIAGNOSIS — I5023 Acute on chronic systolic (congestive) heart failure: Secondary | ICD-10-CM | POA: Diagnosis not present

## 2014-04-04 DIAGNOSIS — I5022 Chronic systolic (congestive) heart failure: Secondary | ICD-10-CM | POA: Diagnosis not present

## 2014-04-04 DIAGNOSIS — E119 Type 2 diabetes mellitus without complications: Secondary | ICD-10-CM | POA: Diagnosis not present

## 2014-04-06 NOTE — Progress Notes (Signed)
CSW met with patient to complete psychosocial LVAD assessment. Patient's sister was identified as her primary caregiver and unavailable at the time of today's assessment. Patient will discuss with sister and arrange for return visit with sister to complete LVAD assessment. Full assessment to follow pending completion of caregiver assessment. [name], [place]

## 2014-04-07 ENCOUNTER — Ambulatory Visit (HOSPITAL_COMMUNITY)
Admission: RE | Admit: 2014-04-07 | Discharge: 2014-04-07 | Disposition: A | Discharge status: Home / Self Care | Payer: Medicare Other | Source: Ambulatory Visit | Attending: Internal Medicine | Admitting: Internal Medicine

## 2014-04-07 VITALS — BP 90/50 | HR 88 | Wt 188.0 lb

## 2014-04-07 DIAGNOSIS — I4729 Other ventricular tachycardia: Secondary | ICD-10-CM

## 2014-04-07 DIAGNOSIS — N183 Chronic kidney disease, stage 3 unspecified: Secondary | ICD-10-CM

## 2014-04-07 DIAGNOSIS — I5022 Chronic systolic (congestive) heart failure: Secondary | ICD-10-CM | POA: Diagnosis not present

## 2014-04-07 DIAGNOSIS — I340 Nonrheumatic mitral (valve) insufficiency: Secondary | ICD-10-CM | POA: Diagnosis not present

## 2014-04-07 DIAGNOSIS — I472 Ventricular tachycardia: Secondary | ICD-10-CM

## 2014-04-07 DIAGNOSIS — I2510 Atherosclerotic heart disease of native coronary artery without angina pectoris: Secondary | ICD-10-CM | POA: Diagnosis not present

## 2014-04-07 DIAGNOSIS — L910 Hypertrophic scar: Secondary | ICD-10-CM | POA: Diagnosis not present

## 2014-04-07 NOTE — Patient Instructions (Addendum)
Will refer you to Dr. Theodoro Kos with [facility] for keloid evaluation and treatment. [facility]  [address]  [place], [place] [address]  Main: [phone]  Appointment date and time:   Tuesday February 9th at 10:45  Follow up 6 weeks.  Do the following things EVERYDAY: 1) Weigh yourself in the morning before breakfast. Write it down and keep it in a log. 2) Take your medicines as prescribed 3) Eat low salt foods-Limit salt (sodium) to 2000 mg per day.  4) Stay as active as you can everyday 5) Limit all fluids for the day to less than 2 liters

## 2014-04-07 NOTE — Progress Notes (Signed)
Patient ID: [name], female   DOB: Sep 29, 1955, 59 y.o.   MRN: [id number] PCP: Dr. Nicoletta Dress (Internal Medicine)  HPI: 60 year old female with history of HTN, DM2, past polysubstance abuse (ETOH, tobacco, cocaine), CAD s/p CABG x5 with MV annuloplasty (2007), ICM s/p ICD  (CRT upgrade 00/76), chronic systolic HF EF 22%, severe MR, CKD stage III and PAD. Blood type O+  Presented in 2007 with acute anterior MI with totalled LAD in setting of cocaine use. At time of cath LAD, LCX and RCA occluded. EF 45%. Had abrupt stent occlusion the next day and had to go back to the lab. 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   Admitted 8/2-10/07/13 for syncope s/p ICD shock. Concern that VT was possibly from ischemia and taken for [facility]. During cath patient had vagal response and BP dropped to 50s and co-ox was in the upper 40s. Placed on Levophed and transferred to floor. Started on Amiodarone.   On 11/25/13 underwent RHC for low output symptoms. Hemodynamic borderline. Swan left in and she was observed. Cardiac output dropped while in hospital so started on milrinone. Discharged home. Underwent CRT-D upgrade on 12/02/13  In 12/15, she was admitted with catheter infection. She was admitted and catheter was replaced.  She has completed antibiotic.    She returns for follow up. Had follow up at at [facility] for transplant with biggest concern for LVAD is development of keloids.  Mild dyspnea with exertion. Denies orthopnea/pnd/CP. Taking all medications. Weight at home 188-189 pounds.    Echos: 05/19/2012  EF 15% 2/15 EF 15%, diffuse hypokinesis, restrictive diastolic function, s/p MV repair with moderate-severe MR.  4/15: EF 15%, severe MR, mod TR  CPX: 06/17/12 Peak VO2 14.8 (predicted peak VO2 68.5%), VE/VCO2 slope 43.4 OUES: 1.19, Peak RER 1.12 Vent threshold 11.3 (pred peak VO2 52.3%)  CPX (3/15): peak VO2 15.6 (predicted peak VO2 74.5%) VE/VO2 40.8, RER 1.2  LHC  (10/2013) 1) severe native CAD with all bypass grafts patent  11/01/13 RHC RA = 5  RV = 47/3/10  PA = 54/16 (30)  PCW = 14 v = 20  Fick cardiac output/index = 4.8/2.6  Thermo CO/CI = 3.7/2.0  PVR = 4.3 WU  FA sat = 95%  PA sat = 59%, 59%   Labs:  08/12/12 - on lipitor 40 mg daily Cholesterol 180 Triglyceride 195 HDL 29 LDL 112 K 4.5, creatinine 1.4 11/16/12 K 4.0 Labs 1.5 Pro BNP 181 02/14/13: K+ 4.6, Cr 1.33, Dig level 1.8, TSH 2.97 3/15: K 4.2, creatinine 1.07, HCT 32.4 06/2013: Dig level 0.3, pro-BNP 899, Cholesterol 140, TG 106, HDL 36, LDL 82, Cr 1.5, K+ 4.9 11/11/13: K 4.1 Creatinine 1.8  01/31/14 Creatinine 1.48 K .5  02/07/14:  K 4.2 Creatinine 1.75 Magnesium 2.2 after sprionolactone 12.5 mg  was added  12/15: K 3.8, creatinine 1.32  04/04/2014: K 4.8 Creatinine 1.59  FH: Mother deceased: CAD, DM2, HTN        Father deceased: stroke  SH: Works odds/end jobs for [employer]; disabled. Lives in [place] with 2 sons([name] and [name]).   ROS: All systems negative except as listed in HPI, PMH and Problem List.  Past Medical History  Diagnosis Date  . Coronary artery disease     a. s/p CABG x 5 with MV annuloplasty 2007   . Diabetes mellitus   . Hypertension   . Chronic systolic heart failure  a. ICM b. ECHO (06/2013): EF 15%, severe MR (06/2013) c. RHC (10/2013): RA 5, RV 23/2/5, PA 25/6 (14), PCWP 12, Fick CO/CI: 3.2/1.7, PVR 0.7 WU, PA 45%, 47% and 55% (with levophed 5), vagal response during cath. d. On home milrinone.  . Polysubstance abuse     history of  (cocaine, tobacco and ETOH)  . V-tach   . Ischemic cardiomyopathy     a. s/p ICD. b. LHC (10/04/13): 1. Severe native CAD with all bypass grafts patent. c. CRT upgrade 12/2013.  . Implantable defibrillator   medtronic   . LBBB (left bundle branch block)   . AICD (automatic cardioverter/defibrillator) present   . PICC line infection     a. 01/2014 - PICC exchanged.  . CKD (chronic kidney disease), stage III   . PAD  (peripheral artery disease)   . Lipoma     Current Outpatient Prescriptions  Medication Sig Dispense Refill  . acetaminophen (TYLENOL) 325 MG tablet Take 650 mg by mouth every 6 (six) hours as needed for mild pain.    [name] allopurinol (ZYLOPRIM) 100 MG tablet Take 1-2 tablets (100-200 mg total) by mouth 2 (two) times daily. Takes 200 mg in the morning and 100 mg in the evening 90 tablet 3  . amiodarone (PACERONE) 200 MG tablet Take 1 tablet (200 mg total) by mouth daily. 30 tablet 3  . aspirin EC 81 MG tablet Take 81 mg by mouth daily.    [name] atorvastatin (LIPITOR) 80 MG tablet Take 1 tablet (80 mg total) by mouth daily. 30 tablet 11  . digoxin (LANOXIN) 0.125 MG tablet Take 0.5 tablets (0.0625 mg total) by mouth every other day. 15 tablet 3  . diphenhydrAMINE (BENADRYL) 25 mg capsule Take 50 mg by mouth daily as needed for allergies.     . fluticasone (FLONASE) 50 MCG/ACT nasal spray Place 1 spray into both nostrils daily. 16 g 2  . furosemide (LASIX) 80 MG tablet Take 1 tablet (80 mg total) by mouth daily. (Patient taking differently: Take 80 mg by mouth daily. With extra 40 mg as needed) 90 tablet 3  . glucose blood (ACCU-CHEK AVIVA PLUS) test strip Check blood glucose twice a day for type 2 diabetes (E11.9). 100 each 12  . Insulin Glargine (LANTUS SOLOSTAR) 100 UNIT/ML Solostar Pen Inject 30 Units into the skin at bedtime. 15 mL 5  . insulin lispro (HUMALOG) 100 UNIT/ML injection Inject 6 Units into the skin 3 (three) times daily as needed for high blood sugar (over 150).     . Insulin Pen Needle 31G X 8 MM MISC 1 each by Does not apply route 3 (three) times daily. Inject insulin 3 times daily.  Insulin dependent DM E11.9. 100 each 11  . losartan (COZAAR) 25 MG tablet Take 0.5 tablets (12.5 mg total) by mouth daily. 15 tablet 3  . milrinone (PRIMACOR) 20 MG/100ML SOLN infusion Inject 21.075 mcg/min into the vein continuous.    . nitroGLYCERIN (NITROSTAT) 0.4 MG SL tablet Place 1 tablet (0.4 mg  total) under the tongue every 5 (five) minutes as needed. For chest pain. 25 tablet 11  . potassium chloride SA (K-DUR,KLOR-CON) 20 MEQ tablet Take 20 mEq by mouth daily.    . [DISCONTINUED] sitaGLIPtin (JANUVIA) 25 MG tablet Take 1 tablet (25 mg total) by mouth daily. 30 tablet 1   No current facility-administered medications for this encounter.    Filed Vitals:   04/09/14 1725  BP: 90/50  Pulse: 88  Weight: 188 lb (  85.276 kg)  SpO2: 98%   PHYSICAL EXAM: General:  Well appearing. No resp difficulty .  HEENT: normal Neck: supple. JVP 7   Carotids 2+ bilaterally; no bruits. No lymphadenopathy or thryomegaly appreciated. Cor: PMI normal. Regular rate & rhythm. 2/6 SEM LSB 2/6 MR  no s3  R upper chest tunneled catheter that is tender to touch and has slight yellowish drainage Lungs: clear Abdomen: obese,soft, nontender, mildly distended. No hepatosplenomegaly. No bruits or masses. Good bowel sounds. Extremities: no cyanosis, clubbing, rash, edema. Neuro: alert & orientedx3, cranial nerves grossly intact. Moves all 4 extremities w/o difficulty. Affect pleasant.   ASSESSMENT & PLAN:  1. Chronic Systolic Heart Failure: Ischemic cardiomyopathy s/p CRT-D, EF 15% (4/15).  NYHA class II symptoms.  She was seen in transplant clinic at [facility] last week and the main concern is keloids if LVAD pursued. I will refer to Dr Migdalia Dk.  She is blood type O so will be difficult.  I suspect that she will need LVAD prior to getting transplant. For now she is stable on Milrinone NYHA IIId - Continue milrinone 0.25 mcg per [facility].  - Continue losartan and digoxin at current doses.  -Not on Spironolactone due to rise in creatinine.  Renal function ok from this week.   - Reinforced the need and importance of daily weights, a low sodium diet, and fluid restriction (less than 2 L a day). Instructed to call the HF clinic if weight increases more than 3 lbs overnight or 5 lbs in a week.  2. CAD: No chest pain.  Continue  ASA and statin.  3. Mitral regurgitation: s/p mitral valve annuloplasty with CABG. Moderate to severe MR on echo in 4/15. TEE (06/2013) mitral regurg appears severe, anatomy of repaired valve does not look suitable for MV clipping and would be high risk for MV replacement.  4. CKD stage III: Last creatinine 1.5. Continue to follow with weekly labs on milrinone.    6. NSVT: denies any palpitations. Continue amiodarone 200 mg daily. She understands she needs yearly eye exams. TSH ok 01/2014   Keloids-  I called Dr Migdalia Dk, Plastic Surgeon regarding keloids and she is agreeable to assess and make recommendations.  Also to discuss complications for keloids if she needs LVAD.       Follow up in 6 wks.    CLEGG,AMY NP-C  04/07/2014

## 2014-04-09 DIAGNOSIS — L910 Hypertrophic scar: Secondary | ICD-10-CM | POA: Insufficient documentation

## 2014-04-10 ENCOUNTER — Encounter: Payer: Self-pay | Admitting: Internal Medicine

## 2014-04-11 ENCOUNTER — Telehealth (HOSPITAL_COMMUNITY): Payer: Self-pay | Admitting: Cardiology

## 2014-04-11 ENCOUNTER — Telehealth: Payer: Self-pay | Admitting: Cardiology

## 2014-04-11 DIAGNOSIS — E119 Type 2 diabetes mellitus without complications: Secondary | ICD-10-CM | POA: Diagnosis not present

## 2014-04-11 DIAGNOSIS — I482 Chronic atrial fibrillation: Secondary | ICD-10-CM | POA: Diagnosis not present

## 2014-04-11 DIAGNOSIS — I255 Ischemic cardiomyopathy: Secondary | ICD-10-CM | POA: Diagnosis not present

## 2014-04-11 DIAGNOSIS — L910 Hypertrophic scar: Secondary | ICD-10-CM | POA: Diagnosis not present

## 2014-04-11 DIAGNOSIS — I129 Hypertensive chronic kidney disease with stage 1 through stage 4 chronic kidney disease, or unspecified chronic kidney disease: Secondary | ICD-10-CM | POA: Diagnosis not present

## 2014-04-11 DIAGNOSIS — I5022 Chronic systolic (congestive) heart failure: Secondary | ICD-10-CM | POA: Diagnosis not present

## 2014-04-11 DIAGNOSIS — Z452 Encounter for adjustment and management of vascular access device: Secondary | ICD-10-CM | POA: Diagnosis not present

## 2014-04-11 DIAGNOSIS — I5023 Acute on chronic systolic (congestive) heart failure: Secondary | ICD-10-CM | POA: Diagnosis not present

## 2014-04-11 DIAGNOSIS — I349 Nonrheumatic mitral valve disorder, unspecified: Secondary | ICD-10-CM | POA: Diagnosis not present

## 2014-04-11 NOTE — Telephone Encounter (Signed)
-----   Message from Patsey Berthold, RN sent at 01/08/2014  7:29 AM EST ----- CRTD upgrade for [facility] clinic

## 2014-04-11 NOTE — Telephone Encounter (Signed)
LMOVM for pt to return call 

## 2014-04-11 NOTE — Telephone Encounter (Signed)
Amy called after pts. [place] visit PICC line site was tender, very small amount of yellow drainage present in dressing, and the sutures are no longer present  Amy cleaned PICC line site and changed dressing Advised to keep close watch for increased tenderness, increased drainage, dislodgment, warmth to touch, fever etc

## 2014-04-12 ENCOUNTER — Telehealth (HOSPITAL_COMMUNITY): Payer: Self-pay | Admitting: Vascular Surgery

## 2014-04-12 ENCOUNTER — Ambulatory Visit (HOSPITAL_COMMUNITY)
Admission: RE | Admit: 2014-04-12 | Discharge: 2014-04-12 | Disposition: A | Discharge status: Home / Self Care | Payer: Medicare Other | Source: Ambulatory Visit | Attending: Internal Medicine | Admitting: Internal Medicine

## 2014-04-12 DIAGNOSIS — Z452 Encounter for adjustment and management of vascular access device: Secondary | ICD-10-CM | POA: Diagnosis not present

## 2014-04-12 DIAGNOSIS — I129 Hypertensive chronic kidney disease with stage 1 through stage 4 chronic kidney disease, or unspecified chronic kidney disease: Secondary | ICD-10-CM | POA: Diagnosis not present

## 2014-04-12 DIAGNOSIS — I255 Ischemic cardiomyopathy: Secondary | ICD-10-CM | POA: Diagnosis not present

## 2014-04-12 DIAGNOSIS — E119 Type 2 diabetes mellitus without complications: Secondary | ICD-10-CM | POA: Diagnosis not present

## 2014-04-12 DIAGNOSIS — I509 Heart failure, unspecified: Secondary | ICD-10-CM | POA: Diagnosis not present

## 2014-04-12 DIAGNOSIS — T80219A Unspecified infection due to central venous catheter, initial encounter: Secondary | ICD-10-CM

## 2014-04-12 DIAGNOSIS — I5023 Acute on chronic systolic (congestive) heart failure: Secondary | ICD-10-CM | POA: Diagnosis not present

## 2014-04-12 DIAGNOSIS — I349 Nonrheumatic mitral valve disorder, unspecified: Secondary | ICD-10-CM | POA: Diagnosis not present

## 2014-04-12 DIAGNOSIS — N183 Chronic kidney disease, stage 3 (moderate): Secondary | ICD-10-CM | POA: Diagnosis not present

## 2014-04-12 MED ORDER — LIDOCAINE HCL 1 % IJ SOLN
INTRAMUSCULAR | Status: AC
  Filled 2014-04-12: qty 20

## 2014-04-12 MED ORDER — CHLORHEXIDINE GLUCONATE 4 % EX LIQD
CUTANEOUS | Status: AC
  Filled 2014-04-12: qty 15

## 2014-04-12 NOTE — Progress Notes (Signed)
Patient with tunneled right IJ 6Fr central catheter placed 02/20/14 for CHF and need for milrinone therapy. Request has been made for removal of catheter with concern for infection. Successful removal of central catheter with no immediate complications, tip of catheter sent for culture and sterile dressing placed.  Tsosie Billing PA-C Interventional Radiology  04/12/14  2:43 PM

## 2014-04-12 NOTE — Telephone Encounter (Signed)
Per Vo Dr.Bensimhon, Pt should have PICC/tunneld cath line removed and do not replace at this time. Will see how patient does off home inotropics, if we need to re start at any time may consider a port a cath    picc LINE REMOVAL 04/12/14 @ 1:30 PM per Anderson Malta with IR  Amy,RN [facility]--detailed message left, D/C meds, blood cultures x 2 Pt aware

## 2014-04-12 NOTE — Addendum Note (Signed)
Addended by: Kerry Dory on: 04/12/2014 01:21 PM   Modules accepted: Orders

## 2014-04-12 NOTE — Telephone Encounter (Signed)
Will review with provider.

## 2014-04-12 NOTE — Telephone Encounter (Signed)
[facility] called pt PICC line is very painful, she feels like its coming out and pulling, the PICC site is white... Please advise

## 2014-04-14 ENCOUNTER — Telehealth (HOSPITAL_COMMUNITY): Payer: Self-pay | Admitting: Vascular Surgery

## 2014-04-14 NOTE — Telephone Encounter (Signed)
[facility] called pt weight is 187, PICC is out, she is feeling good, pt is off the milrnone , Nurse wants to know if she needs to be seen sooner than March. Amy wants to continue see her 1 time a week.. Please advise

## 2014-04-14 NOTE — Telephone Encounter (Signed)
Advised patient can keep March appointment.  Patient has a compliant history with her appointments, medications, and contacting us with any issues.  Amy will continue to visit patient weekly in the home to ensure she is stable off of milrinone drip.  Renee Pain

## 2014-04-16 DIAGNOSIS — E119 Type 2 diabetes mellitus without complications: Secondary | ICD-10-CM | POA: Diagnosis not present

## 2014-04-16 DIAGNOSIS — I255 Ischemic cardiomyopathy: Secondary | ICD-10-CM | POA: Diagnosis not present

## 2014-04-16 DIAGNOSIS — I5023 Acute on chronic systolic (congestive) heart failure: Secondary | ICD-10-CM | POA: Diagnosis not present

## 2014-04-16 DIAGNOSIS — I349 Nonrheumatic mitral valve disorder, unspecified: Secondary | ICD-10-CM | POA: Diagnosis not present

## 2014-04-16 DIAGNOSIS — Z452 Encounter for adjustment and management of vascular access device: Secondary | ICD-10-CM | POA: Diagnosis not present

## 2014-04-16 DIAGNOSIS — I129 Hypertensive chronic kidney disease with stage 1 through stage 4 chronic kidney disease, or unspecified chronic kidney disease: Secondary | ICD-10-CM | POA: Diagnosis not present

## 2014-04-16 LAB — CATH TIP CULTURE: CULTURE: NO GROWTH

## 2014-04-18 LAB — CULTURE, BLOOD (ROUTINE X 2): CULTURE: NO GROWTH

## 2014-04-21 ENCOUNTER — Ambulatory Visit (INDEPENDENT_AMBULATORY_CARE_PROVIDER_SITE_OTHER): Payer: Medicare Other | Admitting: Internal Medicine

## 2014-04-21 ENCOUNTER — Encounter: Payer: Self-pay | Admitting: Internal Medicine

## 2014-04-21 VITALS — BP 107/58 | HR 56 | Temp 97.6°F | Ht 64.0 in | Wt 193.4 lb

## 2014-04-21 DIAGNOSIS — Z794 Long term (current) use of insulin: Secondary | ICD-10-CM

## 2014-04-21 DIAGNOSIS — R195 Other fecal abnormalities: Secondary | ICD-10-CM

## 2014-04-21 DIAGNOSIS — E119 Type 2 diabetes mellitus without complications: Secondary | ICD-10-CM

## 2014-04-21 DIAGNOSIS — I5022 Chronic systolic (congestive) heart failure: Secondary | ICD-10-CM

## 2014-04-21 LAB — GLUCOSE, CAPILLARY: Glucose-Capillary: 71 mg/dL (ref 70–99)

## 2014-04-21 LAB — POCT GLYCOSYLATED HEMOGLOBIN (HGB A1C): Hemoglobin A1C: 6.5

## 2014-04-21 MED ORDER — INSULIN LISPRO 100 UNIT/ML (KWIKPEN): 5.0000 [IU] | PEN_INJECTOR | Freq: Three times a day (TID) | SUBCUTANEOUS | Status: DC

## 2014-04-21 MED ORDER — GLUCOSE BLOOD VI STRP: ORAL_STRIP | Status: DC

## 2014-04-21 NOTE — Progress Notes (Signed)
Subjective:    Patient ID: [name], female    DOB: 12-11-1955, 59 y.o.   MRN: [id number]  HPI [name] is a 59 year old woman with history of polysubstance abuse, coronary artery disease status post CABG 5 and mitral annuloplasty in 2007, ischemic cardiomyopathy with congestive heart failure (ejection fraction 15%), and type 2 diabetes presenting for routine follow-up.  She was discharged in the hospital on 02/20/2014 after being hospitalized for suspected PICC line infection. She had the PICC placed for milrinone infusion, and it was replaced during the hospitalization. She continued on milrinone infusion until approximately 2 weeks ago, when her PICC line was removed. She is continuing to be seen by her home health nurse, and she has been doing well off of milrinone. She is scheduled to see heart failure clinic again on 05/19/2014. She has been undergoing the evaluation process for heart transplant [facility], and it appears she will need to have a LVAD placed to bridge her to transplant. Currently, the main concern with an LVAD is keloids that'll develop post surgery. She had keloids after placement of her PICC line.  She saw Dr. Tobias Alexander of plastic surgery for her recommendations who recommended getting some Mederma.   She reports eating some fast food last night and noticing an increase in her weight this morning associated with very mild shortness of breath. She plans to take an extra dose of her Lasix this evening, which typically resolves her symptoms.  Her blood sugars have been running between 100-150 before meals without any lows. She reports running out of her testing strips, and she is requesting Humalog pens instead of vitals for her pre-meal insulin.  She was having post-nasal drip at her last appointment that has resolved, and she has not been taking Flonase or Benadryl.  She was screened for colon cancer with FOBT cards, and 2 out of 3 cards were positive last fall. Her  cardiologist, Dr. Haroldine Laws, has been reluctant to sign off on a colonoscopy given her heart failure. However, she reports needing a colonoscopy prior to undergoing heart transplant.  Review of Systems  Constitutional: Negative for fever, chills and fatigue.  HENT: Negative for congestion, rhinorrhea and sore throat.   Respiratory: Positive for shortness of breath. Negative for cough, chest tightness and wheezing.   Gastrointestinal: Positive for abdominal distention (Mild from heart failure.). Negative for nausea, vomiting, abdominal pain, diarrhea, constipation and blood in stool.  Genitourinary: Negative for dysuria and difficulty urinating.  Musculoskeletal: Negative for myalgias and arthralgias.  Skin: Negative for rash.  Neurological: Negative for dizziness, weakness and numbness.       Objective:   Physical Exam  Constitutional: She is oriented to person, place, and time. She appears well-developed and well-nourished. No distress.  HENT:  Head: Normocephalic and atraumatic.  Eyes: Conjunctivae and EOM are normal. Pupils are equal, round, and reactive to light. No scleral icterus.  Cardiovascular: Normal rate, regular rhythm and intact distal pulses.   Murmur (3/6 systolic murmur.) heard. Pulmonary/Chest: Effort normal and breath sounds normal. No respiratory distress. She has no wheezes. She has no rales.  Abdominal: She exhibits distension (Mild.).  Musculoskeletal: Normal range of motion. She exhibits no edema or tenderness.  Neurological: She is alert and oriented to person, place, and time. No cranial nerve deficit. She exhibits normal muscle tone.  Skin: Skin is warm and dry.  Keloids at sites of PICC and central line placements.          Assessment &  Plan:  Please see problem-based assessment and plan.

## 2014-04-21 NOTE — Assessment & Plan Note (Signed)
Follows in heart failure clinic and recently weaned from milrinone drip. Symptoms of been controlled prior to having a high salt meal yesterday. She reports this usually resolves after taking an extra 40 mg of Lasix in the evening. Currently undergoing assessment for heart transplant. -Patient will take 80 mg of Lasix this evening and weigh herself tomorrow. Told her to contact the heart failure clinic if her shortness of breath worsens or her weight continues to increase. -Follow up with heart failure clinic in March or sooner if symptoms. -Continue current meds per heart failure recommendations including Cozaar 12.5 mg daily, Lasix 80 mg in the morning and 40 mg in the evening, digoxin 0.0625 mg every other day, amiodarone 200 mg daily.

## 2014-04-21 NOTE — Assessment & Plan Note (Addendum)
2 out of 3 fecal occult blood cards positive on colon cancer screening. Has been previously unable to undergo colonoscopy due to heart failure, but she will need colonoscopy prior to heart transplant as well. Medicare does not cover virtual colonoscopy, and this would not be therapeutic anyways. -Sent message to Dr. Haroldine Laws regarding ability of patient to undergo colonoscopy. -Could consider trying to get approval for virtual colonoscopy if not possible.  --Addendum-- Arman Filter, MD, PhD Internal Medicine Intern Pager: [phone] 04/22/2014,7:02 PM  Dr. Haroldine Laws is okay with her having a colonoscopy. -Refer to GI for colonoscopy.

## 2014-04-21 NOTE — Progress Notes (Signed)
Medicine attending: Medical history, presenting problems, physical findings, and medications, reviewed with Dr Karle Starch Moding and I concur with his evaluation and management plan.

## 2014-04-21 NOTE — Assessment & Plan Note (Signed)
Lab Results  Component Value Date   HGBA1C 6.5 04/21/2014   HGBA1C 6.7* 10/07/2013   HGBA1C 6.6 09/09/2013     Assessment: Diabetes control: good control (HgbA1C at goal) Progress toward A1C goal:  at goal Comments: She's been taking 5 units of Humalog instead of the prescribed 6 with good blood sugar control and no lows.  Plan: Medications:  Ordered Humalog KwikPen 5 units before meals, continue Lantus 30 units at bedtime. Home glucose monitoring: Frequency: 3 times a day Timing: before meals Instruction/counseling given: reminded to get eye exam, reminded to bring blood glucose meter & log to each visit, reminded to bring medications to each visit and discussed foot care Other plans: Foot exam performed today, referred to ophthalmology for eye exam.

## 2014-04-21 NOTE — Patient Instructions (Signed)
Thank you for coming to clinic today [name].  General instructions: -I will contact Dr. Haroldine Laws regarding scheduling you for a colonoscopy. -Congratulations on your excellent blood sugar control. Continue your current insulin regimen. -We will refer you to an ophthalmologist for eye exam and contact you with the appointment. -Let us know if you have any trouble getting any of your diabetes medications or supplies. -Contact your cardiologist if you have any worsening shortness of breath. -Please make a follow up appointment to return to clinic in 6 months.  Please bring your medicines with you each time you come.   Medicines may be  Eye drops  Herbal   Vitamins  Pills  Seeing these help [place] take care of you.

## 2014-04-22 NOTE — Addendum Note (Signed)
Addended by: Charlesetta Shanks on: 04/22/2014 07:04 PM   Modules accepted: Orders

## 2014-04-25 ENCOUNTER — Encounter: Payer: Self-pay | Admitting: Internal Medicine

## 2014-05-03 ENCOUNTER — Other Ambulatory Visit (HOSPITAL_COMMUNITY): Payer: Self-pay | Admitting: Anesthesiology

## 2014-05-04 ENCOUNTER — Telehealth (HOSPITAL_COMMUNITY): Payer: Self-pay | Admitting: Vascular Surgery

## 2014-05-04 DIAGNOSIS — Z452 Encounter for adjustment and management of vascular access device: Secondary | ICD-10-CM | POA: Diagnosis not present

## 2014-05-04 DIAGNOSIS — E119 Type 2 diabetes mellitus without complications: Secondary | ICD-10-CM | POA: Diagnosis not present

## 2014-05-04 DIAGNOSIS — I349 Nonrheumatic mitral valve disorder, unspecified: Secondary | ICD-10-CM | POA: Diagnosis not present

## 2014-05-04 DIAGNOSIS — I5023 Acute on chronic systolic (congestive) heart failure: Secondary | ICD-10-CM | POA: Diagnosis not present

## 2014-05-04 DIAGNOSIS — I255 Ischemic cardiomyopathy: Secondary | ICD-10-CM | POA: Diagnosis not present

## 2014-05-04 DIAGNOSIS — I129 Hypertensive chronic kidney disease with stage 1 through stage 4 chronic kidney disease, or unspecified chronic kidney disease: Secondary | ICD-10-CM | POA: Diagnosis not present

## 2014-05-04 MED ORDER — POTASSIUM CHLORIDE CRYS ER 20 MEQ PO TBCR: 20.0000 meq | EXTENDED_RELEASE_TABLET | Freq: Every day | ORAL | Status: DC

## 2014-05-04 MED ORDER — METOLAZONE 2.5 MG PO TABS: 2.5000 mg | ORAL_TABLET | ORAL | Status: DC

## 2014-05-04 MED ORDER — FUROSEMIDE 80 MG PO TABS: ORAL_TABLET | ORAL | Status: DC

## 2014-05-04 NOTE — Telephone Encounter (Signed)
Spoke w/pt, she states her wt has gone up, she use to run in the 180s now she is staying in the 190s, she reports edema and only minimal SOB with activity, she states she has been taking Lasix 80/40 and has taken 80/80 for past few days with no help.  Discussed w/Dr Bensimhon, he would like pt to start Metolazone 2.5 mg for 2 days then take every Mon and Fri along with extra 20 meq KCL, pt aware and agreeable, rx sent in

## 2014-05-04 NOTE — Telephone Encounter (Signed)
[facility] called pt weight is up 190 usually running 186, she has notice increased SOB girth is up 2 cm, lungs are clear 02 stats normal.. Please call pt

## 2014-05-04 NOTE — Telephone Encounter (Signed)
Left message to call back  

## 2014-05-09 DIAGNOSIS — R195 Other fecal abnormalities: Secondary | ICD-10-CM | POA: Diagnosis not present

## 2014-05-10 ENCOUNTER — Telehealth: Payer: Self-pay | Admitting: Cardiology

## 2014-05-10 NOTE — Telephone Encounter (Signed)
Pt agreed to be enrolled in [facility] clinic. 1st transmission 06-13-14

## 2014-05-12 DIAGNOSIS — Z452 Encounter for adjustment and management of vascular access device: Secondary | ICD-10-CM | POA: Diagnosis not present

## 2014-05-12 DIAGNOSIS — E119 Type 2 diabetes mellitus without complications: Secondary | ICD-10-CM | POA: Diagnosis not present

## 2014-05-12 DIAGNOSIS — I129 Hypertensive chronic kidney disease with stage 1 through stage 4 chronic kidney disease, or unspecified chronic kidney disease: Secondary | ICD-10-CM | POA: Diagnosis not present

## 2014-05-12 DIAGNOSIS — I5023 Acute on chronic systolic (congestive) heart failure: Secondary | ICD-10-CM | POA: Diagnosis not present

## 2014-05-12 DIAGNOSIS — I255 Ischemic cardiomyopathy: Secondary | ICD-10-CM | POA: Diagnosis not present

## 2014-05-12 DIAGNOSIS — I349 Nonrheumatic mitral valve disorder, unspecified: Secondary | ICD-10-CM | POA: Diagnosis not present

## 2014-05-18 DIAGNOSIS — E119 Type 2 diabetes mellitus without complications: Secondary | ICD-10-CM | POA: Diagnosis not present

## 2014-05-18 DIAGNOSIS — I255 Ischemic cardiomyopathy: Secondary | ICD-10-CM | POA: Diagnosis not present

## 2014-05-18 DIAGNOSIS — I349 Nonrheumatic mitral valve disorder, unspecified: Secondary | ICD-10-CM | POA: Diagnosis not present

## 2014-05-18 DIAGNOSIS — I5023 Acute on chronic systolic (congestive) heart failure: Secondary | ICD-10-CM | POA: Diagnosis not present

## 2014-05-18 DIAGNOSIS — I129 Hypertensive chronic kidney disease with stage 1 through stage 4 chronic kidney disease, or unspecified chronic kidney disease: Secondary | ICD-10-CM | POA: Diagnosis not present

## 2014-05-18 DIAGNOSIS — Z452 Encounter for adjustment and management of vascular access device: Secondary | ICD-10-CM | POA: Diagnosis not present

## 2014-05-19 ENCOUNTER — Ambulatory Visit (HOSPITAL_COMMUNITY)
Admission: RE | Admit: 2014-05-19 | Discharge: 2014-05-19 | Disposition: A | Discharge status: Home / Self Care | Payer: Medicare Other | Source: Ambulatory Visit | Attending: Adult Health | Admitting: Adult Health

## 2014-05-19 ENCOUNTER — Telehealth (HOSPITAL_COMMUNITY): Payer: Self-pay

## 2014-05-19 ENCOUNTER — Encounter: Payer: Self-pay | Admitting: Internal Medicine

## 2014-05-19 ENCOUNTER — Encounter (HOSPITAL_COMMUNITY): Payer: Self-pay

## 2014-05-19 VITALS — BP 95/64 | HR 54 | Resp 20 | Wt 192.8 lb

## 2014-05-19 DIAGNOSIS — Z7982 Long term (current) use of aspirin: Secondary | ICD-10-CM | POA: Diagnosis not present

## 2014-05-19 DIAGNOSIS — Z79899 Other long term (current) drug therapy: Secondary | ICD-10-CM | POA: Insufficient documentation

## 2014-05-19 DIAGNOSIS — Z95810 Presence of automatic (implantable) cardiac defibrillator: Secondary | ICD-10-CM | POA: Insufficient documentation

## 2014-05-19 DIAGNOSIS — E119 Type 2 diabetes mellitus without complications: Secondary | ICD-10-CM | POA: Diagnosis not present

## 2014-05-19 DIAGNOSIS — I129 Hypertensive chronic kidney disease with stage 1 through stage 4 chronic kidney disease, or unspecified chronic kidney disease: Secondary | ICD-10-CM | POA: Diagnosis not present

## 2014-05-19 DIAGNOSIS — I472 Ventricular tachycardia: Secondary | ICD-10-CM | POA: Diagnosis not present

## 2014-05-19 DIAGNOSIS — Z794 Long term (current) use of insulin: Secondary | ICD-10-CM | POA: Insufficient documentation

## 2014-05-19 DIAGNOSIS — I2510 Atherosclerotic heart disease of native coronary artery without angina pectoris: Secondary | ICD-10-CM | POA: Insufficient documentation

## 2014-05-19 DIAGNOSIS — I5022 Chronic systolic (congestive) heart failure: Secondary | ICD-10-CM | POA: Insufficient documentation

## 2014-05-19 DIAGNOSIS — I255 Ischemic cardiomyopathy: Secondary | ICD-10-CM | POA: Diagnosis not present

## 2014-05-19 DIAGNOSIS — N183 Chronic kidney disease, stage 3 unspecified: Secondary | ICD-10-CM

## 2014-05-19 DIAGNOSIS — I4729 Other ventricular tachycardia: Secondary | ICD-10-CM

## 2014-05-19 DIAGNOSIS — Z951 Presence of aortocoronary bypass graft: Secondary | ICD-10-CM | POA: Insufficient documentation

## 2014-05-19 DIAGNOSIS — I340 Nonrheumatic mitral (valve) insufficiency: Secondary | ICD-10-CM | POA: Diagnosis not present

## 2014-05-19 LAB — BASIC METABOLIC PANEL
Anion gap: 8 (ref 5–15)
BUN: 34 mg/dL — AB (ref 6–23)
CHLORIDE: 100 mmol/L (ref 96–112)
CO2: 30 mmol/L (ref 19–32)
Calcium: 9.5 mg/dL (ref 8.4–10.5)
Creatinine, Ser: 1.47 mg/dL — ABNORMAL HIGH (ref 0.50–1.10)
GFR calc Af Amer: 45 mL/min — ABNORMAL LOW (ref >90)
GFR calc non Af Amer: 39 mL/min — ABNORMAL LOW (ref >90)
GLUCOSE: 148 mg/dL — AB (ref 70–99)
Potassium: 3.1 mmol/L — ABNORMAL LOW (ref 3.5–5.1)
Sodium: 138 mmol/L (ref 135–145)

## 2014-05-19 MED ORDER — POTASSIUM CHLORIDE CRYS ER 20 MEQ PO TBCR: 20.0000 meq | EXTENDED_RELEASE_TABLET | Freq: Every day | ORAL | Status: DC

## 2014-05-19 MED ORDER — METOLAZONE 2.5 MG PO TABS: 2.5000 mg | ORAL_TABLET | ORAL | Status: DC | PRN

## 2014-05-19 NOTE — Telephone Encounter (Signed)
Today's BMET resulted with low serum K of 3.1, per Amy Clegg NP-C needs to take extra 81meq today and return next week for repeat lab draw.  LVMTCB.  Renee Pain

## 2014-05-19 NOTE — Progress Notes (Signed)
Patient ID: [name], female   DOB: Feb 09, 1956, 59 y.o.   MRN: [id number] PCP: Dr. Nicoletta Dress (Internal Medicine)  HPI: 59 year old female with history of HTN, DM2, past polysubstance abuse (ETOH, tobacco, cocaine), CAD s/p CABG x5 with MV annuloplasty (2007), ICM s/p ICD  (CRT upgrade 39/76), chronic systolic HF EF 73%, severe MR, CKD stage III and PAD. Blood type O+  Presented in 2007 with acute anterior MI with totalled LAD in setting of cocaine use. At time of cath LAD, LCX and RCA occluded. EF 45%. Had abrupt stent occlusion the next day and had to go back to the lab. 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   Admitted 8/2-10/07/13 for syncope s/p ICD shock. Concern that VT was possibly from ischemia and taken for [facility]. During cath patient had vagal response and BP dropped to 50s and co-ox was in the upper 40s. Placed on Levophed and transferred to floor. Started on Amiodarone.   On 11/25/13 underwent RHC for low output symptoms. Hemodynamic borderline. Swan left in and she was observed. Cardiac output dropped while in hospital so started on milrinone. Discharged home. Underwent CRT-D upgrade on 12/02/13  In 12/15, she was admitted with catheter infection. She was admitted and catheter was replaced.  She has completed antibiotic.    She returns for follow up. PICC removed 04/12/2014 due to pain. Overall feeling good. Denies SOB/PND/Orthopnea. Taking all medications. Weight at home 190 pounds. Able to walk 1/2 mile.   Optivol: Today well below threshold and flat. Activity 3-4 hours. No VT   Echos: 05/19/2012  EF 15% 2/15 EF 15%, diffuse hypokinesis, restrictive diastolic function, s/p MV repair with moderate-severe MR.  4/15: EF 15%, severe MR, mod TR  CPX: 06/17/12 Peak VO2 14.8 (predicted peak VO2 68.5%), VE/VCO2 slope 43.4 OUES: 1.19, Peak RER 1.12 Vent threshold 11.3 (pred peak VO2 52.3%)  CPX (3/15): peak VO2 15.6 (predicted peak VO2 74.5%)  VE/VO2 40.8, RER 1.2  LHC (10/2013) 1) severe native CAD with all bypass grafts patent  11/01/13 RHC RA = 5  RV = 47/3/10  PA = 54/16 (30)  PCW = 14 v = 20  Fick cardiac output/index = 4.8/2.6  Thermo CO/CI = 3.7/2.0  PVR = 4.3 WU  FA sat = 95%  PA sat = 59%, 59%   Labs:  08/12/12 - on lipitor 40 mg daily Cholesterol 180 Triglyceride 195 HDL 29 LDL 112 K 4.5, creatinine 1.4 11/16/12 K 4.0 Labs 1.5 Pro BNP 181 02/14/13: K+ 4.6, Cr 1.33, Dig level 1.8, TSH 2.97 3/15: K 4.2, creatinine 1.07, HCT 32.4 06/2013: Dig level 0.3, pro-BNP 899, Cholesterol 140, TG 106, HDL 36, LDL 82, Cr 1.5, K+ 4.9 11/11/13: K 4.1 Creatinine 1.8  01/31/14 Creatinine 1.48 K .5  02/07/14:  K 4.2 Creatinine 1.75 Magnesium 2.2 after sprionolactone 12.5 mg  was added  12/15: K 3.8, creatinine 1.32  04/04/2014: K 4.8 Creatinine 1.59  FH: Mother deceased: CAD, DM2, HTN        Father deceased: stroke  SH: Works odds/end jobs for [employer]; disabled. Lives in [place] with 2 sons([name] and [name]).   ROS: All systems negative except as listed in HPI, PMH and Problem List.  Past Medical History  Diagnosis Date  . Coronary artery disease     a. s/p CABG x 5 with MV annuloplasty 2007   . Diabetes mellitus   . Hypertension   . Chronic systolic  heart failure     a. ICM b. ECHO (06/2013): EF 15%, severe MR (06/2013) c. RHC (10/2013): RA 5, RV 23/2/5, PA 25/6 (14), PCWP 12, Fick CO/CI: 3.2/1.7, PVR 0.7 WU, PA 45%, 47% and 55% (with levophed 5), vagal response during cath. d. On home milrinone.  . Polysubstance abuse     history of  (cocaine, tobacco and ETOH)  . V-tach   . Ischemic cardiomyopathy     a. s/p ICD. b. LHC (10/04/13): 1. Severe native CAD with all bypass grafts patent. c. CRT upgrade 12/2013.  . Implantable defibrillator   medtronic   . LBBB (left bundle branch block)   . AICD (automatic cardioverter/defibrillator) present   . PICC line infection     a. 01/2014 - PICC exchanged.  . CKD (chronic kidney  disease), stage III   . PAD (peripheral artery disease)   . Lipoma     Current Outpatient Prescriptions  Medication Sig Dispense Refill  . acetaminophen (TYLENOL) 325 MG tablet Take 650 mg by mouth every 6 (six) hours as needed for mild pain.    [name] allopurinol (ZYLOPRIM) 100 MG tablet Take 1-2 tablets (100-200 mg total) by mouth 2 (two) times daily. Takes 200 mg in the morning and 100 mg in the evening 90 tablet 3  . amiodarone (PACERONE) 200 MG tablet Take 1 tablet (200 mg total) by mouth daily. 30 tablet 3  . aspirin EC 81 MG tablet Take 81 mg by mouth daily.    [name] atorvastatin (LIPITOR) 80 MG tablet Take 1 tablet (80 mg total) by mouth daily. 30 tablet 11  . digoxin (LANOXIN) 0.125 MG tablet Take 0.5 tablets (0.0625 mg total) by mouth every other day. 15 tablet 3  . furosemide (LASIX) 80 MG tablet Take 80 mg in AM and 40 mg in PM 90 tablet 3  . glucose blood (ACCU-CHEK AVIVA PLUS) test strip The patient is insulin requiring, ICD 10 code E11.9. The patient tests 3 times per day. 100 each 12  . Insulin Glargine (LANTUS SOLOSTAR) 100 UNIT/ML Solostar Pen Inject 30 Units into the skin at bedtime. 15 mL 5  . insulin lispro (HUMALOG KWIKPEN) 100 UNIT/ML KiwkPen Inject 0.05 mLs (5 Units total) into the skin 3 (three) times daily before meals. Insulin requiring, ICD 10 code E11.9. Tests TID. 15 mL 11  . Insulin Pen Needle 31G X 8 MM MISC 1 each by Does not apply route 3 (three) times daily. Inject insulin 3 times daily.  Insulin dependent DM E11.9. 100 each 11  . losartan (COZAAR) 25 MG tablet TAKE ONE-HALF TABLET BY MOUTH DAILY 15 tablet 0  . metolazone (ZAROXOLYN) 2.5 MG tablet Take 1 tablet (2.5 mg total) by mouth 2 (two) times a week. Every Mon and Fri 15 tablet 3  . nitroGLYCERIN (NITROSTAT) 0.4 MG SL tablet Place 1 tablet (0.4 mg total) under the tongue every 5 (five) minutes as needed. For chest pain. 25 tablet 11  . potassium chloride SA (K-DUR,KLOR-CON) 20 MEQ tablet Take 1 tablet (20 mEq  total) by mouth daily. Take extra tab on Mon and Fri with metolazone    . [DISCONTINUED] sitaGLIPtin (JANUVIA) 25 MG tablet Take 1 tablet (25 mg total) by mouth daily. 30 tablet 1   No current facility-administered medications for this encounter.    Filed Vitals:   05/19/14 0859  BP: 95/64  Pulse: 54  Resp: 20  Weight: 192 lb 12 oz (87.431 kg)  SpO2: 95%   PHYSICAL EXAM: General:  Well appearing. No resp difficulty .  HEENT: normal Neck: supple. JVP flat  Carotids 2+ bilaterally; no bruits. No lymphadenopathy or thryomegaly appreciated. Cor: PMI normal. Regular rate & rhythm. 2/6 SEM LSB 2/6 MR  no s3  R upper chest tunneled catheter that is tender to touch and has slight yellowish drainage Lungs: clear Abdomen: obese,soft, nontender, mildly distended. No hepatosplenomegaly. No bruits or masses. Good bowel sounds. Extremities: no cyanosis, clubbing, rash, edema. Neuro: alert & orientedx3, cranial nerves grossly intact. Moves all 4 extremities w/o difficulty. Affect pleasant.   ASSESSMENT & PLAN:  1. Chronic Systolic Heart Failure: Ischemic cardiomyopathy s/p CRT-D, EF 15% (4/15).  NYHA class II symptoms.  She was seen in transplant clinic at [facility] last week and the main concern was  keloids if LVAD pursued. Evaluated by Dr Migdalia Dk, plastic surgeon due to keloid however she was not concerned and said this could be managed as an oupatiet.   She is blood type O so will be difficult.  May need LVAD prior to getting transplant. Has colonoscopy next month.  Milrinone stopped in February due scar and pain from PICC and she seems to be doing great.  - Volume status looks great and verified with optivol. In fact seems low. Continue lasix 80 mg in am and 40 mg in pm. Can change metolazone to as needed. She wont need extra potassium on Monday and Friday unless she takes metolazone.   Continue losartan and digoxin at current doses.  -Not on Spironolactone due to rise in creatinine.  - Check BMET.    - Reinforced the need and importance of daily weights, a low sodium diet, and fluid restriction (less than 2 L a day). Instructed to call the HF clinic if weight increases more than 3 lbs overnight or 5 lbs in a week.  2. CAD: No chest pain.  Continue ASA and statin.  3. Mitral regurgitation: s/p mitral valve annuloplasty with CABG. Moderate to severe MR on echo in 4/15. TEE (06/2013) mitral regurg appears severe, anatomy of repaired valve does not look suitable for MV clipping and would be high risk for MV replacement.  4. CKD stage III: Last creatinine 1.5. Check BMET today.     5.  NSVT: denies any palpitations. Continue amiodarone 200 mg daily. She understands she needs yearly eye exams. TSH ok 01/2014        Follow up in 6 wks.    CLEGG,AMY NP-C  05/19/2014

## 2014-05-19 NOTE — Patient Instructions (Addendum)
TAKE  Metolazone as needed.  TAKE extra  Potassium 77meq (1 tablet) when you take Metolazone.  Labs today.  FOLLOW UP in 6 weeks.

## 2014-05-20 ENCOUNTER — Other Ambulatory Visit (HOSPITAL_COMMUNITY): Payer: Self-pay | Admitting: Anesthesiology

## 2014-05-22 DIAGNOSIS — I5023 Acute on chronic systolic (congestive) heart failure: Secondary | ICD-10-CM | POA: Diagnosis not present

## 2014-05-22 DIAGNOSIS — I129 Hypertensive chronic kidney disease with stage 1 through stage 4 chronic kidney disease, or unspecified chronic kidney disease: Secondary | ICD-10-CM | POA: Diagnosis not present

## 2014-05-22 DIAGNOSIS — E119 Type 2 diabetes mellitus without complications: Secondary | ICD-10-CM | POA: Diagnosis not present

## 2014-05-22 DIAGNOSIS — Z452 Encounter for adjustment and management of vascular access device: Secondary | ICD-10-CM | POA: Diagnosis not present

## 2014-05-22 DIAGNOSIS — I255 Ischemic cardiomyopathy: Secondary | ICD-10-CM | POA: Diagnosis not present

## 2014-05-22 DIAGNOSIS — I349 Nonrheumatic mitral valve disorder, unspecified: Secondary | ICD-10-CM | POA: Diagnosis not present

## 2014-05-24 ENCOUNTER — Ambulatory Visit (HOSPITAL_COMMUNITY)
Admission: RE | Admit: 2014-05-24 | Discharge: 2014-05-24 | Disposition: A | Discharge status: Home / Self Care | Payer: Medicare Other | Source: Ambulatory Visit | Attending: Adult Health | Admitting: Adult Health

## 2014-05-24 DIAGNOSIS — I5022 Chronic systolic (congestive) heart failure: Secondary | ICD-10-CM | POA: Diagnosis not present

## 2014-05-24 LAB — BASIC METABOLIC PANEL
Anion gap: 12 (ref 5–15)
BUN: 33 mg/dL — ABNORMAL HIGH (ref 6–23)
CALCIUM: 9.8 mg/dL (ref 8.4–10.5)
CO2: 27 mmol/L (ref 19–32)
Chloride: 99 mmol/L (ref 96–112)
Creatinine, Ser: 1.49 mg/dL — ABNORMAL HIGH (ref 0.50–1.10)
GFR calc Af Amer: 44 mL/min — ABNORMAL LOW (ref >90)
GFR calc non Af Amer: 38 mL/min — ABNORMAL LOW (ref >90)
GLUCOSE: 125 mg/dL — AB (ref 70–99)
Potassium: 3.6 mmol/L (ref 3.5–5.1)
Sodium: 138 mmol/L (ref 135–145)

## 2014-05-25 ENCOUNTER — Telehealth (HOSPITAL_COMMUNITY): Payer: Self-pay | Admitting: Vascular Surgery

## 2014-05-25 ENCOUNTER — Encounter (HOSPITAL_COMMUNITY): Payer: Self-pay | Admitting: *Deleted

## 2014-05-25 MED ORDER — POTASSIUM CHLORIDE CRYS ER 20 MEQ PO TBCR: 20.0000 meq | EXTENDED_RELEASE_TABLET | Freq: Every morning | ORAL | Status: DC

## 2014-05-25 NOTE — Telephone Encounter (Signed)
Pt called she needs a new prescription for potassium with new dosage , she states she ran out due to dosage change.. Please advise

## 2014-05-30 ENCOUNTER — Telehealth (HOSPITAL_COMMUNITY): Payer: Self-pay | Admitting: Vascular Surgery

## 2014-05-30 DIAGNOSIS — I5022 Chronic systolic (congestive) heart failure: Secondary | ICD-10-CM

## 2014-05-30 MED ORDER — ALLOPURINOL 100 MG PO TABS: 100.0000 mg | ORAL_TABLET | Freq: Two times a day (BID) | ORAL | Status: DC

## 2014-05-30 NOTE — Telephone Encounter (Signed)
Pt has no more refills Allopurinol

## 2014-06-05 ENCOUNTER — Telehealth (HOSPITAL_COMMUNITY): Payer: Self-pay | Admitting: Vascular Surgery

## 2014-06-05 DIAGNOSIS — I5022 Chronic systolic (congestive) heart failure: Secondary | ICD-10-CM

## 2014-06-05 MED ORDER — LOSARTAN POTASSIUM 25 MG PO TABS: ORAL_TABLET | ORAL | Status: DC

## 2014-06-05 NOTE — Telephone Encounter (Signed)
Pt has no more refills Losartan

## 2014-06-07 ENCOUNTER — Other Ambulatory Visit: Payer: Self-pay | Admitting: Gastroenterology

## 2014-06-07 NOTE — Anesthesia Preprocedure Evaluation (Addendum)
Anesthesia Evaluation  Patient identified by MRN, date of birth, ID band Patient awake    Reviewed: Allergy & Precautions, H&P , NPO status , Patient's Chart, lab work & pertinent test results  Airway Mallampati: II  TM Distance: >3 FB Neck ROM: full    Dental no notable dental hx. (+) Teeth Intact, Dental Advisory Given   Pulmonary neg pulmonary ROS, former smoker,  breath sounds clear to auscultation  Pulmonary exam normal       Cardiovascular Exercise Tolerance: Poor hypertension, Pt. on medications + CAD, + CABG, + Peripheral Vascular Disease and +CHF + dysrhythmias Atrial Fibrillation and Ventricular Tachycardia + Cardiac Defibrillator Rhythm:regular Rate:Normal  Chronic systolic heart failure. EF 15%. Severe MR 4/15. LBBB   Neuro/Psych negative neurological ROS  negative psych ROS   GI/Hepatic negative GI ROS, (+)     substance abuse  alcohol use, cocaine use and IV drug use,   Endo/Other  diabetes, Well Controlled, Type 2, Insulin Dependent  Renal/GU Renal diseaseStage 3 chronic kidney disease  negative genitourinary   Musculoskeletal   Abdominal   Peds  Hematology negative hematology ROS (+)   Anesthesia Other Findings   Reproductive/Obstetrics negative OB ROS                            Anesthesia Physical Anesthesia Plan  ASA: IV  Anesthesia Plan: MAC   Post-op Pain Management:    Induction:   Airway Management Planned:   Additional Equipment:   Intra-op Plan:   Post-operative Plan:   Informed Consent: I have reviewed the patients History and Physical, chart, labs and discussed the procedure including the risks, benefits and alternatives for the proposed anesthesia with the patient or authorized representative who has indicated his/her understanding and acceptance.   Dental Advisory Given  Plan Discussed with: CRNA and Surgeon  Anesthesia Plan Comments:          Anesthesia Quick Evaluation

## 2014-06-08 ENCOUNTER — Encounter (HOSPITAL_COMMUNITY)
Admission: RE | Disposition: A | Discharge status: Home / Self Care | Payer: Self-pay | Source: Ambulatory Visit | Attending: Gastroenterology

## 2014-06-08 ENCOUNTER — Ambulatory Visit (HOSPITAL_COMMUNITY): Payer: Medicare Other | Admitting: Anesthesiology

## 2014-06-08 ENCOUNTER — Encounter (HOSPITAL_COMMUNITY): Payer: Self-pay | Admitting: Gastroenterology

## 2014-06-08 ENCOUNTER — Ambulatory Visit (HOSPITAL_COMMUNITY)
Admission: RE | Admit: 2014-06-08 | Discharge: 2014-06-08 | Disposition: A | Discharge status: Home / Self Care | Payer: Medicare Other | Source: Ambulatory Visit | Attending: Gastroenterology | Admitting: Gastroenterology

## 2014-06-08 DIAGNOSIS — R195 Other fecal abnormalities: Secondary | ICD-10-CM | POA: Diagnosis not present

## 2014-06-08 DIAGNOSIS — D124 Benign neoplasm of descending colon: Secondary | ICD-10-CM | POA: Insufficient documentation

## 2014-06-08 DIAGNOSIS — K648 Other hemorrhoids: Secondary | ICD-10-CM | POA: Insufficient documentation

## 2014-06-08 DIAGNOSIS — I509 Heart failure, unspecified: Secondary | ICD-10-CM | POA: Insufficient documentation

## 2014-06-08 DIAGNOSIS — D122 Benign neoplasm of ascending colon: Secondary | ICD-10-CM | POA: Diagnosis not present

## 2014-06-08 DIAGNOSIS — Z95810 Presence of automatic (implantable) cardiac defibrillator: Secondary | ICD-10-CM | POA: Insufficient documentation

## 2014-06-08 DIAGNOSIS — Z951 Presence of aortocoronary bypass graft: Secondary | ICD-10-CM | POA: Insufficient documentation

## 2014-06-08 DIAGNOSIS — N183 Chronic kidney disease, stage 3 (moderate): Secondary | ICD-10-CM | POA: Diagnosis not present

## 2014-06-08 DIAGNOSIS — I4891 Unspecified atrial fibrillation: Secondary | ICD-10-CM | POA: Insufficient documentation

## 2014-06-08 DIAGNOSIS — I10 Essential (primary) hypertension: Secondary | ICD-10-CM | POA: Diagnosis not present

## 2014-06-08 DIAGNOSIS — F1910 Other psychoactive substance abuse, uncomplicated: Secondary | ICD-10-CM | POA: Diagnosis not present

## 2014-06-08 DIAGNOSIS — Z87891 Personal history of nicotine dependence: Secondary | ICD-10-CM | POA: Diagnosis not present

## 2014-06-08 DIAGNOSIS — Z79899 Other long term (current) drug therapy: Secondary | ICD-10-CM | POA: Insufficient documentation

## 2014-06-08 DIAGNOSIS — D125 Benign neoplasm of sigmoid colon: Secondary | ICD-10-CM | POA: Insufficient documentation

## 2014-06-08 DIAGNOSIS — I2510 Atherosclerotic heart disease of native coronary artery without angina pectoris: Secondary | ICD-10-CM | POA: Diagnosis not present

## 2014-06-08 DIAGNOSIS — D123 Benign neoplasm of transverse colon: Secondary | ICD-10-CM | POA: Diagnosis not present

## 2014-06-08 DIAGNOSIS — I129 Hypertensive chronic kidney disease with stage 1 through stage 4 chronic kidney disease, or unspecified chronic kidney disease: Secondary | ICD-10-CM | POA: Insufficient documentation

## 2014-06-08 DIAGNOSIS — E119 Type 2 diabetes mellitus without complications: Secondary | ICD-10-CM | POA: Diagnosis not present

## 2014-06-08 DIAGNOSIS — D120 Benign neoplasm of cecum: Secondary | ICD-10-CM | POA: Diagnosis not present

## 2014-06-08 DIAGNOSIS — K635 Polyp of colon: Secondary | ICD-10-CM | POA: Diagnosis not present

## 2014-06-08 DIAGNOSIS — I739 Peripheral vascular disease, unspecified: Secondary | ICD-10-CM | POA: Insufficient documentation

## 2014-06-08 HISTORY — PX: COLONOSCOPY WITH PROPOFOL: SHX5780

## 2014-06-08 LAB — BASIC METABOLIC PANEL
ANION GAP: 13 (ref 5–15)
BUN: 42 mg/dL — ABNORMAL HIGH (ref 6–23)
CHLORIDE: 96 mmol/L (ref 96–112)
CO2: 29 mmol/L (ref 19–32)
CREATININE: 1.47 mg/dL — AB (ref 0.50–1.10)
Calcium: 9 mg/dL (ref 8.4–10.5)
GFR calc non Af Amer: 39 mL/min — ABNORMAL LOW (ref >90)
GFR, EST AFRICAN AMERICAN: 45 mL/min — AB (ref >90)
Glucose, Bld: 112 mg/dL — ABNORMAL HIGH (ref 70–99)
Potassium: 3.3 mmol/L — ABNORMAL LOW (ref 3.5–5.1)
SODIUM: 138 mmol/L (ref 135–145)

## 2014-06-08 SURGERY — COLONOSCOPY WITH PROPOFOL
Anesthesia: Monitor Anesthesia Care

## 2014-06-08 MED ORDER — SODIUM CHLORIDE 0.9 % IV SOLN
INTRAVENOUS | Status: DC | PRN
  Administered 2014-06-08: 09:00:00 via INTRAVENOUS

## 2014-06-08 MED ORDER — SODIUM CHLORIDE 0.9 % IV SOLN: INTRAVENOUS | Status: DC

## 2014-06-08 MED ORDER — PROPOFOL 10 MG/ML IV BOLUS
INTRAVENOUS | Status: AC
  Filled 2014-06-08: qty 20

## 2014-06-08 MED ORDER — LACTATED RINGERS IV SOLN: INTRAVENOUS | Status: DC

## 2014-06-08 MED ORDER — PROPOFOL 10 MG/ML IV BOLUS
INTRAVENOUS | Status: DC | PRN
  Administered 2014-06-08: 50 mg via INTRAVENOUS
  Administered 2014-06-08 (×3): 25 mg via INTRAVENOUS
  Administered 2014-06-08: 50 mg via INTRAVENOUS
  Administered 2014-06-08: 25 mg via INTRAVENOUS
  Administered 2014-06-08: 50 mg via INTRAVENOUS
  Administered 2014-06-08: 25 mg via INTRAVENOUS
  Administered 2014-06-08 (×3): 50 mg via INTRAVENOUS
  Administered 2014-06-08: 25 mg via INTRAVENOUS
  Administered 2014-06-08 (×3): 50 mg via INTRAVENOUS
  Administered 2014-06-08 (×2): 25 mg via INTRAVENOUS

## 2014-06-08 MED ORDER — FENTANYL CITRATE 0.05 MG/ML IJ SOLN: 25.0000 ug | INTRAMUSCULAR | Status: DC | PRN

## 2014-06-08 SURGICAL SUPPLY — 21 items

## 2014-06-08 NOTE — Op Note (Signed)
[facility] [place], [address]   COLONOSCOPY PROCEDURE REPORT     EXAM DATE: June 26, 2014  PATIENT NAME:      [name], [name]           MR #:      [id number]  BIRTHDATE:       [birth year]      VISIT #:     [id number]  ATTENDING:     Wilford Corner, MD     STATUS:     outpatient REFERRING MD: ASA CLASS:        Class IV  INDICATIONS:  The patient is a 59 yr old female here for a colonoscopy due to heme positive stool. PROCEDURE PERFORMED:     Colonoscopy with snare polypectomy MEDICATIONS:     Monitored anesthesia care and Per Anesthesia  ESTIMATED BLOOD LOSS:     None  CONSENT: The patient understands the risks and benefits of the procedure and understands that these risks include, but are not limited to: sedation, allergic reaction, infection, perforation and/or bleeding. Alternative means of evaluation and treatment include, among others: physical exam, x-rays, and/or surgical intervention. The patient elects to proceed with this endoscopic procedure.  DESCRIPTION OF PROCEDURE: During intra-op preparation period all mechanical & medical equipment was checked for proper function. Hand hygiene and appropriate measures for infection prevention was taken. After the risks, benefits and alternatives of the procedure were thoroughly explained, Informed consent was verified, confirmed and timeout was successfully executed by the treatment team. A digital exam revealed no abnormalities of the rectum.      The Pentax Ped Colon [id number] endoscope was introduced through the anus and advanced to the cecum, which was identified by both the appendix and ileocecal valve. The prep was good.. The instrument was then slowly withdrawn as the colon was fully examined.Estimated blood loss is zero unless otherwise noted in this procedure report Multiple polyps removed as stated below. A 6 mm sessile polyp removed from the cecum, two 8 mm sessile polyps  removed  from the ascending colon, a 1 centimeter sessile polyp removed from the ascending colon, a 5 mm sessile polyp removed from the ascending colon all with snare cautery. Four subcentimeter sessile polyps removed from the hepatic flexure (2), transverse colon, and descending colon with snare cautery. A 6 mm sessile polyp removed from the splenic flexure and was unable to be retrieved. A 1 cm semi-pedunculated polyp removed from the sigmoid colon with snare cautery. A 6 mm sessile polyp removed from the sigmoid colon with snare cautery. A large semi-pedunculated sigmoid polyp (1.5 cm) removed with snare cautery.   Retroflexed views revealed small internal hemorrhoids.  The scope was then completely withdrawn from the patient and the procedure terminated.     ADVERSE EVENTS:      There were no immediate complications.   IMPRESSIONS:     Multiple colon polyps removed as stated above (12 total) Internal hemorrhoids  RECOMMENDATIONS:     No Aspirin products for 5 days; F/U on path   Wilford Corner, MD eSigned:  Wilford Corner, MD 2014/06/26 12:14 PM   cc:  CPT CODES: ICD CODES:  The ICD and CPT codes recommended by this software are interpretations from the data that the clinical staff has captured with the software.  The verification of the translation of this report to the ICD and CPT codes and modifiers is the sole responsibility of the health care institution and practicing physician where this report was  generated.  [employer] will not be held responsible for the validity of the ICD and CPT codes included on this report.  AMA assumes no liability for data contained or not contained herein. CPT is a Designer, television/film set of the [employer].  PATIENT NAME:  [name], [name] MR#: [id number]

## 2014-06-08 NOTE — Transfer of Care (Signed)
Immediate Anesthesia Transfer of Care Note  Patient: [name]  Procedure(s) Performed: Procedure(s): COLONOSCOPY WITH PROPOFOL (N/A)  Patient Location: PACU and Endoscopy Unit  Anesthesia Type:MAC  Level of Consciousness: awake, sedated and patient cooperative  Airway & Oxygen Therapy: Patient Spontanous Breathing and Patient connected to face mask oxygen  Post-op Assessment: Report given to RN and Post -op Vital signs reviewed and stable  Post vital signs: Reviewed and stable  Last Vitals:  Filed Vitals:   06/08/14 0821  BP: 94/53  Pulse: 57  Temp: 36.8 C  Resp: 12    Complications: No apparent anesthesia complications

## 2014-06-08 NOTE — Discharge Instructions (Addendum)
Hold Aspirin for the next 5 days. Will call you when the pathology results are complete. Colonoscopy, Care After These instructions give you information on caring for yourself after your procedure. Your doctor may also give you more specific instructions. Call your doctor if you have any problems or questions after your procedure. HOME CARE  Do not drive for 24 hours.  Do not sign important papers or use machinery for 24 hours.  You may shower.  You may go back to your usual activities, but go slower for the first 24 hours.  Take rest breaks often during the first 24 hours.  Walk around or use warm packs on your belly (abdomen) if you have belly cramping or gas.  Drink enough fluids to keep your pee (urine) clear or pale yellow.  Resume your normal diet. Avoid heavy or fried foods.  Avoid drinking alcohol for 24 hours or as told by your doctor.  Only take medicines as told by your doctor. If a tissue sample (biopsy) was taken during the procedure:   Do not take aspirin or blood thinners for 7 days, or as told by your doctor.  Do not drink alcohol for 7 days, or as told by your doctor.  Eat soft foods for the first 24 hours. GET HELP IF: You still have a small amount of blood in your poop (stool) 2-3 days after the procedure. GET HELP RIGHT AWAY IF:  You have more than a small amount of blood in your poop.  You see clumps of tissue (blood clots) in your poop.  Your belly is puffy (swollen).  You feel sick to your stomach (nauseous) or throw up (vomit).  You have a fever.  You have belly pain that gets worse and medicine does not help. MAKE SURE YOU:  Understand these instructions.  Will watch your condition.  Will get help right away if you are not doing well or get worse. Document Released: 03/22/2010 Document Revised: 02/22/2013 Document Reviewed: 10/25/2012 [facility] Patient Information 2015 [place], [place]. This information is not intended to replace advice  given to you by your health care provider. Make sure you discuss any questions you have with your health care provider.  Conscious Sedation, Adult, Care After Refer to this sheet in the next few weeks. These instructions provide you with information on caring for yourself after your procedure. Your health care provider may also give you more specific instructions. Your treatment has been planned according to current medical practices, but problems sometimes occur. Call your health care provider if you have any problems or questions after your procedure. WHAT TO EXPECT AFTER THE PROCEDURE  After your procedure:  You may feel sleepy, clumsy, and have poor balance for several hours.  Vomiting may occur if you eat too soon after the procedure. HOME CARE INSTRUCTIONS  Do not participate in any activities where you could become injured for at least 24 hours. Do not:  Drive.  Swim.  Ride a bicycle.  Operate heavy machinery.  Cook.  Use power tools.  Climb ladders.  Work from a high place.  Do not make important decisions or sign legal documents until you are improved.  If you vomit, drink water, juice, or soup when you can drink without vomiting. Make sure you have little or no nausea before eating solid foods.  Only take over-the-counter or prescription medicines for pain, discomfort, or fever as directed by your health care provider.  Make sure you and your family fully understand everything about the  medicines given to you, including what side effects may occur.  You should not drink alcohol, take sleeping pills, or take medicines that cause drowsiness for at least 24 hours.  If you smoke, do not smoke without supervision.  If you are feeling better, you may resume normal activities 24 hours after you were sedated.  Keep all appointments with your health care provider. SEEK MEDICAL CARE IF:  Your skin is pale or bluish in color.  You continue to feel nauseous or  vomit.  Your pain is getting worse and is not helped by medicine.  You have bleeding or swelling.  You are still sleepy or feeling clumsy after 24 hours. SEEK IMMEDIATE MEDICAL CARE IF:  You develop a rash.  You have difficulty breathing.  You develop any type of allergic problem.  You have a fever. MAKE SURE YOU:  Understand these instructions.  Will watch your condition.  Will get help right away if you are not doing well or get worse. Document Released: 12/08/2012 Document Reviewed: 12/08/2012 [facility] Patient Information 2015 [place], [place]. This information is not intended to replace advice given to you by your health care provider. Make sure you discuss any questions you have with your health care provider.

## 2014-06-08 NOTE — Addendum Note (Signed)
Addended by: Wilford Corner on: 06/08/2014 08:35 AM   Modules accepted: Orders

## 2014-06-08 NOTE — Anesthesia Postprocedure Evaluation (Signed)
  Anesthesia Post-op Note  Patient: [name]  Procedure(s) Performed: Procedure(s) (LRB): COLONOSCOPY WITH PROPOFOL (N/A)  Patient Location: PACU  Anesthesia Type: MAC  Level of Consciousness: awake and alert   Airway and Oxygen Therapy: Patient Spontanous Breathing  Post-op Pain: mild  Post-op Assessment: Post-op Vital signs reviewed, Patient's Cardiovascular Status Stable, Respiratory Function Stable, Patent Airway and No signs of Nausea or vomiting  Last Vitals:  Filed Vitals:   06/08/14 1150  BP: 97/50  Pulse: 50  Temp:   Resp: 17    Post-op Vital Signs: stable   Complications: No apparent anesthesia complications

## 2014-06-08 NOTE — Interval H&P Note (Signed)
History and Physical Interval Note:  06/08/2014 9:06 AM  [name]  has presented today for surgery, with the diagnosis of hem. positive  The various methods of treatment have been discussed with the patient and family. After consideration of risks, benefits and other options for treatment, the patient has consented to  Procedure(s): COLONOSCOPY WITH PROPOFOL (N/A) as a surgical intervention .  The patient's history has been reviewed, patient examined, no change in status, stable for surgery.  I have reviewed the patient's chart and labs.  Questions were answered to the patient's satisfaction.     [place] C.

## 2014-06-08 NOTE — H&P (Signed)
  Date of Initial H&P: 05/09/14  History reviewed, patient examined, no change in status, stable for surgery.

## 2014-06-09 ENCOUNTER — Encounter (HOSPITAL_COMMUNITY): Payer: Self-pay | Admitting: Gastroenterology

## 2014-06-13 ENCOUNTER — Encounter: Payer: Medicare Other | Admitting: *Deleted

## 2014-06-13 ENCOUNTER — Telehealth: Payer: Self-pay | Admitting: Cardiology

## 2014-06-13 NOTE — Telephone Encounter (Signed)
Spoke with pt and reminded pt of remote transmission that is due today. Pt verbalized understanding.   

## 2014-06-15 ENCOUNTER — Encounter: Payer: Self-pay | Admitting: Cardiology

## 2014-06-21 DIAGNOSIS — E119 Type 2 diabetes mellitus without complications: Secondary | ICD-10-CM | POA: Diagnosis not present

## 2014-06-21 DIAGNOSIS — G453 Amaurosis fugax: Secondary | ICD-10-CM | POA: Diagnosis not present

## 2014-06-21 LAB — HM DIABETES EYE EXAM

## 2014-06-27 ENCOUNTER — Encounter: Payer: Medicare Other | Admitting: Internal Medicine

## 2014-06-29 ENCOUNTER — Encounter (HOSPITAL_COMMUNITY): Payer: Self-pay

## 2014-06-29 ENCOUNTER — Ambulatory Visit (HOSPITAL_COMMUNITY)
Admission: RE | Admit: 2014-06-29 | Discharge: 2014-06-29 | Disposition: A | Discharge status: Home / Self Care | Payer: Medicare Other | Source: Ambulatory Visit | Attending: Internal Medicine | Admitting: Internal Medicine

## 2014-06-29 VITALS — BP 86/55 | HR 50 | Resp 18 | Wt 191.8 lb

## 2014-06-29 DIAGNOSIS — I2510 Atherosclerotic heart disease of native coronary artery without angina pectoris: Secondary | ICD-10-CM | POA: Insufficient documentation

## 2014-06-29 DIAGNOSIS — I5022 Chronic systolic (congestive) heart failure: Secondary | ICD-10-CM | POA: Diagnosis not present

## 2014-06-29 DIAGNOSIS — I340 Nonrheumatic mitral (valve) insufficiency: Secondary | ICD-10-CM | POA: Insufficient documentation

## 2014-06-29 DIAGNOSIS — N183 Chronic kidney disease, stage 3 unspecified: Secondary | ICD-10-CM

## 2014-06-29 DIAGNOSIS — Z95810 Presence of automatic (implantable) cardiac defibrillator: Secondary | ICD-10-CM | POA: Diagnosis not present

## 2014-06-29 DIAGNOSIS — I255 Ischemic cardiomyopathy: Secondary | ICD-10-CM | POA: Insufficient documentation

## 2014-06-29 DIAGNOSIS — I4729 Other ventricular tachycardia: Secondary | ICD-10-CM

## 2014-06-29 DIAGNOSIS — Z794 Long term (current) use of insulin: Secondary | ICD-10-CM | POA: Insufficient documentation

## 2014-06-29 DIAGNOSIS — Z7982 Long term (current) use of aspirin: Secondary | ICD-10-CM | POA: Diagnosis not present

## 2014-06-29 DIAGNOSIS — I472 Ventricular tachycardia: Secondary | ICD-10-CM | POA: Diagnosis not present

## 2014-06-29 DIAGNOSIS — Z951 Presence of aortocoronary bypass graft: Secondary | ICD-10-CM | POA: Insufficient documentation

## 2014-06-29 DIAGNOSIS — I129 Hypertensive chronic kidney disease with stage 1 through stage 4 chronic kidney disease, or unspecified chronic kidney disease: Secondary | ICD-10-CM | POA: Diagnosis not present

## 2014-06-29 DIAGNOSIS — E119 Type 2 diabetes mellitus without complications: Secondary | ICD-10-CM | POA: Diagnosis not present

## 2014-06-29 DIAGNOSIS — Z79899 Other long term (current) drug therapy: Secondary | ICD-10-CM | POA: Insufficient documentation

## 2014-06-29 MED ORDER — METOLAZONE 2.5 MG PO TABS
2.5000 mg | ORAL_TABLET | ORAL | Status: DC | PRN
Start: 2014-06-29 — End: 2015-01-22

## 2014-06-29 NOTE — Patient Instructions (Signed)
Doing great!  Follow up 2 months.  Do the following things EVERYDAY: 1) Weigh yourself in the morning before breakfast. Write it down and keep it in a log. 2) Take your medicines as prescribed 3) Eat low salt foods-Limit salt (sodium) to 2000 mg per day.  4) Stay as active as you can everyday 5) Limit all fluids for the day to less than 2 liters

## 2014-06-29 NOTE — Progress Notes (Signed)
Patient ID: [name], female   DOB: [birth year], 57 y.o.   MRN: [id number] PCP: Dr. Nicoletta Dress (Internal Medicine)  HPI: 59 year old female with history of HTN, DM2, past polysubstance abuse (ETOH, tobacco, cocaine), CAD s/p CABG x5 with MV annuloplasty (2007), ICM s/p ICD  (CRT upgrade 77/82), chronic systolic HF EF 42%, severe MR, CKD stage III and PAD. Blood type O+  Presented in 2007 with acute anterior MI with totalled LAD in setting of cocaine use. At time of cath LAD, LCX and RCA occluded. EF 45%. Had abrupt stent occlusion the next day and had to go back to the lab. 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   Admitted 8/2-10/07/13 for syncope s/p ICD shock. Concern that VT was possibly from ischemia and taken for [facility]. During cath patient had vagal response and BP dropped to 50s and co-ox was in the upper 40s. Placed on Levophed and transferred to floor. Started on Amiodarone.   On 11/25/13 underwent RHC for low output symptoms. Hemodynamic borderline. Swan left in and she was observed. Cardiac output dropped while in hospital so started on milrinone. Discharged home. Underwent CRT-D upgrade on 12/02/13  In 12/15, she was admitted with catheter infection. She was admitted and catheter was replaced.  She has completed antibiotic.    She returns for follow up. Milrinone and PICC removed 04/12/2014 due to pain. Had colonoscopy 4/7 with several polyps removed. She will need colonoscopy every 2 years . Overall having more good days than bad. Denies SOB/PND/Orthopnea. Taking all medications. Weight at home 189-191.  Able to walk 1/2 mile.   Optivol: Today well below threshold.  and flat. Activity 3-4 hours. No VT   05/19/2012  EF 15% 2/15 EF 15%, diffuse hypokinesis, restrictive diastolic function, s/p MV repair with moderate-severe MR.  4/15: EF 15%, severe MR, mod TR  CPX: 06/17/12 Peak VO2 14.8 (predicted peak VO2 68.5%), VE/VCO2 slope 43.4 OUES: 1.19,  Peak RER 1.12 Vent threshold 11.3 (pred peak VO2 52.3%)  CPX (3/15): peak VO2 15.6 (predicted peak VO2 74.5%) VE/VO2 40.8, RER 1.2  LHC (10/2013) 1) severe native CAD with all bypass grafts patent  11/01/13 RHC RA = 5  RV = 47/3/10  PA = 54/16 (30)  PCW = 14 v = 20  Fick cardiac output/index = 4.8/2.6  Thermo CO/CI = 3.7/2.0  PVR = 4.3 WU  FA sat = 95%  PA sat = 59%, 59%   Labs:  08/12/12 - on lipitor 40 mg daily Cholesterol 180 Triglyceride 195 HDL 29 LDL 112 K 4.5, creatinine 1.4 11/16/12 K 4.0 Labs 1.5 Pro BNP 181 02/14/13: K+ 4.6, Cr 1.33, Dig level 1.8, TSH 2.97 3/15: K 4.2, creatinine 1.07, HCT 32.4 06/2013: Dig level 0.3, pro-BNP 899, Cholesterol 140, TG 106, HDL 36, LDL 82, Cr 1.5, K+ 4.9 11/11/13: K 4.1 Creatinine 1.8  01/31/14 Creatinine 1.48 K .5  02/07/14:  K 4.2 Creatinine 1.75 Magnesium 2.2 after sprionolactone 12.5 mg  was added  12/15: K 3.8, creatinine 1.32  04/04/2014: K 4.8 Creatinine 1.59 06/08/2014: K 3.3 Creatinine 1.47   FH: Mother deceased: CAD, DM2, HTN        Father deceased: stroke  SH: Works odds/end jobs for [employer]; disabled. Lives in [place] with 2 sons([name] and [name]).   ROS: All systems negative except as listed in HPI, PMH and Problem List.  Past Medical History  Diagnosis Date  . Coronary artery disease  a. s/p CABG x 5 with MV annuloplasty 2007   . Diabetes mellitus   . Hypertension   . Chronic systolic heart failure     a. ICM b. ECHO (06/2013): EF 15%, severe MR (06/2013) c. RHC (10/2013): RA 5, RV 23/2/5, PA 25/6 (14), PCWP 12, Fick CO/CI: 3.2/1.7, PVR 0.7 WU, PA 45%, 47% and 55% (with levophed 5), vagal response during cath. d. On home milrinone.  . Polysubstance abuse     history of  (cocaine, tobacco and ETOH)  . V-tach   . Ischemic cardiomyopathy     a. s/p ICD. b. LHC (10/04/13): 1. Severe native CAD with all bypass grafts patent. c. CRT upgrade 12/2013.  . Implantable defibrillator   medtronic   . LBBB (left bundle branch  block)   . AICD (automatic cardioverter/defibrillator) present   . PICC line infection     a. 01/2014 - PICC exchanged.  . CKD (chronic kidney disease), stage III   . PAD (peripheral artery disease)   . Lipoma     Current Outpatient Prescriptions  Medication Sig Dispense Refill  . acetaminophen (TYLENOL) 325 MG tablet Take 650 mg by mouth every 6 (six) hours as needed for mild pain.    [name] allopurinol (ZYLOPRIM) 100 MG tablet Take 1-2 tablets (100-200 mg total) by mouth 2 (two) times daily. Takes 200 mg in the morning and 100 mg in the evening 90 tablet 3  . amiodarone (PACERONE) 200 MG tablet TAKE ONE TABLET BY MOUTH DAILY (Patient taking differently: Take one tablet daily in the morning.) 30 tablet 0  . aspirin EC 81 MG tablet Take 81 mg by mouth every morning.     [name] atorvastatin (LIPITOR) 80 MG tablet Take 1 tablet (80 mg total) by mouth daily. 30 tablet 11  . digoxin (LANOXIN) 0.125 MG tablet Take 0.5 tablets (0.0625 mg total) by mouth every other day. 15 tablet 3  . furosemide (LASIX) 80 MG tablet Take 80 mg in AM and 40 mg in PM 90 tablet 3  . glucose blood (ACCU-CHEK AVIVA PLUS) test strip The patient is insulin requiring, ICD 10 code E11.9. The patient tests 3 times per day. 100 each 12  . Insulin Glargine (LANTUS SOLOSTAR) 100 UNIT/ML Solostar Pen Inject 30 Units into the skin at bedtime. 15 mL 5  . insulin lispro (HUMALOG KWIKPEN) 100 UNIT/ML KiwkPen Inject 0.05 mLs (5 Units total) into the skin 3 (three) times daily before meals. Insulin requiring, ICD 10 code E11.9. Tests TID. 15 mL 11  . Insulin Pen Needle 31G X 8 MM MISC 1 each by Does not apply route 3 (three) times daily. Inject insulin 3 times daily.  Insulin dependent DM E11.9. 100 each 11  . losartan (COZAAR) 25 MG tablet TAKE ONE-HALF TABLET BY NIGHTLY AT BEDTIME 15 tablet 4  . metolazone (ZAROXOLYN) 2.5 MG tablet Take 1 tablet (2.5 mg total) by mouth as needed. (Patient taking differently: Take 2.5 mg by mouth 2 (two) times  a week. Monday and Friday in the am.) 15 tablet 3  . nitroGLYCERIN (NITROSTAT) 0.4 MG SL tablet Place 1 tablet (0.4 mg total) under the tongue every 5 (five) minutes as needed. For chest pain. 25 tablet 11  . potassium chloride SA (K-DUR,KLOR-CON) 20 MEQ tablet Take 1 tablet (20 mEq total) by mouth every morning. Take extra tablet (62mq) if you take lasix 60 tablet 30  . [DISCONTINUED] sitaGLIPtin (JANUVIA) 25 MG tablet Take 1 tablet (25 mg total) by mouth daily. 3[place]  tablet 1   No current facility-administered medications for this encounter.    Filed Vitals:   06/29/14 1034  BP: 86/55  Pulse: 50  Resp: 18  Weight: 191 lb 12 oz (86.977 kg)  SpO2: 95%   PHYSICAL EXAM: General:  Well appearing. No resp difficulty .  HEENT: normal Neck: supple. JVP flat  Carotids 2+ bilaterally; no bruits. No lymphadenopathy or thryomegaly appreciated. Cor: PMI normal. Regular rate & rhythm. 2/6 SEM LSB 2/6 MR  no s3  R upper chest tunneled catheter that is tender to touch and has slight yellowish drainage Lungs: clear Abdomen: obese,soft, nontender, mildly distended. No hepatosplenomegaly. No bruits or masses. Good bowel sounds. Extremities: no cyanosis, clubbing, rash, edema. Neuro: alert & orientedx3, cranial nerves grossly intact. Moves all 4 extremities w/o difficulty. Affect pleasant.   ASSESSMENT & PLAN:  1. Chronic Systolic Heart Failure: Ischemic cardiomyopathy s/p CRT-D, EF 15% (4/15).  She was seen in transplant clinic at [facility] last week and the main concern was  keloids if LVAD pursued. Evaluated by Dr Migdalia Dk, plastic surgeon due to keloid however she was not concerned and felt that could be managed as an oupatiet.   She is blood type O so will be difficult.  May need LVAD prior to getting transplant. Had colonoscopy 4/7 with 12 polyps.-->. Plan for colonoscopy every 2 years.    Milrinone stopped in February due scar and pain from PICC and she seems to be doing great.  - NYHA II. Volume status  stable and verified with optivol. Continue lasix 80 mg in am and 40 mg in pm. Can change metolazone to as needed.  Continue losartan and digoxin at current doses.  -Not on Spironolactone due to rise in creatinine.   - Reinforced the need and importance of daily weights, a low sodium diet, and fluid restriction (less than 2 L a day). Instructed to call the HF clinic if weight increases more than 3 lbs overnight or 5 lbs in a week.  2. CAD: No chest pain.  Continue ASA and statin.  3. Mitral regurgitation: s/p mitral valve annuloplasty with CABG. Moderate to severe MR on echo in 4/15. TEE (06/2013) mitral regurg appears severe, anatomy of repaired valve does not look suitable for MV clipping and would be high risk for MV replacement.  4. CKD stage III: Last creatinine 1.47 5.  NSVT: denies any palpitations. Continue amiodarone 200 mg daily. She understands she needs yearly eye exams. TSH ok 01/2014        Follow up in 8 weeks.    CLEGG,AMY NP-C  06/29/2014

## 2014-06-30 ENCOUNTER — Encounter: Payer: Self-pay | Admitting: *Deleted

## 2014-07-03 ENCOUNTER — Encounter: Payer: Self-pay | Admitting: *Deleted

## 2014-07-04 DIAGNOSIS — I2510 Atherosclerotic heart disease of native coronary artery without angina pectoris: Secondary | ICD-10-CM | POA: Diagnosis not present

## 2014-07-04 DIAGNOSIS — Z951 Presence of aortocoronary bypass graft: Secondary | ICD-10-CM | POA: Diagnosis not present

## 2014-07-04 DIAGNOSIS — Z794 Long term (current) use of insulin: Secondary | ICD-10-CM | POA: Diagnosis not present

## 2014-07-04 DIAGNOSIS — Z7982 Long term (current) use of aspirin: Secondary | ICD-10-CM | POA: Diagnosis not present

## 2014-07-04 DIAGNOSIS — Z79899 Other long term (current) drug therapy: Secondary | ICD-10-CM | POA: Diagnosis not present

## 2014-07-04 DIAGNOSIS — I5022 Chronic systolic (congestive) heart failure: Secondary | ICD-10-CM | POA: Diagnosis not present

## 2014-07-04 DIAGNOSIS — F17210 Nicotine dependence, cigarettes, uncomplicated: Secondary | ICD-10-CM | POA: Diagnosis not present

## 2014-07-04 DIAGNOSIS — I739 Peripheral vascular disease, unspecified: Secondary | ICD-10-CM | POA: Diagnosis not present

## 2014-07-04 DIAGNOSIS — E119 Type 2 diabetes mellitus without complications: Secondary | ICD-10-CM | POA: Diagnosis not present

## 2014-07-04 DIAGNOSIS — I272 Other secondary pulmonary hypertension: Secondary | ICD-10-CM | POA: Diagnosis not present

## 2014-07-17 ENCOUNTER — Telehealth (HOSPITAL_COMMUNITY): Payer: Self-pay | Admitting: Vascular Surgery

## 2014-07-17 ENCOUNTER — Other Ambulatory Visit (HOSPITAL_COMMUNITY): Payer: Self-pay | Admitting: Internal Medicine

## 2014-07-17 NOTE — Telephone Encounter (Signed)
REFILL Furosemide , Lipitor

## 2014-07-20 ENCOUNTER — Other Ambulatory Visit (HOSPITAL_COMMUNITY): Payer: Self-pay | Admitting: Cardiology

## 2014-07-20 MED ORDER — ATORVASTATIN CALCIUM 80 MG PO TABS: 80.0000 mg | ORAL_TABLET | Freq: Every day | ORAL | Status: DC

## 2014-07-20 MED ORDER — FUROSEMIDE 80 MG PO TABS: ORAL_TABLET | ORAL | Status: DC

## 2014-08-08 ENCOUNTER — Encounter: Payer: Self-pay | Admitting: Internal Medicine

## 2014-08-29 ENCOUNTER — Ambulatory Visit (HOSPITAL_COMMUNITY)
Admission: RE | Admit: 2014-08-29 | Discharge: 2014-08-29 | Disposition: A | Discharge status: Home / Self Care | Payer: Medicare Other | Source: Ambulatory Visit | Attending: Internal Medicine | Admitting: Internal Medicine

## 2014-08-29 ENCOUNTER — Other Ambulatory Visit (HOSPITAL_COMMUNITY): Payer: Self-pay | Admitting: Cardiology

## 2014-08-29 VITALS — BP 98/52 | HR 64 | Wt 194.0 lb

## 2014-08-29 DIAGNOSIS — I255 Ischemic cardiomyopathy: Secondary | ICD-10-CM | POA: Diagnosis not present

## 2014-08-29 DIAGNOSIS — I5022 Chronic systolic (congestive) heart failure: Secondary | ICD-10-CM | POA: Insufficient documentation

## 2014-08-29 DIAGNOSIS — I739 Peripheral vascular disease, unspecified: Secondary | ICD-10-CM | POA: Diagnosis not present

## 2014-08-29 DIAGNOSIS — E669 Obesity, unspecified: Secondary | ICD-10-CM | POA: Insufficient documentation

## 2014-08-29 DIAGNOSIS — E119 Type 2 diabetes mellitus without complications: Secondary | ICD-10-CM | POA: Diagnosis not present

## 2014-08-29 DIAGNOSIS — Z79899 Other long term (current) drug therapy: Secondary | ICD-10-CM | POA: Diagnosis not present

## 2014-08-29 DIAGNOSIS — Z951 Presence of aortocoronary bypass graft: Secondary | ICD-10-CM | POA: Diagnosis not present

## 2014-08-29 DIAGNOSIS — N183 Chronic kidney disease, stage 3 (moderate): Secondary | ICD-10-CM | POA: Insufficient documentation

## 2014-08-29 DIAGNOSIS — Z6740 Type O blood, Rh positive: Secondary | ICD-10-CM | POA: Diagnosis not present

## 2014-08-29 DIAGNOSIS — I472 Ventricular tachycardia, unspecified: Secondary | ICD-10-CM

## 2014-08-29 DIAGNOSIS — I129 Hypertensive chronic kidney disease with stage 1 through stage 4 chronic kidney disease, or unspecified chronic kidney disease: Secondary | ICD-10-CM | POA: Diagnosis not present

## 2014-08-29 DIAGNOSIS — Z794 Long term (current) use of insulin: Secondary | ICD-10-CM | POA: Insufficient documentation

## 2014-08-29 DIAGNOSIS — I2510 Atherosclerotic heart disease of native coronary artery without angina pectoris: Secondary | ICD-10-CM | POA: Insufficient documentation

## 2014-08-29 DIAGNOSIS — M545 Low back pain: Secondary | ICD-10-CM | POA: Diagnosis not present

## 2014-08-29 DIAGNOSIS — I340 Nonrheumatic mitral (valve) insufficiency: Secondary | ICD-10-CM | POA: Insufficient documentation

## 2014-08-29 DIAGNOSIS — Z95810 Presence of automatic (implantable) cardiac defibrillator: Secondary | ICD-10-CM | POA: Diagnosis not present

## 2014-08-29 DIAGNOSIS — Z7982 Long term (current) use of aspirin: Secondary | ICD-10-CM | POA: Diagnosis not present

## 2014-08-29 MED ORDER — LOSARTAN POTASSIUM 25 MG PO TABS: ORAL_TABLET | ORAL | Status: DC

## 2014-08-29 MED ORDER — ATORVASTATIN CALCIUM 80 MG PO TABS: 80.0000 mg | ORAL_TABLET | Freq: Every day | ORAL | Status: DC

## 2014-08-29 NOTE — Patient Instructions (Signed)
INCREASE Losartan to 12.5 mg, twice a daily  Labs today and again in 2 weeks  Your physician has recommended that you have a cardiopulmonary stress test (CPX). CPX testing is a non-invasive measurement of heart and lung function. It replaces a traditional treadmill stress test. This type of test provides a tremendous amount of information that relates not only to your present condition but also for future outcomes. This test combines measurements of you ventilation, respiratory gas exchange in the lungs, electrocardiogram (EKG), blood pressure and physical response before, during, and following an exercise protocol.  Your physician has requested that you have an ankle brachial index (ABI). During this test an ultrasound and blood pressure cuff are used to evaluate the arteries that supply the arms and legs with blood. Allow thirty minutes for this exam. There are no restrictions or special instructions.  Your physician recommends that you schedule a follow-up appointment in: 2 months  Do the following things EVERYDAY: 1) Weigh yourself in the morning before breakfast. Write it down and keep it in a log. 2) Take your medicines as prescribed 3) Eat low salt foods-Limit salt (sodium) to 2000 mg per day.  4) Stay as active as you can everyday 5) Limit all fluids for the day to less than 2 liters 6)

## 2014-08-29 NOTE — Progress Notes (Signed)
Patient ID: [name], female   DOB: [birth year], 57 y.o.   MRN: [id number] PCP: Dr. Trudee Kuster (Internal Medicine)  HPI: 59 year old female with history of HTN, DM2, past polysubstance abuse (ETOH, tobacco, cocaine), CAD s/p CABG x5 with MV annuloplasty (2007), ischemic cardiomyopathy s/p Medtronic CRT-D, chronic systolic HF EF 40%, severe MR, CKD stage III and PAD. Blood type O+.  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 At time of cath LAD, LCX and RCA occluded. EF 45%. Had abrupt stent occlusion the next day and had to go back to the lab. 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   Amitted 8/2-10/07/13 for syncope s/p ICD shock. Concern that VT was possibly from ischemia and taken for [facility]. During cath patient had vagal response and BP dropped to 50s and co-ox was in the upper 40s. Placed on Levophed and transferred to floor. Started on Amiodarone.   On 11/25/13 underwent RHC for low output symptoms. Hemodynamic borderline. Swan left in and she was observed. Cardiac output dropped while in hospital so started on milrinone. Discharged home. Underwent CRT-D upgrade on 12/02/13  In 12/15, she was admitted with catheter infection. She was admitted and catheter was replaced.  She has completed antibiotic.    She has been titrated off milrinone.   She returns for followup. She says since her recent increase of lasix she has noticed her breathing has gotten much better. Hasn't had any "bad days" since she last saw [place]. She did have one weight at 199 lbs at home but states this was likely due to dietary indiscretion, she took an extra Lasix and had no other problems. Denies SOB/PND/Orthopnea. Taking all medications. Weight at home 180s.  Still walks around 1/2 mile with no difficulty. Does have some low back pain when lying flat in bed.  Denies radiation into legs, denies numbness or tingling, or loss of bowel or bladder  function.  Occasional left calf cramp after a long walk.   Optivol: fluid well below threshold with stable impedance, No VT/AF noted, 3-4 hrs of activity daily.  05/19/2012  EF 15% 2/15 EF 15%, diffuse hypokinesis, restrictive diastolic function, s/p MV repair with moderate-severe MR.  4/15: EF 15%, severe MR, mod TR  CPX: 06/17/12 Peak VO2 14.8 (predicted peak VO2 68.5%), VE/VCO2 slope 43.4 OUES: 1.19, Peak RER 1.12 Vent threshold 11.3 (pred peak VO2 52.3%)  CPX (3/15): peak VO2 15.6 (predicted peak VO2 74.5%) VE/VO2 40.8, RER 1.2  LHC (10/2013) 1) severe native CAD with all bypass grafts patent  11/01/13 RHC RA = 5  RV = 47/3/10  PA = 54/16 (30)  PCW = 14 v = 20  Fick cardiac output/index = 4.8/2.6  Thermo CO/CI = 3.7/2.0  PVR = 4.3 WU  FA sat = 95%  PA sat = 59%, 59%   Labs:  08/12/12 - on lipitor 40 mg daily Cholesterol 180 Triglyceride 195 HDL 29 LDL 112 K 4.5, creatinine 1.4 11/16/12 K 4.0 Labs 1.5 Pro BNP 181 02/14/13: K+ 4.6, Cr 1.33, Dig level 1.8, TSH 2.97 3/15: K 4.2, creatinine 1.07, HCT 32.4 06/2013: Dig level 0.3, pro-BNP 899, Cholesterol 140, TG 106, HDL 36, LDL 82, Cr 1.5, K+ 4.9 11/11/13: K 4.1 Creatinine 1.8  01/31/14 Creatinine 1.48 K .5  02/07/14:  K 4.2 Creatinine 1.75 Magnesium 2.2 after sprionolactone 12.5 mg  was added  12/15: K 3.8, creatinine 1.32  04/04/2014: K 4.8 Creatinine 1.59 06/08/2014: K 3.3 Creatinine 1.47   FH: Mother deceased: CAD, DM2, HTN        Father deceased: stroke  SH: Works odds/end jobs for [employer]; disabled. Lives in [place] with 2 sons([name] and [name]).   ROS: All systems negative except as listed in HPI, PMH and Problem List.  Past Medical History  Diagnosis Date  . Coronary artery disease     a. s/p CABG x 5 with MV annuloplasty 2007   . Diabetes mellitus   . Hypertension   . Chronic systolic heart failure     a. ICM b. ECHO (06/2013): EF 15%, severe MR (06/2013) c. RHC (10/2013): RA 5, RV 23/2/5, PA 25/6 (14), PCWP 12,  Fick CO/CI: 3.2/1.7, PVR 0.7 WU, PA 45%, 47% and 55% (with levophed 5), vagal response during cath. d. On home milrinone.  . Polysubstance abuse     history of  (cocaine, tobacco and ETOH)  . V-tach   . Ischemic cardiomyopathy     a. s/p ICD. b. LHC (10/04/13): 1. Severe native CAD with all bypass grafts patent. c. CRT upgrade 12/2013.  . Implantable defibrillator   medtronic   . LBBB (left bundle branch block)   . AICD (automatic cardioverter/defibrillator) present   . PICC line infection     a. 01/2014 - PICC exchanged.  . CKD (chronic kidney disease), stage III   . PAD (peripheral artery disease)   . Lipoma     Current Outpatient Prescriptions  Medication Sig Dispense Refill  . acetaminophen (TYLENOL) 325 MG tablet Take 650 mg by mouth every 6 (six) hours as needed for mild pain.    [name] allopurinol (ZYLOPRIM) 100 MG tablet Take 1-2 tablets (100-200 mg total) by mouth 2 (two) times daily. Takes 200 mg in the morning and 100 mg in the evening 90 tablet 3  . amiodarone (PACERONE) 200 MG tablet TAKE ONE TABLET BY MOUTH DAILY (Patient taking differently: Take one tablet daily in the morning.) 30 tablet 0  . aspirin EC 81 MG tablet Take 81 mg by mouth every morning.     [name] atorvastatin (LIPITOR) 80 MG tablet Take 1 tablet (80 mg total) by mouth daily. 30 tablet 3  . digoxin (LANOXIN) 0.125 MG tablet Take 0.5 tablets (0.0625 mg total) by mouth every other day. 15 tablet 3  . furosemide (LASIX) 80 MG tablet Take 80 mg in AM and 40 mg in PM 90 tablet 3  . glucose blood (ACCU-CHEK AVIVA PLUS) test strip The patient is insulin requiring, ICD 10 code E11.9. The patient tests 3 times per day. 100 each 12  . Insulin Glargine (LANTUS SOLOSTAR) 100 UNIT/ML Solostar Pen Inject 30 Units into the skin at bedtime. 15 mL 5  . insulin lispro (HUMALOG KWIKPEN) 100 UNIT/ML KiwkPen Inject 0.05 mLs (5 Units total) into the skin 3 (three) times daily before meals. Insulin requiring, ICD 10 code E11.9. Tests TID. 15 mL  11  . Insulin Pen Needle 31G X 8 MM MISC 1 each by Does not apply route 3 (three) times daily. Inject insulin 3 times daily.  Insulin dependent DM E11.9. 100 each 11  . losartan (COZAAR) 25 MG tablet TAKE ONE-HALF TABLET BY NIGHTLY AT BEDTIME 15 tablet 4  . metolazone (ZAROXOLYN) 2.5 MG tablet Take 1 tablet (2.5 mg total) by mouth as needed. 15 tablet 3  . nitroGLYCERIN (NITROSTAT) 0.4 MG SL tablet Place 1 tablet (0.4 mg total) under the tongue every 5 (five)  minutes as needed. For chest pain. 25 tablet 11  . potassium chloride SA (K-DUR,KLOR-CON) 20 MEQ tablet Take 1 tablet (20 mEq total) by mouth every morning. Take extra tablet (60mq) if you take lasix 60 tablet 30  . [DISCONTINUED] sitaGLIPtin (JANUVIA) 25 MG tablet Take 1 tablet (25 mg total) by mouth daily. 30 tablet 1   No current facility-administered medications for this encounter.    Filed Vitals:   08/29/14 1021  BP: 98/52  Pulse: 64  Weight: 194 lb (87.998 kg)  SpO2: 95%   PHYSICAL EXAM: General:  Well appearing. No resp difficulty .  HEENT: normal Neck: supple. JVP flat  Carotids 2+ bilaterally; no bruits. No lymphadenopathy or thryomegaly appreciated. Cor: PMI normal. Regular rate & rhythm. 2/6 SEM LSB 2/6 MR, no s3   Lungs: clear Abdomen: obese,soft, nontender, mildly distended. No hepatosplenomegaly. No bruits or masses. Good bowel sounds. Extremities: no cyanosis, clubbing, rash, trace edema. Large scar medial portion of right calf. Neuro: alert & orientedx3, cranial nerves grossly intact. Moves all 4 extremities w/o difficulty. Affect pleasant.  ASSESSMENT & PLAN:  1. Chronic systolic CHF: Ischemic cardiomyopathy s/p Medtronic CRT-D, EF 15% (4/15).  [facility]  She is blood type O so matching for transplant may be difficult.  She may need LVAD prior to getting transplant, but doing very well currently.  She is doing well currently off milrinone, possibly due to CRT upgrade.   NYHA class II symptoms.  She does not appear volume overloaded.  - Had colonoscopy 4/7 with 12 polyps -->Plan for colonoscopy every 2 years.    - Continue digoxin, check level today.  - Continue current Lasix.  Taking Metolazone on Monday or Friday, but at least once a week.  BMET today.  - Increase losartan to 12.5 mg BID with BMET in 2 wks. - Will aim to get her on spironolactone at next appointment.    - Will repeat CPX.  - Reinforced the need and importance of daily weights, a low sodium diet, and fluid restriction (less than 2 L a day). Instructed to call the HF clinic if weight increases more than 3 lbs overnight or 5 lbs in a week.  2. CAD: No chest pain.  Continue ASA and statin.  3. Mitral regurgitation: s/p mitral valve annuloplasty with CABG in 2007. Moderate to severe MR on echo in 4/15. TEE (06/2013) mitral regurgitation appeared severe, anatomy of repaired valve does not look suitable for MV clipping and would be high risk for MV replacement.  4. CKD stage III: Last creatinine 1.47, will recheck today. 5.  NSVT: denies any palpitations. Continue amiodarone 200 mg daily. Needs yearly eye exams, just had one. Check LFTs and TSH. 6. Obesity:  Wants to take Garcinia Cambogia (Hydroxycitric acid).  Studies are mixed with benefit vs no benefit, Some show potential for liver toxicity - Told her ok to try, but stop if she notices no benefit.  Will follow LFTs closely. 7. PAD: Mild claudication.  Plan ABIs in 8/16.    DLoralie Champagne6/28/2016

## 2014-08-30 ENCOUNTER — Ambulatory Visit (HOSPITAL_COMMUNITY)
Admission: RE | Admit: 2014-08-30 | Discharge: 2014-08-30 | Disposition: A | Discharge status: Home / Self Care | Payer: Medicare Other | Source: Ambulatory Visit | Attending: Internal Medicine | Admitting: Internal Medicine

## 2014-08-30 ENCOUNTER — Other Ambulatory Visit (HOSPITAL_COMMUNITY): Payer: Self-pay | Admitting: *Deleted

## 2014-08-30 ENCOUNTER — Ambulatory Visit (HOSPITAL_COMMUNITY): Payer: Medicare Other | Attending: Cardiovascular Disease

## 2014-08-30 ENCOUNTER — Other Ambulatory Visit (HOSPITAL_COMMUNITY): Payer: Self-pay | Admitting: Cardiology

## 2014-08-30 ENCOUNTER — Ambulatory Visit (HOSPITAL_COMMUNITY): Payer: Medicare Other

## 2014-08-30 DIAGNOSIS — I739 Peripheral vascular disease, unspecified: Secondary | ICD-10-CM

## 2014-08-30 DIAGNOSIS — I5022 Chronic systolic (congestive) heart failure: Secondary | ICD-10-CM | POA: Diagnosis not present

## 2014-08-30 DIAGNOSIS — I10 Essential (primary) hypertension: Secondary | ICD-10-CM | POA: Insufficient documentation

## 2014-08-30 DIAGNOSIS — Z87891 Personal history of nicotine dependence: Secondary | ICD-10-CM | POA: Diagnosis not present

## 2014-08-30 DIAGNOSIS — E119 Type 2 diabetes mellitus without complications: Secondary | ICD-10-CM | POA: Insufficient documentation

## 2014-08-30 DIAGNOSIS — Z951 Presence of aortocoronary bypass graft: Secondary | ICD-10-CM | POA: Insufficient documentation

## 2014-08-30 DIAGNOSIS — E785 Hyperlipidemia, unspecified: Secondary | ICD-10-CM | POA: Diagnosis not present

## 2014-08-30 DIAGNOSIS — I70213 Atherosclerosis of native arteries of extremities with intermittent claudication, bilateral legs: Secondary | ICD-10-CM | POA: Diagnosis not present

## 2014-08-30 DIAGNOSIS — R0600 Dyspnea, unspecified: Secondary | ICD-10-CM

## 2014-08-30 DIAGNOSIS — I2510 Atherosclerotic heart disease of native coronary artery without angina pectoris: Secondary | ICD-10-CM | POA: Diagnosis not present

## 2014-08-30 LAB — COMPREHENSIVE METABOLIC PANEL
ALT: 72 U/L — ABNORMAL HIGH (ref 14–54)
AST: 48 U/L — ABNORMAL HIGH (ref 15–41)
Albumin: 4.1 g/dL (ref 3.5–5.0)
Alkaline Phosphatase: 89 U/L (ref 38–126)
Anion gap: 12 (ref 5–15)
BUN: 35 mg/dL — ABNORMAL HIGH (ref 6–20)
CALCIUM: 9.2 mg/dL (ref 8.9–10.3)
CO2: 27 mmol/L (ref 22–32)
CREATININE: 1.51 mg/dL — AB (ref 0.44–1.00)
Chloride: 99 mmol/L — ABNORMAL LOW (ref 101–111)
GFR calc Af Amer: 43 mL/min — ABNORMAL LOW (ref >60)
GFR, EST NON AFRICAN AMERICAN: 37 mL/min — AB (ref >60)
GLUCOSE: 102 mg/dL — AB (ref 65–99)
Potassium: 3.4 mmol/L — ABNORMAL LOW (ref 3.5–5.1)
Sodium: 138 mmol/L (ref 135–145)
Total Bilirubin: 0.6 mg/dL (ref 0.3–1.2)
Total Protein: 8.2 g/dL — ABNORMAL HIGH (ref 6.5–8.1)

## 2014-08-30 LAB — CHOLESTEROL, TOTAL: CHOLESTEROL: 223 mg/dL — AB (ref 0–200)

## 2014-08-30 LAB — TSH: TSH: 3.637 u[IU]/mL (ref 0.350–4.500)

## 2014-09-13 ENCOUNTER — Ambulatory Visit (HOSPITAL_COMMUNITY)
Admission: RE | Admit: 2014-09-13 | Discharge: 2014-09-13 | Disposition: A | Discharge status: Home / Self Care | Payer: Medicare Other | Source: Ambulatory Visit | Attending: Cardiology | Admitting: Cardiology

## 2014-09-13 ENCOUNTER — Encounter: Payer: Self-pay | Admitting: Cardiology

## 2014-09-13 ENCOUNTER — Encounter (HOSPITAL_COMMUNITY): Payer: Self-pay

## 2014-09-13 DIAGNOSIS — I5022 Chronic systolic (congestive) heart failure: Secondary | ICD-10-CM | POA: Diagnosis not present

## 2014-09-13 LAB — BASIC METABOLIC PANEL
Anion gap: 8 (ref 5–15)
BUN: 34 mg/dL — ABNORMAL HIGH (ref 6–20)
CALCIUM: 9.3 mg/dL (ref 8.9–10.3)
CO2: 29 mmol/L (ref 22–32)
CREATININE: 1.2 mg/dL — AB (ref 0.44–1.00)
Chloride: 102 mmol/L (ref 101–111)
GFR calc non Af Amer: 49 mL/min — ABNORMAL LOW (ref >60)
GFR, EST AFRICAN AMERICAN: 57 mL/min — AB (ref >60)
Glucose, Bld: 121 mg/dL — ABNORMAL HIGH (ref 65–99)
Potassium: 4 mmol/L (ref 3.5–5.1)
Sodium: 139 mmol/L (ref 135–145)

## 2014-09-21 ENCOUNTER — Other Ambulatory Visit (HOSPITAL_COMMUNITY): Payer: Self-pay

## 2014-09-21 ENCOUNTER — Other Ambulatory Visit (HOSPITAL_COMMUNITY): Payer: Self-pay | Admitting: *Deleted

## 2014-09-21 ENCOUNTER — Telehealth (HOSPITAL_COMMUNITY): Payer: Self-pay | Admitting: Vascular Surgery

## 2014-09-21 MED ORDER — AMOXICILLIN 500 MG PO TABS: ORAL_TABLET | ORAL | Status: DC

## 2014-09-21 NOTE — Telephone Encounter (Signed)
PT called she needs antibiotics for a dentist appt for tomorrow and Monday .[name] Please advise

## 2014-09-21 NOTE — Telephone Encounter (Signed)
Antibiotics sent to [facility]

## 2014-10-02 ENCOUNTER — Telehealth (HOSPITAL_COMMUNITY): Payer: Self-pay | Admitting: Vascular Surgery

## 2014-10-02 ENCOUNTER — Other Ambulatory Visit (HOSPITAL_COMMUNITY): Payer: Self-pay | Admitting: Anesthesiology

## 2014-10-02 DIAGNOSIS — I5022 Chronic systolic (congestive) heart failure: Secondary | ICD-10-CM

## 2014-10-02 MED ORDER — FUROSEMIDE 80 MG PO TABS: ORAL_TABLET | ORAL | Status: DC

## 2014-10-02 NOTE — Telephone Encounter (Signed)
Pt called she has no more refilled on Furosemide

## 2014-10-11 DIAGNOSIS — I5022 Chronic systolic (congestive) heart failure: Secondary | ICD-10-CM | POA: Diagnosis not present

## 2014-10-11 DIAGNOSIS — I2510 Atherosclerotic heart disease of native coronary artery without angina pectoris: Secondary | ICD-10-CM | POA: Diagnosis not present

## 2014-10-11 DIAGNOSIS — Z95810 Presence of automatic (implantable) cardiac defibrillator: Secondary | ICD-10-CM | POA: Diagnosis not present

## 2014-10-11 DIAGNOSIS — E119 Type 2 diabetes mellitus without complications: Secondary | ICD-10-CM | POA: Diagnosis not present

## 2014-10-11 DIAGNOSIS — I739 Peripheral vascular disease, unspecified: Secondary | ICD-10-CM | POA: Diagnosis not present

## 2014-10-12 ENCOUNTER — Telehealth (HOSPITAL_COMMUNITY): Payer: Self-pay | Admitting: *Deleted

## 2014-10-12 NOTE — Telephone Encounter (Signed)
-----   Message from Conrad [place], NP sent at 10/12/2014  2:29 PM EDT ----- Regarding: cardiac rehab Hey   Got a text from transplant NP at [facility] and she wanted to get Hedwig to cardiac rehab at [facility].   Thanks Amy

## 2014-10-12 NOTE — Telephone Encounter (Signed)
Referral sent to cardiac rehab, left pt a VM that rehab will be calling her to schedule

## 2014-10-18 ENCOUNTER — Ambulatory Visit (INDEPENDENT_AMBULATORY_CARE_PROVIDER_SITE_OTHER): Payer: Medicare Other | Admitting: Internal Medicine

## 2014-10-18 VITALS — BP 103/56 | HR 65 | Temp 98.2°F | Wt 200.0 lb

## 2014-10-18 DIAGNOSIS — M25551 Pain in right hip: Secondary | ICD-10-CM | POA: Diagnosis not present

## 2014-10-18 DIAGNOSIS — Z794 Long term (current) use of insulin: Secondary | ICD-10-CM

## 2014-10-18 DIAGNOSIS — I5022 Chronic systolic (congestive) heart failure: Secondary | ICD-10-CM | POA: Diagnosis not present

## 2014-10-18 DIAGNOSIS — K7689 Other specified diseases of liver: Secondary | ICD-10-CM

## 2014-10-18 DIAGNOSIS — K769 Liver disease, unspecified: Secondary | ICD-10-CM

## 2014-10-18 DIAGNOSIS — M25552 Pain in left hip: Secondary | ICD-10-CM | POA: Diagnosis not present

## 2014-10-18 DIAGNOSIS — Z7982 Long term (current) use of aspirin: Secondary | ICD-10-CM | POA: Diagnosis not present

## 2014-10-18 DIAGNOSIS — Z0000 Encounter for general adult medical examination without abnormal findings: Secondary | ICD-10-CM

## 2014-10-18 DIAGNOSIS — E119 Type 2 diabetes mellitus without complications: Secondary | ICD-10-CM | POA: Diagnosis not present

## 2014-10-18 LAB — POCT GLYCOSYLATED HEMOGLOBIN (HGB A1C): Hemoglobin A1C: 7.1

## 2014-10-18 LAB — GLUCOSE, CAPILLARY: Glucose-Capillary: 153 mg/dL — ABNORMAL HIGH (ref 65–99)

## 2014-10-18 MED ORDER — GLUCOSE BLOOD VI STRP: ORAL_STRIP | Status: DC

## 2014-10-18 MED ORDER — ACCU-CHEK FASTCLIX LANCETS MISC: Status: DC

## 2014-10-18 MED ORDER — ACCU-CHEK NANO SMARTVIEW W/DEVICE KIT: PACK | Status: DC

## 2014-10-18 NOTE — Patient Instructions (Signed)
Thank you for your visit.  Please continue to follow with the nutritionist and with your diet and exercise.  For your hip pain, you can continue to use tylenol, and physical therapy may help as well. You can also try water exercises which can help.  Your Hemoglobin A1C was 7.1 today compared to 6.5 in February. We will prescribe your supplies today. You can take 4-6 units of lantus before breakfast or dinner as you have before if your blood sugars are above 150.  We will follow up on your liver testing and let you know if we need to have any other imaging completed.

## 2014-10-19 DIAGNOSIS — I5022 Chronic systolic (congestive) heart failure: Secondary | ICD-10-CM | POA: Diagnosis not present

## 2014-10-19 NOTE — Progress Notes (Signed)
Patient ID: [name], female   DOB: [birth year], 57 y.o.   MRN: [id number]   Subjective:   Patient ID: ELISHA MCGRUDER female   DOB: 10-19-1955 59 y.o.   MRN: [id number]  HPI: Ms.Thanh MERILYNN HAYDU is a 59 y.o. female with PMH of CAD s/p CABG x5 with mitral annuloplasty in 2007, ischemic cardiomyopathy w/ CHF (EF 15%), ICD placement in 2011 upgraded to biventricular device in 2015, and T2DM who presents for a regular follow up. She also has complaints of bilateral hip pain that has been going on for some time, which is not limiting her daily activities.  Please see problem list for details.     Past Medical History  Diagnosis Date  . Coronary artery disease     a. s/p CABG x 5 with MV annuloplasty 2007   . Diabetes mellitus   . Hypertension   . Chronic systolic heart failure     a. ICM b. ECHO (06/2013): EF 15%, severe MR (06/2013) c. RHC (10/2013): RA 5, RV 23/2/5, PA 25/6 (14), PCWP 12, Fick CO/CI: 3.2/1.7, PVR 0.7 WU, PA 45%, 47% and 55% (with levophed 5), vagal response during cath. d. On home milrinone.  . Polysubstance abuse     history of  (cocaine, tobacco and ETOH)  . V-tach   . Ischemic cardiomyopathy     a. s/p ICD. b. LHC (10/04/13): 1. Severe native CAD with all bypass grafts patent. c. CRT upgrade 12/2013.  . Implantable defibrillator   medtronic   . LBBB (left bundle branch block)   . AICD (automatic cardioverter/defibrillator) present   . PICC line infection     a. 01/2014 - PICC exchanged.  . CKD (chronic kidney disease), stage III   . PAD (peripheral artery disease)   . Lipoma    Current Outpatient Prescriptions  Medication Sig Dispense Refill  . ACCU-CHEK FASTCLIX LANCETS MISC Use to test blood glucose 2 times daily. Dx:E11.9 102 each 12  . acetaminophen (TYLENOL) 325 MG tablet Take 650 mg by mouth every 6 (six) hours as needed for mild pain.    [name] allopurinol (ZYLOPRIM) 100 MG tablet Take 1-2 tablets (100-200 mg total) by mouth 2 (two) times daily. Takes 200 mg  in the morning and 100 mg in the evening 90 tablet 3  . amiodarone (PACERONE) 200 MG tablet TAKE ONE TABLET BY MOUTH DAILY (Patient taking differently: Take one tablet daily in the morning.) 30 tablet 0  . amoxicillin (AMOXIL) 500 MG tablet Take 2 tablets 1 hour prior to dental work and 2 tablets after procedure 4 tablet 0  . aspirin EC 81 MG tablet Take 81 mg by mouth every morning.     [name] atorvastatin (LIPITOR) 80 MG tablet Take 1 tablet (80 mg total) by mouth daily. 30 tablet 3  . Blood Glucose Monitoring Suppl (ACCU-CHEK NANO SMARTVIEW) W/DEVICE KIT Use to test blood glucose 2 times daily. Dx:E11.9 1 kit 0  . digoxin (LANOXIN) 0.125 MG tablet Take 0.5 tablets (0.0625 mg total) by mouth every other day. 15 tablet 3  . furosemide (LASIX) 40 MG tablet TAKE TWO TABLETS IN THE MORNING. IF WEIGHT INCREASES MORE THAN 3 LBS DAY OR 5 LBS WEEK TAKE EXTRA ONE TABLET 75 tablet 0  . furosemide (LASIX) 80 MG tablet Take 80 mg in AM and 40 mg in PM 90 tablet 3  . glucose blood (ACCU-CHEK SMARTVIEW) test strip Use to test blood glucose 2 times daily. Dx:E11.9 100 each 12  . Insulin  Glargine (LANTUS SOLOSTAR) 100 UNIT/ML Solostar Pen Inject 30 Units into the skin at bedtime. 15 mL 5  . insulin lispro (HUMALOG KWIKPEN) 100 UNIT/ML KiwkPen Inject 0.05 mLs (5 Units total) into the skin 3 (three) times daily before meals. Insulin requiring, ICD 10 code E11.9. Tests TID. 15 mL 11  . Insulin Pen Needle 31G X 8 MM MISC 1 each by Does not apply route 3 (three) times daily. Inject insulin 3 times daily.  Insulin dependent DM E11.9. 100 each 11  . losartan (COZAAR) 25 MG tablet TAKE ONE-HALF TABLET BY MOUTH TWICE DAILY 30 tablet 4  . metolazone (ZAROXOLYN) 2.5 MG tablet Take 1 tablet (2.5 mg total) by mouth as needed. 15 tablet 3  . nitroGLYCERIN (NITROSTAT) 0.4 MG SL tablet Place 1 tablet (0.4 mg total) under the tongue every 5 (five) minutes as needed. For chest pain. 25 tablet 11  . potassium chloride SA (K-DUR,KLOR-CON)  20 MEQ tablet Take 1 tablet (20 mEq total) by mouth every morning. Take extra tablet (77mq) if you take lasix 60 tablet 30  . [DISCONTINUED] sitaGLIPtin (JANUVIA) 25 MG tablet Take 1 tablet (25 mg total) by mouth daily. 30 tablet 1   No current facility-administered medications for this visit.   Family History  Problem Relation Age of Onset  . Heart attack Mother     Died age 57 . Diabetes Maternal Grandmother   . Heart attack Cousin   . Heart attack Maternal Uncle    Social History   Social History  . Marital Status: Widowed    Spouse Name: N/A  . Number of Children: N/A  . Years of Education: N/A   Occupational History  . disable    Social History Main Topics  . Smoking status: Former Smoker    Types: Cigarettes    Quit date: 03/03/2011  . Smokeless tobacco: Not on file  . Alcohol Use: No  . Drug Use: No     Comment: Has history of cocaine use, denies recent  . Sexual Activity: Not Currently   Other Topics Concern  . Not on file   Social History Narrative   Review of Systems: Review of Systems  Constitutional: Negative for fever and chills.  Respiratory: Positive for shortness of breath. Negative for cough, sputum production and wheezing.   Cardiovascular: Negative for chest pain, palpitations and leg swelling.  Gastrointestinal: Negative for nausea, vomiting, abdominal pain, diarrhea and constipation.  Genitourinary: Negative for dysuria.  Musculoskeletal: Positive for joint pain.  Skin: Negative for rash.  Neurological: Negative for dizziness and headaches.    Objective:  Physical Exam: Filed Vitals:   10/18/14 1440  BP: 103/56  Pulse: 65  Temp: 98.2 F (36.8 C)  TempSrc: Oral  Weight: 200 lb (90.719 kg)  SpO2: 98%   Physical Exam  Constitutional: She is oriented to person, place, and time. She appears well-developed and well-nourished.  HENT:  Head: Normocephalic and atraumatic.  Eyes: EOM are normal.  Cardiovascular: Normal rate and regular  rhythm.   Systolic murmur heard at upper sternal borders  Pulmonary/Chest: Effort normal and breath sounds normal. No respiratory distress. She has no wheezes. She has no rales.  Abdominal: Soft. Bowel sounds are normal. She exhibits no distension. There is no tenderness.  Musculoskeletal: She exhibits no edema.  Mild tenderness at bilateral hips. No pain elicited from straight leg test.  Neurological: She is alert and oriented to person, place, and time.  Psychiatric: She has a normal mood and affect.  Assessment & Plan:  Please see problem based charting for assessment and plan.

## 2014-10-19 NOTE — Assessment & Plan Note (Addendum)
Hgb A1C today is 7.1 compared to 6.5 on 04/21/2014. Patient takes Lantus 30 Units at bedtime and had previously taken Humalog 4-6 Units before meals but stopped due to low blood sugars. She checks her blood glucose twice a day, before breakfast and dinner. She states that she does not have any highs or lows too often with approximate measured levels being 170 and 91 respectively.  -Patient will take 4-6 Units before meals if her CBG is >150. -Follow up with nutritionist at [facility] -Patient to have cardiac rehab at [facility] which will be beneficial -refill diabetic testing supplies

## 2014-10-19 NOTE — Assessment & Plan Note (Addendum)
Patient had colonoscopy on 06/08/2014 in which 12 polyps were seen and resected. 6 polyps sent for surgical pathology show no evidence of malignancy. 2 of which were hyperplastic with no evidence of adenomatous change. Patient denies any bright red blood in stool or melena.

## 2014-10-19 NOTE — Assessment & Plan Note (Addendum)
MRI in 2009: "......10 mm in diameter. The lesion is only visible on the immediate arterial phase postcontrast images....[name] KitchenMarland KitchenI cannot exclude an early focus of hepatocellular carcinoma or hypervascular metastatic disease, and this lesion will require careful CT or MRI follow-up in order to preclude significant change or growth. If the lesion happens to be visible sonographically, then biopsy might be feasible...."  CT of abdomen on 10/06/2013 did not comment on liver lesion. -May need further imaging to reassess, will contact radiology to determine if prior CT abdomen is sufficient follow up

## 2014-10-19 NOTE — Assessment & Plan Note (Signed)
Patient has chronic systolic heart failure and follows at heart failure clinic at [facility]. She had been weaned off milrinone earlier this year with removal of PICC line. She is being evaluated for possible heart transplant. Patient also has ICD which was upgraded to biventricular device in August 2015. She states that her only recent shortness of breath was when she was walking from the parking lot to the clinic. She denies any chest pain or swelling. She is started on Spironolactone 25 mg once daily and instructed to stop her daily potassium and only take it when she takes her metolazone. -Continue to follow recommendations by [facility] HF clinic -Continue with cardiac rehab -Continue Amiodarone 200 mg once daily, Digoxin 0.125 mg every other day, Losartan 25 mg, Metolazone 2.5 mg, Spironolactone 25 mg once daily, Furosemide 80 mg am and 40 mg pm

## 2014-10-19 NOTE — Assessment & Plan Note (Signed)
Patient reports bilateral hip pain for the last 2 or 3 months that is described as a throbbing sensation and worse when standing long periods of time and when laying down. She states that the pain is worse on her right side. She notices the pain more in the morning when she wakes up and at night during bedtime. Pain is relieved with movement/exercise and with tylenol use. Her pain is not limiting her daily activities at this time. Previous x-ray of the left hip showed mild degenerative change. She had similar pain in the past related to her work which subsided after changing jobs. She was referred to sports medicine last year, but did not feel she needed to be seen by them at that time after her symptoms resolved. Discussed with patient the benefits of physical and aquatic therapy and she states that she will start cardiac rehab at [facility] soon which she believes will serve as therapy. -Continue with cardiac rehab -May need PT or sports medicine referral in future if symptoms worsen -Continue with tylenol prn for pain

## 2014-10-20 ENCOUNTER — Other Ambulatory Visit (HOSPITAL_COMMUNITY): Payer: Self-pay | Admitting: *Deleted

## 2014-10-20 ENCOUNTER — Telehealth (HOSPITAL_COMMUNITY): Payer: Self-pay | Admitting: Vascular Surgery

## 2014-10-20 MED ORDER — AMIODARONE HCL 200 MG PO TABS: 200.0000 mg | ORAL_TABLET | Freq: Every day | ORAL | Status: DC

## 2014-10-20 NOTE — Telephone Encounter (Signed)
Pt needs refill Amiodarone

## 2014-10-21 ENCOUNTER — Other Ambulatory Visit (HOSPITAL_COMMUNITY): Payer: Self-pay | Admitting: Adult Health

## 2014-10-26 NOTE — Progress Notes (Signed)
Internal Medicine Clinic Attending  I saw and evaluated the patient.  I personally confirmed the key portions of the history and exam documented by Dr. Patel,Vishal and I reviewed pertinent patient test results.  The assessment, diagnosis, and plan were formulated together and I agree with the documentation in the resident's note.  

## 2014-11-10 ENCOUNTER — Other Ambulatory Visit (HOSPITAL_COMMUNITY): Payer: Self-pay | Admitting: Internal Medicine

## 2014-11-14 ENCOUNTER — Other Ambulatory Visit (HOSPITAL_COMMUNITY): Payer: Self-pay | Admitting: Internal Medicine

## 2014-12-07 ENCOUNTER — Encounter (HOSPITAL_COMMUNITY)
Admission: RE | Admit: 2014-12-07 | Discharge: 2014-12-07 | Disposition: A | Discharge status: Home / Self Care | Payer: Medicare Other | Source: Ambulatory Visit | Attending: Internal Medicine | Admitting: Internal Medicine

## 2014-12-07 DIAGNOSIS — Z79899 Other long term (current) drug therapy: Secondary | ICD-10-CM | POA: Insufficient documentation

## 2014-12-07 DIAGNOSIS — I5022 Chronic systolic (congestive) heart failure: Secondary | ICD-10-CM | POA: Insufficient documentation

## 2014-12-07 NOTE — Progress Notes (Signed)
Cardiac Rehab Medication Review by a Pharmacist  Does the patient  feel that his/her medications are working for him/her?  yes  Has the patient been experiencing any side effects to the medications prescribed?  no  Does the patient measure his/her own blood pressure or blood glucose at home?  yes Measures blood pressure ocassionaly and blood glucose every morning.  Does the patient have any problems obtaining medications due to transportation or finances?   no  Understanding of regimen: excellent Understanding of indications: excellent Potential of compliance: excellent    Pharmacist comments: Ms. Mcroy is very compliant and has a great understanding of her medication regimen.   Joya San, PharmD Clinical Pharmacy Resident Pager # [phone] 12/07/2014 8:15 AM

## 2014-12-11 ENCOUNTER — Encounter (HOSPITAL_COMMUNITY): Payer: Medicare Other

## 2014-12-13 ENCOUNTER — Encounter (HOSPITAL_COMMUNITY): Payer: Medicare Other

## 2014-12-13 ENCOUNTER — Encounter (HOSPITAL_COMMUNITY)
Admission: RE | Admit: 2014-12-13 | Discharge: 2014-12-13 | Disposition: A | Discharge status: Home / Self Care | Payer: Medicare Other | Source: Ambulatory Visit | Attending: Internal Medicine | Admitting: Internal Medicine

## 2014-12-13 ENCOUNTER — Encounter (HOSPITAL_COMMUNITY): Payer: Self-pay

## 2014-12-13 DIAGNOSIS — Z79899 Other long term (current) drug therapy: Secondary | ICD-10-CM | POA: Diagnosis not present

## 2014-12-13 DIAGNOSIS — I5022 Chronic systolic (congestive) heart failure: Secondary | ICD-10-CM | POA: Diagnosis not present

## 2014-12-13 LAB — GLUCOSE, CAPILLARY
GLUCOSE-CAPILLARY: 160 mg/dL — AB (ref 65–99)
GLUCOSE-CAPILLARY: 110 mg/dL — AB (ref 65–99)

## 2014-12-13 NOTE — Progress Notes (Signed)
Pt started cardiac rehab today.  Pt tolerated light exercise without difficulty. VSS, telemetry-sinus rhythm, asymptomatic.  Medication list reconciled.  Pt verbalized compliance with medications and denies barriers to compliance. PSYCHOSOCIAL ASSESSMENT:  PHQ-0. Pt exhibits positive coping skills, hopeful outlook with supportive family. No psychosocial needs identified at this time, no psychosocial interventions necessary.    Pt enjoys watching TV, movies and going out to eat.   Pt cardiac rehab  goal is  to decrease hip and buttock pain with exercise and develop exercise program.  Pt encouraged to participate in home exercise and lifestyle modification classes  to increase ability to achieve these goals.   Pt oriented to exercise equipment and routine.  Understanding verbalized.

## 2014-12-15 ENCOUNTER — Encounter (HOSPITAL_COMMUNITY)
Admission: RE | Admit: 2014-12-15 | Discharge: 2014-12-15 | Disposition: A | Discharge status: Home / Self Care | Payer: Medicare Other | Source: Ambulatory Visit | Attending: Internal Medicine | Admitting: Internal Medicine

## 2014-12-15 ENCOUNTER — Encounter (HOSPITAL_COMMUNITY): Payer: Medicare Other

## 2014-12-15 DIAGNOSIS — I5022 Chronic systolic (congestive) heart failure: Secondary | ICD-10-CM | POA: Diagnosis not present

## 2014-12-15 DIAGNOSIS — Z79899 Other long term (current) drug therapy: Secondary | ICD-10-CM | POA: Diagnosis not present

## 2014-12-15 LAB — GLUCOSE, CAPILLARY
Glucose-Capillary: 127 mg/dL — ABNORMAL HIGH (ref 65–99)
Glucose-Capillary: 131 mg/dL — ABNORMAL HIGH (ref 65–99)

## 2014-12-18 ENCOUNTER — Encounter (HOSPITAL_COMMUNITY): Payer: Medicare Other

## 2014-12-18 ENCOUNTER — Encounter (HOSPITAL_COMMUNITY)
Admission: RE | Admit: 2014-12-18 | Discharge: 2014-12-18 | Disposition: A | Discharge status: Home / Self Care | Payer: Medicare Other | Source: Ambulatory Visit | Attending: Internal Medicine | Admitting: Internal Medicine

## 2014-12-18 DIAGNOSIS — I5022 Chronic systolic (congestive) heart failure: Secondary | ICD-10-CM | POA: Diagnosis not present

## 2014-12-18 DIAGNOSIS — Z79899 Other long term (current) drug therapy: Secondary | ICD-10-CM | POA: Diagnosis not present

## 2014-12-18 LAB — GLUCOSE, CAPILLARY
GLUCOSE-CAPILLARY: 118 mg/dL — AB (ref 65–99)
GLUCOSE-CAPILLARY: 123 mg/dL — AB (ref 65–99)

## 2014-12-20 ENCOUNTER — Encounter (HOSPITAL_COMMUNITY)
Admission: RE | Admit: 2014-12-20 | Discharge: 2014-12-20 | Disposition: A | Discharge status: Home / Self Care | Payer: Medicare Other | Source: Ambulatory Visit | Attending: Internal Medicine | Admitting: Internal Medicine

## 2014-12-20 ENCOUNTER — Encounter (HOSPITAL_COMMUNITY): Payer: Medicare Other

## 2014-12-20 DIAGNOSIS — Z79899 Other long term (current) drug therapy: Secondary | ICD-10-CM | POA: Diagnosis not present

## 2014-12-20 DIAGNOSIS — I5022 Chronic systolic (congestive) heart failure: Secondary | ICD-10-CM | POA: Diagnosis not present

## 2014-12-20 LAB — GLUCOSE, CAPILLARY
GLUCOSE-CAPILLARY: 110 mg/dL — AB (ref 65–99)
GLUCOSE-CAPILLARY: 93 mg/dL (ref 65–99)

## 2014-12-21 ENCOUNTER — Encounter (HOSPITAL_COMMUNITY): Payer: Self-pay | Admitting: Internal Medicine

## 2014-12-21 ENCOUNTER — Ambulatory Visit (HOSPITAL_COMMUNITY)
Admission: RE | Admit: 2014-12-21 | Discharge: 2014-12-21 | Disposition: A | Discharge status: Home / Self Care | Payer: Medicare Other | Source: Ambulatory Visit | Attending: Internal Medicine | Admitting: Internal Medicine

## 2014-12-21 VITALS — BP 100/58 | HR 66 | Wt 196.0 lb

## 2014-12-21 DIAGNOSIS — I255 Ischemic cardiomyopathy: Secondary | ICD-10-CM | POA: Diagnosis not present

## 2014-12-21 DIAGNOSIS — I2510 Atherosclerotic heart disease of native coronary artery without angina pectoris: Secondary | ICD-10-CM | POA: Insufficient documentation

## 2014-12-21 DIAGNOSIS — Z794 Long term (current) use of insulin: Secondary | ICD-10-CM | POA: Insufficient documentation

## 2014-12-21 DIAGNOSIS — Z95810 Presence of automatic (implantable) cardiac defibrillator: Secondary | ICD-10-CM | POA: Insufficient documentation

## 2014-12-21 DIAGNOSIS — Z951 Presence of aortocoronary bypass graft: Secondary | ICD-10-CM | POA: Insufficient documentation

## 2014-12-21 DIAGNOSIS — I739 Peripheral vascular disease, unspecified: Secondary | ICD-10-CM | POA: Insufficient documentation

## 2014-12-21 DIAGNOSIS — Z7982 Long term (current) use of aspirin: Secondary | ICD-10-CM | POA: Insufficient documentation

## 2014-12-21 DIAGNOSIS — Z6832 Body mass index (BMI) 32.0-32.9, adult: Secondary | ICD-10-CM | POA: Insufficient documentation

## 2014-12-21 DIAGNOSIS — Z823 Family history of stroke: Secondary | ICD-10-CM | POA: Insufficient documentation

## 2014-12-21 DIAGNOSIS — Z833 Family history of diabetes mellitus: Secondary | ICD-10-CM | POA: Insufficient documentation

## 2014-12-21 DIAGNOSIS — N183 Chronic kidney disease, stage 3 (moderate): Secondary | ICD-10-CM | POA: Insufficient documentation

## 2014-12-21 DIAGNOSIS — E669 Obesity, unspecified: Secondary | ICD-10-CM | POA: Insufficient documentation

## 2014-12-21 DIAGNOSIS — E1122 Type 2 diabetes mellitus with diabetic chronic kidney disease: Secondary | ICD-10-CM | POA: Insufficient documentation

## 2014-12-21 DIAGNOSIS — Z8249 Family history of ischemic heart disease and other diseases of the circulatory system: Secondary | ICD-10-CM | POA: Insufficient documentation

## 2014-12-21 DIAGNOSIS — I129 Hypertensive chronic kidney disease with stage 1 through stage 4 chronic kidney disease, or unspecified chronic kidney disease: Secondary | ICD-10-CM | POA: Insufficient documentation

## 2014-12-21 DIAGNOSIS — I5022 Chronic systolic (congestive) heart failure: Secondary | ICD-10-CM | POA: Insufficient documentation

## 2014-12-21 DIAGNOSIS — I340 Nonrheumatic mitral (valve) insufficiency: Secondary | ICD-10-CM | POA: Insufficient documentation

## 2014-12-21 DIAGNOSIS — I252 Old myocardial infarction: Secondary | ICD-10-CM | POA: Insufficient documentation

## 2014-12-21 DIAGNOSIS — Z79899 Other long term (current) drug therapy: Secondary | ICD-10-CM | POA: Insufficient documentation

## 2014-12-21 MED ORDER — CARVEDILOL 3.125 MG PO TABS: 3.1250 mg | ORAL_TABLET | Freq: Two times a day (BID) | ORAL | Status: DC

## 2014-12-21 NOTE — Progress Notes (Signed)
Patient ID: [name], female   DOB: 01-29-1956, 59 y.o.   MRN: [id number]   Advanced Heart Failure Clinic Note   Patient ID: [name], female   DOB: 03-31-1955, 59 y.o.   MRN: [id number] PCP: Dr. Trudee Kuster (Internal Medicine) Primary HF:  Dr. Haroldine Laws   HPI: 59 year old female with history of HTN, DM2, past polysubstance abuse (ETOH, tobacco, cocaine), CAD s/p CABG x5 with MV annuloplasty (2007), ischemic cardiomyopathy s/p Medtronic CRT-D, chronic systolic HF EF 41%, severe MR, CKD stage III and PAD. Blood type O+.  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 At time of cath LAD, LCX and RCA occluded. EF 45%. Had abrupt stent occlusion the next day and had to go back to the lab. 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   Amitted 8/2-10/07/13 for syncope s/p ICD shock. Concern that VT was possibly from ischemia and taken for [facility]. During cath patient had vagal response and BP dropped to 50s and co-ox was in the upper 40s. Placed on Levophed and transferred to floor. Started on Amiodarone.   On 11/25/13 underwent RHC for low output symptoms. Hemodynamic borderline. Swan left in and she was observed. Cardiac output dropped while in hospital so started on milrinone. Discharged home. Underwent CRT-D upgrade on 12/02/13  In 12/15, she was admitted with catheter infection. She was admitted and catheter was replaced.  She has completed antibiotic.    She has been titrated off milrinone.   She returns today for regular follow up. Continue to do pretty well. Going to cardiac rehab. Walks track and does bike. Denies exertional dyspnea unless she pushes too hard. C/o hip pain. Weight stable at home. Takes metolazone once a week for swelling. Last week she had tightness in left arm and over pacer. Denies exertional CP. BP low at times - SBP often 80-90s. Follows with Dr. Mearl Latin at [facility] but not listed for  transplant yet as she has been doing well.   Optivol: fluid well below threshold with stable impedance, No VT/AF noted, 3-4 hrs of activity daily.  05/19/2012  EF 15% 2/15 EF 15%, diffuse hypokinesis, restrictive diastolic function, s/p MV repair with moderate-severe MR.  4/15: EF 15%, severe MR, mod TR  CPX: 06/17/12 Peak VO2 14.8 (predicted peak VO2 68.5%), VE/VCO2 slope 43.4 OUES: 1.19, Peak RER 1.12 Vent threshold 11.3 (pred peak VO2 52.3%)  CPX (3/15): peak VO2 15.6 (predicted peak VO2 74.5%) VE/VO2 40.8, RER 1.2 CPX (6/16): peak VO2: 13.3 (68.6% predicted peak VO2) - corrects to 18.6 ml/kg/min for iBW   -VE/VCO2 slope: 33.7, OUES: 1.20, Peak RER: 1.12, VE/MVV: 48.4%,O2pulse: 11 (100% predicted O2pulse)  LHC (10/2013) 1) severe native CAD with all bypass grafts patent  11/01/13 RHC RA = 5  RV = 47/3/10  PA = 54/16 (30)  PCW = 14 v = 20  Fick cardiac output/index = 4.8/2.6  Thermo CO/CI = 3.7/2.0  PVR = 4.3 WU  FA sat = 95%  PA sat = 59%, 59%   Labs:  08/12/12 - on lipitor 40 mg daily Cholesterol 180 Triglyceride 195 HDL 29 LDL 112 K 4.5, creatinine 1.4 11/16/12 K 4.0 Labs 1.5 Pro BNP 181 02/14/13: K+ 4.6, Cr 1.33, Dig level 1.8, TSH 2.97 3/15: K 4.2, creatinine 1.07, HCT 32.4 06/2013: Dig level 0.3, pro-BNP 899, Cholesterol 140, TG 106, HDL 36, LDL 82, Cr 1.5, K+ 4.9 11/11/13:  K 4.1 Creatinine 1.8  01/31/14 Creatinine 1.48 K .5  02/07/14:  K 4.2 Creatinine 1.75 Magnesium 2.2 after sprionolactone 12.5 mg  was added  12/15: K 3.8, creatinine 1.32  04/04/2014: K 4.8 Creatinine 1.59 06/08/2014: K 3.3 Creatinine 1.47   FH: Mother deceased: CAD, DM2, HTN        Father deceased: stroke  SH: Works odds/end jobs for [employer]; disabled. Lives in [place] with 2 sons([name] and [name]).   ROS: All systems negative except as listed in HPI, PMH and Problem List.  Past Medical History  Diagnosis Date  . Coronary artery disease     a. s/p CABG x 5 with MV annuloplasty 2007   .  Diabetes mellitus   . Hypertension   . Chronic systolic heart failure ([place])     a. ICM b. ECHO (06/2013): EF 15%, severe MR (06/2013) c. RHC (10/2013): RA 5, RV 23/2/5, PA 25/6 (14), PCWP 12, Fick CO/CI: 3.2/1.7, PVR 0.7 WU, PA 45%, 47% and 55% (with levophed 5), vagal response during cath. d. On home milrinone.  . Polysubstance abuse     history of  (cocaine, tobacco and ETOH)  . V-tach ([place])   . Ischemic cardiomyopathy     a. s/p ICD. b. LHC (10/04/13): 1. Severe native CAD with all bypass grafts patent. c. CRT upgrade 12/2013.  . Implantable defibrillator   medtronic   . LBBB (left bundle branch block)   . AICD (automatic cardioverter/defibrillator) present   . PICC line infection     a. 01/2014 - PICC exchanged.  . CKD (chronic kidney disease), stage III   . PAD (peripheral artery disease) ([place])   . Lipoma     Current Outpatient Prescriptions  Medication Sig Dispense Refill  . ACCU-CHEK FASTCLIX LANCETS MISC Use to test blood glucose 2 times daily. Dx:E11.9 102 each 12  . acetaminophen (TYLENOL) 325 MG tablet Take 650 mg by mouth every 6 (six) hours as needed for mild pain.    [name] allopurinol (ZYLOPRIM) 100 MG tablet TAKE ONE TO TWO TABLETS BY MOUTH TWICE DAILY. TAKE $RemoveBef'200MG'[id number]$  (2 TABLETS) IN THE MORNING AND 100 MG (1 TABLET) IN THE EVENING. (Patient taking differently: TAKE $RemoveBeforeDEI'200MG'[id number]$  (2 TABLETS) IN THE MORNING AND 100 MG (1 TABLET) IN THE EVENING.) 90 tablet 0  . amiodarone (PACERONE) 200 MG tablet Take 1 tablet (200 mg total) by mouth daily. 30 tablet 0  . aspirin EC 81 MG tablet Take 81 mg by mouth every morning.     [name] atorvastatin (LIPITOR) 80 MG tablet Take 1 tablet (80 mg total) by mouth daily. 30 tablet 3  . Blood Glucose Monitoring Suppl (ACCU-CHEK NANO SMARTVIEW) W/DEVICE KIT Use to test blood glucose 2 times daily. Dx:E11.9 1 kit 0  . digoxin (LANOXIN) 0.125 MG tablet TAKE ONE-HALF TABLET BY MOUTH EVERY OTHER DAY 15 tablet 0  . furosemide (LASIX) 80 MG tablet Take 80 mg in AM and 40 mg in  PM 90 tablet 3  . glucose blood (ACCU-CHEK SMARTVIEW) test strip Use to test blood glucose 2 times daily. Dx:E11.9 100 each 12  . Insulin Glargine (LANTUS SOLOSTAR) 100 UNIT/ML Solostar Pen Inject 30 Units into the skin at bedtime. 15 mL 5  . insulin lispro (HUMALOG KWIKPEN) 100 UNIT/ML KiwkPen Inject 0.05 mLs (5 Units total) into the skin 3 (three) times daily before meals. Insulin requiring, ICD 10 code E11.9. Tests TID. 15 mL 11  . Insulin Pen Needle 31G X 8 MM MISC 1 each by Does  not apply route 3 (three) times daily. Inject insulin 3 times daily.  Insulin dependent DM E11.9. 100 each 11  . losartan (COZAAR) 25 MG tablet TAKE ONE-HALF TABLET BY MOUTH TWICE DAILY 30 tablet 4  . metolazone (ZAROXOLYN) 2.5 MG tablet Take 1 tablet (2.5 mg total) by mouth as needed. 15 tablet 3  . potassium chloride SA (K-DUR,KLOR-CON) 20 MEQ tablet Take 20 mEq by mouth as needed (take one tablet when you take metolazone).    [name] spironolactone (ALDACTONE) 25 MG tablet Take 25 mg by mouth daily.    [name] amoxicillin (AMOXIL) 500 MG tablet Take 2 tablets 1 hour prior to dental work and 2 tablets after procedure (Patient not taking: Reported on 12/21/2014) 4 tablet 0  . nitroGLYCERIN (NITROSTAT) 0.4 MG SL tablet Place 1 tablet (0.4 mg total) under the tongue every 5 (five) minutes as needed. For chest pain. (Patient not taking: Reported on 12/21/2014) 25 tablet 11  . [DISCONTINUED] sitaGLIPtin (JANUVIA) 25 MG tablet Take 1 tablet (25 mg total) by mouth daily. 30 tablet 1   No current facility-administered medications for this encounter.    Filed Vitals:   12/21/14 1022  BP: 100/58  Pulse: 66  Weight: 196 lb (88.905 kg)  SpO2: 98%    PHYSICAL EXAM: General:  Well appearing. No resp difficulty .  HEENT: normal Neck: supple. JVP flat  Carotids 2+ bilaterally; no bruits. No lymphadenopathy or thryomegaly appreciated. Cor: PMI normal. Regular rate & rhythm. 2/6 SEM LSB 2/6 MR, no s3   Lungs: clear Abdomen: obese,soft,  nontender, mildly distended. No hepatosplenomegaly. No bruits or masses. Good bowel sounds. Extremities: no cyanosis, clubbing, rash, trace edema. Large scar medial portion of right calf. Neuro: alert & orientedx3, cranial nerves grossly intact. Moves all 4 extremities w/o difficulty. Affect pleasant.  ASSESSMENT & PLAN:  1. Chronic systolic CHF: Ischemic cardiomyopathy s/p Medtronic CRT-D, EF 15% (4/15).  She has been seen in the transplant clinic at [facility].  She is blood type O so matching for transplant may be difficult.  She may need LVAD prior to getting transplant, but doing very well currently off milrinone, possibly due to CRT upgrade.  NYHA class II symptoms. Recent CPX reassuring.  - Had colonoscopy 4/7 with 12 polyps -->Plan for colonoscopy every 2 years.    - Continue current regimen. Will try to add on carvedilol 3.125 bid and see if her BP can tolerate.  - Reinforced the need and importance of daily weights, a low sodium diet, and fluid restriction (less than 2 L a day). Instructed to call the HF clinic if weight increases more than 3 lbs overnight or 5 lbs in a week.  2. CAD: No chest pain.  Continue ASA and statin.  3. Mitral regurgitation: s/p mitral valve annuloplasty with CABG in 2007. Moderate to severe MR on echo in 4/15. TEE (06/2013) mitral regurgitation appeared severe, anatomy of repaired valve does not look suitable for MV clipping and would be high risk for MV replacement.  4. CKD stage III: check labs today 5.  NSVT: denies any palpitations. Continue amiodarone 200 mg daily. Needs yearly eye exams, just had one. Check LFTs and TSH. 6. Obesity -BMI 32. Needs to keep weight down. If BMI > 35 will not be transplant candidate 7. PAD: Mild claudication.  Plan ABIs in 8/16.   Labs today. Follow up 3 months.   Bensimhon, Quillian Quince md 12/21/2014

## 2014-12-21 NOTE — Patient Instructions (Signed)
Start Carvedilol 3.125 mg Twice daily   Your physician recommends that you schedule a follow-up appointment in: 1 month

## 2014-12-22 ENCOUNTER — Encounter (HOSPITAL_COMMUNITY): Payer: Medicare Other

## 2014-12-22 ENCOUNTER — Telehealth (HOSPITAL_COMMUNITY): Payer: Self-pay | Admitting: Internal Medicine

## 2014-12-25 ENCOUNTER — Encounter (HOSPITAL_COMMUNITY): Payer: Medicare Other

## 2014-12-25 ENCOUNTER — Encounter (HOSPITAL_COMMUNITY)
Admission: RE | Admit: 2014-12-25 | Discharge: 2014-12-25 | Disposition: A | Discharge status: Home / Self Care | Payer: Medicare Other | Source: Ambulatory Visit | Attending: Internal Medicine | Admitting: Internal Medicine

## 2014-12-25 DIAGNOSIS — Z79899 Other long term (current) drug therapy: Secondary | ICD-10-CM | POA: Diagnosis not present

## 2014-12-25 DIAGNOSIS — I5022 Chronic systolic (congestive) heart failure: Secondary | ICD-10-CM | POA: Diagnosis not present

## 2014-12-25 LAB — GLUCOSE, CAPILLARY: Glucose-Capillary: 147 mg/dL — ABNORMAL HIGH (ref 65–99)

## 2014-12-26 ENCOUNTER — Other Ambulatory Visit (HOSPITAL_COMMUNITY): Payer: Self-pay | Admitting: Internal Medicine

## 2014-12-27 ENCOUNTER — Encounter (HOSPITAL_COMMUNITY): Payer: Medicare Other

## 2014-12-29 ENCOUNTER — Encounter (HOSPITAL_COMMUNITY): Payer: Medicare Other

## 2014-12-29 ENCOUNTER — Encounter (HOSPITAL_COMMUNITY)
Admission: RE | Admit: 2014-12-29 | Discharge: 2014-12-29 | Disposition: A | Discharge status: Home / Self Care | Payer: Medicare Other | Source: Ambulatory Visit | Attending: Internal Medicine | Admitting: Internal Medicine

## 2014-12-29 DIAGNOSIS — I5022 Chronic systolic (congestive) heart failure: Secondary | ICD-10-CM | POA: Diagnosis not present

## 2014-12-29 DIAGNOSIS — Z79899 Other long term (current) drug therapy: Secondary | ICD-10-CM | POA: Diagnosis not present

## 2014-12-29 NOTE — Progress Notes (Signed)
Pt arrived at cardiac rehab reporting change in medication regimen. Pt reports she discontinued coreg per Dr. Haroldine Laws due to weakness and fatigue.   Pt also reports she feels bloating and fluid retention today therefore she took metolazone per PRN instructions.  Pt able to exercise without difficulty, VSS with exercise.  Will continue to monitor.

## 2014-12-30 LAB — GLUCOSE, CAPILLARY
Glucose-Capillary: 170 mg/dL — ABNORMAL HIGH (ref 65–99)
Glucose-Capillary: 131 mg/dL — ABNORMAL HIGH (ref 65–99)

## 2015-01-01 ENCOUNTER — Encounter (HOSPITAL_COMMUNITY): Payer: Medicare Other

## 2015-01-01 ENCOUNTER — Encounter (HOSPITAL_COMMUNITY)
Admission: RE | Admit: 2015-01-01 | Discharge: 2015-01-01 | Disposition: A | Discharge status: Home / Self Care | Payer: Medicare Other | Source: Ambulatory Visit | Attending: Internal Medicine | Admitting: Internal Medicine

## 2015-01-01 DIAGNOSIS — Z79899 Other long term (current) drug therapy: Secondary | ICD-10-CM | POA: Diagnosis not present

## 2015-01-01 DIAGNOSIS — I5022 Chronic systolic (congestive) heart failure: Secondary | ICD-10-CM | POA: Diagnosis not present

## 2015-01-02 ENCOUNTER — Encounter: Payer: Self-pay | Admitting: *Deleted

## 2015-01-02 LAB — GLUCOSE, CAPILLARY
GLUCOSE-CAPILLARY: 138 mg/dL — AB (ref 65–99)
Glucose-Capillary: 109 mg/dL — ABNORMAL HIGH (ref 65–99)

## 2015-01-03 ENCOUNTER — Encounter (HOSPITAL_COMMUNITY): Payer: Medicare Other

## 2015-01-03 ENCOUNTER — Encounter (HOSPITAL_COMMUNITY)
Admission: RE | Admit: 2015-01-03 | Discharge: 2015-01-03 | Disposition: A | Discharge status: Home / Self Care | Payer: Medicare Other | Source: Ambulatory Visit | Attending: Internal Medicine | Admitting: Internal Medicine

## 2015-01-03 DIAGNOSIS — I5022 Chronic systolic (congestive) heart failure: Secondary | ICD-10-CM | POA: Diagnosis not present

## 2015-01-03 DIAGNOSIS — Z79899 Other long term (current) drug therapy: Secondary | ICD-10-CM | POA: Insufficient documentation

## 2015-01-03 LAB — GLUCOSE, CAPILLARY
GLUCOSE-CAPILLARY: 101 mg/dL — AB (ref 65–99)
GLUCOSE-CAPILLARY: 109 mg/dL — AB (ref 65–99)
Glucose-Capillary: 83 mg/dL (ref 65–99)

## 2015-01-03 NOTE — Progress Notes (Signed)
Reviewed home exercise with pt today.  Pt plans to walk for exercise.  Reviewed THR, pulse, RPE, sign and symptoms, and when to call 911 or MD.  Pt voiced understanding.    Duron Meister [employer]

## 2015-01-03 NOTE — Progress Notes (Signed)
Pt completed Quality of Life survey as a participant in Cardiac Rehab. pt scores are satisfactory in all categories. Pt has supportive family.  Pt currently works as part time caregiver, she is satisfied with this.  Pt is eager to participate in cardiac rehab to increase her strength and stamina.  Pt offered emotional support and reassurance.  Will continue to monitor.

## 2015-01-04 ENCOUNTER — Encounter: Payer: Self-pay | Admitting: Internal Medicine

## 2015-01-05 ENCOUNTER — Encounter (HOSPITAL_COMMUNITY)
Admission: RE | Admit: 2015-01-05 | Discharge: 2015-01-05 | Disposition: A | Discharge status: Home / Self Care | Payer: Medicare Other | Source: Ambulatory Visit | Attending: Internal Medicine | Admitting: Internal Medicine

## 2015-01-05 ENCOUNTER — Encounter (HOSPITAL_COMMUNITY): Payer: Medicare Other

## 2015-01-05 DIAGNOSIS — Z79899 Other long term (current) drug therapy: Secondary | ICD-10-CM | POA: Diagnosis not present

## 2015-01-05 DIAGNOSIS — I5022 Chronic systolic (congestive) heart failure: Secondary | ICD-10-CM | POA: Diagnosis not present

## 2015-01-05 LAB — GLUCOSE, CAPILLARY
GLUCOSE-CAPILLARY: 97 mg/dL (ref 65–99)
Glucose-Capillary: 112 mg/dL — ABNORMAL HIGH (ref 65–99)

## 2015-01-08 ENCOUNTER — Encounter (HOSPITAL_COMMUNITY): Payer: Medicare Other

## 2015-01-10 ENCOUNTER — Encounter (HOSPITAL_COMMUNITY): Payer: Medicare Other

## 2015-01-10 ENCOUNTER — Encounter (HOSPITAL_COMMUNITY)
Admission: RE | Admit: 2015-01-10 | Discharge: 2015-01-10 | Disposition: A | Discharge status: Home / Self Care | Payer: Medicare Other | Source: Ambulatory Visit | Attending: Internal Medicine | Admitting: Internal Medicine

## 2015-01-10 DIAGNOSIS — Z79899 Other long term (current) drug therapy: Secondary | ICD-10-CM | POA: Diagnosis not present

## 2015-01-10 DIAGNOSIS — I5022 Chronic systolic (congestive) heart failure: Secondary | ICD-10-CM | POA: Diagnosis not present

## 2015-01-10 LAB — GLUCOSE, CAPILLARY
GLUCOSE-CAPILLARY: 165 mg/dL — AB (ref 65–99)
Glucose-Capillary: 132 mg/dL — ABNORMAL HIGH (ref 65–99)

## 2015-01-11 ENCOUNTER — Other Ambulatory Visit (HOSPITAL_COMMUNITY): Payer: Self-pay | Admitting: Internal Medicine

## 2015-01-12 ENCOUNTER — Encounter (HOSPITAL_COMMUNITY)
Admission: RE | Admit: 2015-01-12 | Discharge: 2015-01-12 | Disposition: A | Discharge status: Home / Self Care | Payer: Medicare Other | Source: Ambulatory Visit | Attending: Internal Medicine | Admitting: Internal Medicine

## 2015-01-12 ENCOUNTER — Encounter (HOSPITAL_COMMUNITY): Payer: Medicare Other

## 2015-01-12 DIAGNOSIS — Z79899 Other long term (current) drug therapy: Secondary | ICD-10-CM | POA: Diagnosis not present

## 2015-01-12 DIAGNOSIS — I5022 Chronic systolic (congestive) heart failure: Secondary | ICD-10-CM | POA: Diagnosis not present

## 2015-01-12 LAB — GLUCOSE, CAPILLARY: Glucose-Capillary: 129 mg/dL — ABNORMAL HIGH (ref 65–99)

## 2015-01-15 ENCOUNTER — Encounter (HOSPITAL_COMMUNITY): Payer: Medicare Other

## 2015-01-15 ENCOUNTER — Encounter (HOSPITAL_COMMUNITY)
Admission: RE | Admit: 2015-01-15 | Discharge: 2015-01-15 | Disposition: A | Discharge status: Home / Self Care | Payer: Medicare Other | Source: Ambulatory Visit | Attending: Internal Medicine | Admitting: Internal Medicine

## 2015-01-15 DIAGNOSIS — I5022 Chronic systolic (congestive) heart failure: Secondary | ICD-10-CM | POA: Diagnosis not present

## 2015-01-15 DIAGNOSIS — Z79899 Other long term (current) drug therapy: Secondary | ICD-10-CM | POA: Diagnosis not present

## 2015-01-15 LAB — GLUCOSE, CAPILLARY
GLUCOSE-CAPILLARY: 101 mg/dL — AB (ref 65–99)
GLUCOSE-CAPILLARY: 110 mg/dL — AB (ref 65–99)

## 2015-01-16 IMAGING — CT CT CHEST W/O CM
1 of 4 series · 13 of 36 positions shown, 17 images · non-contrast
Comparison: CT of the abdomen and pelvis 11/07/2007. CT of the
chest 12/13/2005.

CLINICAL DATA: Evaluation prior to potential ventricular assist
device (VAD) placement.

EXAM:
CT CHEST, ABDOMEN AND PELVIS WITHOUT CONTRAST
TECHNIQUE: Multidetector CT imaging of the chest, abdomen and pelvis was
performed following the standard protocol without IV contrast.

[Series 201: cap with, idose (2) · axial · 0.74mm/px · z∈[+85,+660]mm · 13 of 131 slices shown, 17 images]
[im 8/131  mediastinal]
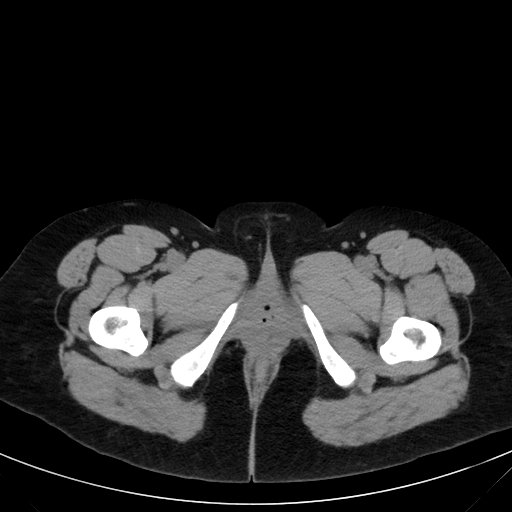
[im 8/131  lung]
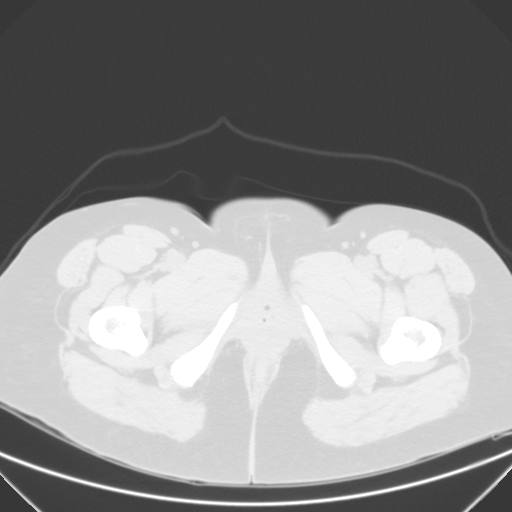
[im 15/131  lung]
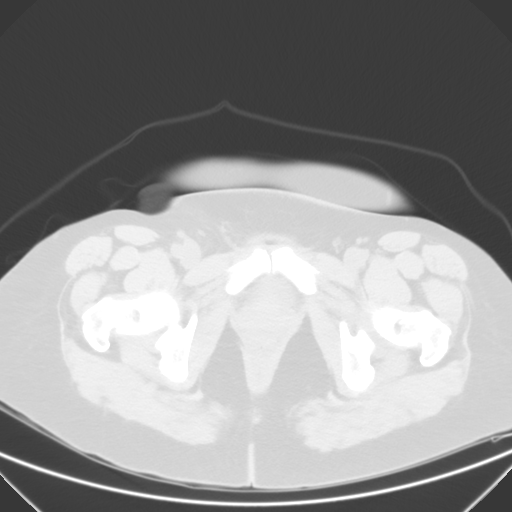
[im 29/131  lung]
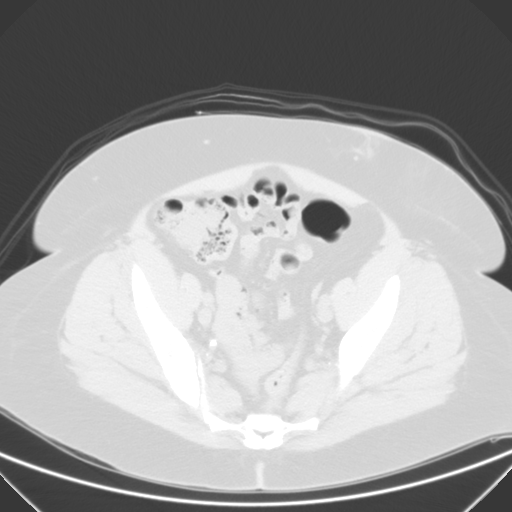
[im 37/131  lung]
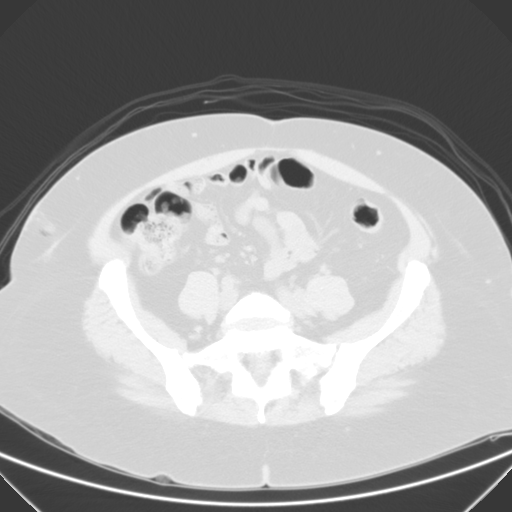
[im 44/131  mediastinal]
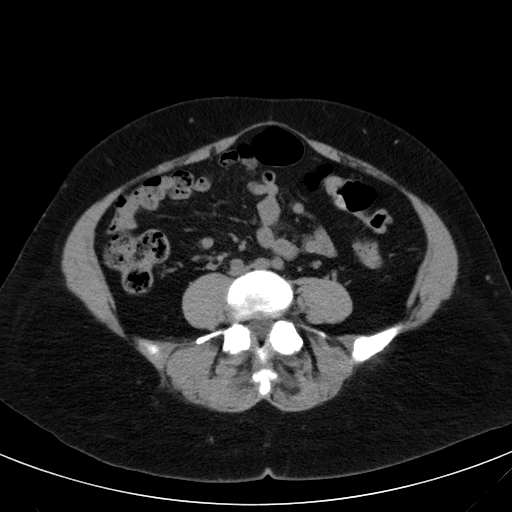
[im 44/131  lung]
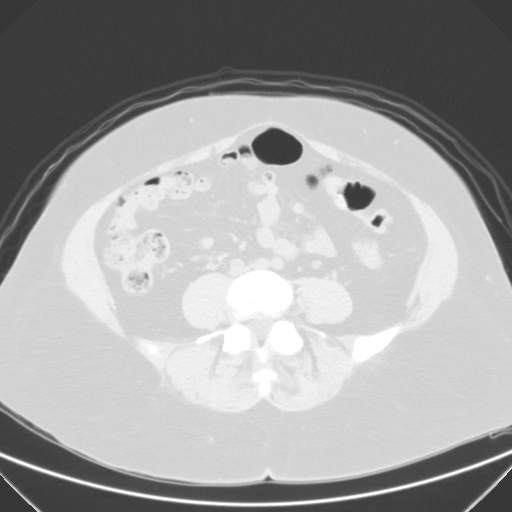
[im 58/131  lung]
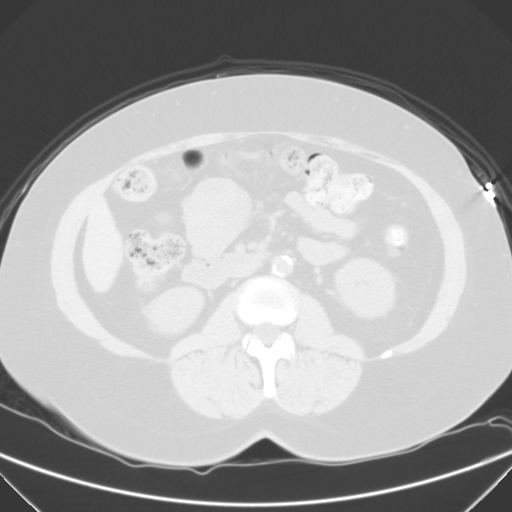
[im 66/131  lung]
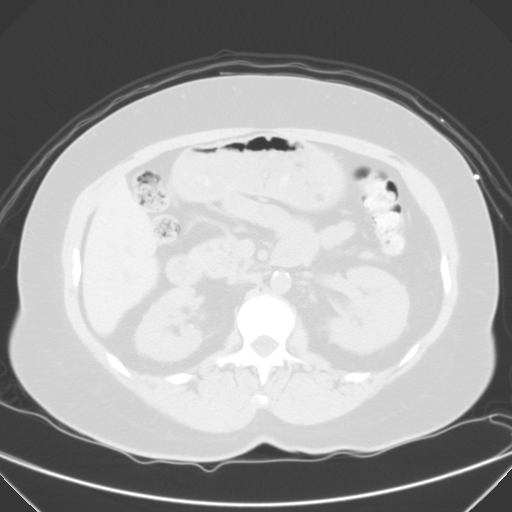
[im 73/131  lung]
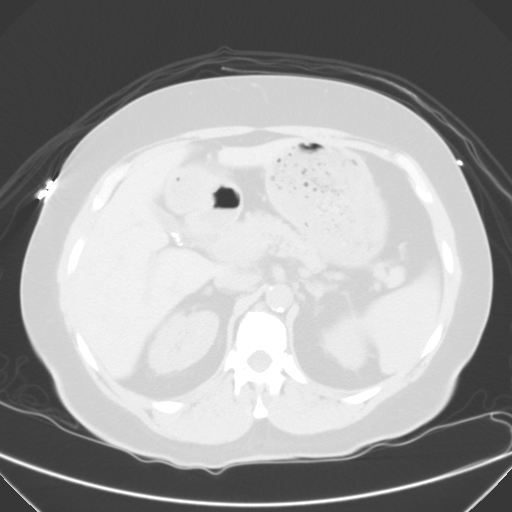
[im 87/131  mediastinal]
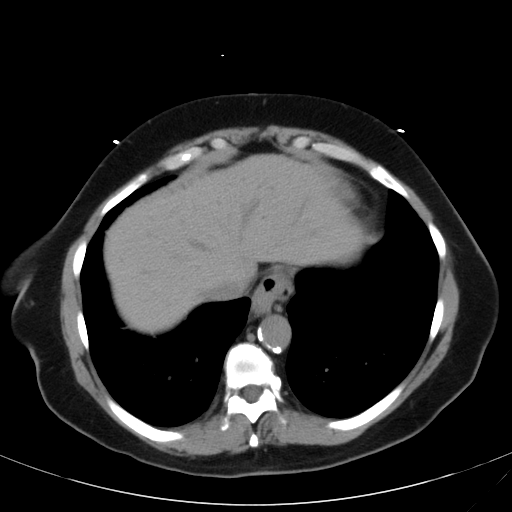
[im 87/131  lung]
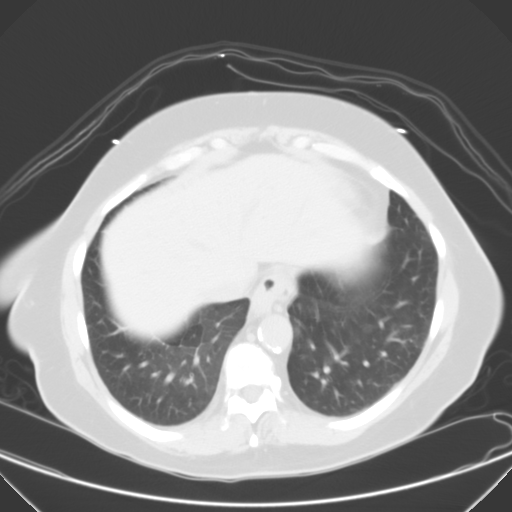
[im 94/131  lung]
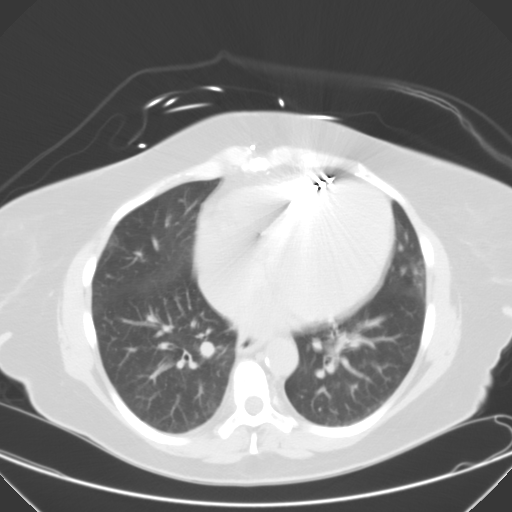
[im 102/131  lung]
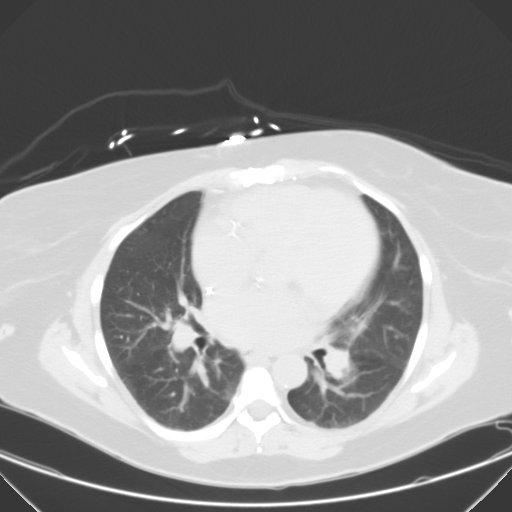
[im 116/131  lung]
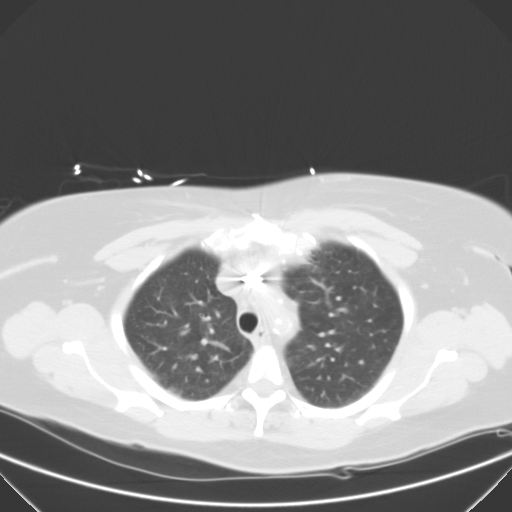
[im 123/131  mediastinal]
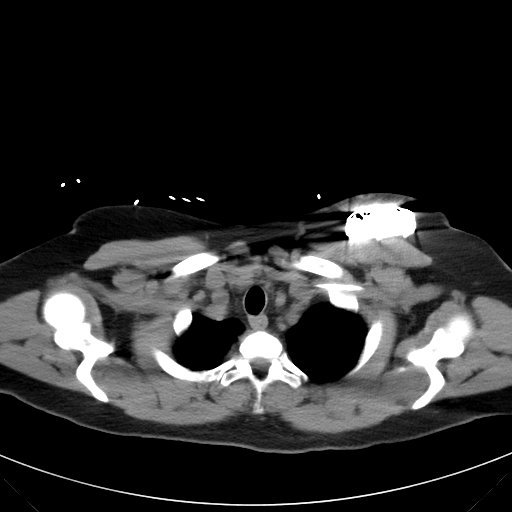
[im 123/131  lung]
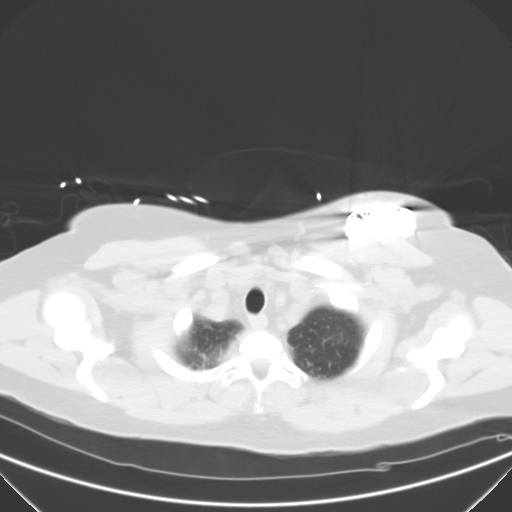

[13 of 36 positions shown; findings below may reference images not displayed]

FINDINGS: CT CHEST FINDINGS

Mediastinum: Heart size is mildly enlarged. There is no significant
pericardial fluid, thickening or pericardial calcification. There is
atherosclerosis of the thoracic aorta, the great vessels of the
mediastinum and the coronary arteries, including calcified
atherosclerotic plaque in the left main, left anterior descending,
left circumflex and right coronary arteries. Status post median
sternotomy for CABG, including [REDACTED] to the LAD. On image 18 of
series 201 the [REDACTED] graft comes in closest proximity to the
overlying sternotomy, approximately 1.6 cm deep and 2.1 cm to the
left of midline. There is also a mitral annuloplasty ring noted. The
apex of the left ventricle is deep to the anterior aspects of the
left fourth through fifth ribs. Left-sided pacemaker/AICD with lead
tip terminating in the right ventricular apex. No pathologically
enlarged mediastinal or hilar lymph nodes. Please note that accurate
exclusion of hilar adenopathy is limited on noncontrast CT scans.
Small hiatal hernia.

Lungs/Pleura: In the apex of the right upper lobe posteriorly there
is a 10 x 9 mm ground-glass attenuation nodule with irregular
margins (image 15 of series 202). Several other tiny nodules are
seen scattered throughout the lungs bilaterally, largest of which
measures only 3 mm in the subpleural aspect of the lingula (image 28
of series 202). No acute consolidative airspace disease. No pleural
effusions. There is a background of very mild ground-glass
attenuation and mild interlobular septal thickening, suggesting some
mild interstitial pulmonary edema.

Musculoskeletal: Median sternotomy wires. There are no aggressive
appearing lytic or blastic lesions noted in the visualized portions
of the skeleton.

CT ABDOMEN AND PELVIS FINDINGS

Abdomen/Pelvis: Small calcified gallstones are noted within the
gallbladder which is nearly completely contracted. No findings to
suggest acute cholecystitis at this time. The unenhanced appearance
of the liver, pancreas, spleen, bilateral adrenal glands in the left
kidney is unremarkable. There is a 2 mm calcification in the
interpolar region of the right kidney which may represent a vascular
calcification or a tiny nonobstructive calculus. Extensive
atherosclerosis throughout the abdominal and pelvic vasculature,
without evidence of aneurysm. No significant volume of ascites. No
pneumoperitoneum. No pathologic distention of small bowel. Normal
appendix. Uterus and ovaries are atrophic. Urinary bladder is normal
in appearance. No definite lymphadenopathy identified in the abdomen
or pelvis on today's non contrast CT examination.

Musculoskeletal: Multiple areas of fluid attenuation and tiny
locules of gas in the subcutaneous fat of the lower abdomen,
presumably iatrogenic. There are no aggressive appearing lytic or
blastic lesions noted in the visualized portions of the skeleton.
IMPRESSION: 1. Cardiomegaly with evidence of mild interstitial pulmonary edema,
suggesting mild congestive heart failure. Findings and measurements
pertinent to potential VAD placement, as above.
2. 10 x 9 mm ground-glass attenuation nodule with irregular margins
in the apex of the right upper lobe. This is nonspecific, and could
certainly be infectious or inflammatory, or could be related to
underlying edema. However, attention on followup studies is
recommended. Additionally, there are several other tiny pulmonary
nodules. Initial follow-up by chest CT without contrast is
recommended in 3 months to confirm persistence. This recommendation
follows the consensus statement: Recommendations for the Management
of Subsolid Pulmonary Nodules Detected at CT: A Statement from the
3. Cholelithiasis without evidence to suggest acute cholecystitis at
this time.
4. 2 mm calcification in the interpolar region of the right kidney
may represent a vascular calcification or a tiny nonobstructive
calculus.
5. Additional findings, as above.

## 2015-01-17 ENCOUNTER — Other Ambulatory Visit (HOSPITAL_COMMUNITY): Payer: Self-pay | Admitting: Internal Medicine

## 2015-01-17 ENCOUNTER — Encounter (HOSPITAL_COMMUNITY): Payer: Medicare Other

## 2015-01-17 ENCOUNTER — Ambulatory Visit (INDEPENDENT_AMBULATORY_CARE_PROVIDER_SITE_OTHER): Payer: Medicare Other | Admitting: Internal Medicine

## 2015-01-17 ENCOUNTER — Encounter: Payer: Self-pay | Admitting: Internal Medicine

## 2015-01-17 ENCOUNTER — Encounter (HOSPITAL_COMMUNITY)
Admission: RE | Admit: 2015-01-17 | Discharge: 2015-01-17 | Disposition: A | Discharge status: Home / Self Care | Payer: Medicare Other | Source: Ambulatory Visit | Attending: Internal Medicine | Admitting: Internal Medicine

## 2015-01-17 VITALS — BP 110/47 | HR 59 | Temp 97.8°F | Wt 202.6 lb

## 2015-01-17 DIAGNOSIS — M25551 Pain in right hip: Secondary | ICD-10-CM | POA: Diagnosis not present

## 2015-01-17 DIAGNOSIS — I5022 Chronic systolic (congestive) heart failure: Secondary | ICD-10-CM | POA: Diagnosis not present

## 2015-01-17 DIAGNOSIS — M25552 Pain in left hip: Secondary | ICD-10-CM

## 2015-01-17 DIAGNOSIS — E118 Type 2 diabetes mellitus with unspecified complications: Secondary | ICD-10-CM

## 2015-01-17 DIAGNOSIS — Z23 Encounter for immunization: Secondary | ICD-10-CM | POA: Diagnosis present

## 2015-01-17 DIAGNOSIS — Z0000 Encounter for general adult medical examination without abnormal findings: Secondary | ICD-10-CM | POA: Diagnosis not present

## 2015-01-17 DIAGNOSIS — Z79899 Other long term (current) drug therapy: Secondary | ICD-10-CM | POA: Diagnosis not present

## 2015-01-17 DIAGNOSIS — I255 Ischemic cardiomyopathy: Secondary | ICD-10-CM | POA: Diagnosis not present

## 2015-01-17 LAB — GLUCOSE, CAPILLARY
GLUCOSE-CAPILLARY: 119 mg/dL — AB (ref 65–99)
GLUCOSE-CAPILLARY: 75 mg/dL (ref 65–99)
Glucose-Capillary: 100 mg/dL — ABNORMAL HIGH (ref 65–99)
Glucose-Capillary: 112 mg/dL — ABNORMAL HIGH (ref 65–99)

## 2015-01-17 LAB — POCT GLYCOSYLATED HEMOGLOBIN (HGB A1C): HEMOGLOBIN A1C: 7.1

## 2015-01-17 NOTE — Patient Instructions (Signed)
You may continue to use Tylenol for your hip pain. Another option is a topical medicine called Voltaren Gel that may provide some relief. This is similar to medicine like Ibuprofen, but is used directly on the skin.  You can ask your heart doctor if Voltaren Gel is okay to use, if so, we can send a prescription to your pharmacy.  Your Hgb A1c is stable at 7.1. Good job! Please try to avoid fast food, junk food, and sweets to help continue to bring your A1C down.

## 2015-01-17 NOTE — Progress Notes (Signed)
Patient ID: [name], female   DOB: Jan 15, 1956, 59 y.o.   MRN: [id number]   Subjective:   Patient ID: [name] female   DOB: [birth year] 59 y.o.   MRN: [id number]  HPI: [name] is a 59 y.o. female with PMH as listed below who presents for management of her diabetes. Please see problem list for status of chronic medical conditions.   Past Medical History  Diagnosis Date  . Coronary artery disease     a. s/p CABG x 5 with MV annuloplasty 2007   . Diabetes mellitus   . Hypertension   . Chronic systolic heart failure ([place])     a. ICM b. ECHO (06/2013): EF 15%, severe MR (06/2013) c. RHC (10/2013): RA 5, RV 23/2/5, PA 25/6 (14), PCWP 12, Fick CO/CI: 3.2/1.7, PVR 0.7 WU, PA 45%, 47% and 55% (with levophed 5), vagal response during cath. d. On home milrinone.  . Polysubstance abuse     history of  (cocaine, tobacco and ETOH)  . V-tach ([place])   . Ischemic cardiomyopathy     a. s/p ICD. b. LHC (10/04/13): 1. Severe native CAD with all bypass grafts patent. c. CRT upgrade 12/2013.  . Implantable defibrillator   medtronic   . LBBB (left bundle branch block)   . AICD (automatic cardioverter/defibrillator) present   . PICC line infection     a. 01/2014 - PICC exchanged.  . CKD (chronic kidney disease), stage III   . PAD (peripheral artery disease) ([place])   . Lipoma    Current Outpatient Prescriptions  Medication Sig Dispense Refill  . ACCU-CHEK FASTCLIX LANCETS MISC Use to test blood glucose 2 times daily. Dx:E11.9 102 each 12  . acetaminophen (TYLENOL) 325 MG tablet Take 650 mg by mouth every 6 (six) hours as needed for mild pain.    [name] allopurinol (ZYLOPRIM) 100 MG tablet TAKE ONE TO TWO TABLETS BY MOUTH TWICE DAILY TAKE  200  MG  (2  TABLETS)  IN  THE  MORNING  AND  100  MG  (1  TABLET)  IN  THE  EVENING 90 tablet 0  . amiodarone (PACERONE) 200 MG tablet Take 1 tablet (200 mg total) by mouth daily. 30 tablet 0  . amoxicillin (AMOXIL) 500 MG tablet Take 2 tablets 1 hour  prior to dental work and 2 tablets after procedure (Patient not taking: Reported on 12/21/2014) 4 tablet 0  . aspirin EC 81 MG tablet Take 81 mg by mouth every morning.     [name] atorvastatin (LIPITOR) 80 MG tablet Take 1 tablet (80 mg total) by mouth daily. 30 tablet 3  . Blood Glucose Monitoring Suppl (ACCU-CHEK NANO SMARTVIEW) W/DEVICE KIT Use to test blood glucose 2 times daily. Dx:E11.9 1 kit 0  . carvedilol (COREG) 3.125 MG tablet Take 1 tablet (3.125 mg total) by mouth 2 (two) times daily. 60 tablet 3  . digoxin (LANOXIN) 0.125 MG tablet TAKE ONE-HALF TABLET BY MOUTH EVERY OTHER DAY 15 tablet 0  . digoxin (LANOXIN) 0.125 MG tablet TAKE ONE-HALF TABLET BY MOUTH EVERY OTHER DAY 15 tablet 0  . furosemide (LASIX) 80 MG tablet Take 80 mg in AM and 40 mg in PM 90 tablet 3  . glucose blood (ACCU-CHEK SMARTVIEW) test strip Use to test blood glucose 2 times daily. Dx:E11.9 100 each 12  . Insulin Glargine (LANTUS SOLOSTAR) 100 UNIT/ML Solostar Pen Inject 30 Units into the skin at bedtime. 15 mL 5  . Insulin Pen Needle 31G  X 8 MM MISC 1 each by Does not apply route 3 (three) times daily. Inject insulin 3 times daily.  Insulin dependent DM E11.9. 100 each 11  . losartan (COZAAR) 25 MG tablet TAKE ONE-HALF TABLET BY MOUTH TWICE DAILY 30 tablet 4  . metolazone (ZAROXOLYN) 2.5 MG tablet Take 1 tablet (2.5 mg total) by mouth as needed. 15 tablet 3  . nitroGLYCERIN (NITROSTAT) 0.4 MG SL tablet Place 1 tablet (0.4 mg total) under the tongue every 5 (five) minutes as needed. For chest pain. (Patient not taking: Reported on 12/21/2014) 25 tablet 11  . potassium chloride SA (K-DUR,KLOR-CON) 20 MEQ tablet Take 20 mEq by mouth as needed (take one tablet when you take metolazone).    [name] spironolactone (ALDACTONE) 25 MG tablet Take 25 mg by mouth daily.    . [DISCONTINUED] sitaGLIPtin (JANUVIA) 25 MG tablet Take 1 tablet (25 mg total) by mouth daily. 30 tablet 1   No current facility-administered medications for this  visit.   Family History  Problem Relation Age of Onset  . Heart attack Mother     Died age 48  . Diabetes Maternal Grandmother   . Heart attack Cousin   . Heart attack Maternal Uncle    Social History   Social History  . Marital Status: Widowed    Spouse Name: N/A  . Number of Children: N/A  . Years of Education: N/A   Occupational History  . disable    Social History Main Topics  . Smoking status: Former Smoker    Types: Cigarettes    Quit date: 03/03/2011  . Smokeless tobacco: None  . Alcohol Use: No  . Drug Use: No     Comment: Has history of cocaine use, denies recent  . Sexual Activity: Not Currently   Other Topics Concern  . None   Social History Narrative   Review of Systems: Review of Systems  Respiratory: Negative for cough and shortness of breath.   Cardiovascular: Negative for chest pain, palpitations and leg swelling.  Gastrointestinal: Negative for nausea, vomiting, abdominal pain, diarrhea, constipation, blood in stool and melena.  Genitourinary: Negative for dysuria and hematuria.  Musculoskeletal: Positive for joint pain. Negative for back pain, falls and neck pain.  Skin: Negative for rash.  Neurological: Negative for dizziness, tingling and headaches.    Objective:  Physical Exam: Filed Vitals:   01/17/15 1527  BP: 110/47  Pulse: 59  Temp: 97.8 F (36.6 C)  TempSrc: Oral  Weight: 202 lb 9.6 oz (91.899 kg)  SpO2: 94%   Physical Exam  Constitutional: She is oriented to person, place, and time. She appears well-developed and well-nourished. No distress.  HENT:  Head: Normocephalic and atraumatic.  Cardiovascular: Normal rate and regular rhythm.   Murmur heard.  Systolic murmur is present  Pulmonary/Chest: Effort normal and breath sounds normal. No respiratory distress. She has no wheezes. She has no rales. She exhibits no tenderness.  Abdominal: Soft. There is no tenderness.  Musculoskeletal: Normal range of motion. She exhibits no  edema or tenderness.       Right hip: She exhibits normal range of motion and no tenderness.       Left hip: She exhibits normal range of motion and no tenderness.  Neurological: She is alert and oriented to person, place, and time.  Skin: Skin is warm.  Psychiatric: She has a normal mood and affect.    Assessment & Plan:  Please see problem based charting.

## 2015-01-17 NOTE — Progress Notes (Signed)
No psychosocial needs identfied, no interventions necessary.  Will continue to monitor.  Pt is exercising at home on her own. Pt feels she has increased energy since starting cardiac rehab.

## 2015-01-18 DIAGNOSIS — Z0000 Encounter for general adult medical examination without abnormal findings: Secondary | ICD-10-CM | POA: Insufficient documentation

## 2015-01-18 NOTE — Assessment & Plan Note (Signed)
Hgb A1C is 7.1, same as 3 months ago. She takes Lantus 30 units qhs. Chart review shows that she is prescribed Humalog 5 units TIDAC if her CBG is >150. She says she has not taken her Humalog in over a year as her blood glucose has not been >150. She has been on Metformin in the past, but she is not sure why it was stopped. She reports tolerating it well. Patient denies having any low or high blood sugars recently. Her diabetes is well controlled now. -Continue Lantus 30 units qhs -Discontinue Humalog -Consider Metformin in the future with subsequent titrating down of insulin

## 2015-01-18 NOTE — Assessment & Plan Note (Signed)
Patient reports continued bilateral hip pain that occurs in the morning when she is getting out of bed. It is not constant, but on and off. She says the pain improves shortly after she starts moving around. She takes Tylenol 500 mg once daily which relieves her pain. Prior X-Ray of the left hip shows degenerative changes. Her symptoms are most consistent with osteoarthritis. As her pain is controlled with Tylenol now and not limiting her daily activities, will not add additional medication. We discussed the possibility of trying Voltaren Gel, but she would like to discuss with her cardiologist first. -Continue Tylenol 500 mg prn for pain -Consider Voltaren Gel if appropriate

## 2015-01-18 NOTE — Assessment & Plan Note (Addendum)
Flu shot given this visit. 

## 2015-01-19 ENCOUNTER — Encounter (HOSPITAL_COMMUNITY)
Admission: RE | Admit: 2015-01-19 | Discharge: 2015-01-19 | Disposition: A | Discharge status: Home / Self Care | Payer: Medicare Other | Source: Ambulatory Visit | Attending: Internal Medicine | Admitting: Internal Medicine

## 2015-01-19 ENCOUNTER — Encounter (HOSPITAL_COMMUNITY): Payer: Medicare Other

## 2015-01-19 DIAGNOSIS — Z79899 Other long term (current) drug therapy: Secondary | ICD-10-CM | POA: Diagnosis not present

## 2015-01-19 DIAGNOSIS — I5022 Chronic systolic (congestive) heart failure: Secondary | ICD-10-CM | POA: Diagnosis not present

## 2015-01-19 NOTE — Progress Notes (Signed)
Internal Medicine Clinic Attending  I saw and evaluated the patient.  I personally confirmed the key portions of the history and exam documented by Dr. Patel,Vishal and I reviewed pertinent patient test results.  The assessment, diagnosis, and plan were formulated together and I agree with the documentation in the resident's note.  

## 2015-01-20 LAB — GLUCOSE, CAPILLARY
GLUCOSE-CAPILLARY: 103 mg/dL — AB (ref 65–99)
GLUCOSE-CAPILLARY: 96 mg/dL (ref 65–99)

## 2015-01-22 ENCOUNTER — Encounter (HOSPITAL_COMMUNITY): Payer: Self-pay | Admitting: Internal Medicine

## 2015-01-22 ENCOUNTER — Ambulatory Visit (HOSPITAL_COMMUNITY)
Admission: RE | Admit: 2015-01-22 | Discharge: 2015-01-22 | Disposition: A | Discharge status: Home / Self Care | Payer: Medicare Other | Source: Ambulatory Visit | Attending: Internal Medicine | Admitting: Internal Medicine

## 2015-01-22 ENCOUNTER — Encounter (HOSPITAL_COMMUNITY)
Admission: RE | Admit: 2015-01-22 | Discharge: 2015-01-22 | Disposition: A | Discharge status: Home / Self Care | Payer: Medicare Other | Source: Ambulatory Visit | Attending: Internal Medicine | Admitting: Internal Medicine

## 2015-01-22 ENCOUNTER — Encounter (HOSPITAL_COMMUNITY): Payer: Medicare Other

## 2015-01-22 VITALS — BP 106/62 | HR 63 | Wt 197.0 lb

## 2015-01-22 DIAGNOSIS — Z87891 Personal history of nicotine dependence: Secondary | ICD-10-CM | POA: Insufficient documentation

## 2015-01-22 DIAGNOSIS — I255 Ischemic cardiomyopathy: Secondary | ICD-10-CM | POA: Insufficient documentation

## 2015-01-22 DIAGNOSIS — Z951 Presence of aortocoronary bypass graft: Secondary | ICD-10-CM | POA: Diagnosis not present

## 2015-01-22 DIAGNOSIS — I5022 Chronic systolic (congestive) heart failure: Secondary | ICD-10-CM | POA: Diagnosis not present

## 2015-01-22 DIAGNOSIS — Z6832 Body mass index (BMI) 32.0-32.9, adult: Secondary | ICD-10-CM | POA: Insufficient documentation

## 2015-01-22 DIAGNOSIS — Z95810 Presence of automatic (implantable) cardiac defibrillator: Secondary | ICD-10-CM | POA: Insufficient documentation

## 2015-01-22 DIAGNOSIS — F1021 Alcohol dependence, in remission: Secondary | ICD-10-CM | POA: Diagnosis not present

## 2015-01-22 DIAGNOSIS — I252 Old myocardial infarction: Secondary | ICD-10-CM | POA: Diagnosis not present

## 2015-01-22 DIAGNOSIS — E1122 Type 2 diabetes mellitus with diabetic chronic kidney disease: Secondary | ICD-10-CM | POA: Diagnosis not present

## 2015-01-22 DIAGNOSIS — E669 Obesity, unspecified: Secondary | ICD-10-CM | POA: Diagnosis not present

## 2015-01-22 DIAGNOSIS — Z794 Long term (current) use of insulin: Secondary | ICD-10-CM | POA: Diagnosis not present

## 2015-01-22 DIAGNOSIS — I447 Left bundle-branch block, unspecified: Secondary | ICD-10-CM | POA: Diagnosis not present

## 2015-01-22 DIAGNOSIS — Z79899 Other long term (current) drug therapy: Secondary | ICD-10-CM | POA: Diagnosis not present

## 2015-01-22 DIAGNOSIS — I472 Ventricular tachycardia: Secondary | ICD-10-CM | POA: Insufficient documentation

## 2015-01-22 DIAGNOSIS — Z7982 Long term (current) use of aspirin: Secondary | ICD-10-CM | POA: Diagnosis not present

## 2015-01-22 DIAGNOSIS — F1421 Cocaine dependence, in remission: Secondary | ICD-10-CM | POA: Diagnosis not present

## 2015-01-22 DIAGNOSIS — N183 Chronic kidney disease, stage 3 (moderate): Secondary | ICD-10-CM | POA: Insufficient documentation

## 2015-01-22 DIAGNOSIS — Z87898 Personal history of other specified conditions: Secondary | ICD-10-CM | POA: Diagnosis not present

## 2015-01-22 DIAGNOSIS — I129 Hypertensive chronic kidney disease with stage 1 through stage 4 chronic kidney disease, or unspecified chronic kidney disease: Secondary | ICD-10-CM | POA: Insufficient documentation

## 2015-01-22 DIAGNOSIS — I739 Peripheral vascular disease, unspecified: Secondary | ICD-10-CM | POA: Diagnosis not present

## 2015-01-22 DIAGNOSIS — I340 Nonrheumatic mitral (valve) insufficiency: Secondary | ICD-10-CM | POA: Insufficient documentation

## 2015-01-22 DIAGNOSIS — I2510 Atherosclerotic heart disease of native coronary artery without angina pectoris: Secondary | ICD-10-CM | POA: Insufficient documentation

## 2015-01-22 LAB — COMPREHENSIVE METABOLIC PANEL
ALBUMIN: 4.3 g/dL (ref 3.5–5.0)
ALK PHOS: 83 U/L (ref 38–126)
ALT: 86 U/L — ABNORMAL HIGH (ref 14–54)
AST: 48 U/L — AB (ref 15–41)
Anion gap: 11 (ref 5–15)
BILIRUBIN TOTAL: 0.6 mg/dL (ref 0.3–1.2)
BUN: 47 mg/dL — AB (ref 6–20)
CALCIUM: 9.5 mg/dL (ref 8.9–10.3)
CO2: 26 mmol/L (ref 22–32)
Chloride: 99 mmol/L — ABNORMAL LOW (ref 101–111)
Creatinine, Ser: 1.81 mg/dL — ABNORMAL HIGH (ref 0.44–1.00)
GFR calc Af Amer: 35 mL/min — ABNORMAL LOW (ref >60)
GFR calc non Af Amer: 30 mL/min — ABNORMAL LOW (ref >60)
GLUCOSE: 129 mg/dL — AB (ref 65–99)
Potassium: 4.1 mmol/L (ref 3.5–5.1)
Sodium: 136 mmol/L (ref 135–145)
TOTAL PROTEIN: 7.9 g/dL (ref 6.5–8.1)

## 2015-01-22 LAB — GLUCOSE, CAPILLARY: Glucose-Capillary: 111 mg/dL — ABNORMAL HIGH (ref 65–99)

## 2015-01-22 MED ORDER — METOLAZONE 2.5 MG PO TABS: 2.5000 mg | ORAL_TABLET | ORAL | Status: DC

## 2015-01-22 NOTE — Progress Notes (Signed)
Advanced Heart Failure Clinic Note   Patient ID: [name], female   DOB: 1955/04/03, 59 y.o.   MRN: [id number] PCP: Dr. Trudee Kuster (Internal Medicine) Primary HF:  Dr. Haroldine Laws   HPI: 59 year old female with history of HTN, DM2, past polysubstance abuse (ETOH, tobacco, cocaine), CAD s/p CABG x5 with MV annuloplasty (2007), ischemic cardiomyopathy s/p Medtronic CRT-D, chronic systolic HF EF 87%, severe MR, CKD stage III and PAD. Blood type O+.  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 At time of cath LAD, LCX and RCA occluded. EF 45%. Had abrupt stent occlusion the next day and had to go back to the lab. 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   Amitted 8/2-10/07/13 for syncope s/p ICD shock. Concern that VT was possibly from ischemia and taken for [facility]. During cath patient had vagal response and BP dropped to 50s and co-ox was in the upper 40s. Placed on Levophed and transferred to floor. Started on Amiodarone.   On 11/25/13 underwent RHC for low output symptoms. Hemodynamic borderline. Swan left in and she was observed. Cardiac output dropped while in hospital so started on milrinone. Discharged home. Underwent CRT-D upgrade on 12/02/13  In 12/15, she was admitted with catheter infection. She was admitted and catheter was replaced.  She has completed antibiotic.    She has been titrated off milrinone.   She returns today for regular follow up. Has been doing well overall. Friday and Saturday felt really tired and weight had gone up 7 lbs overnight after a fish and shrimp dinner.  Took a metolazone and felt much better. Weight back down to 193 at home. Gets tired after exercise, especially after the bike in cardiac rehab, but doesn't note any SOB. SBPs now in 100-110s per cardiac rehab sheet. Does occasionally feel lightheaded in am after peeing all night. Sees Dr. Mearl Latin again in January at [facility]. Not  listed for transplant yet as she has been doing well.   Optivol: Fluid well below threshold now, but with recent upspike. ~4 hrs activity a day.   05/19/2012  EF 15% 2/15 EF 15%, diffuse hypokinesis, restrictive diastolic function, s/p MV repair with moderate-severe MR.  4/15: EF 15%, severe MR, mod TR  CPX: 06/17/12 Peak VO2 14.8 (predicted peak VO2 68.5%), VE/VCO2 slope 43.4 OUES: 1.19, Peak RER 1.12 Vent threshold 11.3 (pred peak VO2 52.3%)  CPX (3/15): peak VO2 15.6 (predicted peak VO2 74.5%) VE/VO2 40.8, RER 1.2 CPX (6/16): peak VO2: 13.3 (68.6% predicted peak VO2) - corrects to 18.6 ml/kg/min for iBW   -VE/VCO2 slope: 33.7, OUES: 1.20, Peak RER: 1.12, VE/MVV: 48.4%,O2pulse: 11 (100% predicted O2pulse)  LHC (10/2013) 1) severe native CAD with all bypass grafts patent  11/01/13 RHC RA = 5  RV = 47/3/10  PA = 54/16 (30)  PCW = 14 v = 20  Fick cardiac output/index = 4.8/2.6  Thermo CO/CI = 3.7/2.0  PVR = 4.3 WU  FA sat = 95%  PA sat = 59%, 59%   Labs:  08/12/12 - on lipitor 40 mg daily Cholesterol 180 Triglyceride 195 HDL 29 LDL 112 K 4.5, creatinine 1.4 11/16/12 K 4.0 Labs 1.5 Pro BNP 181 02/14/13: K+ 4.6, Cr 1.33, Dig level 1.8, TSH 2.97 3/15: K 4.2, creatinine 1.07, HCT 32.4 06/2013: Dig level 0.3, pro-BNP 899, Cholesterol 140, TG 106, HDL 36, LDL 82, Cr 1.5, K+ 4.9 11/11/13: K  4.1 Creatinine 1.8  01/31/14 Creatinine 1.48 K .5  02/07/14:  K 4.2 Creatinine 1.75 Magnesium 2.2 after sprionolactone 12.5 mg  was added  12/15: K 3.8, creatinine 1.32  04/04/2014: K 4.8 Creatinine 1.59 06/08/2014: K 3.3 Creatinine 1.47   FH: Mother deceased: CAD, DM2, HTN        Father deceased: stroke  SH: Works odds/end jobs for [employer]; disabled. Lives in [place] with 2 sons([name] and [name]).   ROS: All systems negative except as listed in HPI, PMH and Problem List.  Past Medical History  Diagnosis Date  . Coronary artery disease     a. s/p CABG x 5 with MV annuloplasty 2007   .  Diabetes mellitus   . Hypertension   . Chronic systolic heart failure ([place])     a. ICM b. ECHO (06/2013): EF 15%, severe MR (06/2013) c. RHC (10/2013): RA 5, RV 23/2/5, PA 25/6 (14), PCWP 12, Fick CO/CI: 3.2/1.7, PVR 0.7 WU, PA 45%, 47% and 55% (with levophed 5), vagal response during cath. d. On home milrinone.  . Polysubstance abuse     history of  (cocaine, tobacco and ETOH)  . V-tach ([place])   . Ischemic cardiomyopathy     a. s/p ICD. b. LHC (10/04/13): 1. Severe native CAD with all bypass grafts patent. c. CRT upgrade 12/2013.  . Implantable defibrillator   medtronic   . LBBB (left bundle branch block)   . AICD (automatic cardioverter/defibrillator) present   . PICC line infection     a. 01/2014 - PICC exchanged.  . CKD (chronic kidney disease), stage III   . PAD (peripheral artery disease) ([place])   . Lipoma     Current Outpatient Prescriptions  Medication Sig Dispense Refill  . ACCU-CHEK FASTCLIX LANCETS MISC Use to test blood glucose 2 times daily. Dx:E11.9 102 each 12  . acetaminophen (TYLENOL) 325 MG tablet Take 650 mg by mouth every 6 (six) hours as needed for mild pain.    [name] allopurinol (ZYLOPRIM) 100 MG tablet TAKE ONE TO TWO TABLETS BY MOUTH TWICE DAILY TAKE  200  MG  (2  TABLETS)  IN  THE  MORNING  AND  100  MG  (1  TABLET)  IN  THE  EVENING 90 tablet 0  . amiodarone (PACERONE) 200 MG tablet Take 1 tablet (200 mg total) by mouth daily. 30 tablet 0  . amoxicillin (AMOXIL) 500 MG tablet Take 2 tablets 1 hour prior to dental work and 2 tablets after procedure (Patient not taking: Reported on 12/21/2014) 4 tablet 0  . aspirin EC 81 MG tablet Take 81 mg by mouth every morning.     [name] atorvastatin (LIPITOR) 80 MG tablet Take 1 tablet (80 mg total) by mouth daily. 30 tablet 3  . Blood Glucose Monitoring Suppl (ACCU-CHEK NANO SMARTVIEW) W/DEVICE KIT Use to test blood glucose 2 times daily. Dx:E11.9 1 kit 0  . carvedilol (COREG) 3.125 MG tablet Take 1 tablet (3.125 mg total) by mouth 2 (two)  times daily. 60 tablet 3  . digoxin (LANOXIN) 0.125 MG tablet TAKE ONE-HALF TABLET BY MOUTH EVERY OTHER DAY 15 tablet 0  . digoxin (LANOXIN) 0.125 MG tablet TAKE ONE-HALF TABLET BY MOUTH EVERY OTHER DAY 15 tablet 0  . furosemide (LASIX) 80 MG tablet Take 80 mg in AM and 40 mg in PM 90 tablet 3  . glucose blood (ACCU-CHEK SMARTVIEW) test strip Use to test blood glucose 2 times daily. Dx:E11.9 100 each 12  . Insulin  Glargine (LANTUS SOLOSTAR) 100 UNIT/ML Solostar Pen Inject 30 Units into the skin at bedtime. 15 mL 5  . Insulin Pen Needle 31G X 8 MM MISC 1 each by Does not apply route 3 (three) times daily. Inject insulin 3 times daily.  Insulin dependent DM E11.9. 100 each 11  . losartan (COZAAR) 25 MG tablet TAKE ONE-HALF TABLET BY MOUTH TWICE DAILY 30 tablet 4  . metolazone (ZAROXOLYN) 2.5 MG tablet Take 1 tablet (2.5 mg total) by mouth as needed. 15 tablet 3  . nitroGLYCERIN (NITROSTAT) 0.4 MG SL tablet Place 1 tablet (0.4 mg total) under the tongue every 5 (five) minutes as needed. For chest pain. (Patient not taking: Reported on 12/21/2014) 25 tablet 11  . potassium chloride SA (K-DUR,KLOR-CON) 20 MEQ tablet Take 20 mEq by mouth as needed (take one tablet when you take metolazone).    [name] spironolactone (ALDACTONE) 25 MG tablet Take 25 mg by mouth daily.    . [DISCONTINUED] sitaGLIPtin (JANUVIA) 25 MG tablet Take 1 tablet (25 mg total) by mouth daily. 30 tablet 1   No current facility-administered medications for this encounter.    Filed Vitals:   01/22/15 1422  BP: 106/62  Pulse: 63  Weight: 197 lb (89.359 kg)  SpO2: 98%    PHYSICAL EXAM: General:  Well appearing. NAD HEENT: normal Neck: supple. JVP flat  Carotids 2+ bilaterally; no bruits. No thyromegaly appreciated. Cor: PMI normal. RRR. 2/6 SEM LSB 2/6 MR, no s3   Lungs: CTA bilaterally, normal effort Abdomen: obese, soft, NT, ND, no HSM. No bruits or masses. +BS Extremities: no cyanosis, clubbing, rash. Trace LE edema. Large scar  medial portion of right calf. Neuro: alert & orientedx3, cranial nerves grossly intact. Moves all 4 extremities w/o difficulty. Affect pleasant.  ASSESSMENT & PLAN:  1. Chronic systolic CHF: Ischemic cardiomyopathy s/p Medtronic CRT-D, EF 15% (4/15).  She has been seen in the transplant clinic at [facility].  She is blood type O so matching for transplant may be difficult.  She may need LVAD prior to getting transplant, but continues to do very well off milrinone, may be 2/2 CRT upgrade.  NYHA class II-early III symptoms. CPX reassuring.  - Had colonoscopy 06/08/14 with 12 polyps -->Plan for colonoscopy every 2 years.    - Continue current regimen.  - Continue carvedilol 3.125 BID. Can't titrate up with BPs in 100s and occasional lightheadedness. (already failed titration in 9/16) - Reinforced the need and importance of daily weights, a low sodium diet, and fluid restriction (less than 2 L a day). Instructed to call the HF clinic if weight increases more than 3 lbs overnight or 5 lbs in a week.  2. CAD: No chest pain.  Continue ASA and statin.  3. Mitral regurgitation: s/p mitral valve annuloplasty with CABG in 2007. Moderate to severe MR on echo in 4/15. TEE (06/2013) mitral regurgitation appeared severe, anatomy of repaired valve does not look suitable for MV clipping and would be high risk for MV replacement.  4. CKD stage III: Stable last check. Recheck today.  5.  NSVT: Continue amiodarone 200 mg daily. Needs yearly eye exams, had one earlier this year. TSH normal and LFTs minimally elevated. 6. Obesity -BMI 32. Needs to keep weight down. If BMI > 35 will not be transplant candidate  7. PAD: Mild claudication.  Right mid SFA stenosis per [place] 08/30/14. Medically management.   CMET today. Follow up 3 months.   Shirley Friar PA-C 01/22/2015  Patient seen  and examined with Oda Kilts, PA-C. We discussed all aspects of the encounter. I agree with the assessment and plan as stated above.    Stable NYHA II-III. Doing well with CR. Volume status looks ok. Will continue current regimen. Continues to follow with [facility] intermittently for transplant consideration.   Marian Meneely,MD 8:50 PM

## 2015-01-22 NOTE — Patient Instructions (Signed)
Labs today will call if abnormal   Follow up in 3 months with echo same day

## 2015-01-22 NOTE — Progress Notes (Signed)
Advanced Heart Failure Medication Review by a Pharmacist  Does the patient  feel that his/her medications are working for him/her?  yes  Has the patient been experiencing any side effects to the medications prescribed?  no  Does the patient measure his/her own blood pressure or blood glucose at home?  yes   Does the patient have any problems obtaining medications due to transportation or finances?   no  Understanding of regimen: good Understanding of indications: good Potential of compliance: good Patient understands to avoid NSAIDs. Patient understands to avoid decongestants.  Issues to address at subsequent visits: None   Pharmacist comments:  [name] is a pleasant 59 yo F presenting with a medication list but with good recall of her regimen including dosages. She reports good compliance with her medications and did not have any specific medication-related questions or concerns for me at this time.   [name]. [name], PharmD, BCPS, CPP Clinical Pharmacist Pager: [phone] Phone: [phone] 01/22/2015 3:17 PM      Time with patient: 8 minutes Preparation and documentation time: 2 minutes Total time: 10 minutes

## 2015-01-24 ENCOUNTER — Encounter (HOSPITAL_COMMUNITY): Payer: Medicare Other

## 2015-01-24 ENCOUNTER — Telehealth: Payer: Self-pay

## 2015-01-24 MED ORDER — DICLOFENAC SODIUM 1 % TD GEL: 4.0000 g | Freq: Four times a day (QID) | TRANSDERMAL | Status: DC

## 2015-01-24 NOTE — Telephone Encounter (Signed)
Pt called, says her cardiologist said volteran gel would be fine.  Pt requests it be sent to [facility] on [address].

## 2015-01-29 ENCOUNTER — Telehealth (HOSPITAL_COMMUNITY): Payer: Self-pay | Admitting: *Deleted

## 2015-01-29 ENCOUNTER — Encounter (HOSPITAL_COMMUNITY): Payer: Medicare Other

## 2015-01-29 ENCOUNTER — Encounter (HOSPITAL_COMMUNITY): Admission: RE | Admit: 2015-01-29 | Payer: Medicare Other | Source: Ambulatory Visit

## 2015-01-31 ENCOUNTER — Encounter (HOSPITAL_COMMUNITY)
Admission: RE | Admit: 2015-01-31 | Discharge: 2015-01-31 | Disposition: A | Discharge status: Home / Self Care | Payer: Medicare Other | Source: Ambulatory Visit | Attending: Internal Medicine | Admitting: Internal Medicine

## 2015-01-31 ENCOUNTER — Encounter (HOSPITAL_COMMUNITY): Payer: Medicare Other

## 2015-01-31 DIAGNOSIS — Z79899 Other long term (current) drug therapy: Secondary | ICD-10-CM | POA: Diagnosis not present

## 2015-01-31 DIAGNOSIS — I5022 Chronic systolic (congestive) heart failure: Secondary | ICD-10-CM | POA: Diagnosis not present

## 2015-01-31 LAB — GLUCOSE, CAPILLARY: GLUCOSE-CAPILLARY: 159 mg/dL — AB (ref 65–99)

## 2015-02-02 ENCOUNTER — Encounter (HOSPITAL_COMMUNITY): Payer: Medicare Other

## 2015-02-02 ENCOUNTER — Encounter (HOSPITAL_COMMUNITY)
Admission: RE | Admit: 2015-02-02 | Discharge: 2015-02-02 | Disposition: A | Discharge status: Home / Self Care | Payer: Medicare Other | Source: Ambulatory Visit | Attending: Internal Medicine | Admitting: Internal Medicine

## 2015-02-02 DIAGNOSIS — I5022 Chronic systolic (congestive) heart failure: Secondary | ICD-10-CM | POA: Diagnosis not present

## 2015-02-02 DIAGNOSIS — Z79899 Other long term (current) drug therapy: Secondary | ICD-10-CM | POA: Diagnosis not present

## 2015-02-02 LAB — GLUCOSE, CAPILLARY
GLUCOSE-CAPILLARY: 103 mg/dL — AB (ref 65–99)
Glucose-Capillary: 147 mg/dL — ABNORMAL HIGH (ref 65–99)

## 2015-02-05 ENCOUNTER — Encounter (HOSPITAL_COMMUNITY): Payer: Medicare Other

## 2015-02-05 ENCOUNTER — Encounter (HOSPITAL_COMMUNITY)
Admission: RE | Admit: 2015-02-05 | Discharge: 2015-02-05 | Disposition: A | Discharge status: Home / Self Care | Payer: Medicare Other | Source: Ambulatory Visit | Attending: Internal Medicine | Admitting: Internal Medicine

## 2015-02-05 ENCOUNTER — Encounter: Payer: Self-pay | Admitting: Internal Medicine

## 2015-02-05 DIAGNOSIS — Z79899 Other long term (current) drug therapy: Secondary | ICD-10-CM | POA: Diagnosis not present

## 2015-02-05 DIAGNOSIS — I5022 Chronic systolic (congestive) heart failure: Secondary | ICD-10-CM | POA: Diagnosis not present

## 2015-02-06 LAB — GLUCOSE, CAPILLARY
Glucose-Capillary: 80 mg/dL (ref 65–99)
Glucose-Capillary: 183 mg/dL — ABNORMAL HIGH (ref 65–99)

## 2015-02-07 ENCOUNTER — Other Ambulatory Visit: Payer: Self-pay

## 2015-02-07 ENCOUNTER — Other Ambulatory Visit: Payer: Self-pay | Admitting: Internal Medicine

## 2015-02-07 ENCOUNTER — Encounter (HOSPITAL_COMMUNITY): Payer: Medicare Other

## 2015-02-07 ENCOUNTER — Encounter (HOSPITAL_COMMUNITY)
Admission: RE | Admit: 2015-02-07 | Discharge: 2015-02-07 | Disposition: A | Discharge status: Home / Self Care | Payer: Medicare Other | Source: Ambulatory Visit | Attending: Internal Medicine | Admitting: Internal Medicine

## 2015-02-07 DIAGNOSIS — Z79899 Other long term (current) drug therapy: Secondary | ICD-10-CM | POA: Diagnosis not present

## 2015-02-07 DIAGNOSIS — I5022 Chronic systolic (congestive) heart failure: Secondary | ICD-10-CM | POA: Diagnosis not present

## 2015-02-07 DIAGNOSIS — Z1231 Encounter for screening mammogram for malignant neoplasm of breast: Secondary | ICD-10-CM

## 2015-02-07 LAB — GLUCOSE, CAPILLARY: Glucose-Capillary: 185 mg/dL — ABNORMAL HIGH (ref 65–99)

## 2015-02-09 ENCOUNTER — Encounter (HOSPITAL_COMMUNITY): Payer: Medicare Other

## 2015-02-09 ENCOUNTER — Encounter (HOSPITAL_COMMUNITY)
Admission: RE | Admit: 2015-02-09 | Discharge: 2015-02-09 | Disposition: A | Discharge status: Home / Self Care | Payer: Medicare Other | Source: Ambulatory Visit | Attending: Internal Medicine | Admitting: Internal Medicine

## 2015-02-09 DIAGNOSIS — I5022 Chronic systolic (congestive) heart failure: Secondary | ICD-10-CM | POA: Diagnosis not present

## 2015-02-09 DIAGNOSIS — Z79899 Other long term (current) drug therapy: Secondary | ICD-10-CM | POA: Diagnosis not present

## 2015-02-09 LAB — GLUCOSE, CAPILLARY: Glucose-Capillary: 118 mg/dL — ABNORMAL HIGH (ref 65–99)

## 2015-02-12 ENCOUNTER — Ambulatory Visit (HOSPITAL_COMMUNITY)
Admission: RE | Admit: 2015-02-12 | Discharge: 2015-02-12 | Disposition: A | Discharge status: Home / Self Care | Payer: Medicare Other | Source: Ambulatory Visit | Attending: Cardiology | Admitting: Cardiology

## 2015-02-12 ENCOUNTER — Encounter (HOSPITAL_COMMUNITY): Payer: Medicare Other

## 2015-02-12 ENCOUNTER — Telehealth (HOSPITAL_COMMUNITY): Payer: Self-pay | Admitting: Vascular Surgery

## 2015-02-12 DIAGNOSIS — I5022 Chronic systolic (congestive) heart failure: Secondary | ICD-10-CM

## 2015-02-12 LAB — BASIC METABOLIC PANEL
Anion gap: 11 (ref 5–15)
BUN: 37 mg/dL — AB (ref 6–20)
CO2: 24 mmol/L (ref 22–32)
CREATININE: 1.54 mg/dL — AB (ref 0.44–1.00)
Calcium: 9.3 mg/dL (ref 8.9–10.3)
Chloride: 101 mmol/L (ref 101–111)
GFR, EST AFRICAN AMERICAN: 42 mL/min — AB (ref >60)
GFR, EST NON AFRICAN AMERICAN: 36 mL/min — AB (ref >60)
GLUCOSE: 201 mg/dL — AB (ref 65–99)
POTASSIUM: 3.6 mmol/L (ref 3.5–5.1)
SODIUM: 136 mmol/L (ref 135–145)

## 2015-02-12 MED ORDER — LOSARTAN POTASSIUM 25 MG PO TABS: ORAL_TABLET | ORAL | Status: DC

## 2015-02-12 MED ORDER — METOLAZONE 2.5 MG PO TABS: 2.5000 mg | ORAL_TABLET | ORAL | Status: DC

## 2015-02-12 NOTE — Telephone Encounter (Signed)
Pt called her insurance wil not pay for her Metolazone at [place] she wants to know if it could be called in to [facility].. Please advise

## 2015-02-13 MED ORDER — METOLAZONE 2.5 MG PO TABS: 2.5000 mg | ORAL_TABLET | ORAL | Status: DC

## 2015-02-14 ENCOUNTER — Encounter (HOSPITAL_COMMUNITY)
Admission: RE | Admit: 2015-02-14 | Discharge: 2015-02-14 | Disposition: A | Discharge status: Home / Self Care | Payer: Medicare Other | Source: Ambulatory Visit | Attending: Internal Medicine | Admitting: Internal Medicine

## 2015-02-14 ENCOUNTER — Encounter (HOSPITAL_COMMUNITY): Payer: Medicare Other

## 2015-02-14 DIAGNOSIS — I5022 Chronic systolic (congestive) heart failure: Secondary | ICD-10-CM | POA: Diagnosis not present

## 2015-02-14 DIAGNOSIS — Z79899 Other long term (current) drug therapy: Secondary | ICD-10-CM | POA: Diagnosis not present

## 2015-02-14 LAB — GLUCOSE, CAPILLARY: GLUCOSE-CAPILLARY: 110 mg/dL — AB (ref 65–99)

## 2015-02-16 ENCOUNTER — Encounter (HOSPITAL_COMMUNITY): Payer: Medicare Other

## 2015-02-16 ENCOUNTER — Encounter (HOSPITAL_COMMUNITY)
Admission: RE | Admit: 2015-02-16 | Discharge: 2015-02-16 | Disposition: A | Discharge status: Home / Self Care | Payer: Medicare Other | Source: Ambulatory Visit | Attending: Internal Medicine | Admitting: Internal Medicine

## 2015-02-16 DIAGNOSIS — I5022 Chronic systolic (congestive) heart failure: Secondary | ICD-10-CM | POA: Diagnosis not present

## 2015-02-16 DIAGNOSIS — Z79899 Other long term (current) drug therapy: Secondary | ICD-10-CM | POA: Diagnosis not present

## 2015-02-16 LAB — GLUCOSE, CAPILLARY
GLUCOSE-CAPILLARY: 96 mg/dL (ref 65–99)
Glucose-Capillary: 136 mg/dL — ABNORMAL HIGH (ref 65–99)

## 2015-02-19 ENCOUNTER — Encounter (HOSPITAL_COMMUNITY)
Admission: RE | Admit: 2015-02-19 | Discharge: 2015-02-19 | Disposition: A | Discharge status: Home / Self Care | Payer: Medicare Other | Source: Ambulatory Visit | Attending: Internal Medicine | Admitting: Internal Medicine

## 2015-02-19 ENCOUNTER — Encounter (HOSPITAL_COMMUNITY): Payer: Medicare Other

## 2015-02-19 DIAGNOSIS — Z79899 Other long term (current) drug therapy: Secondary | ICD-10-CM | POA: Diagnosis not present

## 2015-02-19 DIAGNOSIS — I5022 Chronic systolic (congestive) heart failure: Secondary | ICD-10-CM | POA: Diagnosis not present

## 2015-02-19 LAB — GLUCOSE, CAPILLARY
Glucose-Capillary: 113 mg/dL — ABNORMAL HIGH (ref 65–99)
Glucose-Capillary: 106 mg/dL — ABNORMAL HIGH (ref 65–99)

## 2015-02-19 NOTE — Progress Notes (Signed)
[name] 59 y.o. female Nutrition Note Spoke with pt. Nutrition Plan and Nutrition Survey goals reviewed with pt. Pt is not currently following the Therapeutic Lifestyle Changes diet. Pt reports she has made dietary changes (e.g. Decreased fried food consumed and increased fruit and vegetable consumption). Pt is diabetic. Last A1c indicates blood glucose fairly well-controlled. Pt reports her last A1c was 7.0. This Probation officer went over Diabetes Education test results. Pt checks CBG's once daily. Fasting CBG's reportedly 105-110 mg/dL. Pt with dx of CHF. Per discussion, pt choosing lower sodium foods most of the time. Pt eats out infrequently. Pt expressed understanding of the information reviewed. Pt aware of nutrition education classes offered. Lab Results  Component Value Date   HGBA1C 7.1 01/17/2015   Nutrition Diagnosis ? Food-and nutrition-related knowledge deficit related to lack of exposure to information as related to diagnosis of: ? CVD ? DM ? Obesity related to excessive energy intake as evidenced by a BMI of 32.8  Nutrition RX/ Estimated Daily Nutrition Needs for: wt loss 1250-1750 Kcal, 30-45 gm fat, 8-13 gm sat fat, 1.2-1.7 gm trans-fat, <1500 mg sodium, 150-175 gm CHO   Nutrition Intervention ? Pt's individual nutrition plan reviewed with pt. ? Benefits of adopting Therapeutic Lifestyle Changes discussed when Medficts reviewed. ? Pt to attend the Portion Distortion class ? Pt to attend the Diabetes Q & A class  ? Pt given handouts for: ? Nutrition I class ? Nutrition II class ? Diabetes Blitz Class ? Continue client-centered nutrition education by RD, as part of interdisciplinary care. Goal(s) ? Pt to identify and limit food sources of saturated fat, trans fat, and cholesterol ? Pt to identify food quantities necessary to achieve: ? wt loss to a goal wt of 172-190 lb (78.6-86.8 kg) at graduation from cardiac rehab.  ? CBG concentrations in the normal range or as close to  normal as is safely possible. Monitor and Evaluate progress toward nutrition goal with team. Nutrition Risk: Change to Moderate [name], M.Ed, RD, LDN, CDE 02/19/2015 3:06 PM

## 2015-02-19 NOTE — Progress Notes (Signed)
No psychosocial needs identfied, no interventions necessary.  Will continue to monitor.

## 2015-02-21 ENCOUNTER — Encounter (HOSPITAL_COMMUNITY): Payer: Medicare Other

## 2015-02-21 ENCOUNTER — Encounter (HOSPITAL_COMMUNITY)
Admission: RE | Admit: 2015-02-21 | Discharge: 2015-02-21 | Disposition: A | Discharge status: Home / Self Care | Payer: Medicare Other | Source: Ambulatory Visit | Attending: Internal Medicine | Admitting: Internal Medicine

## 2015-02-21 DIAGNOSIS — Z79899 Other long term (current) drug therapy: Secondary | ICD-10-CM | POA: Diagnosis not present

## 2015-02-21 DIAGNOSIS — I5022 Chronic systolic (congestive) heart failure: Secondary | ICD-10-CM | POA: Diagnosis not present

## 2015-02-21 LAB — GLUCOSE, CAPILLARY: GLUCOSE-CAPILLARY: 174 mg/dL — AB (ref 65–99)

## 2015-02-22 ENCOUNTER — Other Ambulatory Visit (HOSPITAL_COMMUNITY): Payer: Self-pay | Admitting: Internal Medicine

## 2015-02-23 ENCOUNTER — Encounter (HOSPITAL_COMMUNITY)
Admission: RE | Admit: 2015-02-23 | Discharge: 2015-02-23 | Disposition: A | Discharge status: Home / Self Care | Payer: Medicare Other | Source: Ambulatory Visit | Attending: Internal Medicine | Admitting: Internal Medicine

## 2015-02-23 ENCOUNTER — Encounter (HOSPITAL_COMMUNITY): Payer: Medicare Other

## 2015-02-23 ENCOUNTER — Encounter (HOSPITAL_COMMUNITY): Payer: Self-pay

## 2015-02-23 DIAGNOSIS — Z79899 Other long term (current) drug therapy: Secondary | ICD-10-CM | POA: Diagnosis not present

## 2015-02-23 DIAGNOSIS — I5022 Chronic systolic (congestive) heart failure: Secondary | ICD-10-CM | POA: Diagnosis not present

## 2015-02-23 LAB — GLUCOSE, CAPILLARY: Glucose-Capillary: 143 mg/dL — ABNORMAL HIGH (ref 65–99)

## 2015-02-23 NOTE — Progress Notes (Signed)
Pt plans to graduate on 03/02/2015 from Phase II.  Pt is exiting program early due to high insurance deductible in [place].  Pt did have some absences however overall had great participation.  Pt progressed nicely during her participation in rehab as evidenced by increased MET level.   Medication list reconciled. Pt has seen improvement in her glycemic control.  Repeat  PHQ score- 0 .  Pt has made significant lifestyle changes and should be commended for her success. Pt feels she has achieved her goals during cardiac rehab which include developing an exercise routine.  Pt plans to walk on her own at home.  Pt family has given her a Fitbit to track her progress and she feels this will be a positive motivator for her.

## 2015-02-26 ENCOUNTER — Other Ambulatory Visit: Payer: Self-pay | Admitting: Internal Medicine

## 2015-02-28 ENCOUNTER — Encounter (HOSPITAL_COMMUNITY): Payer: Medicare Other

## 2015-02-28 ENCOUNTER — Encounter (HOSPITAL_COMMUNITY)
Admission: RE | Admit: 2015-02-28 | Discharge: 2015-02-28 | Disposition: A | Discharge status: Home / Self Care | Payer: Medicare Other | Source: Ambulatory Visit | Attending: Internal Medicine | Admitting: Internal Medicine

## 2015-02-28 DIAGNOSIS — Z79899 Other long term (current) drug therapy: Secondary | ICD-10-CM | POA: Diagnosis not present

## 2015-02-28 DIAGNOSIS — I5022 Chronic systolic (congestive) heart failure: Secondary | ICD-10-CM | POA: Diagnosis not present

## 2015-02-28 LAB — GLUCOSE, CAPILLARY: GLUCOSE-CAPILLARY: 137 mg/dL — AB (ref 65–99)

## 2015-03-02 ENCOUNTER — Encounter (HOSPITAL_COMMUNITY): Payer: Medicare Other

## 2015-03-02 ENCOUNTER — Encounter (HOSPITAL_COMMUNITY)
Admission: RE | Admit: 2015-03-02 | Discharge: 2015-03-02 | Disposition: A | Discharge status: Home / Self Care | Payer: Medicare Other | Source: Ambulatory Visit | Attending: Internal Medicine | Admitting: Internal Medicine

## 2015-03-02 DIAGNOSIS — Z79899 Other long term (current) drug therapy: Secondary | ICD-10-CM | POA: Diagnosis not present

## 2015-03-02 DIAGNOSIS — I5022 Chronic systolic (congestive) heart failure: Secondary | ICD-10-CM | POA: Diagnosis not present

## 2015-03-02 LAB — GLUCOSE, CAPILLARY: GLUCOSE-CAPILLARY: 157 mg/dL — AB (ref 65–99)

## 2015-03-07 ENCOUNTER — Encounter (HOSPITAL_COMMUNITY): Payer: Medicare Other

## 2015-03-09 ENCOUNTER — Encounter (HOSPITAL_COMMUNITY): Payer: Medicare Other

## 2015-03-12 ENCOUNTER — Telehealth: Payer: Self-pay | Admitting: Internal Medicine

## 2015-03-12 ENCOUNTER — Encounter (HOSPITAL_COMMUNITY): Payer: Medicare Other

## 2015-03-12 ENCOUNTER — Ambulatory Visit: Payer: Self-pay

## 2015-03-12 ENCOUNTER — Encounter: Payer: Self-pay | Admitting: Internal Medicine

## 2015-03-12 NOTE — Telephone Encounter (Signed)
Patient states that her Carelink monitor is beeping. 800# given.

## 2015-03-12 NOTE — Telephone Encounter (Signed)
New Message   Pt calling to speak to device about it beeping

## 2015-03-14 ENCOUNTER — Encounter (HOSPITAL_COMMUNITY): Payer: Medicare Other

## 2015-03-16 ENCOUNTER — Ambulatory Visit
Admission: RE | Admit: 2015-03-16 | Discharge: 2015-03-16 | Disposition: A | Discharge status: Home / Self Care | Payer: Medicare Other | Source: Ambulatory Visit

## 2015-03-16 ENCOUNTER — Encounter (HOSPITAL_COMMUNITY): Admission: RE | Admit: 2015-03-16 | Payer: Medicare Other | Source: Ambulatory Visit

## 2015-03-16 DIAGNOSIS — Z1231 Encounter for screening mammogram for malignant neoplasm of breast: Secondary | ICD-10-CM

## 2015-03-16 NOTE — Progress Notes (Signed)
Patient Care Team: Zada Finders, MD as PCP - General Deboraha Sprang, MD (Cardiology) Jolaine Artist, MD (Cardiology)   HPI  [name] is a 60 y.o. 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  Catheterization 2013-January demonstrated patent grafts cardiac index 2.2 ejection fraction 10-15%  Electrocardiogram April 2013 demonstrated a QRS duration of 124 ms repeat today is 126.  She was seen a couple of months ago for consideration of CRT upgrade for her congestive heart failure class III. There was some discrepancy as to her functional status and she was submitted for cardiopulmonary stress testing which demonstrated a low VO2 max-14.9 with the recommendation that we proceed with CRT upgrade. Initially she had just a modest prolongation of the QRS with a pattern that looked more like IVCD. CRT upgrade was deferred. However, In the fall, October, she underwent CRT upgrade following hospitalization for polymorphic ventricular tachycardia and interval worsening of the QRS duration.   Chart was reviewed. She has been on home milrinone. This was complicated by possible catheter infection. She has been seen at [facility] for transplant evaluation. There is some concern that she will need LVAD placement prior to that.   Past Medical History  Diagnosis Date  . Coronary artery disease     a. s/p CABG x 5 with MV annuloplasty 2007   . Diabetes mellitus   . Hypertension   . Chronic systolic heart failure ([place])     a. ICM b. ECHO (06/2013): EF 15%, severe MR (06/2013) c. RHC (10/2013): RA 5, RV 23/2/5, PA 25/6 (14), PCWP 12, Fick CO/CI: 3.2/1.7, PVR 0.7 WU, PA 45%, 47% and 55% (with levophed 5), vagal response during cath. d. On home milrinone.  . Polysubstance abuse     history of  (cocaine, tobacco and ETOH)  . V-tach ([place])   . Ischemic cardiomyopathy     a. s/p ICD. b. LHC  (10/04/13): 1. Severe native CAD with all bypass grafts patent. c. CRT upgrade 12/2013.  . Implantable defibrillator   medtronic   . LBBB (left bundle branch block)   . AICD (automatic cardioverter/defibrillator) present   . PICC line infection     a. 01/2014 - PICC exchanged.  . CKD (chronic kidney disease), stage III   . PAD (peripheral artery disease) ([place])   . Lipoma     Past Surgical History  Procedure Laterality Date  . Coronary artery bypass graft    . Cardiac catheterization    . Cardiac defibrillator placement      2011, Followed By Dr. Caryl Comes  . Tee without cardioversion N/A 06/02/2013    Procedure: TRANSESOPHAGEAL ECHOCARDIOGRAM (TEE);  Surgeon: Larey Dresser, MD;  Location: [place];  Service: Cardiovascular;  Laterality: N/A;  . Bi-ventricular implantable cardioverter defibrillator upgrade  12/02/2013    MDT [place] CRTD upgrade by Dr Caryl Comes  . Left and right heart catheterization with coronary/graft angiogram N/A 03/17/2011    Procedure: LEFT AND RIGHT HEART CATHETERIZATION WITH Beatrix Fetters;  Surgeon: Jolaine Artist, MD;  Location: [facility] CATH LAB;  Service: Cardiovascular;  Laterality: N/A;  . Right heart catheterization N/A 05/23/2013    Procedure: RIGHT HEART CATH;  Surgeon: Jolaine Artist, MD;  Location: [facility] CATH LAB;  Service: Cardiovascular;  Laterality: N/A;  . Left and right heart catheterization with coronary/graft angiogram N/A 10/04/2013    Procedure: LEFT AND RIGHT  HEART CATHETERIZATION WITH Isabel Caprice;  Surgeon: Dolores Patty, MD;  Location: [facility] CATH LAB;  Service: Cardiovascular;  Laterality: N/A;  . Right heart catheterization N/A 11/01/2013    Procedure: RIGHT HEART CATH;  Surgeon: Dolores Patty, MD;  Location: [facility] CATH LAB;  Service: Cardiovascular;  Laterality: N/A;  . Right heart catheterization N/A 11/25/2013    Procedure: RIGHT HEART CATH;  Surgeon: Dolores Patty, MD;  Location: [facility] CATH LAB;  Service: Cardiovascular;   Laterality: N/A;  . Bi-ventricular implantable cardioverter defibrillator upgrade N/A 12/02/2013    Procedure: BI-VENTRICULAR IMPLANTABLE CARDIOVERTER DEFIBRILLATOR UPGRADE;  Surgeon: Duke Salvia, MD;  Location: [facility] CATH LAB;  Service: Cardiovascular;  Laterality: N/A;  . Colonoscopy with propofol N/A 06/08/2014    Procedure: COLONOSCOPY WITH PROPOFOL;  Surgeon: Charlott Rakes, MD;  Location: [facility] ENDOSCOPY;  Service: Endoscopy;  Laterality: N/A;    Current Outpatient Prescriptions  Medication Sig Dispense Refill  . ACCU-CHEK FASTCLIX LANCETS MISC Use to test blood glucose 2 times daily. Dx:E11.9 102 each 12  . acetaminophen (TYLENOL) 325 MG tablet Take 650 mg by mouth every 6 (six) hours as needed for mild pain.    [name] allopurinol (ZYLOPRIM) 100 MG tablet TAKE ONE TO TWO TABLETS BY MOUTH TWICE DAILY. TAKE 200 MG (2 TABLETS) IN THE MORNING AND 100 MG (1 TABLET) IN THE EVENING. 90 tablet 0  . amiodarone (PACERONE) 200 MG tablet Take 1 tablet (200 mg total) by mouth daily. 30 tablet 0  . amoxicillin (AMOXIL) 500 MG tablet Take 2 tablets 1 hour prior to dental work and 2 tablets after procedure 4 tablet 0  . aspirin EC 81 MG tablet Take 81 mg by mouth every morning.     [name] atorvastatin (LIPITOR) 80 MG tablet Take 1 tablet (80 mg total) by mouth daily. 30 tablet 3  . Blood Glucose Monitoring Suppl (ACCU-CHEK NANO SMARTVIEW) W/DEVICE KIT Use to test blood glucose 2 times daily. Dx:E11.9 1 kit 0  . diclofenac sodium (VOLTAREN) 1 % GEL Apply 4 g topically 4 (four) times daily. 100 g 0  . digoxin (LANOXIN) 0.125 MG tablet TAKE ONE-HALF TABLET BY MOUTH EVERY OTHER DAY 15 tablet 0  . furosemide (LASIX) 80 MG tablet Take 80 mg in AM and 40 mg in PM 90 tablet 3  . glucose blood (ACCU-CHEK SMARTVIEW) test strip Use to test blood glucose 2 times daily. Dx:E11.9 100 each 12  . Insulin Pen Needle 31G X 8 MM MISC 1 each by Does not apply route 3 (three) times daily. Inject insulin 3 times daily.  Insulin dependent  DM E11.9. 100 each 11  . LANTUS SOLOSTAR 100 UNIT/ML Solostar Pen INJECT 30 UNITS INTO THE SKIN AT BEDTIME 15 pen 0  . losartan (COZAAR) 25 MG tablet TAKE ONE-HALF TABLET BY MOUTH TWICE DAILY 30 tablet 4  . metolazone (ZAROXOLYN) 2.5 MG tablet Take 1 tablet (2.5 mg total) by mouth once a week. 30 tablet 3  . nitroGLYCERIN (NITROSTAT) 0.4 MG SL tablet Place 1 tablet (0.4 mg total) under the tongue every 5 (five) minutes as needed. For chest pain. 25 tablet 11  . potassium chloride SA (K-DUR,KLOR-CON) 20 MEQ tablet Take 20 mEq by mouth as needed (take one tablet when you take metolazone).    [name] spironolactone (ALDACTONE) 25 MG tablet Take 25 mg by mouth daily.    . [DISCONTINUED] sitaGLIPtin (JANUVIA) 25 MG tablet Take 1 tablet (25 mg total) by mouth daily. 30 tablet 1   No current  facility-administered medications for this visit.    No Known Allergies  Review of Systems negative except from HPI and PMH  Physical Exam There were no vitals taken for this visit. Well developed and well nourished in no acute distress HENT normal E scleral and icterus clear Neck Supple JVP flat; carotids brisk and full Clear to ausculation Device pocket well healed; without hematoma or erythema.  There is no tethering but there is a keloid which is very sensitive to touc  Regular rate and rhythm, no murmurs gallops or rub Soft with active bowel sounds No clubbing cyanosis no Edema Alert and oriented, grossly normal motor and sensory function Skin Warm and Dry  ECG today demonstrates sinus rhythm at 64 intervals  P-synchronous/ BiV  pacing   Assessment and  Plan  Nonischemic/valvular cardiomyopathy  Coronary artery disease Without symptoms of ischemia  LBBB  CHF chronic systolic   Keloid   Implantable defibrillator-Medtronic _ CRT The patient's device was interrogated.  The information was reviewed. No changes were made in the programming.     The patient is euvolemic. Her blood pressure  remains low precluding dose up titration. Last digoxin level was 0.3. This could potentially be increased.   The keloid is quite painful with hypersensitivity  I have been in contact with Dr Marica Otter plastics to see if he might be able to help [place] with this

## 2015-03-19 ENCOUNTER — Encounter: Payer: Medicare Other | Admitting: Internal Medicine

## 2015-04-02 ENCOUNTER — Encounter: Payer: Self-pay | Admitting: Internal Medicine

## 2015-04-05 ENCOUNTER — Other Ambulatory Visit (HOSPITAL_COMMUNITY): Payer: Self-pay | Admitting: Internal Medicine

## 2015-04-05 NOTE — Progress Notes (Signed)
Electrophysiology Office Note Date: 04/06/2015  ID:  [name], [name] 15-Jun-1955, MRN [id number]  PCP: Zada Finders, MD Primary Cardiologist: Bensimhon Electrophysiologist: Caryl Comes  CC: Routine ICD follow-up  [name] is a 60 y.o. female seen today for Dr Caryl Comes.  She presents today for routine electrophysiology followup.  Since last being seen in our clinic, the patient reports doing reasonably well.  She is followed closely in the AHF clinic as well as intermittently at [facility] for transplant evaluation.  She feels like she is retaining fluid more recently and that the Lasix isn't as effective as it used to be. She takes prn Metolazone.  She denies chest pain, palpitations, PND, orthopnea, nausea, vomiting, dizziness, syncope, edema, weight gain, or early satiety.  She has not had ICD shocks.   Device History: MDT single chamber ICD implanted 2011 for mixed cardiomyopathy; upgrade to CRTD 2015 History of appropriate therapy: Yes History of AAD therapy: Yes - amiodarone    Past Medical History  Diagnosis Date  . Coronary artery disease     a. s/p CABG x 5 with MV annuloplasty 2007   . Diabetes mellitus   . Hypertension   . Chronic systolic heart failure ([place])     a. ICM b. ECHO (06/2013): EF 15%, severe MR (06/2013) c. RHC (10/2013): RA 5, RV 23/2/5, PA 25/6 (14), PCWP 12, Fick CO/CI: 3.2/1.7, PVR 0.7 WU, PA 45%, 47% and 55% (with levophed 5), vagal response during cath. d. On home milrinone.  . Polysubstance abuse     history of  (cocaine, tobacco and ETOH)  . V-tach ([place])   . Ischemic cardiomyopathy     a. s/p ICD. b. LHC (10/04/13): 1. Severe native CAD with all bypass grafts patent. c. CRT upgrade 12/2013.  . Implantable defibrillator   medtronic   . LBBB (left bundle branch block)   . AICD (automatic cardioverter/defibrillator) present   . PICC line infection     a. 01/2014 - PICC exchanged.  . CKD (chronic kidney disease), stage III   . PAD (peripheral artery disease)  ([place])   . Lipoma    Past Surgical History  Procedure Laterality Date  . Coronary artery bypass graft    . Cardiac catheterization    . Cardiac defibrillator placement      2011, Followed By Dr. Caryl Comes  . Tee without cardioversion N/A 06/02/2013    Procedure: TRANSESOPHAGEAL ECHOCARDIOGRAM (TEE);  Surgeon: Larey Dresser, MD;  Location: [place];  Service: Cardiovascular;  Laterality: N/A;  . Bi-ventricular implantable cardioverter defibrillator upgrade  12/02/2013    MDT [place] CRTD upgrade by Dr Caryl Comes  . Left and right heart catheterization with coronary/graft angiogram N/A 03/17/2011    Procedure: LEFT AND RIGHT HEART CATHETERIZATION WITH Beatrix Fetters;  Surgeon: Jolaine Artist, MD;  Location: [facility] CATH LAB;  Service: Cardiovascular;  Laterality: N/A;  . Right heart catheterization N/A 05/23/2013    Procedure: RIGHT HEART CATH;  Surgeon: Jolaine Artist, MD;  Location: [facility] CATH LAB;  Service: Cardiovascular;  Laterality: N/A;  . Left and right heart catheterization with coronary/graft angiogram N/A 10/04/2013    Procedure: LEFT AND RIGHT HEART CATHETERIZATION WITH Beatrix Fetters;  Surgeon: Jolaine Artist, MD;  Location: [facility] CATH LAB;  Service: Cardiovascular;  Laterality: N/A;  . Right heart catheterization N/A 11/01/2013    Procedure: RIGHT HEART CATH;  Surgeon: Jolaine Artist, MD;  Location: [facility] CATH LAB;  Service: Cardiovascular;  Laterality: N/A;  . Right heart catheterization  N/A 11/25/2013    Procedure: RIGHT HEART CATH;  Surgeon: Jolaine Artist, MD;  Location: [facility] CATH LAB;  Service: Cardiovascular;  Laterality: N/A;  . Bi-ventricular implantable cardioverter defibrillator upgrade N/A 12/02/2013    Procedure: BI-VENTRICULAR IMPLANTABLE CARDIOVERTER DEFIBRILLATOR UPGRADE;  Surgeon: Deboraha Sprang, MD;  Location: [facility] CATH LAB;  Service: Cardiovascular;  Laterality: N/A;  . Colonoscopy with propofol N/A 06/08/2014    Procedure: COLONOSCOPY WITH PROPOFOL;   Surgeon: Wilford Corner, MD;  Location: [facility] ENDOSCOPY;  Service: Endoscopy;  Laterality: N/A;    Current Outpatient Prescriptions  Medication Sig Dispense Refill  . ACCU-CHEK FASTCLIX LANCETS MISC Use to test blood glucose 2 times daily. Dx:E11.9 102 each 12  . acetaminophen (TYLENOL) 325 MG tablet Take 650 mg by mouth every 6 (six) hours as needed for mild pain.    [name] allopurinol (ZYLOPRIM) 100 MG tablet TAKE ONE TO TWO TABLETS BY MOUTH TWICE DAILY. TAKE  '200MG'$   (2  TABLETS)  IN  THE  MORNING  AND  100  MG  (1  TABLET)  IN  THE  EVENING 90 tablet 0  . amiodarone (PACERONE) 200 MG tablet Take 1 tablet (200 mg total) by mouth daily. 30 tablet 0  . amoxicillin (AMOXIL) 500 MG tablet Take 2 tablets 1 hour prior to dental work and 2 tablets after procedure 4 tablet 0  . aspirin EC 81 MG tablet Take 81 mg by mouth every morning.     [name] atorvastatin (LIPITOR) 80 MG tablet Take 1 tablet (80 mg total) by mouth daily. 30 tablet 3  . Blood Glucose Monitoring Suppl (ACCU-CHEK NANO SMARTVIEW) W/DEVICE KIT Use to test blood glucose 2 times daily. Dx:E11.9 1 kit 0  . diclofenac sodium (VOLTAREN) 1 % GEL Apply 4 g topically 4 (four) times daily. 100 g 0  . digoxin (LANOXIN) 0.125 MG tablet TAKE ONE-HALF TABLET BY MOUTH EVERY OTHER DAY 15 tablet 0  . furosemide (LASIX) 80 MG tablet Take 80 mg in AM and 40 mg in PM 90 tablet 3  . glucose blood (ACCU-CHEK SMARTVIEW) test strip Use to test blood glucose 2 times daily. Dx:E11.9 100 each 12  . Insulin Pen Needle 31G X 8 MM MISC 1 each by Does not apply route 3 (three) times daily. Inject insulin 3 times daily.  Insulin dependent DM E11.9. 100 each 11  . LANTUS SOLOSTAR 100 UNIT/ML Solostar Pen INJECT 30 UNITS INTO THE SKIN AT BEDTIME 15 pen 0  . losartan (COZAAR) 25 MG tablet TAKE ONE-HALF TABLET BY MOUTH TWICE DAILY 30 tablet 4  . metolazone (ZAROXOLYN) 2.5 MG tablet Take 1 tablet (2.5 mg total) by mouth once a week. 30 tablet 3  . nitroGLYCERIN (NITROSTAT) 0.4 MG  SL tablet Place 1 tablet (0.4 mg total) under the tongue every 5 (five) minutes as needed. For chest pain. 25 tablet 11  . potassium chloride SA (K-DUR,KLOR-CON) 20 MEQ tablet Take 20 mEq by mouth as needed (take one tablet when you take metolazone).    [name] spironolactone (ALDACTONE) 25 MG tablet Take 25 mg by mouth daily.    . [DISCONTINUED] sitaGLIPtin (JANUVIA) 25 MG tablet Take 1 tablet (25 mg total) by mouth daily. 30 tablet 1   No current facility-administered medications for this visit.    Allergies:   Review of patient's allergies indicates no known allergies.   Social History: Social History   Social History  . Marital Status: Widowed    Spouse Name: N/A  . Number of Children:  N/A  . Years of Education: N/A   Occupational History  . disable    Social History Main Topics  . Smoking status: Former Smoker    Types: Cigarettes    Quit date: 03/03/2011  . Smokeless tobacco: Not on file  . Alcohol Use: No  . Drug Use: No     Comment: Has history of cocaine use, denies recent  . Sexual Activity: Not Currently   Other Topics Concern  . Not on file   Social History Narrative    Family History: Family History  Problem Relation Age of Onset  . Heart attack Mother     Died age 24  . Diabetes Maternal Grandmother   . Heart attack Cousin   . Heart attack Maternal Uncle     Review of Systems: All other systems reviewed and are otherwise negative except as noted above.   Physical Exam: VS:  BP 112/68 mmHg  Pulse 56  Ht '5\' 5"'$  (1.651 m)  Wt 201 lb (91.173 kg)  BMI 33.45 kg/m2 , BMI Body mass index is 33.45 kg/(m^2).  GEN- The patient is obese appearing, alert and oriented x 3 today.   HEENT: normocephalic, atraumatic; sclera clear, conjunctiva pink; hearing intact; oropharynx clear; neck supple Lungs- Clear to ausculation bilaterally, normal work of breathing.  No wheezes, rales, rhonchi Heart- Regular rate and rhythm (paced) GI- soft, non-tender, non-distended,  bowel sounds present  Extremities- no clubbing, cyanosis, or edema; DP/PT/radial pulses 2+ bilaterally MS- no significant deformity or atrophy Skin- warm and dry, no rash or lesion; ICD pocket well healed Psych- euthymic mood, full affect Neuro- strength and sensation are intact  ICD interrogation- reviewed in detail today,  See PACEART report  EKG:  EKG is ordered today. The ekg ordered today shows sinus rhythm with CRT pacing   Recent Labs: 08/30/2014: TSH 3.637 01/22/2015: ALT 86* 02/12/2015: BUN 37*; Creatinine, Ser 1.54*; Potassium 3.6; Sodium 136   Wt Readings from Last 3 Encounters:  04/06/15 201 lb (91.173 kg)  01/22/15 197 lb (89.359 kg)  01/17/15 202 lb 9.6 oz (91.899 kg)     Other studies Reviewed: Additional studies/ records that were reviewed today include: Dr Olin Pia office notes, HF notes  Assessment and Plan:  1.  Chronic systolic dysfunction Slightly volume overloaded today - she is planning on taking an extra Metolazone on Saturday Followed closely by the HF clinic  Stable on an appropriate medical regimen Normal ICD function See Pace Art report No changes today CBC, BMET today   2.  CAD/ICM No recent ischemic symptoms  Continue current therapy  3.  HTN Stable No change required today  4.  VT No recent recurrence Continue amiodarone, decrease dose to '100mg'$  daily.  If no recurrence in 3 months, could consider discontinuation - LFT's, TSH today, annual eye exams recommended   Current medicines are reviewed at length with the patient today.   The patient does not have concerns regarding her medicines.  The following changes were made today:  Decrease amiodarone to '100mg'$  daily   Labs/ tests ordered today include: BMET, CBC, TSH, LFT's    Disposition:   Follow up with HF clinic, Carelink transmissions, Dr Caryl Comes 1 year    Signed, Chanetta Marshall, NP 04/06/2015 8:04 AM  [facility] [address] [place]  [address] [phone] (office) [phone] (fax)

## 2015-04-06 ENCOUNTER — Ambulatory Visit (INDEPENDENT_AMBULATORY_CARE_PROVIDER_SITE_OTHER): Payer: Medicare Other | Admitting: Nurse Practitioner

## 2015-04-06 ENCOUNTER — Encounter: Payer: Self-pay | Admitting: Internal Medicine

## 2015-04-06 ENCOUNTER — Encounter: Payer: Self-pay | Admitting: Nurse Practitioner

## 2015-04-06 VITALS — BP 112/68 | HR 56 | Ht 65.0 in | Wt 201.0 lb

## 2015-04-06 DIAGNOSIS — I472 Ventricular tachycardia, unspecified: Secondary | ICD-10-CM

## 2015-04-06 DIAGNOSIS — I10 Essential (primary) hypertension: Secondary | ICD-10-CM

## 2015-04-06 DIAGNOSIS — I255 Ischemic cardiomyopathy: Secondary | ICD-10-CM | POA: Diagnosis not present

## 2015-04-06 DIAGNOSIS — I5022 Chronic systolic (congestive) heart failure: Secondary | ICD-10-CM | POA: Diagnosis not present

## 2015-04-06 LAB — BASIC METABOLIC PANEL
BUN: 33 mg/dL — AB (ref 7–25)
CALCIUM: 9.1 mg/dL (ref 8.6–10.4)
CHLORIDE: 101 mmol/L (ref 98–110)
CO2: 28 mmol/L (ref 20–31)
CREATININE: 1.65 mg/dL — AB (ref 0.50–1.05)
Glucose, Bld: 137 mg/dL — ABNORMAL HIGH (ref 65–99)
Potassium: 4.5 mmol/L (ref 3.5–5.3)
Sodium: 141 mmol/L (ref 135–146)

## 2015-04-06 LAB — CUP PACEART INCLINIC DEVICE CHECK
Implantable Lead Implant Date: 20151002
Implantable Lead Implant Date: 20151002
Implantable Lead Location: 753857
Implantable Lead Model: 4396
MDC IDC LEAD IMPLANT DT: 20111012
MDC IDC LEAD LOCATION: 753859
MDC IDC LEAD LOCATION: 753860
MDC IDC SESS DTM: 20170203141923

## 2015-04-06 LAB — HEPATIC FUNCTION PANEL
ALBUMIN: 4.1 g/dL (ref 3.6–5.1)
ALT: 79 U/L — AB (ref 6–29)
AST: 42 U/L — AB (ref 10–35)
Alkaline Phosphatase: 81 U/L (ref 33–130)
Bilirubin, Direct: 0.1 mg/dL (ref <=0.2)
Indirect Bilirubin: 0.3 mg/dL (ref 0.2–1.2)
TOTAL PROTEIN: 7.2 g/dL (ref 6.1–8.1)
Total Bilirubin: 0.4 mg/dL (ref 0.2–1.2)

## 2015-04-06 LAB — CBC
HEMATOCRIT: 38.5 % (ref 36.0–46.0)
Hemoglobin: 12.9 g/dL (ref 12.0–15.0)
MCH: 31.6 pg (ref 26.0–34.0)
MCHC: 33.5 g/dL (ref 30.0–36.0)
MCV: 94.4 fL (ref 78.0–100.0)
MPV: 10.6 fL (ref 8.6–12.4)
PLATELETS: 150 10*3/uL (ref 150–400)
RBC: 4.08 MIL/uL (ref 3.87–5.11)
RDW: 14.8 % (ref 11.5–15.5)
WBC: 5 10*3/uL (ref 4.0–10.5)

## 2015-04-06 LAB — TSH: TSH: 7.524 u[IU]/mL — ABNORMAL HIGH (ref 0.350–4.500)

## 2015-04-06 MED ORDER — AMIODARONE HCL 100 MG PO TABS: 100.0000 mg | ORAL_TABLET | Freq: Every day | ORAL | Status: DC

## 2015-04-06 NOTE — Patient Instructions (Addendum)
Medication Instructions:   START TAKING AMIODARONE 100 MG ONCE A DAY   If you need a refill on your cardiac medications before your next appointment, please call your pharmacy.  Labwork:  BMET CBC TSH LFT    Testing/Procedures: NONE ORDER TODAY   Follow-Up:   Remote monitoring is used to monitor your Pacemaker of ICD from home. This monitoring reduces the number of office visits required to check your device to one time per year. It allows [place] to keep an eye on the functioning of your device to ensure it is working properly. You are scheduled for a device check from home on .07/04/15..You may send your transmission at any time that day. If you have a wireless device, the transmission will be sent automatically. After your physician reviews your transmission, you will receive a postcard with your next transmission date.  Your physician wants you to follow-up in: [place].[name] You will receive a reminder letter in the mail two months in advance. If you don't receive a letter, please call our office to schedule the follow-up appointment.     Any Other Special Instructions Will Be Listed Below (If Applicable).

## 2015-04-23 ENCOUNTER — Ambulatory Visit (HOSPITAL_COMMUNITY)
Admission: RE | Admit: 2015-04-23 | Discharge: 2015-04-23 | Disposition: A | Discharge status: Home / Self Care | Payer: Medicare Other | Source: Ambulatory Visit | Attending: Internal Medicine | Admitting: Internal Medicine

## 2015-04-23 ENCOUNTER — Encounter (HOSPITAL_COMMUNITY): Payer: Self-pay

## 2015-04-23 DIAGNOSIS — I447 Left bundle-branch block, unspecified: Secondary | ICD-10-CM | POA: Diagnosis not present

## 2015-04-23 DIAGNOSIS — I371 Nonrheumatic pulmonary valve insufficiency: Secondary | ICD-10-CM | POA: Insufficient documentation

## 2015-04-23 DIAGNOSIS — E1122 Type 2 diabetes mellitus with diabetic chronic kidney disease: Secondary | ICD-10-CM | POA: Diagnosis not present

## 2015-04-23 DIAGNOSIS — I255 Ischemic cardiomyopathy: Secondary | ICD-10-CM | POA: Diagnosis not present

## 2015-04-23 DIAGNOSIS — N189 Chronic kidney disease, unspecified: Secondary | ICD-10-CM | POA: Diagnosis not present

## 2015-04-23 DIAGNOSIS — I5022 Chronic systolic (congestive) heart failure: Secondary | ICD-10-CM

## 2015-04-23 DIAGNOSIS — I340 Nonrheumatic mitral (valve) insufficiency: Secondary | ICD-10-CM | POA: Diagnosis not present

## 2015-04-23 DIAGNOSIS — I071 Rheumatic tricuspid insufficiency: Secondary | ICD-10-CM | POA: Insufficient documentation

## 2015-04-23 DIAGNOSIS — I130 Hypertensive heart and chronic kidney disease with heart failure and stage 1 through stage 4 chronic kidney disease, or unspecified chronic kidney disease: Secondary | ICD-10-CM | POA: Insufficient documentation

## 2015-04-23 DIAGNOSIS — R29898 Other symptoms and signs involving the musculoskeletal system: Secondary | ICD-10-CM | POA: Insufficient documentation

## 2015-04-23 DIAGNOSIS — I358 Other nonrheumatic aortic valve disorders: Secondary | ICD-10-CM | POA: Diagnosis not present

## 2015-04-23 DIAGNOSIS — I509 Heart failure, unspecified: Secondary | ICD-10-CM | POA: Diagnosis present

## 2015-04-23 NOTE — Addendum Note (Signed)
Encounter addended by: Scarlette Calico, RN on: 04/23/2015  8:19 AM<BR>     Documentation filed: Dx Association, Orders

## 2015-04-23 NOTE — Progress Notes (Signed)
  Echocardiogram 2D Echocardiogram has been performed.  Diamond Nickel 04/23/2015, 9:36 AM

## 2015-04-24 DIAGNOSIS — E119 Type 2 diabetes mellitus without complications: Secondary | ICD-10-CM | POA: Diagnosis not present

## 2015-04-24 DIAGNOSIS — I739 Peripheral vascular disease, unspecified: Secondary | ICD-10-CM | POA: Diagnosis not present

## 2015-04-24 DIAGNOSIS — I2510 Atherosclerotic heart disease of native coronary artery without angina pectoris: Secondary | ICD-10-CM | POA: Diagnosis not present

## 2015-04-24 DIAGNOSIS — I5022 Chronic systolic (congestive) heart failure: Secondary | ICD-10-CM | POA: Diagnosis not present

## 2015-04-24 DIAGNOSIS — Z95810 Presence of automatic (implantable) cardiac defibrillator: Secondary | ICD-10-CM | POA: Diagnosis not present

## 2015-04-24 DIAGNOSIS — Z794 Long term (current) use of insulin: Secondary | ICD-10-CM | POA: Diagnosis not present

## 2015-04-26 ENCOUNTER — Encounter (HOSPITAL_COMMUNITY): Payer: Self-pay | Admitting: Internal Medicine

## 2015-04-26 ENCOUNTER — Ambulatory Visit (HOSPITAL_COMMUNITY)
Admission: RE | Admit: 2015-04-26 | Discharge: 2015-04-26 | Disposition: A | Discharge status: Home / Self Care | Payer: Medicare Other | Source: Ambulatory Visit | Attending: Internal Medicine | Admitting: Internal Medicine

## 2015-04-26 VITALS — BP 110/64 | HR 64 | Wt 200.2 lb

## 2015-04-26 DIAGNOSIS — I130 Hypertensive heart and chronic kidney disease with heart failure and stage 1 through stage 4 chronic kidney disease, or unspecified chronic kidney disease: Secondary | ICD-10-CM | POA: Diagnosis not present

## 2015-04-26 DIAGNOSIS — I252 Old myocardial infarction: Secondary | ICD-10-CM | POA: Insufficient documentation

## 2015-04-26 DIAGNOSIS — Z7982 Long term (current) use of aspirin: Secondary | ICD-10-CM | POA: Insufficient documentation

## 2015-04-26 DIAGNOSIS — I2510 Atherosclerotic heart disease of native coronary artery without angina pectoris: Secondary | ICD-10-CM | POA: Diagnosis not present

## 2015-04-26 DIAGNOSIS — I739 Peripheral vascular disease, unspecified: Secondary | ICD-10-CM | POA: Diagnosis not present

## 2015-04-26 DIAGNOSIS — N183 Chronic kidney disease, stage 3 (moderate): Secondary | ICD-10-CM | POA: Insufficient documentation

## 2015-04-26 DIAGNOSIS — Z833 Family history of diabetes mellitus: Secondary | ICD-10-CM | POA: Insufficient documentation

## 2015-04-26 DIAGNOSIS — Z8249 Family history of ischemic heart disease and other diseases of the circulatory system: Secondary | ICD-10-CM | POA: Insufficient documentation

## 2015-04-26 DIAGNOSIS — I25810 Atherosclerosis of coronary artery bypass graft(s) without angina pectoris: Secondary | ICD-10-CM | POA: Diagnosis not present

## 2015-04-26 DIAGNOSIS — Z951 Presence of aortocoronary bypass graft: Secondary | ICD-10-CM | POA: Insufficient documentation

## 2015-04-26 DIAGNOSIS — E1122 Type 2 diabetes mellitus with diabetic chronic kidney disease: Secondary | ICD-10-CM | POA: Insufficient documentation

## 2015-04-26 DIAGNOSIS — E669 Obesity, unspecified: Secondary | ICD-10-CM | POA: Diagnosis not present

## 2015-04-26 DIAGNOSIS — Z6832 Body mass index (BMI) 32.0-32.9, adult: Secondary | ICD-10-CM | POA: Insufficient documentation

## 2015-04-26 DIAGNOSIS — Z86010 Personal history of colonic polyps: Secondary | ICD-10-CM | POA: Diagnosis not present

## 2015-04-26 DIAGNOSIS — Z95810 Presence of automatic (implantable) cardiac defibrillator: Secondary | ICD-10-CM | POA: Diagnosis not present

## 2015-04-26 DIAGNOSIS — Z794 Long term (current) use of insulin: Secondary | ICD-10-CM | POA: Diagnosis not present

## 2015-04-26 DIAGNOSIS — Z79899 Other long term (current) drug therapy: Secondary | ICD-10-CM | POA: Diagnosis not present

## 2015-04-26 DIAGNOSIS — I5022 Chronic systolic (congestive) heart failure: Secondary | ICD-10-CM | POA: Diagnosis not present

## 2015-04-26 DIAGNOSIS — I255 Ischemic cardiomyopathy: Secondary | ICD-10-CM | POA: Diagnosis not present

## 2015-04-26 MED ORDER — LOSARTAN POTASSIUM 25 MG PO TABS: 25.0000 mg | ORAL_TABLET | Freq: Two times a day (BID) | ORAL | Status: DC

## 2015-04-26 NOTE — Progress Notes (Signed)
Patient ID: [name], female   DOB: 09/01/1955, 60 y.o.   MRN: [id number]   Advanced Heart Failure Clinic Note   Patient ID: [name], female   DOB: 1955-10-15, 60 y.o.   MRN: [id number] PCP: Dr. Trudee Kuster (Internal Medicine) Primary HF:  Dr. Haroldine Laws   HPI: 60 year old female with history of HTN, DM2, past polysubstance abuse (ETOH, tobacco, cocaine), CAD s/p CABG x5 with MV annuloplasty (2007), ischemic cardiomyopathy s/p Medtronic CRT-D, chronic systolic HF EF 86%, severe MR, CKD stage III and PAD. Blood type O+.  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 At time of cath LAD, LCX and RCA occluded. EF 45%. Had abrupt stent occlusion the next day and had to go back to the lab. 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   Amitted 8/2-10/07/13 for syncope s/p ICD shock. Concern that VT was possibly from ischemia and taken for [facility]. During cath patient had vagal response and BP dropped to 50s and co-ox was in the upper 40s. Placed on Levophed and transferred to floor. Started on Amiodarone.   On 11/25/13 underwent RHC for low output symptoms. Hemodynamic borderline. Swan left in and she was observed. Cardiac output dropped while in hospital so started on milrinone. Discharged home. Underwent CRT-D upgrade on 12/02/13  In 12/15, she was admitted with catheter infection. She was admitted and catheter was replaced.  She has completed antibiotic.    She has been titrated off milrinone. She has been off since January 2016.   She returns today for regular follow up. Has been doing well overall. Finished cardiac rehab. 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 M-F 12-6. Seen at [facility] transplant Clinic lasix/metolazone switched to Bumex '1mg'$  bid, Also saw Dr. Caryl Comes and amio cu to 100 bid. Echo this week EF 20-25%.Not listed for transplant yet as she has been doing well. Weight stable. No edema.    Optivol: Fluid well below threshold~4 hrs activity a day.  No VT/AF  05/19/2012  EF 15% 2/15 EF 15%, diffuse hypokinesis, restrictive diastolic function, s/p MV repair with moderate-severe MR.  4/15: EF 15%, severe MR, mod TR 2/17 EF 20-25%  CPX: 06/17/12 Peak VO2 14.8 (predicted peak VO2 68.5%), VE/VCO2 slope 43.4 OUES: 1.19, Peak RER 1.12 Vent threshold 11.3 (pred peak VO2 52.3%)  CPX (3/15): peak VO2 15.6 (predicted peak VO2 74.5%) VE/VO2 40.8, RER 1.2 CPX (6/16): peak VO2: 13.3 (68.6% predicted peak VO2) - corrects to 18.6 ml/kg/min for iBW   -VE/VCO2 slope: 33.7, OUES: 1.20, Peak RER: 1.12, VE/MVV: 48.4%,O2pulse: 11 (100% predicted O2pulse)  LHC (10/2013) 1) severe native CAD with all bypass grafts patent  11/01/13 RHC RA = 5  RV = 47/3/10  PA = 54/16 (30)  PCW = 14 v = 20  Fick cardiac output/index = 4.8/2.6  Thermo CO/CI = 3.7/2.0  PVR = 4.3 WU  FA sat = 95%  PA sat = 59%, 59%   Labs:  08/12/12 - on lipitor 40 mg daily Cholesterol 180 Triglyceride 195 HDL 29 LDL 112 K 4.5, creatinine 1.4 11/16/12 K 4.0 Labs 1.5 Pro BNP 181 02/14/13: K+ 4.6, Cr 1.33, Dig level 1.8, TSH 2.97 3/15: K 4.2, creatinine 1.07, HCT 32.4 06/2013: Dig level 0.3, pro-BNP 899, Cholesterol 140, TG 106, HDL 36, LDL 82, Cr 1.5, K+ 4.9 11/11/13: K 4.1 Creatinine 1.8  01/31/14 Creatinine 1.48 K .5  02/07/14:  K 4.2 Creatinine 1.75 Magnesium 2.2 after sprionolactone 12.5 mg  was added  12/15: K 3.8, creatinine 1.32  04/04/2014: K 4.8 Creatinine 1.59 06/08/2014: K 3.3 Creatinine 1.47  04/2015: K 4.5 Creatinine 1.65  FH: Mother deceased: CAD, DM2, HTN        Father deceased: stroke  SH: Works odds/end jobs for [employer]; disabled. Lives in [place] with 2 sons([name] and [name]).   ROS: All systems negative except as listed in HPI, PMH and Problem List.  Past Medical History  Diagnosis Date  . Coronary artery disease     a. s/p CABG x 5 with MV annuloplasty 2007   . Diabetes mellitus   . Hypertension    . Chronic systolic heart failure ([place])     a. ICM b. ECHO (06/2013): EF 15%, severe MR (06/2013) c. RHC (10/2013): RA 5, RV 23/2/5, PA 25/6 (14), PCWP 12, Fick CO/CI: 3.2/1.7, PVR 0.7 WU, PA 45%, 47% and 55% (with levophed 5), vagal response during cath. d. On home milrinone.  . Polysubstance abuse     history of  (cocaine, tobacco and ETOH)  . V-tach ([place])   . Ischemic cardiomyopathy     a. s/p ICD. b. LHC (10/04/13): 1. Severe native CAD with all bypass grafts patent. c. CRT upgrade 12/2013.  . Implantable defibrillator   medtronic   . LBBB (left bundle branch block)   . AICD (automatic cardioverter/defibrillator) present   . PICC line infection     a. 01/2014 - PICC exchanged.  . CKD (chronic kidney disease), stage III   . PAD (peripheral artery disease) ([place])   . Lipoma     Current Outpatient Prescriptions  Medication Sig Dispense Refill  . ACCU-CHEK FASTCLIX LANCETS MISC Use to test blood glucose 2 times daily. Dx:E11.9 102 each 12  . acetaminophen (TYLENOL) 325 MG tablet Take 650 mg by mouth every 6 (six) hours as needed for mild pain.    [name] allopurinol (ZYLOPRIM) 100 MG tablet TAKE ONE TO TWO TABLETS BY MOUTH TWICE DAILY. TAKE  '200MG'$   (2  TABLETS)  IN  THE  MORNING  AND  100  MG  (1  TABLET)  IN  THE  EVENING 90 tablet 0  . amiodarone (PACERONE) 100 MG tablet Take 1 tablet (100 mg total) by mouth daily. 30 tablet 11  . aspirin EC 81 MG tablet Take 81 mg by mouth every morning.     [name] atorvastatin (LIPITOR) 80 MG tablet Take 1 tablet (80 mg total) by mouth daily. 30 tablet 3  . Blood Glucose Monitoring Suppl (ACCU-CHEK NANO SMARTVIEW) W/DEVICE KIT Use to test blood glucose 2 times daily. Dx:E11.9 1 kit 0  . bumetanide (BUMEX) 1 MG tablet Take 1 mg by mouth 2 (two) times daily.    . diclofenac sodium (VOLTAREN) 1 % GEL Apply 4 g topically 4 (four) times daily. 100 g 0  . digoxin (LANOXIN) 0.125 MG tablet TAKE ONE-HALF TABLET BY MOUTH EVERY OTHER DAY 15 tablet 0  . glucose blood  (ACCU-CHEK SMARTVIEW) test strip Use to test blood glucose 2 times daily. Dx:E11.9 100 each 12  . Insulin Pen Needle 31G X 8 MM MISC 1 each by Does not apply route 3 (three) times daily. Inject insulin 3 times daily.  Insulin dependent DM E11.9. 100 each 11  . LANTUS SOLOSTAR 100 UNIT/ML Solostar Pen INJECT 30 UNITS INTO THE SKIN AT BEDTIME 15 pen 0  . losartan (COZAAR) 25 MG tablet TAKE ONE-HALF TABLET  BY MOUTH TWICE DAILY 30 tablet 4  . spironolactone (ALDACTONE) 25 MG tablet Take 25 mg by mouth daily.    . nitroGLYCERIN (NITROSTAT) 0.4 MG SL tablet Place 1 tablet (0.4 mg total) under the tongue every 5 (five) minutes as needed. For chest pain. (Patient not taking: Reported on 04/26/2015) 25 tablet 11  . [DISCONTINUED] sitaGLIPtin (JANUVIA) 25 MG tablet Take 1 tablet (25 mg total) by mouth daily. 30 tablet 1   No current facility-administered medications for this encounter.    Filed Vitals:   04/26/15 0914  BP: 110/64  Pulse: 64  Weight: 200 lb 4 oz (90.833 kg)  SpO2: 98%    PHYSICAL EXAM: General:  Well appearing. NAD HEENT: normal Neck: supple. JVP flat  Carotids 2+ bilaterally; no bruits. No thyromegaly appreciated. Cor: PMI normal. RRR. 2/6 SEM LSB 2/6 MR, no s3   Lungs: CTA bilaterally, normal effort Abdomen: obese, soft, NT, ND, no HSM. No bruits or masses. +BS Extremities: no cyanosis, clubbing, rash. no edema. Large scar medial portion of right calf. Neuro: alert & orientedx3, cranial nerves grossly intact. Moves all 4 extremities w/o difficulty. Affect pleasant.  ASSESSMENT & PLAN:  1. Chronic systolic CHF: Ischemic cardiomyopathy s/p Medtronic CRT-D, EF 15% (4/15).  She has been seen in the transplant clinic at [facility].  She is blood type O so matching for transplant may be difficult.  She may need LVAD prior to getting transplant, but continues to do very well off milrinone, may be 2/2 CRT upgrade.  NYHA class II-early III symptoms. CPX reassuring.  - Lasix recently switched to  bumex per [facility] transplant clinic. Will need BMET next week,  - SBP 110 will try to increase losartan to 25 bid and if tolerates can consider Entresto soon - Continue carvedilol 3.125 BID.  (already failed titration in 9/16) - Reinforced the need and importance of daily weights, a low sodium diet, and fluid restriction (less than 2 L a day). Instructed to call the HF clinic if weight increases more than 3 lbs overnight or 5 lbs in a week.  2. CAD: No chest pain.  Continue ASA and statin.  3. Mitral regurgitation: s/p mitral valve annuloplasty with CABG in 2007. Moderate to severe MR on echo in 4/15. TEE (06/2013) mitral regurgitation appeared severe, anatomy of repaired valve does not look suitable for MV clipping and would be high risk for MV replacement. Moderate MR on echo 2/17 4. CKD stage III: Stable last check. Recheck next week  5.  NSVT: Continue amiodarone 100 mg daily. Needs yearly eye exams, had one earlier this year. . 6. Obesity -BMI 32. Needs to keep weight down. If BMI > 35 will not be transplant candidate  7. PAD: No further claudication.  Right mid SFA stenosis per [place] 08/30/14. Medically management.  8. Colon polyps -- Had colonoscopy 06/08/14 with 12 polyps -->Plan for colonoscopy every 2 years.     Rola Lennon,MD 9:31 AM

## 2015-04-26 NOTE — Addendum Note (Signed)
Encounter addended by: Harvie Junior, CMA on: 04/26/2015  9:45 AM<BR>     Documentation filed: Dx Association, Patient Instructions Section, Orders

## 2015-04-26 NOTE — Patient Instructions (Signed)
Increase Losartan to '25mg'$  twice daily.  Labs: next week (bmet)  Follow up with Dr.Bensimhon in 2-14month

## 2015-04-28 ENCOUNTER — Other Ambulatory Visit (HOSPITAL_COMMUNITY): Payer: Self-pay | Admitting: Internal Medicine

## 2015-05-02 ENCOUNTER — Other Ambulatory Visit: Payer: Self-pay | Admitting: Internal Medicine

## 2015-05-04 ENCOUNTER — Ambulatory Visit (HOSPITAL_COMMUNITY)
Admission: RE | Admit: 2015-05-04 | Discharge: 2015-05-04 | Disposition: A | Discharge status: Home / Self Care | Payer: Medicare Other | Source: Ambulatory Visit | Attending: Cardiology | Admitting: Cardiology

## 2015-05-04 DIAGNOSIS — I5022 Chronic systolic (congestive) heart failure: Secondary | ICD-10-CM | POA: Insufficient documentation

## 2015-05-04 LAB — BASIC METABOLIC PANEL
ANION GAP: 12 (ref 5–15)
BUN: 28 mg/dL — AB (ref 6–20)
CALCIUM: 9.7 mg/dL (ref 8.9–10.3)
CO2: 25 mmol/L (ref 22–32)
Chloride: 102 mmol/L (ref 101–111)
Creatinine, Ser: 1.45 mg/dL — ABNORMAL HIGH (ref 0.44–1.00)
GFR calc Af Amer: 45 mL/min — ABNORMAL LOW (ref >60)
GFR, EST NON AFRICAN AMERICAN: 39 mL/min — AB (ref >60)
GLUCOSE: 177 mg/dL — AB (ref 65–99)
Potassium: 4.3 mmol/L (ref 3.5–5.1)
Sodium: 139 mmol/L (ref 135–145)

## 2015-05-04 LAB — BRAIN NATRIURETIC PEPTIDE: B Natriuretic Peptide: 181.2 pg/mL — ABNORMAL HIGH (ref 0.0–100.0)

## 2015-05-04 MED ORDER — BUMETANIDE 1 MG PO TABS: ORAL_TABLET | ORAL | Status: DC

## 2015-05-04 MED ORDER — LOSARTAN POTASSIUM 25 MG PO TABS: 25.0000 mg | ORAL_TABLET | Freq: Two times a day (BID) | ORAL | Status: DC

## 2015-05-04 NOTE — Progress Notes (Signed)
Pt in for lab work today and ask to speak with me regarding her Bumex.  She states since [facility] changed her from Lasix to Bumex she feels she is retaining fluid and it doesn't seem to be working for her.  SHe states she doesn't feel any worse than she did her appt last week.  On our scale pt's wt today was 200.8 and last week she was 200.4 so wt has not changed.  Did Optivol on pt which showed her fluid has been elevating since mid Feb and she crossed the threshold around 2/27. Discussed all with Oda Kilts, PA and Doroteo Bradford, pharmD we will have pt increase her Bumex to 2 mg in AM and 1 mg in PM and see Dr Haroldine Laws next week for f/u, she is aware and agreeable to plan, appt sch for 3/10

## 2015-05-04 NOTE — Addendum Note (Signed)
Encounter addended by: Kerry Dory, CMA on: 05/04/2015 10:20 AM<BR>     Documentation filed: Dx Association, Orders

## 2015-05-04 NOTE — Patient Instructions (Signed)
Increase Bumex to 2 mg (2 tabs) in AM and 1 mg (1 tab) in PM  Your physician recommends that you schedule a follow-up appointment in: 1 week

## 2015-05-04 NOTE — Addendum Note (Signed)
Encounter addended by: Scarlette Calico, RN on: 05/04/2015 10:23 AM<BR>     Documentation filed: Notes Section, Patient Instructions Section, Orders

## 2015-05-07 ENCOUNTER — Other Ambulatory Visit: Payer: Self-pay | Admitting: Internal Medicine

## 2015-05-11 ENCOUNTER — Encounter (HOSPITAL_COMMUNITY): Payer: Self-pay | Admitting: Internal Medicine

## 2015-05-11 ENCOUNTER — Ambulatory Visit (HOSPITAL_COMMUNITY)
Admission: RE | Admit: 2015-05-11 | Discharge: 2015-05-11 | Disposition: A | Discharge status: Home / Self Care | Payer: Medicare Other | Source: Ambulatory Visit | Attending: Internal Medicine | Admitting: Internal Medicine

## 2015-05-11 VITALS — BP 102/68 | HR 64 | Wt 199.0 lb

## 2015-05-11 DIAGNOSIS — E1122 Type 2 diabetes mellitus with diabetic chronic kidney disease: Secondary | ICD-10-CM | POA: Diagnosis not present

## 2015-05-11 DIAGNOSIS — Z79899 Other long term (current) drug therapy: Secondary | ICD-10-CM | POA: Diagnosis not present

## 2015-05-11 DIAGNOSIS — Z95810 Presence of automatic (implantable) cardiac defibrillator: Secondary | ICD-10-CM | POA: Insufficient documentation

## 2015-05-11 DIAGNOSIS — I2510 Atherosclerotic heart disease of native coronary artery without angina pectoris: Secondary | ICD-10-CM | POA: Insufficient documentation

## 2015-05-11 DIAGNOSIS — Z6832 Body mass index (BMI) 32.0-32.9, adult: Secondary | ICD-10-CM | POA: Insufficient documentation

## 2015-05-11 DIAGNOSIS — Z794 Long term (current) use of insulin: Secondary | ICD-10-CM | POA: Insufficient documentation

## 2015-05-11 DIAGNOSIS — I255 Ischemic cardiomyopathy: Secondary | ICD-10-CM | POA: Diagnosis not present

## 2015-05-11 DIAGNOSIS — Z833 Family history of diabetes mellitus: Secondary | ICD-10-CM | POA: Diagnosis not present

## 2015-05-11 DIAGNOSIS — Z8249 Family history of ischemic heart disease and other diseases of the circulatory system: Secondary | ICD-10-CM | POA: Insufficient documentation

## 2015-05-11 DIAGNOSIS — Z823 Family history of stroke: Secondary | ICD-10-CM | POA: Insufficient documentation

## 2015-05-11 DIAGNOSIS — I130 Hypertensive heart and chronic kidney disease with heart failure and stage 1 through stage 4 chronic kidney disease, or unspecified chronic kidney disease: Secondary | ICD-10-CM | POA: Diagnosis not present

## 2015-05-11 DIAGNOSIS — I5022 Chronic systolic (congestive) heart failure: Secondary | ICD-10-CM | POA: Diagnosis not present

## 2015-05-11 DIAGNOSIS — E669 Obesity, unspecified: Secondary | ICD-10-CM | POA: Insufficient documentation

## 2015-05-11 DIAGNOSIS — I739 Peripheral vascular disease, unspecified: Secondary | ICD-10-CM | POA: Diagnosis not present

## 2015-05-11 DIAGNOSIS — N183 Chronic kidney disease, stage 3 (moderate): Secondary | ICD-10-CM | POA: Diagnosis not present

## 2015-05-11 DIAGNOSIS — Z7982 Long term (current) use of aspirin: Secondary | ICD-10-CM | POA: Insufficient documentation

## 2015-05-11 DIAGNOSIS — Z86010 Personal history of colonic polyps: Secondary | ICD-10-CM | POA: Insufficient documentation

## 2015-05-11 DIAGNOSIS — Z951 Presence of aortocoronary bypass graft: Secondary | ICD-10-CM | POA: Insufficient documentation

## 2015-05-11 MED ORDER — METOLAZONE 2.5 MG PO TABS: 2.5000 mg | ORAL_TABLET | ORAL | Status: DC

## 2015-05-11 MED ORDER — CARVEDILOL 6.25 MG PO TABS: 3.1250 mg | ORAL_TABLET | Freq: Every evening | ORAL | Status: DC

## 2015-05-11 MED ORDER — FUROSEMIDE 80 MG PO TABS: 80.0000 mg | ORAL_TABLET | Freq: Two times a day (BID) | ORAL | Status: DC

## 2015-05-11 MED ORDER — POTASSIUM CHLORIDE CRYS ER 20 MEQ PO TBCR: 20.0000 meq | EXTENDED_RELEASE_TABLET | ORAL | Status: DC

## 2015-05-11 NOTE — Progress Notes (Signed)
Patient ID: [name], female   DOB: 01/10/1956, 60 y.o.   MRN: [id number]   Advanced Heart Failure Clinic Note   Patient ID: [name], female   DOB: 14-Jun-1955, 60 y.o.   MRN: [id number] PCP: Dr. Trudee Kuster (Internal Medicine) Primary HF:  Dr. Haroldine Laws   HPI: 60 year old female with history of HTN, DM2, past polysubstance abuse (ETOH, tobacco, cocaine), CAD s/p CABG x5 with MV annuloplasty (2007), ischemic cardiomyopathy s/p Medtronic CRT-D, chronic systolic HF EF 84%, severe MR, CKD stage III and PAD. Blood type O+.  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 At time of cath LAD, LCX and RCA occluded. EF 45%. Had abrupt stent occlusion the next day and had to go back to the lab. 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   Amitted 8/2-10/07/13 for syncope s/p ICD shock. Concern that VT was possibly from ischemia and taken for [facility]. During cath patient had vagal response and BP dropped to 50s and co-ox was in the upper 40s. Placed on Levophed and transferred to floor. Started on Amiodarone.   On 11/25/13 underwent RHC for low output symptoms. Hemodynamic borderline. Swan left in and she was observed. Cardiac output dropped while in hospital so started on milrinone. Discharged home. Underwent CRT-D upgrade on 12/02/13  In 12/15, she was admitted with catheter infection. She was admitted and catheter was replaced.  She has completed antibiotic.    She has been titrated off milrinone. She has been off since January 2016.   She returns today for regular follow up. We saw her recently and increased losartan to 25 bd. Tolerating well.  Seen at [facility] transplant [place] and lasix/metolazone switched to Bumex '1mg'$  bid. Subsequently increased to bumex '2mg'$ /'1mg'$ . Still swollen. Now 7 pounds up. Denies DOE, orthopnea or PND. Was on lasix 80 bid and metolazone every Saturday.   Optivol: Fluid over threshold~4  hrs activity a day.  No VT/AF  05/19/2012  EF 15% 2/15 EF 15%, diffuse hypokinesis, restrictive diastolic function, s/p MV repair with moderate-severe MR.  4/15: EF 15%, severe MR, mod TR 2/17 EF 20-25%  CPX: 06/17/12 Peak VO2 14.8 (predicted peak VO2 68.5%), VE/VCO2 slope 43.4 OUES: 1.19, Peak RER 1.12 Vent threshold 11.3 (pred peak VO2 52.3%)  CPX (3/15): peak VO2 15.6 (predicted peak VO2 74.5%) VE/VO2 40.8, RER 1.2 CPX (6/16): peak VO2: 13.3 (68.6% predicted peak VO2) - corrects to 18.6 ml/kg/min for iBW   -VE/VCO2 slope: 33.7, OUES: 1.20, Peak RER: 1.12, VE/MVV: 48.4%,O2pulse: 11 (100% predicted O2pulse)  LHC (10/2013) 1) severe native CAD with all bypass grafts patent  11/01/13 RHC RA = 5  RV = 47/3/10  PA = 54/16 (30)  PCW = 14 v = 20  Fick cardiac output/index = 4.8/2.6  Thermo CO/CI = 3.7/2.0  PVR = 4.3 WU  FA sat = 95%  PA sat = 59%, 59%   Labs:  08/12/12 - on lipitor 40 mg daily Cholesterol 180 Triglyceride 195 HDL 29 LDL 112 K 4.5, creatinine 1.4 11/16/12 K 4.0 Labs 1.5 Pro BNP 181 02/14/13: K+ 4.6, Cr 1.33, Dig level 1.8, TSH 2.97 3/15: K 4.2, creatinine 1.07, HCT 32.4 06/2013: Dig level 0.3, pro-BNP 899, Cholesterol 140, TG 106, HDL 36, LDL 82, Cr 1.5, K+ 4.9 11/11/13: K 4.1 Creatinine 1.8  01/31/14 Creatinine 1.48 K .5  02/07/14:  K 4.2 Creatinine 1.75 Magnesium 2.2 after  sprionolactone 12.5 mg  was added  12/15: K 3.8, creatinine 1.32  04/04/2014: K 4.8 Creatinine 1.59 06/08/2014: K 3.3 Creatinine 1.47  04/2015: K 4.5 Creatinine 1.65 05/04/15: K 4.3 creatinine 1.45  FH: Mother deceased: CAD, DM2, HTN        Father deceased: stroke  SH: Works odds/end jobs for [employer]; disabled. Lives in [place] with 2 sons([name] and [name]).   ROS: All systems negative except as listed in HPI, PMH and Problem List.  Past Medical History  Diagnosis Date  . Coronary artery disease     a. s/p CABG x 5 with MV annuloplasty 2007   . Diabetes mellitus   . Hypertension   .  Chronic systolic heart failure ([place])     a. ICM b. ECHO (06/2013): EF 15%, severe MR (06/2013) c. RHC (10/2013): RA 5, RV 23/2/5, PA 25/6 (14), PCWP 12, Fick CO/CI: 3.2/1.7, PVR 0.7 WU, PA 45%, 47% and 55% (with levophed 5), vagal response during cath. d. On home milrinone.  . Polysubstance abuse     history of  (cocaine, tobacco and ETOH)  . V-tach ([place])   . Ischemic cardiomyopathy     a. s/p ICD. b. LHC (10/04/13): 1. Severe native CAD with all bypass grafts patent. c. CRT upgrade 12/2013.  . Implantable defibrillator   medtronic   . LBBB (left bundle branch block)   . AICD (automatic cardioverter/defibrillator) present   . PICC line infection     a. 01/2014 - PICC exchanged.  . CKD (chronic kidney disease), stage III   . PAD (peripheral artery disease) ([place])   . Lipoma     Current Outpatient Prescriptions  Medication Sig Dispense Refill  . ACCU-CHEK FASTCLIX LANCETS MISC Use to test blood glucose 2 times daily. Dx:E11.9 102 each 12  . acetaminophen (TYLENOL) 325 MG tablet Take 650 mg by mouth every 6 (six) hours as needed for mild pain.    [name] allopurinol (ZYLOPRIM) 100 MG tablet Take 200 mg by mouth at bedtime.    [name] amiodarone (PACERONE) 200 MG tablet Take 100 mg by mouth daily.    [name] aspirin EC 81 MG tablet Take 81 mg by mouth every morning.     [name] atorvastatin (LIPITOR) 80 MG tablet Take 1 tablet (80 mg total) by mouth daily. 30 tablet 3  . B-D ULTRAFINE III SHORT PEN 31G X 8 MM MISC USE TO INJECT INSULIN 3 TIMES DAILY 100 each 0  . Blood Glucose Monitoring Suppl (ACCU-CHEK NANO SMARTVIEW) W/DEVICE KIT Use to test blood glucose 2 times daily. Dx:E11.9 1 kit 0  . bumetanide (BUMEX) 1 MG tablet Take 2 tabs in AM and 1 tab in PM    . digoxin (LANOXIN) 0.125 MG tablet TAKE ONE-HALF TABLET BY MOUTH EVERY OTHER DAY 15 tablet 0  . glucose blood (ACCU-CHEK SMARTVIEW) test strip Use to test blood glucose 2 times daily. Dx:E11.9 100 each 12  . LANTUS SOLOSTAR 100 UNIT/ML Solostar Pen INJECT 30 UNITS  INTO THE SKIN AT BEDTIME. 15 pen 3  . losartan (COZAAR) 25 MG tablet Take 1 tablet (25 mg total) by mouth 2 (two) times daily. 60 tablet 4  . nitroGLYCERIN (NITROSTAT) 0.4 MG SL tablet Place 1 tablet (0.4 mg total) under the tongue every 5 (five) minutes as needed. For chest pain. 25 tablet 11  . spironolactone (ALDACTONE) 25 MG tablet Take 25 mg by mouth daily.    . diclofenac sodium (VOLTAREN) 1 % GEL Apply 4 g topically 4 (four)  times daily. (Patient not taking: Reported on 05/11/2015) 100 g 0  . [DISCONTINUED] sitaGLIPtin (JANUVIA) 25 MG tablet Take 1 tablet (25 mg total) by mouth daily. 30 tablet 1   No current facility-administered medications for this encounter.    Filed Vitals:   05/11/15 0941  BP: 102/68  Pulse: 64  Weight: 199 lb (90.266 kg)  SpO2: 98%    PHYSICAL EXAM: General:  Well appearing. NAD HEENT: normal Neck: supple. JVP 7-8  Carotids 2+ bilaterally; no bruits. No thyromegaly appreciated. Cor: PMI normal. RRR. 2/6 SEM LSB 2/6 MR, no s3   Lungs: CTA bilaterally, normal effort Abdomen: obese, soft, NT, +distended, no HSM. No bruits or masses. +BS Extremities: no cyanosis, clubbing, rash. no edema. Fasciotomy scar RLE Neuro: alert & orientedx3, cranial nerves grossly intact. Moves all 4 extremities w/o difficulty. Affect pleasant.  ASSESSMENT & PLAN:  1. Chronic systolic CHF: Ischemic cardiomyopathy s/p Medtronic CRT-D, EF 15% (4/15).  She has been seen in the transplant clinic at [facility].  She is blood type O so matching for transplant may be difficult.  She may need LVAD prior to getting transplant, but continues to do very well off milrinone, may be 2/2 CRT upgrade.  NYHA class II-early III symptoms. CPX reassuring.  - Volume status up on exam and by Optivol. Will switch back to lasix 80 bid and metolazone 2.5 on Saturday and as needed. Take metolazone today and tomorrow. Call by Monday if not back to baseline - Continue losartan 25 bid - too low to consider Entresto  now - Continue carvedilol 3.125 BID.  (already failed titration in 9/16) - Reinforced the need and importance of daily weights, a low sodium diet, and fluid restriction (less than 2 L a day). Instructed to call the HF clinic if weight increases more than 3 lbs overnight or 5 lbs in a week.  2. CAD: No chest pain.  Continue ASA and statin.  3. Mitral regurgitation: s/p mitral valve annuloplasty with CABG in 2007. Moderate to severe MR on echo in 4/15. TEE (06/2013) mitral regurgitation appeared severe, anatomy of repaired valve does not look suitable for MV clipping and would be high risk for MV replacement. Moderate MR on echo 2/17 4. CKD stage III: Stable last check. Recheck next week  5.  NSVT: Continue amiodarone 100 mg daily. Needs yearly eye exams, had one earlier this year.  6. Obesity -BMI 32. Needs to keep weight down. If BMI > 35 will not be transplant candidate  7. PAD: No further claudication.  Right mid SFA stenosis per [place] 08/30/14. Medically management.  8. Colon polyps -- Had colonoscopy 06/08/14 with 12 polyps -->Plan for colonoscopy every 2 years.     Emmelina Mcloughlin,MD 10:09 AM

## 2015-05-11 NOTE — Progress Notes (Signed)
Advanced Heart Failure Medication Review by a Pharmacist  Does the patient  feel that his/her medications are working for him/her?  yes  Has the patient been experiencing any side effects to the medications prescribed?  no  Does the patient measure his/her own blood pressure or blood glucose at home?  yes   Does the patient have any problems obtaining medications due to transportation or finances?   no  Understanding of regimen: good Understanding of indications: good Potential of compliance: good Patient understands to avoid NSAIDs. Patient understands to avoid decongestants.  Issues to address at subsequent visits: None   Pharmacist comments: 60 YO pleasant female presenting for HF follow-up. Pt reports no SE with current regimen and not barriers to obtaining medications.  Pt reports that her amiodarone was decreased and she is taking 1/2 of 200 mg tablet daily and will switch to 100 mg tabs once her supply of 200 mg runs out.  Pt denies currently taking Coreg and states that it was stopped to to her not being able to tolerate it at a previous visit.  Per last HF clinic note pt was supposed to continue Coreg.    Time with patient: 5 min  Preparation and documentation time: 5 min  Total time: 10 min

## 2015-05-11 NOTE — Patient Instructions (Addendum)
START Lasix 80 mg tab twice daily.  START Metolazone 2.5 mg tab and Potassium 20 mg tab once every Saturday. Take also TODAY.  START Carvedilol (Coreg) 3.125 mg (1/2 tab) once in the afternoons.  Return Monday for labs.  Follow up 2 months with Dr. Haroldine Laws.  Do the following things EVERYDAY: 1) Weigh yourself in the morning before breakfast. Write it down and keep it in a log. 2) Take your medicines as prescribed 3) Eat low salt foods-Limit salt (sodium) to 2000 mg per day.  4) Stay as active as you can everyday 5) Limit all fluids for the day to less than 2 liters

## 2015-05-11 NOTE — Addendum Note (Signed)
Encounter addended by: Kem Parkinson, [place] on: 05/11/2015 11:28 AM<BR>     Documentation filed: Notes Section

## 2015-05-11 NOTE — Addendum Note (Signed)
Encounter addended by: Effie Berkshire, RN on: 05/11/2015 10:31 AM<BR>     Documentation filed: Dx Association, Patient Instructions Section, Orders

## 2015-05-14 ENCOUNTER — Ambulatory Visit (HOSPITAL_COMMUNITY)
Admission: RE | Admit: 2015-05-14 | Discharge: 2015-05-14 | Disposition: A | Discharge status: Home / Self Care | Payer: Medicare Other | Source: Ambulatory Visit | Attending: Cardiology | Admitting: Cardiology

## 2015-05-14 DIAGNOSIS — I5022 Chronic systolic (congestive) heart failure: Secondary | ICD-10-CM | POA: Diagnosis not present

## 2015-05-14 LAB — BASIC METABOLIC PANEL
Anion gap: 16 — ABNORMAL HIGH (ref 5–15)
BUN: 72 mg/dL — AB (ref 6–20)
CALCIUM: 9.9 mg/dL (ref 8.9–10.3)
CHLORIDE: 93 mmol/L — AB (ref 101–111)
CO2: 23 mmol/L (ref 22–32)
CREATININE: 3.1 mg/dL — AB (ref 0.44–1.00)
GFR calc Af Amer: 18 mL/min — ABNORMAL LOW (ref >60)
GFR calc non Af Amer: 16 mL/min — ABNORMAL LOW (ref >60)
GLUCOSE: 198 mg/dL — AB (ref 65–99)
Potassium: 3.7 mmol/L (ref 3.5–5.1)
Sodium: 132 mmol/L — ABNORMAL LOW (ref 135–145)

## 2015-05-14 MED ORDER — CARVEDILOL 6.25 MG PO TABS: 3.1250 mg | ORAL_TABLET | Freq: Every evening | ORAL | Status: DC

## 2015-05-14 NOTE — Addendum Note (Signed)
Encounter addended by: Kerry Dory, CMA on: 05/14/2015  3:14 PM<BR>     Documentation filed: Orders

## 2015-05-14 NOTE — Addendum Note (Signed)
Encounter addended by: Kerry Dory, CMA on: 05/14/2015 10:34 AM<BR>     Documentation filed: Dx Association, Orders

## 2015-05-16 ENCOUNTER — Other Ambulatory Visit (HOSPITAL_COMMUNITY): Payer: Self-pay | Admitting: *Deleted

## 2015-05-16 ENCOUNTER — Telehealth (HOSPITAL_COMMUNITY): Payer: Self-pay | Admitting: *Deleted

## 2015-05-16 DIAGNOSIS — I5022 Chronic systolic (congestive) heart failure: Secondary | ICD-10-CM

## 2015-05-16 MED ORDER — LOSARTAN POTASSIUM 25 MG PO TABS: 12.5000 mg | ORAL_TABLET | Freq: Two times a day (BID) | ORAL | Status: DC

## 2015-05-16 NOTE — Telephone Encounter (Signed)
Notes Recorded by Scarlette Calico, RN on 05/16/2015 at 4:57 PM Finally reach pt today, she states she did not receive the messages and has been taking Lasix, she will hold this evenings dose and tomorrows doses, repeat labs sch 3/20

## 2015-05-16 NOTE — Telephone Encounter (Signed)
-----   Message from Scarlette Calico, RN sent at 05/14/2015  5:27 PM EDT ----- Discussed w/Dr Haroldine Laws, he would like pt to stop metolazone and hold lasix for 1 day, repeat labs Wed or Thur, attempted to reach pt and LVM to hold meds and call us in AM

## 2015-05-18 ENCOUNTER — Encounter: Payer: Self-pay | Admitting: Internal Medicine

## 2015-05-21 ENCOUNTER — Ambulatory Visit (HOSPITAL_COMMUNITY)
Admission: RE | Admit: 2015-05-21 | Discharge: 2015-05-21 | Disposition: A | Discharge status: Home / Self Care | Payer: Medicare Other | Source: Ambulatory Visit | Attending: Cardiology | Admitting: Cardiology

## 2015-05-21 ENCOUNTER — Encounter: Payer: Self-pay | Admitting: Internal Medicine

## 2015-05-21 DIAGNOSIS — I5022 Chronic systolic (congestive) heart failure: Secondary | ICD-10-CM | POA: Diagnosis not present

## 2015-05-21 LAB — BASIC METABOLIC PANEL
ANION GAP: 11 (ref 5–15)
BUN: 40 mg/dL — AB (ref 6–20)
CO2: 26 mmol/L (ref 22–32)
Calcium: 9.5 mg/dL (ref 8.9–10.3)
Chloride: 102 mmol/L (ref 101–111)
Creatinine, Ser: 1.64 mg/dL — ABNORMAL HIGH (ref 0.44–1.00)
GFR calc Af Amer: 39 mL/min — ABNORMAL LOW (ref >60)
GFR calc non Af Amer: 34 mL/min — ABNORMAL LOW (ref >60)
GLUCOSE: 155 mg/dL — AB (ref 65–99)
POTASSIUM: 4.5 mmol/L (ref 3.5–5.1)
Sodium: 139 mmol/L (ref 135–145)

## 2015-05-22 ENCOUNTER — Encounter: Payer: Self-pay | Admitting: Internal Medicine

## 2015-05-24 ENCOUNTER — Other Ambulatory Visit (HOSPITAL_COMMUNITY): Payer: Self-pay | Admitting: Internal Medicine

## 2015-05-30 IMAGING — XA IR REMOVAL TUNNELED CV CATH
1 series · 2 of 2 positions shown · non-contrast
Comparison: none

CLINICAL DATA: 58-year-old female with cardiac arrhythmia and
cardiomyopathy. A tunneled right IJ PICC was placed by
interventional Radiology on 01/25/2014 for outpatient Milrinone the
infusion.

[Series 1: run · 2 of 2 slices shown]
[im 1/2]
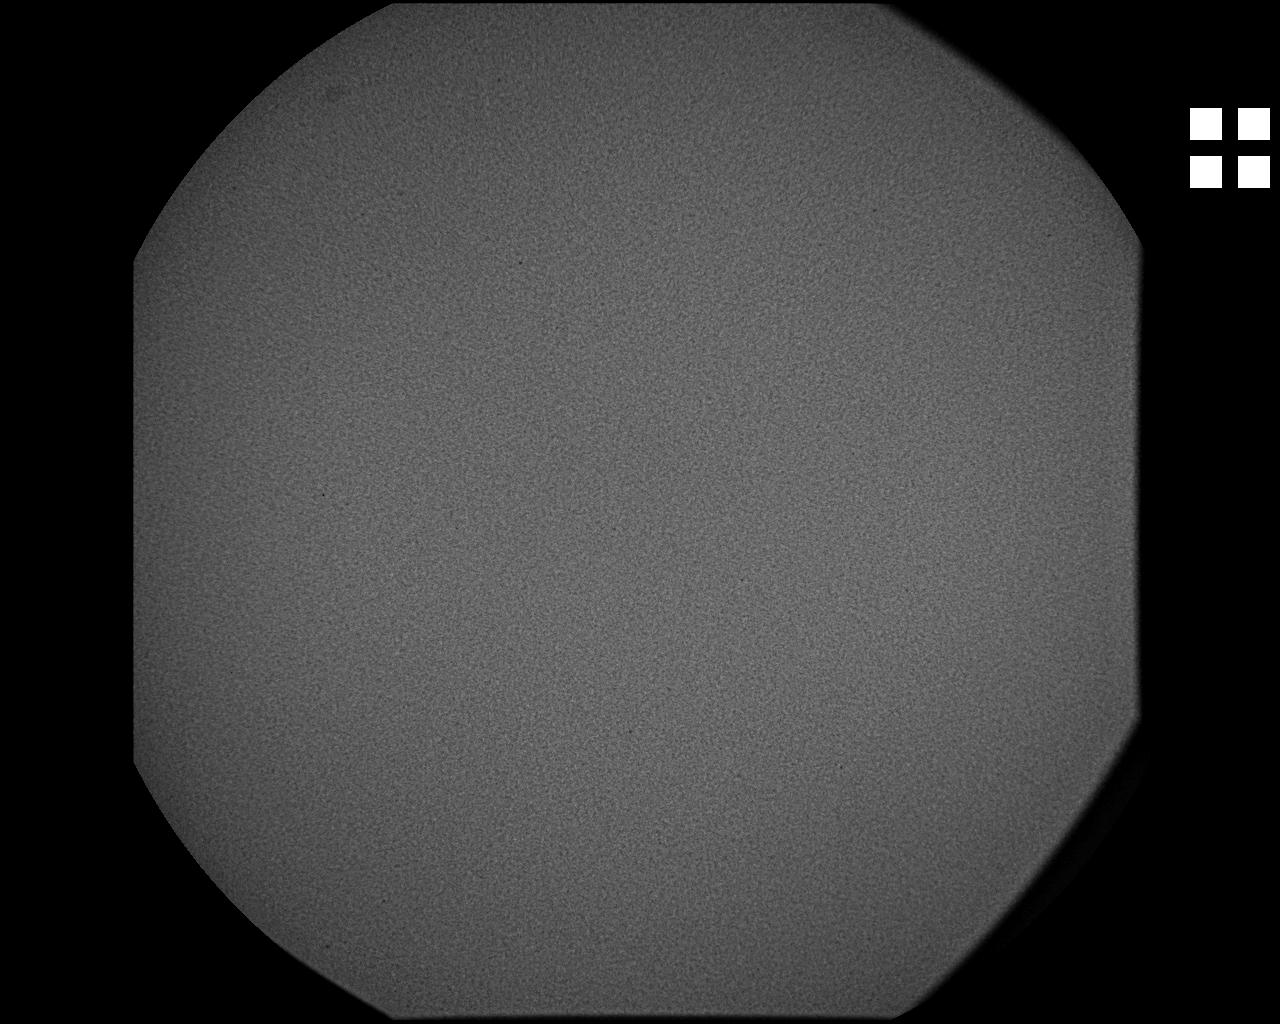
[im 2/2]
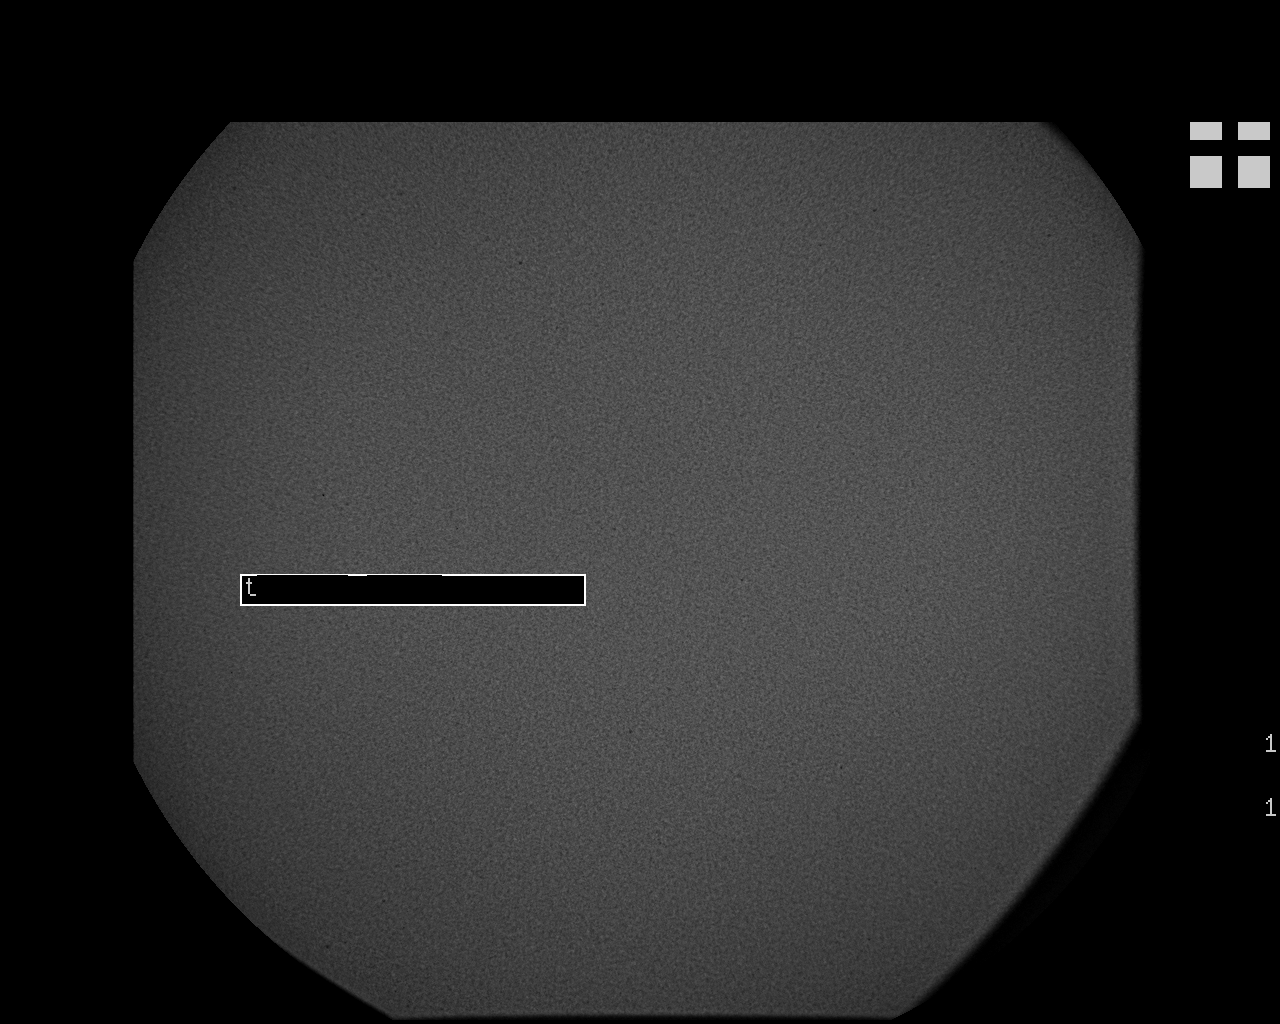

[2 of 2 positions shown; findings below may reference images not displayed]

The patient did well until last [REDACTED] when her home health nurse
noted some erythema about the catheter exit site. Over the past
week, the erythema has gotten slightly worse and there is now some
mild drainage from the catheter exit site. This is accompanied by
subjective fever and chills. The patient has no leukocytosis or
fever. There is a mild erythema about the skin exit site as well as
a small volume of milky drainage. No fluctuance along the tunnel.

EXAM:
REMOVAL OF TUNNELED CENTRAL VENOUS CATHETER

PROCEDURE:
The right chest tunneled PICC catheter site was prepped with
chlorhexidine. A sterile gown and gloves were worn during the
procedure.

The subcutaneous cuff was freed with gentle manual traction. The
catheter was then successfully removed in its entirety. The tip was
cut and sent for culture. Routine wound culture specimens were then
obtained from the catheter exit site and proximal subcutaneous
tunnel. A sterile dressing was applied over the catheter exit site.

COMPLICATIONS:
None
IMPRESSION: Removal of tunneled PICC for possible superficial skin infection at
the catheter entry site. Both the catheter tip and the skin entry
site and proximal tunnel were cultured.

## 2015-06-10 ENCOUNTER — Other Ambulatory Visit (HOSPITAL_COMMUNITY): Payer: Self-pay | Admitting: Internal Medicine

## 2015-06-13 ENCOUNTER — Other Ambulatory Visit (HOSPITAL_COMMUNITY): Payer: Self-pay | Admitting: Internal Medicine

## 2015-06-13 MED ORDER — SPIRONOLACTONE 25 MG PO TABS: 25.0000 mg | ORAL_TABLET | Freq: Every day | ORAL | Status: DC

## 2015-06-25 ENCOUNTER — Telehealth: Payer: Self-pay

## 2015-06-25 ENCOUNTER — Other Ambulatory Visit (HOSPITAL_COMMUNITY): Payer: Self-pay | Admitting: *Deleted

## 2015-06-25 ENCOUNTER — Telehealth (HOSPITAL_COMMUNITY): Payer: Self-pay | Admitting: Vascular Surgery

## 2015-06-25 MED ORDER — POTASSIUM CHLORIDE CRYS ER 20 MEQ PO TBCR: EXTENDED_RELEASE_TABLET | ORAL | Status: DC

## 2015-06-25 NOTE — Telephone Encounter (Signed)
Pt wants to speak with a nurse.

## 2015-06-25 NOTE — Telephone Encounter (Signed)
Refill klorcon

## 2015-06-26 NOTE — Telephone Encounter (Signed)
Pt has spoken to someone and will be in clinic in the morning- no additional needs.  Closing encounter

## 2015-06-27 ENCOUNTER — Ambulatory Visit (INDEPENDENT_AMBULATORY_CARE_PROVIDER_SITE_OTHER): Payer: Medicare Other | Admitting: Internal Medicine

## 2015-06-27 ENCOUNTER — Encounter: Payer: Self-pay | Admitting: Internal Medicine

## 2015-06-27 VITALS — BP 104/61 | HR 55 | Temp 97.7°F | Ht 64.0 in | Wt 199.5 lb

## 2015-06-27 DIAGNOSIS — E118 Type 2 diabetes mellitus with unspecified complications: Secondary | ICD-10-CM | POA: Diagnosis not present

## 2015-06-27 DIAGNOSIS — I255 Ischemic cardiomyopathy: Secondary | ICD-10-CM | POA: Diagnosis not present

## 2015-06-27 DIAGNOSIS — K7460 Unspecified cirrhosis of liver: Secondary | ICD-10-CM | POA: Diagnosis not present

## 2015-06-27 DIAGNOSIS — Z23 Encounter for immunization: Secondary | ICD-10-CM | POA: Diagnosis not present

## 2015-06-27 LAB — POCT GLYCOSYLATED HEMOGLOBIN (HGB A1C): Hemoglobin A1C: 7.8

## 2015-06-27 LAB — GLUCOSE, CAPILLARY: Glucose-Capillary: 151 mg/dL — ABNORMAL HIGH (ref 65–99)

## 2015-06-27 MED ORDER — SITAGLIPTIN PHOSPHATE 100 MG PO TABS: 100.0000 mg | ORAL_TABLET | Freq: Every day | ORAL | Status: DC

## 2015-06-27 NOTE — Addendum Note (Signed)
Addended by: Samara Deist on: 06/27/2015 09:42 AM   Modules accepted: Orders

## 2015-06-27 NOTE — Progress Notes (Signed)
Subjective:   Patient ID: [name] female   DOB: Apr 01, 1955 60 y.o.   MRN: [id number]  HPI: [name] is a 60 y.o. with past medical history as outlined below who presents to clinic for diabetes follow up.   Please see problem list for status of the pt's chronic medical problems.  Past Medical History  Diagnosis Date  . Coronary artery disease     a. s/p CABG x 5 with MV annuloplasty 2007   . Diabetes mellitus   . Hypertension   . Chronic systolic heart failure ([place])     a. ICM b. ECHO (06/2013): EF 15%, severe MR (06/2013) c. RHC (10/2013): RA 5, RV 23/2/5, PA 25/6 (14), PCWP 12, Fick CO/CI: 3.2/1.7, PVR 0.7 WU, PA 45%, 47% and 55% (with levophed 5), vagal response during cath. d. On home milrinone.  . Polysubstance abuse     history of  (cocaine, tobacco and ETOH)  . V-tach ([place])   . Ischemic cardiomyopathy     a. s/p ICD. b. LHC (10/04/13): 1. Severe native CAD with all bypass grafts patent. c. CRT upgrade 12/2013.  . Implantable defibrillator   medtronic   . LBBB (left bundle branch block)   . AICD (automatic cardioverter/defibrillator) present   . PICC line infection     a. 01/2014 - PICC exchanged.  . CKD (chronic kidney disease), stage III   . PAD (peripheral artery disease) ([place])   . Lipoma    Current Outpatient Prescriptions  Medication Sig Dispense Refill  . ACCU-CHEK FASTCLIX LANCETS MISC Use to test blood glucose 2 times daily. Dx:E11.9 102 each 12  . acetaminophen (TYLENOL) 325 MG tablet Take 650 mg by mouth every 6 (six) hours as needed for mild pain.    [name] allopurinol (ZYLOPRIM) 100 MG tablet Take 200 mg by mouth at bedtime.    [name] allopurinol (ZYLOPRIM) 100 MG tablet TAKE ONE TO TWO TABLETS BY MOUTH TWICE DAILY TAKE  200  MG  (2  TABLETS)  IN  THE  MORNING  AND  100  MG  (1  TABLET)  IN  THE  EVENING 90 tablet 0  . amiodarone (PACERONE) 200 MG tablet Take 100 mg by mouth daily.    [name] aspirin EC 81 MG tablet Take 81 mg by mouth every morning.     [name]  atorvastatin (LIPITOR) 80 MG tablet Take 1 tablet (80 mg total) by mouth daily. 30 tablet 3  . atorvastatin (LIPITOR) 80 MG tablet TAKE ONE TABLET BY MOUTH ONCE DAILY 30 tablet 0  . B-D ULTRAFINE III SHORT PEN 31G X 8 MM MISC USE TO INJECT INSULIN 3 TIMES DAILY 100 each 0  . Blood Glucose Monitoring Suppl (ACCU-CHEK NANO SMARTVIEW) W/DEVICE KIT Use to test blood glucose 2 times daily. Dx:E11.9 1 kit 0  . bumetanide (BUMEX) 1 MG tablet Take 2 tabs in AM and 1 tab in PM    . diclofenac sodium (VOLTAREN) 1 % GEL Apply 4 g topically 4 (four) times daily. (Patient not taking: Reported on 05/11/2015) 100 g 0  . digoxin (LANOXIN) 0.125 MG tablet TAKE ONE-HALF TABLET BY MOUTH EVERY OTHER DAY 15 tablet 0  . furosemide (LASIX) 80 MG tablet Take 1 tablet (80 mg total) by mouth 2 (two) times daily. 60 tablet 3  . glucose blood (ACCU-CHEK SMARTVIEW) test strip Use to test blood glucose 2 times daily. Dx:E11.9 100 each 12  . LANTUS SOLOSTAR 100 UNIT/ML Solostar Pen INJECT 30 UNITS INTO  THE SKIN AT BEDTIME. 15 pen 3  . losartan (COZAAR) 25 MG tablet Take 0.5 tablets (12.5 mg total) by mouth 2 (two) times daily. 60 tablet 4  . nitroGLYCERIN (NITROSTAT) 0.4 MG SL tablet Place 1 tablet (0.4 mg total) under the tongue every 5 (five) minutes as needed. For chest pain. 25 tablet 11  . potassium chloride SA (KLOR-CON M20) 20 MEQ tablet Take 1 tablet (20 mEq total) by mouth once a week. SATURDAYS 15 tablet 3  . potassium chloride SA (KLOR-CON M20) 20 MEQ tablet Take 1 tablet (20 mEq total) by mouth once a week. SATURDAYS 10 tablet 3  . potassium chloride SA (KLOR-CON M20) 20 MEQ tablet TAKE ONE TABLET ( 20 MEQ) BY MOUTH EVERY MORNING. TAKE EXTRA TABLET ( 20 MEQ) IF YOU TAKE LASIX 60 tablet 3  . spironolactone (ALDACTONE) 25 MG tablet Take 1 tablet (25 mg total) by mouth daily. 30 tablet 3  . [DISCONTINUED] sitaGLIPtin (JANUVIA) 25 MG tablet Take 1 tablet (25 mg total) by mouth daily. 30 tablet 1   No current  facility-administered medications for this visit.   Family History  Problem Relation Age of Onset  . Heart attack Mother     Died age 66  . Diabetes Maternal Grandmother   . Heart attack Cousin   . Heart attack Maternal Uncle    Social History   Social History  . Marital Status: Widowed    Spouse Name: N/A  . Number of Children: N/A  . Years of Education: N/A   Occupational History  . disable    Social History Main Topics  . Smoking status: Former Smoker    Types: Cigarettes    Quit date: 03/03/2011  . Smokeless tobacco: None  . Alcohol Use: No  . Drug Use: No     Comment: Has history of cocaine use, denies recent  . Sexual Activity: Not Currently   Other Topics Concern  . None   Social History Narrative   Review of Systems: Review of Systems  Constitutional: Negative for fever and chills.  Eyes: Negative for blurred vision.  Respiratory: Negative for shortness of breath.   Cardiovascular: Negative for chest pain.  Gastrointestinal: Negative for nausea, vomiting and abdominal pain.  Genitourinary: Negative for frequency.  Endo/Heme/Allergies: Negative for polydipsia.    Objective:  Physical Exam: Filed Vitals:   06/27/15 0829 06/27/15 0830  BP:  104/61  Pulse:  55  Temp: 97.7 F (36.5 C)   TempSrc: Oral   Height: _0  (1.626 m)   Weight: 199 lb 8 oz (90.493 kg)   SpO2:  98%   Physical Exam  Constitutional: She appears well-developed and well-nourished. No distress.  HENT:  Head: Normocephalic and atraumatic.  Nose: Nose normal.  Eyes: Conjunctivae and EOM are normal. No scleral icterus.  Cardiovascular: Normal rate, regular rhythm and normal heart sounds.  Exam reveals no gallop and no friction rub.   No murmur heard. Pulmonary/Chest: Effort normal and breath sounds normal. No respiratory distress. She has no wheezes. She has no rales.  Abdominal: Soft. Bowel sounds are normal. She exhibits no distension. There is no tenderness. There is no  rebound and no guarding.  Skin: Skin is warm and dry. No rash noted. She is not diaphoretic. No erythema. No pallor.   Assessment & Plan:   Please see problem based assessment and plan.

## 2015-06-27 NOTE — Assessment & Plan Note (Signed)
Pt has documented cirrhosis on her chart and she has never been told this before. She had an abd [place] in 2011 that revealed a nodular liver consistent w/ cirrhosis. She endorses hx of alcohol abuse but does not current drink. She had a negative hepatitis panel in 2011 and 2015. She does not recall ever being vaccinated for Hep A or B.   - given hep A and B vaccination today. RTC in 1 month for second Hep B vaccination.

## 2015-06-27 NOTE — Patient Instructions (Signed)
Start taking Januvia '100mg'$  once a day for your diabetes.  Come back in 1 month for your second dose of Hepatitis B vaccination.

## 2015-06-27 NOTE — Progress Notes (Signed)
Case discussed with Dr. Hulen Luster at the time of the visit. We reviewed the resident's history and exam and pertinent patient test results. I agree with the assessment, diagnosis, and plan of care documented in the resident's note.  Pt. did not get her digoxin level checked as requested in March.  Will ask that it get drawn at follow-up.

## 2015-06-27 NOTE — Assessment & Plan Note (Signed)
Lab Results  Component Value Date   HGBA1C 7.8 06/27/2015   HGBA1C 7.1 01/17/2015   HGBA1C 7.1 10/18/2014     Assessment: Diabetes control:  not well controlled Progress toward A1C goal:   deteriorated  Comments: on lantus 30 units qhs. Has not been adherent w/ DM diet  Plan: Medications:  continue current medications, started on januvia '100mg'$  daily Home glucose monitoring: Frequency:  tid Timing:  w meals Instruction/counseling given: reminded to bring blood glucose meter & log to each visit Educational resources provided:   Self management tools provided: copy of home glucose meter download Other plans: f/u in 3 months for a1c. Given nutritional handout, pt to call clinic for appt w/ Butch Penny prn.

## 2015-06-28 ENCOUNTER — Other Ambulatory Visit (HOSPITAL_COMMUNITY): Payer: Self-pay | Admitting: Internal Medicine

## 2015-07-03 ENCOUNTER — Telehealth (HOSPITAL_COMMUNITY): Payer: Self-pay

## 2015-07-03 MED ORDER — ATORVASTATIN CALCIUM 80 MG PO TABS: 80.0000 mg | ORAL_TABLET | Freq: Every day | ORAL | Status: DC

## 2015-07-03 NOTE — Telephone Encounter (Signed)
Patient called CHF clinic triage line to report that she has been trying to refill her "cholesterol medication" however, our office denied this request. In looking through her chart, this RN is not able to see where a denial was sent to pharmacy or any other reason why that would be so. Her lipitor 80 mg tablet once daily is still an active medication on her list. Left return VM for patient explaining this and that I would send in refills for her lipitor 80 mg tablet to preferred pharmacy in chart electronically. Advised to return our call if this is not the medication she is speaking of, and/or if she prefers a different pharmacy fill this Rx.  Renee Pain

## 2015-07-04 ENCOUNTER — Encounter: Payer: Medicare Other | Admitting: *Deleted

## 2015-07-06 ENCOUNTER — Encounter: Payer: Self-pay | Admitting: Cardiology

## 2015-07-06 ENCOUNTER — Ambulatory Visit (INDEPENDENT_AMBULATORY_CARE_PROVIDER_SITE_OTHER): Payer: Medicare Other | Admitting: Cardiology

## 2015-07-06 DIAGNOSIS — I255 Ischemic cardiomyopathy: Secondary | ICD-10-CM

## 2015-07-07 ENCOUNTER — Other Ambulatory Visit (HOSPITAL_COMMUNITY): Payer: Self-pay | Admitting: Internal Medicine

## 2015-07-12 ENCOUNTER — Other Ambulatory Visit (HOSPITAL_COMMUNITY): Payer: Self-pay | Admitting: Internal Medicine

## 2015-07-17 ENCOUNTER — Other Ambulatory Visit (HOSPITAL_COMMUNITY): Payer: Self-pay | Admitting: *Deleted

## 2015-07-17 MED ORDER — ALLOPURINOL 100 MG PO TABS: 200.0000 mg | ORAL_TABLET | Freq: Every day | ORAL | Status: DC

## 2015-07-17 NOTE — Progress Notes (Signed)
Remote ICD transmission.   

## 2015-07-25 ENCOUNTER — Ambulatory Visit (INDEPENDENT_AMBULATORY_CARE_PROVIDER_SITE_OTHER): Payer: Medicare Other | Admitting: *Deleted

## 2015-07-25 DIAGNOSIS — Z23 Encounter for immunization: Secondary | ICD-10-CM | POA: Diagnosis present

## 2015-07-25 MED ORDER — HEPATITIS B VAC RECOMBINANT 10 MCG/ML IJ SUSP
1.0000 mL | Freq: Once | INTRAMUSCULAR | Status: AC
  Administered 2015-07-25: 10 ug via INTRAMUSCULAR

## 2015-07-28 ENCOUNTER — Other Ambulatory Visit (HOSPITAL_COMMUNITY): Payer: Self-pay | Admitting: Internal Medicine

## 2015-08-01 ENCOUNTER — Ambulatory Visit (HOSPITAL_COMMUNITY)
Admission: RE | Admit: 2015-08-01 | Discharge: 2015-08-01 | Disposition: A | Discharge status: Home / Self Care | Payer: Medicare Other | Source: Ambulatory Visit | Attending: Internal Medicine | Admitting: Internal Medicine

## 2015-08-01 VITALS — BP 123/68 | HR 65 | Resp 18 | Wt 200.5 lb

## 2015-08-01 DIAGNOSIS — I130 Hypertensive heart and chronic kidney disease with heart failure and stage 1 through stage 4 chronic kidney disease, or unspecified chronic kidney disease: Secondary | ICD-10-CM | POA: Diagnosis not present

## 2015-08-01 DIAGNOSIS — Z6834 Body mass index (BMI) 34.0-34.9, adult: Secondary | ICD-10-CM | POA: Diagnosis not present

## 2015-08-01 DIAGNOSIS — Z951 Presence of aortocoronary bypass graft: Secondary | ICD-10-CM | POA: Insufficient documentation

## 2015-08-01 DIAGNOSIS — E1122 Type 2 diabetes mellitus with diabetic chronic kidney disease: Secondary | ICD-10-CM | POA: Diagnosis not present

## 2015-08-01 DIAGNOSIS — I2510 Atherosclerotic heart disease of native coronary artery without angina pectoris: Secondary | ICD-10-CM | POA: Diagnosis not present

## 2015-08-01 DIAGNOSIS — Z95810 Presence of automatic (implantable) cardiac defibrillator: Secondary | ICD-10-CM | POA: Insufficient documentation

## 2015-08-01 DIAGNOSIS — I252 Old myocardial infarction: Secondary | ICD-10-CM | POA: Insufficient documentation

## 2015-08-01 DIAGNOSIS — I472 Ventricular tachycardia: Secondary | ICD-10-CM | POA: Diagnosis not present

## 2015-08-01 DIAGNOSIS — E669 Obesity, unspecified: Secondary | ICD-10-CM | POA: Insufficient documentation

## 2015-08-01 DIAGNOSIS — I5022 Chronic systolic (congestive) heart failure: Secondary | ICD-10-CM | POA: Insufficient documentation

## 2015-08-01 DIAGNOSIS — Z7982 Long term (current) use of aspirin: Secondary | ICD-10-CM | POA: Diagnosis not present

## 2015-08-01 DIAGNOSIS — Z794 Long term (current) use of insulin: Secondary | ICD-10-CM | POA: Diagnosis not present

## 2015-08-01 DIAGNOSIS — Z79899 Other long term (current) drug therapy: Secondary | ICD-10-CM | POA: Insufficient documentation

## 2015-08-01 DIAGNOSIS — N183 Chronic kidney disease, stage 3 (moderate): Secondary | ICD-10-CM | POA: Diagnosis not present

## 2015-08-01 DIAGNOSIS — E1151 Type 2 diabetes mellitus with diabetic peripheral angiopathy without gangrene: Secondary | ICD-10-CM | POA: Insufficient documentation

## 2015-08-01 LAB — BASIC METABOLIC PANEL
ANION GAP: 12 (ref 5–15)
BUN: 39 mg/dL — ABNORMAL HIGH (ref 6–20)
CALCIUM: 9.6 mg/dL (ref 8.9–10.3)
CHLORIDE: 99 mmol/L — AB (ref 101–111)
CO2: 24 mmol/L (ref 22–32)
CREATININE: 1.54 mg/dL — AB (ref 0.44–1.00)
GFR calc non Af Amer: 36 mL/min — ABNORMAL LOW (ref >60)
GFR, EST AFRICAN AMERICAN: 42 mL/min — AB (ref >60)
GLUCOSE: 149 mg/dL — AB (ref 65–99)
Potassium: 3.6 mmol/L (ref 3.5–5.1)
Sodium: 135 mmol/L (ref 135–145)

## 2015-08-01 LAB — T4, FREE: Free T4: 0.99 ng/dL (ref 0.61–1.12)

## 2015-08-01 LAB — TSH: TSH: 4.459 u[IU]/mL (ref 0.350–4.500)

## 2015-08-01 NOTE — Progress Notes (Signed)
Patient ID: [name], female   DOB: Nov 16, 1955, 60 y.o.   MRN: [id number]    Advanced Heart Failure Clinic Note  PCP: Dr. Trudee Kuster (Internal Medicine) Primary HF:  Dr. Haroldine Laws   HPI: 60 y.o. female with history of HTN, DM2, past polysubstance abuse (ETOH, tobacco, cocaine), CAD s/p CABG x5 with MV annuloplasty (2007), ischemic cardiomyopathy s/p Medtronic CRT-D, chronic systolic HF EF 74%, severe MR, CKD stage III and PAD. Blood type O+.  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 At time of cath LAD, LCX and RCA occluded. EF 45%. Had abrupt stent occlusion the next day and had to go back to the lab. 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   Amitted 8/2-10/07/13 for syncope s/p ICD shock. Concern that VT was possibly from ischemia and taken for [facility]. During cath patient had vagal response and BP dropped to 50s and co-ox was in the upper 40s. Placed on Levophed and transferred to floor. Started on Amiodarone.   On 11/25/13 underwent RHC for low output symptoms. Hemodynamic borderline. Swan left in and she was observed. Cardiac output dropped while in hospital so started on milrinone. Discharged home. Underwent CRT-D upgrade on 12/02/13  In 12/15, she was admitted with catheter infection. She was admitted and catheter was replaced.  She has completed antibiotic.    She has been titrated off milrinone. She has been off since January 2016.   She returns today for regular follow up. At last visit was volume overloaded and lasix increased with added metolazone. However on labwork her Creatinine was markedly worse (1.45 -> 3.10).  Diuretics held and metolazone stopped. Creatinine greatly improved on repeat labs. Her weight is stable from last visit.  Says she is doing great. Taking metolazone once a week. Denies any DOE, orthopnea, or PND. Fine with stairs. Says her stomach feels a little bloated so thought her  fluid was up. She works part time watching some elderly people. Weight at home 196 to 200. Graduated Cardiac Rehab.   Optivol: Fluid below threshold, with ups and downs every few weeks. ~4 hrs activity a day.  No VT/AF  05/19/2012  EF 15% 2/15 EF 15%, diffuse hypokinesis, restrictive diastolic function, s/p MV repair with moderate-severe MR.  4/15: EF 15%, severe MR, mod TR 2/17 EF 20-25%  CPX: 06/17/12 Peak VO2 14.8 (predicted peak VO2 68.5%), VE/VCO2 slope 43.4 OUES: 1.19, Peak RER 1.12 Vent threshold 11.3 (pred peak VO2 52.3%)  CPX (3/15): peak VO2 15.6 (predicted peak VO2 74.5%) VE/VO2 40.8, RER 1.2 CPX (6/16): peak VO2: 13.3 (68.6% predicted peak VO2) - corrects to 18.6 ml/kg/min for iBW   -VE/VCO2 slope: 33.7, OUES: 1.20, Peak RER: 1.12, VE/MVV: 48.4%,O2pulse: 11 (100% predicted O2pulse)  LHC (10/2013) 1) severe native CAD with all bypass grafts patent  11/01/13 RHC RA = 5  RV = 47/3/10  PA = 54/16 (30)  PCW = 14 v = 20  Fick cardiac output/index = 4.8/2.6  Thermo CO/CI = 3.7/2.0  PVR = 4.3 WU  FA sat = 95%  PA sat = 59%, 59%   Labs:  08/12/12 - on lipitor 40 mg daily Cholesterol 180 Triglyceride 195 HDL 29 LDL 112 K 4.5, creatinine 1.4 11/16/12 K 4.0 Labs 1.5 Pro BNP 181 02/14/13: K+ 4.6, Cr 1.33, Dig level 1.8, TSH 2.97 3/15: K 4.2, creatinine 1.07, HCT 32.4 06/2013: Dig level 0.3, pro-BNP 899, Cholesterol 140,  TG 106, HDL 36, LDL 82, Cr 1.5, K+ 4.9 11/11/13: K 4.1 Creatinine 1.8  01/31/14 Creatinine 1.48 K .5  02/07/14:  K 4.2 Creatinine 1.75 Magnesium 2.2 after sprionolactone 12.5 mg  was added  12/15: K 3.8, creatinine 1.32  04/04/2014: K 4.8 Creatinine 1.59 06/08/2014: K 3.3 Creatinine 1.47  04/2015: K 4.5 Creatinine 1.65 05/04/15: K 4.3 creatinine 1.45  FH: Mother deceased: CAD, DM2, HTN        Father deceased: stroke  SH: Works odds/end jobs for [employer]; disabled. Lives in [place] with 2 sons([name] and [name]).   ROS: All systems negative except as listed in HPI,  PMH and Problem List.  Past Medical History  Diagnosis Date  . Coronary artery disease     a. s/p CABG x 5 with MV annuloplasty 2007   . Diabetes mellitus   . Hypertension   . Chronic systolic heart failure ([place])     a. ICM b. ECHO (06/2013): EF 15%, severe MR (06/2013) c. RHC (10/2013): RA 5, RV 23/2/5, PA 25/6 (14), PCWP 12, Fick CO/CI: 3.2/1.7, PVR 0.7 WU, PA 45%, 47% and 55% (with levophed 5), vagal response during cath. d. On home milrinone.  . Polysubstance abuse     history of  (cocaine, tobacco and ETOH)  . V-tach ([place])   . Ischemic cardiomyopathy     a. s/p ICD. b. LHC (10/04/13): 1. Severe native CAD with all bypass grafts patent. c. CRT upgrade 12/2013.  . Implantable defibrillator   medtronic   . LBBB (left bundle branch block)   . AICD (automatic cardioverter/defibrillator) present   . PICC line infection     a. 01/2014 - PICC exchanged.  . CKD (chronic kidney disease), stage III   . PAD (peripheral artery disease) ([place])   . Lipoma     Current Outpatient Prescriptions  Medication Sig Dispense Refill  . ACCU-CHEK FASTCLIX LANCETS MISC Use to test blood glucose 2 times daily. Dx:E11.9 102 each 12  . acetaminophen (TYLENOL) 325 MG tablet Take 650 mg by mouth every 6 (six) hours as needed for mild pain.    [name] allopurinol (ZYLOPRIM) 100 MG tablet Take 2 tablets (200 mg total) by mouth at bedtime. 30 tablet 3  . amiodarone (PACERONE) 200 MG tablet Take 0.5 tablets (100 mg total) by mouth daily. 15 tablet 3  . aspirin EC 81 MG tablet Take 81 mg by mouth every morning.     [name] atorvastatin (LIPITOR) 80 MG tablet Take 1 tablet (80 mg total) by mouth daily. 90 tablet 3  . B-D ULTRAFINE III SHORT PEN 31G X 8 MM MISC USE TO INJECT INSULIN 3 TIMES DAILY 100 each 0  . Blood Glucose Monitoring Suppl (ACCU-CHEK NANO SMARTVIEW) W/DEVICE KIT Use to test blood glucose 2 times daily. Dx:E11.9 1 kit 0  . digoxin (LANOXIN) 0.125 MG tablet TAKE ONE-HALF TABLET BY MOUTH EVERY OTHER DAY 15 tablet 0    . furosemide (LASIX) 80 MG tablet Take 80 mg by mouth at bedtime.    [name] glucose blood (ACCU-CHEK SMARTVIEW) test strip Use to test blood glucose 2 times daily. Dx:E11.9 100 each 12  . LANTUS SOLOSTAR 100 UNIT/ML Solostar Pen INJECT 30 UNITS INTO THE SKIN AT BEDTIME. 15 pen 3  . losartan (COZAAR) 25 MG tablet Take 0.5 tablets (12.5 mg total) by mouth 2 (two) times daily. 60 tablet 4  . nitroGLYCERIN (NITROSTAT) 0.4 MG SL tablet Place 1 tablet (0.4 mg total) under the tongue every 5 (five) minutes  as needed. For chest pain. 25 tablet 11  . potassium chloride SA (KLOR-CON M20) 20 MEQ tablet Take 1 tablet (20 mEq total) by mouth once a week. SATURDAYS 15 tablet 3  . sitaGLIPtin (JANUVIA) 100 MG tablet Take 1 tablet (100 mg total) by mouth daily. 30 tablet 3  . spironolactone (ALDACTONE) 25 MG tablet Take 1 tablet (25 mg total) by mouth daily. 30 tablet 3   No current facility-administered medications for this encounter.    Filed Vitals:   08/01/15 0857  BP: 123/68  Pulse: 65  Resp: 18  Weight: 200 lb 8 oz (90.946 kg)  SpO2: 95%    Wt Readings from Last 3 Encounters:  08/01/15 200 lb 8 oz (90.946 kg)  06/27/15 199 lb 8 oz (90.493 kg)  05/11/15 199 lb (90.266 kg)     PHYSICAL EXAM: General:  Well appearing. NAD HEENT: normal Neck: supple. JVP 6-7  Carotids 2+ bilaterally; no bruits. R side of her neck is slightly more full than left. Cor: PMI normal. RRR. 2/6 SEM LSB 2/6 MR, no s3   Lungs: Clear, Normal effort Abdomen: obese, soft, NT, +distended, no HSM. No bruits or masses. +BS Extremities: no cyanosis, clubbing, rash. no edema. Fasciotomy scar RLE Neuro: alert & orientedx3, cranial nerves grossly intact. Moves all 4 extremities w/o difficulty. Affect pleasant.  ASSESSMENT & PLAN:  1. Chronic systolic CHF: Ischemic cardiomyopathy s/p Medtronic CRT-D, EF 15% (4/15).  She has been seen in the transplant clinic at [facility].  She is blood type O so matching for transplant may be difficult.   She may need LVAD prior to getting transplant, but continues to do very well off milrinone, may be 2/2 CRT upgrade.  NYHA class II-early III symptoms. CPX reassuring.  - Volume status stable on exam and Optivol. ReDS vest reading 27. Continue lasix 80 daily. With an extra 80 mg and metolazone as needed for fluid up.  BMET today.  - Will repeat CPX test, last done 2016 - Continue losartan 25 bid - Will not consider Entresto with recent AKI and previously failed up-titrations as above.  - Continue carvedilol 3.125 BID.  (already failed titration in 9/16) - Reinforced the need and importance of daily weights, a low sodium diet, and fluid restriction (less than 2 L a day). Instructed to call the HF clinic if weight increases more than 3 lbs overnight or 5 lbs in a week.  2. CAD: No chest pain.  Continue ASA and statin.  3. Mitral regurgitation: s/p mitral valve annuloplasty with CABG in 2007. Moderate to severe MR on echo in 4/15. TEE (06/2013) mitral regurgitation appeared severe, anatomy of repaired valve does not look suitable for MV clipping and would be high risk for MV replacement. Moderate MR on echo 2/17 4. CKD stage III: Stable last check. Recheck next week  5.  NSVT: Continue amiodarone 100 mg daily. Needs yearly eye exams, had one earlier this year.  6. Obesity - Body mass index is 34.4 kg/(m^2). Needs to keep weight down. If BMI > 35 will not be transplant candidate  - Needs to join an exercise program. Will refer to [facility] for HF program.  7. PAD: No further claudication.  Right mid SFA stenosis per [place] 08/30/14. Medically management.  8. Colon polyps -- Had colonoscopy 06/08/14 with 12 polyps -->Plan for colonoscopy every 2 years.    9. ? Thyroid Enlargment - TSH and Free T4 today.   Labs as above. Follow up 3 months.  Shirley Friar PA-C 08/01/2015   Patient seen and examined with Oda Kilts, PA-C. We discussed all aspects of the encounter. I agree with the assessment  and plan as stated above.   Stable NYHA II-III. Volume status looks ok on exam and Optivol. BP too tenuous to tolerate further med titration. Discussed need to repeat CPX for ongoing risk stratification for advanced therapies.   Maurio Baize,MD 4:20 PM

## 2015-08-01 NOTE — Patient Instructions (Signed)
Will schedule you for Cardiopulmonary Exercise Test. This test is done at our [facility]. Please wear comfortable clothes and shoes for this test. Avoid heavy meal before the test (light snack/meal recommended). Avoid caffeine, alcohol, tobacco products 12 hrs before test. Please give 24 hr notice for cancellations/rescheduling: [phone].  __________________________________________________________  __________________________________________________________  Routine lab work today. Will notify you of abnormal results, otherwise no news is good news!  Follow up 3 months with Dr. Haroldine Laws.  Do the following things EVERYDAY: 1) Weigh yourself in the morning before breakfast. Write it down and keep it in a log. 2) Take your medicines as prescribed 3) Eat low salt foods-Limit salt (sodium) to 2000 mg per day.  4) Stay as active as you can everyday 5) Limit all fluids for the day to less than 2 liters

## 2015-08-07 ENCOUNTER — Encounter: Payer: Self-pay | Admitting: Internal Medicine

## 2015-08-08 ENCOUNTER — Encounter: Payer: Self-pay | Admitting: Cardiology

## 2015-08-09 ENCOUNTER — Ambulatory Visit (HOSPITAL_COMMUNITY): Payer: Medicare Other | Attending: Internal Medicine

## 2015-08-09 DIAGNOSIS — I5022 Chronic systolic (congestive) heart failure: Secondary | ICD-10-CM | POA: Insufficient documentation

## 2015-08-10 ENCOUNTER — Other Ambulatory Visit (HOSPITAL_COMMUNITY): Payer: Self-pay | Admitting: *Deleted

## 2015-08-10 DIAGNOSIS — I5022 Chronic systolic (congestive) heart failure: Secondary | ICD-10-CM | POA: Diagnosis not present

## 2015-08-16 ENCOUNTER — Other Ambulatory Visit (HOSPITAL_COMMUNITY): Payer: Self-pay | Admitting: Internal Medicine

## 2015-08-23 ENCOUNTER — Telehealth (HOSPITAL_COMMUNITY): Payer: Self-pay | Admitting: *Deleted

## 2015-08-23 NOTE — Telephone Encounter (Signed)
Pt aware of results, she states she has been doing well.  sch appt for 7/27, advised if starts feeling bad before then to call us back.

## 2015-08-23 NOTE — Telephone Encounter (Signed)
Attempted to call pt w/CPX results and schedule a sooner appt, Left message to call back

## 2015-08-24 ENCOUNTER — Encounter: Payer: Self-pay | Admitting: Cardiology

## 2015-08-31 ENCOUNTER — Other Ambulatory Visit (HOSPITAL_COMMUNITY): Payer: Self-pay | Admitting: *Deleted

## 2015-08-31 MED ORDER — FUROSEMIDE 80 MG PO TABS: 80.0000 mg | ORAL_TABLET | Freq: Every day | ORAL | Status: DC

## 2015-09-10 ENCOUNTER — Other Ambulatory Visit: Payer: Self-pay | Admitting: Internal Medicine

## 2015-09-18 ENCOUNTER — Other Ambulatory Visit (HOSPITAL_COMMUNITY): Payer: Self-pay | Admitting: Internal Medicine

## 2015-09-19 ENCOUNTER — Encounter: Payer: Self-pay | Admitting: Internal Medicine

## 2015-09-19 ENCOUNTER — Ambulatory Visit (INDEPENDENT_AMBULATORY_CARE_PROVIDER_SITE_OTHER): Payer: Medicare Other | Admitting: Internal Medicine

## 2015-09-19 VITALS — BP 98/60 | HR 71 | Temp 97.8°F | Ht 64.0 in | Wt 199.0 lb

## 2015-09-19 DIAGNOSIS — Z87891 Personal history of nicotine dependence: Secondary | ICD-10-CM

## 2015-09-19 DIAGNOSIS — E119 Type 2 diabetes mellitus without complications: Secondary | ICD-10-CM

## 2015-09-19 DIAGNOSIS — M1A0790 Idiopathic chronic gout, unspecified ankle and foot, without tophus (tophi): Secondary | ICD-10-CM

## 2015-09-19 DIAGNOSIS — I5022 Chronic systolic (congestive) heart failure: Secondary | ICD-10-CM

## 2015-09-19 DIAGNOSIS — Z794 Long term (current) use of insulin: Secondary | ICD-10-CM | POA: Diagnosis not present

## 2015-09-19 DIAGNOSIS — Z7982 Long term (current) use of aspirin: Secondary | ICD-10-CM

## 2015-09-19 DIAGNOSIS — E118 Type 2 diabetes mellitus with unspecified complications: Secondary | ICD-10-CM

## 2015-09-19 DIAGNOSIS — Z79899 Other long term (current) drug therapy: Secondary | ICD-10-CM

## 2015-09-19 DIAGNOSIS — M109 Gout, unspecified: Secondary | ICD-10-CM

## 2015-09-19 LAB — POCT GLYCOSYLATED HEMOGLOBIN (HGB A1C): Hemoglobin A1C: 7.9

## 2015-09-19 LAB — GLUCOSE, CAPILLARY: Glucose-Capillary: 261 mg/dL — ABNORMAL HIGH (ref 65–99)

## 2015-09-19 MED ORDER — GLUCOSE BLOOD VI STRP: ORAL_STRIP | Status: DC

## 2015-09-19 MED ORDER — ALLOPURINOL 100 MG PO TABS: 200.0000 mg | ORAL_TABLET | Freq: Every day | ORAL | Status: DC

## 2015-09-19 MED ORDER — SITAGLIPTIN PHOSPHATE 100 MG PO TABS: 50.0000 mg | ORAL_TABLET | Freq: Every day | ORAL | Status: DC

## 2015-09-19 NOTE — Progress Notes (Signed)
Patient ID: [name], female   DOB: [birth year], 60 y.o.   MRN: [id number]   CC: Diabetes  HPI:  [name] is a 60 y.o. female with PMH as listed below who presents for follow up management of her T2DM. Please see problem based charting for status of patient's chronic medical issues.  Past Medical History  Diagnosis Date  . Coronary artery disease     a. s/p CABG x 5 with MV annuloplasty 2007   . Diabetes mellitus   . Hypertension   . Chronic systolic heart failure ([place])     a. ICM b. ECHO (06/2013): EF 15%, severe MR (06/2013) c. RHC (10/2013): RA 5, RV 23/2/5, PA 25/6 (14), PCWP 12, Fick CO/CI: 3.2/1.7, PVR 0.7 WU, PA 45%, 47% and 55% (with levophed 5), vagal response during cath. d. On home milrinone.  . Polysubstance abuse     history of  (cocaine, tobacco and ETOH)  . V-tach ([place])   . Ischemic cardiomyopathy     a. s/p ICD. b. LHC (10/04/13): 1. Severe native CAD with all bypass grafts patent. c. CRT upgrade 12/2013.  . Implantable defibrillator   medtronic   . LBBB (left bundle branch block)   . AICD (automatic cardioverter/defibrillator) present   . PICC line infection     a. 01/2014 - PICC exchanged.  . CKD (chronic kidney disease), stage III   . PAD (peripheral artery disease) ([place])   . Lipoma     Review of Systems:  Review of Systems  Constitutional: Negative for weight loss and diaphoresis.  Respiratory: Negative for cough and shortness of breath.   Cardiovascular: Negative for chest pain and leg swelling.  Gastrointestinal: Negative for nausea and vomiting.  Neurological: Negative for dizziness.     Physical Exam:  Filed Vitals:   09/19/15 1324  BP: 98/60  Pulse: 71  Temp: 97.8 F (36.6 C)  TempSrc: Oral  Height: '5\' 4"'$  (1.626 m)  Weight: 199 lb (90.266 kg)  SpO2: 98%   Physical Exam  Constitutional: She is oriented to person, place, and time. She appears well-developed and well-nourished. No distress.  HENT:  Head: Normocephalic and  atraumatic.  Cardiovascular: Normal rate and regular rhythm.   Pulses:      Dorsalis pedis pulses are 2+ on the right side, and 2+ on the left side.  Pulmonary/Chest: Effort normal. No respiratory distress. She has no wheezes. She has no rales.  Musculoskeletal: She exhibits no edema or tenderness.  Neurological: She is alert and oriented to person, place, and time.  Skin: She is not diaphoretic.  Psychiatric: She has a normal mood and affect.     Assessment & Plan:   See encounters tab for problem based medical decision making.   Patient discussed with Dr. Angelia Mould

## 2015-09-19 NOTE — Patient Instructions (Signed)
Please decrease your Januvia to 50 mg daily (half tablet). Please continue your Lantus 30 units at night. I will refer you to Debera Lat RD for nutrition and dietary education and plan. Please check your blood sugar at least twice a day. I have refilled your test strips.  Please call your Eye Doctor (Dr. Venetia Maxon) for an annual eye exam.  Please continue to follow up with Cardiology and check your weight daily. Please call if your weight increases 3 lbs in 1 night or 5 lbs in a week.  Follow up with [place] in 3 months or sooner if needed.

## 2015-09-21 NOTE — Progress Notes (Signed)
Internal Medicine Clinic Attending  Case discussed with Dr. Patel,Vishal at the time of the visit.  We reviewed the resident's history and exam and pertinent patient test results.  I agree with the assessment, diagnosis, and plan of care documented in the resident's note.  

## 2015-09-21 NOTE — Assessment & Plan Note (Signed)
Hgb A1c on 06/27/15 was 7.8. She currently takes Lantus 30 units at night and Januvia 100 mg daily which was added in April. She reports seeing occasional high blood sugars at home around 250 and no lows. She does not report any recent hypo or hyperglycemic symptomatic episodes. She says she is not exercising as much as she used to since she graduated from cardiac rehab. She is working on her diet and says she is eating a lot of fruit. She is avoiding soft drinks and sweet tea.  A/P: Hgb A1c is 7.9 this visit, relatively stable from prior. She would benefit from resuming an exercise regimen, which she will start at the [facility] per referral from Heart Failure team. I think we should aim for a goal A1c <8.0 to avoid too strict glycemic control given her significant comorbidities and possible adverse effects from strict control. We will continue with current Lantus, dietary, and exercise changes. Will decrease her Januvia dosing given renal function. -Continue Lantus 30 units qhs -Decrease Januvia to 50 mg daily -Refer to Debera Lat, RD for dietary assistance -Refer to ophtamalogy for eye exam -Foot exam completed today -Continue with plan for exercise program at [facility] -Repeat A1c in 3 months

## 2015-09-21 NOTE — Assessment & Plan Note (Signed)
Patient with history of gout affecting the great toe. She is taking Allopurinol daily for prophylaxis which is helpful and requesting a refill at this time. No acute gout attacks reported.  A/P: Well controlled on current therapy. -Refill Allopurinol

## 2015-09-21 NOTE — Assessment & Plan Note (Signed)
Patient continues to follow closely with Heart Failure clinic. She is checking her weight daily and has takes an extra fluid pill as needed if she notices increase in her weight or edema. She says her weight is stable at 200 lbs recently. She denies any current chest pain or SOB.  A/P: Weight this visit is 199 lbs compared to last weight of 200 lbs. Lungs are clear to auscultation and no pedal edema noted on exam. She reports adherence to her current heart failure medications and will follow up with Heart Failure next week. -Continue current medications -Call if weight increases 3 lbs overnight or 5 lbs in a week -F/u with Heart Failure as planned -Continue with planned exercise program through [facility]

## 2015-09-22 ENCOUNTER — Other Ambulatory Visit (HOSPITAL_COMMUNITY): Payer: Self-pay | Admitting: Internal Medicine

## 2015-09-25 ENCOUNTER — Ambulatory Visit (INDEPENDENT_AMBULATORY_CARE_PROVIDER_SITE_OTHER): Payer: Medicare Other | Admitting: Dietician

## 2015-09-25 DIAGNOSIS — E118 Type 2 diabetes mellitus with unspecified complications: Secondary | ICD-10-CM

## 2015-09-25 DIAGNOSIS — Z713 Dietary counseling and surveillance: Secondary | ICD-10-CM | POA: Diagnosis not present

## 2015-09-25 NOTE — Patient Instructions (Signed)
Check sugars before each meal and watch carbs.   Try to stay between 40-60 grams carb for each meal.  Bring meter to next appointment  Write down what you eat and drink for the 3 days that you keep track of your blood sugars on the sheet provided. Bring sheet with blood sugars to our next meeting.

## 2015-09-25 NOTE — Progress Notes (Signed)
  Medical Nutrition Therapy:  Appt start time: 1430 end time:  1500. Visit # 1, last visit was dsmt in 2014 x2 visit  Assessment:  Primary concerns today:  blood sugar  Ms. Lahti did not bring her meter, but says she needs recipes and menus to help her with her blood sugar spikes and her fasting blood sugar which has increased lately. She says she was on mealtime insulin a few years ago, but her blood sugar got too low and she was taken off. Note her doctor set her a1C goal at 8%.  She reports her fasting blood sugars are all > 130 and her blood sugar spikes from 100s before meals (148 this am )  to 200s after meals ( >250 1 hour after breakfast ~ 1 cup special K cereal & 1/2 cup milk today) Her overall diet intake is healthy and appropriate although likely her portions are larger than she realizes at times.   Preferred Learning Style: No preference indicated  Learning Readiness: Ready, Change in progress  ANTHROPOMETRICS: weight-199#, BMI-34 WEIGHT HISTORY: has gained 20-25# in past 3-4 years. SLEEP:not discussed today MEDICATIONS: lantus 30 units daily & januvia 50 mg daily BLOOD SUGAR: as above, no meter with her today DIETARY INTAKE: Usual eating pattern includes 3 meals and 1-2 snacks per day. Everyday foods include baked foods, fruit, whole grain bread, berry special K, 2% milk.     24-hr recall: not done entirely today Beverages: water, diet soda  Usual physical activity: not discussed today  Progress Towards Goal(s):  In progress.   Nutritional Diagnosis:  NB-1.1 Food and nutrition-related knowledge deficit As related to lack of carbohydrate foods and portions and theri affect on blood sugars.  As evidenced by her report and questions.    Intervention:  Nutrition education about carb content of foods, how much she should have per meal and before and after meal glucose testing. . Coordination of care: If unable to control postmeal blood sugars with MNT, consider a GLP1 added to the  basal insulin instead of januvia.   Teaching Method Utilized: Visual,Auditory, Hands on Handouts given during visit include:avs, carb counting and meal planning booklet Barriers to learning/adherence to lifestyle change:competing values Demonstrated degree of understanding via:  Teach Back   Monitoring/Evaluation:  Dietary intake, exercise, meter and 360 view along with food records, and body weight in 4 week(s).

## 2015-09-27 ENCOUNTER — Encounter (HOSPITAL_COMMUNITY): Payer: Self-pay | Admitting: Internal Medicine

## 2015-10-03 ENCOUNTER — Other Ambulatory Visit (HOSPITAL_COMMUNITY): Payer: Self-pay | Admitting: *Deleted

## 2015-10-03 MED ORDER — DIGOXIN 125 MCG PO TABS: ORAL_TABLET | ORAL | 0 refills | Status: DC

## 2015-10-05 ENCOUNTER — Ambulatory Visit: Payer: Self-pay | Admitting: Dietician

## 2015-10-05 ENCOUNTER — Telehealth: Payer: Self-pay | Admitting: Dietician

## 2015-10-12 NOTE — Telephone Encounter (Signed)
Called to follow up on her goals to better control her post prandial blood sugars. Left message for return call.

## 2015-10-15 ENCOUNTER — Ambulatory Visit (HOSPITAL_COMMUNITY)
Admission: RE | Admit: 2015-10-15 | Discharge: 2015-10-15 | Disposition: A | Discharge status: Home / Self Care | Payer: Medicare Other | Source: Ambulatory Visit | Attending: Internal Medicine | Admitting: Internal Medicine

## 2015-10-15 VITALS — BP 102/62 | HR 63 | Wt 197.4 lb

## 2015-10-15 DIAGNOSIS — I739 Peripheral vascular disease, unspecified: Secondary | ICD-10-CM | POA: Diagnosis not present

## 2015-10-15 DIAGNOSIS — N183 Chronic kidney disease, stage 3 (moderate): Secondary | ICD-10-CM | POA: Diagnosis not present

## 2015-10-15 DIAGNOSIS — I252 Old myocardial infarction: Secondary | ICD-10-CM | POA: Diagnosis not present

## 2015-10-15 DIAGNOSIS — Z951 Presence of aortocoronary bypass graft: Secondary | ICD-10-CM | POA: Insufficient documentation

## 2015-10-15 DIAGNOSIS — Z86010 Personal history of colonic polyps: Secondary | ICD-10-CM | POA: Insufficient documentation

## 2015-10-15 DIAGNOSIS — E669 Obesity, unspecified: Secondary | ICD-10-CM | POA: Diagnosis not present

## 2015-10-15 DIAGNOSIS — Z6833 Body mass index (BMI) 33.0-33.9, adult: Secondary | ICD-10-CM | POA: Diagnosis not present

## 2015-10-15 DIAGNOSIS — Z7982 Long term (current) use of aspirin: Secondary | ICD-10-CM | POA: Insufficient documentation

## 2015-10-15 DIAGNOSIS — Z95810 Presence of automatic (implantable) cardiac defibrillator: Secondary | ICD-10-CM | POA: Diagnosis not present

## 2015-10-15 DIAGNOSIS — I5022 Chronic systolic (congestive) heart failure: Secondary | ICD-10-CM

## 2015-10-15 DIAGNOSIS — Z794 Long term (current) use of insulin: Secondary | ICD-10-CM | POA: Diagnosis not present

## 2015-10-15 DIAGNOSIS — I2510 Atherosclerotic heart disease of native coronary artery without angina pectoris: Secondary | ICD-10-CM | POA: Diagnosis not present

## 2015-10-15 DIAGNOSIS — I255 Ischemic cardiomyopathy: Secondary | ICD-10-CM | POA: Insufficient documentation

## 2015-10-15 DIAGNOSIS — Z833 Family history of diabetes mellitus: Secondary | ICD-10-CM | POA: Diagnosis not present

## 2015-10-15 DIAGNOSIS — I130 Hypertensive heart and chronic kidney disease with heart failure and stage 1 through stage 4 chronic kidney disease, or unspecified chronic kidney disease: Secondary | ICD-10-CM | POA: Diagnosis not present

## 2015-10-15 DIAGNOSIS — E1122 Type 2 diabetes mellitus with diabetic chronic kidney disease: Secondary | ICD-10-CM | POA: Insufficient documentation

## 2015-10-15 DIAGNOSIS — Z8249 Family history of ischemic heart disease and other diseases of the circulatory system: Secondary | ICD-10-CM | POA: Diagnosis not present

## 2015-10-15 DIAGNOSIS — Z823 Family history of stroke: Secondary | ICD-10-CM | POA: Diagnosis not present

## 2015-10-15 DIAGNOSIS — Z79899 Other long term (current) drug therapy: Secondary | ICD-10-CM | POA: Diagnosis not present

## 2015-10-15 LAB — COMPREHENSIVE METABOLIC PANEL
ALBUMIN: 4.3 g/dL (ref 3.5–5.0)
ALK PHOS: 77 U/L (ref 38–126)
ALT: 48 U/L (ref 14–54)
ANION GAP: 11 (ref 5–15)
AST: 35 U/L (ref 15–41)
BUN: 33 mg/dL — AB (ref 6–20)
CHLORIDE: 97 mmol/L — AB (ref 101–111)
CO2: 27 mmol/L (ref 22–32)
CREATININE: 1.62 mg/dL — AB (ref 0.44–1.00)
Calcium: 10.1 mg/dL (ref 8.9–10.3)
GFR calc non Af Amer: 34 mL/min — ABNORMAL LOW (ref >60)
GFR, EST AFRICAN AMERICAN: 39 mL/min — AB (ref >60)
Glucose, Bld: 178 mg/dL — ABNORMAL HIGH (ref 65–99)
Potassium: 3.9 mmol/L (ref 3.5–5.1)
SODIUM: 135 mmol/L (ref 135–145)
TOTAL PROTEIN: 7.7 g/dL (ref 6.5–8.1)
Total Bilirubin: 0.6 mg/dL (ref 0.3–1.2)

## 2015-10-15 NOTE — Progress Notes (Signed)
Advanced Heart Failure Medication Review by a Pharmacist  Does the patient  feel that his/her medications are working for him/her?  yes  Has the patient been experiencing any side effects to the medications prescribed?  no  Does the patient measure his/her own blood pressure or blood glucose at home?  yes   Does the patient have any problems obtaining medications due to transportation or finances?   no  Understanding of regimen: good Understanding of indications: good Potential of compliance: good Patient understands to avoid NSAIDs. Patient understands to avoid decongestants.  Issues to address at subsequent visits: None   Pharmacist comments:  [name] is a pleasant 60 yo F presenting without a medication list but with good recall of her regimen. She reports good compliance with her regimen and states that she generally needs to take metolazone every Sunday for weight gain. No other medication-related questions or concerns for me at this time.   [name], PharmD, BCPS, CPP Clinical Pharmacist Pager: [phone] Phone: [phone] 10/15/2015 9:21 AM      Time with patient: 10 minutes Preparation and documentation time: 2 minutes Total time: 12 minutes

## 2015-10-15 NOTE — Progress Notes (Signed)
Patient ID: [name], female   DOB: September 24, 1955, 60 y.o.   MRN: [id number]    Advanced Heart Failure Clinic Note  PCP: Dr. Trudee Kuster (Internal Medicine) Primary HF:  Dr. Haroldine Laws   HPI: 60 y.o. female with history of HTN, DM2, past polysubstance abuse (ETOH, tobacco, cocaine), CAD s/p CABG x5 with MV annuloplasty (2007), ischemic cardiomyopathy s/p Medtronic CRT-D, chronic systolic HF EF 81%, severe MR, CKD stage III and PAD. Blood type O+.  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 At time of cath LAD, LCX and RCA occluded. EF 45%. Had abrupt stent occlusion the next day and had to go back to the lab. 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   Amitted 8/2-10/07/13 for syncope s/p ICD shock. Concern that VT was possibly from ischemia and taken for [facility]. During cath patient had vagal response and BP dropped to 50s and co-ox was in the upper 40s. Placed on Levophed and transferred to floor. Started on Amiodarone.   On 11/25/13 underwent RHC for low output symptoms. Hemodynamic borderline. Swan left in and she was observed. Cardiac output dropped while in hospital so started on milrinone. Discharged home. Underwent CRT-D upgrade on 12/02/13  In 12/15, she was admitted with catheter infection. She was admitted and catheter was replaced.  She has completed antibiotic.    She has been titrated off milrinone. She has been off since January 2016.   She presents today for regular follow up. Weight down 3 lbs from last visit. Takes metolazone every Sunday. Doing great overall.  Drinks ~ 2 L, goes over often.  Watches her salt, reads labels, and uses fresh foods as often as she can. Mostly limited by hip and leg pain. No SOB on flat ground.  Avoids hills and stairs. 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 Weight at home 194 - 200 lbs throughout the week.  Not currently in an exercise program. States she was told it  would cost for her to go the the [facility].   She states that occasionally her vision goes out, going to see optometrist. She states it gets very blurry, and has blacked out at times. Has happened 3 times since July 4th ( that morning and evening, and then again on August 4th).  Optivol: Fluid level up and down, with no recent crossings. Thoracic impedence currently below threshold. Pt activity ~ 4 hrs a day.  No VT/VF. No AT/AF.   CPX (6/17) peak VO2 13.3 (predicted peak 71%), VE/VCO2 slope 41, RER 1.19 OUES 1.15 (pVO2 corrects to 19.2 ml/kg/min for iBW)  05/19/2012  EF 15% 2/15 EF 15%, diffuse hypokinesis, restrictive diastolic function, s/p MV repair with moderate-severe MR.  4/15: EF 15%, severe MR, mod TR 2/17 EF 20-25%  CPX: 06/17/12 Peak VO2 14.8 (predicted peak VO2 68.5%), VE/VCO2 slope 43.4 OUES: 1.19, Peak RER 1.12 Vent threshold 11.3 (pred peak VO2 52.3%)  CPX (3/15): peak VO2 15.6 (predicted peak VO2 74.5%) VE/VO2 40.8, RER 1.2 CPX (6/16): peak VO2: 13.3 (68.6% predicted peak VO2) - corrects to 18.6 ml/kg/min for iBW   -VE/VCO2 slope: 33.7, OUES: 1.20, Peak RER: 1.12, VE/MVV: 48.4%,O2pulse: 11 (100% predicted O2pulse)   LHC (10/2013) 1) severe native CAD with all bypass grafts patent  11/01/13 RHC RA = 5  RV = 47/3/10  PA = 54/16 (30)  PCW = 14 v = 20  Fick cardiac output/index =  4.8/2.6  Thermo CO/CI = 3.7/2.0  PVR = 4.3 WU  FA sat = 95%  PA sat = 59%, 59%   Labs:  08/12/12 - on lipitor 40 mg daily Cholesterol 180 Triglyceride 195 HDL 29 LDL 112 K 4.5, creatinine 1.4 11/16/12 K 4.0 Labs 1.5 Pro BNP 181 02/14/13: K+ 4.6, Cr 1.33, Dig level 1.8, TSH 2.97 3/15: K 4.2, creatinine 1.07, HCT 32.4 06/2013: Dig level 0.3, pro-BNP 899, Cholesterol 140, TG 106, HDL 36, LDL 82, Cr 1.5, K+ 4.9 11/11/13: K 4.1 Creatinine 1.8  01/31/14 Creatinine 1.48 K .5  02/07/14:  K 4.2 Creatinine 1.75 Magnesium 2.2 after sprionolactone 12.5 mg  was added  12/15: K 3.8, creatinine 1.32    04/04/2014: K 4.8 Creatinine 1.59 06/08/2014: K 3.3 Creatinine 1.47  04/2015: K 4.5 Creatinine 1.65 05/04/15: K 4.3 creatinine 1.45  FH: Mother deceased: CAD, DM2, HTN        Father deceased: stroke  SH: Works odds/end jobs for [employer]; disabled. Lives in [place] with 2 sons([name] and [name]).   ROS: All systems negative except as listed in HPI, PMH and Problem List.  Past Medical History:  Diagnosis Date  . AICD (automatic cardioverter/defibrillator) present   . Chronic systolic heart failure ([place])    a. ICM b. ECHO (06/2013): EF 15%, severe MR (06/2013) c. RHC (10/2013): RA 5, RV 23/2/5, PA 25/6 (14), PCWP 12, Fick CO/CI: 3.2/1.7, PVR 0.7 WU, PA 45%, 47% and 55% (with levophed 5), vagal response during cath. d. On home milrinone.  . CKD (chronic kidney disease), stage III   . Coronary artery disease    a. s/p CABG x 5 with MV annuloplasty 2007   . Diabetes mellitus   . Hypertension   . Implantable defibrillator   medtronic   . Ischemic cardiomyopathy    a. s/p ICD. b. LHC (10/04/13): 1. Severe native CAD with all bypass grafts patent. c. CRT upgrade 12/2013.  [name] LBBB (left bundle branch block)   . Lipoma   . PAD (peripheral artery disease) ([place])   . PICC line infection    a. 01/2014 - PICC exchanged.  . Polysubstance abuse    history of  (cocaine, tobacco and ETOH)  . V-tach [facility])     Current Outpatient Prescriptions  Medication Sig Dispense Refill  . ACCU-CHEK FASTCLIX LANCETS MISC Use to test blood glucose 2 times daily. Dx:E11.9 102 each 12  . acetaminophen (TYLENOL) 325 MG tablet Take 650 mg by mouth every 6 (six) hours as needed for mild pain.    [name] allopurinol (ZYLOPRIM) 100 MG tablet Take 2 tablets (200 mg total) by mouth at bedtime. 60 tablet 3  . amiodarone (PACERONE) 200 MG tablet Take 0.5 tablets (100 mg total) by mouth daily. 15 tablet 3  . aspirin EC 81 MG tablet Take 81 mg by mouth every morning.     [name] atorvastatin (LIPITOR) 80 MG tablet Take 1 tablet (80 mg total)  by mouth daily. 90 tablet 3  . B-D ULTRAFINE III SHORT PEN 31G X 8 MM MISC USE TO INJECT INSULIN THREE TIMES DAILY 100 each 0  . Blood Glucose Monitoring Suppl (ACCU-CHEK NANO SMARTVIEW) W/DEVICE KIT Use to test blood glucose 2 times daily. Dx:E11.9 1 kit 0  . diclofenac sodium (VOLTAREN) 1 % GEL Apply 2 g topically 4 (four) times daily as needed (back pain).    [name] digoxin (LANOXIN) 0.125 MG tablet TAKE ONE-HALF TABLET BY MOUTH EVERY OTHER DAY 15 tablet 0  . furosemide (  LASIX) 80 MG tablet Take 1 tablet (80 mg total) by mouth daily. 30 tablet 3  . glucose blood (ACCU-CHEK SMARTVIEW) test strip Use to test blood glucose 2 times daily. Dx:E11.9 100 each 12  . LANTUS SOLOSTAR 100 UNIT/ML Solostar Pen INJECT 30 UNITS INTO THE SKIN AT BEDTIME. 15 pen 3  . losartan (COZAAR) 25 MG tablet Take 0.5 tablets (12.5 mg total) by mouth 2 (two) times daily. 60 tablet 4  . metolazone (ZAROXOLYN) 2.5 MG tablet Take 2.5 mg by mouth as needed (weight gain).    . potassium chloride SA (K-DUR,KLOR-CON) 20 MEQ tablet Take 20 mEq by mouth as needed (with metolazone).    . sitaGLIPtin (JANUVIA) 100 MG tablet Take 0.5 tablets (50 mg total) by mouth daily. 30 tablet 3  . spironolactone (ALDACTONE) 25 MG tablet Take 1 tablet (25 mg total) by mouth daily. 30 tablet 3  . nitroGLYCERIN (NITROSTAT) 0.4 MG SL tablet Place 1 tablet (0.4 mg total) under the tongue every 5 (five) minutes as needed. For chest pain. (Patient not taking: Reported on 10/15/2015) 25 tablet 11   No current facility-administered medications for this encounter.     Vitals:   10/15/15 0905  BP: 102/62  BP Location: Right Arm  Patient Position: Sitting  Cuff Size: Normal  Pulse: 63  SpO2: 98%  Weight: 197 lb 6 oz (89.5 kg)    Wt Readings from Last 3 Encounters:  10/15/15 197 lb 6 oz (89.5 kg)  09/19/15 199 lb (90.3 kg)  08/01/15 200 lb 8 oz (90.9 kg)     PHYSICAL EXAM: General:  Well appearing. NAD HEENT: normal Neck: supple. JVP 6-7   Carotids 2+ bilaterally; no bruits. R side of her neck is slightly more full than left. Cor: PMI normal. RRR. 2/6 SEM LSB 2/6 MR, no s3   Lungs: Clear, Normal effort Abdomen: obese, soft, NT, +distended, no HSM. No bruits or masses. +BS Extremities: no cyanosis, clubbing, rash. no edema. Fasciotomy scar RLE Neuro: alert & orientedx3, cranial nerves grossly intact. Moves all 4 extremities w/o difficulty. Affect pleasant.  ASSESSMENT & PLAN:  1. Chronic systolic CHF: Ischemic cardiomyopathy s/p Medtronic CRT-D, EF 20-25% ( Echo 04/2015).  She follows in the transplant clinic at [facility] q 6 months. She is blood type O so matching for transplant may be difficult.  Would consider LVAD prior to transplant. She has been doing very well off milrinone, thought to possible be 2/2 CRT upgrade.  NYHA class II-early III symptoms.  - Volume status stable on exam and Optivol. ReDS vest reading 27. Continue lasix 80 daily. With an extra 80 mg and metolazone as needed for fluid up.  BMET today.  - CPX 6/17 slightly worse than previous but relatively stable.  - Continue losartan 25 bid - Not good candidate for Entresto with previous failed med titrations and AKIs. - Continue carvedilol 3.125 BID. (Has previously failed titration)  - Reinforced the need and importance of daily weights, a low sodium diet, and fluid restriction (less than 2 L a day). Instructed to call the HF clinic if weight increases more than 3 lbs overnight or 5 lbs in a week.  2. CAD: No chest pain.  Continue ASA and statin.  3. Mitral regurgitation: s/p mitral valve annuloplasty with CABG in 2007. Moderate to severe MR on echo in 4/15. TEE (06/2013) mitral regurgitation appeared severe, anatomy of repaired valve does not look suitable for MV clipping and would be high risk for MV replacement. Moderate MR  on echo 2/17 4. CKD stage III:  - Recheck CMET today.  5.  NSVT:  - Continue amiodarone 100 mg. CMET today. TSH stable last visit.  Due for eye  exam, upcoming this month.   6. Obesity - Body mass index is 33.88 kg/m. Needs to keep weight down. If BMI > 35 will not be transplant candidate  - Needs to join an exercise program. Will investigate [facility] and ?cost?   7. PAD:  - Now having more claudication symptoms.  - Right mid SFA stenosis per [place] 08/30/14.  - Should check ABIs 8. Colon polyps -- Had colonoscopy 06/08/14 with 12 polyps -->Plan for colonoscopy every 2 years.    9. ? Thyroid Enlargment - TSH and Free T4 stable last visit.    CMET today. Follow up 4 months.   Shirley Friar PA-C 10/15/15   Patient seen and examined with Oda Kilts, PA-C. We discussed all aspects of the encounter. I agree with the assessment and plan as stated above.   Clinically stable. NYHA II-III. CPX reviewed personally with her. It is slightly worse and she continues to approach the window for advanced therapies but probably isn't there yet. She has failed multiple attempts at med titration. Will continue current regimen. Stressed need to f/u at [facility] for possible transplant listing. ICD interrogation done personally. NO VT/AF. Optivol ok.  Bensimhon, Daniel,MD 10:29 PM

## 2015-10-15 NOTE — Patient Instructions (Signed)
Labs today  We will contact you in 2 months to schedule your next appointment.  

## 2015-10-16 ENCOUNTER — Ambulatory Visit (INDEPENDENT_AMBULATORY_CARE_PROVIDER_SITE_OTHER): Payer: Medicare Other | Admitting: *Deleted

## 2015-10-16 DIAGNOSIS — I255 Ischemic cardiomyopathy: Secondary | ICD-10-CM | POA: Diagnosis not present

## 2015-10-16 DIAGNOSIS — I472 Ventricular tachycardia, unspecified: Secondary | ICD-10-CM

## 2015-10-16 NOTE — Progress Notes (Signed)
Remote ICD transmission.   

## 2015-10-17 ENCOUNTER — Encounter: Payer: Self-pay | Admitting: Cardiology

## 2015-10-23 DIAGNOSIS — I2510 Atherosclerotic heart disease of native coronary artery without angina pectoris: Secondary | ICD-10-CM | POA: Diagnosis not present

## 2015-10-23 DIAGNOSIS — Z95810 Presence of automatic (implantable) cardiac defibrillator: Secondary | ICD-10-CM | POA: Diagnosis not present

## 2015-10-23 DIAGNOSIS — Z794 Long term (current) use of insulin: Secondary | ICD-10-CM | POA: Diagnosis not present

## 2015-10-23 DIAGNOSIS — Z951 Presence of aortocoronary bypass graft: Secondary | ICD-10-CM | POA: Diagnosis not present

## 2015-10-23 DIAGNOSIS — I5022 Chronic systolic (congestive) heart failure: Secondary | ICD-10-CM | POA: Diagnosis not present

## 2015-10-23 DIAGNOSIS — E119 Type 2 diabetes mellitus without complications: Secondary | ICD-10-CM | POA: Diagnosis not present

## 2015-10-23 DIAGNOSIS — I739 Peripheral vascular disease, unspecified: Secondary | ICD-10-CM | POA: Diagnosis not present

## 2015-10-24 ENCOUNTER — Encounter: Payer: Self-pay | Admitting: Internal Medicine

## 2015-10-31 ENCOUNTER — Encounter: Payer: Self-pay | Admitting: Cardiology

## 2015-11-01 ENCOUNTER — Other Ambulatory Visit: Payer: Self-pay | Admitting: Internal Medicine

## 2015-11-01 MED ORDER — DICLOFENAC SODIUM 1 % TD GEL: 2.0000 g | Freq: Four times a day (QID) | TRANSDERMAL | 2 refills | Status: DC | PRN

## 2015-11-01 NOTE — Telephone Encounter (Signed)
Pt asking for refill of voltern gel  [facility]

## 2015-11-02 ENCOUNTER — Encounter (HOSPITAL_COMMUNITY): Payer: Self-pay | Admitting: Internal Medicine

## 2015-11-06 LAB — CUP PACEART REMOTE DEVICE CHECK
Battery Remaining Longevity: 14 mo
Battery Voltage: 2.92 V
Brady Statistic AP VS Percent: 0.05 %
Brady Statistic AS VS Percent: 1.61 %
Brady Statistic RA Percent Paced: 3.4 %
Brady Statistic RV Percent Paced: 97.86 %
Date Time Interrogation Session: 20170815062824
HighPow Impedance: 41 Ohm
HighPow Impedance: 51 Ohm
Implantable Lead Implant Date: 20151002
Implantable Lead Location: 753859
Implantable Lead Model: 4396
Implantable Lead Model: 5076
Lead Channel Impedance Value: 551 Ohm
Lead Channel Pacing Threshold Amplitude: 0.5 V
Lead Channel Pacing Threshold Pulse Width: 0.4 ms
Lead Channel Sensing Intrinsic Amplitude: 19.25 mV
Lead Channel Setting Pacing Amplitude: 2 V
Lead Channel Setting Pacing Pulse Width: 0.4 ms
Lead Channel Setting Pacing Pulse Width: 1 ms
Lead Channel Setting Sensing Sensitivity: 0.45 mV
MDC IDC LEAD IMPLANT DT: 20111012
MDC IDC LEAD IMPLANT DT: 20151002
MDC IDC LEAD LOCATION: 753857
MDC IDC LEAD LOCATION: 753860
MDC IDC MSMT LEADCHNL LV IMPEDANCE VALUE: 513 Ohm
MDC IDC MSMT LEADCHNL LV IMPEDANCE VALUE: 950 Ohm
MDC IDC MSMT LEADCHNL LV PACING THRESHOLD AMPLITUDE: 4.25 V
MDC IDC MSMT LEADCHNL LV PACING THRESHOLD PULSEWIDTH: 1 ms
MDC IDC MSMT LEADCHNL RA IMPEDANCE VALUE: 437 Ohm
MDC IDC MSMT LEADCHNL RA SENSING INTR AMPL: 1.375 mV
MDC IDC MSMT LEADCHNL RA SENSING INTR AMPL: 1.375 mV
MDC IDC MSMT LEADCHNL RV IMPEDANCE VALUE: 513 Ohm
MDC IDC MSMT LEADCHNL RV IMPEDANCE VALUE: 570 Ohm
MDC IDC MSMT LEADCHNL RV PACING THRESHOLD AMPLITUDE: 0.75 V
MDC IDC MSMT LEADCHNL RV PACING THRESHOLD PULSEWIDTH: 0.4 ms
MDC IDC MSMT LEADCHNL RV SENSING INTR AMPL: 19.25 mV
MDC IDC SET LEADCHNL LV PACING AMPLITUDE: 4.5 V
MDC IDC SET LEADCHNL RV PACING AMPLITUDE: 2.5 V
MDC IDC STAT BRADY AP VP PERCENT: 3.35 %
MDC IDC STAT BRADY AS VP PERCENT: 94.98 %

## 2015-11-12 ENCOUNTER — Encounter: Payer: Self-pay | Admitting: *Deleted

## 2015-11-15 NOTE — Progress Notes (Signed)
This encounter was created in error - please disregard.

## 2015-11-17 ENCOUNTER — Other Ambulatory Visit: Payer: Self-pay | Admitting: Internal Medicine

## 2015-11-20 DIAGNOSIS — I2510 Atherosclerotic heart disease of native coronary artery without angina pectoris: Secondary | ICD-10-CM | POA: Diagnosis not present

## 2015-11-20 DIAGNOSIS — Z01818 Encounter for other preprocedural examination: Secondary | ICD-10-CM | POA: Diagnosis not present

## 2015-11-20 DIAGNOSIS — Z87891 Personal history of nicotine dependence: Secondary | ICD-10-CM | POA: Diagnosis not present

## 2015-11-20 DIAGNOSIS — R911 Solitary pulmonary nodule: Secondary | ICD-10-CM | POA: Diagnosis not present

## 2015-11-20 DIAGNOSIS — I739 Peripheral vascular disease, unspecified: Secondary | ICD-10-CM | POA: Diagnosis not present

## 2015-11-21 DIAGNOSIS — E2839 Other primary ovarian failure: Secondary | ICD-10-CM | POA: Diagnosis not present

## 2015-11-21 DIAGNOSIS — Z01810 Encounter for preprocedural cardiovascular examination: Secondary | ICD-10-CM | POA: Diagnosis not present

## 2015-11-21 DIAGNOSIS — N281 Cyst of kidney, acquired: Secondary | ICD-10-CM | POA: Diagnosis not present

## 2015-11-21 DIAGNOSIS — K824 Cholesterolosis of gallbladder: Secondary | ICD-10-CM | POA: Diagnosis not present

## 2015-11-22 DIAGNOSIS — Z01810 Encounter for preprocedural cardiovascular examination: Secondary | ICD-10-CM | POA: Diagnosis not present

## 2015-11-22 DIAGNOSIS — I509 Heart failure, unspecified: Secondary | ICD-10-CM | POA: Diagnosis not present

## 2015-11-26 DIAGNOSIS — E119 Type 2 diabetes mellitus without complications: Secondary | ICD-10-CM | POA: Diagnosis not present

## 2015-11-26 DIAGNOSIS — G453 Amaurosis fugax: Secondary | ICD-10-CM | POA: Diagnosis not present

## 2015-11-26 LAB — HM DIABETES EYE EXAM

## 2015-11-27 ENCOUNTER — Ambulatory Visit: Payer: Self-pay

## 2015-11-27 ENCOUNTER — Ambulatory Visit (INDEPENDENT_AMBULATORY_CARE_PROVIDER_SITE_OTHER): Payer: Medicare Other | Admitting: Internal Medicine

## 2015-11-27 VITALS — BP 115/60 | HR 59 | Temp 98.4°F | Ht 64.0 in | Wt 201.9 lb

## 2015-11-27 DIAGNOSIS — M25552 Pain in left hip: Secondary | ICD-10-CM | POA: Diagnosis not present

## 2015-11-27 DIAGNOSIS — M79605 Pain in left leg: Secondary | ICD-10-CM | POA: Diagnosis present

## 2015-11-27 DIAGNOSIS — Z87891 Personal history of nicotine dependence: Secondary | ICD-10-CM

## 2015-11-27 DIAGNOSIS — R6889 Other general symptoms and signs: Secondary | ICD-10-CM

## 2015-11-27 DIAGNOSIS — M79604 Pain in right leg: Secondary | ICD-10-CM

## 2015-11-27 DIAGNOSIS — M79671 Pain in right foot: Secondary | ICD-10-CM

## 2015-11-27 DIAGNOSIS — Z794 Long term (current) use of insulin: Secondary | ICD-10-CM | POA: Diagnosis not present

## 2015-11-27 DIAGNOSIS — R7989 Other specified abnormal findings of blood chemistry: Secondary | ICD-10-CM

## 2015-11-27 DIAGNOSIS — M79672 Pain in left foot: Secondary | ICD-10-CM

## 2015-11-27 DIAGNOSIS — Z6834 Body mass index (BMI) 34.0-34.9, adult: Secondary | ICD-10-CM

## 2015-11-27 DIAGNOSIS — E119 Type 2 diabetes mellitus without complications: Secondary | ICD-10-CM

## 2015-11-27 DIAGNOSIS — R946 Abnormal results of thyroid function studies: Secondary | ICD-10-CM

## 2015-11-27 DIAGNOSIS — E1142 Type 2 diabetes mellitus with diabetic polyneuropathy: Secondary | ICD-10-CM | POA: Diagnosis not present

## 2015-11-27 DIAGNOSIS — M25551 Pain in right hip: Secondary | ICD-10-CM

## 2015-11-27 MED ORDER — DICLOFENAC SODIUM 1 % TD GEL: 2.0000 g | Freq: Four times a day (QID) | TRANSDERMAL | 2 refills | Status: DC | PRN

## 2015-11-27 MED ORDER — AMITRIPTYLINE HCL 25 MG PO TABS: 25.0000 mg | ORAL_TABLET | Freq: Every day | ORAL | 0 refills | Status: DC

## 2015-11-27 NOTE — Patient Instructions (Addendum)
Thank you for coming to see me today. It was a pleasure. Today we talked about:   Leg and foot pain: - I have refilled your Voltaren gel - we will start a new medication called amitriptyline for your diabetic nerve pain - we will also check some labs today to make sure we are not missing anything.   Please follow-up with [place] in 2 weeks to reassess how you are doing.  If you have any questions or concerns, please do not hesitate to call the office at [phone].  Take Care,   Jule Ser, DO Diabetic Neuropathy Diabetic neuropathy is a nerve disease or nerve damage that is caused by diabetes mellitus. About half of all people with diabetes mellitus have some form of nerve damage. Nerve damage is more common in those who have had diabetes mellitus for many years and who generally have not had good control of their blood sugar (glucose) level. Diabetic neuropathy is a common complication of diabetes mellitus. There are three common types of diabetic neuropathy and a fourth type that is less common and less understood:   Peripheral neuropathy--This is the most common type of diabetic neuropathy. It causes damage to the nerves of the feet and legs first and then eventually the hands and arms.The damage affects the ability to sense touch.  Autonomic neuropathy--This type causes damage to the autonomic nervous system, which controls the following functions:  Heartbeat.  Body temperature.  Blood pressure.  Urination.  Digestion.  Sweating.  Sexual function.  Focal neuropathy--Focal neuropathy can be painful and unpredictable and occurs most often in older adults with diabetes mellitus. It involves a specific nerve or one area and often comes on suddenly. It usually does not cause long-term problems.  Radiculoplexus neuropathy-- Sometimes called lumbosacral radiculoplexus neuropathy, radiculoplexus neuropathy affects the nerves of the thighs, hips, buttocks, or legs. It is more  common in people with type 2 diabetes mellitus and in older men. It is characterized by debilitating pain, weakness, and atrophy, usually in the thigh muscles. CAUSES  The cause of peripheral, autonomic, and focal neuropathies is diabetes mellitus that is uncontrolled and high glucose levels. The cause of radiculoplexus neuropathy is unknown. However, it is thought to be caused by inflammation related to uncontrolled glucose levels. SIGNS AND SYMPTOMS  Peripheral Neuropathy Peripheral neuropathy develops slowly over time. When the nerves of the feet and legs no longer work there may be:   Burning, stabbing, or aching pain in the legs or feet.  Inability to feel pressure or pain in your feet. This can lead to:  Thick calluses over pressure areas.  Pressure sores.  Ulcers.  Foot deformities.  Reduced ability to feel temperature changes.  Muscle weakness. Autonomic Neuropathy The symptoms of autonomic neuropathy vary depending on which nerves are affected. Symptoms may include:  Problems with digestion, such as:  Feeling sick to your stomach (nausea).  Vomiting.  Bloating.  Constipation.  Diarrhea.  Abdominal pain.  Difficulty with urination. This occurs if you lose your ability to sense when your bladder is full. Problems include:  Urine leakage (incontinence).  Inability to empty your bladder completely (retention).  Rapid or irregular heartbeat (palpitations).  Blood pressure drops when you stand up (orthostatic hypotension). When you stand up you may feel:  Dizzy.  Weak.  Faint.  In men, inability to attain and maintain an erection.  In women, vaginal dryness and problems with decreased sexual desire and arousal.  Problems with body temperature regulation.  Increased or  decreased sweating. Focal Neuropathy  Abnormal eye movements or abnormal alignment of both eyes.  Weakness in the wrist.  Foot drop. This results in an inability to lift the foot  properly and abnormal walking or foot movement.  Paralysis on one side of your face (Bell palsy).  Chest or abdominal pain. Radiculoplexus Neuropathy  Sudden, severe pain in your hip, thigh, or buttocks.  Weakness and wasting of thigh muscles.  Difficulty rising from a seated position.  Abdominal swelling.  Unexplained weight loss (usually more than 10 lb [4.5 kg]). DIAGNOSIS  Peripheral Neuropathy Your senses may be tested. Sensory function testing can be done with:  A light touch using a monofilament.  A vibration with tuning fork.  A sharp sensation with a pin prick. Other tests that can help diagnose neuropathy are:  Nerve conduction velocity. This test checks the transmission of an electrical current through a nerve.  Electromyography. This shows how muscles respond to electrical signals transmitted by nearby nerves.  Quantitative sensory testing. This is used to assess how your nerves respond to vibrations and changes in temperature. Autonomic Neuropathy Diagnosis is often based on reported symptoms. Tell your health care provider if you experience:   Dizziness.   Constipation.   Diarrhea.   Inappropriate urination or inability to urinate.   Inability to get or maintain an erection.  Tests that may be done include:   Electrocardiography or Holter monitor. These are tests that can help show problems with the heart rate or heart rhythm.   An X-ray exam may be done. Focal Neuropathy Diagnosis is made based on your symptoms and what your health care provider finds during your exam. Other tests may be done. They may include:  Nerve conduction velocities. This checks the transmission of electrical current through a nerve.  Electromyography. This shows how muscles respond to electrical signals transmitted by nearby nerves.  Quantitative sensory testing. This test is used to assess how your nerves respond to vibration and changes in  temperature. Radiculoplexus Neuropathy  Often the first thing is to eliminate any other issue or problems that might be the cause, as there is no stick test for diagnosis.  X-ray exam of your spine and lumbar region.  Spinal tap to rule out cancer.  MRI to rule out other lesions. TREATMENT  Once nerve damage occurs, it cannot be reversed. The goal of treatment is to keep the disease or nerve damage from getting worse and affecting more nerve fibers. Controlling your blood glucose level is the key. Most people with radiculoplexus neuropathy see at least a partial improvement over time. You will need to keep your blood glucose and HbA1c levels in the target range determined by your health care provider. Things that help control blood glucose levels include:   Blood glucose monitoring.   Meal planning.   Physical activity.   Diabetes medicine.  Over time, maintaining lower blood glucose levels helps lessen symptoms. Sometimes, prescription pain medicine is needed. HOME CARE INSTRUCTIONS:  Do not smoke.  Keep your blood glucose level in the range that you and your health care provider have determined acceptable for you.  Keep your blood pressure level in the range that you and your health care provider have determined acceptable for you.  Eat a well-balanced diet.  Be physically active every day. Include strength training and balance exercises.  Protect your feet.  Check your feet every day for sores, cuts, blisters, or signs of infection.  Wear padded socks and supportive shoes. Use  orthotic inserts, if necessary.  Regularly check the insides of your shoes for worn spots. Make sure there are no rocks or other items inside your shoes before you put them on. SEEK MEDICAL CARE IF:   You have burning, stabbing, or aching pain in the legs or feet.  You are unable to feel pressure or pain in your feet.  You develop problems with digestion such  as:  Nausea.  Vomiting.  Bloating.  Constipation.  Diarrhea.  Abdominal pain.  You have difficulty with urination, such as:  Incontinence.  Retention.  You have palpitations.  You develop orthostatic hypotension. When you stand up you may feel:  Dizzy.  Weak.  Faint.  You cannot attain and maintain an erection (in men).  You have vaginal dryness and problems with decreased sexual desire and arousal (in women).  You have severe pain in your thighs, legs, or buttocks.  You have unexplained weight loss.   This information is not intended to replace advice given to you by your health care provider. Make sure you discuss any questions you have with your health care provider.   Document Released: 04/28/2001 Document Revised: 03/10/2014 Document Reviewed: 07/29/2012 Elsevier Interactive Patient Education [employer].

## 2015-11-27 NOTE — Assessment & Plan Note (Signed)
A: Her symptoms seem most consistent with diabetic neuropathy pain.  She denies claudication symptoms given her hx of PAD, however, lower extremity pain sounds more neuropathic at this point.  She did not tolerate gabapentin as prescribed by her cardiologist.  Her sugars have been better controlled on the Lantus 32 units that her cardiologist increased.  P: - we will trial her on amitriptyline '25mg'$  QHS for treatment of her neuropathy.   - will also check a B12, RPR, and TSH - she will follow up as already scheduled with her PCP on Oct 18

## 2015-11-27 NOTE — Assessment & Plan Note (Signed)
A: Patient reports continued bilateral hip pain for which she liberally uses Voltaren gel.  She is requesting a refill today of this medication.  She uses Tylenol as needed as well for this.  Her Body mass index is 34.66 kg/m.  Prior X-Ray of the left hip shows degenerative changes. Her symptoms are most consistent with osteoarthritis.  It is pain that occurs in the morning when she is getting out of bed. It is not constant, but on and off. She says the pain improves shortly after she starts moving around. Her main complaint today is her feet and leg pain more consistent with DM neuropathy.  P: - continue Voltaren gel and Tylenol as needed. - consider PT evaluation and treatment if symptoms are worsening on follow up.

## 2015-11-27 NOTE — Progress Notes (Addendum)
CC: feet and leg pain  HPI:  [name] is a 60 y.o. woman with hx of HTN, DM (A1c 7.9 in July), prior substance abuse, CAD s/p CABG, HFrEF, CKD III, and PAD.  She is currently being evaluated at [facility] for heart transplant.  She is here today due to pain in her feet and legs as well as hip pain for which she uses Voltaren gel liberally.  She reports the past 2-3 months a pins and needles with burning sensation in her bilateral feet and legs.  The pain stays fairly constant.  She has been using her Voltaren gel more liberally to control her pain which she states helps a little bit.  She thinks her symptoms coincided with her sugars being less controlled a couple months back, but her cardiologist at [facility] increased her Lantus to 32 units and this helped her sugars but has not improved her pain.  Her cardiologist also prescribed gabapentin which patient reports taking for a week and then stopping due to complaint of dizziness.  Last TSH was normal in May 2017.  No record in results review of B12 or RPR.  HIV negative in 2015.  She denies claudication type symptoms currently.  No chest pain or SOB.  No fevers or chills.  Past Medical History:  Diagnosis Date  . AICD (automatic cardioverter/defibrillator) present   . Chronic systolic heart failure ([place])    a. ICM b. ECHO (06/2013): EF 15%, severe MR (06/2013) c. RHC (10/2013): RA 5, RV 23/2/5, PA 25/6 (14), PCWP 12, Fick CO/CI: 3.2/1.7, PVR 0.7 WU, PA 45%, 47% and 55% (with levophed 5), vagal response during cath. d. On home milrinone.  . CKD (chronic kidney disease), stage III   . Coronary artery disease    a. s/p CABG x 5 with MV annuloplasty 2007   . Diabetes mellitus   . Hypertension   . Implantable defibrillator   medtronic   . Ischemic cardiomyopathy    a. s/p ICD. b. LHC (10/04/13): 1. Severe native CAD with all bypass grafts patent. c. CRT upgrade 12/2013.  [name] LBBB (left bundle branch block)   . Lipoma   . PAD (peripheral artery disease)  ([place])   . PICC line infection    a. 01/2014 - PICC exchanged.  . Polysubstance abuse    history of  (cocaine, tobacco and ETOH)  . V-tach [facility])     Review of Systems:   Please see pertinent ROS reviewed in HPI and problem based charting.   Physical Exam:  Vitals:   11/27/15 0906  BP: 115/60  Pulse: (!) 59  Temp: 98.4 F (36.9 C)  TempSrc: Oral  SpO2: 99%  Weight: 201 lb 14.4 oz (91.6 kg)  Height: '5\' 4"'$  (1.626 m)   Physical Exam  Constitutional: She is well-developed, well-nourished, and in no distress.  HENT:  Head: Normocephalic and atraumatic.  Pulmonary/Chest: Effort normal.  Musculoskeletal: She exhibits no edema.  Skin: Skin is warm and dry.  Psychiatric: Mood and affect normal.     Assessment & Plan:   See Encounters Tab for problem based charting.  Patient discussed with Dr. Daryll Drown.  Hip pain, bilateral A: Patient reports continued bilateral hip pain for which she liberally uses Voltaren gel.  She is requesting a refill today of this medication.  She uses Tylenol as needed as well for this.  Her Body mass index is 34.66 kg/m.  Prior X-Ray of the left hip shows degenerative changes. Her symptoms are most consistent  with osteoarthritis.  It is pain that occurs in the morning when she is getting out of bed. It is not constant, but on and off. She says the pain improves shortly after she starts moving around. Her main complaint today is her feet and leg pain more consistent with DM neuropathy.  P: - continue Voltaren gel and Tylenol as needed. - consider PT evaluation and treatment if symptoms are worsening on follow up.  Diabetic neuropathy A: Her symptoms seem most consistent with diabetic neuropathy pain.  She denies claudication symptoms given her hx of PAD, however, lower extremity pain sounds more neuropathic at this point.  She did not tolerate gabapentin as prescribed by her cardiologist.  Her sugars have been better controlled on the Lantus 32 units that  her cardiologist increased.  P: - we will trial her on amitriptyline '25mg'$  QHS for treatment of her neuropathy.   - will also check a B12, RPR, and TSH - she will follow up as already scheduled with her PCP on Oct 18          Elevated TSH A: Patients TSH was mildly elevated at 4.8.  P: - will check a free T4 and T3

## 2015-11-27 NOTE — Progress Notes (Signed)
Internal Medicine Clinic Attending  Case discussed with Dr. Wallace at the time of the visit.  We reviewed the resident's history and exam and pertinent patient test results.  I agree with the assessment, diagnosis, and plan of care documented in the resident's note.  

## 2015-11-28 ENCOUNTER — Other Ambulatory Visit (HOSPITAL_COMMUNITY): Payer: Self-pay | Admitting: Internal Medicine

## 2015-11-28 DIAGNOSIS — T462X1A Poisoning by other antidysrhythmic drugs, accidental (unintentional), initial encounter: Secondary | ICD-10-CM

## 2015-11-28 DIAGNOSIS — E032 Hypothyroidism due to medicaments and other exogenous substances: Secondary | ICD-10-CM | POA: Insufficient documentation

## 2015-11-28 LAB — RPR: RPR: NONREACTIVE

## 2015-11-28 LAB — TSH: TSH: 4.8 u[IU]/mL — AB (ref 0.450–4.500)

## 2015-11-28 LAB — VITAMIN B12: VITAMIN B 12: 843 pg/mL (ref 211–946)

## 2015-11-28 NOTE — Addendum Note (Signed)
Addended by: Mignon Pine on: 11/28/2015 01:46 PM   Modules accepted: Orders

## 2015-11-28 NOTE — Assessment & Plan Note (Addendum)
A: Patients TSH was mildly elevated at 4.8.  P: - will check a free T4 and T3   Addendum 8:46 AM 11/29/2015: - free T4 and T3 were both within normal range indicating subclinical hypothyroidism.  Given lack of significant symptoms of hypothyroidism would probably recommend repeating TSH in 6 months as opposed to treating

## 2015-11-29 LAB — SPECIMEN STATUS REPORT

## 2015-11-29 LAB — T4, FREE: Free T4: 1.44 ng/dL (ref 0.82–1.77)

## 2015-11-29 LAB — T3: T3, Total: 107 ng/dL (ref 71–180)

## 2015-12-14 NOTE — Progress Notes (Signed)
[facility] [employer] PRE[employer] Weekly Session   Patient Details  Name: [name] MRN: [id number] Date of Birth: Dec 22, 1955 Age: 60 y.o. PCP: Zada Finders, MD  Vitals:   12/14/15 1042  Weight: 200 lb 3.2 oz (90.8 kg)        [employer] Weekly seesion - 12/14/15 1000      Weekly Session   Topic Discussed Hitting roadblocks  guest speaker-Al   Minutes exercised this week 45 minutes  all cardio   Classes attended to date 1   Comments states she ate no ice cream all week!       Vanita Ingles 12/14/2015, 10:43 AM

## 2015-12-17 ENCOUNTER — Other Ambulatory Visit (HOSPITAL_COMMUNITY): Payer: Self-pay | Admitting: *Deleted

## 2015-12-17 MED ORDER — FUROSEMIDE 80 MG PO TABS: 80.0000 mg | ORAL_TABLET | Freq: Every day | ORAL | 3 refills | Status: DC

## 2015-12-17 NOTE — Progress Notes (Signed)
Spoke w/[name] by phone.  She has recently gotten some caregiving jobs and isn't able to make the weekly class or exercise regularly.  She would like to place her PREP membership on hold and restart when these jobs are finished.  She anticipates about a month.  I will follow up with her in a month and help her restart when she is able.

## 2015-12-18 ENCOUNTER — Telehealth: Payer: Self-pay | Admitting: Internal Medicine

## 2015-12-18 NOTE — Telephone Encounter (Signed)
APT. REMINDER CALL, LMTCB °

## 2015-12-19 ENCOUNTER — Encounter: Payer: Self-pay | Admitting: Internal Medicine

## 2015-12-19 ENCOUNTER — Ambulatory Visit (INDEPENDENT_AMBULATORY_CARE_PROVIDER_SITE_OTHER): Payer: Medicare Other | Admitting: Internal Medicine

## 2015-12-19 VITALS — BP 107/58 | Temp 98.2°F | Wt 200.7 lb

## 2015-12-19 DIAGNOSIS — Z79899 Other long term (current) drug therapy: Secondary | ICD-10-CM | POA: Diagnosis not present

## 2015-12-19 DIAGNOSIS — I5022 Chronic systolic (congestive) heart failure: Secondary | ICD-10-CM

## 2015-12-19 DIAGNOSIS — E118 Type 2 diabetes mellitus with unspecified complications: Secondary | ICD-10-CM

## 2015-12-19 DIAGNOSIS — Z7982 Long term (current) use of aspirin: Secondary | ICD-10-CM

## 2015-12-19 DIAGNOSIS — E1140 Type 2 diabetes mellitus with diabetic neuropathy, unspecified: Secondary | ICD-10-CM

## 2015-12-19 DIAGNOSIS — Z794 Long term (current) use of insulin: Secondary | ICD-10-CM

## 2015-12-19 DIAGNOSIS — Z87891 Personal history of nicotine dependence: Secondary | ICD-10-CM | POA: Diagnosis not present

## 2015-12-19 DIAGNOSIS — Z0000 Encounter for general adult medical examination without abnormal findings: Secondary | ICD-10-CM

## 2015-12-19 DIAGNOSIS — Z23 Encounter for immunization: Secondary | ICD-10-CM | POA: Diagnosis present

## 2015-12-19 DIAGNOSIS — E1142 Type 2 diabetes mellitus with diabetic polyneuropathy: Secondary | ICD-10-CM

## 2015-12-19 LAB — GLUCOSE, CAPILLARY: Glucose-Capillary: 134 mg/dL — ABNORMAL HIGH (ref 65–99)

## 2015-12-19 LAB — POCT GLYCOSYLATED HEMOGLOBIN (HGB A1C): HEMOGLOBIN A1C: 7.5

## 2015-12-19 MED ORDER — AMITRIPTYLINE HCL 50 MG PO TABS: 50.0000 mg | ORAL_TABLET | Freq: Every day | ORAL | 2 refills | Status: DC

## 2015-12-19 MED ORDER — CAPSAICIN 0.075 % EX CREA: 1.0000 "application " | TOPICAL_CREAM | Freq: Two times a day (BID) | CUTANEOUS | 0 refills | Status: DC

## 2015-12-19 NOTE — Assessment & Plan Note (Signed)
Patient given flu shot and 3rd in Hepatitis B vaccine series today.

## 2015-12-19 NOTE — Assessment & Plan Note (Signed)
Last HgbA1c was 7.9 on 09/19/15. Patient currently takes Lantus 32 units qhs and Januvia 50 mg daily. She checks her CBGs twice a day before breakfast and dinner and sees most numbers between 130-190. She denies any symptomatic highs or lows. She reports having her annual eye exam last month.  Repeat hgb A1c today is 7.5. Patient's diabetes is well controlled. I am okay with a goal a1c of 7.0 to 7.5. Would caution more aggressive control due to risk for hypoglycemia. -Continue Lantus 32 units qhs -Continue Januvia 50 mg daily -Repeat Hgb a1c in 3 months -Continue lifestyle modifications

## 2015-12-19 NOTE — Progress Notes (Signed)
CC: T2DM  HPI:  [name] is a 60 y.o. female with PMH as listed below who presents for follow up management of her T2DM.  T2DM: Last HgbA1c was 7.9 on 09/19/15. Patient currently takes Lantus 32 units qhs and Januvia 50 mg daily. She checks her CBGs twice a day before breakfast and dinner and sees most numbers between 130-190. She denies any symptomatic highs or lows. She reports having her annual eye exam last month.   Chronic systolic heart failure: Patient follows with Dr. Sung Fields as well as the Heart Transplant clinic in [facility]. She takes Amiodarone 100 mg daily, Digoxin 0.0625 mg every other day, Losartan 80 mg daily, Spironolactone 25 mg daily, Lasix 80 mg daily, Lipitor 80 mg daily, and Metolazone 2.5 mg as needed. She checks her weight at home with usual weight between 195-197. She denies any CP, palpitations, dyspnea, orthopnea. She limits sodium and fluid intake. She was going to the [facility] to exercise but slowed down as she was having chest soreness with a rowing machine. She walks her dog daily with up to 5000 steps per day.  Diabetic neuropathy: Patient with bilateral lower extremity tingling with pins and needles sensation, mostly in her feet. She has not tolerated Gabapentin due to dizziness. She was started on Amitryptyline 25 mg qhs last month for her neuropathy. B12 and RPR were normal. TSH was slightly elevated with normal T3 and T4 indication subclinical hypothyroidism. She has used voltaren gel with relief.  Healthcare maintenance:  Patient given flu shot and 3rd in Hepatitis B vaccine series today.    Past Medical History:  Diagnosis Date  . AICD (automatic cardioverter/defibrillator) present   . Chronic systolic heart failure ([place])    a. ICM b. ECHO (06/2013): EF 15%, severe MR (06/2013) c. RHC (10/2013): RA 5, RV 23/2/5, PA 25/6 (14), PCWP 12, Fick CO/CI: 3.2/1.7, PVR 0.7 WU, PA 45%, 47% and 55% (with levophed 5), vagal response during cath. d. On home  milrinone.  . CKD (chronic kidney disease), stage III   . Coronary artery disease    a. s/p CABG x 5 with MV annuloplasty 2007   . Diabetes mellitus   . Hypertension   . Implantable defibrillator   medtronic   . Ischemic cardiomyopathy    a. s/p ICD. b. LHC (10/04/13): 1. Severe native CAD with all bypass grafts patent. c. CRT upgrade 12/2013.  [name] LBBB (left bundle branch block)   . Lipoma   . PAD (peripheral artery disease) ([place])   . PICC line infection    a. 01/2014 - PICC exchanged.  . Polysubstance abuse    history of  (cocaine, tobacco and ETOH)  . V-tach [facility])     Review of Systems:   Review of Systems  Constitutional: Negative for chills, fever and malaise/fatigue.  Respiratory: Negative for shortness of breath and wheezing.   Cardiovascular: Negative for chest pain, palpitations and leg swelling.  Gastrointestinal: Negative for abdominal pain, blood in stool, constipation, diarrhea, nausea and vomiting.  Musculoskeletal: Negative for falls and myalgias.  Neurological: Positive for tingling. Negative for dizziness, sensory change, focal weakness and loss of consciousness.  Psychiatric/Behavioral: Negative for substance abuse.     Physical Exam:  Vitals:   12/19/15 1325  BP: (!) 107/58  Temp: 98.2 F (36.8 C)  TempSrc: Oral  SpO2: 98%  Weight: 200 lb 11.2 oz (91 kg)   Physical Exam  Constitutional: She is oriented to person, place, and time. She appears well-developed  and well-nourished. No distress.  Eyes: No scleral icterus.  Cardiovascular: Normal rate and regular rhythm.   Murmur heard. DP pulses +2 bilaterally. ICD left chest wall  Pulmonary/Chest: Effort normal. No respiratory distress. She has no wheezes. She has no rales.  Abdominal: Soft. She exhibits no distension. There is no tenderness.  Musculoskeletal: She exhibits no edema or tenderness.  Negative straight leg test. Surgical scar RLE  Neurological: She is alert and oriented to person, place, and  time.  Skin: Skin is warm. She is not diaphoretic.    Assessment & Plan:   See Encounters Tab for problem based charting.  Patient discussed with Dr. Evette Doffing

## 2015-12-19 NOTE — Patient Instructions (Addendum)
It was a pleasure to see you again [name].  You are doing a good job with your diabetes. Your Hgb A1c is improved to 7.5 today.  Continue your Lantus 32 units at night and Januvia.  I will increase your Amitryptyline to 50 mg at night for your nerve pain and add Capsaicin cream.  We are giving you the flu shot today and the third Hepatitis B vaccine today.  Please follow up with [place] in 3 months.

## 2015-12-19 NOTE — Assessment & Plan Note (Signed)
Patient with bilateral lower extremity tingling with pins and needles sensation, mostly in her feet. She has not tolerated Gabapentin due to dizziness. She was started on Amitryptyline 25 mg qhs last month for her neuropathy. B12 and RPR were normal. TSH was slightly elevated with normal T3 and T4 indication subclinical hypothyroidism. She has used voltaren gel with relief.  Patient with continued symptoms on low dose Amitriptyline. -Increase Amitriptyline to 50 mg qhs -Start Capsaicin cream

## 2015-12-19 NOTE — Assessment & Plan Note (Signed)
Patient follows with Dr. Sung Amabile as well as the Heart Transplant clinic in [facility]. She takes Amiodarone 100 mg daily, Digoxin 0.0625 mg every other day, Losartan 80 mg daily, Spironolactone 25 mg daily, Lasix 80 mg daily, Lipitor 80 mg daily, and Metolazone 2.5 mg as needed. She checks her weight at home with usual weight between 195-197. She denies any CP, palpitations, dyspnea, orthopnea. She limits sodium and fluid intake. She was going to the [facility] to exercise but slowed down as she was having chest soreness with a rowing machine. She walks her dog daily with up to 5000 steps per day.  Currently stable. Lungs are clear and no pedal edema on exam. Weight is stable. Continue current management, lifestyle modifications, and follow up with Heart Failure as planned.

## 2015-12-21 NOTE — Progress Notes (Signed)
Internal Medicine Clinic Attending  Case discussed with Dr. Patel,Vishal at the time of the visit.  We reviewed the resident's history and exam and pertinent patient test results.  I agree with the assessment, diagnosis, and plan of care documented in the resident's note.  

## 2015-12-25 ENCOUNTER — Telehealth: Payer: Self-pay

## 2015-12-25 NOTE — Telephone Encounter (Signed)
rtc to pt, could not leave message got a recording saying enter the remote access code

## 2015-12-25 NOTE — Telephone Encounter (Signed)
Needs to speak with a nurse regarding amitriptyline.

## 2015-12-27 ENCOUNTER — Ambulatory Visit: Payer: Self-pay

## 2016-01-07 ENCOUNTER — Other Ambulatory Visit: Payer: Self-pay | Admitting: Internal Medicine

## 2016-01-10 ENCOUNTER — Encounter (HOSPITAL_COMMUNITY): Payer: Self-pay | Admitting: Internal Medicine

## 2016-01-10 ENCOUNTER — Telehealth (HOSPITAL_COMMUNITY): Payer: Self-pay | Admitting: Vascular Surgery

## 2016-01-10 ENCOUNTER — Ambulatory Visit (HOSPITAL_COMMUNITY)
Admission: RE | Admit: 2016-01-10 | Discharge: 2016-01-10 | Disposition: A | Discharge status: Home / Self Care | Payer: Medicare Other | Source: Ambulatory Visit | Attending: Internal Medicine | Admitting: Internal Medicine

## 2016-01-10 VITALS — BP 110/68 | HR 70 | Wt 200.5 lb

## 2016-01-10 DIAGNOSIS — E1151 Type 2 diabetes mellitus with diabetic peripheral angiopathy without gangrene: Secondary | ICD-10-CM | POA: Diagnosis not present

## 2016-01-10 DIAGNOSIS — F1410 Cocaine abuse, uncomplicated: Secondary | ICD-10-CM | POA: Diagnosis not present

## 2016-01-10 DIAGNOSIS — I739 Peripheral vascular disease, unspecified: Secondary | ICD-10-CM | POA: Diagnosis not present

## 2016-01-10 DIAGNOSIS — I447 Left bundle-branch block, unspecified: Secondary | ICD-10-CM | POA: Insufficient documentation

## 2016-01-10 DIAGNOSIS — N183 Chronic kidney disease, stage 3 unspecified: Secondary | ICD-10-CM

## 2016-01-10 DIAGNOSIS — Z6834 Body mass index (BMI) 34.0-34.9, adult: Secondary | ICD-10-CM | POA: Diagnosis not present

## 2016-01-10 DIAGNOSIS — I472 Ventricular tachycardia: Secondary | ICD-10-CM | POA: Diagnosis not present

## 2016-01-10 DIAGNOSIS — I340 Nonrheumatic mitral (valve) insufficiency: Secondary | ICD-10-CM | POA: Insufficient documentation

## 2016-01-10 DIAGNOSIS — E669 Obesity, unspecified: Secondary | ICD-10-CM | POA: Diagnosis not present

## 2016-01-10 DIAGNOSIS — I130 Hypertensive heart and chronic kidney disease with heart failure and stage 1 through stage 4 chronic kidney disease, or unspecified chronic kidney disease: Secondary | ICD-10-CM | POA: Insufficient documentation

## 2016-01-10 DIAGNOSIS — E1122 Type 2 diabetes mellitus with diabetic chronic kidney disease: Secondary | ICD-10-CM | POA: Diagnosis not present

## 2016-01-10 DIAGNOSIS — Z95810 Presence of automatic (implantable) cardiac defibrillator: Secondary | ICD-10-CM | POA: Insufficient documentation

## 2016-01-10 DIAGNOSIS — R252 Cramp and spasm: Secondary | ICD-10-CM | POA: Diagnosis not present

## 2016-01-10 DIAGNOSIS — Z951 Presence of aortocoronary bypass graft: Secondary | ICD-10-CM | POA: Diagnosis not present

## 2016-01-10 DIAGNOSIS — I5022 Chronic systolic (congestive) heart failure: Secondary | ICD-10-CM | POA: Diagnosis not present

## 2016-01-10 DIAGNOSIS — I2510 Atherosclerotic heart disease of native coronary artery without angina pectoris: Secondary | ICD-10-CM | POA: Insufficient documentation

## 2016-01-10 DIAGNOSIS — Z794 Long term (current) use of insulin: Secondary | ICD-10-CM | POA: Diagnosis not present

## 2016-01-10 DIAGNOSIS — Z9889 Other specified postprocedural states: Secondary | ICD-10-CM | POA: Diagnosis not present

## 2016-01-10 DIAGNOSIS — Z7982 Long term (current) use of aspirin: Secondary | ICD-10-CM | POA: Insufficient documentation

## 2016-01-10 DIAGNOSIS — Z8249 Family history of ischemic heart disease and other diseases of the circulatory system: Secondary | ICD-10-CM | POA: Diagnosis not present

## 2016-01-10 DIAGNOSIS — I252 Old myocardial infarction: Secondary | ICD-10-CM | POA: Diagnosis not present

## 2016-01-10 DIAGNOSIS — I255 Ischemic cardiomyopathy: Secondary | ICD-10-CM | POA: Insufficient documentation

## 2016-01-10 DIAGNOSIS — Z86010 Personal history of colonic polyps: Secondary | ICD-10-CM | POA: Diagnosis not present

## 2016-01-10 LAB — COMPREHENSIVE METABOLIC PANEL
ALBUMIN: 4.7 g/dL (ref 3.5–5.0)
ALT: 45 U/L (ref 14–54)
ANION GAP: 14 (ref 5–15)
AST: 34 U/L (ref 15–41)
Alkaline Phosphatase: 73 U/L (ref 38–126)
BILIRUBIN TOTAL: 0.7 mg/dL (ref 0.3–1.2)
BUN: 45 mg/dL — ABNORMAL HIGH (ref 6–20)
CO2: 24 mmol/L (ref 22–32)
Calcium: 10.1 mg/dL (ref 8.9–10.3)
Chloride: 99 mmol/L — ABNORMAL LOW (ref 101–111)
Creatinine, Ser: 1.69 mg/dL — ABNORMAL HIGH (ref 0.44–1.00)
GFR calc Af Amer: 37 mL/min — ABNORMAL LOW (ref >60)
GFR, EST NON AFRICAN AMERICAN: 32 mL/min — AB (ref >60)
GLUCOSE: 202 mg/dL — AB (ref 65–99)
POTASSIUM: 3.8 mmol/L (ref 3.5–5.1)
Sodium: 137 mmol/L (ref 135–145)
TOTAL PROTEIN: 8.3 g/dL — AB (ref 6.5–8.1)

## 2016-01-10 LAB — CK: CK TOTAL: 229 U/L (ref 38–234)

## 2016-01-10 MED ORDER — AMOXICILLIN 500 MG PO CAPS: 2000.0000 mg | ORAL_CAPSULE | ORAL | 1 refills | Status: DC | PRN

## 2016-01-10 NOTE — Patient Instructions (Signed)
Labs today (will call for abnormal results, otherwise no news is good news)  Amoxicillin, take all 4 tabs 1 hour prior to dental work  Follow up in 2-3 months

## 2016-01-10 NOTE — Progress Notes (Signed)
Patient ID: [name], female   DOB: 10-Mar-1955, 60 y.o.   MRN: [id number]    Advanced Heart Failure Clinic Note  PCP: Dr. Trudee Kuster (Internal Medicine) Primary HF:  Dr. Haroldine Laws   HPI: 60 y.o. female with history of HTN, DM2, past polysubstance abuse (ETOH, tobacco, cocaine), CAD s/p CABG x5 with MV annuloplasty (2007), ischemic cardiomyopathy s/p Medtronic CRT-D, chronic systolic HF EF 35%, severe MR, CKD stage III and PAD. Blood type O+.  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 At time of cath LAD, LCX and RCA occluded. EF 45%. Had abrupt stent occlusion the next day and had to go back to the lab. 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   Amitted 8/2-10/07/13 for syncope s/p ICD shock. Concern that VT was possibly from ischemia and taken for [facility]. During cath patient had vagal response and BP dropped to 50s and co-ox was in the upper 40s. Placed on Levophed and transferred to floor. Started on Amiodarone.   On 11/25/13 underwent RHC for low output symptoms. Hemodynamic borderline. Swan left in and she was observed. Cardiac output dropped while in hospital so started on milrinone. Discharged home. Underwent CRT-D upgrade on 12/02/13  In 12/15, she was admitted with catheter infection. She was admitted and catheter was replaced.  She has completed antibiotic.    She has been titrated off milrinone. She has been off since January 2016.   She presents today for regular follow up. Doing pretty good. Weight stable. Son has been in [facility] with recurrent psychosis after drug use. She is tearful. Doing all ADLs without too much difficulty. Had transplant w/u at [facility] with Dr. Radene Knee in September and October 3017 and was told that she didn't need transplant yet. Fluid goes up when she eats out. Takes metolazone 1-2x/month. + leg cramps  Optivol: Fluid level up and down, with no recent crossings.  Pt activity ~ 4 hrs a day.  No VT/VF. No AT/AF. 100% V-pacing  CPX (6/17) peak VO2 13.3 (predicted peak 71%), VE/VCO2 slope 41, RER 1.19 OUES 1.15 (pVO2 corrects to 19.2 ml/kg/min for iBW)  05/19/2012  EF 15% 2/15 EF 15%, diffuse hypokinesis, restrictive diastolic function, s/p MV repair with moderate-severe MR.  4/15: EF 15%, severe MR, mod TR 2/17 EF 20-25%  CPX: 06/17/12 Peak VO2 14.8 (predicted peak VO2 68.5%), VE/VCO2 slope 43.4 OUES: 1.19, Peak RER 1.12 Vent threshold 11.3 (pred peak VO2 52.3%)  CPX (3/15): peak VO2 15.6 (predicted peak VO2 74.5%) VE/VO2 40.8, RER 1.2 CPX (6/16): peak VO2: 13.3 (68.6% predicted peak VO2) - corrects to 18.6 ml/kg/min for iBW   -VE/VCO2 slope: 33.7, OUES: 1.20, Peak RER: 1.12, VE/MVV: 48.4%,O2pulse: 11 (100% predicted O2pulse)   LHC (10/2013) 1) severe native CAD with all bypass grafts patent  11/01/13 RHC RA = 5  RV = 47/3/10  PA = 54/16 (30)  PCW = 14 v = 20  Fick cardiac output/index = 4.8/2.6  Thermo CO/CI = 3.7/2.0  PVR = 4.3 WU  FA sat = 95%  PA sat = 59%, 59%   Labs:  08/12/12 - on lipitor 40 mg daily Cholesterol 180 Triglyceride 195 HDL 29 LDL 112 K 4.5, creatinine 1.4 11/16/12 K 4.0 Labs 1.5 Pro BNP 181 02/14/13: K+ 4.6, Cr 1.33, Dig level 1.8, TSH 2.97 3/15: K 4.2, creatinine 1.07, HCT 32.4 06/2013: Dig level 0.3, pro-BNP 899, Cholesterol 140, TG  106, HDL 36, LDL 82, Cr 1.5, K+ 4.9 11/11/13: K 4.1 Creatinine 1.8  01/31/14 Creatinine 1.48 K .5  02/07/14:  K 4.2 Creatinine 1.75 Magnesium 2.2 after sprionolactone 12.5 mg  was added  12/15: K 3.8, creatinine 1.32  04/04/2014: K 4.8 Creatinine 1.59 06/08/2014: K 3.3 Creatinine 1.47  04/2015: K 4.5 Creatinine 1.65 05/04/15: K 4.3 creatinine 1.45  FH: Mother deceased: CAD, DM2, HTN        Father deceased: stroke  SH: Works odds/end jobs for [employer]; disabled. Lives in [place] with 2 sons([name] and [name]).   ROS: All systems negative except as listed in HPI, PMH and Problem  List.  Past Medical History:  Diagnosis Date  . AICD (automatic cardioverter/defibrillator) present   . Chronic systolic heart failure ([place])    a. ICM b. ECHO (06/2013): EF 15%, severe MR (06/2013) c. RHC (10/2013): RA 5, RV 23/2/5, PA 25/6 (14), PCWP 12, Fick CO/CI: 3.2/1.7, PVR 0.7 WU, PA 45%, 47% and 55% (with levophed 5), vagal response during cath. d. On home milrinone.  . CKD (chronic kidney disease), stage III   . Coronary artery disease    a. s/p CABG x 5 with MV annuloplasty 2007   . Diabetes mellitus   . Hypertension   . Implantable defibrillator   medtronic   . Ischemic cardiomyopathy    a. s/p ICD. b. LHC (10/04/13): 1. Severe native CAD with all bypass grafts patent. c. CRT upgrade 12/2013.  [name] LBBB (left bundle branch block)   . Lipoma   . PAD (peripheral artery disease) ([place])   . PICC line infection    a. 01/2014 - PICC exchanged.  . Polysubstance abuse    history of  (cocaine, tobacco and ETOH)  . V-tach [facility])     Current Outpatient Prescriptions  Medication Sig Dispense Refill  . ACCU-CHEK FASTCLIX LANCETS MISC Use to test blood glucose 2 times daily. Dx:E11.9 102 each 12  . acetaminophen (TYLENOL) 325 MG tablet Take 650 mg by mouth every 6 (six) hours as needed for mild pain.    [name] allopurinol (ZYLOPRIM) 100 MG tablet Take 2 tablets (200 mg total) by mouth at bedtime. 60 tablet 3  . amiodarone (PACERONE) 200 MG tablet Take 0.5 tablets (100 mg total) by mouth daily. 15 tablet 3  . aspirin EC 81 MG tablet Take 81 mg by mouth every morning.     [name] atorvastatin (LIPITOR) 80 MG tablet Take 1 tablet (80 mg total) by mouth daily. 90 tablet 3  . B-D ULTRAFINE III SHORT PEN 31G X 8 MM MISC USE TO INJECT INSULIN THREE TIMES DAILY 100 each 0  . Blood Glucose Monitoring Suppl (ACCU-CHEK NANO SMARTVIEW) W/DEVICE KIT Use to test blood glucose 2 times daily. Dx:E11.9 1 kit 0  . capsicum (ZOSTRIX) 0.075 % topical cream Apply 1 application topically 2 (two) times daily. 28.3 g 0  .  diclofenac sodium (VOLTAREN) 1 % GEL Apply 2 g topically 4 (four) times daily as needed (back pain). 2 Tube 2  . digoxin (LANOXIN) 0.125 MG tablet TAKE ONE-HALF TABLET BY MOUTH EVERY OTHER DAY 15 tablet 3  . furosemide (LASIX) 80 MG tablet Take 1 tablet (80 mg total) by mouth daily. 30 tablet 3  . glucose blood (ACCU-CHEK SMARTVIEW) test strip Use to test blood glucose 2 times daily. Dx:E11.9 100 each 12  . LANTUS SOLOSTAR 100 UNIT/ML Solostar Pen INJECT 30 UNITS INTO THE SKIN AT BEDTIME 15 pen 3  . losartan (COZAAR) 25  MG tablet Take 0.5 tablets (12.5 mg total) by mouth 2 (two) times daily. 60 tablet 4  . sitaGLIPtin (JANUVIA) 100 MG tablet Take 0.5 tablets (50 mg total) by mouth daily. 30 tablet 3  . spironolactone (ALDACTONE) 25 MG tablet Take 1 tablet (25 mg total) by mouth daily. 30 tablet 3  . VOLTAREN 1 % GEL APPLY 4 GRAMS TOPICALLY 4 TIMES DAILY 100 g 0  . metolazone (ZAROXOLYN) 2.5 MG tablet Take 2.5 mg by mouth as needed (weight gain).    . nitroGLYCERIN (NITROSTAT) 0.4 MG SL tablet Place 1 tablet (0.4 mg total) under the tongue every 5 (five) minutes as needed. For chest pain. (Patient not taking: Reported on 01/10/2016) 25 tablet 11  . potassium chloride SA (K-DUR,KLOR-CON) 20 MEQ tablet Take 20 mEq by mouth as needed (with metolazone).     No current facility-administered medications for this encounter.     Vitals:   01/10/16 1032  BP: 110/68  Pulse: 70  SpO2: 99%  Weight: 200 lb 8 oz (90.9 kg)    Wt Readings from Last 3 Encounters:  01/10/16 200 lb 8 oz (90.9 kg)  12/19/15 200 lb 11.2 oz (91 kg)  12/14/15 200 lb 3.2 oz (90.8 kg)     PHYSICAL EXAM: General:  Well appearing. NAD HEENT: normal Neck: supple. JVP 6-7  Carotids 2+ bilaterally; no bruits. R side of her neck is slightly more full than left. Cor: PMI normal. RRR. 2/6 SEM LSB 2/6 MR, no s3   Lungs: Clear, Normal effort Abdomen: obese, soft, NT, +distended, no HSM. No bruits or masses. +BS Extremities: no  cyanosis, clubbing, rash. no edema. Fasciotomy scar RLE Neuro: alert & orientedx3, cranial nerves grossly intact. Moves all 4 extremities w/o difficulty. Affect pleasant.  ASSESSMENT & PLAN:  1. Chronic systolic CHF: Ischemic cardiomyopathy s/p Medtronic CRT-D, EF 20-25% ( Echo 04/2015).  She follows in [facility] transplant clinic [facility] q 6 months. She is blood type O so matching for transplant may be difficult.  Would consider LVAD prior to transplant. She has been doing very well off milrinone, thought to possible be 2/2 CRT upgrade.  NYHA class II-early III symptoms.  - Volume status stable on exam and Optivol. Continue lasix 80 daily. With an extra 80 mg and metolazone as needed for fluid up.  BMET today.  - CPX 6/17 slightly worse than previous but relatively stable.  - Recent transplant evaluation at [facility] reassuring had f/u with Dr. Radene Knee - Continue losartan 25 bid - Not good candidate for Entresto with previous failed med titrations and AKIs. -Off carvedilol to severe fatigue and hypotension. Tried multiple times.  - Reinforced the need and importance of daily weights, a low sodium diet, and fluid restriction (less than 2 L a day). Instructed to call the HF clinic if weight increases more than 3 lbs overnight or 5 lbs in a week.  2. CAD: No chest pain.  Continue ASA and statin.  3. Mitral regurgitation: s/p mitral valve annuloplasty with CABG in 2007. Moderate to severe MR on echo in 4/15. TEE (06/2013) mitral regurgitation appeared severe, anatomy of repaired valve does not look suitable for MV clipping and would be high risk for MV replacement. Moderate MR on echo 2/17 4. CKD stage III:  - Recheck CMET today.  5.  NSVT:  - Continue amiodarone 100 mg. CMET today. TSH stable last visit.  6. Obesity - Body mass index is 34.42 kg/m. Needs to keep weight down. If BMI >  4 will not be transplant candidate  - Needs to join an exercise program. Will investigate [facility] and ?cost?   7. PAD:  -  Now having more claudication symptoms.  - Right mid SFA stenosis per [place] 08/30/14.  - Should check ABIs 8. Colon polyps -- Had colonoscopy 06/08/14 with 12 polyps -->Plan for colonoscopy every 2 years.    9. ? Thyroid Enlargment - TSH and Free T4 stable last visit.   10. Cramps/muscle pain - Will check electrolytes and CK, aldolase.  - Stop atorva for 1 week and see if helps  Glori Bickers MD 01/10/16

## 2016-01-10 NOTE — Addendum Note (Signed)
Encounter addended by: Kennieth Rad, RN on: 01/10/2016 11:29 AM<BR>    Actions taken: Visit diagnoses modified, Order list changed, Diagnosis association updated, Sign clinical note

## 2016-01-14 ENCOUNTER — Other Ambulatory Visit: Payer: Self-pay | Admitting: Internal Medicine

## 2016-01-15 ENCOUNTER — Ambulatory Visit (INDEPENDENT_AMBULATORY_CARE_PROVIDER_SITE_OTHER): Payer: Medicare Other | Admitting: *Deleted

## 2016-01-15 DIAGNOSIS — I5022 Chronic systolic (congestive) heart failure: Secondary | ICD-10-CM | POA: Diagnosis not present

## 2016-01-15 DIAGNOSIS — E119 Type 2 diabetes mellitus without complications: Secondary | ICD-10-CM | POA: Diagnosis not present

## 2016-01-15 DIAGNOSIS — Z794 Long term (current) use of insulin: Secondary | ICD-10-CM | POA: Diagnosis not present

## 2016-01-15 DIAGNOSIS — I2510 Atherosclerotic heart disease of native coronary artery without angina pectoris: Secondary | ICD-10-CM | POA: Diagnosis not present

## 2016-01-15 DIAGNOSIS — I472 Ventricular tachycardia, unspecified: Secondary | ICD-10-CM

## 2016-01-15 DIAGNOSIS — I255 Ischemic cardiomyopathy: Secondary | ICD-10-CM | POA: Diagnosis not present

## 2016-01-15 DIAGNOSIS — Z01818 Encounter for other preprocedural examination: Secondary | ICD-10-CM | POA: Diagnosis not present

## 2016-01-15 DIAGNOSIS — Z8679 Personal history of other diseases of the circulatory system: Secondary | ICD-10-CM | POA: Diagnosis not present

## 2016-01-15 LAB — ALDOSTERONE: ALDOSTERONE: 68.7 ng/dL — AB (ref 0.0–30.0)

## 2016-01-15 NOTE — Progress Notes (Signed)
Remote ICD transmission.   

## 2016-01-16 ENCOUNTER — Encounter: Payer: Self-pay | Admitting: *Deleted

## 2016-01-16 ENCOUNTER — Encounter: Payer: Self-pay | Admitting: Internal Medicine

## 2016-01-18 ENCOUNTER — Telehealth: Payer: Self-pay

## 2016-01-21 NOTE — Telephone Encounter (Signed)
Left Message - left message per her request to see if she is able to restart the PREP.She had gotten an patient care assignment/job and wasn't able to commit to the PREP back in early October.     By Milana Huntsman, RN

## 2016-01-25 LAB — CUP PACEART REMOTE DEVICE CHECK
Battery Voltage: 2.91 V
Brady Statistic AP VS Percent: 0.03 %
Brady Statistic RA Percent Paced: 1.74 %
Date Time Interrogation Session: 20171114051703
HIGH POWER IMPEDANCE MEASURED VALUE: 56 Ohm
HighPow Impedance: 44 Ohm
Implantable Lead Implant Date: 20111012
Implantable Lead Location: 753857
Implantable Pulse Generator Implant Date: 20151002
Lead Channel Impedance Value: 494 Ohm
Lead Channel Pacing Threshold Amplitude: 0.75 V
Lead Channel Pacing Threshold Pulse Width: 0.4 ms
Lead Channel Sensing Intrinsic Amplitude: 1.25 mV
Lead Channel Sensing Intrinsic Amplitude: 21.875 mV
Lead Channel Setting Pacing Amplitude: 4.5 V
MDC IDC LEAD IMPLANT DT: 20151002
MDC IDC LEAD IMPLANT DT: 20151002
MDC IDC LEAD LOCATION: 753859
MDC IDC LEAD LOCATION: 753860
MDC IDC LEAD MODEL: 4396
MDC IDC MSMT BATTERY REMAINING LONGEVITY: 13 mo
MDC IDC MSMT LEADCHNL LV IMPEDANCE VALUE: 551 Ohm
MDC IDC MSMT LEADCHNL LV IMPEDANCE VALUE: 570 Ohm
MDC IDC MSMT LEADCHNL LV IMPEDANCE VALUE: 988 Ohm
MDC IDC MSMT LEADCHNL LV PACING THRESHOLD AMPLITUDE: 3.75 V
MDC IDC MSMT LEADCHNL LV PACING THRESHOLD PULSEWIDTH: 1 ms
MDC IDC MSMT LEADCHNL RA PACING THRESHOLD AMPLITUDE: 0.625 V
MDC IDC MSMT LEADCHNL RA PACING THRESHOLD PULSEWIDTH: 0.4 ms
MDC IDC MSMT LEADCHNL RA SENSING INTR AMPL: 1.25 mV
MDC IDC MSMT LEADCHNL RV IMPEDANCE VALUE: 646 Ohm
MDC IDC MSMT LEADCHNL RV IMPEDANCE VALUE: 722 Ohm
MDC IDC MSMT LEADCHNL RV SENSING INTR AMPL: 21.875 mV
MDC IDC SET LEADCHNL LV PACING PULSEWIDTH: 1 ms
MDC IDC SET LEADCHNL RA PACING AMPLITUDE: 2 V
MDC IDC SET LEADCHNL RV PACING AMPLITUDE: 2.5 V
MDC IDC SET LEADCHNL RV PACING PULSEWIDTH: 0.4 ms
MDC IDC SET LEADCHNL RV SENSING SENSITIVITY: 0.45 mV
MDC IDC STAT BRADY AP VP PERCENT: 1.71 %
MDC IDC STAT BRADY AS VP PERCENT: 96.36 %
MDC IDC STAT BRADY AS VS PERCENT: 1.9 %
MDC IDC STAT BRADY RV PERCENT PACED: 96.92 %

## 2016-02-12 ENCOUNTER — Other Ambulatory Visit: Payer: Self-pay | Admitting: Internal Medicine

## 2016-02-19 NOTE — Telephone Encounter (Signed)
Encounter open in error 

## 2016-02-26 ENCOUNTER — Other Ambulatory Visit (HOSPITAL_COMMUNITY): Payer: Self-pay | Admitting: *Deleted

## 2016-02-26 DIAGNOSIS — I5022 Chronic systolic (congestive) heart failure: Secondary | ICD-10-CM

## 2016-02-26 MED ORDER — LOSARTAN POTASSIUM 25 MG PO TABS: 12.5000 mg | ORAL_TABLET | Freq: Two times a day (BID) | ORAL | 4 refills | Status: DC

## 2016-03-06 ENCOUNTER — Telehealth (HOSPITAL_COMMUNITY): Payer: Self-pay

## 2016-03-06 MED ORDER — METOLAZONE 2.5 MG PO TABS: 2.5000 mg | ORAL_TABLET | ORAL | 3 refills | Status: DC | PRN

## 2016-03-06 NOTE — Telephone Encounter (Signed)
Patient requesting refill for metolazone. Rx sent to preferred pharmacy electronically.  Renee Pain, RN

## 2016-03-11 ENCOUNTER — Other Ambulatory Visit (HOSPITAL_COMMUNITY): Payer: Self-pay | Admitting: Internal Medicine

## 2016-03-19 ENCOUNTER — Encounter: Payer: Self-pay | Admitting: Internal Medicine

## 2016-04-09 ENCOUNTER — Encounter: Payer: Self-pay | Admitting: Internal Medicine

## 2016-04-10 ENCOUNTER — Ambulatory Visit (HOSPITAL_COMMUNITY)
Admission: RE | Admit: 2016-04-10 | Discharge: 2016-04-10 | Disposition: A | Discharge status: Home / Self Care | Payer: Medicare Other | Source: Ambulatory Visit | Attending: Cardiology | Admitting: Cardiology

## 2016-04-10 VITALS — BP 110/72 | HR 60 | Wt 198.6 lb

## 2016-04-10 DIAGNOSIS — K635 Polyp of colon: Secondary | ICD-10-CM | POA: Insufficient documentation

## 2016-04-10 DIAGNOSIS — R55 Syncope and collapse: Secondary | ICD-10-CM | POA: Insufficient documentation

## 2016-04-10 DIAGNOSIS — I5022 Chronic systolic (congestive) heart failure: Secondary | ICD-10-CM | POA: Diagnosis not present

## 2016-04-10 DIAGNOSIS — Z794 Long term (current) use of insulin: Secondary | ICD-10-CM | POA: Insufficient documentation

## 2016-04-10 DIAGNOSIS — I255 Ischemic cardiomyopathy: Secondary | ICD-10-CM | POA: Insufficient documentation

## 2016-04-10 DIAGNOSIS — I130 Hypertensive heart and chronic kidney disease with heart failure and stage 1 through stage 4 chronic kidney disease, or unspecified chronic kidney disease: Secondary | ICD-10-CM | POA: Insufficient documentation

## 2016-04-10 DIAGNOSIS — F1410 Cocaine abuse, uncomplicated: Secondary | ICD-10-CM | POA: Insufficient documentation

## 2016-04-10 DIAGNOSIS — Z95810 Presence of automatic (implantable) cardiac defibrillator: Secondary | ICD-10-CM | POA: Insufficient documentation

## 2016-04-10 DIAGNOSIS — I739 Peripheral vascular disease, unspecified: Secondary | ICD-10-CM | POA: Diagnosis not present

## 2016-04-10 DIAGNOSIS — I472 Ventricular tachycardia: Secondary | ICD-10-CM

## 2016-04-10 DIAGNOSIS — I252 Old myocardial infarction: Secondary | ICD-10-CM | POA: Diagnosis not present

## 2016-04-10 DIAGNOSIS — I10 Essential (primary) hypertension: Secondary | ICD-10-CM | POA: Diagnosis not present

## 2016-04-10 DIAGNOSIS — I2510 Atherosclerotic heart disease of native coronary artery without angina pectoris: Secondary | ICD-10-CM | POA: Diagnosis not present

## 2016-04-10 DIAGNOSIS — E669 Obesity, unspecified: Secondary | ICD-10-CM | POA: Diagnosis not present

## 2016-04-10 DIAGNOSIS — I959 Hypotension, unspecified: Secondary | ICD-10-CM | POA: Insufficient documentation

## 2016-04-10 DIAGNOSIS — I25810 Atherosclerosis of coronary artery bypass graft(s) without angina pectoris: Secondary | ICD-10-CM | POA: Diagnosis not present

## 2016-04-10 DIAGNOSIS — I70209 Unspecified atherosclerosis of native arteries of extremities, unspecified extremity: Secondary | ICD-10-CM | POA: Insufficient documentation

## 2016-04-10 DIAGNOSIS — Z951 Presence of aortocoronary bypass graft: Secondary | ICD-10-CM | POA: Insufficient documentation

## 2016-04-10 DIAGNOSIS — N183 Chronic kidney disease, stage 3 unspecified: Secondary | ICD-10-CM

## 2016-04-10 DIAGNOSIS — E1122 Type 2 diabetes mellitus with diabetic chronic kidney disease: Secondary | ICD-10-CM | POA: Insufficient documentation

## 2016-04-10 DIAGNOSIS — I447 Left bundle-branch block, unspecified: Secondary | ICD-10-CM | POA: Diagnosis not present

## 2016-04-10 DIAGNOSIS — I4729 Other ventricular tachycardia: Secondary | ICD-10-CM

## 2016-04-10 DIAGNOSIS — M791 Myalgia: Secondary | ICD-10-CM | POA: Diagnosis not present

## 2016-04-10 DIAGNOSIS — E1151 Type 2 diabetes mellitus with diabetic peripheral angiopathy without gangrene: Secondary | ICD-10-CM | POA: Diagnosis not present

## 2016-04-10 DIAGNOSIS — Z9889 Other specified postprocedural states: Secondary | ICD-10-CM | POA: Insufficient documentation

## 2016-04-10 DIAGNOSIS — I340 Nonrheumatic mitral (valve) insufficiency: Secondary | ICD-10-CM

## 2016-04-10 DIAGNOSIS — Z8249 Family history of ischemic heart disease and other diseases of the circulatory system: Secondary | ICD-10-CM | POA: Insufficient documentation

## 2016-04-10 LAB — BASIC METABOLIC PANEL
Anion gap: 11 (ref 5–15)
BUN: 38 mg/dL — AB (ref 6–20)
CALCIUM: 9.8 mg/dL (ref 8.9–10.3)
CHLORIDE: 100 mmol/L — AB (ref 101–111)
CO2: 27 mmol/L (ref 22–32)
CREATININE: 1.73 mg/dL — AB (ref 0.44–1.00)
GFR, EST AFRICAN AMERICAN: 36 mL/min — AB (ref >60)
GFR, EST NON AFRICAN AMERICAN: 31 mL/min — AB (ref >60)
Glucose, Bld: 133 mg/dL — ABNORMAL HIGH (ref 65–99)
Potassium: 3.4 mmol/L — ABNORMAL LOW (ref 3.5–5.1)
SODIUM: 138 mmol/L (ref 135–145)

## 2016-04-10 LAB — BRAIN NATRIURETIC PEPTIDE: B NATRIURETIC PEPTIDE 5: 143.5 pg/mL — AB (ref 0.0–100.0)

## 2016-04-10 LAB — MAGNESIUM: MAGNESIUM: 2.2 mg/dL (ref 1.7–2.4)

## 2016-04-10 MED ORDER — FUROSEMIDE 80 MG PO TABS: 80.0000 mg | ORAL_TABLET | Freq: Every day | ORAL | 3 refills | Status: DC

## 2016-04-10 MED ORDER — ATORVASTATIN CALCIUM 80 MG PO TABS: 80.0000 mg | ORAL_TABLET | Freq: Every day | ORAL | 3 refills | Status: DC

## 2016-04-10 NOTE — Progress Notes (Signed)
Patient ID: [name], female   DOB: 07-Mar-1955, 61 y.o.   MRN: [id number]    Advanced Heart Failure Clinic Note  PCP: Dr. Posey Pronto  Primary HF:  Dr. Haroldine Laws   HPI: 61 y.o. female with history of HTN, DM2, past polysubstance abuse (ETOH, tobacco, cocaine), CAD s/p CABG x5 with MV annuloplasty (2007), ischemic cardiomyopathy s/p Medtronic CRT-D, chronic systolic HF EF 76%, severe MR, CKD stage III and PAD. Blood type O+.  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 At time of cath LAD, LCX and RCA occluded. EF 45%. Had abrupt stent occlusion the next day and had to go back to the lab. 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   Amitted 8/2-10/07/13 for syncope s/p ICD shock. Concern that VT was possibly from ischemia and taken for [facility]. During cath patient had vagal response and BP dropped to 50s and co-ox was in the upper 40s. Placed on Levophed and transferred to floor. Started on Amiodarone.   On 11/25/13 underwent RHC for low output symptoms. Hemodynamic borderline. Swan left in and she was observed. Cardiac output dropped while in hospital so started on milrinone. Discharged home. Underwent CRT-D upgrade on 12/02/13  In 12/15, she was admitted with catheter infection. She was admitted and catheter was replaced.  She has completed antibiotic.    She has been titrated off milrinone. She has been off since January 2016.   She presents today for regular follow up. Has been feeling well overall. Weight has been stable ~ 198-201. Takes metolazone every Saturday. Met with Dr. Crissie Reese 2 weeks ago. She thinks she is listed for transplant status 2.  Fluid has been OK with weekly metolazone. Has been getting > 5000 steps every day.  No SOB with ADLs. No SOB on flat ground, but does have dyspnea with stairs. No CP, dizziness, lightheadedness, or orthopnea.   Optivol: Thoracic impedence above threshold.  Fluid index up  and down with metolazone weekly.  Pt activity 4 hours daily.   CPX (6/17) peak VO2 13.3 (predicted peak 71%), VE/VCO2 slope 41, RER 1.19 OUES 1.15 (pVO2 corrects to 19.2 ml/kg/min for iBW)  05/19/2012  EF 15% 2/15 EF 15%, diffuse hypokinesis, restrictive diastolic function, s/p MV repair with moderate-severe MR.  4/15: EF 15%, severe MR, mod TR 2/17 EF 20-25%  CPX: 06/17/12 Peak VO2 14.8 (predicted peak VO2 68.5%), VE/VCO2 slope 43.4 OUES: 1.19, Peak RER 1.12 Vent threshold 11.3 (pred peak VO2 52.3%)  CPX (3/15): peak VO2 15.6 (predicted peak VO2 74.5%) VE/VO2 40.8, RER 1.2 CPX (6/16): peak VO2: 13.3 (68.6% predicted peak VO2) - corrects to 18.6 ml/kg/min for iBW   -VE/VCO2 slope: 33.7, OUES: 1.20, Peak RER: 1.12, VE/MVV: 48.4%,O2pulse: 11 (100% predicted O2pulse)   LHC (10/2013) 1) severe native CAD with all bypass grafts patent  11/01/13 RHC RA = 5  RV = 47/3/10  PA = 54/16 (30)  PCW = 14 v = 20  Fick cardiac output/index = 4.8/2.6  Thermo CO/CI = 3.7/2.0  PVR = 4.3 WU  FA sat = 95%  PA sat = 59%, 59%   Labs:  08/12/12 - on lipitor 40 mg daily Cholesterol 180 Triglyceride 195 HDL 29 LDL 112 K 4.5, creatinine 1.4 11/16/12 K 4.0 Labs 1.5 Pro BNP 181 02/14/13: K+ 4.6, Cr 1.33, Dig level 1.8, TSH 2.97 3/15: K 4.2, creatinine 1.07, HCT 32.4 06/2013: Dig level 0.3, pro-BNP 899,  Cholesterol 140, TG 106, HDL 36, LDL 82, Cr 1.5, K+ 4.9 11/11/13: K 4.1 Creatinine 1.8  01/31/14 Creatinine 1.48 K .5  02/07/14:  K 4.2 Creatinine 1.75 Magnesium 2.2 after sprionolactone 12.5 mg  was added  12/15: K 3.8, creatinine 1.32  04/04/2014: K 4.8 Creatinine 1.59 06/08/2014: K 3.3 Creatinine 1.47  04/2015: K 4.5 Creatinine 1.65 05/04/15: K 4.3 creatinine 1.45  FH: Mother deceased: CAD, DM2, HTN        Father deceased: stroke  SH: Works odds/end jobs for [employer]; disabled. Lives in [place] with 2 sons([name] and [name]).   ROS: All systems negative except as listed in HPI, PMH and Problem  List.  Past Medical History:  Diagnosis Date  . AICD (automatic cardioverter/defibrillator) present   . Chronic systolic heart failure ([place])    a. ICM b. ECHO (06/2013): EF 15%, severe MR (06/2013) c. RHC (10/2013): RA 5, RV 23/2/5, PA 25/6 (14), PCWP 12, Fick CO/CI: 3.2/1.7, PVR 0.7 WU, PA 45%, 47% and 55% (with levophed 5), vagal response during cath. d. On home milrinone.  . CKD (chronic kidney disease), stage III   . Coronary artery disease    a. s/p CABG x 5 with MV annuloplasty 2007   . Diabetes mellitus   . Hypertension   . Implantable defibrillator   medtronic   . Ischemic cardiomyopathy    a. s/p ICD. b. LHC (10/04/13): 1. Severe native CAD with all bypass grafts patent. c. CRT upgrade 12/2013.  [name] LBBB (left bundle branch block)   . Lipoma   . PAD (peripheral artery disease) ([place])   . PICC line infection    a. 01/2014 - PICC exchanged.  . Polysubstance abuse    history of  (cocaine, tobacco and ETOH)  . V-tach [facility])     Current Outpatient Prescriptions  Medication Sig Dispense Refill  . ACCU-CHEK FASTCLIX LANCETS MISC Use to test blood glucose 2 times daily. Dx:E11.9 102 each 12  . acetaminophen (TYLENOL) 325 MG tablet Take 650 mg by mouth every 6 (six) hours as needed for mild pain.    [name] allopurinol (ZYLOPRIM) 100 MG tablet Take 2 tablets (200 mg total) by mouth at bedtime. 60 tablet 3  . allopurinol (ZYLOPRIM) 100 MG tablet TAKE TWO TABLETS BY MOUTH AT BEDTIME **CHANG  IN  DOSAGE  OR  PILL  SIZE** 30 tablet 3  . amiodarone (PACERONE) 200 MG tablet Take 0.5 tablets (100 mg total) by mouth daily. 15 tablet 3  . amoxicillin (AMOXIL) 500 MG capsule Take 4 capsules (2,000 mg total) by mouth as needed (1 hour before dental work). 4 capsule 1  . aspirin EC 81 MG tablet Take 81 mg by mouth every morning.     [name] atorvastatin (LIPITOR) 80 MG tablet Take 1 tablet (80 mg total) by mouth daily. 90 tablet 3  . B-D ULTRAFINE III SHORT PEN 31G X 8 MM MISC USE TO INJECT INSULIN THREE TIMES  DAILY 100 each 5  . Blood Glucose Monitoring Suppl (ACCU-CHEK NANO SMARTVIEW) W/DEVICE KIT Use to test blood glucose 2 times daily. Dx:E11.9 1 kit 0  . capsicum (ZOSTRIX) 0.075 % topical cream Apply 1 application topically 2 (two) times daily. 28.3 g 0  . diclofenac sodium (VOLTAREN) 1 % GEL Apply 2 g topically 4 (four) times daily as needed (back pain). 2 Tube 2  . digoxin (LANOXIN) 0.125 MG tablet TAKE ONE-HALF TABLET BY MOUTH EVERY OTHER DAY 15 tablet 3  . furosemide (LASIX) 80 MG tablet  Take 1 tablet (80 mg total) by mouth daily. 30 tablet 3  . glucose blood (ACCU-CHEK SMARTVIEW) test strip Use to test blood glucose 2 times daily. Dx:E11.9 100 each 12  . LANTUS SOLOSTAR 100 UNIT/ML Solostar Pen INJECT 30 UNITS INTO THE SKIN AT BEDTIME 15 pen 3  . losartan (COZAAR) 25 MG tablet Take 0.5 tablets (12.5 mg total) by mouth 2 (two) times daily. 60 tablet 4  . metolazone (ZAROXOLYN) 2.5 MG tablet Take 1 tablet (2.5 mg total) by mouth as needed (weight gain). 30 tablet 3  . nitroGLYCERIN (NITROSTAT) 0.4 MG SL tablet Place 1 tablet (0.4 mg total) under the tongue every 5 (five) minutes as needed. For chest pain. (Patient not taking: Reported on 01/10/2016) 25 tablet 11  . potassium chloride SA (K-DUR,KLOR-CON) 20 MEQ tablet Take 20 mEq by mouth as needed (with metolazone).    . sitaGLIPtin (JANUVIA) 100 MG tablet Take 0.5 tablets (50 mg total) by mouth daily. 30 tablet 3  . spironolactone (ALDACTONE) 25 MG tablet Take 1 tablet (25 mg total) by mouth daily. 30 tablet 3  . VOLTAREN 1 % GEL APPLY 4 GRAMS TOPICALLY 4 TIMES DAILY 100 g 0   No current facility-administered medications for this encounter.     There were no vitals filed for this visit.  Wt Readings from Last 3 Encounters:  01/10/16 200 lb 8 oz (90.9 kg)  12/19/15 200 lb 11.2 oz (91 kg)  12/14/15 200 lb 3.2 oz (90.8 kg)     PHYSICAL EXAM: General:  Well appearing. NAD.  HEENT: Normal Neck: supple. JVP 6-7  Carotids 2+ bilaterally; no  bruits. R side of her neck is slightly more full than left. Cor: PMI normal. RRR. 2/6 SEM LSB 2/6 MR, no s3   Lungs: CTAB, normal effort.  Abdomen: obese, soft, NT, ND, no HSM. No bruits or masses. +BS  Extremities: no cyanosis, clubbing, rash. No edema. Fasciotomy scar RLE Neuro: alert & orientedx3, cranial nerves grossly intact. Moves all 4 extremities w/o difficulty. Affect pleasant.  ASSESSMENT & PLAN:  1. Chronic systolic CHF: Ischemic cardiomyopathy s/p Medtronic CRT-D, EF 20-25% ( Echo 04/2015).  She follows in the transplant clinic at [facility] q 6 months. She is blood type O so matching for transplant may be difficult.  Would consider LVAD prior to transplant. She has been doing very well off milrinone, thought to possible be 2/2 CRT upgrade.  NYHA class II-early III symptoms.  - Volume status stable on exam and Optivol. Continue lasix 80 daily. With an extra 80 mg and metolazone as needed for fluid up.   - She has been taking metolazone weekly. Encouraged her to take AS NEEDED extra lasix instead, and try to rely less on metolazone. BMET today.  - CPX 6/17 slightly worse than previous but relatively stable.  - Recent transplant evaluation at [facility] reassuring had f/u with Dr. Radene Knee. As of last month, is to be listed for transplant pending insurance approval.  - Continue losartan 25 bid. BP stable and has failed up-titration several times.  - Intolerant to coreg with severe fatigue and hypotension. Tried multiple times.  - Reinforced fluid restriction to < 2 L daily, sodium restriction to less than 2000 mg daily, and the importance of daily weights.   2. CAD: No chest pain.  Continue ASA 81 mg daily and atorvastatin 80 mg daily.  - No change to current plan. 3. Mitral regurgitation: s/p mitral valve annuloplasty with CABG in 2007. Moderate to severe MR  on echo in 4/15. TEE (06/2013) mitral regurgitation appeared severe, anatomy of repaired valve does not look suitable for MV clipping and would be  high risk for MV replacement. Moderate MR on echo 2/17 - No change to current plan.  4. CKD stage III:  - BMET today.  5.  NSVT:  - Continue amiodarone 100 mg.  - LFTs and TSH stable recently.  6. Obesity - Needs to keep weight down. If BMI > 35 will not be transplant candidate.  - Reinforced need to increase activity as able and limit portions.  7. PAD:  - Having stable claudication symptoms. Should re-check ABIs if worsens.  - Right mid SFA stenosis per [place] 08/30/14.  8. Colon polyps -- Had colonoscopy 06/08/14 with 12 polyps -->Plan for colonoscopy every 2 years.    - No change to current plan.  9. ? Thyroid Enlargment - TSH and Free T4 stable last visit.   - No change to current plan.  10. Cramps/muscle pain - Not relieved with holding atorvastatin. Will resume.   Stable on current meds. Now listed for transplant pending insurance approval at [facility].  Labs today. Follow up 3 months with MD, sooner with symptoms.   Shirley Friar, PA-C  04/10/16

## 2016-04-10 NOTE — Patient Instructions (Signed)
Routine lab work today. Will notify you of abnormal results, otherwise no news is good news!  Refilled atorvastatin and lasix.  Follow up 3 months with Dr. Haroldine Laws.  Do the following things EVERYDAY: 1) Weigh yourself in the morning before breakfast. Write it down and keep it in a log. 2) Take your medicines as prescribed 3) Eat low salt foods-Limit salt (sodium) to 2000 mg per day.  4) Stay as active as you can everyday 5) Limit all fluids for the day to less than 2 liters

## 2016-04-11 ENCOUNTER — Encounter: Payer: Self-pay | Admitting: Internal Medicine

## 2016-04-11 ENCOUNTER — Ambulatory Visit (INDEPENDENT_AMBULATORY_CARE_PROVIDER_SITE_OTHER): Payer: Medicare Other | Admitting: Internal Medicine

## 2016-04-11 VITALS — BP 98/60 | HR 71 | Ht 64.0 in | Wt 201.6 lb

## 2016-04-11 DIAGNOSIS — I5022 Chronic systolic (congestive) heart failure: Secondary | ICD-10-CM

## 2016-04-11 DIAGNOSIS — I447 Left bundle-branch block, unspecified: Secondary | ICD-10-CM

## 2016-04-11 DIAGNOSIS — Z95810 Presence of automatic (implantable) cardiac defibrillator: Secondary | ICD-10-CM

## 2016-04-11 DIAGNOSIS — I25810 Atherosclerosis of coronary artery bypass graft(s) without angina pectoris: Secondary | ICD-10-CM | POA: Diagnosis not present

## 2016-04-11 DIAGNOSIS — Z79899 Other long term (current) drug therapy: Secondary | ICD-10-CM

## 2016-04-11 DIAGNOSIS — I428 Other cardiomyopathies: Secondary | ICD-10-CM

## 2016-04-11 DIAGNOSIS — I4729 Other ventricular tachycardia: Secondary | ICD-10-CM

## 2016-04-11 DIAGNOSIS — I472 Ventricular tachycardia: Secondary | ICD-10-CM

## 2016-04-11 NOTE — Patient Instructions (Signed)
Medication Instructions: - Your physician recommends that you continue on your current medications as directed. Please refer to the Current Medication list given to you today.  Labwork: - Your physician recommends that you have lab work today: Digoxin/ TSH  Procedures/Testing: - none ordered  Follow-Up: - Remote monitoring is used to monitor your Pacemaker of ICD from home. This monitoring reduces the number of office visits required to check your device to one time per year. It allows [place] to keep an eye on the functioning of your device to ensure it is working properly. You are scheduled for a device check from home on 07/10/16. You may send your transmission at any time that day. If you have a wireless device, the transmission will be sent automatically. After your physician reviews your transmission, you will receive a postcard with your next transmission date.  - Your physician wants you to follow-up in: 6 months with Chanetta Marshall, NP for Dr. Caryl Comes.  You will receive a reminder letter in the mail two months in advance. If you don't receive a letter, please call our office to schedule the follow-up appointment.   Any Additional Special Instructions Will Be Listed Below (If Applicable).     If you need a refill on your cardiac medications before your next appointment, please call your pharmacy.

## 2016-04-11 NOTE — Addendum Note (Signed)
Addended by: Alvis Lemmings C on: 04/11/2016 02:43 PM   Modules accepted: Orders

## 2016-04-11 NOTE — Progress Notes (Signed)
Patient Care Team: Zada Finders, MD as PCP - General Deboraha Sprang, MD (Cardiology) Jolaine Artist, MD (Cardiology)   HPI  [name] is a 61 y.o. 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  She has a history of inappropriate shocks 8/15. Started with amiodarone. She underwent CRT upgrade at that time. Catheterization 2013-January demonstrated patent grafts cardiac index 2.2 ejection fraction 10-15%  Electrocardiogram April 2013 demonstrated a QRS duration of 124 ms repeat today is 126.   She's been seen at [facility] for consideration of transplant evaluation. DATE TEST    2/13    Cath   EF 10-15 %   9/15 LHC  Patent graft  2/17    Echo   EF 20-25% %         Date TSH LFTs Cr  9/17  4.8 48     2/18   1.73  \  Records and Results Reviewed  CHF notes  Past Medical History:  Diagnosis Date  . AICD (automatic cardioverter/defibrillator) present   . Chronic systolic heart failure ([place])    a. ICM b. ECHO (06/2013): EF 15%, severe MR (06/2013) c. RHC (10/2013): RA 5, RV 23/2/5, PA 25/6 (14), PCWP 12, Fick CO/CI: 3.2/1.7, PVR 0.7 WU, PA 45%, 47% and 55% (with levophed 5), vagal response during cath. d. On home milrinone.  . CKD (chronic kidney disease), stage III   . Coronary artery disease    a. s/p CABG x 5 with MV annuloplasty 2007   . Diabetes mellitus   . Hypertension   . Implantable defibrillator   medtronic   . Ischemic cardiomyopathy    a. s/p ICD. b. LHC (10/04/13): 1. Severe native CAD with all bypass grafts patent. c. CRT upgrade 12/2013.  [name] LBBB (left bundle branch block)   . Lipoma   . PAD (peripheral artery disease) ([place])   . PICC line infection    a. 01/2014 - PICC exchanged.  . Polysubstance abuse    history of  (cocaine, tobacco and ETOH)  . V-tach [facility])     Past Surgical History:  Procedure Laterality Date  .  BI-VENTRICULAR IMPLANTABLE CARDIOVERTER DEFIBRILLATOR UPGRADE  12/02/2013   MD[facility] [place] CRTD upgrade by Dr Caryl Comes  . BI-VENTRICULAR IMPLANTABLE CARDIOVERTER DEFIBRILLATOR UPGRADE N/A 12/02/2013   Procedure: BI-VENTRICULAR IMPLANTABLE CARDIOVERTER DEFIBRILLATOR UPGRADE;  Surgeon: Deboraha Sprang, MD;  Location: [facility] CATH LAB;  Service: Cardiovascular;  Laterality: N/A;  . CARDIAC CATHETERIZATION    . CARDIAC DEFIBRILLATOR PLACEMENT     2011, Followed By Dr. Caryl Comes  . COLONOSCOPY WITH PROPOFOL N/A 06/08/2014   Procedure: COLONOSCOPY WITH PROPOFOL;  Surgeon: Wilford Corner, MD;  Location: [facility] ENDOSCOPY;  Service: Endoscopy;  Laterality: N/A;  . CORONARY ARTERY BYPASS GRAFT    . LEFT AND RIGHT HEART CATHETERIZATION WITH CORONARY/GRAFT ANGIOGRAM N/A 03/17/2011   Procedure: LEFT AND RIGHT HEART CATHETERIZATION WITH Beatrix Fetters;  Surgeon: Jolaine Artist, MD;  Location: [facility] CATH LAB;  Service: Cardiovascular;  Laterality: N/A;  . LEFT AND RIGHT HEART CATHETERIZATION WITH CORONARY/GRAFT ANGIOGRAM N/A 10/04/2013   Procedure: LEFT AND RIGHT HEART CATHETERIZATION WITH Beatrix Fetters;  Surgeon: Jolaine Artist, MD;  Location: [facility] CATH LAB;  Service: Cardiovascular;  Laterality: N/A;  . RIGHT HEART CATHETERIZATION N/A 05/23/2013   Procedure: RIGHT HEART CATH;  Surgeon: Jolaine Artist, MD;  Location:  [place] CATH LAB;  Service: Cardiovascular;  Laterality: N/A;  . RIGHT HEART CATHETERIZATION N/A 11/01/2013   Procedure: RIGHT HEART CATH;  Surgeon: Jolaine Artist, MD;  Location: [facility] CATH LAB;  Service: Cardiovascular;  Laterality: N/A;  . RIGHT HEART CATHETERIZATION N/A 11/25/2013   Procedure: RIGHT HEART CATH;  Surgeon: Jolaine Artist, MD;  Location[facility] CATH LAB;  Service: Cardiovascular;  Laterality: N/A;  . TEE WITHOUT CARDIOVERSION N/A 06/02/2013   Procedure: TRANSESOPHAGEAL ECHOCARDIOGRAM (TEE);  Surgeon: Larey Dresser, MD;  Location: [facility] ENDOSCOPY;  Service: Cardiovascular;  Laterality: N/A;     Current Outpatient Prescriptions  Medication Sig Dispense Refill  . ACCU-CHEK FASTCLIX LANCETS MISC Use to test blood glucose 2 times daily. Dx:E11.9 102 each 12  . acetaminophen (TYLENOL) 325 MG tablet Take 650 mg by mouth every 6 (six) hours as needed for mild pain.    [name] allopurinol (ZYLOPRIM) 100 MG tablet Take 2 tablets (200 mg total) by mouth at bedtime. 60 tablet 3  . amiodarone (PACERONE) 200 MG tablet Take 0.5 tablets (100 mg total) by mouth daily. 15 tablet 3  . amoxicillin (AMOXIL) 500 MG capsule Take 4 capsules (2,000 mg total) by mouth as needed (1 hour before dental work). 4 capsule 1  . aspirin EC 81 MG tablet Take 81 mg by mouth every morning.     [name] atorvastatin (LIPITOR) 80 MG tablet Take 1 tablet (80 mg total) by mouth daily. 90 tablet 3  . B-D ULTRAFINE III SHORT PEN 31G X 8 MM MISC USE TO INJECT INSULIN THREE TIMES DAILY 100 each 5  . Blood Glucose Monitoring Suppl (ACCU-CHEK NANO SMARTVIEW) W/DEVICE KIT Use to test blood glucose 2 times daily. Dx:E11.9 1 kit 0  . diclofenac sodium (VOLTAREN) 1 % GEL Apply 2 g topically 4 (four) times daily as needed (back pain). 2 Tube 2  . digoxin (LANOXIN) 0.125 MG tablet TAKE ONE-HALF TABLET BY MOUTH EVERY OTHER DAY 15 tablet 3  . furosemide (LASIX) 80 MG tablet Take 1 tablet (80 mg total) by mouth daily. 30 tablet 3  . glucose blood (ACCU-CHEK SMARTVIEW) test strip Use to test blood glucose 2 times daily. Dx:E11.9 100 each 12  . LANTUS SOLOSTAR 100 UNIT/ML Solostar Pen INJECT 30 UNITS INTO THE SKIN AT BEDTIME 15 pen 3  . losartan (COZAAR) 25 MG tablet Take 0.5 tablets (12.5 mg total) by mouth 2 (two) times daily. 60 tablet 4  . metolazone (ZAROXOLYN) 2.5 MG tablet Take 2.5 mg by mouth once a week. On saturdays    . nitroGLYCERIN (NITROSTAT) 0.4 MG SL tablet Place 1 tablet (0.4 mg total) under the tongue every 5 (five) minutes as needed. For chest pain. 25 tablet 11  . potassium chloride SA (K-DUR,KLOR-CON) 20 MEQ tablet Take 20 mEq by  mouth as needed (with metolazone).    . sitaGLIPtin (JANUVIA) 100 MG tablet Take 0.5 tablets (50 mg total) by mouth daily. 30 tablet 3  . spironolactone (ALDACTONE) 25 MG tablet Take 1 tablet (25 mg total) by mouth daily. 30 tablet 3  . VOLTAREN 1 % GEL APPLY 4 GRAMS TOPICALLY 4 TIMES DAILY 100 g 0   No current facility-administered medications for this visit.     No Known Allergies    Review of Systems negative except from HPI and PMH  Physical Exam BP 98/60   Pulse 71   Ht '5\' 4"'$  (1.626 m)   Wt 201 lb 9.6 oz (91.4 kg)   SpO2 95%   BMI  34.60 kg/m  Well developed and well nourished in no acute distress HENT normal E scleral and icterus clear Neck Supple JVP flat; carotids brisk and full Clear to ausculation  Regular rate and rhythm, no murmurs gallops or rub Soft with active bowel sounds No clubbing cyanosis  Edema Alert and oriented, grossly normal motor and sensory function Skin Warm and Dry  ECG demonstrates P synchronous pacing. The QRS is QR in lead 1 and rS in lead V1.  Assessment and  Plan   Nonischemic/valvular cardiomyopathy  Coronary artery disease Without symptoms of ischemia  LBBB  CHF chronic systolic   Ventricular Tachycardia   Keloid   Implantable defibrillator-Medtronic _ CRT The patient's device was interrogated.  The information was reviewed.    LV threshold is very high.  There was no improvement with alternative factors.  Amiodarone  seems to be being tolerated. We'll check surveillance laboratories today. Her TSH was going up at the last visit.  We will check her digoxin level.  Euvolemic continue current meds   Current medicines are reviewed at length with the patient today .  The patient does not have concerns regarding medicines.

## 2016-04-12 LAB — TSH: TSH: 3.32 u[IU]/mL (ref 0.450–4.500)

## 2016-04-12 LAB — DIGOXIN LEVEL: Digoxin, Serum: 0.5 ng/mL (ref 0.5–0.9)

## 2016-04-18 LAB — CUP PACEART INCLINIC DEVICE CHECK
Battery Voltage: 2.9 V
Brady Statistic AP VP Percent: 4.08 %
Brady Statistic RA Percent Paced: 4.12 %
Date Time Interrogation Session: 20180209200024
HighPow Impedance: 41 Ohm
HighPow Impedance: 54 Ohm
Implantable Lead Implant Date: 20111012
Implantable Lead Implant Date: 20151002
Implantable Lead Location: 753857
Implantable Lead Location: 753860
Implantable Lead Model: 4396
Implantable Lead Model: 6947
Lead Channel Impedance Value: 437 Ohm
Lead Channel Impedance Value: 551 Ohm
Lead Channel Impedance Value: 551 Ohm
Lead Channel Impedance Value: 931 Ohm
Lead Channel Pacing Threshold Amplitude: 1 V
Lead Channel Pacing Threshold Pulse Width: 0.4 ms
Lead Channel Pacing Threshold Pulse Width: 1 ms
Lead Channel Sensing Intrinsic Amplitude: 0.875 mV
Lead Channel Setting Pacing Amplitude: 2 V
Lead Channel Setting Pacing Amplitude: 4.5 V
MDC IDC LEAD IMPLANT DT: 20151002
MDC IDC LEAD LOCATION: 753859
MDC IDC MSMT BATTERY REMAINING LONGEVITY: 13 mo
MDC IDC MSMT LEADCHNL LV IMPEDANCE VALUE: 494 Ohm
MDC IDC MSMT LEADCHNL LV PACING THRESHOLD AMPLITUDE: 3.75 V
MDC IDC MSMT LEADCHNL RA PACING THRESHOLD PULSEWIDTH: 0.4 ms
MDC IDC MSMT LEADCHNL RV IMPEDANCE VALUE: 646 Ohm
MDC IDC MSMT LEADCHNL RV PACING THRESHOLD AMPLITUDE: 1 V
MDC IDC MSMT LEADCHNL RV SENSING INTR AMPL: 25.25 mV
MDC IDC PG IMPLANT DT: 20151002
MDC IDC SET LEADCHNL LV PACING PULSEWIDTH: 1 ms
MDC IDC SET LEADCHNL RV PACING AMPLITUDE: 2.5 V
MDC IDC SET LEADCHNL RV PACING PULSEWIDTH: 0.4 ms
MDC IDC SET LEADCHNL RV SENSING SENSITIVITY: 0.45 mV
MDC IDC STAT BRADY AP VS PERCENT: 0.06 %
MDC IDC STAT BRADY AS VP PERCENT: 94.14 %
MDC IDC STAT BRADY AS VS PERCENT: 1.72 %
MDC IDC STAT BRADY RV PERCENT PACED: 97.59 %

## 2016-04-22 DIAGNOSIS — I5022 Chronic systolic (congestive) heart failure: Secondary | ICD-10-CM | POA: Diagnosis not present

## 2016-04-28 ENCOUNTER — Telehealth (HOSPITAL_COMMUNITY): Payer: Self-pay | Admitting: *Deleted

## 2016-04-28 DIAGNOSIS — I5022 Chronic systolic (congestive) heart failure: Secondary | ICD-10-CM

## 2016-04-28 NOTE — Telephone Encounter (Signed)
Pt called stating [facility] wanted her to have labs to recheck her K level due to recent change and pt has been having cramping, order placed, pt will come tomorrow

## 2016-04-29 ENCOUNTER — Ambulatory Visit (HOSPITAL_COMMUNITY)
Admission: RE | Admit: 2016-04-29 | Discharge: 2016-04-29 | Disposition: A | Discharge status: Home / Self Care | Payer: Medicare Other | Source: Ambulatory Visit | Attending: Cardiology | Admitting: Cardiology

## 2016-04-29 DIAGNOSIS — I5022 Chronic systolic (congestive) heart failure: Secondary | ICD-10-CM | POA: Insufficient documentation

## 2016-04-29 LAB — BASIC METABOLIC PANEL
Anion gap: 12 (ref 5–15)
BUN: 46 mg/dL — AB (ref 6–20)
CALCIUM: 10.5 mg/dL — AB (ref 8.9–10.3)
CO2: 27 mmol/L (ref 22–32)
CREATININE: 1.81 mg/dL — AB (ref 0.44–1.00)
Chloride: 98 mmol/L — ABNORMAL LOW (ref 101–111)
GFR calc non Af Amer: 30 mL/min — ABNORMAL LOW (ref >60)
GFR, EST AFRICAN AMERICAN: 34 mL/min — AB (ref >60)
Glucose, Bld: 139 mg/dL — ABNORMAL HIGH (ref 65–99)
Potassium: 4 mmol/L (ref 3.5–5.1)
SODIUM: 137 mmol/L (ref 135–145)

## 2016-04-29 LAB — DIGOXIN LEVEL: Digoxin Level: <0.2 ng/mL — ABNORMAL LOW (ref 0.8–2.0)

## 2016-05-12 ENCOUNTER — Other Ambulatory Visit (HOSPITAL_COMMUNITY): Payer: Self-pay | Admitting: Internal Medicine

## 2016-05-14 ENCOUNTER — Other Ambulatory Visit (HOSPITAL_COMMUNITY)
Admission: RE | Admit: 2016-05-14 | Discharge: 2016-05-14 | Disposition: A | Discharge status: Home / Self Care | Payer: Medicare Other | Source: Ambulatory Visit | Attending: Internal Medicine | Admitting: Internal Medicine

## 2016-05-14 ENCOUNTER — Other Ambulatory Visit (HOSPITAL_COMMUNITY): Admission: RE | Admit: 2016-05-14 | Payer: Medicare Other | Source: Ambulatory Visit | Admitting: Internal Medicine

## 2016-05-14 ENCOUNTER — Ambulatory Visit (INDEPENDENT_AMBULATORY_CARE_PROVIDER_SITE_OTHER): Payer: Medicare Other | Admitting: Internal Medicine

## 2016-05-14 ENCOUNTER — Other Ambulatory Visit: Payer: Self-pay | Admitting: Internal Medicine

## 2016-05-14 ENCOUNTER — Encounter: Payer: Self-pay | Admitting: Internal Medicine

## 2016-05-14 VITALS — BP 93/59 | HR 74 | Temp 98.2°F | Ht 64.0 in | Wt 202.4 lb

## 2016-05-14 DIAGNOSIS — Z79899 Other long term (current) drug therapy: Secondary | ICD-10-CM

## 2016-05-14 DIAGNOSIS — R87810 Cervical high risk human papillomavirus (HPV) DNA test positive: Secondary | ICD-10-CM | POA: Diagnosis not present

## 2016-05-14 DIAGNOSIS — Z0000 Encounter for general adult medical examination without abnormal findings: Secondary | ICD-10-CM | POA: Insufficient documentation

## 2016-05-14 DIAGNOSIS — Z7982 Long term (current) use of aspirin: Secondary | ICD-10-CM | POA: Diagnosis not present

## 2016-05-14 DIAGNOSIS — E119 Type 2 diabetes mellitus without complications: Secondary | ICD-10-CM

## 2016-05-14 DIAGNOSIS — Z794 Long term (current) use of insulin: Secondary | ICD-10-CM | POA: Diagnosis not present

## 2016-05-14 DIAGNOSIS — Z87891 Personal history of nicotine dependence: Secondary | ICD-10-CM | POA: Diagnosis not present

## 2016-05-14 DIAGNOSIS — I5022 Chronic systolic (congestive) heart failure: Secondary | ICD-10-CM

## 2016-05-14 DIAGNOSIS — E118 Type 2 diabetes mellitus with unspecified complications: Secondary | ICD-10-CM

## 2016-05-14 DIAGNOSIS — I110 Hypertensive heart disease with heart failure: Secondary | ICD-10-CM | POA: Diagnosis not present

## 2016-05-14 DIAGNOSIS — Z01419 Encounter for gynecological examination (general) (routine) without abnormal findings: Secondary | ICD-10-CM

## 2016-05-14 DIAGNOSIS — I10 Essential (primary) hypertension: Secondary | ICD-10-CM

## 2016-05-14 LAB — POCT GLYCOSYLATED HEMOGLOBIN (HGB A1C): HEMOGLOBIN A1C: 7.5

## 2016-05-14 LAB — GLUCOSE, CAPILLARY: Glucose-Capillary: 195 mg/dL — ABNORMAL HIGH (ref 65–99)

## 2016-05-14 MED ORDER — INSULIN GLARGINE 100 UNIT/ML SOLOSTAR PEN: PEN_INJECTOR | SUBCUTANEOUS | 3 refills | Status: DC

## 2016-05-14 NOTE — Progress Notes (Signed)
CC: T2DM  HPI:  [name] is a 61 y.o. female with PMH as listed below including CAD s/p CABG x5 with mitral annuloplasty in 2007, ischemic cardiomyopathy w/ CHF s/p CRT-D, T2DM, HTN, CKD 3, and PAD who presents for follow up management of her T2DM and for a pap smear. Please see problem based charting for status of patient's chronic medical issues.  T2DM: Last HgbA1c was 7.5 on 12/19/15. Patient currently takes Lantus 32 units qhs and Januvia 50 mg daily. She checks her CBGs twice a day before breakfast and dinner and sees most numbers between 125-150. She denies any hyperglycemic or hypoglycemic episodes.   Chronic systolic heart failure: Patient follows with Heart Failure clinic Dr. Haroldine Laws, EP Dr. Caryl Comes, as well as Dr. Radene Knee with the Heart Transplant clinic in [facility]. She takes Amiodarone 100 mg daily, Digoxin 0.0625 mg every other day, Losartan 12.5 mg BID, Spironolactone 25 mg daily, Lasix 80 mg daily, Lipitor 80 mg daily, Metolazone 2.5 mg once weekly with supplemental potassium, and ASA 81 mg daily. She says she has been placed on the heart transplant list. She denies any CP, palpitations, dyspnea, orthopnea. She limits sodium and fluid intake.   HTN: Patient currently takes Losartan 12.5 mg BID, Spironolactone 25 mg daily, and Lasix 80 mg daily for her heart failure and HTN. She has not been able to tolerate higher doses of Losartan and Spironolactone due to borderline low blood pressures. BP this visit is 93/59. She denies any recent lightheadedness, dizziness, near-syncope, or syncopal episodes.  Healthcare maintenance: Patient due for pap smear. Last pap smear was in 2013. Her LMP was 8 years ago. She has not had surgery to remove her cervix, uterus, or ovaries.   Past Medical History:  Diagnosis Date  . AICD (automatic cardioverter/defibrillator) present   . Chronic systolic heart failure ([place])    a. ICM b. ECHO (06/2013): EF 15%, severe MR (06/2013) c. RHC  (10/2013): RA 5, RV 23/2/5, PA 25/6 (14), PCWP 12, Fick CO/CI: 3.2/1.7, PVR 0.7 WU, PA 45%, 47% and 55% (with levophed 5), vagal response during cath. d. On home milrinone.  . CKD (chronic kidney disease), stage III   . Coronary artery disease    a. s/p CABG x 5 with MV annuloplasty 2007   . Diabetes mellitus   . Hypertension   . Implantable defibrillator   medtronic   . Ischemic cardiomyopathy    a. s/p ICD. b. LHC (10/04/13): 1. Severe native CAD with all bypass grafts patent. c. CRT upgrade 12/2013.  [name] LBBB (left bundle branch block)   . Lipoma   . PAD (peripheral artery disease) ([place])   . PICC line infection    a. 01/2014 - PICC exchanged.  . Polysubstance abuse    history of  (cocaine, tobacco and ETOH)  . V-tach [facility])     Review of Systems:   Review of Systems  Constitutional: Negative for diaphoresis and fever.  Respiratory: Negative for shortness of breath.   Cardiovascular: Negative for chest pain, palpitations, orthopnea and leg swelling.  Genitourinary: Negative for dysuria, frequency and hematuria.  Musculoskeletal: Negative for falls.  Neurological: Negative for dizziness, focal weakness and loss of consciousness.     Physical Exam:  Vitals:   05/14/16 1344  BP: (!) 93/59  Pulse: 74  Temp: 98.2 F (36.8 C)  TempSrc: Oral  SpO2: 100%  Weight: 202 lb 6.4 oz (91.8 kg)  Height: '5\' 4"'$  (1.626 m)   Physical Exam  Constitutional: She is oriented to person, place, and time.  Cardiovascular: Normal rate and regular rhythm.   Pulmonary/Chest: Effort normal. No respiratory distress. She has no wheezes. She has no rales.  Genitourinary: Vagina normal. There is no rash, tenderness or lesion on the right labia. There is no rash, tenderness or lesion on the left labia. Right adnexum displays no mass and no tenderness. Left adnexum displays no mass and no tenderness. No tenderness or bleeding in the vagina. No vaginal discharge found.  Genitourinary Comments: Cervix unable to  be visualized during pelvic exam.  Musculoskeletal: She exhibits no edema.  Neurological: She is alert and oriented to person, place, and time.    Assessment & Plan:   See Encounters Tab for problem based charting.  Patient seen with Dr. Beryle Beams

## 2016-05-14 NOTE — Patient Instructions (Addendum)
It was a pleasure to see you again [name].  Please continue your medications as prescribed.  We performed a pap smear today. It may take several days to a week to receive the results.  Please follow up with me in 3 months or sooner if needed.

## 2016-05-15 NOTE — Progress Notes (Signed)
Medicine attending: I personally interviewed and briefly examined this patient on the day of the patient visit and reviewed pertinent clinical ,laboratory, and radiographic data  with resident physician Dr. Zada Finders and we discussed a management plan. I supervised Dr Posey Pronto for a pelvic exam on this patient. Female RN present.

## 2016-05-15 NOTE — Assessment & Plan Note (Signed)
Patient due for pap smear. Last pap smear was in 2013. Her LMP was 8 years ago. She has not had surgery to remove her cervix, uterus, or ovaries.  Pelvic exam and pap smear performed in clinic, however cervix was unable to be directly visualized. Otherwise exam was without external abnormality, adnexal mass or tenderness, or vaginal discharge.

## 2016-05-15 NOTE — Assessment & Plan Note (Signed)
Patient currently takes Losartan 12.5 mg BID, Spironolactone 25 mg daily, and Lasix 80 mg daily for her heart failure and HTN. She has not been able to tolerate higher doses of Losartan and Spironolactone due to borderline low blood pressures. BP this visit is 93/59. She denies any recent lightheadedness, dizziness, near-syncope, or syncopal episodes.  BP Readings from Last 3 Encounters:  05/14/16 (!) 93/59  04/11/16 98/60  04/10/16 110/72   Her BP continues to be borderline low. She is asymptomatic. Will continue current medications and continue to monitor.

## 2016-05-15 NOTE — Assessment & Plan Note (Signed)
Patient follows with Heart Failure clinic Dr. Haroldine Laws, EP Dr. Caryl Comes, as well as Dr. Radene Knee with the Heart Transplant clinic in [facility]. She takes Amiodarone 100 mg daily, Digoxin 0.0625 mg every other day, Losartan 12.5 mg BID, Spironolactone 25 mg daily, Lasix 80 mg daily, Lipitor 80 mg daily, Metolazone 2.5 mg once weekly with supplemental potassium, and ASA 81 mg daily. She says she has been placed on the heart transplant list. She denies any CP, palpitations, dyspnea, orthopnea. She limits sodium and fluid intake.  Her CHF is currently stable and she is asymptomatic. She will continue her current medications and continue to follow up with her cardiac specialists. She is approved for heart transplant with the [place] [facility].

## 2016-05-15 NOTE — Assessment & Plan Note (Signed)
Last HgbA1c was 7.5 on 12/19/15. Patient currently takes Lantus 32 units qhs and Januvia 50 mg daily. She checks her CBGs twice a day before breakfast and dinner and sees most numbers between 125-150. She denies any hyperglycemic or hypoglycemic episodes.  Repeat Hgb A1c this visit is stable at 7.5. She will continue her current medications. - Continue Lantus 32 units qhs and Januvia 50 mg daily - Continue lifestyle modifications - Repeat Hgb A1c in 3 months

## 2016-05-16 ENCOUNTER — Other Ambulatory Visit: Payer: Self-pay | Admitting: Internal Medicine

## 2016-05-16 DIAGNOSIS — Z1231 Encounter for screening mammogram for malignant neoplasm of breast: Secondary | ICD-10-CM

## 2016-05-18 ENCOUNTER — Other Ambulatory Visit (HOSPITAL_COMMUNITY): Payer: Self-pay | Admitting: Internal Medicine

## 2016-05-20 LAB — CYTOLOGY - PAP
Diagnosis: NEGATIVE
HPV 16/18/45 genotyping: NEGATIVE
HPV: DETECTED — AB

## 2016-05-26 ENCOUNTER — Other Ambulatory Visit (HOSPITAL_COMMUNITY): Payer: Self-pay | Admitting: Internal Medicine

## 2016-06-04 ENCOUNTER — Telehealth: Payer: Self-pay

## 2016-06-04 NOTE — Telephone Encounter (Signed)
Requesting lab result. Please call back. 

## 2016-06-04 NOTE — Telephone Encounter (Signed)
Discussed pap smear results with patient 06/04/2016.

## 2016-06-18 ENCOUNTER — Ambulatory Visit
Admission: RE | Admit: 2016-06-18 | Discharge: 2016-06-18 | Disposition: A | Discharge status: Home / Self Care | Payer: Medicare Other | Source: Ambulatory Visit | Attending: Family Medicine | Admitting: Family Medicine

## 2016-06-18 DIAGNOSIS — Z1231 Encounter for screening mammogram for malignant neoplasm of breast: Secondary | ICD-10-CM

## 2016-06-24 ENCOUNTER — Other Ambulatory Visit: Payer: Self-pay | Admitting: Internal Medicine

## 2016-06-24 NOTE — Telephone Encounter (Signed)
Refill Request   glucose blood (ACCU-CHEK SMARTVIEW

## 2016-06-25 NOTE — Telephone Encounter (Signed)
Called pt to clarify orders - ?test strips, meter, or lancets?

## 2016-06-26 ENCOUNTER — Other Ambulatory Visit (HOSPITAL_COMMUNITY): Payer: Self-pay | Admitting: Internal Medicine

## 2016-06-30 MED ORDER — GLUCOSE BLOOD VI STRP
ORAL_STRIP | 12 refills | Status: DC
Start: 2016-06-30 — End: 2017-05-05

## 2016-07-04 ENCOUNTER — Ambulatory Visit (HOSPITAL_COMMUNITY)
Admission: RE | Admit: 2016-07-04 | Discharge: 2016-07-04 | Disposition: A | Discharge status: Home / Self Care | Payer: Medicare Other | Source: Ambulatory Visit | Attending: Internal Medicine | Admitting: Internal Medicine

## 2016-07-04 VITALS — BP 108/66 | HR 69 | Wt 201.0 lb

## 2016-07-04 DIAGNOSIS — I70209 Unspecified atherosclerosis of native arteries of extremities, unspecified extremity: Secondary | ICD-10-CM | POA: Diagnosis not present

## 2016-07-04 DIAGNOSIS — E1151 Type 2 diabetes mellitus with diabetic peripheral angiopathy without gangrene: Secondary | ICD-10-CM | POA: Diagnosis not present

## 2016-07-04 DIAGNOSIS — Z7982 Long term (current) use of aspirin: Secondary | ICD-10-CM | POA: Diagnosis not present

## 2016-07-04 DIAGNOSIS — I2510 Atherosclerotic heart disease of native coronary artery without angina pectoris: Secondary | ICD-10-CM | POA: Diagnosis not present

## 2016-07-04 DIAGNOSIS — I959 Hypotension, unspecified: Secondary | ICD-10-CM | POA: Insufficient documentation

## 2016-07-04 DIAGNOSIS — Z79899 Other long term (current) drug therapy: Secondary | ICD-10-CM | POA: Diagnosis not present

## 2016-07-04 DIAGNOSIS — E1122 Type 2 diabetes mellitus with diabetic chronic kidney disease: Secondary | ICD-10-CM | POA: Diagnosis not present

## 2016-07-04 DIAGNOSIS — N183 Chronic kidney disease, stage 3 (moderate): Secondary | ICD-10-CM | POA: Insufficient documentation

## 2016-07-04 DIAGNOSIS — Z8249 Family history of ischemic heart disease and other diseases of the circulatory system: Secondary | ICD-10-CM | POA: Insufficient documentation

## 2016-07-04 DIAGNOSIS — Z794 Long term (current) use of insulin: Secondary | ICD-10-CM | POA: Diagnosis not present

## 2016-07-04 DIAGNOSIS — I472 Ventricular tachycardia: Secondary | ICD-10-CM | POA: Insufficient documentation

## 2016-07-04 DIAGNOSIS — I5022 Chronic systolic (congestive) heart failure: Secondary | ICD-10-CM | POA: Diagnosis not present

## 2016-07-04 DIAGNOSIS — I340 Nonrheumatic mitral (valve) insufficiency: Secondary | ICD-10-CM | POA: Diagnosis not present

## 2016-07-04 DIAGNOSIS — Z951 Presence of aortocoronary bypass graft: Secondary | ICD-10-CM | POA: Insufficient documentation

## 2016-07-04 DIAGNOSIS — I130 Hypertensive heart and chronic kidney disease with heart failure and stage 1 through stage 4 chronic kidney disease, or unspecified chronic kidney disease: Secondary | ICD-10-CM | POA: Insufficient documentation

## 2016-07-04 DIAGNOSIS — I255 Ischemic cardiomyopathy: Secondary | ICD-10-CM | POA: Insufficient documentation

## 2016-07-04 DIAGNOSIS — Z95810 Presence of automatic (implantable) cardiac defibrillator: Secondary | ICD-10-CM | POA: Insufficient documentation

## 2016-07-04 DIAGNOSIS — I252 Old myocardial infarction: Secondary | ICD-10-CM | POA: Diagnosis not present

## 2016-07-04 DIAGNOSIS — E669 Obesity, unspecified: Secondary | ICD-10-CM | POA: Diagnosis not present

## 2016-07-04 LAB — BASIC METABOLIC PANEL
Anion gap: 13 (ref 5–15)
BUN: 51 mg/dL — AB (ref 6–20)
CALCIUM: 9.8 mg/dL (ref 8.9–10.3)
CO2: 25 mmol/L (ref 22–32)
CREATININE: 2.02 mg/dL — AB (ref 0.44–1.00)
Chloride: 96 mmol/L — ABNORMAL LOW (ref 101–111)
GFR calc non Af Amer: 26 mL/min — ABNORMAL LOW (ref >60)
GFR, EST AFRICAN AMERICAN: 30 mL/min — AB (ref >60)
Glucose, Bld: 186 mg/dL — ABNORMAL HIGH (ref 65–99)
Potassium: 3.5 mmol/L (ref 3.5–5.1)
SODIUM: 134 mmol/L — AB (ref 135–145)

## 2016-07-04 NOTE — Patient Instructions (Signed)
Routine lab work today. Will notify you of abnormal results, otherwise no news is good news!  No changes to medication at this time.  Follow up 4 months with Dr. Haroldine Laws.  Do the following things EVERYDAY: 1) Weigh yourself in the morning before breakfast. Write it down and keep it in a log. 2) Take your medicines as prescribed 3) Eat low salt foods-Limit salt (sodium) to 2000 mg per day.  4) Stay as active as you can everyday 5) Limit all fluids for the day to less than 2 liters

## 2016-07-04 NOTE — Progress Notes (Signed)
Patient ID: [name], female   DOB: September 15, 1955, 61 y.o.   MRN: [id number]    Advanced Heart Failure Clinic Note  PCP: Dr. Posey Pronto  Primary HF:  Dr. Haroldine Laws   HPI: 61 y.o. female with history of HTN, DM2, past polysubstance abuse (ETOH, tobacco, cocaine), CAD s/p CABG x5 with MV annuloplasty (2007), ischemic cardiomyopathy s/p Medtronic CRT-D, chronic systolic HF EF 24%, severe MR, CKD stage III and PAD. Blood type O+.  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 At time of cath LAD, LCX and RCA occluded. EF 45%. Had abrupt stent occlusion the next day and had to go back to the lab. 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   Amitted 8/2-10/07/13 for syncope s/p ICD shock. Concern that VT was possibly from ischemia and taken for [facility]. Started on Amiodarone.   On 11/25/13 underwent RHC for low output symptoms. Hemodynamics borderline. Swan left in and she was observed. Cardiac output dropped while in hospital so started on milrinone. Discharged home. Underwent CRT-D upgrade on 12/02/13  In 12/15, she was admitted with catheter infection. She was admitted and catheter was replaced.  She has completed antibiotic.    She has been titrated off milrinone. She has been off since January 2016.   She presents today for regular follow up. Doing well. Weight up and down but overall stable.Takes metolazone every Sunday. Continues to follow with Dr. Mearl Latin at [facility]. Sees her again next month. She is listed as status 2. No edema, orthopnea or PND. No SOB with ADLs.  No CP or dizzines. No problems with meds.   ICD interrogation ok: No VT/AF. Activity 2-3h/day. 100% Biv pacing   CPX (6/17) peak VO2 13.3 (predicted peak 71%), VE/VCO2 slope 41, RER 1.19 OUES 1.15 (pVO2 corrects to 19.2 ml/kg/min for iBW)  05/19/2012  EF 15% 2/15 EF 15%, diffuse hypokinesis, restrictive diastolic function, s/p MV  repair with moderate-severe MR.  4/15: EF 15%, severe MR, mod TR 2/17 EF 20-25%  CPX: 06/17/12 Peak VO2 14.8 (predicted peak VO2 68.5%), VE/VCO2 slope 43.4 OUES: 1.19, Peak RER 1.12 Vent threshold 11.3 (pred peak VO2 52.3%)  CPX (3/15): peak VO2 15.6 (predicted peak VO2 74.5%) VE/VO2 40.8, RER 1.2 CPX (6/16): peak VO2: 13.3 (68.6% predicted peak VO2) - corrects to 18.6 ml/kg/min for iBW   -VE/VCO2 slope: 33.7, OUES: 1.20, Peak RER: 1.12, VE/MVV: 48.4%,O2pulse: 11 (100% predicted O2pulse)   LHC (10/2013) 1) severe native CAD with all bypass grafts patent  11/01/13 RHC RA = 5  RV = 47/3/10  PA = 54/16 (30)  PCW = 14 v = 20  Fick cardiac output/index = 4.8/2.6  Thermo CO/CI = 3.7/2.0  PVR = 4.3 WU  FA sat = 95%  PA sat = 59%, 59%   Labs:  08/12/12 - on lipitor 40 mg daily Cholesterol 180 Triglyceride 195 HDL 29 LDL 112 K 4.5, creatinine 1.4 11/16/12 K 4.0 Labs 1.5 Pro BNP 181 02/14/13: K+ 4.6, Cr 1.33, Dig level 1.8, TSH 2.97 3/15: K 4.2, creatinine 1.07, HCT 32.4 06/2013: Dig level 0.3, pro-BNP 899, Cholesterol 140, TG 106, HDL 36, LDL 82, Cr 1.5, K+ 4.9 11/11/13: K 4.1 Creatinine 1.8  01/31/14 Creatinine 1.48 K .5  02/07/14:  K 4.2 Creatinine 1.75 Magnesium 2.2 after sprionolactone 12.5 mg  was added  12/15: K 3.8, creatinine 1.32  04/04/2014: K 4.8 Creatinine 1.59 06/08/2014:  K 3.3 Creatinine 1.47  04/2015: K 4.5 Creatinine 1.65 05/04/15: K 4.3 creatinine 1.45  FH: Mother deceased: CAD, DM2, HTN        Father deceased: stroke  SH: Works odds/end jobs for [employer]; disabled. Lives in [place] with 2 sons([name] and [name]).   ROS: All systems negative except as listed in HPI, PMH and Problem List.  Past Medical History:  Diagnosis Date  . AICD (automatic cardioverter/defibrillator) present   . Chronic systolic heart failure ([place])    a. ICM b. ECHO (06/2013): EF 15%, severe MR (06/2013) c. RHC (10/2013): RA 5, RV 23/2/5, PA 25/6 (14), PCWP 12, Fick CO/CI: 3.2/1.7, PVR 0.7 WU, PA  45%, 47% and 55% (with levophed 5), vagal response during cath. d. On home milrinone.  . CKD (chronic kidney disease), stage III   . Coronary artery disease    a. s/p CABG x 5 with MV annuloplasty 2007   . Diabetes mellitus   . Hypertension   . Implantable defibrillator   medtronic   . Ischemic cardiomyopathy    a. s/p ICD. b. LHC (10/04/13): 1. Severe native CAD with all bypass grafts patent. c. CRT upgrade 12/2013.  [name] LBBB (left bundle branch block)   . Lipoma   . PAD (peripheral artery disease) ([place])   . PICC line infection    a. 01/2014 - PICC exchanged.  . Polysubstance abuse    history of  (cocaine, tobacco and ETOH)  . V-tach [facility])     Current Outpatient Prescriptions  Medication Sig Dispense Refill  . ACCU-CHEK FASTCLIX LANCETS MISC Use to test blood glucose 2 times daily. Dx:E11.9 102 each 12  . acetaminophen (TYLENOL) 325 MG tablet Take 650 mg by mouth every 6 (six) hours as needed for mild pain.    [name] allopurinol (ZYLOPRIM) 100 MG tablet Take 2 tablets (200 mg total) by mouth at bedtime. 60 tablet 3  . amiodarone (PACERONE) 200 MG tablet Take 0.5 tablets (100 mg total) by mouth daily. 15 tablet 3  . amoxicillin (AMOXIL) 500 MG capsule Take 4 capsules (2,000 mg total) by mouth as needed (1 hour before dental work). 4 capsule 1  . aspirin EC 81 MG tablet Take 81 mg by mouth every morning.     [name] atorvastatin (LIPITOR) 80 MG tablet Take 1 tablet (80 mg total) by mouth daily. 90 tablet 3  . B-D ULTRAFINE III SHORT PEN 31G X 8 MM MISC USE TO INJECT INSULIN THREE TIMES DAILY 100 each 5  . Blood Glucose Monitoring Suppl (ACCU-CHEK NANO SMARTVIEW) W/DEVICE KIT Use to test blood glucose 2 times daily. Dx:E11.9 1 kit 0  . diclofenac sodium (VOLTAREN) 1 % GEL Apply 2 g topically 4 (four) times daily as needed (back pain). 2 Tube 2  . digoxin (LANOXIN) 0.125 MG tablet TAKE ONE-HALF TABLET BY MOUTH EVERY OTHER DAY 15 tablet 3  . furosemide (LASIX) 80 MG tablet Take 1 tablet (80 mg total) by  mouth daily. 30 tablet 3  . glucose blood (ACCU-CHEK SMARTVIEW) test strip Use to test blood glucose 2 times daily. Dx:E11.9 100 each 12  . Insulin Glargine (LANTUS SOLOSTAR) 100 UNIT/ML Solostar Pen INJECT 33 UNITS INTO THE SKIN AT BEDTIME 15 pen 3  . losartan (COZAAR) 25 MG tablet Take 0.5 tablets (12.5 mg total) by mouth 2 (two) times daily. 60 tablet 4  . metolazone (ZAROXOLYN) 2.5 MG tablet Take 2.5 mg by mouth once a week. On saturdays    . nitroGLYCERIN (NITROSTAT) 0.4  MG SL tablet Place 1 tablet (0.4 mg total) under the tongue every 5 (five) minutes as needed. For chest pain. 25 tablet 11  . potassium chloride SA (K-DUR,KLOR-CON) 20 MEQ tablet Take 20 mEq by mouth as needed (with metolazone).    . sitaGLIPtin (JANUVIA) 100 MG tablet Take 0.5 tablets (50 mg total) by mouth daily. 30 tablet 3  . spironolactone (ALDACTONE) 25 MG tablet Take 1 tablet (25 mg total) by mouth daily. 30 tablet 3   No current facility-administered medications for this encounter.     Vitals:   07/04/16 0947  BP: 108/66  Pulse: 69  SpO2: 97%  Weight: 201 lb (91.2 kg)    Wt Readings from Last 3 Encounters:  07/04/16 201 lb (91.2 kg)  05/14/16 202 lb 6.4 oz (91.8 kg)  04/11/16 201 lb 9.6 oz (91.4 kg)     PHYSICAL EXAM: General:  Well appearing. No resp difficulty HEENT: normal Neck: supple. no JVD. Carotids 2+ bilat; no bruits. No lymphadenopathy or thryomegaly appreciated. Cor: PMI  Laterally displaced. Regular rate & rhythm. 2/6 MR. No s3Lungs: clear Abdomen: soft, nontender, nondistended. No hepatosplenomegaly. No bruits or masses. Good bowel sounds. Extremities: no cyanosis, clubbing, rash, edema Neuro: alert & orientedx3, cranial nerves grossly intact. moves all 4 extremities w/o difficulty. Affect pleasant   ASSESSMENT & PLAN:  1. Chronic systolic CHF: Ischemic cardiomyopathy s/p Medtronic CRT-D, EF 20-25% ( Echo 04/2015).  She follows in the transplant clinic at [facility] q 6 months. She is blood  type O so matching for transplant may be difficult.  Would consider LVAD prior to transplant. She has been doing very well off milrinone, thought to possible be 2/2 CRT upgrade.   - Stable NYHA class II-early III symptoms.  - Volume status stable on exam and Optivol. Continue lasix 80 daily. With an extra 80 mg and metolazone as needed for fluid up.   - BMET today  - CPX 6/17 slightly worse than previous but relatively stable. We discussed repeating it in the near future but she would like to defer as she feels good and recent 6MW at [facility] was good.  - Continue transplant f/u with Dr. Radene Knee at [facility]. Next visit in 6/18 - Continue losartan 12.5 bid. BP stable and has failed up-titration several times.  - Intolerant to coreg with severe fatigue and hypotension. Tried multiple times.  - Reinforced fluid restriction to < 2 L daily, sodium restriction to less than 2000 mg daily, and the importance of daily weights.   2. CAD:  - No s/s of sichemia -  Continue ASA 81 mg daily and atorvastatin 80 mg daily.  - No change to current plan. 3. Mitral regurgitation:  -s/p mitral valve annuloplasty with CABG in 2007. Moderate to severe MR on echo in 4/15. TEE (06/2013) mitral regurgitation appeared severe, anatomy of repaired valve does not look suitable for MV clipping and would be high risk for MV replacement. Moderate MR on echo 2/17 - No change to current plan. Check echo next visit 4. CKD stage III:  - BMET today.  5.  NSVT:  - Continue amiodarone 100 mg.  - LFTs and TSH stable recently.  6. Obesity - Needs to keep weight down. If BMI > 35 will not be transplant candidate.  - Reinforced need to increase activity as able and limit portions. B<I 34.8 today 7. PAD:  - Symptoms stable - Right mid SFA stenosis per [place] 08/30/14.   8. Colon polyps -- Had colonoscopy 06/08/14  with 12 polyps -->Plan for colonoscopy every 2 years.  She is due for f/u  - No change to current plan.    Glori Bickers, MD    07/04/16

## 2016-07-08 ENCOUNTER — Encounter (HOSPITAL_COMMUNITY): Payer: Self-pay | Admitting: Internal Medicine

## 2016-07-09 ENCOUNTER — Telehealth (HOSPITAL_COMMUNITY): Payer: Self-pay | Admitting: *Deleted

## 2016-07-09 DIAGNOSIS — I5022 Chronic systolic (congestive) heart failure: Secondary | ICD-10-CM

## 2016-07-09 NOTE — Telephone Encounter (Signed)
Notes recorded by Scarlette Calico, RN on 07/09/2016 at 5:05 PM EDT Pt aware, appt sch for 5/17 at 9 am

## 2016-07-09 NOTE — Telephone Encounter (Signed)
-----   Message from Jolaine Artist, MD sent at 07/05/2016  2:17 PM EDT ----- Please recheck in 2 weeks

## 2016-07-14 ENCOUNTER — Ambulatory Visit (INDEPENDENT_AMBULATORY_CARE_PROVIDER_SITE_OTHER): Payer: Medicare Other | Admitting: *Deleted

## 2016-07-14 DIAGNOSIS — I428 Other cardiomyopathies: Secondary | ICD-10-CM | POA: Diagnosis not present

## 2016-07-14 NOTE — Progress Notes (Signed)
Remote ICD transmission.   

## 2016-07-15 ENCOUNTER — Encounter: Payer: Self-pay | Admitting: Cardiology

## 2016-07-16 LAB — CUP PACEART REMOTE DEVICE CHECK
Battery Remaining Longevity: 13 mo
Battery Voltage: 2.89 V
Brady Statistic AS VS Percent: 1.69 %
Brady Statistic RA Percent Paced: 3.46 %
Brady Statistic RV Percent Paced: 97.73 %
HIGH POWER IMPEDANCE MEASURED VALUE: 42 Ohm
HighPow Impedance: 48 Ohm
Implantable Lead Implant Date: 20151002
Implantable Lead Implant Date: 20151002
Implantable Lead Location: 753859
Implantable Lead Model: 4396
Implantable Lead Model: 5076
Implantable Lead Model: 6947
Implantable Pulse Generator Implant Date: 20151002
Lead Channel Impedance Value: 513 Ohm
Lead Channel Impedance Value: 608 Ohm
Lead Channel Impedance Value: 874 Ohm
Lead Channel Pacing Threshold Amplitude: 0.5 V
Lead Channel Pacing Threshold Amplitude: 1 V
Lead Channel Pacing Threshold Amplitude: 4.25 V
Lead Channel Pacing Threshold Pulse Width: 0.4 ms
Lead Channel Pacing Threshold Pulse Width: 0.4 ms
Lead Channel Pacing Threshold Pulse Width: 1 ms
Lead Channel Sensing Intrinsic Amplitude: 1 mV
Lead Channel Sensing Intrinsic Amplitude: 14.625 mV
Lead Channel Setting Pacing Amplitude: 2.5 V
Lead Channel Setting Pacing Amplitude: 4.5 V
Lead Channel Setting Pacing Pulse Width: 0.4 ms
Lead Channel Setting Pacing Pulse Width: 1 ms
Lead Channel Setting Sensing Sensitivity: 0.45 mV
MDC IDC LEAD IMPLANT DT: 20111012
MDC IDC LEAD LOCATION: 753857
MDC IDC LEAD LOCATION: 753860
MDC IDC MSMT LEADCHNL LV IMPEDANCE VALUE: 494 Ohm
MDC IDC MSMT LEADCHNL LV IMPEDANCE VALUE: 513 Ohm
MDC IDC MSMT LEADCHNL RA IMPEDANCE VALUE: 399 Ohm
MDC IDC MSMT LEADCHNL RA SENSING INTR AMPL: 1 mV
MDC IDC MSMT LEADCHNL RV SENSING INTR AMPL: 14.625 mV
MDC IDC SESS DTM: 20180514084224
MDC IDC SET LEADCHNL RA PACING AMPLITUDE: 2 V
MDC IDC STAT BRADY AP VP PERCENT: 3.4 %
MDC IDC STAT BRADY AP VS PERCENT: 0.07 %
MDC IDC STAT BRADY AS VP PERCENT: 94.83 %

## 2016-07-17 ENCOUNTER — Ambulatory Visit (HOSPITAL_COMMUNITY)
Admission: RE | Admit: 2016-07-17 | Discharge: 2016-07-17 | Disposition: A | Discharge status: Home / Self Care | Payer: Medicare Other | Source: Ambulatory Visit | Attending: Internal Medicine | Admitting: Internal Medicine

## 2016-07-17 DIAGNOSIS — I5022 Chronic systolic (congestive) heart failure: Secondary | ICD-10-CM

## 2016-07-17 LAB — BASIC METABOLIC PANEL
Anion gap: 12 (ref 5–15)
BUN: 50 mg/dL — ABNORMAL HIGH (ref 6–20)
CALCIUM: 9.6 mg/dL (ref 8.9–10.3)
CO2: 27 mmol/L (ref 22–32)
CREATININE: 1.91 mg/dL — AB (ref 0.44–1.00)
Chloride: 97 mmol/L — ABNORMAL LOW (ref 101–111)
GFR calc non Af Amer: 28 mL/min — ABNORMAL LOW (ref >60)
GFR, EST AFRICAN AMERICAN: 32 mL/min — AB (ref >60)
Glucose, Bld: 186 mg/dL — ABNORMAL HIGH (ref 65–99)
Potassium: 3.4 mmol/L — ABNORMAL LOW (ref 3.5–5.1)
SODIUM: 136 mmol/L (ref 135–145)

## 2016-07-25 ENCOUNTER — Other Ambulatory Visit (HOSPITAL_COMMUNITY): Payer: Self-pay | Admitting: Internal Medicine

## 2016-07-29 ENCOUNTER — Encounter: Payer: Self-pay | Admitting: Cardiology

## 2016-08-06 ENCOUNTER — Other Ambulatory Visit: Payer: Self-pay | Admitting: Internal Medicine

## 2016-08-12 DIAGNOSIS — E119 Type 2 diabetes mellitus without complications: Secondary | ICD-10-CM | POA: Diagnosis not present

## 2016-08-12 DIAGNOSIS — I5022 Chronic systolic (congestive) heart failure: Secondary | ICD-10-CM | POA: Diagnosis not present

## 2016-08-12 DIAGNOSIS — Z01818 Encounter for other preprocedural examination: Secondary | ICD-10-CM | POA: Diagnosis not present

## 2016-08-12 DIAGNOSIS — Z794 Long term (current) use of insulin: Secondary | ICD-10-CM | POA: Diagnosis not present

## 2016-08-12 DIAGNOSIS — N189 Chronic kidney disease, unspecified: Secondary | ICD-10-CM | POA: Diagnosis not present

## 2016-08-12 DIAGNOSIS — Z8679 Personal history of other diseases of the circulatory system: Secondary | ICD-10-CM | POA: Diagnosis not present

## 2016-08-12 DIAGNOSIS — I2510 Atherosclerotic heart disease of native coronary artery without angina pectoris: Secondary | ICD-10-CM | POA: Diagnosis not present

## 2016-08-12 DIAGNOSIS — I255 Ischemic cardiomyopathy: Secondary | ICD-10-CM | POA: Diagnosis not present

## 2016-08-20 ENCOUNTER — Encounter: Payer: Self-pay | Admitting: Internal Medicine

## 2016-08-20 ENCOUNTER — Ambulatory Visit (INDEPENDENT_AMBULATORY_CARE_PROVIDER_SITE_OTHER): Payer: Medicare Other | Admitting: Internal Medicine

## 2016-08-20 VITALS — BP 108/57 | HR 68 | Temp 98.3°F | Wt 202.8 lb

## 2016-08-20 DIAGNOSIS — Z954 Presence of other heart-valve replacement: Secondary | ICD-10-CM | POA: Diagnosis not present

## 2016-08-20 DIAGNOSIS — E1122 Type 2 diabetes mellitus with diabetic chronic kidney disease: Secondary | ICD-10-CM | POA: Diagnosis not present

## 2016-08-20 DIAGNOSIS — I10 Essential (primary) hypertension: Secondary | ICD-10-CM

## 2016-08-20 DIAGNOSIS — Z951 Presence of aortocoronary bypass graft: Secondary | ICD-10-CM | POA: Diagnosis not present

## 2016-08-20 DIAGNOSIS — N183 Chronic kidney disease, stage 3 (moderate): Secondary | ICD-10-CM

## 2016-08-20 DIAGNOSIS — I2510 Atherosclerotic heart disease of native coronary artery without angina pectoris: Secondary | ICD-10-CM | POA: Diagnosis not present

## 2016-08-20 DIAGNOSIS — I110 Hypertensive heart disease with heart failure: Secondary | ICD-10-CM | POA: Diagnosis present

## 2016-08-20 DIAGNOSIS — I130 Hypertensive heart and chronic kidney disease with heart failure and stage 1 through stage 4 chronic kidney disease, or unspecified chronic kidney disease: Secondary | ICD-10-CM | POA: Diagnosis not present

## 2016-08-20 DIAGNOSIS — E118 Type 2 diabetes mellitus with unspecified complications: Secondary | ICD-10-CM

## 2016-08-20 DIAGNOSIS — Z87891 Personal history of nicotine dependence: Secondary | ICD-10-CM

## 2016-08-20 DIAGNOSIS — Z794 Long term (current) use of insulin: Secondary | ICD-10-CM

## 2016-08-20 DIAGNOSIS — E1151 Type 2 diabetes mellitus with diabetic peripheral angiopathy without gangrene: Secondary | ICD-10-CM | POA: Diagnosis not present

## 2016-08-20 DIAGNOSIS — I5022 Chronic systolic (congestive) heart failure: Secondary | ICD-10-CM | POA: Diagnosis not present

## 2016-08-20 DIAGNOSIS — Z79899 Other long term (current) drug therapy: Secondary | ICD-10-CM | POA: Diagnosis not present

## 2016-08-20 LAB — POCT GLYCOSYLATED HEMOGLOBIN (HGB A1C): Hemoglobin A1C: 7.3

## 2016-08-20 LAB — GLUCOSE, CAPILLARY: Glucose-Capillary: 93 mg/dL (ref 65–99)

## 2016-08-20 NOTE — Assessment & Plan Note (Addendum)
Appears to be stable. No signs of hypervolemia on exam or by history. Weights are stable at home. She has essentially increased her Lasix from 80 mg once daily to twice daily now that she has stopped her Metolazone. I advised her to take her Lasix 80 mg once daily and only take a second time per day as needed. She is also not taking potassium supplementation now that she stopped her Metolazone. Will check a BMET today to assess renal function and serum potassium. - Continue Amiodarone 100 mg daily, Digoxin 0.0625 mg every other day, Losartan 12.5 mg BID, Spironolactone 25 mg daily, Lasix 80 mg daily (and prn), and Lipitor 80 mg daily - BMET - Follow up with Heart Failure Clinic and [place] [facility] clinic as scheduled

## 2016-08-20 NOTE — Assessment & Plan Note (Signed)
BP Readings from Last 3 Encounters:  08/20/16 (!) 108/57  07/04/16 108/66  05/14/16 (!) 93/59   Her BP is borderline low, however she remains asymptomatic. She will continue her current medications and we will continue to monitor.

## 2016-08-20 NOTE — Patient Instructions (Signed)
It was a pleasure to see you again [name].  Your Hgb A1c today is 7.3 compared to 7.5 on last visit.  Please continue your medications as prescribed and follow up with your cardiologists as scheduled.  Please continue to work on good eating habits and exercise as tolerated.  Follow up with me in 3 months or sooner if needed.

## 2016-08-20 NOTE — Progress Notes (Signed)
CC: T2DM  HPI:  [name] is a 61 y.o. female with PMH as listed below including CAD s/p CABG x5 with mitral annuloplasty in 2007, ischemic cardiomyopathy w/ CHF s/p CRT-D, T2DM, HTN, CKD 3, and PAD who presents for follow up management of her T2DM. Please see problem based charting for status of patient's chronic medical issues.  T2DM: Last HgbA1c was 7.5 on 05/14/16. Patient currently takes Lantus 33 units qhs and Januvia 50 mg daily. She checks her CBGs once every morning and reports a range from 123 - 156. She denies any hyperglycemic or hypoglycemic episodes.   Chronic systolic heart failure: Patient follows with Heart Failure clinic Dr. Haroldine Laws, EP Dr. Caryl Comes, as well as Dr. Radene Knee with the Heart Transplant clinic in [facility]. She takes Amiodarone 100 mg daily, Digoxin 0.0625 mg every other day, Losartan 12.5 mg BID, Spironolactone 25 mg daily, Lasix 80 mg daily (and prn), and Lipitor 80 mg daily. She was advised to discontinue Metolazone 2.5 mg once weekly due to kidney function.  She denies any CP, palpitations, dyspnea, orthopnea. She limits sodium and fluid intake. She checks her weight at home and has been consistently at 197 - 198 lbs which she says is near her dry weight. She has been taking her Lasix twice daily now that she stopped the metolazone. Her weight this visit is 202 lbs consistent with the last two readings we have going back to March (201-201 lbs). Suspect there is some variation between our scales and hers.  HTN: Patient currently takes Losartan 12.5 mg BID, Spironolactone 25 mg daily, and Lasix 80 mg daily (up to twice daily prn) for her heart failure and HTN. She has not been able to tolerate higher doses of Losartan and Spironolactone due to borderline low blood pressures. Has not tolerated Carvedilol due to hypotension and fatigue. BP this visit is 108/57. She denies any recent lightheadedness, dizziness, near-syncope, or syncopal episodes.   Past  Medical History:  Diagnosis Date  . AICD (automatic cardioverter/defibrillator) present   . Chronic systolic heart failure ([place])    a. ICM b. ECHO (06/2013): EF 15%, severe MR (06/2013) c. RHC (10/2013): RA 5, RV 23/2/5, PA 25/6 (14), PCWP 12, Fick CO/CI: 3.2/1.7, PVR 0.7 WU, PA 45%, 47% and 55% (with levophed 5), vagal response during cath. d. On home milrinone.  . CKD (chronic kidney disease), stage III   . Coronary artery disease    a. s/p CABG x 5 with MV annuloplasty 2007   . Diabetes mellitus   . Hypertension   . Implantable defibrillator   medtronic   . Ischemic cardiomyopathy    a. s/p ICD. b. LHC (10/04/13): 1. Severe native CAD with all bypass grafts patent. c. CRT upgrade 12/2013.  [name] LBBB (left bundle branch block)   . Lipoma   . PAD (peripheral artery disease) ([place])   . PICC line infection    a. 01/2014 - PICC exchanged.  . Polysubstance abuse    history of  (cocaine, tobacco and ETOH)  . V-tach [facility])     Review of Systems:   Review of Systems  Constitutional: Negative for diaphoresis.  Respiratory: Negative for shortness of breath and wheezing.   Cardiovascular: Negative for chest pain, palpitations, orthopnea and leg swelling.  Genitourinary: Negative for dysuria.  Musculoskeletal: Negative for falls and myalgias.  Neurological: Negative for dizziness.  Psychiatric/Behavioral: The patient does not have insomnia.      Physical Exam:  Vitals:   08/20/16 1324  BP: (!) 108/57  Pulse: 68  Temp: 98.3 F (36.8 C)  TempSrc: Oral  SpO2: 100%  Weight: 202 lb 12.8 oz (92 kg)   Physical Exam  Constitutional: She is oriented to person, place, and time. She appears well-developed and well-nourished. No distress.  HENT:  Head: Normocephalic and atraumatic.  Cardiovascular: Normal rate and regular rhythm.   No murmur heard. No JVD  Pulmonary/Chest: Effort normal. No respiratory distress. She has no wheezes.  Abdominal: Soft. There is no tenderness.  Musculoskeletal:  Normal range of motion. She exhibits no edema or tenderness.  Neurological: She is alert and oriented to person, place, and time.  Skin: She is not diaphoretic.  Well healed surgical scar over sternum    Assessment & Plan:   See Encounters Tab for problem based charting.  Patient discussed with Dr. Daryll Drown  Diabetes mellitus type 2, controlled ([place]) Repeat Hgb A1c this visit is slightly improved to 7.3. We will continue her current medications as listed below and advise continued healthy eating patterns and exercise as tolerated. I have asked her to check her CBGs at least twice a day. - Continue Lantus 33 units qhs and Januvia 50 mg daily - Repeat Hgb A1c in 3 months  Chronic systolic heart failure ([facility]) Appears to be stable. No signs of hypervolemia on exam or by history. Weights are stable at home. She has essentially increased her Lasix from 80 mg once daily to twice daily now that she has stopped her Metolazone. I advised her to take her Lasix 80 mg once daily and only take a second time per day as needed. She is also not taking potassium supplementation now that she stopped her Metolazone. Will check a BMET today to assess renal function and serum potassium. - Continue Amiodarone 100 mg daily, Digoxin 0.0625 mg every other day, Losartan 12.5 mg BID, Spironolactone 25 mg daily, Lasix 80 mg daily (and prn), and Lipitor 80 mg daily - BMET - Follow up with Heart Failure Clinic and [place] [facility]plant clinic as scheduled  Hypertension BP Readings from Last 3 Encounters:  08/20/16 (!) 108/57  07/04/16 108/66  05/14/16 (!) 93/59   Her BP is borderline low, however she remains asymptomatic. She will continue her current medications and we will continue to monitor.

## 2016-08-20 NOTE — Assessment & Plan Note (Signed)
Repeat Hgb A1c this visit is slightly improved to 7.3. We will continue her current medications as listed below and advise continued healthy eating patterns and exercise as tolerated. I have asked her to check her CBGs at least twice a day. - Continue Lantus 33 units qhs and Januvia 50 mg daily - Repeat Hgb A1c in 3 months

## 2016-08-21 LAB — BMP8+ANION GAP
Anion Gap: 16 mmol/L (ref 10.0–18.0)
BUN/Creatinine Ratio: 22 (ref 9–23)
BUN: 28 mg/dL — AB (ref 6–24)
CALCIUM: 9.4 mg/dL (ref 8.7–10.2)
CO2: 27 mmol/L (ref 20–29)
CREATININE: 1.27 mg/dL — AB (ref 0.57–1.00)
Chloride: 98 mmol/L (ref 96–106)
GFR calc Af Amer: 53 mL/min/{1.73_m2} — ABNORMAL LOW (ref >59)
GFR, EST NON AFRICAN AMERICAN: 46 mL/min/{1.73_m2} — AB (ref >59)
Glucose: 89 mg/dL (ref 65–99)
Potassium: 4.2 mmol/L (ref 3.5–5.2)
Sodium: 141 mmol/L (ref 134–144)

## 2016-08-24 ENCOUNTER — Other Ambulatory Visit: Payer: Self-pay | Admitting: Internal Medicine

## 2016-08-24 DIAGNOSIS — E118 Type 2 diabetes mellitus with unspecified complications: Secondary | ICD-10-CM

## 2016-08-24 NOTE — Progress Notes (Signed)
Internal Medicine Clinic Attending  Case discussed with Dr. Patel,Vishal at the time of the visit.  We reviewed the resident's history and exam and pertinent patient test results.  I agree with the assessment, diagnosis, and plan of care documented in the resident's note.  

## 2016-09-09 ENCOUNTER — Telehealth (HOSPITAL_COMMUNITY): Payer: Self-pay | Admitting: *Deleted

## 2016-09-09 NOTE — Telephone Encounter (Signed)
Received VM from [place] at [facility], she states pt is due for her repeat colonoscopy in Sept/Oct due to her h/o polyps but they want to make sure it is ok for her to have done since she has heart failure and is currently on transplant list.    Discussed w/Dr Bensimhon, he recommends if pt is not at high risk for colon cancer would not do colonoscopy.  Called [facility] and let them know.

## 2016-09-18 ENCOUNTER — Other Ambulatory Visit: Payer: Self-pay | Admitting: Internal Medicine

## 2016-10-02 NOTE — Progress Notes (Signed)
Electrophysiology Office Note Date: 10/03/2016  ID:  [name], [name] [birth year], MRN [id number]  PCP: Zada Finders, MD Primary Cardiologist: Bensimhon Electrophysiologist: Caryl Comes  CC: Routine ICD follow-up  [name] is a 61 y.o. female seen today for Dr Caryl Comes.  She presents today for routine electrophysiology followup.  Since last being seen in our clinic, the patient reports doing reasonably well.  She is followed closely in the AHF clinic as well as intermittently at [facility] for transplant evaluation. At last visit with [facility] Metolazone was discontinued 2/2 concerns with renal function and she has been taking extra Lasix. With stopping Metolazone, she has had increased shortness of breath and does not feel that extra Lasix is helping.  She denies chest pain, palpitations, PND, orthopnea, nausea, vomiting, dizziness, syncope, edema, weight gain, or early satiety.  She has not had ICD shocks.   Device History: MDT single chamber ICD implanted 2011 for mixed cardiomyopathy; upgrade to CRTD 2015 History of appropriate therapy: Yes History of AAD therapy: Yes - amiodarone    Past Medical History:  Diagnosis Date  . AICD (automatic cardioverter/defibrillator) present   . Chronic systolic heart failure ([place])    a. ICM b. ECHO (06/2013): EF 15%, severe MR (06/2013) c. RHC (10/2013): RA 5, RV 23/2/5, PA 25/6 (14), PCWP 12, Fick CO/CI: 3.2/1.7, PVR 0.7 WU, PA 45%, 47% and 55% (with levophed 5), vagal response during cath. d. On home milrinone.  . CKD (chronic kidney disease), stage III   . Coronary artery disease    a. s/p CABG x 5 with MV annuloplasty 2007   . Diabetes mellitus   . Hypertension   . Implantable defibrillator   medtronic   . Ischemic cardiomyopathy    a. s/p ICD. b. LHC (10/04/13): 1. Severe native CAD with all bypass grafts patent. c. CRT upgrade 12/2013.  [name] LBBB (left bundle branch block)   . Lipoma   . PAD (peripheral artery disease) ([place])   . PICC line infection      a. 01/2014 - PICC exchanged.  . Polysubstance abuse    history of  (cocaine, tobacco and ETOH)  . V-tach [facility])    Past Surgical History:  Procedure Laterality Date  . BI-VENTRICULAR IMPLANTABLE CARDIOVERTER DEFIBRILLATOR UPGRADE  12/02/2013   MDT [place] CRTD upgrade by Dr Caryl Comes  . BI-VENTRICULAR IMPLANTABLE CARDIOVERTER DEFIBRILLATOR UPGRADE N/A 12/02/2013   Procedure: BI-VENTRICULAR IMPLANTABLE CARDIOVERTER DEFIBRILLATOR UPGRADE;  Surgeon: Deboraha Sprang, MD;  Location: [facility] CATH LAB;  Service: Cardiovascular;  Laterality: N/A;  . CARDIAC CATHETERIZATION    . CARDIAC DEFIBRILLATOR PLACEMENT     2011, Followed By Dr. Caryl Comes  . COLONOSCOPY WITH PROPOFOL N/A 06/08/2014   Procedure: COLONOSCOPY WITH PROPOFOL;  Surgeon: Wilford Corner, MD;  Location: [facility] ENDOSCOPY;  Service: Endoscopy;  Laterality: N/A;  . CORONARY ARTERY BYPASS GRAFT    . LEFT AND RIGHT HEART CATHETERIZATION WITH CORONARY/GRAFT ANGIOGRAM N/A 03/17/2011   Procedure: LEFT AND RIGHT HEART CATHETERIZATION WITH Beatrix Fetters;  Surgeon: Jolaine Artist, MD;  Location: [facility] CATH LAB;  Service: Cardiovascular;  Laterality: N/A;  . LEFT AND RIGHT HEART CATHETERIZATION WITH CORONARY/GRAFT ANGIOGRAM N/A 10/04/2013   Procedure: LEFT AND RIGHT HEART CATHETERIZATION WITH Beatrix Fetters;  Surgeon: Jolaine Artist, MD;  Location: [facility] CATH LAB;  Service: Cardiovascular;  Laterality: N/A;  . RIGHT HEART CATHETERIZATION N/A 05/23/2013   Procedure: RIGHT HEART CATH;  Surgeon: Jolaine Artist, MD;  Location: [facility] CATH LAB;  Service: Cardiovascular;  Laterality: N/A;  .  RIGHT HEART CATHETERIZATION N/A 11/01/2013   Procedure: RIGHT HEART CATH;  Surgeon: Daniel R Bensimhon, MD;  Location: [facility] CATH LAB;  Service: Cardiovascular;  Laterality: N/A;  . RIGHT HEART CATHETERIZATION N/A 11/25/2013   Procedure: RIGHT HEART CATH;  Surgeon: Daniel R Bensimhon, MD;  Location: [facility] CATH LAB;  Service: Cardiovascular;  Laterality: N/A;  . TEE WITHOUT  CARDIOVERSION N/A 06/02/2013   Procedure: TRANSESOPHAGEAL ECHOCARDIOGRAM (TEE);  Surgeon: Dalton S McLean, MD;  Location: [facility] ENDOSCOPY;  Service: Cardiovascular;  Laterality: N/A;    Current Outpatient Prescriptions  Medication Sig Dispense Refill  . ACCU-CHEK FASTCLIX LANCETS MISC Use to test blood glucose 2 times daily. Dx:E11.9 102 each 12  . acetaminophen (TYLENOL) 325 MG tablet Take 650 mg by mouth every 6 (six) hours as needed for mild pain.    . allopurinol (ZYLOPRIM) 100 MG tablet Take 2 tablets (200 mg total) by mouth at bedtime. 60 tablet 3  . amiodarone (PACERONE) 200 MG tablet Take 0.5 tablets (100 mg total) by mouth daily. 15 tablet 3  . amoxicillin (AMOXIL) 500 MG capsule Take 4 capsules (2,000 mg total) by mouth as needed (1 hour before dental work). 4 capsule 1  . aspirin EC 81 MG tablet Take 81 mg by mouth every morning.     . atorvastatin (LIPITOR) 80 MG tablet Take 1 tablet (80 mg total) by mouth daily. 90 tablet 3  . B-D ULTRAFINE III SHORT PEN 31G X 8 MM MISC USE TO INJECT INSULIN THREE TIMES DAILY 100 each 5  . Blood Glucose Monitoring Suppl (ACCU-CHEK NANO SMARTVIEW) W/DEVICE KIT Use to test blood glucose 2 times daily. Dx:E11.9 1 kit 0  . digoxin (LANOXIN) 0.125 MG tablet TAKE ONE-HALF TABLET BY MOUTH EVERY OTHER DAY 15 tablet 3  . furosemide (LASIX) 80 MG tablet Take 1 tablet (80 mg total) by mouth daily. 30 tablet 3  . glucose blood (ACCU-CHEK SMARTVIEW) test strip Use to test blood glucose 2 times daily. Dx:E11.9 100 each 12  . Insulin Glargine (LANTUS SOLOSTAR) 100 UNIT/ML Solostar Pen Inject 33 units into the skin at bedtime.Diagnosis code:E11.8 15 pen 3  . losartan (COZAAR) 25 MG tablet Take 0.5 tablets (12.5 mg total) by mouth 2 (two) times daily. 60 tablet 4  . nitroGLYCERIN (NITROSTAT) 0.4 MG SL tablet Place 1 tablet (0.4 mg total) under the tongue every 5 (five) minutes as needed. For chest pain. 25 tablet 11  . potassium chloride SA (K-DUR,KLOR-CON) 20 MEQ tablet  Take 20 mEq by mouth as needed (with metolazone).    . sitaGLIPtin (JANUVIA) 50 MG tablet Take 1 tablet (50 mg total) by mouth daily. 90 tablet 3  . spironolactone (ALDACTONE) 25 MG tablet Take 1 tablet (25 mg total) by mouth daily. 30 tablet 3   No current facility-administered medications for this visit.     Allergies:   Patient has no known allergies.   Social History: Social History   Social History  . Marital status: Widowed    Spouse name: N/A  . Number of children: N/A  . Years of education: N/A   Occupational History  . disable Unemployed   Social History Main Topics  . Smoking status: Former Smoker    Types: Cigarettes    Quit date: 03/03/2011  . Smokeless tobacco: Never Used  . Alcohol use No  . Drug use: No     Comment: Has history of cocaine use, denies recent  . Sexual activity: Not Currently   Other Topics Concern  .   Not on file   Social History Narrative  . No narrative on file    Family History: Family History  Problem Relation Age of Onset  . Heart attack Mother        Died age 56  . Diabetes Maternal Grandmother   . Heart attack Cousin   . Heart attack Maternal Uncle     Review of Systems: All other systems reviewed and are otherwise negative except as noted above.   Physical Exam: VS:  BP 104/62   Pulse 71   Ht 5' 4" (1.626 m)   Wt 199 lb 3.2 oz (90.4 kg)   BMI 34.19 kg/m  , BMI Body mass index is 34.19 kg/m.  GEN- The patient is obese appearing, alert and oriented x 3 today.   HEENT: normocephalic, atraumatic; sclera clear, conjunctiva pink; hearing intact; oropharynx clear; neck supple Lungs- Clear to ausculation bilaterally, normal work of breathing.  No wheezes, rales, rhonchi Heart- Regular rate and rhythm (paced) GI- soft, non-tender, non-distended, bowel sounds present  Extremities- no clubbing, cyanosis, or edema; DP/PT/radial pulses 2+ bilaterally MS- no significant deformity or atrophy Skin- warm and dry, no rash or  lesion; ICD pocket well healed Psych- euthymic mood, full affect Neuro- strength and sensation are intact  ICD interrogation- reviewed in detail today,  See PACEART report  EKG:  EKG is not ordered today.  Recent Labs: 01/10/2016: ALT 45 04/10/2016: B Natriuretic Peptide 143.5; Magnesium 2.2 04/11/2016: TSH 3.320 08/20/2016: BUN 28; Creatinine, Ser 1.27; Potassium 4.2; Sodium 141   Wt Readings from Last 3 Encounters:  10/03/16 199 lb 3.2 oz (90.4 kg)  08/20/16 202 lb 12.8 oz (92 kg)  07/04/16 201 lb (91.2 kg)     Other studies Reviewed: Additional studies/ records that were reviewed today include: Dr Klein's office notes, HF notes  Assessment and Plan:  1.  Chronic systolic dysfunction Followed closely by the HF clinic  Stable on an appropriate medical regimen. Listed on transplant list at [facility] She has had increased shortness of breath since stopping Metolazone. Extra lasix is not helping. Discussed with Dr Bensimhon today, will resume Metolazone once weekly.  BMET today. She has follow up with nephrology on 8/18. Normal ICD function See Pace Art report No changes today  2.  CAD/ICM No recent ischemic symptoms  Continue current therapy  3.  HTN Stable No change required today  4.  VT No recent recurrence Continue amiodarone low dose. Consider discontinuing at next office visit - LFT's, TSH today, annual eye exams recommended   Current medicines are reviewed at length with the patient today.   The patient does not have concerns regarding her medicines.  The following changes were made today:     Labs/ tests ordered today include: TSH, LFT's, BMET   Disposition:   Follow up with HF clinic, ICM clinic, [facility] transmissions, Dr Klein 6 months   Signed, Amber Seiler, NP 10/03/2016 11:30 AM  [facility] [address]   [address] [phone] (office) [phone] (fax) 

## 2016-10-03 ENCOUNTER — Ambulatory Visit (INDEPENDENT_AMBULATORY_CARE_PROVIDER_SITE_OTHER): Payer: Medicare Other | Admitting: Nurse Practitioner

## 2016-10-03 ENCOUNTER — Encounter: Payer: Self-pay | Admitting: Nurse Practitioner

## 2016-10-03 VITALS — BP 104/62 | HR 71 | Ht 64.0 in | Wt 199.2 lb

## 2016-10-03 DIAGNOSIS — I10 Essential (primary) hypertension: Secondary | ICD-10-CM

## 2016-10-03 DIAGNOSIS — I472 Ventricular tachycardia, unspecified: Secondary | ICD-10-CM

## 2016-10-03 DIAGNOSIS — I5022 Chronic systolic (congestive) heart failure: Secondary | ICD-10-CM

## 2016-10-03 DIAGNOSIS — I25810 Atherosclerosis of coronary artery bypass graft(s) without angina pectoris: Secondary | ICD-10-CM

## 2016-10-03 DIAGNOSIS — I2510 Atherosclerotic heart disease of native coronary artery without angina pectoris: Secondary | ICD-10-CM

## 2016-10-03 LAB — HEPATIC FUNCTION PANEL
ALBUMIN: 4.6 g/dL (ref 3.5–5.5)
ALK PHOS: 103 IU/L (ref 39–117)
ALT: 20 IU/L (ref 0–32)
AST: 20 IU/L (ref 0–40)
BILIRUBIN, DIRECT: 0.12 mg/dL (ref 0.00–0.40)
Bilirubin Total: 0.4 mg/dL (ref 0.0–1.2)
TOTAL PROTEIN: 7.6 g/dL (ref 6.0–8.5)

## 2016-10-03 LAB — CUP PACEART INCLINIC DEVICE CHECK
Date Time Interrogation Session: 20180803112822
Implantable Lead Implant Date: 20151002
Implantable Lead Implant Date: 20151002
Implantable Lead Location: 753860
Implantable Lead Model: 4396
Implantable Lead Model: 6947
Implantable Pulse Generator Implant Date: 20151002
MDC IDC LEAD IMPLANT DT: 20111012
MDC IDC LEAD LOCATION: 753857
MDC IDC LEAD LOCATION: 753859

## 2016-10-03 LAB — BASIC METABOLIC PANEL
BUN/Creatinine Ratio: 26 — ABNORMAL HIGH (ref 9–23)
BUN: 48 mg/dL — AB (ref 6–24)
CALCIUM: 9.7 mg/dL (ref 8.7–10.2)
CO2: 21 mmol/L (ref 20–29)
CREATININE: 1.87 mg/dL — AB (ref 0.57–1.00)
Chloride: 94 mmol/L — ABNORMAL LOW (ref 96–106)
GFR calc Af Amer: 33 mL/min/{1.73_m2} — ABNORMAL LOW (ref >59)
GFR calc non Af Amer: 29 mL/min/{1.73_m2} — ABNORMAL LOW (ref >59)
Glucose: 193 mg/dL — ABNORMAL HIGH (ref 65–99)
Potassium: 4.6 mmol/L (ref 3.5–5.2)
Sodium: 135 mmol/L (ref 134–144)

## 2016-10-03 LAB — TSH: TSH: 5.47 u[IU]/mL — ABNORMAL HIGH (ref 0.450–4.500)

## 2016-10-03 MED ORDER — METOLAZONE 2.5 MG PO TABS: 2.5000 mg | ORAL_TABLET | ORAL | 3 refills | Status: DC

## 2016-10-03 NOTE — Patient Instructions (Signed)
Medication Instructions:  Your physician has recommended you make the following change in your medication: we are restarting your Metolazone 2.5 mg once a week.    Labwork: Your physician recommends that you return for lab work today: BMET, THS, LFT   Testing/Procedures: None Ordered   Follow-Up: Your physician wants you to follow-up in: 6 months with Dr. Caryl Comes. You will receive a reminder letter in the mail two months in advance. If you don't receive a letter, please call our office to schedule the follow-up appointment.   Any Other Special Instructions Will Be Listed Below (If Applicable).     If you need a refill on your cardiac medications before your next appointment, please call your pharmacy.

## 2016-10-06 ENCOUNTER — Telehealth: Payer: Self-pay

## 2016-10-06 DIAGNOSIS — Z79899 Other long term (current) drug therapy: Secondary | ICD-10-CM

## 2016-10-06 NOTE — Telephone Encounter (Signed)
Lab orders placed for FT4 and BMET for recheck on 10/09/16 per Chanetta Marshall, NP.

## 2016-10-09 ENCOUNTER — Other Ambulatory Visit: Payer: Medicare Other | Admitting: *Deleted

## 2016-10-09 DIAGNOSIS — I051 Rheumatic mitral insufficiency: Secondary | ICD-10-CM | POA: Diagnosis not present

## 2016-10-09 DIAGNOSIS — I472 Ventricular tachycardia, unspecified: Secondary | ICD-10-CM

## 2016-10-09 DIAGNOSIS — I10 Essential (primary) hypertension: Secondary | ICD-10-CM

## 2016-10-09 LAB — BASIC METABOLIC PANEL
BUN/Creatinine Ratio: 28 — ABNORMAL HIGH (ref 9–23)
BUN: 45 mg/dL — ABNORMAL HIGH (ref 6–24)
CALCIUM: 9.5 mg/dL (ref 8.7–10.2)
CO2: 21 mmol/L (ref 20–29)
Chloride: 93 mmol/L — ABNORMAL LOW (ref 96–106)
Creatinine, Ser: 1.6 mg/dL — ABNORMAL HIGH (ref 0.57–1.00)
GFR calc Af Amer: 40 mL/min/{1.73_m2} — ABNORMAL LOW (ref >59)
GFR, EST NON AFRICAN AMERICAN: 35 mL/min/{1.73_m2} — AB (ref >59)
Glucose: 149 mg/dL — ABNORMAL HIGH (ref 65–99)
POTASSIUM: 3.7 mmol/L (ref 3.5–5.2)
Sodium: 133 mmol/L — ABNORMAL LOW (ref 134–144)

## 2016-10-09 LAB — T4, FREE: Free T4: 1.44 ng/dL (ref 0.82–1.77)

## 2016-10-09 NOTE — Addendum Note (Signed)
Addended by: Eulis Foster on: 10/09/2016 10:32 AM   Modules accepted: Orders

## 2016-10-13 ENCOUNTER — Ambulatory Visit (INDEPENDENT_AMBULATORY_CARE_PROVIDER_SITE_OTHER): Payer: Medicare Other | Admitting: *Deleted

## 2016-10-13 DIAGNOSIS — I428 Other cardiomyopathies: Secondary | ICD-10-CM

## 2016-10-13 DIAGNOSIS — I5022 Chronic systolic (congestive) heart failure: Secondary | ICD-10-CM

## 2016-10-14 LAB — CUP PACEART REMOTE DEVICE CHECK
Brady Statistic AP VS Percent: 0.02 %
Brady Statistic AS VP Percent: 97.65 %
Brady Statistic RA Percent Paced: 0.48 %
Date Time Interrogation Session: 20180813052503
HIGH POWER IMPEDANCE MEASURED VALUE: 38 Ohm
HIGH POWER IMPEDANCE MEASURED VALUE: 45 Ohm
Implantable Lead Implant Date: 20151002
Implantable Lead Location: 753859
Implantable Lead Model: 5076
Lead Channel Impedance Value: 437 Ohm
Lead Channel Impedance Value: 494 Ohm
Lead Channel Impedance Value: 551 Ohm
Lead Channel Pacing Threshold Pulse Width: 0.4 ms
Lead Channel Pacing Threshold Pulse Width: 1 ms
Lead Channel Sensing Intrinsic Amplitude: 18.75 mV
Lead Channel Setting Pacing Amplitude: 2 V
Lead Channel Setting Pacing Pulse Width: 0.4 ms
Lead Channel Setting Pacing Pulse Width: 1 ms
MDC IDC LEAD IMPLANT DT: 20111012
MDC IDC LEAD IMPLANT DT: 20151002
MDC IDC LEAD LOCATION: 753857
MDC IDC LEAD LOCATION: 753860
MDC IDC MSMT BATTERY REMAINING LONGEVITY: 11 mo
MDC IDC MSMT BATTERY VOLTAGE: 2.88 V
MDC IDC MSMT LEADCHNL LV IMPEDANCE VALUE: 836 Ohm
MDC IDC MSMT LEADCHNL LV PACING THRESHOLD AMPLITUDE: 4 V
MDC IDC MSMT LEADCHNL RA IMPEDANCE VALUE: 399 Ohm
MDC IDC MSMT LEADCHNL RA PACING THRESHOLD AMPLITUDE: 0.75 V
MDC IDC MSMT LEADCHNL RA SENSING INTR AMPL: 1 mV
MDC IDC MSMT LEADCHNL RA SENSING INTR AMPL: 1 mV
MDC IDC MSMT LEADCHNL RV IMPEDANCE VALUE: 437 Ohm
MDC IDC MSMT LEADCHNL RV PACING THRESHOLD AMPLITUDE: 0.875 V
MDC IDC MSMT LEADCHNL RV PACING THRESHOLD PULSEWIDTH: 0.4 ms
MDC IDC MSMT LEADCHNL RV SENSING INTR AMPL: 18.75 mV
MDC IDC PG IMPLANT DT: 20151002
MDC IDC SET LEADCHNL LV PACING AMPLITUDE: 4.5 V
MDC IDC SET LEADCHNL RV PACING AMPLITUDE: 2.5 V
MDC IDC SET LEADCHNL RV SENSING SENSITIVITY: 0.45 mV
MDC IDC STAT BRADY AP VP PERCENT: 0.45 %
MDC IDC STAT BRADY AS VS PERCENT: 1.87 %
MDC IDC STAT BRADY RV PERCENT PACED: 97.55 %

## 2016-10-14 NOTE — Progress Notes (Signed)
Remote defibrillator check.

## 2016-10-17 ENCOUNTER — Other Ambulatory Visit (HOSPITAL_COMMUNITY): Payer: Self-pay | Admitting: Internal Medicine

## 2016-10-20 MED ORDER — AMIODARONE HCL 200 MG PO TABS: 100.0000 mg | ORAL_TABLET | Freq: Every day | ORAL | 3 refills | Status: DC

## 2016-10-23 ENCOUNTER — Encounter: Payer: Self-pay | Admitting: Cardiology

## 2016-11-07 ENCOUNTER — Encounter: Payer: Self-pay | Admitting: Cardiology

## 2016-11-12 ENCOUNTER — Ambulatory Visit (INDEPENDENT_AMBULATORY_CARE_PROVIDER_SITE_OTHER): Payer: Medicare Other | Admitting: Internal Medicine

## 2016-11-12 ENCOUNTER — Encounter: Payer: Self-pay | Admitting: Internal Medicine

## 2016-11-12 VITALS — BP 100/53 | HR 78 | Temp 98.3°F | Ht 64.0 in | Wt 200.1 lb

## 2016-11-12 DIAGNOSIS — E118 Type 2 diabetes mellitus with unspecified complications: Secondary | ICD-10-CM

## 2016-11-12 DIAGNOSIS — I2510 Atherosclerotic heart disease of native coronary artery without angina pectoris: Secondary | ICD-10-CM

## 2016-11-12 DIAGNOSIS — Z7982 Long term (current) use of aspirin: Secondary | ICD-10-CM

## 2016-11-12 DIAGNOSIS — I10 Essential (primary) hypertension: Secondary | ICD-10-CM

## 2016-11-12 DIAGNOSIS — Z23 Encounter for immunization: Secondary | ICD-10-CM

## 2016-11-12 DIAGNOSIS — E1151 Type 2 diabetes mellitus with diabetic peripheral angiopathy without gangrene: Secondary | ICD-10-CM

## 2016-11-12 DIAGNOSIS — Z95810 Presence of automatic (implantable) cardiac defibrillator: Secondary | ICD-10-CM

## 2016-11-12 DIAGNOSIS — Z951 Presence of aortocoronary bypass graft: Secondary | ICD-10-CM

## 2016-11-12 DIAGNOSIS — R011 Cardiac murmur, unspecified: Secondary | ICD-10-CM | POA: Diagnosis not present

## 2016-11-12 DIAGNOSIS — Z952 Presence of prosthetic heart valve: Secondary | ICD-10-CM | POA: Diagnosis not present

## 2016-11-12 DIAGNOSIS — I130 Hypertensive heart and chronic kidney disease with heart failure and stage 1 through stage 4 chronic kidney disease, or unspecified chronic kidney disease: Secondary | ICD-10-CM

## 2016-11-12 DIAGNOSIS — E1122 Type 2 diabetes mellitus with diabetic chronic kidney disease: Secondary | ICD-10-CM

## 2016-11-12 DIAGNOSIS — I5022 Chronic systolic (congestive) heart failure: Secondary | ICD-10-CM | POA: Diagnosis not present

## 2016-11-12 DIAGNOSIS — Z794 Long term (current) use of insulin: Secondary | ICD-10-CM | POA: Diagnosis not present

## 2016-11-12 DIAGNOSIS — M10079 Idiopathic gout, unspecified ankle and foot: Secondary | ICD-10-CM | POA: Diagnosis not present

## 2016-11-12 DIAGNOSIS — I255 Ischemic cardiomyopathy: Secondary | ICD-10-CM

## 2016-11-12 DIAGNOSIS — Z79899 Other long term (current) drug therapy: Secondary | ICD-10-CM | POA: Diagnosis not present

## 2016-11-12 DIAGNOSIS — N183 Chronic kidney disease, stage 3 (moderate): Secondary | ICD-10-CM

## 2016-11-12 DIAGNOSIS — M109 Gout, unspecified: Secondary | ICD-10-CM

## 2016-11-12 DIAGNOSIS — Z87891 Personal history of nicotine dependence: Secondary | ICD-10-CM

## 2016-11-12 DIAGNOSIS — Z0000 Encounter for general adult medical examination without abnormal findings: Secondary | ICD-10-CM

## 2016-11-12 LAB — GLUCOSE, CAPILLARY: Glucose-Capillary: 169 mg/dL — ABNORMAL HIGH (ref 65–99)

## 2016-11-12 LAB — POCT GLYCOSYLATED HEMOGLOBIN (HGB A1C): HEMOGLOBIN A1C: 7.2

## 2016-11-12 MED ORDER — DICLOFENAC SODIUM 1 % TD GEL: 4.0000 g | Freq: Four times a day (QID) | TRANSDERMAL | 2 refills | Status: DC

## 2016-11-12 NOTE — Patient Instructions (Signed)
It was a pleasure to see you again [name].  Your Hemoglobin A1c is 7.2 today. Please continue your current medications and continue to work on healthy eating patterns.  We are giving you the flu shot today.  Please follow up with me in 3 months or sooner if needed.  Diabetes Mellitus and Food It is important for you to manage your blood sugar (glucose) level. Your blood glucose level can be greatly affected by what you eat. Eating healthier foods in the appropriate amounts throughout the day at about the same time each day will help you control your blood glucose level. It can also help slow or prevent worsening of your diabetes mellitus. Healthy eating may even help you improve the level of your blood pressure and reach or maintain a healthy weight. General recommendations for healthful eating and cooking habits include:  Eating meals and snacks regularly. Avoid going long periods of time without eating to lose weight.  Eating a diet that consists mainly of plant-based foods, such as fruits, vegetables, nuts, legumes, and whole grains.  Using low-heat cooking methods, such as baking, instead of high-heat cooking methods, such as deep frying.  Work with your dietitian to make sure you understand how to use the Nutrition Facts information on food labels. How can food affect me? Carbohydrates Carbohydrates affect your blood glucose level more than any other type of food. Your dietitian will help you determine how many carbohydrates to eat at each meal and teach you how to count carbohydrates. Counting carbohydrates is important to keep your blood glucose at a healthy level, especially if you are using insulin or taking certain medicines for diabetes mellitus. Alcohol Alcohol can cause sudden decreases in blood glucose (hypoglycemia), especially if you use insulin or take certain medicines for diabetes mellitus. Hypoglycemia can be a life-threatening condition. Symptoms of hypoglycemia  (sleepiness, dizziness, and disorientation) are similar to symptoms of having too much alcohol. If your health care provider has given you approval to drink alcohol, do so in moderation and use the following guidelines:  Women should not have more than one drink per day, and men should not have more than two drinks per day. One drink is equal to: ? 12 oz of beer. ? 5 oz of wine. ? 1 oz of hard liquor.  Do not drink on an empty stomach.  Keep yourself hydrated. Have water, diet soda, or unsweetened iced tea.  Regular soda, juice, and other mixers might contain a lot of carbohydrates and should be counted.  What foods are not recommended? As you make food choices, it is important to remember that all foods are not the same. Some foods have fewer nutrients per serving than other foods, even though they might have the same number of calories or carbohydrates. It is difficult to get your body what it needs when you eat foods with fewer nutrients. Examples of foods that you should avoid that are high in calories and carbohydrates but low in nutrients include:  Trans fats (most processed foods list trans fats on the Nutrition Facts label).  Regular soda.  Juice.  Candy.  Sweets, such as cake, pie, doughnuts, and cookies.  Fried foods.  What foods can I eat? Eat nutrient-rich foods, which will nourish your body and keep you healthy. The food you should eat also will depend on several factors, including:  The calories you need.  The medicines you take.  Your weight.  Your blood glucose level.  Your blood pressure level.  Your cholesterol level.  You should eat a variety of foods, including:  Protein. ? Lean cuts of meat. ? Proteins low in saturated fats, such as fish, egg whites, and beans. Avoid processed meats.  Fruits and vegetables. ? Fruits and vegetables that may help control blood glucose levels, such as apples, mangoes, and yams.  Dairy products. ? Choose  fat-free or low-fat dairy products, such as milk, yogurt, and cheese.  Grains, bread, pasta, and rice. ? Choose whole grain products, such as multigrain bread, whole oats, and brown rice. These foods may help control blood pressure.  Fats. ? Foods containing healthful fats, such as nuts, avocado, olive oil, canola oil, and fish.  Does everyone with diabetes mellitus have the same meal plan? Because every person with diabetes mellitus is different, there is not one meal plan that works for everyone. It is very important that you meet with a dietitian who will help you create a meal plan that is just right for you. This information is not intended to replace advice given to you by your health care provider. Make sure you discuss any questions you have with your health care provider. Document Released: 11/14/2004 Document Revised: 07/26/2015 Document Reviewed: 01/14/2013 Elsevier Interactive Patient Education  2017 [employer].

## 2016-11-12 NOTE — Progress Notes (Signed)
CC: Diabetes  HPI:  [name] is a 61 y.o. female with PMH as listed below including CAD s/p CABG x5 with mitral annuloplasty in 2007, ischemic cardiomyopathy w/ CHF s/p CRT-D, T2DM, HTN, CKD 3, and PAD who presents for follow up management of her T2DM. Please see problem based charting for status of patient's chronic medical issues.  T2DM: Last HgbA1c was 7.3 on 08/20/16. Patient currently takes Lantus 33 units qhs and Januvia 50 mg daily. She occasionally checks her CBGs with less frequency recently. She did not bring her meter this visit. She denies any hyperglycemic or hypoglycemic episodes.  Chronic systolic heart failure: Patient follows with Heart Failure clinic Dr. Haroldine Laws, EP Dr. Caryl Comes, as well as Dr. Radene Knee with the Heart Transplant clinic in [facility]. She takes Amiodarone 100 mg daily, Digoxin 0.0625 mg every other day, Losartan 12.5 mg BID, Spironolactone 25 mg daily, Lasix 80 mg daily Metolazone 2.5 mg weekly, and Lipitor 80 mg daily. She reports improvement in her dyspnea and weight fluctuations now that she is back on Metolazone. Her weight in clinic this visit is 200 lbs, however she reports a home weight of 196. She does check her weights at home consistently and there is likely a variation between our scales. She denies any current CP, palpitations, dyspnea, orthopnea, or peripheral edema.   HTN: Patient currently takes Losartan 12.5 mg BID, Spironolactone 25 mg daily, and Lasix 80 mg daily for her heart failure and HTN. She has not been able to tolerate higher doses of Losartan and Spironolactone due to borderline low blood pressures. Has not tolerated Carvedilol due to hypotension and fatigue. BP this visit is 100/53. She denies any recent lightheadedness, dizziness, near-syncope, or syncopal episodes.  Gout: Patient with a reported history of gout affecting the great toe (could not find diagnostic labs in EPIC).  She is taking Allopurinol 200 mg daily for  prophylaxis. Last uric acid was 3.5 on 10/07/2013. No acute gout attacks reported.   Healthcare Maintenance: Patient agreeable for flu shot.    Past Medical History:  Diagnosis Date  . AICD (automatic cardioverter/defibrillator) present   . Chronic systolic heart failure ([place])    a. ICM b. ECHO (06/2013): EF 15%, severe MR (06/2013) c. RHC (10/2013): RA 5, RV 23/2/5, PA 25/6 (14), PCWP 12, Fick CO/CI: 3.2/1.7, PVR 0.7 WU, PA 45%, 47% and 55% (with levophed 5), vagal response during cath. d. On home milrinone.  . CKD (chronic kidney disease), stage III   . Coronary artery disease    a. s/p CABG x 5 with MV annuloplasty 2007   . Diabetes mellitus   . Hypertension   . Implantable defibrillator   medtronic   . Ischemic cardiomyopathy    a. s/p ICD. b. LHC (10/04/13): 1. Severe native CAD with all bypass grafts patent. c. CRT upgrade 12/2013.  [name] LBBB (left bundle branch block)   . Lipoma   . PAD (peripheral artery disease) ([place])   . PICC line infection    a. 01/2014 - PICC exchanged.  . Polysubstance abuse    history of  (cocaine, tobacco and ETOH)  . V-tach [facility])    Review of Systems:   Review of Systems  Respiratory: Negative for shortness of breath.   Cardiovascular: Negative for chest pain, palpitations, orthopnea and leg swelling.  Genitourinary: Negative for dysuria.  Musculoskeletal: Negative for falls.  Neurological: Negative for dizziness and loss of consciousness.     Physical Exam:  Vitals:   11/12/16  1333  BP: (!) 100/53  Pulse: 78  Temp: 98.3 F (36.8 C)  TempSrc: Oral  SpO2: 97%  Weight: 200 lb 1.6 oz (90.8 kg)  Height: 5\' 4"  (1.626 m)   Physical Exam  Constitutional: She is oriented to person, place, and time. She appears well-developed and well-nourished. No distress.  HENT:  Head: Normocephalic and atraumatic.  Cardiovascular: Normal rate and regular rhythm.   Pulses:      Dorsalis pedis pulses are 2+ on the right side, and 2+ on the left side.    Systolic murmur LUS border. ICD left chest wall.  Pulmonary/Chest: Effort normal. No respiratory distress. She has no wheezes.  Musculoskeletal: She exhibits no edema or tenderness.  Neurological: She is alert and oriented to person, place, and time.  Skin: Skin is warm. She is not diaphoretic.  Psychiatric: She has a normal mood and affect.     Assessment & Plan:   See Encounters Tab for problem based charting.  Patient discussed with Dr. Eppie Gibson  Diabetes mellitus type 2, controlled ([place]) Repeat A1c this visit is 7.2. Her diabetes is currently stable and controlled. She does report occasional dietary indiscretion and will work on healthy eating patterns. We will continue her current medications. - Continue Lantus 33 units qhs and Januvia 50 mg daily - Foot exam completed; DP pulses +2 bilaterally - Repeat Hgb A1c in 3 months  Chronic systolic heart failure (HCC) Her chronic systolic heart failure is currently stable and she appears euvolemic on exam. She denies any symptoms of heart failure exacerbation and feels improved now that she is back on Metolazone. We will continue her current medications and she will continue to follow with her Cardiologists. - Amiodarone 100 mg daily, Digoxin 0.0625 mg every other day, Losartan 12.5 mg BID, Spironolactone 25 mg daily, Lasix 80 mg daily Metolazone 2.5 mg weekly, and Lipitor 80 mg daily - f/u with Heart Failure Clinic and [facility] Heart Transplant team as scheduled  Hypertension BP Readings from Last 3 Encounters:  11/12/16 (!) 100/53  10/03/16 104/62  08/20/16 (!) 108/57   Patient's BP has been borderline low for sometime. She remains asymptomatic. We will continue to monitor and continue her current medications.  Gout Patient with a reported history of gout affecting the great toe (could not find diagnostic labs in EPIC).  She is taking Allopurinol 200 mg daily for prophylaxis. Last uric acid was 3.5 on 10/07/2013. No acute gout attacks  reported. We will consider rechecking a uric acid level in the future to determine if she can reduce her allopurinol dose, especially if she will be a candidate for heart transplant and require immunosuppressive therapy. She is on Losartan which is benefical at it has uricosuric properties as well. We will continue current therapy for now. - Continue Allopurinol 200 mg daily and Losartan - Consider checking uric acid level on follow up  Healthcare maintenance Flu shot given this visit.

## 2016-11-13 NOTE — Assessment & Plan Note (Addendum)
Repeat A1c this visit is 7.2. Her diabetes is currently stable and controlled. She does report occasional dietary indiscretion and will work on healthy eating patterns. We will continue her current medications. - Continue Lantus 33 units qhs and Januvia 50 mg daily - Foot exam completed; DP pulses +2 bilaterally - Repeat Hgb A1c in 3 months

## 2016-11-13 NOTE — Assessment & Plan Note (Signed)
Her chronic systolic heart failure is currently stable and she appears euvolemic on exam. She denies any symptoms of heart failure exacerbation and feels improved now that she is back on Metolazone. We will continue her current medications and she will continue to follow with her Cardiologists. - Amiodarone 100 mg daily, Digoxin 0.0625 mg every other day, Losartan 12.5 mg BID, Spironolactone 25 mg daily, Lasix 80 mg daily Metolazone 2.5 mg weekly, and Lipitor 80 mg daily - f/u with Heart Failure Clinic and [facility] Heart Transplant team as scheduled

## 2016-11-13 NOTE — Assessment & Plan Note (Signed)
Flu shot given this visit. 

## 2016-11-13 NOTE — Assessment & Plan Note (Signed)
BP Readings from Last 3 Encounters:  11/12/16 (!) 100/53  10/03/16 104/62  08/20/16 (!) 108/57   Patient's BP has been borderline low for sometime. She remains asymptomatic. We will continue to monitor and continue her current medications.

## 2016-11-13 NOTE — Progress Notes (Signed)
Case discussed with Dr. Posey Pronto soon after the resident saw the patient. We reviewed the resident's history and exam and pertinent patient test results. I agree with the assessment, diagnosis, and plan of care documented in the resident's note.

## 2016-11-13 NOTE — Assessment & Plan Note (Addendum)
Patient with a reported history of gout affecting the great toe (could not find diagnostic labs in EPIC).  She is taking Allopurinol 200 mg daily for prophylaxis. Last uric acid was 3.5 on 10/07/2013. No acute gout attacks reported. We will consider rechecking a uric acid level in the future to determine if she can reduce her allopurinol dose, especially if she will be a candidate for heart transplant and require immunosuppressive therapy. She is on Losartan which is benefical at it has uricosuric properties as well. We will continue current therapy for now. - Continue Allopurinol 200 mg daily and Losartan - Consider checking uric acid level on follow up

## 2016-11-18 DIAGNOSIS — Z95810 Presence of automatic (implantable) cardiac defibrillator: Secondary | ICD-10-CM | POA: Diagnosis not present

## 2016-11-18 DIAGNOSIS — I130 Hypertensive heart and chronic kidney disease with heart failure and stage 1 through stage 4 chronic kidney disease, or unspecified chronic kidney disease: Secondary | ICD-10-CM | POA: Diagnosis not present

## 2016-11-18 DIAGNOSIS — Z8249 Family history of ischemic heart disease and other diseases of the circulatory system: Secondary | ICD-10-CM | POA: Diagnosis not present

## 2016-11-18 DIAGNOSIS — Z841 Family history of disorders of kidney and ureter: Secondary | ICD-10-CM | POA: Diagnosis not present

## 2016-11-18 DIAGNOSIS — I252 Old myocardial infarction: Secondary | ICD-10-CM | POA: Diagnosis not present

## 2016-11-18 DIAGNOSIS — E1151 Type 2 diabetes mellitus with diabetic peripheral angiopathy without gangrene: Secondary | ICD-10-CM | POA: Diagnosis not present

## 2016-11-18 DIAGNOSIS — Z87891 Personal history of nicotine dependence: Secondary | ICD-10-CM | POA: Diagnosis not present

## 2016-11-18 DIAGNOSIS — I429 Cardiomyopathy, unspecified: Secondary | ICD-10-CM | POA: Diagnosis not present

## 2016-11-18 DIAGNOSIS — Z833 Family history of diabetes mellitus: Secondary | ICD-10-CM | POA: Diagnosis not present

## 2016-11-18 DIAGNOSIS — Z79899 Other long term (current) drug therapy: Secondary | ICD-10-CM | POA: Diagnosis not present

## 2016-11-18 DIAGNOSIS — E1122 Type 2 diabetes mellitus with diabetic chronic kidney disease: Secondary | ICD-10-CM | POA: Diagnosis not present

## 2016-11-18 DIAGNOSIS — Z7982 Long term (current) use of aspirin: Secondary | ICD-10-CM | POA: Diagnosis not present

## 2016-11-18 DIAGNOSIS — I5022 Chronic systolic (congestive) heart failure: Secondary | ICD-10-CM | POA: Diagnosis not present

## 2016-11-18 DIAGNOSIS — Z951 Presence of aortocoronary bypass graft: Secondary | ICD-10-CM | POA: Diagnosis not present

## 2016-11-18 DIAGNOSIS — N189 Chronic kidney disease, unspecified: Secondary | ICD-10-CM | POA: Diagnosis not present

## 2016-11-18 DIAGNOSIS — N183 Chronic kidney disease, stage 3 (moderate): Secondary | ICD-10-CM | POA: Diagnosis not present

## 2016-11-18 DIAGNOSIS — I2510 Atherosclerotic heart disease of native coronary artery without angina pectoris: Secondary | ICD-10-CM | POA: Diagnosis not present

## 2016-11-18 DIAGNOSIS — Z794 Long term (current) use of insulin: Secondary | ICD-10-CM | POA: Diagnosis not present

## 2016-11-18 DIAGNOSIS — I499 Cardiac arrhythmia, unspecified: Secondary | ICD-10-CM | POA: Diagnosis not present

## 2016-11-19 ENCOUNTER — Encounter (HOSPITAL_COMMUNITY): Payer: Self-pay | Admitting: Internal Medicine

## 2016-11-19 ENCOUNTER — Ambulatory Visit (HOSPITAL_BASED_OUTPATIENT_CLINIC_OR_DEPARTMENT_OTHER)
Admission: RE | Admit: 2016-11-19 | Discharge: 2016-11-19 | Disposition: A | Discharge status: Home / Self Care | Payer: Medicare Other | Source: Ambulatory Visit | Attending: Internal Medicine | Admitting: Internal Medicine

## 2016-11-19 ENCOUNTER — Ambulatory Visit (HOSPITAL_COMMUNITY)
Admission: RE | Admit: 2016-11-19 | Discharge: 2016-11-19 | Disposition: A | Discharge status: Home / Self Care | Payer: Medicare Other | Source: Ambulatory Visit | Attending: Internal Medicine | Admitting: Internal Medicine

## 2016-11-19 VITALS — BP 109/63 | HR 69 | Wt 199.0 lb

## 2016-11-19 DIAGNOSIS — Z951 Presence of aortocoronary bypass graft: Secondary | ICD-10-CM | POA: Diagnosis not present

## 2016-11-19 DIAGNOSIS — I130 Hypertensive heart and chronic kidney disease with heart failure and stage 1 through stage 4 chronic kidney disease, or unspecified chronic kidney disease: Secondary | ICD-10-CM | POA: Insufficient documentation

## 2016-11-19 DIAGNOSIS — I480 Paroxysmal atrial fibrillation: Secondary | ICD-10-CM

## 2016-11-19 DIAGNOSIS — R29898 Other symptoms and signs involving the musculoskeletal system: Secondary | ICD-10-CM | POA: Diagnosis not present

## 2016-11-19 DIAGNOSIS — I5022 Chronic systolic (congestive) heart failure: Secondary | ICD-10-CM | POA: Diagnosis not present

## 2016-11-19 DIAGNOSIS — I2510 Atherosclerotic heart disease of native coronary artery without angina pectoris: Secondary | ICD-10-CM | POA: Diagnosis not present

## 2016-11-19 LAB — ECHOCARDIOGRAM COMPLETE
AVLVOTPG: 6 mmHg
CHL CUP DOP CALC LVOT VTI: 23.1 cm
CHL CUP TV REG PEAK VELOCITY: 271 cm/s
E decel time: 366 msec
EERAT: 11.61
FS: 11 % — AB (ref 28–44)
IVS/LV PW RATIO, ED: 1.04
LA ID, A-P, ES: 35 mm
LA vol A4C: 61.6 ml
LA vol: 68.7 mL
LADIAMINDEX: 1.79 cm/m2
LAVOLIN: 35.1 mL/m2
LEFT ATRIUM END SYS DIAM: 35 mm
LV E/e' medial: 11.61
LV PW d: 8.15 mm — AB (ref 0.6–1.1)
LV SIMPSON'S DISK: 33
LV dias vol index: 96 mL/m2
LV dias vol: 188 mL — AB (ref 46–106)
LV e' LATERAL: 12.4 cm/s
LV sys vol index: 64 mL/m2
LV sys vol: 126 mL — AB (ref 14–42)
LVEEAVG: 11.61
LVOT SV: 88 mL
LVOT area: 3.8 cm2
LVOT diameter: 22 mm
LVOT peak vel: 125 cm/s
MRPISAEROA: 0.1 cm2
MV Dec: 366
MV pk A vel: 69.6 m/s
MVAP: 2.04 cm2
MVPG: 8 mmHg
MVPKEVEL: 144 m/s
P 1/2 time: 101 ms
RV sys press: 32 mmHg
Stroke v: 62 ml
TDI e' lateral: 12.4
TDI e' medial: 13.2
TRMAXVEL: 271 cm/s
VTI: 134 cm

## 2016-11-19 NOTE — Progress Notes (Signed)
  Echocardiogram 2D Echocardiogram has been performed.  [name] 11/19/2016, 2:43 PM

## 2016-11-19 NOTE — Progress Notes (Signed)
Patient ID: [name], female   DOB: Apr 22, 1955, 61 y.o.   MRN: [id number]    Advanced Heart Failure Clinic Note  PCP: Dr. Posey Pronto  Primary HF:  Dr. Haroldine Laws   HPI: 61 y.o. female with history of HTN, DM2, past polysubstance abuse (ETOH, tobacco, cocaine), CAD s/p CABG x5 with MV annuloplasty (2007), ischemic cardiomyopathy s/p Medtronic CRT-D, chronic systolic HF EF 62%, severe MR, CKD stage III and PAD. Blood type O+.  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 At time of cath LAD, LCX and RCA occluded. EF 45%. Had abrupt stent occlusion the next day and had to go back to the lab. 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   Amitted 8/2-10/07/13 for syncope s/p ICD shock. Concern that VT was possibly from ischemia and taken for [facility]. Started on Amiodarone.   On 11/25/13 underwent RHC for low output symptoms. Hemodynamics borderline. Swan left in and she was observed. Cardiac output dropped while in hospital so started on milrinone. Discharged home. Underwent CRT-D upgrade on 12/02/13  In 12/15, she was admitted with catheter infection. She was admitted and catheter was replaced.  She has completed antibiotic.    She has been titrated off milrinone. She has been off since January 2016.   She presents today for regular follow up. Feels well. Able to do all ADLs without too much problem Can go up steps at her townhouse slowly. Getting 5,000 steps per day. Takes metolazone every Tuesday. Continues to follow with Dr. Mearl Latin at [facility]. She is listed as status 2. She was seen yesterday and told she would need heart-kidney transplant as kidneys likely not strong enough to handle heart transplant and immunosuppressive regimen. Her son is in [facility].   ICD interrogation ok: No VT/VF. No AF. Volume status ok. Activity 3hrs/day  Personally reviewed   CPX (6/17) peak VO2 13.3  (predicted peak 71%), VE/VCO2 slope 41, RER 1.19 OUES 1.15 (pVO2 corrects to 19.2 ml/kg/min for iBW)  05/19/2012  EF 15% 2/15 EF 15%, diffuse hypokinesis, restrictive diastolic function, s/p MV repair with moderate-severe MR.  4/15: EF 15%, severe MR, mod TR 2/17 EF 20-25% 9/18 Echo EF 20-25% RV normal (Personally reviewed)  CPX: 06/17/12 Peak VO2 14.8 (predicted peak VO2 68.5%), VE/VCO2 slope 43.4 OUES: 1.19, Peak RER 1.12 Vent threshold 11.3 (pred peak VO2 52.3%)  CPX (3/15): peak VO2 15.6 (predicted peak VO2 74.5%) VE/VO2 40.8, RER 1.2 CPX (6/16): peak VO2: 13.3 (68.6% predicted peak VO2) - corrects to 18.6 ml/kg/min for iBW   -VE/VCO2 slope: 33.7, OUES: 1.20, Peak RER: 1.12, VE/MVV: 48.4%,O2pulse: 11 (100% predicted O2pulse)   LHC (10/2013) 1) severe native CAD with all bypass grafts patent  11/01/13 RHC RA = 5  RV = 47/3/10  PA = 54/16 (30)  PCW = 14 v = 20  Fick cardiac output/index = 4.8/2.6  Thermo CO/CI = 3.7/2.0  PVR = 4.3 WU  FA sat = 95%  PA sat = 59%, 59%   Labs:  08/12/12 - on lipitor 40 mg daily Cholesterol 180 Triglyceride 195 HDL 29 LDL 112 K 4.5, creatinine 1.4 11/16/12 K 4.0 Labs 1.5 Pro BNP 181 02/14/13: K+ 4.6, Cr 1.33, Dig level 1.8, TSH 2.97 3/15: K 4.2, creatinine 1.07, HCT 32.4 06/2013: Dig level 0.3, pro-BNP 899, Cholesterol 140, TG 106, HDL 36, LDL 82, Cr 1.5, K+ 4.9 11/11/13: K  4.1 Creatinine 1.8  01/31/14 Creatinine 1.48 K .5  02/07/14:  K 4.2 Creatinine 1.75 Magnesium 2.2 after sprionolactone 12.5 mg  was added  12/15: K 3.8, creatinine 1.32  04/04/2014: K 4.8 Creatinine 1.59 06/08/2014: K 3.3 Creatinine 1.47  04/2015: K 4.5 Creatinine 1.65 05/04/15: K 4.3 creatinine 1.45  FH: Mother deceased: CAD, DM2, HTN        Father deceased: stroke  SH: Works odds/end jobs for [employer]; disabled. Lives in [place] with 2 sons([name] and [name]).   ROS: All systems negative except as listed in HPI, PMH and Problem List.  Past Medical History:  Diagnosis Date    . AICD (automatic cardioverter/defibrillator) present   . Chronic systolic heart failure ([place])    a. ICM b. ECHO (06/2013): EF 15%, severe MR (06/2013) c. RHC (10/2013): RA 5, RV 23/2/5, PA 25/6 (14), PCWP 12, Fick CO/CI: 3.2/1.7, PVR 0.7 WU, PA 45%, 47% and 55% (with levophed 5), vagal response during cath. d. On home milrinone.  . CKD (chronic kidney disease), stage III   . Coronary artery disease    a. s/p CABG x 5 with MV annuloplasty 2007   . Diabetes mellitus   . Hypertension   . Implantable defibrillator   medtronic   . Ischemic cardiomyopathy    a. s/p ICD. b. LHC (10/04/13): 1. Severe native CAD with all bypass grafts patent. c. CRT upgrade 12/2013.  [name] LBBB (left bundle branch block)   . Lipoma   . PAD (peripheral artery disease) ([place])   . PICC line infection    a. 01/2014 - PICC exchanged.  . Polysubstance abuse    history of  (cocaine, tobacco and ETOH)  . V-tach [facility])     Current Outpatient Prescriptions  Medication Sig Dispense Refill  . ACCU-CHEK FASTCLIX LANCETS MISC Use to test blood glucose 2 times daily. Dx:E11.9 102 each 12  . acetaminophen (TYLENOL) 325 MG tablet Take 650 mg by mouth every 6 (six) hours as needed for mild pain.    [name] allopurinol (ZYLOPRIM) 100 MG tablet Take 2 tablets (200 mg total) by mouth at bedtime. 60 tablet 3  . amiodarone (PACERONE) 200 MG tablet Take 0.5 tablets (100 mg total) by mouth daily. 15 tablet 3  . amoxicillin (AMOXIL) 500 MG capsule Take 4 capsules (2,000 mg total) by mouth as needed (1 hour before dental work). 4 capsule 1  . aspirin EC 81 MG tablet Take 81 mg by mouth every morning.     [name] atorvastatin (LIPITOR) 80 MG tablet Take 1 tablet (80 mg total) by mouth daily. 90 tablet 3  . B-D ULTRAFINE III SHORT PEN 31G X 8 MM MISC USE TO INJECT INSULIN THREE TIMES DAILY 100 each 5  . Blood Glucose Monitoring Suppl (ACCU-CHEK NANO SMARTVIEW) W/DEVICE KIT Use to test blood glucose 2 times daily. Dx:E11.9 1 kit 0  . diclofenac sodium  (VOLTAREN) 1 % GEL Apply 4 g topically 4 (four) times daily. 1 Tube 2  . digoxin (LANOXIN) 0.125 MG tablet TAKE ONE-HALF TABLET BY MOUTH EVERY OTHER DAY 15 tablet 3  . furosemide (LASIX) 80 MG tablet Take 1 tablet (80 mg total) by mouth daily. 30 tablet 3  . glucose blood (ACCU-CHEK SMARTVIEW) test strip Use to test blood glucose 2 times daily. Dx:E11.9 100 each 12  . Insulin Glargine (LANTUS SOLOSTAR) 100 UNIT/ML Solostar Pen Inject 33 units into the skin at bedtime.Diagnosis code:E11.8 15 pen 3  . losartan (COZAAR) 25 MG tablet Take 0.5 tablets (  12.5 mg total) by mouth 2 (two) times daily. 60 tablet 4  . metolazone (ZAROXOLYN) 2.5 MG tablet Take 1 tablet (2.5 mg total) by mouth once a week. 90 tablet 3  . nitroGLYCERIN (NITROSTAT) 0.4 MG SL tablet Place 1 tablet (0.4 mg total) under the tongue every 5 (five) minutes as needed. For chest pain. 25 tablet 11  . potassium chloride SA (K-DUR,KLOR-CON) 20 MEQ tablet Take 20 mEq by mouth as needed (with metolazone).    . sitaGLIPtin (JANUVIA) 50 MG tablet Take 1 tablet (50 mg total) by mouth daily. 90 tablet 3  . spironolactone (ALDACTONE) 25 MG tablet Take 1 tablet (25 mg total) by mouth daily. 30 tablet 3   No current facility-administered medications for this encounter.     Vitals:   11/19/16 1453  BP: 109/63  Pulse: 69  SpO2: 98%  Weight: 199 lb (90.3 kg)    Wt Readings from Last 3 Encounters:  11/19/16 199 lb (90.3 kg)  11/12/16 200 lb 1.6 oz (90.8 kg)  10/03/16 199 lb 3.2 oz (90.4 kg)     PHYSICAL EXAM: General:  Well appearing. No resp difficulty HEENT: normal Neck: supple. no JVD. Carotids 2+ bilat; no bruits. No lymphadenopathy or thryomegaly appreciated. Cor: PMI  Laterally displaced. Regular rate & rhythm. 2/6 MR. No s3Lungs: clear Abdomen: soft, nontender, nondistended. No hepatosplenomegaly. No bruits or masses. Good bowel sounds. Extremities: no cyanosis, clubbing, rash, edema Neuro: alert & orientedx3, cranial nerves  grossly intact. moves all 4 extremities w/o difficulty. Affect pleasant   ASSESSMENT & PLAN:  1. Chronic systolic CHF: Ischemic cardiomyopathy s/p Medtronic CRT-D, EF 20-25% ( Echo 04/2015).  She follows in the transplant clinic at [facility] q 6 months. She is blood type O so matching for transplant may be difficult.  Would consider LVAD prior to transplant. She has been doing very well off milrinone, thought to possible be 2/2 CRT upgrade.   - Echo today reviewed personally EF 25-30% (stable) mild to moderate MR - Doing well. Stable NYHA II-III symptoms.  - Volume status stable on exam and Optivol. Continue lasix 80 daily. With an extra 80 mg and metolazone as needed for fluid up.   - Ideally would switch losartan to low dose Entesto but has had severe hypotension in recent past requring discontinuation of carvedilol and reduction in losartan - [facility] transplant notes reviewed personally. Agree with listing for heart-kidney. Will refer to Nephrology here as well (Dr. Hollie Salk) - Continue losartan 12.5 bid. BP stable and has failed up-titration several times.  - Intolerant to coreg with severe fatigue and hypotension. Tried multiple times.  - Reinforced fluid restriction to < 2 L daily, sodium restriction to less than 2000 mg daily, and the importance of daily weights.   - ICD interrogated personally in clinic 2. CAD:  - No s/s ischemia - Continue ASA/sttin 3. Mitral regurgitation:  -s/p mitral valve annuloplasty with CABG in 2007. Moderate to severe MR on echo in 4/15. TEE (06/2013) mitral regurgitation appeared severe, anatomy of repaired valve does not look suitable for MV clipping and would be high risk for MV replacement.  - mild to moderate MR on echo tday - No change to current plan. Check echo next visit 4. CKD stage III:  - BMET today 5.  NSVT:  - Continue amiodarone 100 mg.daily  - LFTs and TSH stable recently.  6. Obesity - Needs to keep weight down. If BMI > 35 will not be transplant  candidate.  7. PAD:  -  Symptoms stable - Right mid SFA stenosis per [place] 08/30/14.   8. Colon polyps - Had colonoscopy 06/08/14 with 12 polyps. Needs repeat. - Ok to proceed from cardiac standpoint.   Total time spent 45 minutes. Over half that time spent discussing above.    Robin Bickers, MD  11/19/16

## 2016-11-19 NOTE — Patient Instructions (Signed)
Will refer you to Dr. Wilford Corner with [facility]. Address: [address], [place], [place] [address] Phone: [phone]  Will refer you to [place] with Dr. Hollie Salk. Address: [address] [place], [place] [address] Phone: [phone]  Follow up 4 months with Dr. Haroldine Laws. We will call you closer to this time, or you may call our office to schedule 1 month before you are due to be seen. Take all medication as prescribed the day of your appointment. Bring all medications with you to your appointment.  Do the following things EVERYDAY: 1) Weigh yourself in the morning before breakfast. Write it down and keep it in a log. 2) Take your medicines as prescribed 3) Eat low salt foods-Limit salt (sodium) to 2000 mg per day.  4) Stay as active as you can everyday 5) Limit all fluids for the day to less than 2 liters

## 2016-12-05 ENCOUNTER — Other Ambulatory Visit: Payer: Self-pay | Admitting: Internal Medicine

## 2016-12-29 ENCOUNTER — Other Ambulatory Visit (HOSPITAL_COMMUNITY): Payer: Self-pay | Admitting: Internal Medicine

## 2016-12-29 DIAGNOSIS — I5022 Chronic systolic (congestive) heart failure: Secondary | ICD-10-CM

## 2017-01-12 ENCOUNTER — Ambulatory Visit (INDEPENDENT_AMBULATORY_CARE_PROVIDER_SITE_OTHER): Payer: Medicare Other | Admitting: *Deleted

## 2017-01-12 DIAGNOSIS — I428 Other cardiomyopathies: Secondary | ICD-10-CM

## 2017-01-12 NOTE — Progress Notes (Signed)
Remote ICD transmission.   

## 2017-01-13 LAB — CUP PACEART REMOTE DEVICE CHECK
Battery Remaining Longevity: 10 mo
Battery Voltage: 2.86 V
Brady Statistic AS VP Percent: 95.22 %
Brady Statistic RA Percent Paced: 2.68 %
Date Time Interrogation Session: 20181112083425
HIGH POWER IMPEDANCE MEASURED VALUE: 39 Ohm
HighPow Impedance: 44 Ohm
Implantable Lead Implant Date: 20151002
Implantable Lead Location: 753859
Implantable Lead Model: 4396
Implantable Pulse Generator Implant Date: 20151002
Lead Channel Impedance Value: 437 Ohm
Lead Channel Impedance Value: 551 Ohm
Lead Channel Pacing Threshold Amplitude: 0.75 V
Lead Channel Pacing Threshold Pulse Width: 1 ms
Lead Channel Sensing Intrinsic Amplitude: 17.75 mV
Lead Channel Setting Pacing Amplitude: 2.5 V
Lead Channel Setting Pacing Amplitude: 4.5 V
Lead Channel Setting Pacing Pulse Width: 0.4 ms
MDC IDC LEAD IMPLANT DT: 20111012
MDC IDC LEAD IMPLANT DT: 20151002
MDC IDC LEAD LOCATION: 753857
MDC IDC LEAD LOCATION: 753860
MDC IDC MSMT LEADCHNL LV IMPEDANCE VALUE: 456 Ohm
MDC IDC MSMT LEADCHNL LV IMPEDANCE VALUE: 494 Ohm
MDC IDC MSMT LEADCHNL LV IMPEDANCE VALUE: 817 Ohm
MDC IDC MSMT LEADCHNL LV PACING THRESHOLD AMPLITUDE: 3.75 V
MDC IDC MSMT LEADCHNL RA IMPEDANCE VALUE: 437 Ohm
MDC IDC MSMT LEADCHNL RA PACING THRESHOLD PULSEWIDTH: 0.4 ms
MDC IDC MSMT LEADCHNL RA SENSING INTR AMPL: 0.875 mV
MDC IDC MSMT LEADCHNL RA SENSING INTR AMPL: 0.875 mV
MDC IDC MSMT LEADCHNL RV PACING THRESHOLD AMPLITUDE: 1.25 V
MDC IDC MSMT LEADCHNL RV PACING THRESHOLD PULSEWIDTH: 0.4 ms
MDC IDC MSMT LEADCHNL RV SENSING INTR AMPL: 17.75 mV
MDC IDC SET LEADCHNL LV PACING PULSEWIDTH: 1 ms
MDC IDC SET LEADCHNL RA PACING AMPLITUDE: 2 V
MDC IDC SET LEADCHNL RV SENSING SENSITIVITY: 0.45 mV
MDC IDC STAT BRADY AP VP PERCENT: 2.64 %
MDC IDC STAT BRADY AP VS PERCENT: 0.06 %
MDC IDC STAT BRADY AS VS PERCENT: 2.09 %
MDC IDC STAT BRADY RV PERCENT PACED: 96.73 %

## 2017-01-16 ENCOUNTER — Encounter: Payer: Self-pay | Admitting: Cardiology

## 2017-01-21 ENCOUNTER — Other Ambulatory Visit (HOSPITAL_COMMUNITY): Payer: Self-pay | Admitting: Internal Medicine

## 2017-01-21 ENCOUNTER — Other Ambulatory Visit (HOSPITAL_COMMUNITY): Payer: Self-pay | Admitting: Student

## 2017-01-30 ENCOUNTER — Telehealth (HOSPITAL_COMMUNITY): Payer: Self-pay

## 2017-01-30 MED ORDER — AMIODARONE HCL 200 MG PO TABS: 100.0000 mg | ORAL_TABLET | Freq: Every day | ORAL | 3 refills | Status: DC

## 2017-01-30 NOTE — Telephone Encounter (Signed)
Advanced Heart Failure Triage Encounter  Patient Name: [name]  Date of Call: 01/30/17  Problem:  Pt called b/c she has been feeling SOB when talking and with exertion, however has not had any weight gain (losing weight even). Pt denies cp, cough, or any other change. Pt takes metolazone weekly and took it last night. Hadn't been taking med lately because weight had not been up. Pt says she felt a little better, but stil has sob. Pt denies any other med changes.   Plan:    Shirley Muscat, RN

## 2017-01-30 NOTE — Telephone Encounter (Signed)
Pt aware and agreeable.  

## 2017-01-30 NOTE — Telephone Encounter (Signed)
Left VM

## 2017-01-30 NOTE — Telephone Encounter (Signed)
No plan changes if just took metolazone. Just tell her to call back if doesn't not improve after.   Continue meds as directed.     Legrand Como [address]" [place], PA-C 01/30/2017 12:02 PM

## 2017-02-17 DIAGNOSIS — Z823 Family history of stroke: Secondary | ICD-10-CM | POA: Diagnosis not present

## 2017-02-17 DIAGNOSIS — Z86718 Personal history of other venous thrombosis and embolism: Secondary | ICD-10-CM | POA: Diagnosis not present

## 2017-02-17 DIAGNOSIS — I5022 Chronic systolic (congestive) heart failure: Secondary | ICD-10-CM | POA: Diagnosis not present

## 2017-02-17 DIAGNOSIS — Z452 Encounter for adjustment and management of vascular access device: Secondary | ICD-10-CM | POA: Diagnosis not present

## 2017-02-17 DIAGNOSIS — N189 Chronic kidney disease, unspecified: Secondary | ICD-10-CM | POA: Diagnosis not present

## 2017-02-17 DIAGNOSIS — F17210 Nicotine dependence, cigarettes, uncomplicated: Secondary | ICD-10-CM | POA: Diagnosis not present

## 2017-02-17 DIAGNOSIS — R5383 Other fatigue: Secondary | ICD-10-CM | POA: Diagnosis not present

## 2017-02-17 DIAGNOSIS — I255 Ischemic cardiomyopathy: Secondary | ICD-10-CM | POA: Diagnosis not present

## 2017-02-17 DIAGNOSIS — Z794 Long term (current) use of insulin: Secondary | ICD-10-CM | POA: Diagnosis not present

## 2017-02-17 DIAGNOSIS — M79606 Pain in leg, unspecified: Secondary | ICD-10-CM | POA: Diagnosis not present

## 2017-02-17 DIAGNOSIS — Z951 Presence of aortocoronary bypass graft: Secondary | ICD-10-CM | POA: Diagnosis not present

## 2017-02-17 DIAGNOSIS — I272 Pulmonary hypertension, unspecified: Secondary | ICD-10-CM | POA: Diagnosis not present

## 2017-02-17 DIAGNOSIS — I2510 Atherosclerotic heart disease of native coronary artery without angina pectoris: Secondary | ICD-10-CM | POA: Diagnosis not present

## 2017-02-17 DIAGNOSIS — R944 Abnormal results of kidney function studies: Secondary | ICD-10-CM | POA: Diagnosis not present

## 2017-02-17 DIAGNOSIS — I447 Left bundle-branch block, unspecified: Secondary | ICD-10-CM | POA: Diagnosis not present

## 2017-02-17 DIAGNOSIS — E1151 Type 2 diabetes mellitus with diabetic peripheral angiopathy without gangrene: Secondary | ICD-10-CM | POA: Diagnosis not present

## 2017-02-17 DIAGNOSIS — Z95810 Presence of automatic (implantable) cardiac defibrillator: Secondary | ICD-10-CM | POA: Diagnosis not present

## 2017-02-17 DIAGNOSIS — N183 Chronic kidney disease, stage 3 (moderate): Secondary | ICD-10-CM | POA: Diagnosis not present

## 2017-02-17 DIAGNOSIS — Z7982 Long term (current) use of aspirin: Secondary | ICD-10-CM | POA: Diagnosis not present

## 2017-02-17 DIAGNOSIS — E1122 Type 2 diabetes mellitus with diabetic chronic kidney disease: Secondary | ICD-10-CM | POA: Diagnosis not present

## 2017-02-17 DIAGNOSIS — I739 Peripheral vascular disease, unspecified: Secondary | ICD-10-CM | POA: Diagnosis not present

## 2017-02-17 DIAGNOSIS — I472 Ventricular tachycardia: Secondary | ICD-10-CM | POA: Diagnosis not present

## 2017-02-18 ENCOUNTER — Other Ambulatory Visit: Payer: Self-pay

## 2017-02-18 ENCOUNTER — Ambulatory Visit (INDEPENDENT_AMBULATORY_CARE_PROVIDER_SITE_OTHER): Payer: Medicare Other | Admitting: Internal Medicine

## 2017-02-18 ENCOUNTER — Encounter: Payer: Self-pay | Admitting: Internal Medicine

## 2017-02-18 VITALS — BP 119/58 | HR 87 | Temp 97.7°F | Ht 64.0 in | Wt 188.1 lb

## 2017-02-18 DIAGNOSIS — Z951 Presence of aortocoronary bypass graft: Secondary | ICD-10-CM

## 2017-02-18 DIAGNOSIS — I255 Ischemic cardiomyopathy: Secondary | ICD-10-CM

## 2017-02-18 DIAGNOSIS — E1122 Type 2 diabetes mellitus with diabetic chronic kidney disease: Secondary | ICD-10-CM | POA: Diagnosis not present

## 2017-02-18 DIAGNOSIS — N183 Chronic kidney disease, stage 3 (moderate): Secondary | ICD-10-CM

## 2017-02-18 DIAGNOSIS — Z7982 Long term (current) use of aspirin: Secondary | ICD-10-CM | POA: Diagnosis not present

## 2017-02-18 DIAGNOSIS — I2510 Atherosclerotic heart disease of native coronary artery without angina pectoris: Secondary | ICD-10-CM

## 2017-02-18 DIAGNOSIS — M1A9XX0 Chronic gout, unspecified, without tophus (tophi): Secondary | ICD-10-CM | POA: Diagnosis not present

## 2017-02-18 DIAGNOSIS — Z794 Long term (current) use of insulin: Secondary | ICD-10-CM

## 2017-02-18 DIAGNOSIS — I5022 Chronic systolic (congestive) heart failure: Secondary | ICD-10-CM

## 2017-02-18 DIAGNOSIS — E1151 Type 2 diabetes mellitus with diabetic peripheral angiopathy without gangrene: Secondary | ICD-10-CM | POA: Diagnosis not present

## 2017-02-18 DIAGNOSIS — I130 Hypertensive heart and chronic kidney disease with heart failure and stage 1 through stage 4 chronic kidney disease, or unspecified chronic kidney disease: Secondary | ICD-10-CM

## 2017-02-18 DIAGNOSIS — E118 Type 2 diabetes mellitus with unspecified complications: Secondary | ICD-10-CM

## 2017-02-18 DIAGNOSIS — R011 Cardiac murmur, unspecified: Secondary | ICD-10-CM

## 2017-02-18 DIAGNOSIS — Z95810 Presence of automatic (implantable) cardiac defibrillator: Secondary | ICD-10-CM

## 2017-02-18 DIAGNOSIS — M109 Gout, unspecified: Secondary | ICD-10-CM

## 2017-02-18 DIAGNOSIS — Z79899 Other long term (current) drug therapy: Secondary | ICD-10-CM

## 2017-02-18 DIAGNOSIS — Z87891 Personal history of nicotine dependence: Secondary | ICD-10-CM | POA: Diagnosis not present

## 2017-02-18 LAB — POCT GLYCOSYLATED HEMOGLOBIN (HGB A1C): HEMOGLOBIN A1C: 7.5

## 2017-02-18 LAB — GLUCOSE, CAPILLARY: GLUCOSE-CAPILLARY: 115 mg/dL — AB (ref 65–99)

## 2017-02-18 MED ORDER — FUROSEMIDE 80 MG PO TABS: 80.0000 mg | ORAL_TABLET | Freq: Two times a day (BID) | ORAL | 3 refills | Status: DC

## 2017-02-18 NOTE — Progress Notes (Signed)
CC: T2DM  HPI:  [name] is a 61 y.o. female with PMH as listed below including CAD s/p CABG x 5 with mitral annuloplasty in 2007, ischemic cardiomyopathy w/ CHF s/p CRT-D, T2DM, HTN, CKD 3, PAD, and Gout who presents for follow up management of her T2DM. Please see problem based charting for status of her chronic medical issues.  Diabetes mellitus type 2, controlled ([name]) Her last A1c was 7.2 in 11/2016. She reports adherence to Lantus 33 units qhs and Januvia 50 mg daily. She has avoided sugary drinks and tries to stay away from carb heavy foods. She likes to eat fruit. She admits to eating at [employer] recently. She checks her CBGs every morning and reports most readings between 120-130 with a range of 115 - 150. She denies any hyperglycemic or hypoglycemic symptoms. A/P: Repeat A1c this visit is 7.5. Her diabetes remains well-controlled. We discussed dietary modifications today and will plan to continue her current medications. - Continue Lantus 33 units qhs and Januvia 50 mg daily  Chronic systolic heart failure ([place]) She has recently followed up with the [facility] Heart Failure transplant team and her Lasix was increased to 80 mg twice daily (from once daily) due to her having dyspnea after walking for 3 minutes. She was instructed to discontinue her metolazone. She denies any orthopnea, PND, or leg edema. A/P: She appears euvolemic on my exam and her weight is actually down from prior visits. I have recommended she continue her current medications as prescribed by cardiology. She also reports possible plan for right heart catheterization next month. - Continue Lasix 80 mg BID - Continue Amiodarone 100 mg daily, Digoxin 0.0625 every other day, Losartan 12.5 mg BID, Spironolactone 25 mg daily, and Lipitor 80 mg daily - f/u with Heart Failure Clinic and [facility] Heart Transplant team as scheduled  Gout [name] has a reported history of gout which occurred in her great toe. No  diagnostic labs available in EPIC. She has been taking Allopurinol 200 mg daily for prophylaxis. She denies any recent gout attacks. Her last uric acid was 3.5 on 10/07/2013.  A/P: Her gout is well-controlled. We discussed rechecking her uric acid this visit and consider reducing her allopurinol dosing, however she would like to defer this to when she is having other blood draws with the transplant team. We will consider her current medications for now including Losartan which has uricosuric properties. - Continue Allopurinol 200 mg daily and Losartan     Past Medical History:  Diagnosis Date  . AICD (automatic cardioverter/defibrillator) present   . Chronic systolic heart failure ([place])    a. ICM b. ECHO (06/2013): EF 15%, severe MR (06/2013) c. RHC (10/2013): RA 5, RV 23/2/5, PA 25/6 (14), PCWP 12, Fick CO/CI: 3.2/1.7, PVR 0.7 WU, PA 45%, 47% and 55% (with levophed 5), vagal response during cath. d. On home milrinone.  . CKD (chronic kidney disease), stage III ([place])   . Coronary artery disease    a. s/p CABG x 5 with MV annuloplasty 2007   . Diabetes mellitus   . Hypertension   . Implantable defibrillator   medtronic   . Ischemic cardiomyopathy    a. s/p ICD. b. LHC (10/04/13): 1. Severe native CAD with all bypass grafts patent. c. CRT upgrade 12/2013.  [name] LBBB (left bundle branch block)   . Lipoma   . PAD (peripheral artery disease) ([place])   . PICC line infection    a. 01/2014 - PICC exchanged.  [name]  Polysubstance abuse ([place])    history of  (cocaine, tobacco and ETOH)  . V-tach [facility])    Review of Systems:   Review of Systems  Constitutional: Negative for chills and fever.  Respiratory:       Short of breath when walking 3 minutes  Cardiovascular: Negative for chest pain, palpitations and leg swelling.  Genitourinary: Negative for dysuria.  Musculoskeletal: Negative for falls.  Neurological: Negative for loss of consciousness.     Physical Exam:  Vitals:   02/18/17 1347  BP: (!)  119/58  Pulse: 87  Temp: 97.7 F (36.5 C)  TempSrc: Oral  SpO2: 100%  Weight: 188 lb 1.6 oz (85.3 kg)  Height: 5\' 4"  (1.626 m)   Physical Exam  Constitutional: She is oriented to person, place, and time. She appears well-developed and well-nourished. No distress.  HENT:  Head: Normocephalic and atraumatic.  Cardiovascular: Normal rate and regular rhythm.  Systolic murmur LUS border. ICD left chest wall.  Pulmonary/Chest: Effort normal. No respiratory distress. She has no wheezes. She has no rales.  Musculoskeletal: She exhibits no edema.  Neurological: She is alert and oriented to person, place, and time.  Skin: Skin is warm. She is not diaphoretic.  Healed sternotomy scar    Assessment & Plan:   See Encounters Tab for problem based charting.  Patient discussed with Dr. Evette Doffing.i

## 2017-02-18 NOTE — Patient Instructions (Addendum)
It was a pleasure to see you again [name].  Your Hemoglobin A1c is 7.5 today right around where we want it.  Please work on avoiding processed foods and starches.  Please continue to limit your daily salt and fluid intake.  Follow up with your heart doctors as scheduled.  I will send a referral for an eye doctor.  Please see [place] again in about 3 months or sooner if needed.

## 2017-02-19 NOTE — Assessment & Plan Note (Signed)
Her last A1c was 7.2 in 11/2016. She reports adherence to Lantus 33 units qhs and Januvia 50 mg daily. She has avoided sugary drinks and tries to stay away from carb heavy foods. She likes to eat fruit. She admits to eating at [employer] recently. She checks her CBGs every morning and reports most readings between 120-130 with a range of 115 - 150. She denies any hyperglycemic or hypoglycemic symptoms. A/P: Repeat A1c this visit is 7.5. Her diabetes remains well-controlled. We discussed dietary modifications today and will plan to continue her current medications. - Continue Lantus 33 units qhs and Januvia 50 mg daily

## 2017-02-19 NOTE — Assessment & Plan Note (Signed)
She has recently followed up with the [facility] Heart Failure transplant team and her Lasix was increased to 80 mg twice daily (from once daily) due to her having dyspnea after walking for 3 minutes. She was instructed to discontinue her metolazone. She denies any orthopnea, PND, or leg edema. A/P: She appears euvolemic on my exam and her weight is actually down from prior visits. I have recommended she continue her current medications as prescribed by cardiology. She also reports possible plan for right heart catheterization next month. - Continue Lasix 80 mg BID - Continue Amiodarone 100 mg daily, Digoxin 0.0625 every other day, Losartan 12.5 mg BID, Spironolactone 25 mg daily, and Lipitor 80 mg daily - f/u with Heart Failure Clinic and [facility] Heart Transplant team as scheduled

## 2017-02-19 NOTE — Assessment & Plan Note (Signed)
[name] has a reported history of gout which occurred in her great toe. No diagnostic labs available in EPIC. She has been taking Allopurinol 200 mg daily for prophylaxis. She denies any recent gout attacks. Her last uric acid was 3.5 on 10/07/2013.  A/P: Her gout is well-controlled. We discussed rechecking her uric acid this visit and consider reducing her allopurinol dosing, however she would like to defer this to when she is having other blood draws with the transplant team. We will consider her current medications for now including Losartan which has uricosuric properties. - Continue Allopurinol 200 mg daily and Losartan

## 2017-02-20 NOTE — Progress Notes (Signed)
Internal Medicine Clinic Attending  Case discussed with Dr. Patel at the time of the visit.  We reviewed the resident's history and exam and pertinent patient test results.  I agree with the assessment, diagnosis, and plan of care documented in the resident's note.  

## 2017-03-02 ENCOUNTER — Telehealth (HOSPITAL_COMMUNITY): Payer: Self-pay

## 2017-03-02 NOTE — Telephone Encounter (Signed)
Patient was seen at [facility] for yearly transplant eval. Reports she has been scheduled for L&R [facility] with Dr. Radene Knee for increased SOB and would like Dr. Haroldine Laws to be aware. Will forward to Dr. Haroldine Laws to make him aware per patient request.  Renee Pain, RN

## 2017-03-03 NOTE — Telephone Encounter (Signed)
We can perform that here for her. Please discuss with her and schedule with me and we'll get it done. Thanks.

## 2017-03-04 ENCOUNTER — Other Ambulatory Visit (HOSPITAL_COMMUNITY): Payer: Self-pay

## 2017-03-04 DIAGNOSIS — R0600 Dyspnea, unspecified: Secondary | ICD-10-CM

## 2017-03-04 NOTE — Telephone Encounter (Signed)
Will do! thanks

## 2017-03-04 NOTE — Telephone Encounter (Signed)
Patient scheduled for L&R [facility] with Dr Haroldine Laws on Tuesday 03/10/2017 at 9:00 am Instructions reviewed with patient, patient verbalized instructions back over the phone with understanding. Patient would like to make sure Dr. Haroldine Laws talks with Dr. Radene Knee about plan of care, will forward back to Dr. Haroldine Laws to make him aware.  Renee Pain, RN

## 2017-03-10 ENCOUNTER — Ambulatory Visit (HOSPITAL_COMMUNITY)
Admission: RE | Admit: 2017-03-10 | Discharge: 2017-03-10 | Disposition: A | Discharge status: Home / Self Care | Payer: Medicare Other | Source: Ambulatory Visit | Attending: Internal Medicine | Admitting: Internal Medicine

## 2017-03-10 ENCOUNTER — Encounter (HOSPITAL_COMMUNITY)
Admission: RE | Disposition: A | Discharge status: Home / Self Care | Payer: Self-pay | Source: Ambulatory Visit | Attending: Internal Medicine

## 2017-03-10 DIAGNOSIS — Z7682 Awaiting organ transplant status: Secondary | ICD-10-CM | POA: Diagnosis not present

## 2017-03-10 DIAGNOSIS — I255 Ischemic cardiomyopathy: Secondary | ICD-10-CM | POA: Insufficient documentation

## 2017-03-10 DIAGNOSIS — I2510 Atherosclerotic heart disease of native coronary artery without angina pectoris: Secondary | ICD-10-CM | POA: Insufficient documentation

## 2017-03-10 DIAGNOSIS — I5022 Chronic systolic (congestive) heart failure: Secondary | ICD-10-CM | POA: Diagnosis not present

## 2017-03-10 DIAGNOSIS — Z79899 Other long term (current) drug therapy: Secondary | ICD-10-CM | POA: Diagnosis not present

## 2017-03-10 DIAGNOSIS — E1151 Type 2 diabetes mellitus with diabetic peripheral angiopathy without gangrene: Secondary | ICD-10-CM | POA: Diagnosis not present

## 2017-03-10 DIAGNOSIS — Z823 Family history of stroke: Secondary | ICD-10-CM | POA: Diagnosis not present

## 2017-03-10 DIAGNOSIS — Z7982 Long term (current) use of aspirin: Secondary | ICD-10-CM | POA: Diagnosis not present

## 2017-03-10 DIAGNOSIS — I447 Left bundle-branch block, unspecified: Secondary | ICD-10-CM | POA: Insufficient documentation

## 2017-03-10 DIAGNOSIS — Z951 Presence of aortocoronary bypass graft: Secondary | ICD-10-CM | POA: Insufficient documentation

## 2017-03-10 DIAGNOSIS — Z95810 Presence of automatic (implantable) cardiac defibrillator: Secondary | ICD-10-CM | POA: Diagnosis not present

## 2017-03-10 DIAGNOSIS — I739 Peripheral vascular disease, unspecified: Secondary | ICD-10-CM | POA: Diagnosis not present

## 2017-03-10 DIAGNOSIS — F1410 Cocaine abuse, uncomplicated: Secondary | ICD-10-CM | POA: Insufficient documentation

## 2017-03-10 DIAGNOSIS — I472 Ventricular tachycardia: Secondary | ICD-10-CM | POA: Diagnosis not present

## 2017-03-10 DIAGNOSIS — E669 Obesity, unspecified: Secondary | ICD-10-CM | POA: Insufficient documentation

## 2017-03-10 DIAGNOSIS — E1122 Type 2 diabetes mellitus with diabetic chronic kidney disease: Secondary | ICD-10-CM | POA: Insufficient documentation

## 2017-03-10 DIAGNOSIS — Z8249 Family history of ischemic heart disease and other diseases of the circulatory system: Secondary | ICD-10-CM | POA: Insufficient documentation

## 2017-03-10 DIAGNOSIS — N183 Chronic kidney disease, stage 3 (moderate): Secondary | ICD-10-CM | POA: Diagnosis not present

## 2017-03-10 DIAGNOSIS — Z6830 Body mass index (BMI) 30.0-30.9, adult: Secondary | ICD-10-CM | POA: Diagnosis not present

## 2017-03-10 DIAGNOSIS — I340 Nonrheumatic mitral (valve) insufficiency: Secondary | ICD-10-CM | POA: Diagnosis not present

## 2017-03-10 DIAGNOSIS — I130 Hypertensive heart and chronic kidney disease with heart failure and stage 1 through stage 4 chronic kidney disease, or unspecified chronic kidney disease: Secondary | ICD-10-CM | POA: Diagnosis not present

## 2017-03-10 DIAGNOSIS — R0600 Dyspnea, unspecified: Secondary | ICD-10-CM

## 2017-03-10 DIAGNOSIS — I252 Old myocardial infarction: Secondary | ICD-10-CM | POA: Insufficient documentation

## 2017-03-10 HISTORY — PX: RIGHT/LEFT HEART CATH AND CORONARY/GRAFT ANGIOGRAPHY: CATH118267

## 2017-03-10 LAB — POCT I-STAT 3, ART BLOOD GAS (G3+)
Acid-base deficit: 2 mmol/L (ref 0.0–2.0)
Bicarbonate: 22.8 mmol/L (ref 20.0–28.0)
O2 Saturation: 96 %
PCO2 ART: 39.7 mmHg (ref 32.0–48.0)
PH ART: 7.368 (ref 7.350–7.450)
TCO2: 24 mmol/L (ref 22–32)
pO2, Arterial: 87 mmHg (ref 83.0–108.0)

## 2017-03-10 LAB — POCT I-STAT 3, VENOUS BLOOD GAS (G3P V)
ACID-BASE DEFICIT: 1 mmol/L (ref 0.0–2.0)
Acid-base deficit: 3 mmol/L — ABNORMAL HIGH (ref 0.0–2.0)
Bicarbonate: 23.9 mmol/L (ref 20.0–28.0)
Bicarbonate: 22.3 mmol/L (ref 20.0–28.0)
O2 Saturation: 66 %
O2 Saturation: 64 %
PCO2 VEN: 40.4 mmHg — AB (ref 44.0–60.0)
PH VEN: 7.35 (ref 7.250–7.430)
PH VEN: 7.369 (ref 7.250–7.430)
PO2 VEN: 35 mmHg (ref 32.0–45.0)
PO2 VEN: 35 mmHg (ref 32.0–45.0)
TCO2: 25 mmol/L (ref 22–32)
TCO2: 24 mmol/L (ref 22–32)
pCO2, Ven: 41.4 mmHg — ABNORMAL LOW (ref 44.0–60.0)

## 2017-03-10 LAB — BASIC METABOLIC PANEL
ANION GAP: 13 (ref 5–15)
BUN: 28 mg/dL — AB (ref 6–20)
CALCIUM: 9.7 mg/dL (ref 8.9–10.3)
CO2: 22 mmol/L (ref 22–32)
CREATININE: 1.27 mg/dL — AB (ref 0.44–1.00)
Chloride: 102 mmol/L (ref 101–111)
GFR calc Af Amer: 52 mL/min — ABNORMAL LOW (ref >60)
GFR, EST NON AFRICAN AMERICAN: 45 mL/min — AB (ref >60)
GLUCOSE: 125 mg/dL — AB (ref 65–99)
Potassium: 3.9 mmol/L (ref 3.5–5.1)
Sodium: 137 mmol/L (ref 135–145)

## 2017-03-10 LAB — CBC
HCT: 34.3 % — ABNORMAL LOW (ref 36.0–46.0)
Hemoglobin: 10.8 g/dL — ABNORMAL LOW (ref 12.0–15.0)
MCH: 27.8 pg (ref 26.0–34.0)
MCHC: 31.5 g/dL (ref 30.0–36.0)
MCV: 88.4 fL (ref 78.0–100.0)
PLATELETS: 153 10*3/uL (ref 150–400)
RBC: 3.88 MIL/uL (ref 3.87–5.11)
RDW: 15.4 % (ref 11.5–15.5)
WBC: 4.7 10*3/uL (ref 4.0–10.5)

## 2017-03-10 LAB — GLUCOSE, CAPILLARY
GLUCOSE-CAPILLARY: 95 mg/dL (ref 65–99)
Glucose-Capillary: 128 mg/dL — ABNORMAL HIGH (ref 65–99)

## 2017-03-10 LAB — PROTIME-INR
INR: 1.15
PROTHROMBIN TIME: 14.6 s (ref 11.4–15.2)

## 2017-03-10 SURGERY — RIGHT/LEFT HEART CATH AND CORONARY/GRAFT ANGIOGRAPHY
Anesthesia: LOCAL

## 2017-03-10 MED ORDER — SODIUM CHLORIDE 0.9% FLUSH
3.0000 mL | INTRAVENOUS | Status: DC | PRN
Start: 2017-03-10 — End: 2017-03-10

## 2017-03-10 MED ORDER — SODIUM CHLORIDE 0.9% FLUSH: 3.0000 mL | Freq: Two times a day (BID) | INTRAVENOUS | Status: DC

## 2017-03-10 MED ORDER — HEPARIN (PORCINE) IN NACL 2-0.9 UNIT/ML-% IJ SOLN
INTRAMUSCULAR | Status: AC | PRN
  Administered 2017-03-10: 1000 mL

## 2017-03-10 MED ORDER — ACETAMINOPHEN 325 MG PO TABS: 650.0000 mg | ORAL_TABLET | ORAL | Status: DC | PRN

## 2017-03-10 MED ORDER — SODIUM CHLORIDE 0.9% FLUSH: 3.0000 mL | INTRAVENOUS | Status: DC | PRN

## 2017-03-10 MED ORDER — LIDOCAINE HCL (PF) 1 % IJ SOLN
INTRAMUSCULAR | Status: AC
  Filled 2017-03-10: qty 30

## 2017-03-10 MED ORDER — IOPAMIDOL (ISOVUE-370) INJECTION 76%
INTRAVENOUS | Status: AC
  Filled 2017-03-10: qty 100

## 2017-03-10 MED ORDER — ASPIRIN 81 MG PO CHEW: 81.0000 mg | CHEWABLE_TABLET | ORAL | Status: DC

## 2017-03-10 MED ORDER — SODIUM CHLORIDE 0.9 % IV SOLN: INTRAVENOUS | Status: AC

## 2017-03-10 MED ORDER — ONDANSETRON HCL 4 MG/2ML IJ SOLN: 4.0000 mg | Freq: Four times a day (QID) | INTRAMUSCULAR | Status: DC | PRN

## 2017-03-10 MED ORDER — MIDAZOLAM HCL 2 MG/2ML IJ SOLN
INTRAMUSCULAR | Status: AC
  Filled 2017-03-10: qty 2

## 2017-03-10 MED ORDER — IOPAMIDOL (ISOVUE-370) INJECTION 76%
INTRAVENOUS | Status: DC | PRN
  Administered 2017-03-10: 45 mL via INTRA_ARTERIAL

## 2017-03-10 MED ORDER — IOPAMIDOL (ISOVUE-370) INJECTION 76%
INTRAVENOUS | Status: AC
  Filled 2017-03-10: qty 125

## 2017-03-10 MED ORDER — LIDOCAINE HCL (PF) 1 % IJ SOLN
INTRAMUSCULAR | Status: DC | PRN
  Administered 2017-03-10: 15 mL via INTRADERMAL

## 2017-03-10 MED ORDER — SODIUM CHLORIDE 0.9 % IV SOLN: INTRAVENOUS | Status: DC

## 2017-03-10 MED ORDER — FENTANYL CITRATE (PF) 100 MCG/2ML IJ SOLN
INTRAMUSCULAR | Status: DC | PRN
  Administered 2017-03-10: 25 ug via INTRAVENOUS

## 2017-03-10 MED ORDER — SODIUM CHLORIDE 0.9 % IV SOLN: 250.0000 mL | INTRAVENOUS | Status: DC | PRN

## 2017-03-10 MED ORDER — MIDAZOLAM HCL 2 MG/2ML IJ SOLN
INTRAMUSCULAR | Status: DC | PRN
  Administered 2017-03-10 (×2): 1 mg via INTRAVENOUS

## 2017-03-10 MED ORDER — FENTANYL CITRATE (PF) 100 MCG/2ML IJ SOLN
INTRAMUSCULAR | Status: AC
  Filled 2017-03-10: qty 2

## 2017-03-10 MED ORDER — HEPARIN (PORCINE) IN NACL 2-0.9 UNIT/ML-% IJ SOLN
INTRAMUSCULAR | Status: AC
  Filled 2017-03-10: qty 500

## 2017-03-10 SURGICAL SUPPLY — 9 items
CATH INFINITI 5 FR AR1 MOD (CATHETERS) ×2 IMPLANT
CATH INFINITI 5FR MULTPACK ANG (CATHETERS) ×2 IMPLANT
CATH SWAN GANZ 7F STRAIGHT (CATHETERS) ×2 IMPLANT
KIT HEART LEFT (KITS) ×2 IMPLANT
PACK CARDIAC CATHETERIZATION (CUSTOM PROCEDURE TRAY) ×2 IMPLANT
SHEATH PINNACLE 5F 10CM (SHEATH) ×2 IMPLANT
SHEATH PINNACLE 7F 10CM (SHEATH) ×2 IMPLANT
TRANSDUCER W/STOPCOCK (MISCELLANEOUS) ×2 IMPLANT
WIRE EMERALD 3MM-J .035X150CM (WIRE) ×2 IMPLANT

## 2017-03-10 NOTE — Interval H&P Note (Signed)
History and Physical Interval Note:  03/10/2017 8:03 AM  [name]  has presented today for surgery, with the diagnosis of chf  The various methods of treatment have been discussed with the patient and family. After consideration of risks, benefits and other options for treatment, the patient has consented to  Procedure(s): RIGHT/LEFT HEART CATH AND CORONARY/GRAFT ANGIOGRAPHY (N/A) and possible coronary angioplasty as a surgical intervention .  The patient's history has been reviewed, patient examined, no change in status, stable for surgery.  I have reviewed the patient's chart and labs.  Questions were answered to the patient's satisfaction.     [name]

## 2017-03-10 NOTE — Discharge Instructions (Signed)

## 2017-03-10 NOTE — Progress Notes (Signed)
Darrick Grinder, NP in to speak with pt further regarding shortness of breath and plan of care.

## 2017-03-10 NOTE — Progress Notes (Signed)
Site area:Right groin a 5 french arterial and 7 french venous sheath was removed  Site Prior to Removal:  Level 0  Pressure Applied For 20 MINUTES    Bedrest Beginning at 1320p  Manual:   Yes.    Patient Status During Pull:  stable  Post Pull Groin Site:  Level 0  Post Pull Instructions Given:  Yes.    Post Pull Pulses Present:  Yes.    Dressing Applied:  Yes.    Comments:  VS remain stable

## 2017-03-10 NOTE — H&P (Signed)
Patient ID: [name], female   DOB: 03/28/1955, 62 y.o.   MRN: [id number]    Advanced Heart Failure Clinic Note  PCP: Dr. Posey Pronto  Primary HF:  Dr. Haroldine Laws   HPI: 62 y.o. female with history of HTN, DM2, past polysubstance abuse (ETOH, tobacco, cocaine), CAD s/p CABG x5 with MV annuloplasty (2007), ischemic cardiomyopathy s/p Medtronic CRT-D, chronic systolic HF EF 93%, severe MR, CKD stage III and PAD. Blood type O+.  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 At time of cath LAD, LCX and RCA occluded. EF 45%. Had abrupt stent occlusion the next day and had to go back to the lab. 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   Amitted 8/2-10/07/13 for syncope s/p ICD shock. Concern that VT was possibly from ischemia and taken for [facility]. Started on Amiodarone.   On 11/25/13 underwent RHC for low output symptoms. Hemodynamics borderline. Swan left in and she was observed. Cardiac output dropped while in hospital so started on milrinone. Discharged home. Underwent CRT-D upgrade on 12/02/13  In 12/15, she was admitted with catheter infection. She was admitted and catheter was replaced.  She has completed antibiotic.    She has been titrated off milrinone. She has been off since January 2016.   Continues to follow with Dr. Mearl Latin at [facility]. She is listed as status 2 fo. She was told she would need heart-kidney transplant as kidneys likely not strong enough to handle heart transplant and immunosuppressive regimen. Doing well stable NYHA II-III symptoms. Weight stable. Here for R/L cath as part of transplant w/u.    CPX (6/17) peak VO2 13.3 (predicted peak 71%), VE/VCO2 slope 41, RER 1.19 OUES 1.15 (pVO2 corrects to 19.2 ml/kg/min for iBW)  05/19/2012  EF 15% 2/15 EF 15%, diffuse hypokinesis, restrictive diastolic function, s/p MV repair with moderate-severe MR.  4/15: EF 15%, severe MR, mod  TR 2/17 EF 20-25% 9/18 Echo EF 20-25% RV normal (Personally reviewed)  CPX: 06/17/12 Peak VO2 14.8 (predicted peak VO2 68.5%), VE/VCO2 slope 43.4 OUES: 1.19, Peak RER 1.12 Vent threshold 11.3 (pred peak VO2 52.3%)  CPX (3/15): peak VO2 15.6 (predicted peak VO2 74.5%) VE/VO2 40.8, RER 1.2 CPX (6/16): peak VO2: 13.3 (68.6% predicted peak VO2) - corrects to 18.6 ml/kg/min for iBW   -VE/VCO2 slope: 33.7, OUES: 1.20, Peak RER: 1.12, VE/MVV: 48.4%,O2pulse: 11 (100% predicted O2pulse)   LHC (10/2013) 1) severe native CAD with all bypass grafts patent  11/01/13 RHC RA = 5  RV = 47/3/10  PA = 54/16 (30)  PCW = 14 v = 20  Fick cardiac output/index = 4.8/2.6  Thermo CO/CI = 3.7/2.0  PVR = 4.3 WU  FA sat = 95%  PA sat = 59%, 59%   Labs:  08/12/12 - on lipitor 40 mg daily Cholesterol 180 Triglyceride 195 HDL 29 LDL 112 K 4.5, creatinine 1.4 11/16/12 K 4.0 Labs 1.5 Pro BNP 181 02/14/13: K+ 4.6, Cr 1.33, Dig level 1.8, TSH 2.97 3/15: K 4.2, creatinine 1.07, HCT 32.4 06/2013: Dig level 0.3, pro-BNP 899, Cholesterol 140, TG 106, HDL 36, LDL 82, Cr 1.5, K+ 4.9 11/11/13: K 4.1 Creatinine 1.8  01/31/14 Creatinine 1.48 K .5  02/07/14:  K 4.2 Creatinine 1.75 Magnesium 2.2 after sprionolactone 12.5 mg  was added  12/15: K 3.8, creatinine 1.32  04/04/2014: K 4.8 Creatinine 1.59 06/08/2014: K 3.3 Creatinine 1.47  04/2015:  K 4.5 Creatinine 1.65 05/04/15: K 4.3 creatinine 1.45  FH: Mother deceased: CAD, DM2, HTN        Father deceased: stroke  SH: Works odds/end jobs for [employer]; disabled. Lives in [place] with 2 sons([name] and [name]).   ROS: All systems negative except as listed in HPI, PMH and Problem List.  Past Medical History:  Diagnosis Date  . AICD (automatic cardioverter/defibrillator) present   . Chronic systolic heart failure ([place])    a. ICM b. ECHO (06/2013): EF 15%, severe MR (06/2013) c. RHC (10/2013): RA 5, RV 23/2/5, PA 25/6 (14), PCWP 12, Fick CO/CI: 3.2/1.7, PVR 0.7 WU, PA 45%, 47% and  55% (with levophed 5), vagal response during cath. d. On home milrinone.  . CKD (chronic kidney disease), stage III ([place])   . Coronary artery disease    a. s/p CABG x 5 with MV annuloplasty 2007   . Diabetes mellitus   . Hypertension   . Implantable defibrillator   medtronic   . Ischemic cardiomyopathy    a. s/p ICD. b. LHC (10/04/13): 1. Severe native CAD with all bypass grafts patent. c. CRT upgrade 12/2013.  [name] LBBB (left bundle branch block)   . Lipoma   . PAD (peripheral artery disease) ([place])   . PICC line infection    a. 01/2014 - PICC exchanged.  . Polysubstance abuse ([place])    history of  (cocaine, tobacco and ETOH)  . V-tach [facility])     Current Facility-Administered Medications  Medication Dose Route Frequency Provider Last Rate Last Dose  . 0.9 %  sodium chloride infusion  250 mL Intravenous PRN Trueman Worlds, Shaune Pascal, MD      . 0.9 %  sodium chloride infusion   Intravenous Continuous Juelz Claar, Shaune Pascal, MD      . aspirin chewable tablet 81 mg  81 mg Oral Pre-Cath Sarann Tregre, Shaune Pascal, MD      . sodium chloride flush (NS) 0.9 % injection 3 mL  3 mL Intravenous Q12H Ria Redcay, Shaune Pascal, MD      . sodium chloride flush (NS) 0.9 % injection 3 mL  3 mL Intravenous PRN Mykenna Viele, Shaune Pascal, MD        Vitals:   03/10/17 0704  BP: 121/63  Pulse: 78  Resp: 18  Temp: 98.1 F (36.7 C)  TempSrc: Oral  SpO2: 92%  Weight: 81.6 kg (180 lb)  Height: 5\' 4"  (1.626 m)    Wt Readings from Last 3 Encounters:  03/10/17 81.6 kg (180 lb)  02/18/17 85.3 kg (188 lb 1.6 oz)  11/19/16 90.3 kg (199 lb)     PHYSICAL EXAM: General:  Well appearing. No resp difficulty HEENT: normal Neck: supple. no JVD. Carotids 2+ bilat; no bruits. No lymphadenopathy or thryomegaly appreciated. Cor: PMI nondisplaced. Regular rate & rhythm. 2/6 MRLungs: clear Abdomen: obese soft, nontender, nondistended. No hepatosplenomegaly. No bruits or masses. Good bowel sounds. Extremities: no cyanosis, clubbing, rash,  edema Neuro: alert & orientedx3, cranial nerves grossly intact. moves all 4 extremities w/o difficulty. Affect pleasant   ASSESSMENT & PLAN:  1. Chronic systolic CHF: Ischemic cardiomyopathy s/p Medtronic CRT-D, EF 20-25% ( Echo 04/2015).  She follows in the transplant clinic at [facility] q 6 months. She is blood type O so matching for transplant may be difficult.  Would consider LVAD prior to transplant. She has been doing very well off milrinone, thought to possible be 2/2 CRT upgrade.   - Echo today reviewed personally EF 25-30% (stable) mild to  moderate MR - Doing well. Stable NYHA II-III symptoms.  - [facility] transplant notes reviewed personally. Agree with listing for heart-kidney.  - Here for R/L cath as part of transplant work-up. Procedure discussed  2. CAD:  - No s/s ischemia - Continue ASA/statin - Here for R/L cath as part of transplant work-up. Procedure discussed  3. Mitral regurgitation:  -s/p mitral valve annuloplasty with CABG in 2007. Moderate to severe MR on echo in 4/15. TEE (06/2013) mitral regurgitation appeared severe, anatomy of repaired valve does not look suitable for MV clipping and would be high risk for MV replacement.  - mild to moderate MR on last echo   4. CKD stage III:  - BMET today - limit contrast  5.  NSVT:  - Continue amiodarone 100 mg.daily  - LFTs and TSH stable recently.   6. Obesity - Needs to keep weight down. If BMI > 35 will not be transplant candidate.   Glori Bickers, MD  03/10/17

## 2017-03-11 ENCOUNTER — Encounter (HOSPITAL_COMMUNITY): Payer: Self-pay | Admitting: Internal Medicine

## 2017-03-20 ENCOUNTER — Encounter (HOSPITAL_COMMUNITY): Payer: Self-pay | Admitting: Internal Medicine

## 2017-03-20 ENCOUNTER — Ambulatory Visit (HOSPITAL_COMMUNITY)
Admission: RE | Admit: 2017-03-20 | Discharge: 2017-03-20 | Disposition: A | Discharge status: Home / Self Care | Payer: Medicare Other | Source: Ambulatory Visit | Attending: Internal Medicine | Admitting: Internal Medicine

## 2017-03-20 ENCOUNTER — Other Ambulatory Visit (HOSPITAL_COMMUNITY): Payer: Self-pay | Admitting: *Deleted

## 2017-03-20 VITALS — BP 110/66 | HR 77 | Wt 181.4 lb

## 2017-03-20 DIAGNOSIS — I130 Hypertensive heart and chronic kidney disease with heart failure and stage 1 through stage 4 chronic kidney disease, or unspecified chronic kidney disease: Secondary | ICD-10-CM | POA: Diagnosis not present

## 2017-03-20 DIAGNOSIS — I340 Nonrheumatic mitral (valve) insufficiency: Secondary | ICD-10-CM | POA: Insufficient documentation

## 2017-03-20 DIAGNOSIS — Z86010 Personal history of colonic polyps: Secondary | ICD-10-CM | POA: Insufficient documentation

## 2017-03-20 DIAGNOSIS — Z823 Family history of stroke: Secondary | ICD-10-CM | POA: Diagnosis not present

## 2017-03-20 DIAGNOSIS — Z833 Family history of diabetes mellitus: Secondary | ICD-10-CM | POA: Insufficient documentation

## 2017-03-20 DIAGNOSIS — I5022 Chronic systolic (congestive) heart failure: Secondary | ICD-10-CM | POA: Diagnosis not present

## 2017-03-20 DIAGNOSIS — Z95810 Presence of automatic (implantable) cardiac defibrillator: Secondary | ICD-10-CM | POA: Diagnosis not present

## 2017-03-20 DIAGNOSIS — Z951 Presence of aortocoronary bypass graft: Secondary | ICD-10-CM | POA: Insufficient documentation

## 2017-03-20 DIAGNOSIS — Z794 Long term (current) use of insulin: Secondary | ICD-10-CM | POA: Diagnosis not present

## 2017-03-20 DIAGNOSIS — I2510 Atherosclerotic heart disease of native coronary artery without angina pectoris: Secondary | ICD-10-CM | POA: Insufficient documentation

## 2017-03-20 DIAGNOSIS — Z7982 Long term (current) use of aspirin: Secondary | ICD-10-CM | POA: Diagnosis not present

## 2017-03-20 DIAGNOSIS — I472 Ventricular tachycardia: Secondary | ICD-10-CM | POA: Diagnosis not present

## 2017-03-20 DIAGNOSIS — Z6831 Body mass index (BMI) 31.0-31.9, adult: Secondary | ICD-10-CM | POA: Diagnosis not present

## 2017-03-20 DIAGNOSIS — Z8249 Family history of ischemic heart disease and other diseases of the circulatory system: Secondary | ICD-10-CM | POA: Diagnosis not present

## 2017-03-20 DIAGNOSIS — E669 Obesity, unspecified: Secondary | ICD-10-CM | POA: Diagnosis not present

## 2017-03-20 DIAGNOSIS — E1151 Type 2 diabetes mellitus with diabetic peripheral angiopathy without gangrene: Secondary | ICD-10-CM | POA: Insufficient documentation

## 2017-03-20 DIAGNOSIS — N183 Chronic kidney disease, stage 3 (moderate): Secondary | ICD-10-CM | POA: Insufficient documentation

## 2017-03-20 DIAGNOSIS — E1122 Type 2 diabetes mellitus with diabetic chronic kidney disease: Secondary | ICD-10-CM | POA: Diagnosis not present

## 2017-03-20 DIAGNOSIS — Z79899 Other long term (current) drug therapy: Secondary | ICD-10-CM | POA: Insufficient documentation

## 2017-03-20 DIAGNOSIS — I255 Ischemic cardiomyopathy: Secondary | ICD-10-CM | POA: Insufficient documentation

## 2017-03-20 LAB — BASIC METABOLIC PANEL
Anion gap: 11 (ref 5–15)
BUN: 25 mg/dL — ABNORMAL HIGH (ref 6–20)
CO2: 25 mmol/L (ref 22–32)
CREATININE: 1.22 mg/dL — AB (ref 0.44–1.00)
Calcium: 10 mg/dL (ref 8.9–10.3)
Chloride: 103 mmol/L (ref 101–111)
GFR calc non Af Amer: 48 mL/min — ABNORMAL LOW (ref >60)
GFR, EST AFRICAN AMERICAN: 55 mL/min — AB (ref >60)
Glucose, Bld: 103 mg/dL — ABNORMAL HIGH (ref 65–99)
Potassium: 3.9 mmol/L (ref 3.5–5.1)
Sodium: 139 mmol/L (ref 135–145)

## 2017-03-20 MED ORDER — METOLAZONE 2.5 MG PO TABS: 2.5000 mg | ORAL_TABLET | ORAL | 3 refills | Status: DC

## 2017-03-20 NOTE — Patient Instructions (Signed)
Labs today (will call for abnormal results, otherwise no news is good news)  START taking metolazone 2.5 mg (1 Tablet) Once Weekly.  Please take every Friday.  One of our VAD coordinators will be in touch with you regarding [facility] referral and Colonoscopy appointment.   Follow up in 6 weeks.

## 2017-03-20 NOTE — Progress Notes (Signed)
Patient ID: [name], female   DOB: 01-21-1956, 62 y.o.   MRN: [id number]    Advanced Heart Failure Clinic Note  PCP: Dr. Posey Pronto  Primary HF:  Dr. Haroldine Laws   HPI: 62 y.o. female with history of HTN, DM2, past polysubstance abuse (ETOH, tobacco, cocaine), CAD s/p CABG x5 with MV annuloplasty (2007), ischemic cardiomyopathy s/p Medtronic CRT-D, chronic systolic HF EF 17-51%, CKD stage III and PAD. Blood type O+.  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 At time of cath LAD, LCX and RCA occluded. EF 45%. Had PCI of LAD followed by abrupt stent occlusion. Had repeat PCI of LAD and then underwent CABG x 5 with mitral valve annuloplasty in 2007.   Amitted 8/2-10/07/13 for syncope s/p ICD shock. Concern that VT was possibly from ischemia and taken for [facility]. Started on Amiodarone. Cardiac output dropped while in hospital so started on milrinone. Discharged home. Underwent CRT-D upgrade on 12/02/13  In 12/15, she was admitted with catheter infection. She was admitted and catheter was replaced.  Milrinone titrated off in January 2016 and has done reasonably well.   Currently undergoing heart-kidney transplant eval with Dr. Radene Knee at [facility]. Had cath 03/10/17 as part of process.    Ao = 99/59 (75) LV = 104/22 RA = 11 RV = 57/13 PA = 61/28 (39) PCW = 24 (v = 35-40) Fick cardiac output/index = 5.5/2.9 Thermo CO/CI = 6.9/3.7 PVR = 2.2 WU FA sat = 96% PA sat = 65%, 64%  Assessment: 1. Severe 3v CAD 2. All 5 grafts open 3. Moderately elevated biventricular pressures with normal cardiac output. Prominent v-waves in PCW tracing c/w with significant MR  She presents today for post-cath follow up. Overall feels good but reports increased SOB after metolazone stopped and lasix increased to 80 bid (was on 40 bid) last month at [facility]. . Still working watching elderly people. Remains SOB with going up steps or walking a while. Weight down. CBGs improved. No  edema, orthopnea or PND. BP ok. No CP.   ICD interrogation done personally today: No VT/VF. No AF. Volume status markedly elevated since early December . Activity 3hrs/day    Echos: 05/19/2012  EF 15% 2/15 EF 15%, s/p MV repair with moderate-severe MR.  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 Lives in [place] with 2 sons([name] and [name]).   ROS: All systems negative except as listed in HPI, PMH and Problem List.  Past Medical History:  Diagnosis Date  . AICD (automatic cardioverter/defibrillator) present   . Chronic systolic heart failure ([place])    a. ICM b. ECHO (06/2013): EF 15%, severe MR (06/2013) c. RHC (10/2013): RA 5, RV 23/2/5, PA 25/6 (14), PCWP 12, Fick CO/CI: 3.2/1.7, PVR 0.7 WU, PA 45%, 47% and 55% (with levophed 5), vagal response during cath. d. On home milrinone.  . CKD (chronic kidney disease), stage III ([place])   . Coronary artery disease  a. s/p CABG x 5 with MV annuloplasty 2007   . Diabetes mellitus   . Hypertension   . Implantable defibrillator   medtronic   . Ischemic cardiomyopathy    a. s/p ICD. b. LHC (10/04/13): 1. Severe native CAD with all bypass grafts patent. c. CRT upgrade 12/2013.  [name] LBBB (left bundle branch block)   . Lipoma   . PAD (peripheral artery disease) ([place])   . PICC line infection    a. 01/2014 - PICC exchanged.  . Polysubstance abuse ([place])    history of  (cocaine, tobacco and ETOH)  . V-tach [facility])     Current Outpatient Medications  Medication Sig Dispense Refill  . ACCU-CHEK FASTCLIX  LANCETS MISC Use to test blood glucose 2 times daily. Dx:E11.9 102 each 12  . acetaminophen (TYLENOL) 500 MG tablet Take 500-1,000 mg by mouth every 6 (six) hours as needed (for pain.).    [name] allopurinol (ZYLOPRIM) 100 MG tablet TAKE 2 TABLETS BY MOUTH ONCE DAILY AT BEDTIME 180 tablet 0  . amiodarone (PACERONE) 200 MG tablet Take 0.5 tablets (100 mg total) by mouth daily. 15 tablet 3  . amoxicillin (AMOXIL) 500 MG capsule Take 4 capsules (2,000 mg total) by mouth as needed (1 hour before dental work). 4 capsule 1  . aspirin EC 81 MG tablet Take 81 mg by mouth every morning.     [name] atorvastatin (LIPITOR) 80 MG tablet TAKE 1 TABLET BY MOUTH ONCE DAILY 90 tablet 3  . B-D ULTRAFINE III SHORT PEN 31G X 8 MM MISC USE TO INJECT INSULIN THREE TIMES DAILY 100 each 5  . Blood Glucose Monitoring Suppl (ACCU-CHEK NANO SMARTVIEW) W/DEVICE KIT Use to test blood glucose 2 times daily. Dx:E11.9 1 kit 0  . diclofenac sodium (VOLTAREN) 1 % GEL Apply 4 g topically 4 (four) times daily. (Patient taking differently: Apply 4 g topically 4 (four) times daily as needed (for pain.). ) 1 Tube 2  . digoxin (LANOXIN) 0.125 MG tablet TAKE ONE-HALF TABLET BY MOUTH EVERY OTHER DAY (Patient taking differently: TAKE ONE-HALF (0.0625 mg)  TABLET BY MOUTH EVERY OTHER DAY) 15 tablet 3  . furosemide (LASIX) 80 MG tablet Take 1 tablet (80 mg total) by mouth 2 (two) times daily. 30 tablet 3  . glucose blood (ACCU-CHEK SMARTVIEW) test strip Use to test blood glucose 2 times daily. Dx:E11.9 100 each 12  . Insulin Glargine (LANTUS SOLOSTAR) 100 UNIT/ML Solostar Pen Inject 33 units into the skin at bedtime.Diagnosis code:E11.8 (Patient taking differently: Inject 33 Units into the skin at bedtime. Inject 33 units into the skin at bedtime.Diagnosis code:E11.8) 15 pen 3  . losartan (COZAAR) 25 MG tablet TAKE ONE-HALF TABLET BY MOUTH TWICE DAILY 60 tablet 4  . nitroGLYCERIN (NITROSTAT) 0.4 MG SL tablet Place 1 tablet (0.4 mg total) under the tongue  every 5 (five) minutes as needed. For chest pain. 25 tablet 11  . potassium chloride SA (K-DUR,KLOR-CON) 20 MEQ tablet Take 20 mEq by mouth daily.     . sitaGLIPtin (JANUVIA) 50 MG tablet Take 1 tablet (50 mg total) by mouth daily. (Patient taking differently: Take 50 mg by mouth every evening. ) 90 tablet 3  . spironolactone (ALDACTONE) 25 MG tablet Take 1 tablet (25 mg total) by mouth daily. 30 tablet 3   No current facility-administered medications for this encounter.     Vitals:   03/20/17 1021  BP: 110/66  Pulse: 77  SpO2: 96%  Weight: 181 lb 6.4 oz (82.3 kg)  Wt Readings from Last 3 Encounters:  03/20/17 181 lb 6.4 oz (82.3 kg)  03/10/17 180 lb (81.6 kg)  02/18/17 188 lb 1.6 oz (85.3 kg)     PHYSICAL EXAM: General:  Well appearing. No resp difficulty HEENT: normal Neck: supple. JVP 7. Carotids 2+ bilat; no bruits. No lymphadenopathy or thryomegaly appreciated. Cor: PMI nondisplaced. Regular rate & rhythm. No rubs, gallops or murmurs  2/6 SEM at RUSB. Soft MR Lungs: clear Abdomen: soft, nontender, nondistended. No hepatosplenomegaly. No bruits or masses. Good bowel sounds. Extremities: no cyanosis, clubbing, rash, trace edema Neuro: alert & orientedx3, cranial nerves grossly intact. moves all 4 extremities w/o difficulty. Affect pleasant   ASSESSMENT & PLAN:  1. Chronic systolic CHF: Ischemic cardiomyopathy s/p Medtronic CRT-D, EF 20-25% ( Echo 04/2015).  - She follows in the transplant clinic at [facility] q 6 months with Dr. Radene Knee. Being worked up for heart-kidney transplant - She is blood type O - Echo 9/18 EF 25% RV ok  - NYHA II-III with worsening symptoms after diuretic change last month -  Filling pressures up on cath and optivol. - Will resume metolazone 2.'5mg'$  once a week carefully. Watch renal function closely. Call me if dizzy  - Ideally would switch losartan to low dose Entesto but has had severe hypotension in recent past requring discontinuation of carvedilol and  reduction in losartan.  - Intolerant to coreg with severe fatigue and hypotension. Tried multiple times.  - [facility] transplant notes reviewed personally. Agree with listing for heart-kidney. We have referred to [facility] as well. Will also get local colonscopy. - Going for repeat transplant eval at [facility] next week. (2-day visit). If decompensates prior to transplant would be VAD candidate - Reinforced fluid restriction to < 2 L daily, sodium restriction to less than 2000 mg daily, and the importance of daily weights.   - ICD interrogated personally in clinic  2. CAD:  - Stable on recent cath 1/19 - No s/s ischemia. Continue ASA/statin  3. Mitral regurgitation:  -s/p mitral valve annuloplasty with CABG in 2007. Moderate to severe MR on echo in 4/15. TEE (06/2013) mitral regurgitation appeared severe, anatomy of repaired valve does not look suitable for MV clipping and would be high risk for MV replacement.  - mild to moderate MR on recent echo 9/18  4. CKD stage III:  - BMET today - Watch renal function carefull with change in diuretics - Refer [facility]  5.  NSVT:  - Continue amiodarone 100 mg.daily  - LFTs and TSH stable recently.  - No VT on ICD interrogation today in clinic (done personally)  6. Obesity - Much improved. Body mass index is 31.14 kg/m.   7. PAD:  - Symptoms stable - Right mid SFA stenosis per [place] 08/30/14.    8. Colon polyps - Had colonoscopy 06/08/14 with 12 polyps. Needs repeat. Will refer.  - Ok to proceed from cardiac standpoint.   Total time spent 45 minutes. Over half that time spent discussing above.    Glori Bickers, MD  03/20/17

## 2017-03-20 NOTE — Progress Notes (Signed)
Called patient to inform her that she has an appointment with dr Michail Sermon on 03/27/17 at 245pm. Waiting for a call a call back from [facility] to schedule patient with them.  Robin Quails RN, VAD Coordinator 24/7 pager [phone]

## 2017-03-23 ENCOUNTER — Encounter (HOSPITAL_COMMUNITY): Payer: Self-pay | Admitting: *Deleted

## 2017-03-23 NOTE — Progress Notes (Signed)
Referral packet faxed to [facility].  Balinda Quails RN, VAD Coordinator 24/7 pager [phone]

## 2017-03-26 ENCOUNTER — Other Ambulatory Visit (HOSPITAL_COMMUNITY): Payer: Self-pay | Admitting: Internal Medicine

## 2017-03-26 DIAGNOSIS — Z01818 Encounter for other preprocedural examination: Secondary | ICD-10-CM | POA: Diagnosis not present

## 2017-03-26 DIAGNOSIS — K824 Cholesterolosis of gallbladder: Secondary | ICD-10-CM | POA: Diagnosis not present

## 2017-03-26 DIAGNOSIS — Z008 Encounter for other general examination: Secondary | ICD-10-CM | POA: Diagnosis not present

## 2017-03-26 DIAGNOSIS — I2510 Atherosclerotic heart disease of native coronary artery without angina pectoris: Secondary | ICD-10-CM | POA: Diagnosis not present

## 2017-03-26 DIAGNOSIS — I517 Cardiomegaly: Secondary | ICD-10-CM | POA: Diagnosis not present

## 2017-03-26 DIAGNOSIS — I5022 Chronic systolic (congestive) heart failure: Secondary | ICD-10-CM | POA: Diagnosis not present

## 2017-03-26 DIAGNOSIS — K8020 Calculus of gallbladder without cholecystitis without obstruction: Secondary | ICD-10-CM | POA: Diagnosis not present

## 2017-03-31 ENCOUNTER — Encounter: Payer: Self-pay | Admitting: Internal Medicine

## 2017-03-31 NOTE — Addendum Note (Signed)
Addended by: Hulan Fray on: 03/31/2017 07:01 PM   Modules accepted: Orders

## 2017-04-06 ENCOUNTER — Other Ambulatory Visit: Payer: Self-pay | Admitting: Internal Medicine

## 2017-04-06 DIAGNOSIS — Z86010 Personal history of colonic polyps: Secondary | ICD-10-CM | POA: Diagnosis not present

## 2017-04-13 ENCOUNTER — Ambulatory Visit (INDEPENDENT_AMBULATORY_CARE_PROVIDER_SITE_OTHER): Payer: Medicare Other | Admitting: *Deleted

## 2017-04-13 ENCOUNTER — Ambulatory Visit: Payer: Self-pay | Admitting: Internal Medicine

## 2017-04-13 DIAGNOSIS — I428 Other cardiomyopathies: Secondary | ICD-10-CM | POA: Diagnosis not present

## 2017-04-13 NOTE — Progress Notes (Signed)
Remote ICD transmission.   

## 2017-04-15 ENCOUNTER — Encounter: Payer: Self-pay | Admitting: Cardiology

## 2017-04-20 ENCOUNTER — Other Ambulatory Visit (HOSPITAL_COMMUNITY): Payer: Self-pay | Admitting: *Deleted

## 2017-04-20 MED ORDER — POTASSIUM CHLORIDE CRYS ER 20 MEQ PO TBCR: 20.0000 meq | EXTENDED_RELEASE_TABLET | Freq: Every day | ORAL | 3 refills | Status: DC

## 2017-04-21 LAB — CUP PACEART REMOTE DEVICE CHECK
Battery Remaining Longevity: 7 mo
Brady Statistic AP VP Percent: 0.19 %
Brady Statistic AS VS Percent: 2.2 %
Brady Statistic RA Percent Paced: 0.2 %
Brady Statistic RV Percent Paced: 96.75 %
HighPow Impedance: 39 Ohm
HighPow Impedance: 47 Ohm
Implantable Lead Implant Date: 20111012
Implantable Lead Implant Date: 20151002
Implantable Lead Location: 753857
Implantable Lead Location: 753859
Implantable Lead Location: 753860
Implantable Lead Model: 4396
Implantable Lead Model: 5076
Implantable Pulse Generator Implant Date: 20151002
Lead Channel Impedance Value: 437 Ohm
Lead Channel Impedance Value: 494 Ohm
Lead Channel Impedance Value: 551 Ohm
Lead Channel Impedance Value: 893 Ohm
Lead Channel Pacing Threshold Amplitude: 0.875 V
Lead Channel Pacing Threshold Amplitude: 1 V
Lead Channel Pacing Threshold Amplitude: 3.25 V
Lead Channel Sensing Intrinsic Amplitude: 1 mV
Lead Channel Sensing Intrinsic Amplitude: 1 mV
Lead Channel Sensing Intrinsic Amplitude: 15.875 mV
Lead Channel Setting Pacing Amplitude: 2.5 V
Lead Channel Setting Pacing Pulse Width: 0.4 ms
Lead Channel Setting Sensing Sensitivity: 0.45 mV
MDC IDC LEAD IMPLANT DT: 20151002
MDC IDC MSMT BATTERY VOLTAGE: 2.84 V
MDC IDC MSMT LEADCHNL LV IMPEDANCE VALUE: 551 Ohm
MDC IDC MSMT LEADCHNL LV PACING THRESHOLD PULSEWIDTH: 1 ms
MDC IDC MSMT LEADCHNL RA IMPEDANCE VALUE: 437 Ohm
MDC IDC MSMT LEADCHNL RA PACING THRESHOLD PULSEWIDTH: 0.4 ms
MDC IDC MSMT LEADCHNL RV PACING THRESHOLD PULSEWIDTH: 0.4 ms
MDC IDC MSMT LEADCHNL RV SENSING INTR AMPL: 15.875 mV
MDC IDC SESS DTM: 20190211072824
MDC IDC SET LEADCHNL LV PACING AMPLITUDE: 4.5 V
MDC IDC SET LEADCHNL LV PACING PULSEWIDTH: 1 ms
MDC IDC SET LEADCHNL RA PACING AMPLITUDE: 2 V
MDC IDC STAT BRADY AP VS PERCENT: 0.02 %
MDC IDC STAT BRADY AS VP PERCENT: 97.6 %

## 2017-04-27 ENCOUNTER — Other Ambulatory Visit (HOSPITAL_COMMUNITY): Payer: Self-pay | Admitting: Internal Medicine

## 2017-05-01 ENCOUNTER — Ambulatory Visit (HOSPITAL_COMMUNITY)
Admission: RE | Admit: 2017-05-01 | Discharge: 2017-05-01 | Disposition: A | Discharge status: Home / Self Care | Payer: Medicare Other | Source: Ambulatory Visit | Attending: Internal Medicine | Admitting: Internal Medicine

## 2017-05-01 ENCOUNTER — Other Ambulatory Visit (HOSPITAL_COMMUNITY): Payer: Self-pay | Admitting: *Deleted

## 2017-05-01 ENCOUNTER — Encounter (HOSPITAL_COMMUNITY): Payer: Self-pay | Admitting: Internal Medicine

## 2017-05-01 VITALS — BP 116/68 | HR 77 | Wt 178.0 lb

## 2017-05-01 DIAGNOSIS — I472 Ventricular tachycardia: Secondary | ICD-10-CM | POA: Diagnosis not present

## 2017-05-01 DIAGNOSIS — I447 Left bundle-branch block, unspecified: Secondary | ICD-10-CM | POA: Insufficient documentation

## 2017-05-01 DIAGNOSIS — Z79899 Other long term (current) drug therapy: Secondary | ICD-10-CM | POA: Insufficient documentation

## 2017-05-01 DIAGNOSIS — E1151 Type 2 diabetes mellitus with diabetic peripheral angiopathy without gangrene: Secondary | ICD-10-CM | POA: Diagnosis not present

## 2017-05-01 DIAGNOSIS — F1410 Cocaine abuse, uncomplicated: Secondary | ICD-10-CM | POA: Insufficient documentation

## 2017-05-01 DIAGNOSIS — Z794 Long term (current) use of insulin: Secondary | ICD-10-CM | POA: Diagnosis not present

## 2017-05-01 DIAGNOSIS — I340 Nonrheumatic mitral (valve) insufficiency: Secondary | ICD-10-CM | POA: Insufficient documentation

## 2017-05-01 DIAGNOSIS — Z6830 Body mass index (BMI) 30.0-30.9, adult: Secondary | ICD-10-CM | POA: Insufficient documentation

## 2017-05-01 DIAGNOSIS — Z951 Presence of aortocoronary bypass graft: Secondary | ICD-10-CM | POA: Diagnosis not present

## 2017-05-01 DIAGNOSIS — F1010 Alcohol abuse, uncomplicated: Secondary | ICD-10-CM | POA: Insufficient documentation

## 2017-05-01 DIAGNOSIS — I5022 Chronic systolic (congestive) heart failure: Secondary | ICD-10-CM | POA: Insufficient documentation

## 2017-05-01 DIAGNOSIS — Z95810 Presence of automatic (implantable) cardiac defibrillator: Secondary | ICD-10-CM | POA: Insufficient documentation

## 2017-05-01 DIAGNOSIS — I255 Ischemic cardiomyopathy: Secondary | ICD-10-CM | POA: Diagnosis not present

## 2017-05-01 DIAGNOSIS — I739 Peripheral vascular disease, unspecified: Secondary | ICD-10-CM | POA: Diagnosis not present

## 2017-05-01 DIAGNOSIS — Z8249 Family history of ischemic heart disease and other diseases of the circulatory system: Secondary | ICD-10-CM | POA: Diagnosis not present

## 2017-05-01 DIAGNOSIS — Z7982 Long term (current) use of aspirin: Secondary | ICD-10-CM | POA: Diagnosis not present

## 2017-05-01 DIAGNOSIS — I130 Hypertensive heart and chronic kidney disease with heart failure and stage 1 through stage 4 chronic kidney disease, or unspecified chronic kidney disease: Secondary | ICD-10-CM | POA: Insufficient documentation

## 2017-05-01 DIAGNOSIS — E1122 Type 2 diabetes mellitus with diabetic chronic kidney disease: Secondary | ICD-10-CM | POA: Insufficient documentation

## 2017-05-01 DIAGNOSIS — I252 Old myocardial infarction: Secondary | ICD-10-CM | POA: Diagnosis not present

## 2017-05-01 DIAGNOSIS — N183 Chronic kidney disease, stage 3 (moderate): Secondary | ICD-10-CM | POA: Diagnosis not present

## 2017-05-01 DIAGNOSIS — I2510 Atherosclerotic heart disease of native coronary artery without angina pectoris: Secondary | ICD-10-CM | POA: Diagnosis not present

## 2017-05-01 DIAGNOSIS — E669 Obesity, unspecified: Secondary | ICD-10-CM | POA: Insufficient documentation

## 2017-05-01 DIAGNOSIS — I10 Essential (primary) hypertension: Secondary | ICD-10-CM

## 2017-05-01 LAB — BASIC METABOLIC PANEL
Anion gap: 10 (ref 5–15)
BUN: 38 mg/dL — AB (ref 6–20)
CALCIUM: 9.6 mg/dL (ref 8.9–10.3)
CO2: 27 mmol/L (ref 22–32)
CREATININE: 1.48 mg/dL — AB (ref 0.44–1.00)
Chloride: 101 mmol/L (ref 101–111)
GFR calc non Af Amer: 38 mL/min — ABNORMAL LOW (ref >60)
GFR, EST AFRICAN AMERICAN: 44 mL/min — AB (ref >60)
Glucose, Bld: 138 mg/dL — ABNORMAL HIGH (ref 65–99)
Potassium: 4.8 mmol/L (ref 3.5–5.1)
SODIUM: 138 mmol/L (ref 135–145)

## 2017-05-01 MED ORDER — ALLOPURINOL 100 MG PO TABS: ORAL_TABLET | ORAL | 3 refills | Status: DC

## 2017-05-01 NOTE — Progress Notes (Signed)
Patient ID: [name], female   DOB: 09-Aug-1955, 62 y.o.   MRN: [id number]    Advanced Heart Failure Clinic Note  PCP: Dr. Posey Fields  Primary HF:  Dr. Haroldine Fields   HPI: 62 y.o. female with history of HTN, DM2, past polysubstance abuse (ETOH, tobacco, cocaine), CAD s/p CABG x5 with MV annuloplasty (2007), ischemic cardiomyopathy s/p Medtronic CRT-D, chronic systolic HF EF 93-79%, CKD stage III and PAD. Blood type O+.  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 At time of cath LAD, LCX and RCA occluded. EF 45%. Had PCI of LAD followed by abrupt stent occlusion. Had repeat PCI of LAD and then underwent CABG x 5 with mitral valve annuloplasty in 2007.   Amitted 8/2-10/07/13 for syncope s/p ICD shock. Concern that VT was possibly from ischemia and taken for [facility]. Started on Amiodarone. Cardiac output dropped while in hospital so started on milrinone. Discharged home. Underwent CRT-D upgrade on 12/02/13  In 12/15, she was admitted with catheter infection. She was admitted and catheter was replaced.  Milrinone titrated off in January 2016 and has done reasonably well.   Currently undergoing heart-kidney transplant eval with Dr. Radene Fields at [facility]. Had cath 03/10/17 as part of process.    Ao = 99/59 (75) LV = 104/22 RA = 11 RV = 57/13 PA = 61/28 (39) PCW = 24 (v = 35-40) Fick cardiac output/index = 5.5/2.9 Thermo CO/CI = 6.9/3.7 PVR = 2.2 WU FA sat = 96% PA sat = 65%, 64%  Assessment: 1. Severe 3v CAD 2. All 5 grafts open 3. Moderately elevated biventricular pressures with normal cardiac output. Prominent v-waves in PCW tracing c/w with significant MR  She presents today for follow up. At last visit we restarted metolazone once a week. She feel much better. Now back to baseline. Can do all ADLs without any problem. Still working watching elderly people. Goes to store and walks the dog without problem. Mild SOB if going up steps. Weight down 3 pounds.  No dizziness.  No CP, edema, orthopnea or PND.    ICD interrogation done personally today:  No VT/VF/AF. Volume status much improved. Activity level back up to 4h/day  Echos: 05/19/2012  EF 15% 2/15 EF 15%, s/p MV repair with moderate-severe MR.  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 Lives in [place] with 2 sons([name] and [name]).   ROS: All systems negative except as listed in HPI, PMH and Problem List.  Past Medical History:  Diagnosis Date  . AICD (automatic cardioverter/defibrillator) present   . Chronic systolic heart failure ([place])    a. ICM b. ECHO (06/2013): EF 15%, severe MR (06/2013) c. RHC (10/2013): RA 5, RV 23/2/5, PA 25/6 (14), PCWP 12, Fick CO/CI: 3.2/1.7, PVR 0.7 WU, PA 45%, 47% and 55% (with levophed 5), vagal response during cath. d. On home milrinone.  . CKD (chronic kidney disease), stage III ([place])   . Coronary artery disease  a. s/p CABG x 5 with MV annuloplasty 2007   . Diabetes mellitus   . Hypertension   . Implantable defibrillator   medtronic   . Ischemic cardiomyopathy    a. s/p ICD. b. LHC (10/04/13): 1. Severe native CAD with all bypass grafts patent. c. CRT upgrade 12/2013.  [name] LBBB (left bundle branch block)   . Lipoma   . PAD (peripheral artery disease) ([place])   . PICC line infection    a. 01/2014 - PICC exchanged.  . Polysubstance abuse ([place])    history of  (cocaine, tobacco and ETOH)  . V-tach [facility])     Current Outpatient Medications  Medication Sig Dispense Refill  . ACCU-CHEK FASTCLIX LANCETS  MISC Use to test blood glucose 2 times daily. Dx:E11.9 102 each 12  . acetaminophen (TYLENOL) 500 MG tablet Take 500-1,000 mg by mouth every 6 (six) hours as needed (for pain.).    [name] amiodarone (PACERONE) 200 MG tablet Take 0.5 tablets (100 mg total) by mouth daily. 15 tablet 3  . aspirin EC 81 MG tablet Take 81 mg by mouth every morning.     [name] atorvastatin (LIPITOR) 80 MG tablet TAKE 1 TABLET BY MOUTH ONCE DAILY 90 tablet 3  . B-D ULTRAFINE III SHORT PEN 31G X 8 MM MISC USE TO INJECT INSULIN THREE TIMES DAILY 100 each 5  . Blood Glucose Monitoring Suppl (ACCU-CHEK NANO SMARTVIEW) W/DEVICE KIT Use to test blood glucose 2 times daily. Dx:E11.9 1 kit 0  . Cholecalciferol (VITAMIN D) 2000 units CAPS Take 2,000 Units by mouth daily.    . diclofenac sodium (VOLTAREN) 1 % GEL Apply 4 g topically 4 (four) times daily. (Patient taking differently: Apply 4 g topically 4 (four) times daily as needed (for pain.). ) 1 Tube 2  . digoxin (LANOXIN) 0.125 MG tablet TAKE 1/2 (ONE-HALF) TABLET BY MOUTH EVERY OTHER DAY 15 tablet 3  . furosemide (LASIX) 80 MG tablet Take 1 tablet (80 mg total) by mouth 2 (two) times daily. 30 tablet 3  . glucose blood (ACCU-CHEK SMARTVIEW) test strip Use to test blood glucose 2 times daily. Dx:E11.9 100 each 12  . Insulin Glargine (LANTUS SOLOSTAR) 100 UNIT/ML Solostar Pen Inject 33 units into the skin at bedtime.Diagnosis code:E11.8 (Patient taking differently: Inject 33 Units into the skin at bedtime. Inject 33 units into the skin at bedtime.Diagnosis code:E11.8) 15 pen 3  . losartan (COZAAR) 25 MG tablet TAKE ONE-HALF TABLET BY MOUTH TWICE DAILY 60 tablet 4  . metolazone (ZAROXOLYN) 2.5 MG tablet Take 1 tablet (2.5 mg total) by mouth once a week. Take every Friday. 15 tablet 3  . nitroGLYCERIN (NITROSTAT) 0.4 MG SL tablet Place 1 tablet (0.4 mg total) under the tongue every 5 (five) minutes as needed. For chest pain. 25 tablet 11  . potassium chloride SA (K-DUR,KLOR-CON) 20 MEQ  tablet Take 1 tablet (20 mEq total) by mouth daily. 90 tablet 3  . sitaGLIPtin (JANUVIA) 50 MG tablet Take 1 tablet (50 mg total) by mouth daily. (Patient taking differently: Take 50 mg by mouth every evening. ) 90 tablet 3  . spironolactone (ALDACTONE) 25 MG tablet Take 1 tablet (25 mg total) by mouth daily. 30 tablet 3  . allopurinol (ZYLOPRIM) 100 MG tablet TAKE 2 TABLETS BY MOUTH ONCE DAILY AT BEDTIME 180 tablet 3  . amoxicillin (AMOXIL) 500 MG capsule Take 4 capsules (2,000 mg total) by mouth as needed (1 hour before dental work). (Patient not taking: Reported on 05/01/2017) 4 capsule  1   No current facility-administered medications for this encounter.     Vitals:   05/01/17 1415  BP: 116/68  Pulse: 77  SpO2: 98%  Weight: 178 lb (80.7 kg)    Wt Readings from Last 3 Encounters:  05/01/17 178 lb (80.7 kg)  03/20/17 181 lb 6.4 oz (82.3 kg)  03/10/17 180 lb (81.6 kg)     PHYSICAL EXAM: General:  Well appearing. No resp difficulty HEENT: normal Neck: supple. no JVD. Carotids 2+ bilat; no bruits. No lymphadenopathy or thryomegaly appreciated. Cor: PMI nondisplaced. Regular rate & rhythm. 2/6 AS soft MR murmur  Lungs: clear Abdomen: soft, nontender, nondistended. No hepatosplenomegaly. No bruits or masses. Good bowel sounds. Extremities: no cyanosis, clubbing, rash, edema Neuro: alert & orientedx3, cranial nerves grossly intact. moves all 4 extremities w/o difficulty. Affect pleasant  ASSESSMENT & PLAN:  1. Chronic systolic CHF: Ischemic cardiomyopathy s/p Medtronic CRT-D, EF 20-25% ( Echo 04/2015).  - She follows in the transplant clinic at [facility] q 6 months with Dr. Radene Fields. Being worked up for heart-kidney transplant - She is blood type O - Echo 9/18 EF 25% RV ok  - Much improved with re-institution of weekly metolazone - Improved back to NYHA II  - Ideally would switch losartan to low dose Entesto but has had severe hypotension in recent past requring discontinuation of carvedilol  and reduction in losartan.  - Intolerant to coreg with severe fatigue and hypotension. Tried multiple times.  - [facility] transplant notes reviewed personally. Indicate possible need for heart-kidney listing but recent creatinine 1.2. This was in setting of volume overload so may be falsely low. Recheck today.  - Continue f/u with Dr. Radene Fields. - ICD interrogated personally in clinic: No VT/VF. Volume status much improved  2. CAD:  - Stable on recent cath 1/19 - No s/s ischemia. Continue ASA/statin  3. Mitral regurgitation:  -s/p mitral valve annuloplasty with CABG in 2007. Moderate to severe MR on echo in 4/15. TEE (06/2013) mitral regurgitation appeared severe, anatomy of repaired valve does not look suitable for MV clipping and would be high risk for MV replacement.  - mild to moderate MR on recent echo 9/18  4. CKD stage III:  - BMET today - Watch renal function carefull with change in diuretics - Has been referred to [facility]  5.  NSVT:  - Continue amiodarone 100 mg.daily  - LFTs and TSH stable recently.  -  No VT or ICD firings on ICD interrogation today in clinic (done personally)  6. Obesity - Much improved. Body mass index is 30.55 kg/m.   7. PAD:  - Symptoms stable - Right mid SFA stenosis per [place] 08/30/14.    8. Colon polyps - Had colonoscopy 06/08/14 with 12 polyps.  - Repeat colonoscopy pending   Glori Bickers, MD  05/01/17

## 2017-05-01 NOTE — Patient Instructions (Signed)
Lab today  Your physician recommends that you schedule a follow-up appointment in: 3 months  

## 2017-05-05 ENCOUNTER — Other Ambulatory Visit: Payer: Self-pay

## 2017-05-05 MED ORDER — GLUCOSE BLOOD VI STRP: ORAL_STRIP | 2 refills | Status: DC

## 2017-05-05 NOTE — Telephone Encounter (Signed)
Next appt scheduled  3/20 with PCP.

## 2017-05-05 NOTE — Telephone Encounter (Signed)
glucose blood (ACCU-CHEK SMARTVIEW) test strip   Refill request @ [facility] on [address].

## 2017-05-11 ENCOUNTER — Other Ambulatory Visit: Payer: Self-pay | Admitting: Gastroenterology

## 2017-05-19 DIAGNOSIS — I2510 Atherosclerotic heart disease of native coronary artery without angina pectoris: Secondary | ICD-10-CM | POA: Diagnosis not present

## 2017-05-19 DIAGNOSIS — N183 Chronic kidney disease, stage 3 (moderate): Secondary | ICD-10-CM | POA: Diagnosis not present

## 2017-05-19 DIAGNOSIS — Z794 Long term (current) use of insulin: Secondary | ICD-10-CM | POA: Diagnosis not present

## 2017-05-19 DIAGNOSIS — I5022 Chronic systolic (congestive) heart failure: Secondary | ICD-10-CM | POA: Diagnosis not present

## 2017-05-19 DIAGNOSIS — E119 Type 2 diabetes mellitus without complications: Secondary | ICD-10-CM | POA: Diagnosis not present

## 2017-05-20 ENCOUNTER — Encounter: Payer: Self-pay | Admitting: Internal Medicine

## 2017-05-22 ENCOUNTER — Encounter: Payer: Self-pay | Admitting: Internal Medicine

## 2017-05-26 ENCOUNTER — Other Ambulatory Visit: Payer: Self-pay | Admitting: *Deleted

## 2017-05-26 MED ORDER — INSULIN GLARGINE 100 UNIT/ML SOLOSTAR PEN: PEN_INJECTOR | SUBCUTANEOUS | 3 refills | Status: DC

## 2017-05-27 ENCOUNTER — Other Ambulatory Visit: Payer: Self-pay | Admitting: Internal Medicine

## 2017-05-27 NOTE — Telephone Encounter (Signed)
Refill Request   Patient is unable to obtain her Testing Strips as she states her Insurance will not cover them.  Pt would like a call back.

## 2017-05-28 MED ORDER — GLUCOSE BLOOD VI STRP: ORAL_STRIP | 2 refills | Status: DC

## 2017-05-28 NOTE — Telephone Encounter (Signed)
New prescription sent in for test strips to check 4 times daily (before three meals and before bedtime). This should be covered by insurance, please let me know if there are any issues. Thank you.

## 2017-05-29 ENCOUNTER — Telehealth (HOSPITAL_COMMUNITY): Payer: Self-pay | Admitting: Pharmacist

## 2017-05-29 NOTE — Telephone Encounter (Signed)
Metolazone PA already on file with [place] K. [name], PharmD, BCPS, CPP Clinical Pharmacist Phone: [phone] 05/29/2017 3:30 PM

## 2017-06-04 ENCOUNTER — Other Ambulatory Visit: Payer: Self-pay | Admitting: Internal Medicine

## 2017-06-08 ENCOUNTER — Other Ambulatory Visit (HOSPITAL_COMMUNITY): Payer: Self-pay

## 2017-06-08 MED ORDER — FUROSEMIDE 80 MG PO TABS: 80.0000 mg | ORAL_TABLET | Freq: Two times a day (BID) | ORAL | 3 refills | Status: DC

## 2017-06-08 MED ORDER — METOLAZONE 2.5 MG PO TABS: 2.5000 mg | ORAL_TABLET | ORAL | 3 refills | Status: DC

## 2017-06-10 ENCOUNTER — Other Ambulatory Visit: Payer: Self-pay | Admitting: Gastroenterology

## 2017-06-16 ENCOUNTER — Encounter (HOSPITAL_COMMUNITY): Payer: Self-pay | Admitting: *Deleted

## 2017-06-16 NOTE — Progress Notes (Signed)
Ms Soltau states that her CBG's run 100- 12o.  I instructed patient to take 16 units of Lantus and no Metformin in am.  I instructed patient to check CBG after awaking and every 2 hours until arrival  to the hospital.  I Instructed patient if CBG is less than 70 to drink 1/2 cup of a clear juice- apple juice. Recheck CBG in 15 minutes then call pre- op desk at [phone] for further instructions. If scheduled to receive Insulin, do not take Insulin

## 2017-06-16 NOTE — Anesthesia Preprocedure Evaluation (Addendum)
Anesthesia Evaluation  Patient identified by MRN, date of birth, ID band Patient awake    Reviewed: Allergy & Precautions, H&P , NPO status , Patient's Chart, lab work & pertinent test results  Airway Mallampati: II  TM Distance: >3 FB Neck ROM: full    Dental no notable dental hx. (+) Teeth Intact, Dental Advisory Given,    Pulmonary neg pulmonary ROS, former smoker,    Pulmonary exam normal breath sounds clear to auscultation       Cardiovascular Exercise Tolerance: Poor hypertension, Pt. on medications + CAD, + CABG, + Peripheral Vascular Disease and +CHF  Normal cardiovascular exam+ dysrhythmias Atrial Fibrillation and Ventricular Tachycardia + Cardiac Defibrillator  Rhythm:regular Rate:Normal  Chronic systolic heart failure. EF 15%. Severe MR 4/15. LBBB   Neuro/Psych negative neurological ROS  negative psych ROS   GI/Hepatic negative GI ROS, (+)     substance abuse  alcohol use, cocaine use and IV drug use,   Endo/Other  diabetes, Well Controlled, Type 2, Insulin Dependent  Renal/GU Renal diseaseStage 3 chronic kidney disease  negative genitourinary   Musculoskeletal   Abdominal   Peds  Hematology negative hematology ROS (+)   Anesthesia Other Findings   Reproductive/Obstetrics negative OB ROS                            Anesthesia Physical  Anesthesia Plan  ASA: IV  Anesthesia Plan: MAC   Post-op Pain Management:    Induction: Intravenous  PONV Risk Score and Plan: 2 and Propofol infusion and Treatment may vary due to age or medical condition  Airway Management Planned: Mask  Additional Equipment:   Intra-op Plan:   Post-operative Plan:   Informed Consent: I have reviewed the patients History and Physical, chart, labs and discussed the procedure including the risks, benefits and alternatives for the proposed anesthesia with the patient or authorized representative who  has indicated his/her understanding and acceptance.   Dental Advisory Given  Plan Discussed with: CRNA  Anesthesia Plan Comments:        Anesthesia Quick Evaluation

## 2017-06-17 ENCOUNTER — Encounter (HOSPITAL_COMMUNITY): Payer: Self-pay | Admitting: *Deleted

## 2017-06-17 ENCOUNTER — Ambulatory Visit (HOSPITAL_COMMUNITY): Payer: Medicare Other | Admitting: Anesthesiology

## 2017-06-17 ENCOUNTER — Encounter (HOSPITAL_COMMUNITY)
Admission: RE | Disposition: A | Discharge status: Home / Self Care | Payer: Self-pay | Source: Ambulatory Visit | Attending: Gastroenterology

## 2017-06-17 ENCOUNTER — Other Ambulatory Visit: Payer: Self-pay

## 2017-06-17 ENCOUNTER — Ambulatory Visit (HOSPITAL_COMMUNITY)
Admission: RE | Admit: 2017-06-17 | Discharge: 2017-06-17 | Disposition: A | Discharge status: Home / Self Care | Payer: Medicare Other | Source: Ambulatory Visit | Attending: Gastroenterology | Admitting: Gastroenterology

## 2017-06-17 DIAGNOSIS — K621 Rectal polyp: Secondary | ICD-10-CM | POA: Diagnosis not present

## 2017-06-17 DIAGNOSIS — Z794 Long term (current) use of insulin: Secondary | ICD-10-CM | POA: Insufficient documentation

## 2017-06-17 DIAGNOSIS — Z95810 Presence of automatic (implantable) cardiac defibrillator: Secondary | ICD-10-CM | POA: Insufficient documentation

## 2017-06-17 DIAGNOSIS — I130 Hypertensive heart and chronic kidney disease with heart failure and stage 1 through stage 4 chronic kidney disease, or unspecified chronic kidney disease: Secondary | ICD-10-CM | POA: Insufficient documentation

## 2017-06-17 DIAGNOSIS — Z860100 Personal history of colon polyps, unspecified: Secondary | ICD-10-CM

## 2017-06-17 DIAGNOSIS — D125 Benign neoplasm of sigmoid colon: Secondary | ICD-10-CM | POA: Diagnosis not present

## 2017-06-17 DIAGNOSIS — Z87891 Personal history of nicotine dependence: Secondary | ICD-10-CM | POA: Diagnosis not present

## 2017-06-17 DIAGNOSIS — I255 Ischemic cardiomyopathy: Secondary | ICD-10-CM | POA: Diagnosis not present

## 2017-06-17 DIAGNOSIS — Z7982 Long term (current) use of aspirin: Secondary | ICD-10-CM | POA: Diagnosis not present

## 2017-06-17 DIAGNOSIS — I447 Left bundle-branch block, unspecified: Secondary | ICD-10-CM | POA: Diagnosis not present

## 2017-06-17 DIAGNOSIS — Z951 Presence of aortocoronary bypass graft: Secondary | ICD-10-CM | POA: Diagnosis not present

## 2017-06-17 DIAGNOSIS — E1151 Type 2 diabetes mellitus with diabetic peripheral angiopathy without gangrene: Secondary | ICD-10-CM | POA: Insufficient documentation

## 2017-06-17 DIAGNOSIS — I4891 Unspecified atrial fibrillation: Secondary | ICD-10-CM | POA: Insufficient documentation

## 2017-06-17 DIAGNOSIS — E785 Hyperlipidemia, unspecified: Secondary | ICD-10-CM | POA: Diagnosis not present

## 2017-06-17 DIAGNOSIS — K640 First degree hemorrhoids: Secondary | ICD-10-CM | POA: Insufficient documentation

## 2017-06-17 DIAGNOSIS — I252 Old myocardial infarction: Secondary | ICD-10-CM | POA: Diagnosis not present

## 2017-06-17 DIAGNOSIS — I2510 Atherosclerotic heart disease of native coronary artery without angina pectoris: Secondary | ICD-10-CM | POA: Insufficient documentation

## 2017-06-17 DIAGNOSIS — E1122 Type 2 diabetes mellitus with diabetic chronic kidney disease: Secondary | ICD-10-CM | POA: Diagnosis not present

## 2017-06-17 DIAGNOSIS — N183 Chronic kidney disease, stage 3 (moderate): Secondary | ICD-10-CM | POA: Insufficient documentation

## 2017-06-17 DIAGNOSIS — I129 Hypertensive chronic kidney disease with stage 1 through stage 4 chronic kidney disease, or unspecified chronic kidney disease: Secondary | ICD-10-CM | POA: Diagnosis not present

## 2017-06-17 DIAGNOSIS — Z1211 Encounter for screening for malignant neoplasm of colon: Secondary | ICD-10-CM | POA: Diagnosis not present

## 2017-06-17 DIAGNOSIS — Z79899 Other long term (current) drug therapy: Secondary | ICD-10-CM | POA: Insufficient documentation

## 2017-06-17 DIAGNOSIS — I472 Ventricular tachycardia: Secondary | ICD-10-CM | POA: Diagnosis not present

## 2017-06-17 DIAGNOSIS — I5022 Chronic systolic (congestive) heart failure: Secondary | ICD-10-CM | POA: Insufficient documentation

## 2017-06-17 HISTORY — PX: COLONOSCOPY WITH PROPOFOL: SHX5780

## 2017-06-17 HISTORY — DX: Acute embolism and thrombosis of unspecified deep veins of unspecified lower extremity: I82.409

## 2017-06-17 LAB — GLUCOSE, CAPILLARY: GLUCOSE-CAPILLARY: 87 mg/dL (ref 65–99)

## 2017-06-17 SURGERY — COLONOSCOPY WITH PROPOFOL
Anesthesia: Monitor Anesthesia Care

## 2017-06-17 MED ORDER — LIDOCAINE HCL (CARDIAC) 20 MG/ML IV SOLN
INTRAVENOUS | Status: DC | PRN
  Administered 2017-06-17: 80 mg via INTRATRACHEAL

## 2017-06-17 MED ORDER — LACTATED RINGERS IV SOLN
INTRAVENOUS | Status: AC | PRN
  Administered 2017-06-17: 1000 mL via INTRAVENOUS

## 2017-06-17 MED ORDER — SODIUM CHLORIDE 0.9 % IV SOLN: INTRAVENOUS | Status: DC

## 2017-06-17 MED ORDER — EPHEDRINE SULFATE 50 MG/ML IJ SOLN
INTRAMUSCULAR | Status: DC | PRN
  Administered 2017-06-17: 10 mg via INTRAVENOUS

## 2017-06-17 MED ORDER — PHENYLEPHRINE HCL 10 MG/ML IJ SOLN
INTRAMUSCULAR | Status: DC | PRN
  Administered 2017-06-17: 80 ug via INTRAVENOUS

## 2017-06-17 MED ORDER — PROPOFOL 500 MG/50ML IV EMUL
INTRAVENOUS | Status: DC | PRN
  Administered 2017-06-17: 50 ug/kg/min via INTRAVENOUS

## 2017-06-17 SURGICAL SUPPLY — 21 items

## 2017-06-17 NOTE — Op Note (Signed)
[facility] Patient Name: [name] Procedure Date : 06/17/2017 MRN: [id number] Attending MD: Lear Ng , MD Date of Birth: 1955/06/03 CSN: [id number] Age: 62 Admit Type: Outpatient Procedure:                Colonoscopy Indications:              High risk colon cancer surveillance: Personal                            history of colonic polyps, Last colonoscopy: April                            2016 Providers:                Lear Ng, MD, Cleda Daub, RN, Elspeth Cho Tech., Technician, Gershon Crane CRNA, CRNA Referring MD:              Medicines:                Propofol per Anesthesia, Monitored Anesthesia Care Complications:            No immediate complications. Estimated Blood Loss:     Estimated blood loss was minimal. Procedure:                Pre-Anesthesia Assessment:                           - Prior to the procedure, a History and Physical                            was performed, and patient medications and                            allergies were reviewed. The patient's tolerance of                            previous anesthesia was also reviewed. The risks                            and benefits of the procedure and the sedation                            options and risks were discussed with the patient.                            All questions were answered, and informed consent                            was obtained. Prior Anticoagulants: The patient has                            taken no previous anticoagulant or antiplatelet  agents. ASA Grade Assessment: IV - A patient with                            severe systemic disease that is a constant threat                            to life. After reviewing the risks and benefits,                            the patient was deemed in satisfactory condition to                            undergo the procedure.          After obtaining informed consent, the colonoscope                            was passed under direct vision. Throughout the                            procedure, the patient's blood pressure, pulse, and                            oxygen saturations were monitored continuously. The                            EC-[id number] ([id number]) scope was introduced through                            the anus and advanced to the the cecum, identified                            by appendiceal orifice and ileocecal valve. The                            colonoscopy was performed with difficulty due to a                            tortuous colon and fair prep. Successful completion                            of the procedure was aided by straightening and                            shortening the scope to obtain bowel loop reduction                            and lavage. The patient tolerated the procedure                            well. The quality of the bowel preparation was                            fair. The  terminal ileum, the ileocecal valve, the                            appendiceal orifice and the rectum were                            photographed. Scope In: 9:20:27 AM Scope Out: 9:37:46 AM Scope Withdrawal Time: 0 hours 12 minutes 5 seconds  Total Procedure Duration: 0 hours 17 minutes 19 seconds  Findings:      The perianal and digital rectal examinations were normal.      A 2 mm polyp was found in the sigmoid colon. The polyp was sessile. The       polyp was removed with a cold biopsy forceps. Resection and retrieval       were complete. Estimated blood loss was minimal.      A 5 mm polyp was found in the sigmoid colon. The polyp was sessile. The       polyp was removed with a hot snare. Resection and retrieval were       complete. Estimated blood loss: none.      A 5 mm polyp was found in the rectum. The polyp was semi-sessile. The       polyp was removed with a hot snare. Resection  and retrieval were       complete. Estimated blood loss: none.      Internal hemorrhoids were found during retroflexion. The hemorrhoids       were small and Grade I (internal hemorrhoids that do not prolapse).      The terminal ileum appeared normal. Impression:               - Preparation of the colon was fair.                           - One 2 mm polyp in the sigmoid colon, removed with                            a cold biopsy forceps. Resected and retrieved.                           - One 5 mm polyp in the sigmoid colon, removed with                            a hot snare. Resected and retrieved.                           - One 5 mm polyp in the rectum, removed with a hot                            snare. Resected and retrieved.                           - Internal hemorrhoids.                           - The examined portion of the ileum was normal. Recommendation:           -  Patient has a contact number available for                            emergencies. The signs and symptoms of potential                            delayed complications were discussed with the                            patient. Return to normal activities tomorrow.                            Written discharge instructions were provided to the                            patient.                           - High fiber diet.                           - Await pathology results.                           - No ibuprofen, naproxen, or other non-steroidal                            anti-inflammatory drugs for 1 week.                           - Repeat colonoscopy for surveillance based on                            pathology results. Procedure Code(s):        --- Professional ---                           [phone], Colonoscopy, flexible; with removal of                            tumor(s), polyp(s), or other lesion(s) by snare                            technique                           45380, 13, Colonoscopy,  flexible; with biopsy,                            single or multiple Diagnosis Code(s):        --- Professional ---                           Z86.010, Personal history of colonic polyps                           D12.5, Benign neoplasm of sigmoid  colon                           K62.1, Rectal polyp                           K64.0, First degree hemorrhoids CPT copyright 2017 American Medical Association. All rights reserved. The codes documented in this report are preliminary and upon coder review may  be revised to meet current compliance requirements. Lear Ng, MD 06/17/2017 9:52:02 AM This report has been signed electronically. Number of Addenda: 0

## 2017-06-17 NOTE — Anesthesia Postprocedure Evaluation (Signed)
Anesthesia Post Note  Patient: [name]  Procedure(s) Performed: COLONOSCOPY WITH PROPOFOL (N/A )     Patient location during evaluation: PACU Anesthesia Type: MAC Level of consciousness: awake and alert Pain management: pain level controlled Vital Signs Assessment: post-procedure vital signs reviewed and stable Respiratory status: spontaneous breathing Cardiovascular status: stable Anesthetic complications: no    Last Vitals:  Vitals:   06/17/17 0955 06/17/17 1005  BP: (!) 97/52 (!) 93/53  Pulse: (!) 50 (!) 59  Resp: 18 (!) 24  Temp:    SpO2: 97% 100%    Last Pain:  Vitals:   06/17/17 1005  TempSrc:   PainSc: 0-No pain                 Nolon Nations

## 2017-06-17 NOTE — Transfer of Care (Addendum)
Immediate Anesthesia Transfer of Care Note  Patient: [name]  Procedure(s) Performed: COLONOSCOPY WITH PROPOFOL (N/A )  Patient Location: Endoscopy Unit  Anesthesia Type:MAC  Level of Consciousness: awake, alert , oriented and patient cooperative  Airway & Oxygen Therapy: Patient Spontanous Breathing  Post-op Assessment: Report given to RN and Post -op Vital signs reviewed and stable  Post vital signs: Reviewed and stable  Last Vitals:  Vitals Value Taken Time  BP    Temp    Pulse    Resp    SpO2      Last Pain:  Vitals:   06/17/17 0838  TempSrc: Oral  PainSc: 0-No pain         Complications: No apparent anesthesia complications

## 2017-06-17 NOTE — Discharge Instructions (Signed)
YOU HAD AN ENDOSCOPIC PROCEDURE TODAY: Refer to the procedure report and other information in the discharge instructions given to you for any specific questions about what was found during the examination. If this information does not answer your questions, please call [facility] office at [phone] to clarify.   YOU SHOULD EXPECT: Some feelings of bloating in the abdomen. Passage of more gas than usual. Walking can help get rid of the air that was put into your GI tract during the procedure and reduce the bloating. If you had a lower endoscopy (such as a colonoscopy or flexible sigmoidoscopy) you may notice spotting of blood in your stool or on the toilet paper. Some abdominal soreness may be present for a day or two, also.  DIET: Your first meal following the procedure should be a light meal and then it is ok to progress to your normal diet. A half-sandwich or bowl of soup is an example of a good first meal. Heavy or fried foods are harder to digest and may make you feel nauseous or bloated. Drink plenty of fluids but you should avoid alcoholic beverages for 24 hours. If you had a esophageal dilation, please see attached instructions for diet.   ACTIVITY: Your care partner should take you home directly after the procedure. You should plan to take it easy, moving slowly for the rest of the day. You can resume normal activity the day after the procedure however YOU SHOULD NOT DRIVE, use power tools, machinery or perform tasks that involve climbing or major physical exertion for 24 hours (because of the sedation medicines used during the test).   SYMPTOMS TO REPORT IMMEDIATELY: A gastroenterologist can be reached at any hour. Please call [phone]  for any of the following symptoms:  Following lower endoscopy (colonoscopy, flexible sigmoidoscopy) Excessive amounts of blood in the stool  Significant tenderness, worsening of abdominal pains  Swelling of the abdomen that is new, acute  Fever of 100 or  higher  Following upper endoscopy (EGD, EUS, ERCP, esophageal dilation) Vomiting of blood or coffee ground material  New, significant abdominal pain  New, significant chest pain or pain under the shoulder blades  Painful or persistently difficult swallowing  New shortness of breath  Black, tarry-looking or red, bloody stools  FOLLOW UP:  If any biopsies were taken you will be contacted by phone or by letter within the next 1-3 weeks. Call [phone]  if you have not heard about the biopsies in 3 weeks.  Please also call with any specific questions about appointments or follow up tests.  CONTINUE TAKING YOUR 81 mg ASPIRIN. HOLD IBUPROFEN PRODUCTS for the next 7 days.

## 2017-06-17 NOTE — H&P (Signed)
Date of Initial H&P: 06/10/17  History reviewed, patient examined, no change in status, stable for surgery.

## 2017-06-17 NOTE — Interval H&P Note (Signed)
History and Physical Interval Note:  06/17/2017 9:01 AM  [name]  has presented today for surgery, with the diagnosis of history of colon polyps  The various methods of treatment have been discussed with the patient and family. After consideration of risks, benefits and other options for treatment, the patient has consented to  Procedure(s): COLONOSCOPY WITH PROPOFOL (N/A) as a surgical intervention .  The patient's history has been reviewed, patient examined, no change in status, stable for surgery.  I have reviewed the patient's chart and labs.  Questions were answered to the patient's satisfaction.     Dauphin C.

## 2017-06-23 ENCOUNTER — Other Ambulatory Visit (HOSPITAL_COMMUNITY): Payer: Self-pay | Admitting: *Deleted

## 2017-06-23 ENCOUNTER — Telehealth (HOSPITAL_COMMUNITY): Payer: Self-pay

## 2017-06-23 MED ORDER — METOLAZONE 2.5 MG PO TABS: 2.5000 mg | ORAL_TABLET | ORAL | 3 refills | Status: DC

## 2017-06-23 NOTE — Telephone Encounter (Signed)
Pt called and left VM wanting to take metolazone called pt back and left a VM to get further information about symptoms

## 2017-06-26 ENCOUNTER — Other Ambulatory Visit: Payer: Self-pay | Admitting: *Deleted

## 2017-06-26 MED ORDER — INSULIN PEN NEEDLE 31G X 8 MM MISC: 5 refills | Status: DC

## 2017-07-01 ENCOUNTER — Other Ambulatory Visit (HOSPITAL_COMMUNITY): Payer: Self-pay | Admitting: *Deleted

## 2017-07-01 MED ORDER — METOLAZONE 2.5 MG PO TABS: 2.5000 mg | ORAL_TABLET | ORAL | 3 refills | Status: DC

## 2017-07-08 DIAGNOSIS — H43812 Vitreous degeneration, left eye: Secondary | ICD-10-CM | POA: Diagnosis not present

## 2017-07-08 DIAGNOSIS — H2513 Age-related nuclear cataract, bilateral: Secondary | ICD-10-CM | POA: Diagnosis not present

## 2017-07-08 DIAGNOSIS — E119 Type 2 diabetes mellitus without complications: Secondary | ICD-10-CM | POA: Diagnosis not present

## 2017-07-13 ENCOUNTER — Ambulatory Visit (INDEPENDENT_AMBULATORY_CARE_PROVIDER_SITE_OTHER): Payer: Medicare Other | Admitting: Internal Medicine

## 2017-07-13 ENCOUNTER — Ambulatory Visit (INDEPENDENT_AMBULATORY_CARE_PROVIDER_SITE_OTHER): Payer: Medicare Other | Admitting: *Deleted

## 2017-07-13 ENCOUNTER — Encounter: Payer: Self-pay | Admitting: Internal Medicine

## 2017-07-13 ENCOUNTER — Encounter

## 2017-07-13 VITALS — BP 100/60 | HR 79 | Ht 64.0 in | Wt 186.0 lb

## 2017-07-13 DIAGNOSIS — Z95810 Presence of automatic (implantable) cardiac defibrillator: Secondary | ICD-10-CM | POA: Diagnosis not present

## 2017-07-13 DIAGNOSIS — I472 Ventricular tachycardia, unspecified: Secondary | ICD-10-CM

## 2017-07-13 DIAGNOSIS — I428 Other cardiomyopathies: Secondary | ICD-10-CM

## 2017-07-13 DIAGNOSIS — I5022 Chronic systolic (congestive) heart failure: Secondary | ICD-10-CM | POA: Diagnosis not present

## 2017-07-13 DIAGNOSIS — I447 Left bundle-branch block, unspecified: Secondary | ICD-10-CM | POA: Diagnosis not present

## 2017-07-13 DIAGNOSIS — I2510 Atherosclerotic heart disease of native coronary artery without angina pectoris: Secondary | ICD-10-CM

## 2017-07-13 NOTE — Progress Notes (Signed)
Patient Care Team: Zada Finders, MD as PCP - General Deboraha Sprang, MD (Cardiology) Bensimhon, Shaune Pascal, MD (Cardiology)   HPI  [name] is a 62 y.o. 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  She has a history of inappropriate shocks 8/15  Started with amiodarone. She underwent CRT upgrade at that time. Catheterization 2013-January demonstrated patent grafts cardiac index 2.2 ejection fraction 10-15%  Electrocardiogram April 2013 demonstrated a QRS duration of 124 ms repeat today is 126.   She's been seen at [facility] for consideration of transplant evaluation of heart and possibly kidney.  Most recently she has been feeling pretty well.  She denies significant shortness of breath.  There has been no edema.  DATE TEST    2/13 Cath   EF 10-15 %   9/15 LHC  Patent graft  2/17 Echo   EF 20-25% %   9/18 Echo  Ef 20-25%   1/19 LHC  3V CAD patent grafts   Date TSH LFTs Cr K  9/17  4.8 48      2/18   1.73   12/18 <0.015(1/19) 69 1.63   3/19   1.48 4.8  \  Records and Results Reviewed  CHF notes  Past Medical History:  Diagnosis Date  . AICD (automatic cardioverter/defibrillator) present   . Chronic systolic heart failure ([place])    a. ICM b. ECHO (06/2013): EF 15%, severe MR (06/2013) c. RHC (10/2013): RA 5, RV 23/2/5, PA 25/6 (14), PCWP 12, Fick CO/CI: 3.2/1.7, PVR 0.7 WU, PA 45%, 47% and 55% (with levophed 5), vagal response during cath. d. On home milrinone.  . CKD (chronic kidney disease), stage III ([place])   . Coronary artery disease    a. s/p CABG x 5 with MV annuloplasty 2007   . Diabetes mellitus   . DVT (deep venous thrombosis) ([place]) 2009  . Hypertension   . Implantable defibrillator   medtronic   . Ischemic cardiomyopathy    a. s/p ICD. b. LHC (10/04/13): 1. Severe native CAD with all bypass grafts patent. c. CRT upgrade 12/2013.  [name]  LBBB (left bundle branch block)   . Lipoma   . PAD (peripheral artery disease) ([place])   . PICC line infection    a. 01/2014 - PICC exchanged.  . Polysubstance abuse ([place])    history of  (cocaine, tobacco and ETOH)  . Presence of permanent cardiac pacemaker   . V-tach [facility])     Past Surgical History:  Procedure Laterality Date  . ANKLE FRACTURE SURGERY Right    plate inserted  . ANKLE SURGERY Right    plate removed  . BI-VENTRICULAR IMPLANTABLE CARDIOVERTER DEFIBRILLATOR UPGRADE  12/02/2013   MDT [place] CRTD upgrade by Dr Caryl Comes  . BI-VENTRICULAR IMPLANTABLE CARDIOVERTER DEFIBRILLATOR UPGRADE N/A 12/02/2013   Procedure: BI-VENTRICULAR IMPLANTABLE CARDIOVERTER DEFIBRILLATOR UPGRADE;  Surgeon: Deboraha Sprang, MD;  Location: [facility] CATH LAB;  Service: Cardiovascular;  Laterality: N/A;  . CARDIAC CATHETERIZATION    . CARDIAC DEFIBRILLATOR PLACEMENT     2011, Followed By Dr. Caryl Comes  . COLONOSCOPY WITH PROPOFOL N/A 06/08/2014   Procedure: COLONOSCOPY WITH PROPOFOL;  Surgeon: Wilford Corner, MD;  Location: [facility] ENDOSCOPY;  Service: Endoscopy;  Laterality: N/A;  . COLONOSCOPY WITH PROPOFOL N/A 06/17/2017   Procedure: COLONOSCOPY WITH PROPOFOL;  Surgeon: Wilford Corner, MD;  Location: [place];  Service: Endoscopy;  Laterality: N/A;  . CORONARY ARTERY BYPASS GRAFT  10/2005  . LEFT AND RIGHT HEART CATHETERIZATION WITH CORONARY/GRAFT ANGIOGRAM N/A 03/17/2011   Procedure: LEFT AND RIGHT HEART CATHETERIZATION WITH Beatrix Fetters;  Surgeon: Jolaine Artist, MD;  Location: [facility] CATH LAB;  Service: Cardiovascular;  Laterality: N/A;  . LEFT AND RIGHT HEART CATHETERIZATION WITH CORONARY/GRAFT ANGIOGRAM N/A 10/04/2013   Procedure: LEFT AND RIGHT HEART CATHETERIZATION WITH Beatrix Fetters;  Surgeon: Jolaine Artist, MD;  Location: [facility] CATH LAB;  Service: Cardiovascular;  Laterality: N/A;  . RIGHT HEART CATHETERIZATION N/A 05/23/2013   Procedure: RIGHT HEART CATH;  Surgeon: Jolaine Artist,  MD;  Location: [facility] CATH LAB;  Service: Cardiovascular;  Laterality: N/A;  . RIGHT HEART CATHETERIZATION N/A 11/01/2013   Procedure: RIGHT HEART CATH;  Surgeon: Jolaine Artist, MD;  Location: [facility] CATH LAB;  Service: Cardiovascular;  Laterality: N/A;  . RIGHT HEART CATHETERIZATION N/A 11/25/2013   Procedure: RIGHT HEART CATH;  Surgeon: Jolaine Artist, MD;  Location: [facility] CATH LAB;  Service: Cardiovascular;  Laterality: N/A;  . RIGHT/LEFT HEART CATH AND CORONARY/GRAFT ANGIOGRAPHY N/A 03/10/2017   Procedure: RIGHT/LEFT HEART CATH AND CORONARY/GRAFT ANGIOGRAPHY;  Surgeon: Jolaine Artist, MD;  Location: [place] CV LAB;  Service: Cardiovascular;  Laterality: N/A;  . TEE WITHOUT CARDIOVERSION N/A 06/02/2013   Procedure: TRANSESOPHAGEAL ECHOCARDIOGRAM (TEE);  Surgeon: Larey Dresser, MD;  Location: [facility] ENDOSCOPY;  Service: Cardiovascular;  Laterality: N/A;    Current Outpatient Medications  Medication Sig Dispense Refill  . ACCU-CHEK FASTCLIX LANCETS MISC Use to test blood glucose 2 times daily. Dx:E11.9 102 each 12  . acetaminophen (TYLENOL) 500 MG tablet Take 500-1,000 mg by mouth every 6 (six) hours as needed for moderate pain or headache.     . allopurinol (ZYLOPRIM) 100 MG tablet TAKE 2 TABLETS BY MOUTH ONCE DAILY AT BEDTIME (Patient taking differently: Take 200 mg by mouth at bedtime. ) 180 tablet 3  . amiodarone (PACERONE) 200 MG tablet Take 0.5 tablets (100 mg total) by mouth daily. 15 tablet 3  . aspirin EC 81 MG tablet Take 81 mg by mouth every morning.     [name] atorvastatin (LIPITOR) 80 MG tablet TAKE 1 TABLET BY MOUTH ONCE DAILY (Patient taking differently: TAKE 80 MG BY MOUTH ONCE DAILY) 90 tablet 3  . Blood Glucose Monitoring Suppl (ACCU-CHEK NANO SMARTVIEW) W/DEVICE KIT Use to test blood glucose 2 times daily. Dx:E11.9 1 kit 0  . Cholecalciferol (VITAMIN D PO) Take 5,000 Units by mouth daily.     . diclofenac sodium (VOLTAREN) 1 % GEL Apply 4 g topically 4 (four) times daily. (Patient  taking differently: Apply 4 g topically 4 (four) times daily as needed (for pain.). ) 1 Tube 2  . digoxin (LANOXIN) 0.125 MG tablet TAKE 1/2 (ONE-HALF) TABLET BY MOUTH EVERY OTHER DAY (Patient taking differently: TAKE 0.0625 MG TABLET BY MOUTH EVERY OTHER DAY) 15 tablet 3  . furosemide (LASIX) 80 MG tablet Take 1 tablet (80 mg total) by mouth 2 (two) times daily. 30 tablet 3  . glucose blood (ACCU-CHEK SMARTVIEW) test strip Use to test blood glucose 4 times daily (before meals and at bedtime). Dx:E11.9. INSULIN DEPENDENT 100 each 2  . Insulin Glargine (LANTUS SOLOSTAR) 100 UNIT/ML Solostar Pen Inject 33 units into the skin at bedtime.Diagnosis code:E11.8 15 pen 3  . Insulin Pen Needle (B-D ULTRAFINE III SHORT PEN) 31G X 8 MM MISC USE TO INJECT INSULIN THREE TIMES DAILY 100 each 5  .  losartan (COZAAR) 25 MG tablet TAKE ONE-HALF TABLET BY MOUTH TWICE DAILY (Patient taking differently: TAKE 12.5 MG BY MOUTH TWICE DAILY) 60 tablet 4  . metolazone (ZAROXOLYN) 2.5 MG tablet Take 1 tablet (2.5 mg total) by mouth once a week. May take an additional tablet ONLY as directed by the CHF clinic. 15 tablet 3  . nitroGLYCERIN (NITROSTAT) 0.4 MG SL tablet Place 1 tablet (0.4 mg total) under the tongue every 5 (five) minutes as needed. For chest pain. (Patient taking differently: Place 0.4 mg under the tongue every 5 (five) minutes as needed for chest pain. ) 25 tablet 11  . potassium chloride SA (K-DUR,KLOR-CON) 20 MEQ tablet Take 1 tablet (20 mEq total) by mouth daily. 90 tablet 3  . sitaGLIPtin (JANUVIA) 50 MG tablet Take 1 tablet (50 mg total) by mouth daily. (Patient taking differently: Take 50 mg by mouth at bedtime. ) 90 tablet 3  . spironolactone (ALDACTONE) 25 MG tablet Take 1 tablet (25 mg total) by mouth daily. 30 tablet 3   No current facility-administered medications for this visit.     No Known Allergies    Review of Systems negative except from HPI and PMH  Physical Exam BP 100/60   Pulse 79    Ht _0  (1.626 m)   Wt 186 lb (84.4 kg)   SpO2 96%   BMI 31.93 kg/m  ,Well developed and nourished in no acute distress HENT normal Neck supple with JVP-flat Clear Regular rate and rhythm, no murmurs or gallops Abd-soft with active BS No Clubbing cyanosis edema Skin-warm and dry A & Oriented  Grossly normal sensory and motor function  ECG demonstrates P synchronous pacing.  QRS is QR in lead I and rS in lead V1 Assessment and  Plan   Nonischemic/valvular cardiomyopathy  Coronary artery disease Without symptoms of ischemia  LBBB  CHF chronic systolic   Ventricular Tachycardia   Hyperthyroidism  Hypertransaminasemia  Implantable defibrillator-Medtronic _ CRT   LV threshold high   High Risk Medication Surveillance   She is euvolemic.  Laboratories are concerning for amiodarone toxicity.  We will check thyroid tests and LFTs again today as they were abnormal number of months ago.  She is approaching ERI.  Reviewing the notes regarding possible transplantation as well as the procedural note from her CRT, I would not be inclined to attempt a repeat LV lead positioning endovascularly.  I would accept the high threshold and anticipate that there will be interval transplantation.  More than 50% of 40 min was spent in counseling related to the above

## 2017-07-13 NOTE — Patient Instructions (Addendum)
Medication Instructions:  Your physician recommends that you continue on your current medications as directed. Please refer to the Current Medication list given to you today.  Labwork: You will have labs drawn today: BMP, CBC, LFTs, and TSH  Testing/Procedures: None ordered.  Follow-Up: Your physician wants you to follow-up in: One Year with Dr Caryl Comes.  You will receive a reminder letter in the mail two months in advance. If you don't receive a letter, please call our office to schedule the follow-up appointment.  Remote monitoring is used to monitor your ICD from home. This monitoring  reduces the number of office visits required to check your device to one time per year. It allows [place] to keep an eye on the functioning of your device to ensure it is working properly. You are scheduled for a device check from home on 10/12/2017. You may send your transmission at any time that day. If you have a wireless device, the transmission will be sent automatically. After your physician reviews your transmission, you will receive a postcard with your next transmission date.     Any Other Special Instructions Will Be Listed Below (If Applicable).     If you need a refill on your cardiac medications before your next appointment, please call your pharmacy.

## 2017-07-14 ENCOUNTER — Telehealth: Payer: Self-pay

## 2017-07-14 LAB — CUP PACEART REMOTE DEVICE CHECK
Battery Remaining Longevity: 5 mo
Brady Statistic AP VS Percent: 0.06 %
Brady Statistic AS VP Percent: 96.03 %
Brady Statistic AS VS Percent: 1.77 %
Brady Statistic RA Percent Paced: 2.19 %
Brady Statistic RV Percent Paced: 97.23 %
Date Time Interrogation Session: 20190513084223
HIGH POWER IMPEDANCE MEASURED VALUE: 52 Ohm
HighPow Impedance: 43 Ohm
Implantable Lead Implant Date: 20111012
Implantable Lead Implant Date: 20151002
Implantable Lead Location: 753857
Implantable Lead Location: 753859
Implantable Pulse Generator Implant Date: 20151002
Lead Channel Impedance Value: 494 Ohm
Lead Channel Impedance Value: 494 Ohm
Lead Channel Impedance Value: 551 Ohm
Lead Channel Impedance Value: 551 Ohm
Lead Channel Pacing Threshold Amplitude: 4.5 V
Lead Channel Pacing Threshold Pulse Width: 0.4 ms
Lead Channel Pacing Threshold Pulse Width: 0.4 ms
Lead Channel Sensing Intrinsic Amplitude: 31.625 mV
Lead Channel Setting Pacing Amplitude: 2 V
Lead Channel Setting Pacing Amplitude: 2.5 V
Lead Channel Setting Pacing Amplitude: 4.5 V
Lead Channel Setting Pacing Pulse Width: 0.4 ms
Lead Channel Setting Pacing Pulse Width: 1 ms
MDC IDC LEAD IMPLANT DT: 20151002
MDC IDC LEAD LOCATION: 753860
MDC IDC MSMT BATTERY VOLTAGE: 2.81 V
MDC IDC MSMT LEADCHNL LV IMPEDANCE VALUE: 893 Ohm
MDC IDC MSMT LEADCHNL LV PACING THRESHOLD PULSEWIDTH: 1 ms
MDC IDC MSMT LEADCHNL RA IMPEDANCE VALUE: 437 Ohm
MDC IDC MSMT LEADCHNL RA PACING THRESHOLD AMPLITUDE: 0.625 V
MDC IDC MSMT LEADCHNL RA SENSING INTR AMPL: 0.875 mV
MDC IDC MSMT LEADCHNL RA SENSING INTR AMPL: 0.875 mV
MDC IDC MSMT LEADCHNL RV PACING THRESHOLD AMPLITUDE: 0.75 V
MDC IDC MSMT LEADCHNL RV SENSING INTR AMPL: 31.625 mV
MDC IDC SET LEADCHNL RV SENSING SENSITIVITY: 0.45 mV
MDC IDC STAT BRADY AP VP PERCENT: 2.13 %

## 2017-07-14 LAB — HEPATIC FUNCTION PANEL
ALBUMIN: 5 g/dL (ref 3.5–5.5)
ALK PHOS: 105 IU/L (ref 39–117)
ALT: 21 IU/L (ref 0–32)
AST: 26 IU/L (ref 0–40)
Bilirubin Total: 0.6 mg/dL (ref 0.0–1.2)
Bilirubin, Direct: 0.15 mg/dL (ref 0.00–0.40)
TOTAL PROTEIN: 7.6 g/dL (ref 6.0–8.5)

## 2017-07-14 LAB — CBC WITH DIFFERENTIAL/PLATELET
BASOS ABS: 0 10*3/uL (ref 0.0–0.2)
BASOS: 1 %
EOS (ABSOLUTE): 0.1 10*3/uL (ref 0.0–0.4)
Eos: 1 %
Hematocrit: 38.5 % (ref 34.0–46.6)
Hemoglobin: 13.2 g/dL (ref 11.1–15.9)
Immature Grans (Abs): 0 10*3/uL (ref 0.0–0.1)
Immature Granulocytes: 0 %
LYMPHS ABS: 1.7 10*3/uL (ref 0.7–3.1)
Lymphs: 26 %
MCH: 28.1 pg (ref 26.6–33.0)
MCHC: 34.3 g/dL (ref 31.5–35.7)
MCV: 82 fL (ref 79–97)
MONOS ABS: 0.3 10*3/uL (ref 0.1–0.9)
Monocytes: 5 %
NEUTROS ABS: 4.5 10*3/uL (ref 1.4–7.0)
Neutrophils: 67 %
PLATELETS: 134 10*3/uL — AB (ref 150–379)
RBC: 4.7 x10E6/uL (ref 3.77–5.28)
RDW: 21.3 % — ABNORMAL HIGH (ref 12.3–15.4)
WBC: 6.6 10*3/uL (ref 3.4–10.8)

## 2017-07-14 LAB — CUP PACEART INCLINIC DEVICE CHECK
Battery Remaining Longevity: 5 mo
Brady Statistic AP VS Percent: 0.05 %
Brady Statistic AS VS Percent: 2.24 %
Brady Statistic RV Percent Paced: 96.66 %
Date Time Interrogation Session: 20190513182608
HIGH POWER IMPEDANCE MEASURED VALUE: 44 Ohm
HighPow Impedance: 59 Ohm
Implantable Lead Implant Date: 20151002
Implantable Lead Location: 753859
Implantable Lead Model: 4396
Implantable Lead Model: 5076
Implantable Lead Model: 6947
Implantable Pulse Generator Implant Date: 20151002
Lead Channel Impedance Value: 1045 Ohm
Lead Channel Impedance Value: 551 Ohm
Lead Channel Impedance Value: 570 Ohm
Lead Channel Impedance Value: 608 Ohm
Lead Channel Pacing Threshold Amplitude: 1 V
Lead Channel Pacing Threshold Amplitude: 4.25 V
Lead Channel Pacing Threshold Pulse Width: 0.4 ms
Lead Channel Pacing Threshold Pulse Width: 1 ms
Lead Channel Sensing Intrinsic Amplitude: 1 mV
Lead Channel Sensing Intrinsic Amplitude: 24.875 mV
Lead Channel Setting Pacing Amplitude: 2 V
Lead Channel Setting Pacing Amplitude: 2.5 V
Lead Channel Setting Pacing Pulse Width: 0.4 ms
Lead Channel Setting Pacing Pulse Width: 1 ms
Lead Channel Setting Sensing Sensitivity: 0.45 mV
MDC IDC LEAD IMPLANT DT: 20111012
MDC IDC LEAD IMPLANT DT: 20151002
MDC IDC LEAD LOCATION: 753857
MDC IDC LEAD LOCATION: 753860
MDC IDC MSMT BATTERY VOLTAGE: 2.81 V
MDC IDC MSMT LEADCHNL LV IMPEDANCE VALUE: 646 Ohm
MDC IDC MSMT LEADCHNL RA IMPEDANCE VALUE: 456 Ohm
MDC IDC MSMT LEADCHNL RA PACING THRESHOLD AMPLITUDE: 0.75 V
MDC IDC MSMT LEADCHNL RV PACING THRESHOLD PULSEWIDTH: 0.4 ms
MDC IDC SET LEADCHNL LV PACING AMPLITUDE: 4.75 V
MDC IDC STAT BRADY AP VP PERCENT: 1.62 %
MDC IDC STAT BRADY AS VP PERCENT: 96.09 %
MDC IDC STAT BRADY RA PERCENT PACED: 1.66 %

## 2017-07-14 LAB — BASIC METABOLIC PANEL
BUN / CREAT RATIO: 23 (ref 9–23)
BUN: 54 mg/dL — ABNORMAL HIGH (ref 6–24)
CALCIUM: 10 mg/dL (ref 8.7–10.2)
CHLORIDE: 93 mmol/L — AB (ref 96–106)
CO2: 22 mmol/L (ref 20–29)
Creatinine, Ser: 2.3 mg/dL — ABNORMAL HIGH (ref 0.57–1.00)
GFR calc non Af Amer: 23 mL/min/{1.73_m2} — ABNORMAL LOW (ref >59)
GFR, EST AFRICAN AMERICAN: 26 mL/min/{1.73_m2} — AB (ref >59)
Glucose: 165 mg/dL — ABNORMAL HIGH (ref 65–99)
POTASSIUM: 4.1 mmol/L (ref 3.5–5.2)
SODIUM: 137 mmol/L (ref 134–144)

## 2017-07-14 LAB — TSH: TSH: 10.57 u[IU]/mL — AB (ref 0.450–4.500)

## 2017-07-14 NOTE — Telephone Encounter (Signed)
Left message for patient for the following orders based off decreased kidney function:  Dr Caryl Comes would like for her to decrease her lasix to 80mg  once per day and stop her Aldactone. He will be in contact with Dr Haroldine Laws regarding additional recommendations in the near future.

## 2017-07-14 NOTE — Progress Notes (Signed)
Remote ICD transmission.   

## 2017-07-15 ENCOUNTER — Encounter: Payer: Self-pay | Admitting: Cardiology

## 2017-07-16 NOTE — Telephone Encounter (Signed)
Pt called back and verbalized understanding of her recent medication changes. I told pt Dr Caryl Comes would like for her to be sure to follow up with Dr Bensimhon's office to be sure she does not become fluid overloaded with her current medication changes. She has had some recent changes in creatinine, GFR, and TSH levels.

## 2017-07-17 ENCOUNTER — Telehealth (HOSPITAL_COMMUNITY): Payer: Self-pay

## 2017-07-17 ENCOUNTER — Ambulatory Visit (HOSPITAL_COMMUNITY)
Admission: RE | Admit: 2017-07-17 | Discharge: 2017-07-17 | Disposition: A | Discharge status: Home / Self Care | Payer: Medicare Other | Source: Ambulatory Visit | Attending: Internal Medicine | Admitting: Internal Medicine

## 2017-07-17 DIAGNOSIS — I5022 Chronic systolic (congestive) heart failure: Secondary | ICD-10-CM | POA: Diagnosis not present

## 2017-07-17 LAB — BASIC METABOLIC PANEL
ANION GAP: 10 (ref 5–15)
BUN: 37 mg/dL — AB (ref 6–20)
CO2: 31 mmol/L (ref 22–32)
Calcium: 9.8 mg/dL (ref 8.9–10.3)
Chloride: 97 mmol/L — ABNORMAL LOW (ref 101–111)
Creatinine, Ser: 2.13 mg/dL — ABNORMAL HIGH (ref 0.44–1.00)
GFR, EST AFRICAN AMERICAN: 28 mL/min — AB (ref >60)
GFR, EST NON AFRICAN AMERICAN: 24 mL/min — AB (ref >60)
Glucose, Bld: 144 mg/dL — ABNORMAL HIGH (ref 65–99)
POTASSIUM: 4.2 mmol/L (ref 3.5–5.1)
SODIUM: 138 mmol/L (ref 135–145)

## 2017-07-17 NOTE — Telephone Encounter (Signed)
Per Dr Haroldine Laws, f/u with pt and see if she is having any swelling or increased SOB, will need repeat bmet either today or Monday.  Attempted to call pt to f/u with her and Left message to call back

## 2017-07-17 NOTE — Telephone Encounter (Signed)
You Just now (2:03 PM)      Pt is coming in today for lab work. Pt denies edema or SOB. She is scheduled to take metolazone today.       Documentation     Schub, Marsh Dolly, RN routed conversation to Scarlette Calico, RN 2 hours ago (11:49 AM)    Scarlette Calico, RN 2 hours ago (11:48 AM)      Per Dr Haroldine Laws, f/u with pt and see if she is having any swelling or increased SOB, will need repeat bmet either today or Monday.  Attempted to call pt to f/u with her and Left message to call back        Documentation     Dollene Primrose, RN  You 23 hours ago (2:33 PM)    Hello Tibor Lemmons,   Ms Seifried came in to see Dr Caryl Comes the other day and we had labs drawn. Creatinine, GFR, and TSH came back significantly abnormal compared to her last labs about a month ago. Dr Caryl Comes was in contact with DB regarding recent medication changes. I advised pt to follow up with you all to monitor her fluid status and labs.   Thank you,  Lorren   Routing comment     Dollene Primrose, RN 23 hours ago (2:28 PM)      Pt called back and verbalized understanding of her recent medication changes. I told pt Dr Caryl Comes would like for her to be sure to follow up with Dr Bensimhon's office to be sure she does not become fluid overloaded with her current medication changes. She has had some recent changes in creatinine, GFR, and TSH levels.       Documentation     Dollene Primrose, RN 3 days ago      Left message for patient for the following orders based off decreased kidney function:  Dr Caryl Comes would like for her to decrease her lasix to 80mg  once per day and stop her Aldactone. He will be in contact with Dr Haroldine Laws regarding additional recommendations in the near future.       Documentation     Dollene Primrose, RN  Hunt[place] Estelle J 3 days ago

## 2017-07-17 NOTE — Telephone Encounter (Signed)
Pt is coming in today for lab work. Pt denies edema or SOB. She is scheduled to take metolazone today.

## 2017-07-20 ENCOUNTER — Telehealth (HOSPITAL_COMMUNITY): Payer: Self-pay | Admitting: *Deleted

## 2017-07-20 DIAGNOSIS — I5022 Chronic systolic (congestive) heart failure: Secondary | ICD-10-CM

## 2017-07-20 NOTE — Telephone Encounter (Signed)
Result Notes for Basic metabolic panel   Notes recorded by Darron Doom, RN on 07/20/2017 at 8:58 AM EDT Called and spoke with patient, she is aware and agreeable to come back next week. Appointment scheduled and bmet ordered. ------  Notes recorded by Jolaine Artist, MD on 07/18/2017 at 5:35 PM EDT Renal function improving. Recheck again in 1 week.

## 2017-07-22 ENCOUNTER — Other Ambulatory Visit: Payer: Self-pay

## 2017-07-22 ENCOUNTER — Ambulatory Visit (INDEPENDENT_AMBULATORY_CARE_PROVIDER_SITE_OTHER): Payer: Medicare Other | Admitting: Internal Medicine

## 2017-07-22 ENCOUNTER — Encounter: Payer: Self-pay | Admitting: Internal Medicine

## 2017-07-22 VITALS — BP 107/55 | HR 66 | Temp 98.1°F | Ht 64.0 in | Wt 190.4 lb

## 2017-07-22 DIAGNOSIS — M109 Gout, unspecified: Secondary | ICD-10-CM | POA: Diagnosis not present

## 2017-07-22 DIAGNOSIS — E1159 Type 2 diabetes mellitus with other circulatory complications: Secondary | ICD-10-CM

## 2017-07-22 DIAGNOSIS — Z8679 Personal history of other diseases of the circulatory system: Secondary | ICD-10-CM

## 2017-07-22 DIAGNOSIS — I5022 Chronic systolic (congestive) heart failure: Secondary | ICD-10-CM | POA: Diagnosis not present

## 2017-07-22 DIAGNOSIS — Z794 Long term (current) use of insulin: Secondary | ICD-10-CM | POA: Diagnosis not present

## 2017-07-22 DIAGNOSIS — R011 Cardiac murmur, unspecified: Secondary | ICD-10-CM | POA: Diagnosis not present

## 2017-07-22 DIAGNOSIS — I130 Hypertensive heart and chronic kidney disease with heart failure and stage 1 through stage 4 chronic kidney disease, or unspecified chronic kidney disease: Secondary | ICD-10-CM | POA: Diagnosis not present

## 2017-07-22 DIAGNOSIS — Z7982 Long term (current) use of aspirin: Secondary | ICD-10-CM

## 2017-07-22 DIAGNOSIS — R87810 Cervical high risk human papillomavirus (HPV) DNA test positive: Secondary | ICD-10-CM

## 2017-07-22 DIAGNOSIS — Z9861 Coronary angioplasty status: Secondary | ICD-10-CM

## 2017-07-22 DIAGNOSIS — Z79899 Other long term (current) drug therapy: Secondary | ICD-10-CM | POA: Diagnosis not present

## 2017-07-22 DIAGNOSIS — I340 Nonrheumatic mitral (valve) insufficiency: Secondary | ICD-10-CM

## 2017-07-22 DIAGNOSIS — Z951 Presence of aortocoronary bypass graft: Secondary | ICD-10-CM | POA: Diagnosis not present

## 2017-07-22 DIAGNOSIS — E1121 Type 2 diabetes mellitus with diabetic nephropathy: Secondary | ICD-10-CM

## 2017-07-22 DIAGNOSIS — N183 Chronic kidney disease, stage 3 unspecified: Secondary | ICD-10-CM

## 2017-07-22 DIAGNOSIS — Z95810 Presence of automatic (implantable) cardiac defibrillator: Secondary | ICD-10-CM | POA: Diagnosis not present

## 2017-07-22 DIAGNOSIS — E032 Hypothyroidism due to medicaments and other exogenous substances: Secondary | ICD-10-CM | POA: Diagnosis not present

## 2017-07-22 DIAGNOSIS — K5900 Constipation, unspecified: Secondary | ICD-10-CM | POA: Diagnosis not present

## 2017-07-22 DIAGNOSIS — Z87891 Personal history of nicotine dependence: Secondary | ICD-10-CM | POA: Diagnosis not present

## 2017-07-22 DIAGNOSIS — I255 Ischemic cardiomyopathy: Secondary | ICD-10-CM | POA: Diagnosis not present

## 2017-07-22 DIAGNOSIS — I2510 Atherosclerotic heart disease of native coronary artery without angina pectoris: Secondary | ICD-10-CM

## 2017-07-22 DIAGNOSIS — E1122 Type 2 diabetes mellitus with diabetic chronic kidney disease: Secondary | ICD-10-CM | POA: Diagnosis present

## 2017-07-22 DIAGNOSIS — T382X5S Adverse effect of antithyroid drugs, sequela: Secondary | ICD-10-CM

## 2017-07-22 DIAGNOSIS — E1151 Type 2 diabetes mellitus with diabetic peripheral angiopathy without gangrene: Secondary | ICD-10-CM

## 2017-07-22 DIAGNOSIS — T462X1A Poisoning by other antidysrhythmic drugs, accidental (unintentional), initial encounter: Secondary | ICD-10-CM

## 2017-07-22 DIAGNOSIS — I10 Essential (primary) hypertension: Secondary | ICD-10-CM

## 2017-07-22 DIAGNOSIS — Z0000 Encounter for general adult medical examination without abnormal findings: Secondary | ICD-10-CM

## 2017-07-22 LAB — POCT GLYCOSYLATED HEMOGLOBIN (HGB A1C): Hemoglobin A1C: 6.6 % — AB (ref 4.0–5.6)

## 2017-07-22 LAB — GLUCOSE, CAPILLARY: GLUCOSE-CAPILLARY: 149 mg/dL — AB (ref 65–99)

## 2017-07-22 MED ORDER — FUROSEMIDE 80 MG PO TABS: 80.0000 mg | ORAL_TABLET | Freq: Every day | ORAL | 3 refills | Status: DC

## 2017-07-22 MED ORDER — SITAGLIPTIN PHOSPHATE 50 MG PO TABS: 25.0000 mg | ORAL_TABLET | Freq: Every day | ORAL | Status: DC

## 2017-07-22 MED ORDER — INSULIN GLARGINE 100 UNIT/ML SOLOSTAR PEN: PEN_INJECTOR | SUBCUTANEOUS | 3 refills | Status: DC

## 2017-07-22 NOTE — Progress Notes (Addendum)
CC: Diabetes  HPI:  [name] is a 62 y.o. female with past medical history as listed below including CAD status post CABG x5 with mitral annuloplasty in 2007, ischemic cardiomyopathy with CHF status post CRT-D, type 2 diabetes, hypertension, CKD 3, PAD, and gout who presents for follow-up management of her diabetes.  Please see problem based on her status and her chronic medical issues.  Diabetes mellitus type 2, controlled ([place]) Last A1c was 7.5 in December 2018.  She is currently taking Lantus 33 units nightly and Januvia 50 mg daily.  She is only intermittently checking her blood sugars but does report feeling symptoms of hypoglycemia once with " shakiness."  Her blood sugar at that time was 77 which she corrected with juice. A/P: Repeat A1c this visit is 6.6.  Her diabetes is currently well controlled.  Given her report of symptomatic hypoglycemia with CBG of 77, will cut back a little on her basal insulin to 30 units.  We will also decrease her Januvia to 25 mg daily due to her renal function. -Lantus 30 units nightly -Januvia 25 mg daily -Monitor blood sugars at home, repeat A1c in 3 months -She reports a recent eye exam with Dr. Venetia Maxon; we will try to obtain those results  Chronic systolic heart failure ([place]) She has chronic systolic heart failure secondary to ischemic cardiomyopathy status post CRT-D.  Last EF was 25 to 30% by TTE on 11/09/2016.  She underwent right and left heart catheterization on 03/10/2017 which showed severe three-vessel disease, patent grafts, and significant mitral regurgitation.  She follows closely with cardiology (Dr. Caryl Comes), Heart Failure (Dr. Haroldine Laws), and Heart Transplant (Dr. Radene Knee).    Given her recent worsening renal function she was told to discontinue her spironolactone and decrease her Lasix to 80 mg once a day on her last cardiology office visit.  She says since that time she has felt more groggy and tired, but without any dyspnea or lower  extremity swelling.  A/P: Her weight is up to 190 pounds by her scale this visit, however she appears euvolemic on my exam without any lower extremity edema or rales on pulmonary exam.  She is currently being evaluated for potential heart and renal transplant at [facility].  I recommended that she continue her current medications follow-up with cardiology as scheduled. -Continue losartan 12.5 mg twice daily, continue digoxin 0.0625 mg every other day, continue Lasix 80 mg daily, continue metolazone 2.5 mg weekly  Hypertension associated with diabetes ([place]) Her hypertension is currently treated with her cardiac medications including losartan and Lasix.  A/P: BP Readings from Last 3 Encounters:  07/22/17 (!) 107/55  07/13/17 100/60  06/17/17 (!) 93/53   Blood pressure is currently controlled to borderline low.  She remains asymptomatic.  We will continue to monitor.   CKD (chronic kidney disease) stage 3, GFR 30-59 ml/min (HCC) She chronic kidney disease stage III which is now progressing to stage IV.  Her medications have recently been adjusted by discontinuing spironolactone and reducing her Lasix to 80 once daily from twice daily.  She has been evaluated by the renal transplant team at [facility] for potential heart/renal transplant and has been determined not to have any contraindications for renal transplant. A/P: Her CKD is progressing toward stage IV.  We will decrease her Januvia to renal dose 25 mg daily. She is scheduled for repeat lab work with cardiology within the next week so this was not repeated this visit.  She will follow-up with her  kidney specialists as scheduled.  Hypothyroidism due to amiodarone She was noted to have an elevated TSH of 10.57 on 07/13/2017.  She also reports recent feelings of fatigue and intermittent constipation.  A/P:  Her elevated TSH greater than 10 is concerning for development of overt hypothyroidism.  She does have a history of NSVT is on amiodarone which may  lead to thyroid dysfunction as a medication side effect.  Will send a message to Dr. Haroldine Laws to discuss whether she should continue on amiodarone.  Options at this time include discontinuing amiodarone pending cardiology input, starting low dose synthroid (rather than weight based dosing given CAD), or continue to monitor and repeat TSH.  ADDENDUM 07/24/17: We will obtain a free T4 to assess her thyroid function further before starting Levothyroxine after discussing with Dr. Haroldine Laws. She will remain on Amiodarone. If T4 is low, then I would recommend starting a low dose Levothyroxine 25 mcg daily given her coronary artery disease rather than weight based dosing. Can then repeat TSH after 6 weeks of treatment and adjust as necessary. I discussed the plan with Ms. Rougeau by phone today 07/24/17. Will place order for free T4. Will see if we can add on to her planned blood draw on 5/29, otherwise she can have this drawn in the Internal Medicine Clinic.  Healthcare maintenance Her last Pap smear was on 05/14/2016 which was negative for intraepithelial lesions or malignancy, however positive for HPV.  Further testing was negative for HPV 16/18/45 genotyping.  Recommendations were to repeat Pap smear with cotesting in 1 year for which she is due.  A/P:  Is due for repeat Pap smear with HPV cotesting.  She will follow-up with [place] in the next few weeks for repeat testing.    Past Medical History:  Diagnosis Date  . AICD (automatic cardioverter/defibrillator) present   . Chronic systolic heart failure ([place])    a. ICM b. ECHO (06/2013): EF 15%, severe MR (06/2013) c. RHC (10/2013): RA 5, RV 23/2/5, PA 25/6 (14), PCWP 12, Fick CO/CI: 3.2/1.7, PVR 0.7 WU, PA 45%, 47% and 55% (with levophed 5), vagal response during cath. d. On home milrinone.  . CKD (chronic kidney disease), stage III ([place])   . Coronary artery disease    a. s/p CABG x 5 with MV annuloplasty 2007   . Diabetes mellitus   . DVT (deep venous  thrombosis) ([place]) 2009  . Hypertension   . Implantable defibrillator   medtronic   . Ischemic cardiomyopathy    a. s/p ICD. b. LHC (10/04/13): 1. Severe native CAD with all bypass grafts patent. c. CRT upgrade 12/2013.  [name] LBBB (left bundle branch block)   . Lipoma   . PAD (peripheral artery disease) ([place])   . PICC line infection    a. 01/2014 - PICC exchanged.  . Polysubstance abuse ([place])    history of  (cocaine, tobacco and ETOH)  . Presence of permanent cardiac pacemaker   . V-tach [facility])    Review of Systems:   Review of Systems  Constitutional: Positive for malaise/fatigue. Negative for weight loss.  Respiratory: Negative for shortness of breath.   Cardiovascular: Negative for chest pain, palpitations, orthopnea and leg swelling.  Gastrointestinal:       Occasional constipation  Neurological: Negative for dizziness and loss of consciousness.     Physical Exam:  Vitals:   07/22/17 1556  BP: (!) 107/55  Pulse: 66  Temp: 98.1 F (36.7 C)  TempSrc: Oral  SpO2: 100%  Weight:  190 lb 6.4 oz (86.4 kg)  Height: 5\' 4"  (1.626 m)   Physical Exam  Constitutional: She is oriented to person, place, and time. She appears well-developed and well-nourished. No distress.  HENT:  Head: Normocephalic and atraumatic.  Cardiovascular: Normal rate and regular rhythm.  Murmur heard. Pulmonary/Chest: Effort normal. No respiratory distress. She has no wheezes. She has no rales.  Musculoskeletal: She exhibits no edema.  Neurological: She is alert and oriented to person, place, and time.  Skin: Skin is warm. She is not diaphoretic.  Psychiatric: She has a normal mood and affect.     Assessment & Plan:   See Encounters Tab for problem based charting.  Patient discussed with Dr. Dareen Piano

## 2017-07-22 NOTE — Patient Instructions (Signed)
It was a pleasure to see you again Ms. Inch.  Please decrease your Januvia to 25 mg (half tablet) once a day and your Lantus to 33 units every night.  I will talk with your heart doctors about the Amiodarone and if this needs to be adjusted due to your thyroid numbers.  Please follow up with me in about 3 weeks for your pap smear.

## 2017-07-23 NOTE — Assessment & Plan Note (Signed)
Her last Pap smear was on 05/14/2016 which was negative for intraepithelial lesions or malignancy, however positive for HPV.  Further testing was negative for HPV 16/18/45 genotyping.  Recommendations were to repeat Pap smear with cotesting in 1 year for which she is due.  A/P:  Is due for repeat Pap smear with HPV cotesting.  She will follow-up with [place] in the next few weeks for repeat testing.

## 2017-07-23 NOTE — Assessment & Plan Note (Addendum)
Last A1c was 7.5 in December 2018.  She is currently taking Lantus 33 units nightly and Januvia 50 mg daily.  She is only intermittently checking her blood sugars but does report feeling symptoms of hypoglycemia once with " shakiness."  Her blood sugar at that time was 77 which she corrected with juice. A/P: Repeat A1c this visit is 6.6.  Her diabetes is currently well controlled.  Given her report of symptomatic hypoglycemia with CBG of 77, will cut back a little on her basal insulin to 30 units.  We will also decrease her Januvia to 25 mg daily due to her renal function. -Lantus 30 units nightly -Januvia 25 mg daily -Monitor blood sugars at home, repeat A1c in 3 months -She reports a recent eye exam with Dr. Venetia Maxon; we will try to obtain those results

## 2017-07-23 NOTE — Assessment & Plan Note (Addendum)
She has chronic systolic heart failure secondary to ischemic cardiomyopathy status post CRT-D.  Last EF was 25 to 30% by TTE on 11/09/2016.  She underwent right and left heart catheterization on 03/10/2017 which showed severe three-vessel disease, patent grafts, and significant mitral regurgitation.  She follows closely with cardiology (Dr. Caryl Comes), Heart Failure (Dr. Haroldine Laws), and Heart Transplant (Dr. Radene Knee).    Given her recent worsening renal function she was told to discontinue her spironolactone and decrease her Lasix to 80 mg once a day on her last cardiology office visit.  She says since that time she has felt more groggy and tired, but without any dyspnea or lower extremity swelling.  A/P: Her weight is up to 190 pounds by her scale this visit, however she appears euvolemic on my exam without any lower extremity edema or rales on pulmonary exam.  She is currently being evaluated for potential heart and renal transplant at [facility].  I recommended that she continue her current medications follow-up with cardiology as scheduled. -Continue losartan 12.5 mg twice daily, continue digoxin 0.0625 mg every other day, continue Lasix 80 mg daily, continue metolazone 2.5 mg weekly

## 2017-07-23 NOTE — Assessment & Plan Note (Addendum)
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  Her medications have recently been adjusted by discontinuing spironolactone and reducing her Lasix to 80 once daily from twice daily.  She has been evaluated by the renal transplant team at [facility] for potential heart/renal transplant and has been determined not to have any contraindications for renal transplant. A/P: Her CKD is progressing toward stage IV.  We will decrease her Januvia to renal dose 25 mg daily. She is scheduled for repeat lab work with cardiology within the next week so this was not repeated this visit.  She will follow-up with her kidney specialists as scheduled.

## 2017-07-23 NOTE — Assessment & Plan Note (Addendum)
She was noted to have an elevated TSH of 10.57 on 07/13/2017.  She also reports recent feelings of fatigue and intermittent constipation.  A/P:  Her elevated TSH greater than 10 is concerning for development of overt hypothyroidism.  She does have a history of NSVT is on amiodarone which may lead to thyroid dysfunction as a medication side effect.  Will send a message to Dr. Haroldine Laws to discuss whether she should continue on amiodarone.  Options at this time include discontinuing amiodarone pending cardiology input, starting low dose synthroid (rather than weight based dosing given CAD), or continue to monitor and repeat TSH.  ADDENDUM 07/24/17: We will obtain a free T4 to assess her thyroid function further before starting Levothyroxine after discussing with Dr. Haroldine Laws. She will remain on Amiodarone. If T4 is low, then I would recommend starting a low dose Levothyroxine 25 mcg daily given her coronary artery disease rather than weight based dosing. Can then repeat TSH after 6 weeks of treatment and adjust as necessary. I discussed the plan with [name] by phone today 07/24/17. Will place order for free T4. Will see if we can add on to her planned blood draw on 5/29, otherwise she can have this drawn in the Internal Medicine Clinic.

## 2017-07-23 NOTE — Assessment & Plan Note (Signed)
Her hypertension is currently treated with her cardiac medications including losartan and Lasix.  A/P: BP Readings from Last 3 Encounters:  07/22/17 (!) 107/55  07/13/17 100/60  06/17/17 (!) 93/53   Blood pressure is currently controlled to borderline low.  She remains asymptomatic.  We will continue to monitor.

## 2017-07-24 ENCOUNTER — Other Ambulatory Visit: Payer: Self-pay | Admitting: Internal Medicine

## 2017-07-24 DIAGNOSIS — Z1231 Encounter for screening mammogram for malignant neoplasm of breast: Secondary | ICD-10-CM

## 2017-07-24 NOTE — Progress Notes (Signed)
Internal Medicine Clinic Attending  Case discussed with Dr. Patel at the time of the visit.  We reviewed the resident's history and exam and pertinent patient test results.  I agree with the assessment, diagnosis, and plan of care documented in the resident's note.  

## 2017-07-24 NOTE — Addendum Note (Signed)
Addended by: Lenore Cordia on: 07/24/2017 12:46 PM   Modules accepted: Orders

## 2017-07-29 ENCOUNTER — Ambulatory Visit (HOSPITAL_COMMUNITY)
Admission: RE | Admit: 2017-07-29 | Discharge: 2017-07-29 | Disposition: A | Discharge status: Home / Self Care | Payer: Medicare Other | Source: Ambulatory Visit | Attending: Internal Medicine | Admitting: Internal Medicine

## 2017-07-29 ENCOUNTER — Telehealth: Payer: Self-pay | Admitting: Internal Medicine

## 2017-07-29 DIAGNOSIS — I5022 Chronic systolic (congestive) heart failure: Secondary | ICD-10-CM | POA: Insufficient documentation

## 2017-07-29 DIAGNOSIS — E032 Hypothyroidism due to medicaments and other exogenous substances: Secondary | ICD-10-CM | POA: Diagnosis not present

## 2017-07-29 DIAGNOSIS — T462X1A Poisoning by other antidysrhythmic drugs, accidental (unintentional), initial encounter: Secondary | ICD-10-CM | POA: Insufficient documentation

## 2017-07-29 LAB — BASIC METABOLIC PANEL
Anion gap: 10 (ref 5–15)
BUN: 45 mg/dL — ABNORMAL HIGH (ref 6–20)
CALCIUM: 9.2 mg/dL (ref 8.9–10.3)
CO2: 26 mmol/L (ref 22–32)
CREATININE: 1.84 mg/dL — AB (ref 0.44–1.00)
Chloride: 102 mmol/L (ref 101–111)
GFR calc Af Amer: 33 mL/min — ABNORMAL LOW (ref >60)
GFR calc non Af Amer: 29 mL/min — ABNORMAL LOW (ref >60)
GLUCOSE: 155 mg/dL — AB (ref 65–99)
Potassium: 4 mmol/L (ref 3.5–5.1)
Sodium: 138 mmol/L (ref 135–145)

## 2017-07-29 LAB — T4, FREE: Free T4: 0.75 ng/dL — ABNORMAL LOW (ref 0.82–1.77)

## 2017-07-29 MED ORDER — LEVOTHYROXINE SODIUM 25 MCG PO TABS: 25.0000 ug | ORAL_TABLET | Freq: Every day | ORAL | 0 refills | Status: DC

## 2017-07-29 NOTE — Telephone Encounter (Signed)
Low T4, reivewed Dr Serita Grit note, had elevated TSH discussed with Dr Jeffie Pollock plan was for low dose synthroid if T4 low. Prescribed Synthroid 57mcg daily.  I called and discussed this with patient including instructions for dosing. She has follow up with Dr Posey Pronto scheduled.

## 2017-08-03 ENCOUNTER — Encounter (HOSPITAL_COMMUNITY): Payer: Self-pay

## 2017-08-03 ENCOUNTER — Ambulatory Visit (HOSPITAL_COMMUNITY)
Admission: RE | Admit: 2017-08-03 | Discharge: 2017-08-03 | Disposition: A | Discharge status: Home / Self Care | Payer: Medicare Other | Source: Ambulatory Visit | Attending: Internal Medicine | Admitting: Internal Medicine

## 2017-08-03 VITALS — BP 116/74 | HR 77 | Wt 189.6 lb

## 2017-08-03 DIAGNOSIS — Z951 Presence of aortocoronary bypass graft: Secondary | ICD-10-CM | POA: Diagnosis not present

## 2017-08-03 DIAGNOSIS — I472 Ventricular tachycardia, unspecified: Secondary | ICD-10-CM

## 2017-08-03 DIAGNOSIS — Z7982 Long term (current) use of aspirin: Secondary | ICD-10-CM | POA: Insufficient documentation

## 2017-08-03 DIAGNOSIS — I2510 Atherosclerotic heart disease of native coronary artery without angina pectoris: Secondary | ICD-10-CM | POA: Diagnosis not present

## 2017-08-03 DIAGNOSIS — I5022 Chronic systolic (congestive) heart failure: Secondary | ICD-10-CM | POA: Insufficient documentation

## 2017-08-03 DIAGNOSIS — K635 Polyp of colon: Secondary | ICD-10-CM | POA: Diagnosis not present

## 2017-08-03 DIAGNOSIS — E1151 Type 2 diabetes mellitus with diabetic peripheral angiopathy without gangrene: Secondary | ICD-10-CM | POA: Diagnosis not present

## 2017-08-03 DIAGNOSIS — E032 Hypothyroidism due to medicaments and other exogenous substances: Secondary | ICD-10-CM | POA: Diagnosis not present

## 2017-08-03 DIAGNOSIS — I340 Nonrheumatic mitral (valve) insufficiency: Secondary | ICD-10-CM | POA: Diagnosis not present

## 2017-08-03 DIAGNOSIS — Z8249 Family history of ischemic heart disease and other diseases of the circulatory system: Secondary | ICD-10-CM | POA: Insufficient documentation

## 2017-08-03 DIAGNOSIS — N183 Chronic kidney disease, stage 3 (moderate): Secondary | ICD-10-CM | POA: Insufficient documentation

## 2017-08-03 DIAGNOSIS — I428 Other cardiomyopathies: Secondary | ICD-10-CM

## 2017-08-03 DIAGNOSIS — T462X1A Poisoning by other antidysrhythmic drugs, accidental (unintentional), initial encounter: Secondary | ICD-10-CM

## 2017-08-03 DIAGNOSIS — I252 Old myocardial infarction: Secondary | ICD-10-CM | POA: Insufficient documentation

## 2017-08-03 DIAGNOSIS — E039 Hypothyroidism, unspecified: Secondary | ICD-10-CM | POA: Diagnosis not present

## 2017-08-03 DIAGNOSIS — Z794 Long term (current) use of insulin: Secondary | ICD-10-CM | POA: Diagnosis not present

## 2017-08-03 DIAGNOSIS — Z6832 Body mass index (BMI) 32.0-32.9, adult: Secondary | ICD-10-CM | POA: Diagnosis not present

## 2017-08-03 DIAGNOSIS — Z950 Presence of cardiac pacemaker: Secondary | ICD-10-CM | POA: Insufficient documentation

## 2017-08-03 DIAGNOSIS — Z95810 Presence of automatic (implantable) cardiac defibrillator: Secondary | ICD-10-CM | POA: Diagnosis not present

## 2017-08-03 DIAGNOSIS — E669 Obesity, unspecified: Secondary | ICD-10-CM | POA: Insufficient documentation

## 2017-08-03 DIAGNOSIS — E1122 Type 2 diabetes mellitus with diabetic chronic kidney disease: Secondary | ICD-10-CM | POA: Insufficient documentation

## 2017-08-03 DIAGNOSIS — Z79890 Hormone replacement therapy (postmenopausal): Secondary | ICD-10-CM | POA: Insufficient documentation

## 2017-08-03 DIAGNOSIS — I255 Ischemic cardiomyopathy: Secondary | ICD-10-CM | POA: Insufficient documentation

## 2017-08-03 DIAGNOSIS — I130 Hypertensive heart and chronic kidney disease with heart failure and stage 1 through stage 4 chronic kidney disease, or unspecified chronic kidney disease: Secondary | ICD-10-CM | POA: Diagnosis not present

## 2017-08-03 DIAGNOSIS — I4729 Other ventricular tachycardia: Secondary | ICD-10-CM

## 2017-08-03 MED ORDER — FUROSEMIDE 80 MG PO TABS: ORAL_TABLET | ORAL | 3 refills | Status: DC

## 2017-08-03 NOTE — Patient Instructions (Signed)
INCREASE Lasix to 80 mg (1 Tablet) in the AM and 40 mg (0.5 Tablet) in the PM.  Labs in 1 week (bmet)  Follow up with Dr. Haroldine Laws in 3 months.

## 2017-08-03 NOTE — Progress Notes (Signed)
Patient ID: [name], female   DOB: 10/17/1955, 62 y.o.   MRN: [id number]    Advanced Heart Failure Clinic Note  PCP: Dr. Posey Pronto  Primary HF:  Dr. Haroldine Laws   HPI: 62 y.o. female with history of HTN, DM2, past polysubstance abuse (ETOH, tobacco, cocaine), CAD s/p CABG x5 with MV annuloplasty (2007), ischemic cardiomyopathy s/p Medtronic CRT-D, chronic systolic HF EF 85-88%, CKD stage III and PAD. Blood type O+.  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 At time of cath LAD, LCX and RCA occluded. EF 45%. Had PCI of LAD followed by abrupt stent occlusion. Had repeat PCI of LAD and then underwent CABG x 5 with mitral valve annuloplasty in 2007.   Amitted 8/2-10/07/13 for syncope s/p ICD shock. Concern that VT was possibly from ischemia and taken for [facility]. Started on Amiodarone. Cardiac output dropped while in hospital so started on milrinone. Discharged home. Underwent CRT-D upgrade on 12/02/13  In 12/15, she was admitted with catheter infection. She was admitted and catheter was replaced.  Milrinone titrated off in January 2016 and has done reasonably well.   Currently undergoing heart-kidney transplant eval with Dr. Radene Knee at [facility]. Had cath 03/10/17 as part of process.   She returns for HF follow up. Since the last visit, she had lab work that showed elevated creatinine. PCP stopped spironolactone and cut back her lasix to once a day. Overall feeling fine. Denies PND/Orthopnea. She says on Fridays she has increased dyspnea before she takes the weekly metolazone. Appetite ok. No fever or chills. Weight at home 185-190 pounds. Taking all medications. Lives at home alone.    [facility] 03/10/2017  Ao = 99/59 (75) LV = 104/22 RA = 11 RV = 57/13 PA = 61/28 (39) PCW = 24 (v = 35-40) Fick cardiac output/index = 5.5/2.9 Thermo CO/CI = 6.9/3.7 PVR = 2.2 WU FA sat = 96% PA sat = 65%, 64% Assessment: 1. Severe 3v CAD 2. All 5 grafts open 3.  Moderately elevated biventricular pressures with normal cardiac output. Prominent v-waves in PCW tracing c/w with significant MR  Echos: 05/19/2012  EF 15% 2/15 EF 15%, s/p MV repair with moderate-severe MR.  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 Lives in [place] with 2 sons([name] and [name]).   ROS: All systems negative except as listed in HPI, PMH and Problem List.  Past Medical History:  Diagnosis Date  . AICD (automatic cardioverter/defibrillator) present   . Chronic systolic heart failure ([place])    a. ICM b. ECHO (06/2013): EF 15%, severe MR (06/2013) c. RHC (10/2013): RA 5, RV 23/2/5, PA 25/6 (14), PCWP 12, Fick CO/CI: 3.2/1.7, PVR 0.7 WU, PA 45%, 47% and 55% (with levophed 5), vagal response during cath. d. On home milrinone.  . CKD (chronic kidney disease), stage III ([place])   . Coronary artery disease    a. s/p CABG x 5 with MV annuloplasty 2007   .  Diabetes mellitus   . DVT (deep venous thrombosis) ([place]) 2009  . Hypertension   . Implantable defibrillator   medtronic   . Ischemic cardiomyopathy    a. s/p ICD. b. LHC (10/04/13): 1. Severe native CAD with all bypass grafts patent. c. CRT upgrade 12/2013.  [name] LBBB (left bundle branch block)   . Lipoma   . PAD (peripheral artery disease) ([place])   . PICC line infection    a. 01/2014 - PICC exchanged.  . Polysubstance abuse ([place])    history of  (cocaine, tobacco and ETOH)  . Presence of permanent cardiac pacemaker   . V-tach [facility])     Current Outpatient Medications  Medication Sig  Dispense Refill  . ACCU-CHEK FASTCLIX LANCETS MISC Use to test blood glucose 2 times daily. Dx:E11.9 102 each 12  . acetaminophen (TYLENOL) 500 MG tablet Take 500-1,000 mg by mouth every 6 (six) hours as needed for moderate pain or headache.     . allopurinol (ZYLOPRIM) 100 MG tablet TAKE 2 TABLETS BY MOUTH ONCE DAILY AT BEDTIME (Patient taking differently: Take 200 mg by mouth at bedtime. ) 180 tablet 3  . amiodarone (PACERONE) 200 MG tablet Take 0.5 tablets (100 mg total) by mouth daily. 15 tablet 3  . aspirin EC 81 MG tablet Take 81 mg by mouth every morning.     [name] atorvastatin (LIPITOR) 80 MG tablet TAKE 1 TABLET BY MOUTH ONCE DAILY (Patient taking differently: TAKE 80 MG BY MOUTH ONCE DAILY) 90 tablet 3  . Blood Glucose Monitoring Suppl (ACCU-CHEK NANO SMARTVIEW) W/DEVICE KIT Use to test blood glucose 2 times daily. Dx:E11.9 1 kit 0  . Cholecalciferol (VITAMIN D PO) Take 5,000 Units by mouth daily.     . diclofenac sodium (VOLTAREN) 1 % GEL Apply 4 g topically 4 (four) times daily. (Patient taking differently: Apply 4 g topically 4 (four) times daily as needed (for pain.). ) 1 Tube 2  . digoxin (LANOXIN) 0.125 MG tablet TAKE 1/2 (ONE-HALF) TABLET BY MOUTH EVERY OTHER DAY (Patient taking differently: TAKE 0.0625 MG TABLET BY MOUTH EVERY OTHER DAY) 15 tablet 3  . furosemide (LASIX) 80 MG tablet Take 1 tablet (80 mg total) by mouth daily. 30 tablet 3  . glucose blood (ACCU-CHEK SMARTVIEW) test strip Use to test blood glucose 4 times daily (before meals and at bedtime). Dx:E11.9. INSULIN DEPENDENT 100 each 2  . Insulin Glargine (LANTUS SOLOSTAR) 100 UNIT/ML Solostar Pen Inject 30 units into the skin at bedtime.Diagnosis code:E11.8 15 pen 3  . Insulin Pen Needle (B-D ULTRAFINE III SHORT PEN) 31G X 8 MM MISC USE TO INJECT INSULIN THREE TIMES DAILY 100 each 5  . levothyroxine (SYNTHROID, LEVOTHROID) 25 MCG tablet Take 1 tablet (25 mcg total) by mouth daily before breakfast. 90 tablet 0  . losartan  (COZAAR) 25 MG tablet TAKE ONE-HALF TABLET BY MOUTH TWICE DAILY 60 tablet 4  . metolazone (ZAROXOLYN) 2.5 MG tablet Take 1 tablet (2.5 mg total) by mouth once a week. May take an additional tablet ONLY as directed by the CHF clinic. 15 tablet 3  . nitroGLYCERIN (NITROSTAT) 0.4 MG SL tablet Place 1 tablet (0.4 mg total) under the tongue every 5 (five) minutes as needed. For chest pain. (Patient taking differently: Place 0.4 mg under the tongue every 5 (five) minutes as needed for chest pain. ) 25 tablet 11  . potassium chloride SA (K-DUR,KLOR-CON) 20 MEQ tablet Take 1 tablet (20 mEq total) by mouth daily. 90 tablet 3  .  sitaGLIPtin (JANUVIA) 50 MG tablet Take 0.5 tablets (25 mg total) by mouth daily.     No current facility-administered medications for this encounter.     Vitals:   08/03/17 1332  BP: 116/74  Pulse: 77  SpO2: 96%  Weight: 189 lb 9.6 oz (86 kg)    Wt Readings from Last 3 Encounters:  08/03/17 189 lb 9.6 oz (86 kg)  07/22/17 190 lb 6.4 oz (86.4 kg)  07/13/17 186 lb (84.4 kg)     PHYSICAL EXAM: General:  Well appearing. No resp difficulty HEENT: normal Neck: supple. JVP 9-10. Carotids 2+ bilat; no bruits. No lymphadenopathy or thryomegaly appreciated. Cor: PMI nondisplaced. Regular rate & rhythm. No rubs, gallops or murmurs. Sternal scar  Lungs: clear Abdomen: soft, nontender, nondistended. No hepatosplenomegaly. No bruits or masses. Good bowel sounds. Extremities: no cyanosis, clubbing, rash, edema Neuro: alert & orientedx3, cranial nerves grossly intact. moves all 4 extremities w/o difficulty. Affect pleasant   ASSESSMENT & PLAN:  1. Chronic systolic CHF: Ischemic cardiomyopathy s/p Medtronic CRT-D, EF 20-25% ( Echo 04/2015).  - She follows in the transplant clinic at [facility] q 6 months with Dr. Radene Knee. Being worked up for heart-kidney transplant - She is blood type O - Echo 9/18 EF 25% RV ok  NYHA II-III. Volume status trending up. Continue lasix 80 mg in am and add 40  mg in pm. Check BMET next week.  - Intolerant entresto and carvedilol due to hypotension. Tried multiple times.  [name] stopped with worsening renal function.  - Continue digoxin 0.0625 mg daily.  - ICD interrogated personally in clinic. Trending up. No VT. Activity ~4 hours per day.   : 2. CAD:  - Stable on recent cath 1/19 -No S/S ischemai  Continue ASA/statin  3. Mitral regurgitation:  -s/p mitral valve annuloplasty with CABG in 2007. Moderate to severe MR on echo in 4/15. TEE (06/2013) mitral regurgitation appeared severe, anatomy of repaired valve does not look suitable for MV clipping and would be high risk for MV replacement.  - mild to moderate MR on recent echo 9/18  4. CKD stage III:  I reviewed recent BMET.  - Watch renal function carefully with change in diuretics - Has been referred to [facility]  5.  NSVT:  - Continue amiodarone 100 mg.daily  TSH elelvated on 5/13. Started throid medications. -  -No VT on device interrogation.  6. Obesity - Much improved. Body mass index is 32.54 kg/m.   7. PAD:  - Symptoms stable - Right mid SFA stenosis per [place] 08/30/14.    8. Colon polyps - Had colonoscopy 06/08/14 with 12 polyps.  - Repeat colonoscopy pending  9. Hypothyroidism TSH elevated 07/13/2017. PCP start synthroid. Likely related to amiodarone.   Follow up in 3 months with Dr Donnamae Jude, NP  08/03/17

## 2017-08-10 ENCOUNTER — Ambulatory Visit (HOSPITAL_COMMUNITY)
Admission: RE | Admit: 2017-08-10 | Discharge: 2017-08-10 | Disposition: A | Discharge status: Home / Self Care | Payer: Medicare Other | Source: Ambulatory Visit | Attending: Internal Medicine | Admitting: Internal Medicine

## 2017-08-10 DIAGNOSIS — I5022 Chronic systolic (congestive) heart failure: Secondary | ICD-10-CM | POA: Insufficient documentation

## 2017-08-10 LAB — BASIC METABOLIC PANEL
Anion gap: 12 (ref 5–15)
BUN: 44 mg/dL — ABNORMAL HIGH (ref 6–20)
CO2: 29 mmol/L (ref 22–32)
Calcium: 9.8 mg/dL (ref 8.9–10.3)
Chloride: 100 mmol/L — ABNORMAL LOW (ref 101–111)
Creatinine, Ser: 2.26 mg/dL — ABNORMAL HIGH (ref 0.44–1.00)
GFR calc Af Amer: 26 mL/min — ABNORMAL LOW (ref >60)
GFR, EST NON AFRICAN AMERICAN: 22 mL/min — AB (ref >60)
Glucose, Bld: 183 mg/dL — ABNORMAL HIGH (ref 65–99)
POTASSIUM: 3.4 mmol/L — AB (ref 3.5–5.1)
SODIUM: 141 mmol/L (ref 135–145)

## 2017-08-10 NOTE — Progress Notes (Signed)
Patient requested assistance with multiple medical bills and medicaid. Patient states she had medicaid and recently became medicare eligible and now her check is reduced for the premiums. Patient also noted that she has not yet signed up for Medicare D. CSW discussed SHIP program to gain some insurance counseling for Medicare D and possible assistance for Medicare premiums through the extra help program. CSW also suggested that patient explore possible deductible medicaid application at [place]. Patient verbalizes understanding and will follow up. CSW available as needed. [name], [place], [place]

## 2017-08-10 NOTE — Addendum Note (Signed)
Encounter addended by: Louann Liv, LCSW on: 08/10/2017 2:38 PM  Actions taken: Sign clinical note

## 2017-08-12 DIAGNOSIS — I2510 Atherosclerotic heart disease of native coronary artery without angina pectoris: Secondary | ICD-10-CM | POA: Diagnosis not present

## 2017-08-12 DIAGNOSIS — E1122 Type 2 diabetes mellitus with diabetic chronic kidney disease: Secondary | ICD-10-CM | POA: Diagnosis not present

## 2017-08-12 DIAGNOSIS — I129 Hypertensive chronic kidney disease with stage 1 through stage 4 chronic kidney disease, or unspecified chronic kidney disease: Secondary | ICD-10-CM | POA: Diagnosis not present

## 2017-08-12 DIAGNOSIS — N390 Urinary tract infection, site not specified: Secondary | ICD-10-CM | POA: Diagnosis not present

## 2017-08-12 DIAGNOSIS — Z01818 Encounter for other preprocedural examination: Secondary | ICD-10-CM | POA: Diagnosis not present

## 2017-08-12 DIAGNOSIS — I429 Cardiomyopathy, unspecified: Secondary | ICD-10-CM | POA: Diagnosis not present

## 2017-08-12 DIAGNOSIS — N184 Chronic kidney disease, stage 4 (severe): Secondary | ICD-10-CM | POA: Diagnosis not present

## 2017-08-12 DIAGNOSIS — I5022 Chronic systolic (congestive) heart failure: Secondary | ICD-10-CM | POA: Diagnosis not present

## 2017-08-12 DIAGNOSIS — N183 Chronic kidney disease, stage 3 (moderate): Secondary | ICD-10-CM | POA: Diagnosis not present

## 2017-08-13 ENCOUNTER — Other Ambulatory Visit: Payer: Self-pay | Admitting: Nephrology

## 2017-08-13 DIAGNOSIS — N184 Chronic kidney disease, stage 4 (severe): Secondary | ICD-10-CM

## 2017-08-14 ENCOUNTER — Telehealth (HOSPITAL_COMMUNITY): Payer: Self-pay

## 2017-08-14 ENCOUNTER — Ambulatory Visit
Admission: RE | Admit: 2017-08-14 | Discharge: 2017-08-14 | Disposition: A | Discharge status: Home / Self Care | Payer: Medicare Other | Source: Ambulatory Visit | Attending: Internal Medicine | Admitting: Internal Medicine

## 2017-08-14 DIAGNOSIS — Z1231 Encounter for screening mammogram for malignant neoplasm of breast: Secondary | ICD-10-CM | POA: Diagnosis not present

## 2017-08-14 DIAGNOSIS — I5022 Chronic systolic (congestive) heart failure: Secondary | ICD-10-CM

## 2017-08-14 MED ORDER — FUROSEMIDE 80 MG PO TABS: 80.0000 mg | ORAL_TABLET | Freq: Every day | ORAL | 3 refills | Status: DC

## 2017-08-14 NOTE — Telephone Encounter (Signed)
Result Notes for Basic metabolic panel   Notes recorded by Effie Berkshire, RN on 08/14/2017 at 2:09 PM EDT Patient aware and agreeable ------  Notes recorded by Conrad [place], NP on 08/14/2017 at 12:29 PM EDT Please call ------  Notes recorded by Darrick Grinder D, NP on 08/13/2017 at 4:14 PM EDT Please call and cut back lasix to 80 mg daily. Stop afternoon lasix.

## 2017-08-19 ENCOUNTER — Encounter: Payer: Self-pay | Admitting: Internal Medicine

## 2017-08-19 ENCOUNTER — Other Ambulatory Visit (HOSPITAL_COMMUNITY)
Admission: RE | Admit: 2017-08-19 | Discharge: 2017-08-19 | Disposition: A | Discharge status: Home / Self Care | Payer: Medicare Other | Source: Ambulatory Visit | Attending: Internal Medicine | Admitting: Internal Medicine

## 2017-08-19 ENCOUNTER — Ambulatory Visit (INDEPENDENT_AMBULATORY_CARE_PROVIDER_SITE_OTHER): Payer: Medicare Other | Admitting: Internal Medicine

## 2017-08-19 DIAGNOSIS — I2510 Atherosclerotic heart disease of native coronary artery without angina pectoris: Secondary | ICD-10-CM

## 2017-08-19 DIAGNOSIS — R8789 Other abnormal findings in specimens from female genital organs: Secondary | ICD-10-CM | POA: Diagnosis not present

## 2017-08-19 DIAGNOSIS — R87618 Other abnormal cytological findings on specimens from cervix uteri: Secondary | ICD-10-CM

## 2017-08-19 NOTE — Progress Notes (Signed)
CC: Pap smear  HPI:  [name] is a 62 y.o. 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  Please see problem based charting for status of patient's chronic medical issues.  Pap smear abnormality of cervix/human papillomavirus (HPV) positive Patient's last Pap smear on 05/14/2016 was negative for intraepithelial lesions or malignancy, however positive for HPV.  Further testing was negative for HPV 16/18/45 genotyping.  Recommendations were to repeat Pap smear with cotesting in 1 year for which she is presenting today.  She denies any vaginal discharge, pruritus, rash, or dysuria. A/P: Repeat Pap smear performed in clinic today.  There is no observed external vaginal lesions or discharge.  Bimanual exam and cervical motion tenderness were negative.  Cervix was directly observed.  If Pap smear result is negative for intraepithelial lesions or malignancy and negative for HPV then we will repeat Pap smear again in 3 years.  If it is positive for either of these then she will need to be referred for colposcopy.  Discussed these options with the patient.    Past Medical History:  Diagnosis Date  . AICD (automatic cardioverter/defibrillator) present   . Chronic systolic heart failure ([place])    a. ICM b. ECHO (06/2013): EF 15%, severe MR (06/2013) c. RHC (10/2013): RA 5, RV 23/2/5, PA 25/6 (14), PCWP 12, Fick CO/CI: 3.2/1.7, PVR 0.7 WU, PA 45%, 47% and 55% (with levophed 5), vagal response during cath. d. On home milrinone.  . CKD (chronic kidney disease), stage III ([place])   . Coronary artery disease    a. s/p CABG x 5 with MV annuloplasty 2007   . Diabetes mellitus   . DVT (deep venous thrombosis) ([place])  2009  . Hypertension   . Implantable defibrillator   medtronic   . Ischemic cardiomyopathy    a. s/p ICD. b. LHC (10/04/13): 1. Severe native CAD with all bypass grafts patent. c. CRT upgrade 12/2013.  [name] LBBB (left bundle branch block)   . Lipoma   . PAD (peripheral artery disease) ([place])   . PICC line infection    a. 01/2014 - PICC exchanged.  . Polysubstance abuse ([place])    history of  (cocaine, tobacco and ETOH)  . Presence of permanent cardiac pacemaker   . V-tach [facility])    Review of Systems:   Review of Systems  Respiratory: Negative for shortness of breath.   Cardiovascular: Negative for chest pain.  Genitourinary: Negative for dysuria, flank pain and hematuria.     Physical Exam:  Vitals:   08/19/17 1443  BP: (!) 100/49  Pulse: 72  Temp: 98.9 F (37.2 C)  TempSrc: Oral  SpO2: 96%  Weight: 189 lb 11.2 oz (86 kg)  Height: 5\' 4"  (1.626 m)   Physical Exam  Constitutional: She is oriented to person, place, and time.  Cardiovascular: Normal rate and regular rhythm.  Pulmonary/Chest: Effort normal. No respiratory distress. She has no wheezes. She has no rales.  Genitourinary: Pelvic exam was performed with patient prone. There is no rash, tenderness or lesion on the right labia. There is no rash, tenderness or lesion on the left labia. Cervix exhibits no motion tenderness. Right adnexum displays no tenderness. Left adnexum displays no tenderness. No tenderness  or bleeding in the vagina. No vaginal discharge found.  Neurological: She is alert and oriented to person, place, and time.     Assessment & Plan:   See Encounters Tab for problem based charting.  Patient discussed with Dr. Daryll Drown

## 2017-08-19 NOTE — Assessment & Plan Note (Addendum)
Patient's last Pap smear on 05/14/2016 was negative for intraepithelial lesions or malignancy, however positive for HPV.  Further testing was negative for HPV 16/18/45 genotyping.  Recommendations were to repeat Pap smear with cotesting in 1 year for which she is presenting today.  She denies any vaginal discharge, pruritus, rash, or dysuria. A/P: Repeat Pap smear performed in clinic today.  There is no observed external vaginal lesions or discharge.  Bimanual exam and cervical motion tenderness were negative.  Cervix was directly observed.  If Pap smear result is negative for intraepithelial lesions or malignancy and negative for HPV then we will repeat Pap smear again in 3 years.  If it is positive for either of these then she will need to be referred for colposcopy.  Discussed these options with the patient.  ADDENDUM 08/24/17: Pap Smear Cytology negative for intraepithelial lesions or malignancy but positive for HPV. Last Pap Smear in 2018 had similar results. Will refer to Gynecology for potential colposcopy for further evaluation. I discussed the results and plan with patient by phone who is understanding and in agreement.

## 2017-08-19 NOTE — Patient Instructions (Signed)
It was a pleasure to see you again [name].  I will let you know the results of the pap smear and next steps based on the results.  Please see [place] again in 2 months to recheck your Hemoglobin A1c or sooner if needed.

## 2017-08-24 ENCOUNTER — Ambulatory Visit
Admission: RE | Admit: 2017-08-24 | Discharge: 2017-08-24 | Disposition: A | Discharge status: Home / Self Care | Payer: Medicare Other | Source: Ambulatory Visit | Attending: Nephrology | Admitting: Nephrology

## 2017-08-24 DIAGNOSIS — N184 Chronic kidney disease, stage 4 (severe): Secondary | ICD-10-CM | POA: Diagnosis not present

## 2017-08-24 LAB — CYTOLOGY - PAP
DIAGNOSIS: NEGATIVE
HPV: DETECTED — AB

## 2017-08-24 NOTE — Addendum Note (Signed)
Addended by: Zada Finders R on: 08/24/2017 01:02 PM   Modules accepted: Orders

## 2017-08-26 ENCOUNTER — Telehealth: Payer: Self-pay | Admitting: Dietician

## 2017-08-26 DIAGNOSIS — E119 Type 2 diabetes mellitus without complications: Secondary | ICD-10-CM

## 2017-08-26 MED ORDER — ACCU-CHEK FASTCLIX LANCETS MISC: 5 refills | Status: DC

## 2017-08-26 MED ORDER — INSULIN PEN NEEDLE 32G X 4 MM MISC: 3 refills | Status: DC

## 2017-08-26 NOTE — Telephone Encounter (Signed)
Asked to assist patient with getting her test strips to check her blood sugar.  Per [facility] pharmacy she can get 100 strips for $3.33 . Also request lancets and shorter pen needle prescriptions. Patient notified.

## 2017-08-26 NOTE — Progress Notes (Signed)
Internal Medicine Clinic Attending  Case discussed with Dr. Patel at the time of the visit.  We reviewed the resident's history and exam and pertinent patient test results.  I agree with the assessment, diagnosis, and plan of care documented in the resident's note.  

## 2017-09-01 ENCOUNTER — Other Ambulatory Visit: Payer: Self-pay | Admitting: Internal Medicine

## 2017-09-22 ENCOUNTER — Encounter: Payer: Self-pay | Admitting: *Deleted

## 2017-09-28 DIAGNOSIS — R87810 Cervical high risk human papillomavirus (HPV) DNA test positive: Secondary | ICD-10-CM | POA: Diagnosis not present

## 2017-09-28 DIAGNOSIS — N72 Inflammatory disease of cervix uteri: Secondary | ICD-10-CM | POA: Diagnosis not present

## 2017-10-12 ENCOUNTER — Ambulatory Visit (INDEPENDENT_AMBULATORY_CARE_PROVIDER_SITE_OTHER): Payer: Medicare Other | Admitting: *Deleted

## 2017-10-12 DIAGNOSIS — I472 Ventricular tachycardia, unspecified: Secondary | ICD-10-CM

## 2017-10-12 DIAGNOSIS — I5022 Chronic systolic (congestive) heart failure: Secondary | ICD-10-CM | POA: Diagnosis not present

## 2017-10-12 DIAGNOSIS — I428 Other cardiomyopathies: Secondary | ICD-10-CM

## 2017-10-12 NOTE — Progress Notes (Signed)
Remote ICD transmission.   

## 2017-10-13 ENCOUNTER — Encounter: Payer: Self-pay | Admitting: Cardiology

## 2017-10-14 ENCOUNTER — Other Ambulatory Visit: Payer: Self-pay | Admitting: Internal Medicine

## 2017-10-14 DIAGNOSIS — E118 Type 2 diabetes mellitus with unspecified complications: Secondary | ICD-10-CM

## 2017-10-14 NOTE — Telephone Encounter (Signed)
Rx refilled. Please schedule PCP appointment within 3 months to follow-up changes in diabetic meds.

## 2017-10-23 ENCOUNTER — Other Ambulatory Visit (HOSPITAL_COMMUNITY): Payer: Self-pay

## 2017-10-23 MED ORDER — METOLAZONE 2.5 MG PO TABS: 2.5000 mg | ORAL_TABLET | ORAL | 3 refills | Status: DC

## 2017-10-26 ENCOUNTER — Telehealth: Payer: Self-pay

## 2017-10-26 NOTE — Telephone Encounter (Signed)
Spoke w/ pt and informed pt that her device has reached ERI. Informed pt I will send a message to scheduler, and the scheduler will be in touch with her to schedule and appt w/ MD / PA / NP. Pt verbalized understanding.

## 2017-10-26 NOTE — Telephone Encounter (Signed)
LVM for pt to call device clinic back, PPM @ ERI pt needs apt with SK to discuss gen change.

## 2017-11-03 ENCOUNTER — Telehealth: Payer: Self-pay

## 2017-11-03 DIAGNOSIS — Z951 Presence of aortocoronary bypass graft: Secondary | ICD-10-CM | POA: Diagnosis not present

## 2017-11-03 DIAGNOSIS — I2722 Pulmonary hypertension due to left heart disease: Secondary | ICD-10-CM | POA: Diagnosis not present

## 2017-11-03 DIAGNOSIS — Z95810 Presence of automatic (implantable) cardiac defibrillator: Secondary | ICD-10-CM | POA: Diagnosis not present

## 2017-11-03 DIAGNOSIS — Z86718 Personal history of other venous thrombosis and embolism: Secondary | ICD-10-CM | POA: Diagnosis not present

## 2017-11-03 DIAGNOSIS — I472 Ventricular tachycardia: Secondary | ICD-10-CM | POA: Diagnosis not present

## 2017-11-03 DIAGNOSIS — Z09 Encounter for follow-up examination after completed treatment for conditions other than malignant neoplasm: Secondary | ICD-10-CM | POA: Diagnosis not present

## 2017-11-03 DIAGNOSIS — Z6832 Body mass index (BMI) 32.0-32.9, adult: Secondary | ICD-10-CM | POA: Diagnosis not present

## 2017-11-03 DIAGNOSIS — I447 Left bundle-branch block, unspecified: Secondary | ICD-10-CM | POA: Diagnosis not present

## 2017-11-03 DIAGNOSIS — Z794 Long term (current) use of insulin: Secondary | ICD-10-CM | POA: Diagnosis not present

## 2017-11-03 DIAGNOSIS — I5022 Chronic systolic (congestive) heart failure: Secondary | ICD-10-CM | POA: Diagnosis not present

## 2017-11-03 DIAGNOSIS — N183 Chronic kidney disease, stage 3 (moderate): Secondary | ICD-10-CM | POA: Diagnosis not present

## 2017-11-03 DIAGNOSIS — R634 Abnormal weight loss: Secondary | ICD-10-CM | POA: Diagnosis not present

## 2017-11-03 DIAGNOSIS — I340 Nonrheumatic mitral (valve) insufficiency: Secondary | ICD-10-CM | POA: Diagnosis not present

## 2017-11-03 DIAGNOSIS — R944 Abnormal results of kidney function studies: Secondary | ICD-10-CM | POA: Diagnosis not present

## 2017-11-03 DIAGNOSIS — I2510 Atherosclerotic heart disease of native coronary artery without angina pectoris: Secondary | ICD-10-CM | POA: Diagnosis not present

## 2017-11-03 DIAGNOSIS — F17210 Nicotine dependence, cigarettes, uncomplicated: Secondary | ICD-10-CM | POA: Diagnosis not present

## 2017-11-03 DIAGNOSIS — R5383 Other fatigue: Secondary | ICD-10-CM | POA: Diagnosis not present

## 2017-11-03 DIAGNOSIS — E119 Type 2 diabetes mellitus without complications: Secondary | ICD-10-CM | POA: Diagnosis not present

## 2017-11-03 DIAGNOSIS — I255 Ischemic cardiomyopathy: Secondary | ICD-10-CM | POA: Diagnosis not present

## 2017-11-03 DIAGNOSIS — E1122 Type 2 diabetes mellitus with diabetic chronic kidney disease: Secondary | ICD-10-CM | POA: Diagnosis not present

## 2017-11-03 DIAGNOSIS — E1151 Type 2 diabetes mellitus with diabetic peripheral angiopathy without gangrene: Secondary | ICD-10-CM | POA: Diagnosis not present

## 2017-11-03 NOTE — Telephone Encounter (Signed)
Spoke with pt today who has elected to have her ICD change out and lead revision on 9/13 with Dr Caryl Comes. Per Dr Caryl Comes, it is OK for her to have her H&P written on the day of her procedure. Pt would prefer to stay overnight since her lead will be revised. I told her I would discuss this with Dr Caryl Comes.   Pre procedure time and instructions have been reviewed with pt. A letter has been sent via MyChart for pt to further review. Pt has verbalized understanding and has no additional questions.

## 2017-11-04 ENCOUNTER — Other Ambulatory Visit (HOSPITAL_COMMUNITY): Payer: Self-pay | Admitting: Internal Medicine

## 2017-11-04 ENCOUNTER — Other Ambulatory Visit: Payer: Self-pay | Admitting: *Deleted

## 2017-11-04 ENCOUNTER — Ambulatory Visit (HOSPITAL_COMMUNITY)
Admission: RE | Admit: 2017-11-04 | Discharge: 2017-11-04 | Disposition: A | Discharge status: Home / Self Care | Payer: Medicare Other | Source: Ambulatory Visit | Attending: Internal Medicine | Admitting: Internal Medicine

## 2017-11-04 VITALS — BP 108/58 | HR 72 | Wt 189.6 lb

## 2017-11-04 DIAGNOSIS — Z951 Presence of aortocoronary bypass graft: Secondary | ICD-10-CM | POA: Insufficient documentation

## 2017-11-04 DIAGNOSIS — I340 Nonrheumatic mitral (valve) insufficiency: Secondary | ICD-10-CM | POA: Insufficient documentation

## 2017-11-04 DIAGNOSIS — I10 Essential (primary) hypertension: Secondary | ICD-10-CM | POA: Diagnosis not present

## 2017-11-04 DIAGNOSIS — Z8249 Family history of ischemic heart disease and other diseases of the circulatory system: Secondary | ICD-10-CM | POA: Insufficient documentation

## 2017-11-04 DIAGNOSIS — E032 Hypothyroidism due to medicaments and other exogenous substances: Secondary | ICD-10-CM

## 2017-11-04 DIAGNOSIS — N183 Chronic kidney disease, stage 3 (moderate): Secondary | ICD-10-CM | POA: Insufficient documentation

## 2017-11-04 DIAGNOSIS — I5022 Chronic systolic (congestive) heart failure: Secondary | ICD-10-CM

## 2017-11-04 DIAGNOSIS — K635 Polyp of colon: Secondary | ICD-10-CM | POA: Diagnosis not present

## 2017-11-04 DIAGNOSIS — I472 Ventricular tachycardia: Secondary | ICD-10-CM | POA: Insufficient documentation

## 2017-11-04 DIAGNOSIS — Z79899 Other long term (current) drug therapy: Secondary | ICD-10-CM | POA: Diagnosis not present

## 2017-11-04 DIAGNOSIS — Z823 Family history of stroke: Secondary | ICD-10-CM | POA: Diagnosis not present

## 2017-11-04 DIAGNOSIS — Z794 Long term (current) use of insulin: Secondary | ICD-10-CM | POA: Insufficient documentation

## 2017-11-04 DIAGNOSIS — I130 Hypertensive heart and chronic kidney disease with heart failure and stage 1 through stage 4 chronic kidney disease, or unspecified chronic kidney disease: Secondary | ICD-10-CM | POA: Diagnosis not present

## 2017-11-04 DIAGNOSIS — I255 Ischemic cardiomyopathy: Secondary | ICD-10-CM | POA: Diagnosis not present

## 2017-11-04 DIAGNOSIS — E1122 Type 2 diabetes mellitus with diabetic chronic kidney disease: Secondary | ICD-10-CM | POA: Diagnosis not present

## 2017-11-04 DIAGNOSIS — E1151 Type 2 diabetes mellitus with diabetic peripheral angiopathy without gangrene: Secondary | ICD-10-CM | POA: Insufficient documentation

## 2017-11-04 DIAGNOSIS — Z79890 Hormone replacement therapy (postmenopausal): Secondary | ICD-10-CM | POA: Diagnosis not present

## 2017-11-04 DIAGNOSIS — Z6832 Body mass index (BMI) 32.0-32.9, adult: Secondary | ICD-10-CM | POA: Insufficient documentation

## 2017-11-04 DIAGNOSIS — Z86718 Personal history of other venous thrombosis and embolism: Secondary | ICD-10-CM | POA: Insufficient documentation

## 2017-11-04 DIAGNOSIS — T462X1A Poisoning by other antidysrhythmic drugs, accidental (unintentional), initial encounter: Secondary | ICD-10-CM

## 2017-11-04 DIAGNOSIS — I447 Left bundle-branch block, unspecified: Secondary | ICD-10-CM | POA: Insufficient documentation

## 2017-11-04 DIAGNOSIS — Z833 Family history of diabetes mellitus: Secondary | ICD-10-CM | POA: Insufficient documentation

## 2017-11-04 DIAGNOSIS — E669 Obesity, unspecified: Secondary | ICD-10-CM | POA: Diagnosis not present

## 2017-11-04 DIAGNOSIS — Z7982 Long term (current) use of aspirin: Secondary | ICD-10-CM | POA: Insufficient documentation

## 2017-11-04 DIAGNOSIS — I480 Paroxysmal atrial fibrillation: Secondary | ICD-10-CM | POA: Diagnosis not present

## 2017-11-04 DIAGNOSIS — Z950 Presence of cardiac pacemaker: Secondary | ICD-10-CM | POA: Diagnosis not present

## 2017-11-04 DIAGNOSIS — E039 Hypothyroidism, unspecified: Secondary | ICD-10-CM | POA: Insufficient documentation

## 2017-11-04 DIAGNOSIS — I2510 Atherosclerotic heart disease of native coronary artery without angina pectoris: Secondary | ICD-10-CM | POA: Diagnosis not present

## 2017-11-04 LAB — CBC
HEMATOCRIT: 42.5 % (ref 36.0–46.0)
Hemoglobin: 13.5 g/dL (ref 12.0–15.0)
MCH: 30.2 pg (ref 26.0–34.0)
MCHC: 31.8 g/dL (ref 30.0–36.0)
MCV: 95.1 fL (ref 78.0–100.0)
PLATELETS: 168 10*3/uL (ref 150–400)
RBC: 4.47 MIL/uL (ref 3.87–5.11)
RDW: 14.2 % (ref 11.5–15.5)
WBC: 5.9 10*3/uL (ref 4.0–10.5)

## 2017-11-04 LAB — CUP PACEART REMOTE DEVICE CHECK
Brady Statistic AP VP Percent: 4.57 %
Brady Statistic AP VS Percent: 0.08 %
Brady Statistic AS VS Percent: 2.28 %
Brady Statistic RV Percent Paced: 95.47 %
Date Time Interrogation Session: 20190812052304
HIGH POWER IMPEDANCE MEASURED VALUE: 40 Ohm
HIGH POWER IMPEDANCE MEASURED VALUE: 49 Ohm
Implantable Lead Implant Date: 20111012
Implantable Lead Implant Date: 20151002
Implantable Lead Location: 753857
Lead Channel Impedance Value: 456 Ohm
Lead Channel Impedance Value: 456 Ohm
Lead Channel Impedance Value: 513 Ohm
Lead Channel Pacing Threshold Amplitude: 0.75 V
Lead Channel Pacing Threshold Amplitude: 3.5 V
Lead Channel Pacing Threshold Pulse Width: 0.4 ms
Lead Channel Sensing Intrinsic Amplitude: 14.625 mV
Lead Channel Sensing Intrinsic Amplitude: 14.625 mV
Lead Channel Setting Pacing Amplitude: 2.5 V
Lead Channel Setting Pacing Amplitude: 4.75 V
Lead Channel Setting Pacing Pulse Width: 0.4 ms
Lead Channel Setting Sensing Sensitivity: 0.45 mV
MDC IDC LEAD IMPLANT DT: 20151002
MDC IDC LEAD LOCATION: 753859
MDC IDC LEAD LOCATION: 753860
MDC IDC MSMT BATTERY REMAINING LONGEVITY: 2 mo
MDC IDC MSMT BATTERY VOLTAGE: 2.74 V
MDC IDC MSMT LEADCHNL LV IMPEDANCE VALUE: 513 Ohm
MDC IDC MSMT LEADCHNL LV IMPEDANCE VALUE: 874 Ohm
MDC IDC MSMT LEADCHNL LV PACING THRESHOLD PULSEWIDTH: 1 ms
MDC IDC MSMT LEADCHNL RA IMPEDANCE VALUE: 380 Ohm
MDC IDC MSMT LEADCHNL RA PACING THRESHOLD PULSEWIDTH: 0.4 ms
MDC IDC MSMT LEADCHNL RA SENSING INTR AMPL: 0.75 mV
MDC IDC MSMT LEADCHNL RA SENSING INTR AMPL: 0.75 mV
MDC IDC MSMT LEADCHNL RV PACING THRESHOLD AMPLITUDE: 1.125 V
MDC IDC PG IMPLANT DT: 20151002
MDC IDC SET LEADCHNL LV PACING PULSEWIDTH: 1 ms
MDC IDC SET LEADCHNL RA PACING AMPLITUDE: 2 V
MDC IDC STAT BRADY AS VP PERCENT: 93.07 %
MDC IDC STAT BRADY RA PERCENT PACED: 4.58 %

## 2017-11-04 LAB — COMPREHENSIVE METABOLIC PANEL
ALT: 24 U/L (ref 0–44)
AST: 24 U/L (ref 15–41)
Albumin: 4.3 g/dL (ref 3.5–5.0)
Alkaline Phosphatase: 74 U/L (ref 38–126)
Anion gap: 12 (ref 5–15)
BUN: 37 mg/dL — ABNORMAL HIGH (ref 6–20)
CALCIUM: 9.5 mg/dL (ref 8.9–10.3)
CHLORIDE: 101 mmol/L (ref 98–111)
CO2: 27 mmol/L (ref 22–32)
CREATININE: 1.91 mg/dL — AB (ref 0.44–1.00)
GFR, EST AFRICAN AMERICAN: 32 mL/min — AB (ref >60)
GFR, EST NON AFRICAN AMERICAN: 27 mL/min — AB (ref >60)
Glucose, Bld: 212 mg/dL — ABNORMAL HIGH (ref 70–99)
Potassium: 3.2 mmol/L — ABNORMAL LOW (ref 3.5–5.1)
SODIUM: 140 mmol/L (ref 135–145)
Total Bilirubin: 0.7 mg/dL (ref 0.3–1.2)
Total Protein: 7.8 g/dL (ref 6.5–8.1)

## 2017-11-04 LAB — PROTIME-INR
INR: 1.06
PROTHROMBIN TIME: 13.7 s (ref 11.4–15.2)

## 2017-11-04 LAB — TSH: TSH: 4.231 u[IU]/mL (ref 0.350–4.500)

## 2017-11-04 LAB — T4, FREE: Free T4: 1.06 ng/dL (ref 0.82–1.77)

## 2017-11-04 LAB — BRAIN NATRIURETIC PEPTIDE: B NATRIURETIC PEPTIDE 5: 284.2 pg/mL — AB (ref 0.0–100.0)

## 2017-11-04 MED ORDER — LEVOTHYROXINE SODIUM 25 MCG PO TABS: 25.0000 ug | ORAL_TABLET | Freq: Every day | ORAL | 0 refills | Status: DC

## 2017-11-04 NOTE — Progress Notes (Signed)
ReDS Vest - 11/04/17 1400      ReDS Vest   Fitting Posture  Sitting    Height Marker  --   station A   Ruler Value  31    ReDS Value  36

## 2017-11-04 NOTE — Patient Instructions (Signed)
Labs today  Your physician has recommended that you have a cardiopulmonary stress test (CPX). CPX testing is a non-invasive measurement of heart and lung function. It replaces a traditional treadmill stress test. This type of test provides a tremendous amount of information that relates not only to your present condition but also for future outcomes. This test combines measurements of you ventilation, respiratory gas exchange in the lungs, electrocardiogram (EKG), blood pressure and physical response before, during, and following an exercise protocol.  You have been referred to Dr Renne Crigler at [facility], they will call you for an appointment  Your physician recommends that you schedule a follow-up appointment in: 2 months

## 2017-11-04 NOTE — Telephone Encounter (Signed)
I accepted the refill request. Can someone help me set up a future PCP appointment with me to follow-up today's T3/T4 and medication use?

## 2017-11-04 NOTE — Progress Notes (Signed)
Patient ID: [name], female   DOB: [birth year], 62 y.o.   MRN: [id number]    Advanced Heart Failure Clinic Note  PCP: Dr. Posey Pronto  Primary HF:  Dr. Haroldine Laws  EP: Dr Beaulah Dinning Transplant : Dr Radene Knee   HPI: 62 y.o. female with history of HTN, DM2, past polysubstance abuse (ETOH, tobacco, cocaine), CAD s/p CABG x5 with MV annuloplasty (2007), ischemic cardiomyopathy s/p Medtronic CRT-D, chronic systolic HF EF 98-33%, CKD stage III and PAD. Blood type O+.  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 At time of cath LAD, LCX and RCA occluded. EF 45%. Had PCI of LAD followed by abrupt stent occlusion. Had repeat PCI of LAD and then underwent CABG x 5 with mitral valve annuloplasty in 2007.   Amitted 8/2-10/07/13 for syncope s/p ICD shock. Concern that VT was possibly from ischemia and taken for [facility]. Started on Amiodarone. Cardiac output dropped while in hospital so started on milrinone. Discharged home. Underwent CRT-D upgrade on 12/02/13  In 12/15, she was admitted with catheter infection. She was admitted and catheter was replaced.  Milrinone titrated off in January 2016 and has done reasonably well.   Currently undergoing heart-kidney transplant eval with Dr. Radene Knee at [facility]. Had cath 03/10/17 as part of process.   Today she returns for HF follow up. Planning on admit Friday for generator changeout by Dr Caryl Comes. Overall feeling fine. Denies PND/Orthopnea. Says she is SOB on Thursday before she take metolazone on Friday.  Appetite ok. No fever or chills. Weight at home 183-188 pounds. Taking all medications    R[facility] 03/10/2017  Ao = 99/59 (75) LV = 104/22 RA = 11 RV = 57/13 PA = 61/28 (39) PCW = 24 (v = 35-40) Fick cardiac output/index = 5.5/2.9 Thermo CO/CI = 6.9/3.7 PVR = 2.2 WU FA sat = 96% PA sat = 65%, 64% Assessment: 1. Severe 3v CAD 2. All 5 grafts open 3. Moderately elevated biventricular pressures with normal cardiac output.  Prominent v-waves in PCW tracing c/w with significant MR  Echos: 05/19/2012  EF 15% 2/15 EF 15%, s/p MV repair with moderate-severe MR.  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 Lives in [place] with 2 sons([name] and [name]).   ROS: All systems negative except as listed in HPI, PMH and Problem List.  Past Medical History:  Diagnosis Date  . AICD (automatic cardioverter/defibrillator) present   . Chronic systolic heart failure ([place])    a. ICM b. ECHO (06/2013): EF 15%, severe MR (06/2013) c. RHC (10/2013): RA 5, RV 23/2/5, PA 25/6 (14), PCWP 12, Fick CO/CI: 3.2/1.7, PVR 0.7 WU, PA 45%, 47% and 55% (with levophed 5), vagal response during cath. d. On home milrinone.  . CKD (chronic kidney disease), stage III ([place])   . Coronary artery disease    a. s/p CABG x 5 with MV annuloplasty 2007   . Diabetes mellitus   . DVT (deep venous  thrombosis) ([place]) 2009  . Hypertension   . Implantable defibrillator   medtronic   . Ischemic cardiomyopathy    a. s/p ICD. b. LHC (10/04/13): 1. Severe native CAD with all bypass grafts patent. c. CRT upgrade 12/2013.  [name] LBBB (left bundle branch block)   . Lipoma   . PAD (peripheral artery disease) ([place])   . PICC line infection    a. 01/2014 - PICC exchanged.  . Polysubstance abuse ([place])    history of  (cocaine, tobacco and ETOH)  . Presence of permanent cardiac pacemaker   . V-tach [facility])     Current Outpatient Medications  Medication Sig Dispense Refill  . ACCU-CHEK FASTCLIX LANCETS MISC Use to test blood  glucose 3 times daily. Dx:E11.9 102 each 5  . acetaminophen (TYLENOL) 500 MG tablet Take 500-1,000 mg by mouth every 6 (six) hours as needed for moderate pain or headache.     . allopurinol (ZYLOPRIM) 100 MG tablet TAKE 2 TABLETS BY MOUTH ONCE DAILY AT BEDTIME (Patient taking differently: Take 200 mg by mouth at bedtime. ) 180 tablet 3  . amiodarone (PACERONE) 200 MG tablet Take 0.5 tablets (100 mg total) by mouth daily. 15 tablet 3  . aspirin EC 81 MG tablet Take 81 mg by mouth every morning.     [name] atorvastatin (LIPITOR) 80 MG tablet TAKE 1 TABLET BY MOUTH ONCE DAILY (Patient taking differently: TAKE 80 MG BY MOUTH ONCE DAILY) 90 tablet 3  . Blood Glucose Monitoring Suppl (ACCU-CHEK NANO SMARTVIEW) W/DEVICE KIT Use to test blood glucose 2 times daily. Dx:E11.9 1 kit 0  . Cholecalciferol (VITAMIN D PO) Take 5,000 Units by mouth daily.     . diclofenac sodium (VOLTAREN) 1 % GEL Apply 4 g topically 4 (four) times daily. (Patient taking differently: Apply 4 g topically 4 (four) times daily as needed (for pain.). ) 1 Tube 2  . digoxin (LANOXIN) 0.125 MG tablet TAKE 1/2 (ONE-HALF) TABLET BY MOUTH EVERY OTHER DAY (Patient taking differently: TAKE 0.0625 MG TABLET BY MOUTH EVERY OTHER DAY) 15 tablet 3  . furosemide (LASIX) 80 MG tablet Take 1 tablet (80 mg total) by mouth daily. 45 tablet 3  . glucose blood (ACCU-CHEK SMARTVIEW) test strip Use to test blood glucose 4 times daily (before meals and at bedtime). Dx:E11.9. INSULIN DEPENDENT 100 each 2  . Insulin Glargine (LANTUS SOLOSTAR) 100 UNIT/ML Solostar Pen Inject 30 units into the skin at bedtime.Diagnosis code:E11.8 15 pen 3  . Insulin Pen Needle 32G X 4 MM MISC Use to inject insulin 1 time a day 100 each 3  . levothyroxine (SYNTHROID, LEVOTHROID) 25 MCG tablet Take 1 tablet (25 mcg total) by mouth daily before breakfast. 90 tablet 0  . losartan (COZAAR) 25 MG tablet TAKE 1/2 (ONE-HALF) TABLET BY MOUTH TWICE DAILY 60 tablet 4  . metolazone (ZAROXOLYN)  2.5 MG tablet Take 1 tablet (2.5 mg total) by mouth once a week. May take an additional tablet ONLY as directed by the CHF clinic. 15 tablet 3  . nitroGLYCERIN (NITROSTAT) 0.4 MG SL tablet Place 1 tablet (0.4 mg total) under the tongue every 5 (five) minutes as needed. For chest pain. (Patient taking differently: Place 0.4 mg under the tongue every 5 (five) minutes as needed for chest pain. ) 25 tablet 11  . potassium chloride SA (K-DUR,KLOR-CON) 20 MEQ tablet Take 1 tablet (20 mEq total) by mouth daily. 90 tablet 3  . sitaGLIPtin (JANUVIA) 25 MG tablet Take 1 tablet (25 mg total)  by mouth daily. 30 tablet 0   No current facility-administered medications for this encounter.     Vitals:   11/04/17 1350  BP: (!) 108/58  Pulse: 72  SpO2: 97%  Weight: 86 kg (189 lb 9.6 oz)    Wt Readings from Last 3 Encounters:  11/04/17 86 kg (189 lb 9.6 oz)  08/19/17 86 kg (189 lb 11.2 oz)  08/03/17 86 kg (189 lb 9.6 oz)     PHYSICAL EXAM: General:  Well appearing. No resp difficulty HEENT: normal Neck: supple. no JVD. Carotids 2+ bilat; no bruits. No lymphadenopathy or thryomegaly appreciated. Cor: PMI nondisplaced. Regular rate & rhythm. No rubs, gallops or murmurs. Lungs: clear Abdomen: soft, nontender, nondistended. No hepatosplenomegaly. No bruits or masses. Good bowel sounds. Extremities: no cyanosis, clubbing, rash, edema Neuro: alert & orientedx3, cranial nerves grossly intact. moves all 4 extremities w/o difficulty. Affect pleasant   ASSESSMENT & PLAN:  1. Chronic systolic CHF: Ischemic cardiomyopathy s/p Medtronic CRT-D, EF 20-25% ( Echo 04/2015). - Plan for generator change out of Friday. - She follows in the transplant clinic at [facility] q 6 months with Dr. Radene Knee. Being worked up for heart-kidney transplant.  - She is blood type O - Echo 9/18 EF 25% RV ok  NYHA II-III.  Volume status stable. Continue lasix 80 mg daily. And metolazone every Friday.  - Intolerant entresto and carvedilol due  to hypotension. Tried multiple times.  - On losartan 12.5 mg daily.  - [name] stopped with worsening renal function.  - Continue digoxin 0.0625 mg daily.  - ICD interrogated personally in clinic. Fluid below idex. Activity ~4 hours Trending up. No VT.  Repeat CPX.   : 2. CAD:  - Stable on recent cath 1/19 -No S/S ischemia Continue ASA/statin  3. Mitral regurgitation:  -s/p mitral valve annuloplasty with CABG in 2007. Moderate to severe MR on echo in 4/15. TEE (06/2013) mitral regurgitation appeared severe, anatomy of repaired valve does not look suitable for MV clipping and would be high risk for MV replacement.  - mild to moderate MR on recent echo 9/18  4. CKD stage III:  Renal US - no hydronephrosis,. I reviewed BMET from 9/3 at [facility] Creatinine 1.8  -Check BMET today  - Has been [facility] to [facility]  5.  NSVT:  - Continue amiodarone 100 mg.daily  TSH elelvated on 5/13. Started throid medications.  -No VT on device interrogation.   6. Obesity - Much improved. Body mass index is 32.54 kg/m.   7. PAD:  - Symptoms stable - Right mid SFA stenosis per [place] 08/30/14.    8. Colon polyps - Had colonoscopy 06/08/14 with 12 polyps.  - Repeat colonoscopy pending  9. Hypothyroidism TSH elevated 07/13/2017. PCP start synthroid. Likely related to amiodarone.   Follow up in 2 months.  Check CPX in 4 weeks after she recovers from generator change. Darrick Grinder, NP  11/04/17   Patient seen and examined with Darrick Grinder, NP. We discussed all aspects of the encounter. I agree with the assessment and plan as stated above.   Recently seen by Dr. Radene Knee at [facility] with concern over worsening fluid status, progressive HF and worsening renal function (in the setting of her taking several doses of metolazone). However she apparently did quite well on 6MW. Told to increase lasix.   However today she looks great. Says she remains very active with NYHA II symptoms. REDS vest  place and was 36% (normal  range 20-35%) so overall volume felt to be pretty good. Creatinin back down to 1.9. Recent renal u/s normal .ICD interrogated personally and impedance ok. No VT/VF or AF. She is scheduled for generator change on Friday with attempted repositioning of LV lead with plan for His Bundle pacing if not able to get LV lead to work). Will plan CPX testing once she recovers from ICD procedure to objectively assess functional capacity. Refer to Endo regarding whether or not to stop methimazole.   Total time personally spent 45 minutes. Over half that time spent discussing above.   Glori Bickers, MD  12:04 AM

## 2017-11-05 LAB — T3, FREE: T3, Free: 2.1 pg/mL (ref 2.0–4.4)

## 2017-11-10 ENCOUNTER — Other Ambulatory Visit: Payer: Self-pay | Admitting: Internal Medicine

## 2017-11-10 ENCOUNTER — Other Ambulatory Visit (HOSPITAL_COMMUNITY): Payer: Self-pay | Admitting: Cardiology

## 2017-11-10 DIAGNOSIS — E118 Type 2 diabetes mellitus with unspecified complications: Secondary | ICD-10-CM

## 2017-11-11 NOTE — Telephone Encounter (Signed)
Next appt 9/25 in [facility] [place].

## 2017-11-13 ENCOUNTER — Ambulatory Visit (HOSPITAL_COMMUNITY)
Admission: RE | Disposition: A | Discharge status: Home / Self Care | Payer: Self-pay | Source: Ambulatory Visit | Attending: Internal Medicine

## 2017-11-13 ENCOUNTER — Encounter (HOSPITAL_COMMUNITY): Payer: Self-pay | Admitting: Internal Medicine

## 2017-11-13 ENCOUNTER — Ambulatory Visit (HOSPITAL_COMMUNITY)
Admission: RE | Admit: 2017-11-13 | Discharge: 2017-11-14 | Disposition: A | Discharge status: Home / Self Care | Payer: Medicare Other | Source: Ambulatory Visit | Attending: Internal Medicine | Admitting: Internal Medicine

## 2017-11-13 ENCOUNTER — Other Ambulatory Visit: Payer: Self-pay

## 2017-11-13 DIAGNOSIS — I255 Ischemic cardiomyopathy: Secondary | ICD-10-CM | POA: Diagnosis not present

## 2017-11-13 DIAGNOSIS — Z95810 Presence of automatic (implantable) cardiac defibrillator: Secondary | ICD-10-CM | POA: Diagnosis present

## 2017-11-13 DIAGNOSIS — Z959 Presence of cardiac and vascular implant and graft, unspecified: Secondary | ICD-10-CM

## 2017-11-13 DIAGNOSIS — Z79899 Other long term (current) drug therapy: Secondary | ICD-10-CM | POA: Insufficient documentation

## 2017-11-13 DIAGNOSIS — E876 Hypokalemia: Secondary | ICD-10-CM | POA: Insufficient documentation

## 2017-11-13 DIAGNOSIS — I428 Other cardiomyopathies: Secondary | ICD-10-CM | POA: Diagnosis not present

## 2017-11-13 DIAGNOSIS — Z86718 Personal history of other venous thrombosis and embolism: Secondary | ICD-10-CM | POA: Insufficient documentation

## 2017-11-13 DIAGNOSIS — Z951 Presence of aortocoronary bypass graft: Secondary | ICD-10-CM | POA: Diagnosis not present

## 2017-11-13 DIAGNOSIS — N183 Chronic kidney disease, stage 3 (moderate): Secondary | ICD-10-CM | POA: Diagnosis not present

## 2017-11-13 DIAGNOSIS — Z955 Presence of coronary angioplasty implant and graft: Secondary | ICD-10-CM | POA: Insufficient documentation

## 2017-11-13 DIAGNOSIS — Z8249 Family history of ischemic heart disease and other diseases of the circulatory system: Secondary | ICD-10-CM | POA: Insufficient documentation

## 2017-11-13 DIAGNOSIS — Z006 Encounter for examination for normal comparison and control in clinical research program: Secondary | ICD-10-CM | POA: Diagnosis not present

## 2017-11-13 DIAGNOSIS — F17200 Nicotine dependence, unspecified, uncomplicated: Secondary | ICD-10-CM | POA: Diagnosis not present

## 2017-11-13 DIAGNOSIS — I447 Left bundle-branch block, unspecified: Secondary | ICD-10-CM | POA: Diagnosis not present

## 2017-11-13 DIAGNOSIS — Z7982 Long term (current) use of aspirin: Secondary | ICD-10-CM | POA: Diagnosis not present

## 2017-11-13 DIAGNOSIS — I5022 Chronic systolic (congestive) heart failure: Secondary | ICD-10-CM | POA: Diagnosis not present

## 2017-11-13 DIAGNOSIS — I2510 Atherosclerotic heart disease of native coronary artery without angina pectoris: Secondary | ICD-10-CM | POA: Insufficient documentation

## 2017-11-13 DIAGNOSIS — Z9889 Other specified postprocedural states: Secondary | ICD-10-CM | POA: Diagnosis not present

## 2017-11-13 DIAGNOSIS — E1122 Type 2 diabetes mellitus with diabetic chronic kidney disease: Secondary | ICD-10-CM | POA: Insufficient documentation

## 2017-11-13 DIAGNOSIS — R918 Other nonspecific abnormal finding of lung field: Secondary | ICD-10-CM | POA: Diagnosis not present

## 2017-11-13 DIAGNOSIS — Z4502 Encounter for adjustment and management of automatic implantable cardiac defibrillator: Secondary | ICD-10-CM | POA: Diagnosis not present

## 2017-11-13 DIAGNOSIS — I130 Hypertensive heart and chronic kidney disease with heart failure and stage 1 through stage 4 chronic kidney disease, or unspecified chronic kidney disease: Secondary | ICD-10-CM | POA: Diagnosis not present

## 2017-11-13 DIAGNOSIS — Z794 Long term (current) use of insulin: Secondary | ICD-10-CM | POA: Insufficient documentation

## 2017-11-13 DIAGNOSIS — Z833 Family history of diabetes mellitus: Secondary | ICD-10-CM | POA: Diagnosis not present

## 2017-11-13 DIAGNOSIS — Z79890 Hormone replacement therapy (postmenopausal): Secondary | ICD-10-CM | POA: Diagnosis not present

## 2017-11-13 DIAGNOSIS — E1151 Type 2 diabetes mellitus with diabetic peripheral angiopathy without gangrene: Secondary | ICD-10-CM | POA: Diagnosis not present

## 2017-11-13 HISTORY — PX: ICD GENERATOR CHANGEOUT: EP1231

## 2017-11-13 HISTORY — PX: LEAD REVISION/REPAIR: EP1213

## 2017-11-13 LAB — BASIC METABOLIC PANEL
Anion gap: 9 (ref 5–15)
BUN: 33 mg/dL — AB (ref 6–20)
CO2: 27 mmol/L (ref 22–32)
CREATININE: 1.57 mg/dL — AB (ref 0.44–1.00)
Calcium: 9.2 mg/dL (ref 8.9–10.3)
Chloride: 106 mmol/L (ref 98–111)
GFR calc Af Amer: 40 mL/min — ABNORMAL LOW (ref >60)
GFR, EST NON AFRICAN AMERICAN: 35 mL/min — AB (ref >60)
Glucose, Bld: 110 mg/dL — ABNORMAL HIGH (ref 70–99)
POTASSIUM: 3.7 mmol/L (ref 3.5–5.1)
SODIUM: 142 mmol/L (ref 135–145)

## 2017-11-13 LAB — SURGICAL PCR SCREEN
MRSA, PCR: NEGATIVE
STAPHYLOCOCCUS AUREUS: NEGATIVE

## 2017-11-13 LAB — GLUCOSE, CAPILLARY
GLUCOSE-CAPILLARY: 109 mg/dL — AB (ref 70–99)
GLUCOSE-CAPILLARY: 158 mg/dL — AB (ref 70–99)
Glucose-Capillary: 162 mg/dL — ABNORMAL HIGH (ref 70–99)

## 2017-11-13 SURGERY — ICD GENERATOR CHANGEOUT

## 2017-11-13 MED ORDER — MIDAZOLAM HCL 5 MG/5ML IJ SOLN
INTRAMUSCULAR | Status: AC
  Filled 2017-11-13: qty 5

## 2017-11-13 MED ORDER — LIDOCAINE HCL 1 % IJ SOLN
INTRAMUSCULAR | Status: AC
  Filled 2017-11-13: qty 20

## 2017-11-13 MED ORDER — LIDOCAINE HCL (PF) 1 % IJ SOLN
INTRAMUSCULAR | Status: DC | PRN
  Administered 2017-11-13: 45 mL

## 2017-11-13 MED ORDER — VERAPAMIL HCL 2.5 MG/ML IV SOLN
INTRAVENOUS | Status: AC
  Filled 2017-11-13: qty 2

## 2017-11-13 MED ORDER — MIDAZOLAM HCL 5 MG/5ML IJ SOLN
INTRAMUSCULAR | Status: DC | PRN
  Administered 2017-11-13: 1 mg via INTRAVENOUS
  Administered 2017-11-13: 2 mg via INTRAVENOUS
  Administered 2017-11-13 (×2): 1 mg via INTRAVENOUS

## 2017-11-13 MED ORDER — ASPIRIN EC 81 MG PO TBEC
81.0000 mg | DELAYED_RELEASE_TABLET | Freq: Every day | ORAL | Status: DC
  Administered 2017-11-14: 81 mg via ORAL
  Filled 2017-11-13: qty 1

## 2017-11-13 MED ORDER — HEPARIN (PORCINE) IN NACL 1000-0.9 UT/500ML-% IV SOLN
INTRAVENOUS | Status: DC | PRN
  Administered 2017-11-13: 500 mL

## 2017-11-13 MED ORDER — ONDANSETRON HCL 4 MG/2ML IJ SOLN: 4.0000 mg | Freq: Four times a day (QID) | INTRAMUSCULAR | Status: DC | PRN

## 2017-11-13 MED ORDER — CEFAZOLIN SODIUM-DEXTROSE 1-4 GM/50ML-% IV SOLN
1.0000 g | Freq: Four times a day (QID) | INTRAVENOUS | Status: AC
  Administered 2017-11-13 – 2017-11-14 (×3): 1 g via INTRAVENOUS
  Filled 2017-11-13 (×3): qty 50

## 2017-11-13 MED ORDER — FUROSEMIDE 80 MG PO TABS
80.0000 mg | ORAL_TABLET | Freq: Every day | ORAL | Status: DC
  Administered 2017-11-14: 80 mg via ORAL
  Filled 2017-11-13: qty 1

## 2017-11-13 MED ORDER — DIGOXIN 125 MCG PO TABS
0.0625 mg | ORAL_TABLET | Freq: Every day | ORAL | Status: DC
  Administered 2017-11-14: 0.0625 mg via ORAL
  Filled 2017-11-13: qty 1

## 2017-11-13 MED ORDER — SODIUM CHLORIDE 0.9 % IV SOLN: INTRAVENOUS | Status: AC

## 2017-11-13 MED ORDER — METOLAZONE 2.5 MG PO TABS: 2.5000 mg | ORAL_TABLET | ORAL | Status: DC

## 2017-11-13 MED ORDER — ACETAMINOPHEN 325 MG PO TABS
325.0000 mg | ORAL_TABLET | ORAL | Status: DC | PRN
  Administered 2017-11-13 – 2017-11-14 (×3): 650 mg via ORAL
  Filled 2017-11-13 (×3): qty 2

## 2017-11-13 MED ORDER — SODIUM CHLORIDE 0.9 % IV SOLN
INTRAVENOUS | Status: DC
  Administered 2017-11-13: 10:00:00 via INTRAVENOUS

## 2017-11-13 MED ORDER — CHLORHEXIDINE GLUCONATE 4 % EX LIQD
60.0000 mL | Freq: Once | CUTANEOUS | Status: DC
  Filled 2017-11-13: qty 60

## 2017-11-13 MED ORDER — SODIUM CHLORIDE 0.9 % IV SOLN
INTRAVENOUS | Status: AC
  Filled 2017-11-13: qty 2

## 2017-11-13 MED ORDER — LEVOTHYROXINE SODIUM 25 MCG PO TABS
25.0000 ug | ORAL_TABLET | Freq: Every day | ORAL | Status: DC
  Administered 2017-11-14: 25 ug via ORAL
  Filled 2017-11-13: qty 1

## 2017-11-13 MED ORDER — MUPIROCIN 2 % EX OINT: 1.0000 "application " | TOPICAL_OINTMENT | Freq: Once | CUTANEOUS | Status: DC

## 2017-11-13 MED ORDER — FENTANYL CITRATE (PF) 100 MCG/2ML IJ SOLN
INTRAMUSCULAR | Status: DC | PRN
  Administered 2017-11-13 (×4): 25 ug via INTRAVENOUS

## 2017-11-13 MED ORDER — LINAGLIPTIN 5 MG PO TABS
5.0000 mg | ORAL_TABLET | Freq: Every day | ORAL | Status: DC
  Administered 2017-11-14: 5 mg via ORAL
  Filled 2017-11-13: qty 1

## 2017-11-13 MED ORDER — SODIUM CHLORIDE 0.9 % IV SOLN
80.0000 mg | INTRAVENOUS | Status: AC
  Administered 2017-11-13: 80 mg

## 2017-11-13 MED ORDER — FENTANYL CITRATE (PF) 100 MCG/2ML IJ SOLN
INTRAMUSCULAR | Status: AC
  Filled 2017-11-13: qty 2

## 2017-11-13 MED ORDER — MUPIROCIN 2 % EX OINT
TOPICAL_OINTMENT | CUTANEOUS | Status: AC
  Administered 2017-11-13: 10:00:00
  Filled 2017-11-13: qty 22

## 2017-11-13 MED ORDER — LOSARTAN POTASSIUM 25 MG PO TABS
12.5000 mg | ORAL_TABLET | Freq: Two times a day (BID) | ORAL | Status: DC
  Administered 2017-11-13 – 2017-11-14 (×2): 12.5 mg via ORAL
  Filled 2017-11-13 (×2): qty 1

## 2017-11-13 MED ORDER — POTASSIUM CHLORIDE CRYS ER 20 MEQ PO TBCR
20.0000 meq | EXTENDED_RELEASE_TABLET | Freq: Every day | ORAL | Status: DC
  Administered 2017-11-14: 20 meq via ORAL
  Filled 2017-11-13: qty 1

## 2017-11-13 MED ORDER — LIDOCAINE HCL 1 % IJ SOLN
INTRAMUSCULAR | Status: AC
  Filled 2017-11-13: qty 60

## 2017-11-13 MED ORDER — HEPARIN (PORCINE) IN NACL 1000-0.9 UT/500ML-% IV SOLN
INTRAVENOUS | Status: AC
  Filled 2017-11-13: qty 500

## 2017-11-13 MED ORDER — CEFAZOLIN SODIUM-DEXTROSE 2-4 GM/100ML-% IV SOLN
2.0000 g | INTRAVENOUS | Status: AC
  Administered 2017-11-13: 2 g via INTRAVENOUS

## 2017-11-13 MED ORDER — AMIODARONE HCL 100 MG PO TABS
100.0000 mg | ORAL_TABLET | Freq: Every day | ORAL | Status: DC
  Administered 2017-11-14: 100 mg via ORAL
  Filled 2017-11-13: qty 1

## 2017-11-13 MED ORDER — ATORVASTATIN CALCIUM 80 MG PO TABS
80.0000 mg | ORAL_TABLET | Freq: Every day | ORAL | Status: DC
  Administered 2017-11-14: 80 mg via ORAL
  Filled 2017-11-13: qty 1

## 2017-11-13 MED ORDER — CEFAZOLIN SODIUM-DEXTROSE 2-4 GM/100ML-% IV SOLN
INTRAVENOUS | Status: AC
  Filled 2017-11-13: qty 100

## 2017-11-13 MED ORDER — ACETAMINOPHEN 500 MG PO TABS: 500.0000 mg | ORAL_TABLET | Freq: Four times a day (QID) | ORAL | Status: DC | PRN

## 2017-11-13 SURGICAL SUPPLY — 14 items
CABLE SURGICAL S-101-97-12 (CABLE) ×3 IMPLANT
CATH RIGHTSITE C315HIS02 (CATHETERS) ×3 IMPLANT
HEMOSTAT SURGICEL 2X4 FIBR (HEMOSTASIS) ×3 IMPLANT
ICD CLARIA MRI DTMA1D1 (ICD Generator) ×3 IMPLANT
KIT LEAD END CAP (Cap) ×1 IMPLANT
KIT MICROPUNCTURE NIT STIFF (SHEATH) ×3 IMPLANT
LEAD END CAP (Cap) ×2 IMPLANT
LEAD SELECT SECURE 3830 383069 (Lead) ×1 IMPLANT
PAD PRO RADIOLUCENT 2001M-C (PAD) ×3 IMPLANT
SELECT SECURE 3830 383069 (Lead) ×3 IMPLANT
SHEATH CLASSIC 7F (SHEATH) ×3 IMPLANT
SLITTER 6232ADJ (MISCELLANEOUS) ×3 IMPLANT
TRAY PACEMAKER INSERTION (PACKS) ×3 IMPLANT
WIRE HI TORQ VERSACORE-J 145CM (WIRE) ×3 IMPLANT

## 2017-11-13 NOTE — Plan of Care (Signed)
Patient progress according to plan of care

## 2017-11-13 NOTE — H&P (Signed)
Patient Care Team: [name], MD as PCP - General Deboraha Sprang, MD (Cardiology) Bensimhon, Shaune Pascal, MD (Cardiology)   HPI  [name] is a 62 y.o. female Seen for ICD generator replacement.  Device originally implanted in 2011 and underwent (difficult) CRT upgrade 2015  Her device has reached ERI, somewhat prematurely 2/2 high LV threshold  She has a history of ischemic and nonischemic heart disease. She is status post CABG and mitral valve annuloplasty.  [name] She's been seen at [facility] for consideration of transplant evaluation of heart and possibly kidney.--   Has been struggling of late with DOE-feels better when Friday gets her and she gets her metolazone     DATE TEST    2/13 Cath   EF 10-15 %   9/15 LHC  Patent graft  2/17 Echo   EF 20-25% %   9/18 Echo  Ef 20-25%   1/19 LHC  3V CAD patent grafts   Date TSH LFTs Cr K  9/17  4.8 48      2/18   1.73   12/18 <0.015(1/19) 69 1.63   3/19 10.5  1.48 4.8  9/19 4.23  1.91 3.2  \   Records and Results Reviewed   Past Medical History:  Diagnosis Date  . AICD (automatic cardioverter/defibrillator) present   . Chronic systolic heart failure ([place])    a. ICM b. ECHO (06/2013): EF 15%, severe MR (06/2013) c. RHC (10/2013): RA 5, RV 23/2/5, PA 25/6 (14), PCWP 12, Fick CO/CI: 3.2/1.7, PVR 0.7 WU, PA 45%, 47% and 55% (with levophed 5), vagal response during cath. d. On home milrinone.  . CKD (chronic kidney disease), stage III ([place])   . Coronary artery disease    a. s/p CABG x 5 with MV annuloplasty 2007   . Diabetes mellitus   . DVT (deep venous thrombosis) ([place]) 2009  . Hypertension   . Implantable defibrillator   medtronic   . Ischemic cardiomyopathy    a. s/p ICD. b. LHC (10/04/13): 1. Severe native CAD with all bypass grafts patent. c. CRT upgrade 12/2013.  [name] LBBB (left bundle branch block)   . Lipoma   . PAD (peripheral artery disease) ([place])   . PICC line infection    a. 01/2014 - PICC  exchanged.  . Polysubstance abuse ([place])    history of  (cocaine, tobacco and ETOH)  . Presence of permanent cardiac pacemaker   . V-tach [facility])     Past Surgical History:  Procedure Laterality Date  . ANKLE FRACTURE SURGERY Right    plate inserted  . ANKLE SURGERY Right    plate removed  . BI-VENTRICULAR IMPLANTABLE CARDIOVERTER DEFIBRILLATOR UPGRADE  12/02/2013   MDT Mark CRTD upgrade by Dr Caryl Comes  . BI-VENTRICULAR IMPLANTABLE CARDIOVERTER DEFIBRILLATOR UPGRADE N/A 12/02/2013   Procedure: BI-VENTRICULAR IMPLANTABLE CARDIOVERTER DEFIBRILLATOR UPGRADE;  Surgeon: Deboraha Sprang, MD;  Location: [facility] CATH LAB;  Service: Cardiovascular;  Laterality: N/A;  . CARDIAC CATHETERIZATION    . CARDIAC DEFIBRILLATOR PLACEMENT     2011, Followed By Dr. Caryl Comes  . COLONOSCOPY WITH PROPOFOL N/A 06/08/2014   Procedure: COLONOSCOPY WITH PROPOFOL;  Surgeon: Wilford Corner, MD;  Location: [facility] ENDOSCOPY;  Service: Endoscopy;  Laterality: N/A;  . COLONOSCOPY WITH PROPOFOL N/A 06/17/2017   Procedure: COLONOSCOPY WITH PROPOFOL;  Surgeon: Wilford Corner, MD;  Location: [place];  Service: Endoscopy;  Laterality: N/A;  . CORONARY ARTERY BYPASS GRAFT  10/2005  . LEFT AND RIGHT HEART  CATHETERIZATION WITH CORONARY/GRAFT ANGIOGRAM N/A 03/17/2011   Procedure: LEFT AND RIGHT HEART CATHETERIZATION WITH Beatrix Fetters;  Surgeon: Jolaine Artist, MD;  Location: [facility] CATH LAB;  Service: Cardiovascular;  Laterality: N/A;  . LEFT AND RIGHT HEART CATHETERIZATION WITH CORONARY/GRAFT ANGIOGRAM N/A 10/04/2013   Procedure: LEFT AND RIGHT HEART CATHETERIZATION WITH Beatrix Fetters;  Surgeon: Jolaine Artist, MD;  Location: [facility] CATH LAB;  Service: Cardiovascular;  Laterality: N/A;  . RIGHT HEART CATHETERIZATION N/A 05/23/2013   Procedure: RIGHT HEART CATH;  Surgeon: Jolaine Artist, MD;  Location: [facility] CATH LAB;  Service: Cardiovascular;  Laterality: N/A;  . RIGHT HEART CATHETERIZATION N/A 11/01/2013   Procedure:  RIGHT HEART CATH;  Surgeon: Jolaine Artist, MD;  Location: [facility] CATH LAB;  Service: Cardiovascular;  Laterality: N/A;  . RIGHT HEART CATHETERIZATION N/A 11/25/2013   Procedure: RIGHT HEART CATH;  Surgeon: Jolaine Artist, MD;  Location: [facility] CATH LAB;  Service: Cardiovascular;  Laterality: N/A;  . RIGHT/LEFT HEART CATH AND CORONARY/GRAFT ANGIOGRAPHY N/A 03/10/2017   Procedure: RIGHT/LEFT HEART CATH AND CORONARY/GRAFT ANGIOGRAPHY;  Surgeon: Jolaine Artist, MD;  Location: [place] CV LAB;  Service: Cardiovascular;  Laterality: N/A;  . TEE WITHOUT CARDIOVERSION N/A 06/02/2013   Procedure: TRANSESOPHAGEAL ECHOCARDIOGRAM (TEE);  Surgeon: Larey Dresser, MD;  Location: [facility] ENDOSCOPY;  Service: Cardiovascular;  Laterality: N/A;    Current Facility-Administered Medications  Medication Dose Route Frequency Provider Last Rate Last Dose  . 0.9 %  sodium chloride infusion   Intravenous Continuous Deboraha Sprang, MD 50 mL/hr at 11/13/17 1021    . 0.9 %  sodium chloride infusion   Intravenous Continuous Deboraha Sprang, MD 50 mL/hr at 11/13/17 1020    . ceFAZolin (ANCEF) IVPB 2g/100 mL premix  2 g Intravenous On Call Deboraha Sprang, MD      . chlorhexidine (HIBICLENS) 4 % liquid 4 application  60 mL Topical Once Deboraha Sprang, MD      . gentamicin (GARAMYCIN) 80 mg in sodium chloride 0.9 % 500 mL irrigation  80 mg Irrigation On Call Deboraha Sprang, MD        No Known Allergies    Social History   Tobacco Use  . Smoking status: Former Smoker    Years: 32.00    Types: Cigarettes    Last attempt to quit: 03/03/2011    Years since quitting: 6.7  . Smokeless tobacco: Never Used  . Tobacco comment: light smoker  Substance Use Topics  . Alcohol use: No    Alcohol/week: 0.0 standard drinks  . Drug use: No    Comment: Has history of cocaine use, denies recent     Family History  Problem Relation Age of Onset  . Heart attack Mother        Died age 60  . Diabetes Maternal Grandmother   .  Heart attack Cousin   . Heart attack Maternal Uncle      Current Meds  Medication Sig  . acetaminophen (TYLENOL) 500 MG tablet Take 500-1,000 mg by mouth every 6 (six) hours as needed for moderate pain or headache.   . allopurinol (ZYLOPRIM) 100 MG tablet TAKE 2 TABLETS BY MOUTH ONCE DAILY AT BEDTIME (Patient taking differently: Take 200 mg by mouth at bedtime. )  . amiodarone (PACERONE) 200 MG tablet Take 0.5 tablets (100 mg total) by mouth daily.  [name] aspirin EC 81 MG tablet Take 81 mg by mouth every morning.   [name] atorvastatin (LIPITOR) 80 MG tablet  TAKE 1 TABLET BY MOUTH ONCE DAILY (Patient taking differently: TAKE 80 MG BY MOUTH ONCE DAILY)  . Cholecalciferol (VITAMIN D PO) Take 5,000 Units by mouth daily.   . digoxin (LANOXIN) 0.125 MG tablet TAKE 0.0625 MG TABLET BY MOUTH EVERY OTHER DAY  . furosemide (LASIX) 80 MG tablet Take 1 tablet (80 mg total) by mouth daily.  . Insulin Glargine (LANTUS SOLOSTAR) 100 UNIT/ML Solostar Pen Inject 30 units into the skin at bedtime.Diagnosis code:E11.8  . JANUVIA 25 MG tablet TAKE 1 TABLET BY MOUTH ONCE DAILY  . levothyroxine (SYNTHROID, LEVOTHROID) 25 MCG tablet Take 1 tablet (25 mcg total) by mouth daily before breakfast.  . losartan (COZAAR) 25 MG tablet TAKE 1/2 (ONE-HALF) TABLET BY MOUTH TWICE DAILY (Patient taking differently: Take 12.5 mg by mouth 2 (two) times daily. )  . metolazone (ZAROXOLYN) 2.5 MG tablet Take 1 tablet (2.5 mg total) by mouth once a week. May take an additional tablet ONLY as directed by the CHF clinic.  . nitroGLYCERIN (NITROSTAT) 0.4 MG SL tablet Place 1 tablet (0.4 mg total) under the tongue every 5 (five) minutes as needed. For chest pain. (Patient taking differently: Place 0.4 mg under the tongue every 5 (five) minutes as needed for chest pain. )  . potassium chloride SA (K-DUR,KLOR-CON) 20 MEQ tablet Take 1 tablet (20 mEq total) by mouth daily.     Review of Systems negative except from HPI and PMH  Physical Exam BP  106/68   Pulse 69   Temp 97.9 F (36.6 C) (Oral)   Ht 5\' 4"  (1.626 m)   Wt 85.3 kg   SpO2 97%   BMI 32.27 kg/m  Well developed and well nourished in no acute distress HENT normal E scleral and icterus clear Neck Supple JVP flat; carotids brisk and full Clear to ausculation  Regular rate and rhythm, no murmurs gallops or rub Soft with active bowel sounds No clubbing cyanosis  Edema Alert and oriented, grossly normal motor and sensory function Skin Warm and Dry  ECG SR QRS 148 with monophasic R wave ECG SR P-synchronous/ AV  pacing QRS 140 or so  Assessment and  Plan  NICM/ICM  CHF class 3  LBBB <150 msec  CRT at ERI  High LV threshold  Hypokalemia  Renal Insufficiency grade 3    Pts device at ERI w premature battery depletion 2/2 high LV threshold.   Four  alternatives 1)  Replace LV lead-- reviewing my notes from original implant not at all sanguine that there is an alternative venous site 2) surgical LV lead placement  May be useful, but with prior sternotomy, not a great option  3)  His bundle pacing, will attempt that today, though concerned about occlusion of the twice operated upon [place] vein 4) Generator replacement with accepting of high thresholds.  We will end up w #3 or 4  Reviewed with pt and previous discussions with dr Reine Just  We have reviewed the benefits and risks of generator replacement.  These include but are not limited to lead fracture and infection.  The patient understands, agrees and is willing to proceed.

## 2017-11-13 NOTE — Progress Notes (Signed)
Alert and oriented female admitted to unit with left arm in sling. Dressing to left upper shoulder dry and intact with no notable drainage on dressing.  Patient reminded to not use left arm. Arm in sling and propped on pillow.  V/S being obtained as ordered and recorded.  Patient offers no c/o.

## 2017-11-14 ENCOUNTER — Ambulatory Visit (HOSPITAL_COMMUNITY): Payer: Medicare Other

## 2017-11-14 ENCOUNTER — Other Ambulatory Visit: Payer: Self-pay

## 2017-11-14 DIAGNOSIS — E1122 Type 2 diabetes mellitus with diabetic chronic kidney disease: Secondary | ICD-10-CM | POA: Diagnosis not present

## 2017-11-14 DIAGNOSIS — I130 Hypertensive heart and chronic kidney disease with heart failure and stage 1 through stage 4 chronic kidney disease, or unspecified chronic kidney disease: Secondary | ICD-10-CM | POA: Diagnosis not present

## 2017-11-14 DIAGNOSIS — I255 Ischemic cardiomyopathy: Secondary | ICD-10-CM | POA: Diagnosis not present

## 2017-11-14 DIAGNOSIS — N183 Chronic kidney disease, stage 3 (moderate): Secondary | ICD-10-CM | POA: Diagnosis not present

## 2017-11-14 DIAGNOSIS — Z95810 Presence of automatic (implantable) cardiac defibrillator: Secondary | ICD-10-CM | POA: Diagnosis not present

## 2017-11-14 DIAGNOSIS — I5022 Chronic systolic (congestive) heart failure: Secondary | ICD-10-CM | POA: Diagnosis not present

## 2017-11-14 DIAGNOSIS — Z006 Encounter for examination for normal comparison and control in clinical research program: Secondary | ICD-10-CM | POA: Diagnosis not present

## 2017-11-14 DIAGNOSIS — I428 Other cardiomyopathies: Secondary | ICD-10-CM | POA: Diagnosis not present

## 2017-11-14 LAB — GLUCOSE, CAPILLARY: GLUCOSE-CAPILLARY: 130 mg/dL — AB (ref 70–99)

## 2017-11-14 MED ORDER — LOSARTAN POTASSIUM 25 MG PO TABS: 12.5000 mg | ORAL_TABLET | Freq: Two times a day (BID) | ORAL | Status: DC

## 2017-11-14 MED ORDER — ALLOPURINOL 100 MG PO TABS: 200.0000 mg | ORAL_TABLET | Freq: Every day | ORAL | Status: DC

## 2017-11-14 MED ORDER — BACITRACIN-NEOMYCIN-POLYMYXIN OINTMENT TUBE: 1.0000 "application " | TOPICAL_OINTMENT | Freq: Two times a day (BID) | CUTANEOUS | 0 refills | Status: DC

## 2017-11-14 MED ORDER — BACITRACIN-NEOMYCIN-POLYMYXIN OINTMENT TUBE
TOPICAL_OINTMENT | Freq: Two times a day (BID) | CUTANEOUS | Status: DC
  Administered 2017-11-14: 13:00:00 via TOPICAL
  Filled 2017-11-14 (×2): qty 14

## 2017-11-14 NOTE — Discharge Summary (Addendum)
Discharge Summary    Patient ID: [name] MRN: [id number]; DOB: [birth year]  Admit date: 11/13/2017 Discharge date: 11/14/2017  Primary Care Provider: [name], MD  Primary Cardiologist: Glori Bickers, MD  Primary Electrophysiologist:  Dr. Caryl Comes  Discharge Diagnoses    Principal Problem:   S/P ICD (internal cardiac defibrillator) procedure 11/13/17 ERI lead revision and gen change.MDT Active Problems:   Ischemic/ nonischemic cardiomyopathy   Chronic systolic heart failure (HCC)   Implantable defibrillator   medtronic   LBBB (left bundle branch block)   Congestive heart failure, NYHA class 3, chronic, systolic (HCC)   Allergies No Known Allergies  Diagnostic Studies/Procedures    ICD change out and lead revision repair by Dr. Caryl Comes 11/13/17 _____________   History of Present Illness     32yoF with hx of ICD and now ERI.  ERI is somewhat premature.  Device implanted 2011 and difficult CRT upgrade 2015.  Other hx of ischemic and nonischemic Heart disease.  Also with hx CABG and MV annuloplasty.  Seen at [facility] for possible transplant eval. Of heart and possible kidney .    Admitted 11/13/17 for obs after procedure.    Hospital Course     Consultants: none   Today pt is stable without complaints she has been seen and evaluated by Dr. Lovena Le.  Found stable for discharge.  She has appts arranged with wound check and HF.    CXR 1. Biventricular pacer/ICD with stable lead positioning compared to 2015 fluoroscopy. 2. Stable cardiomegaly. 3. Atelectatic opacities at the bases.  Device check was stable.  Small skin tear when dressing removed will add antibiotic ointment twice a day.  But none on incision.    _____________  Discharge Vitals Blood pressure 109/70, pulse 66, temperature 98 F (36.7 C), temperature source Oral, resp. rate 19, height '5\' 4"'$  (1.626 m), weight 87.6 kg, SpO2 95 %.  Filed Weights   11/13/17 0937 11/13/17 1500 11/14/17 0314  Weight:  85.3 kg 85.8 kg 87.6 kg    Labs & Radiologic Studies    CBC No results for input(s): WBC, NEUTROABS, HGB, HCT, MCV, PLT in the last 72 hours. Basic Metabolic Panel Recent Labs    11/13/17 1006  NA 142  K 3.7  CL 106  CO2 27  GLUCOSE 110*  BUN 33*  CREATININE 1.57*  CALCIUM 9.2   Liver Function Tests No results for input(s): AST, ALT, ALKPHOS, BILITOT, PROT, ALBUMIN in the last 72 hours. No results for input(s): LIPASE, AMYLASE in the last 72 hours. Cardiac Enzymes No results for input(s): CKTOTAL, CKMB, CKMBINDEX, TROPONINI in the last 72 hours. BNP Invalid input(s): POCBNP D-Dimer No results for input(s): DDIMER in the last 72 hours. Hemoglobin A1C No results for input(s): HGBA1C in the last 72 hours. Fasting Lipid Panel No results for input(s): CHOL, HDL, LDLCALC, TRIG, CHOLHDL, LDLDIRECT in the last 72 hours. Thyroid Function Tests No results for input(s): TSH, T4TOTAL, T3FREE, THYROIDAB in the last 72 hours.  Invalid input(s): FREET3 _____________  Dg Chest 2 View  Result Date: 11/14/2017 CLINICAL DATA:  Cardiac device in place EXAM: CHEST - 2 VIEW COMPARISON:  12/02/2013 FINDINGS: Biventricular pacer from the left with ICD component. Lead positioning is stable from fluoroscopy in 2015. Prior median sternotomy with changes of CABG and mitral valve repair. Streaky opacities at the bases, likely atelectasis. No Kerley lines, effusion, or pneumothorax. IMPRESSION: 1. Biventricular pacer/ICD with stable lead positioning compared to 2015 fluoroscopy. 2. Stable cardiomegaly. 3. Atelectatic opacities at  the bases. Electronically Signed   By: Monte Fantasia M.D.   On: 11/14/2017 08:56   Disposition   Pt is being discharged home today in good condition.  Follow-up Plans & Appointments    For small tear place antibiotic ointment on the area twice a day.  Do not put any on incision itself.      Follow-up Information    [facility] Follow up on  11/25/2017.   Specialty:  Cardiology Why:  3:30PM, wound check visit Contact information: [address] [place]       Deboraha Sprang, MD Follow up on 02/17/2018.   Specialty:  Cardiology Why:  2:15PM Contact information: [address] [address] [phone]        Jolaine Artist, MD Follow up on 11/25/2017.   Specialty:  Cardiology Why:  at 1:45 pm  Contact information: [place] [address] [phone]            Discharge Medications   Allergies as of 11/14/2017   No Known Allergies     Medication List    TAKE these medications   ACCU-CHEK FASTCLIX LANCETS Misc Use to test blood glucose 3 times daily. Dx:E11.9   ACCU-CHEK NANO SMARTVIEW w/Device Kit Use to test blood glucose 2 times daily. Dx:E11.9   acetaminophen 500 MG tablet Commonly known as:  TYLENOL Take 500-1,000 mg by mouth every 6 (six) hours as needed for moderate pain or headache.   allopurinol 100 MG tablet Commonly known as:  ZYLOPRIM Take 2 tablets (200 mg total) by mouth at bedtime.   amiodarone 200 MG tablet Commonly known as:  PACERONE Take 0.5 tablets (100 mg total) by mouth daily.   aspirin EC 81 MG tablet Take 81 mg by mouth every morning.   atorvastatin 80 MG tablet Commonly known as:  LIPITOR TAKE 1 TABLET BY MOUTH ONCE DAILY What changed:    how much to take  how to take this  when to take this   digoxin 0.125 MG tablet Commonly known as:  LANOXIN TAKE 0.0625 MG TABLET BY MOUTH EVERY OTHER DAY   furosemide 80 MG tablet Commonly known as:  LASIX Take 1 tablet (80 mg total) by mouth daily.   glucose blood test strip Use to test blood glucose 4 times daily (before meals and at bedtime). Dx:E11.9. INSULIN DEPENDENT   Insulin Glargine 100 UNIT/ML Solostar Pen Commonly known as:  LANTUS Inject 30 units into the skin at bedtime.Diagnosis  code:E11.8   Insulin Pen Needle 32G X 4 MM Misc Use to inject insulin 1 time a day   JANUVIA 25 MG tablet Generic drug:  sitaGLIPtin TAKE 1 TABLET BY MOUTH ONCE DAILY   levothyroxine 25 MCG tablet Commonly known as:  SYNTHROID, LEVOTHROID Take 1 tablet (25 mcg total) by mouth daily before breakfast.   losartan 25 MG tablet Commonly known as:  COZAAR Take 0.5 tablets (12.5 mg total) by mouth 2 (two) times daily. What changed:  See the new instructions.   metolazone 2.5 MG tablet Commonly known as:  ZAROXOLYN Take 1 tablet (2.5 mg total) by mouth once a week. May take an additional tablet ONLY as directed by the CHF clinic.   neomycin-bacitracin-polymyxin Oint Commonly known as:  NEOSPORIN Apply 1 application topically 2 (two) times daily.   nitroGLYCERIN 0.4 MG SL tablet Commonly known as:  NITROSTAT Place 1 tablet (0.4  mg total) under the tongue every 5 (five) minutes as needed. For chest pain. What changed:    reasons to take this  additional instructions   potassium chloride SA 20 MEQ tablet Commonly known as:  K-DUR,KLOR-CON Take 1 tablet (20 mEq total) by mouth daily.   VITAMIN D PO Take 5,000 Units by mouth daily.        Acute coronary syndrome (MI, NSTEMI, STEMI, etc) this admission?: No.    Outstanding Labs/Studies   none  Duration of Discharge Encounter   Greater than 30 minutes including physician time.  Signed, Cecilie Kicks, NP 11/14/2017, 12:03 PM  EP Attending  Patient seen and examined. Agree with above. She is stable for DC home. followup as above.   Mikle Bosworth.D.

## 2017-11-14 NOTE — Discharge Instructions (Signed)
° ° °  Supplemental Discharge Instructions for  Pacemaker/Defibrillator Patients  Activity No heavy lifting or vigorous activity with your left/right arm for 6 to 8 weeks.  Do not raise your left/right arm above your head for one week.  Gradually raise your affected arm as drawn below.              11/17/17                    11/18/17                    11/19/17                   11/20/17 __  NO DRIVING for  1 week  ; you may begin driving on   07/27/76  .  WOUND CARE - Keep the wound area clean and dry.  Do not get this area wet for one week. No showers for one week; you may shower on  11/20/17  . - The tape/steri-strips on your wound will fall off; do not pull them off.  No bandage is needed on the site.  DO  NOT apply any creams, oils, or ointments to the wound area. - If you notice any drainage or discharge from the wound, any swelling or bruising at the site, or you develop a fever > 101? F after you are discharged home, call the office at once.  Special Instructions - You are still able to use cellular telephones; use the ear opposite the side where you have your pacemaker/defibrillator.  Avoid carrying your cellular phone near your device. - When traveling through airports, show security personnel your identification card to avoid being screened in the metal detectors.  Ask the security personnel to use the hand wand. - Avoid arc welding equipment, MRI testing (magnetic resonance imaging), TENS units (transcutaneous nerve stimulators).  Call the office for questions about other devices. - Avoid electrical appliances that are in poor condition or are not properly grounded. - Microwave ovens are safe to be near or to operate.   For small tear place antibiotic ointment on the area twice a day.  Do not put any on incision itself.

## 2017-11-14 NOTE — Progress Notes (Signed)
Discharge to home, transported by son. Discharge instructions/ activity limitations  And follow up appointments discussed with patient,verbalized understanding. Pacemaker site clean dry and intact,no s/sx of swelling bruising or hematoma noted.

## 2017-11-14 NOTE — Progress Notes (Signed)
Progress Note  Patient Name: [name] Date of Encounter: 11/14/2017  Primary Cardiologist: Glori Bickers, MD   Subjective   No chest pain or shortness of breath  Inpatient Medications    Scheduled Meds: . amiodarone  100 mg Oral Daily  . aspirin EC  81 mg Oral Daily  . atorvastatin  80 mg Oral Daily  . digoxin  0.0625 mg Oral Daily  . furosemide  80 mg Oral Daily  . levothyroxine  25 mcg Oral QAC breakfast  . linagliptin  5 mg Oral Q breakfast  . losartan  12.5 mg Oral BID  . [START ON 11/20/2017] metolazone  2.5 mg Oral Weekly  . potassium chloride SA  20 mEq Oral Daily   Continuous Infusions:  PRN Meds: acetaminophen, ondansetron (ZOFRAN) IV   Vital Signs    Vitals:   11/14/17 0005 11/14/17 0314 11/14/17 0339 11/14/17 0839  BP: 116/63  (!) 104/59 109/70  Pulse: 64  (!) 53 66  Resp: 19  19   Temp: 97.9 F (36.6 C)  98 F (36.7 C)   TempSrc: Oral  Oral   SpO2: 93%  95%   Weight:  87.6 kg    Height:        Intake/Output Summary (Last 24 hours) at 11/14/2017 1123 Last data filed at 11/14/2017 0953 Gross per 24 hour  Intake 810 ml  Output -  Net 810 ml   Filed Weights   11/13/17 0937 11/13/17 1500 11/14/17 0314  Weight: 85.3 kg 85.8 kg 87.6 kg    Telemetry    Normal sinus rhythm with AV sequential pacing- Personally Reviewed  ECG    AV sequential pacing- Personally Reviewed  Physical Exam   GEN: No acute distress.   Neck: No JVD Cardiac: RRR, no murmurs, rubs, or gallops.  Respiratory: Clear to auscultation bilaterally.  No significant hematoma GI: Soft, nontender, non-distended  MS: No edema; No deformity. Neuro:  Nonfocal  Psych: Normal affect   Labs    Chemistry Recent Labs  Lab 11/13/17 1006  NA 142  K 3.7  CL 106  CO2 27  GLUCOSE 110*  BUN 33*  CREATININE 1.57*  CALCIUM 9.2  GFRNONAA 35*  GFRAA 40*  ANIONGAP 9     HematologyNo results for input(s): WBC, RBC, HGB, HCT, MCV, MCH, MCHC, RDW, PLT in the last 168  hours.  Cardiac EnzymesNo results for input(s): TROPONINI in the last 168 hours. No results for input(s): TROPIPOC in the last 168 hours.   BNPNo results for input(s): BNP, PROBNP in the last 168 hours.   DDimer No results for input(s): DDIMER in the last 168 hours.   Radiology    Dg Chest 2 View  Result Date: 11/14/2017 CLINICAL DATA:  Cardiac device in place EXAM: CHEST - 2 VIEW COMPARISON:  12/02/2013 FINDINGS: Biventricular pacer from the left with ICD component. Lead positioning is stable from fluoroscopy in 2015. Prior median sternotomy with changes of CABG and mitral valve repair. Streaky opacities at the bases, likely atelectasis. No Kerley lines, effusion, or pneumothorax. IMPRESSION: 1. Biventricular pacer/ICD with stable lead positioning compared to 2015 fluoroscopy. 2. Stable cardiomegaly. 3. Atelectatic opacities at the bases. Electronically Signed   By: Monte Fantasia M.D.   On: 11/14/2017 08:56    Cardiac Studies   None  Patient Profile     62 y.o. female admitted for ICD upgrade.  Assessment & Plan    1.  Biventricular ICD -she is status post upgrade with insertion  of a His bundle lead in the LV port.  Interrogation of her device under my direct supervision demonstrates normal device function.  Chest x-ray looks good.  She is stable for discharge home with usual follow-up.  She will continue her home medications. 2.  Chronic systolic heart failure --she appears to be euvolemic.  For questions or updates, please contact [place] Please consult [email or web address] for contact info under Cardiology/STEMI.      Signed, Cristopher Peru, MD  11/14/2017, 11:23 AM  Patient ID: [name], female   DOB: January 19, 1956, 62 y.o.   MRN: [id number]

## 2017-11-16 LAB — GLUCOSE, CAPILLARY: Glucose-Capillary: 147 mg/dL — ABNORMAL HIGH (ref 70–99)

## 2017-11-16 MED FILL — Lidocaine HCl Local Inj 1%: INTRAMUSCULAR | Qty: 40 | Status: AC

## 2017-11-17 ENCOUNTER — Encounter (HOSPITAL_COMMUNITY): Payer: Self-pay | Admitting: Internal Medicine

## 2017-11-18 DIAGNOSIS — E1122 Type 2 diabetes mellitus with diabetic chronic kidney disease: Secondary | ICD-10-CM | POA: Diagnosis not present

## 2017-11-18 DIAGNOSIS — Z01818 Encounter for other preprocedural examination: Secondary | ICD-10-CM | POA: Diagnosis not present

## 2017-11-18 DIAGNOSIS — I129 Hypertensive chronic kidney disease with stage 1 through stage 4 chronic kidney disease, or unspecified chronic kidney disease: Secondary | ICD-10-CM | POA: Diagnosis not present

## 2017-11-18 DIAGNOSIS — N184 Chronic kidney disease, stage 4 (severe): Secondary | ICD-10-CM | POA: Diagnosis not present

## 2017-11-19 ENCOUNTER — Ambulatory Visit: Payer: Self-pay

## 2017-11-23 ENCOUNTER — Ambulatory Visit: Payer: Self-pay

## 2017-11-23 ENCOUNTER — Telehealth: Payer: Self-pay | Admitting: Internal Medicine

## 2017-11-23 NOTE — Telephone Encounter (Signed)
New Message:      1. Has your device fired? No  2. Is you device beeping? No  3. Are you experiencing draining or swelling at device site? No  4. Are you calling to see if we received your device transmission? No  5. Have you passed out? No    Cathy from [facility] is calling to see if they are able to do a Leep procedure in the office or does it need to be in the hospital    Please route to [place]

## 2017-11-23 NOTE — Telephone Encounter (Signed)
LVM for Cathy at [facility] with information that a Leep procedure was ok to be done in office and there were no extra precautions that needed to be done. Direct number to device clinic for additional questions.

## 2017-11-25 ENCOUNTER — Ambulatory Visit (INDEPENDENT_AMBULATORY_CARE_PROVIDER_SITE_OTHER): Payer: Medicare Other | Admitting: Internal Medicine

## 2017-11-25 ENCOUNTER — Other Ambulatory Visit: Payer: Self-pay

## 2017-11-25 ENCOUNTER — Ambulatory Visit (INDEPENDENT_AMBULATORY_CARE_PROVIDER_SITE_OTHER): Payer: Medicare Other | Admitting: *Deleted

## 2017-11-25 ENCOUNTER — Encounter: Payer: Self-pay | Admitting: Internal Medicine

## 2017-11-25 VITALS — BP 113/60 | HR 73 | Temp 97.7°F | Ht 64.0 in | Wt 192.9 lb

## 2017-11-25 DIAGNOSIS — I5022 Chronic systolic (congestive) heart failure: Secondary | ICD-10-CM

## 2017-11-25 DIAGNOSIS — Z23 Encounter for immunization: Secondary | ICD-10-CM | POA: Diagnosis not present

## 2017-11-25 DIAGNOSIS — Z794 Long term (current) use of insulin: Secondary | ICD-10-CM | POA: Diagnosis not present

## 2017-11-25 DIAGNOSIS — T462X5S Adverse effect of other antidysrhythmic drugs, sequela: Secondary | ICD-10-CM | POA: Diagnosis not present

## 2017-11-25 DIAGNOSIS — E119 Type 2 diabetes mellitus without complications: Secondary | ICD-10-CM

## 2017-11-25 DIAGNOSIS — E032 Hypothyroidism due to medicaments and other exogenous substances: Secondary | ICD-10-CM | POA: Diagnosis not present

## 2017-11-25 DIAGNOSIS — Z87891 Personal history of nicotine dependence: Secondary | ICD-10-CM

## 2017-11-25 DIAGNOSIS — I255 Ischemic cardiomyopathy: Secondary | ICD-10-CM

## 2017-11-25 DIAGNOSIS — R011 Cardiac murmur, unspecified: Secondary | ICD-10-CM | POA: Diagnosis not present

## 2017-11-25 DIAGNOSIS — E1121 Type 2 diabetes mellitus with diabetic nephropathy: Secondary | ICD-10-CM

## 2017-11-25 DIAGNOSIS — Z7982 Long term (current) use of aspirin: Secondary | ICD-10-CM

## 2017-11-25 DIAGNOSIS — T462X1A Poisoning by other antidysrhythmic drugs, accidental (unintentional), initial encounter: Secondary | ICD-10-CM

## 2017-11-25 DIAGNOSIS — Z79899 Other long term (current) drug therapy: Secondary | ICD-10-CM | POA: Diagnosis not present

## 2017-11-25 DIAGNOSIS — Z833 Family history of diabetes mellitus: Secondary | ICD-10-CM | POA: Diagnosis not present

## 2017-11-25 LAB — GLUCOSE, CAPILLARY: GLUCOSE-CAPILLARY: 232 mg/dL — AB (ref 70–99)

## 2017-11-25 LAB — POCT GLYCOSYLATED HEMOGLOBIN (HGB A1C): HEMOGLOBIN A1C: 6.8 % — AB (ref 4.0–5.6)

## 2017-11-25 NOTE — Progress Notes (Signed)
   CC: Follow-up for DM type II  HPI:  [name] is a 62 y.o. female with past medical history as listed below, came to the clinic today for the DM type II follow-up. Please see encounter base assessment and plan for further details.  Past Medical History:  Diagnosis Date  . AICD (automatic cardioverter/defibrillator) present   . Chronic systolic heart failure ([place])    a. ICM b. ECHO (06/2013): EF 15%, severe MR (06/2013) c. RHC (10/2013): RA 5, RV 23/2/5, PA 25/6 (14), PCWP 12, Fick CO/CI: 3.2/1.7, PVR 0.7 WU, PA 45%, 47% and 55% (with levophed 5), vagal response during cath. d. On home milrinone.  . CKD (chronic kidney disease), stage III ([place])   . Coronary artery disease    a. s/p CABG x 5 with MV annuloplasty 2007   . Diabetes mellitus   . DVT (deep venous thrombosis) ([place]) 2009  . Hypertension   . Implantable defibrillator   medtronic   . Ischemic cardiomyopathy    a. s/p ICD. b. LHC (10/04/13): 1. Severe native CAD with all bypass grafts patent. c. CRT upgrade 12/2013.  [name] LBBB (left bundle branch block)   . Lipoma   . PAD (peripheral artery disease) ([place])   . PICC line infection    a. 01/2014 - PICC exchanged.  . Polysubstance abuse ([place])    history of  (cocaine, tobacco and ETOH)  . Presence of permanent cardiac pacemaker   . V-tach [facility])    Family history: Heart attack in mother DM in mother HTN in father DM in father  Social history: No smoking No alcohol No drugs  Review of Systems: Review of Systems  Constitutional: Negative for chills and fever.  Gastrointestinal: Negative for abdominal pain, nausea and vomiting.  Neurological: Negative for dizziness and headaches.     Physical Exam:  Vitals:   11/25/17 1337  BP: 113/60  Pulse: 73  Temp: 97.7 F (36.5 C)  TempSrc: Oral  SpO2: 97%  Weight: 192 lb 14.4 oz (87.5 kg)  Height: 5\' 4"  (1.626 m)   Physical Exam  Constitutional: She is oriented to person, place, and time. She appears  well-developed and well-nourished.  HENT:  Head: Normocephalic and atraumatic.  Eyes: EOM are normal.  Cardiovascular: Normal rate and regular rhythm.  Murmur heard. Abdominal: Soft. There is no tenderness.  Musculoskeletal: She exhibits no edema.  Neurological: She is alert and oriented to person, place, and time.  Psychiatric: She has a normal mood and affect.     Assessment & Plan:   See Encounters Tab for problem based charting.  Patient seen with Dr. Angelia Mould

## 2017-11-25 NOTE — Patient Instructions (Addendum)
It was our pleasure taking care of you in our clinic today. You were seen for Diabetes follow up. Your Hb A1c today is 6.8 which is great. Your home glucose records have been also within normal range. Please continue your Insuline 30 units at night and Januvia 25 mg daily as before and come back to clinic in 3 months for follow up.  We also talked about your thyroid medication. You have been off of that for past few months. Your thyroid test last time was normal. You can stay off of this medication for now but as you are taking Amiodarone and it can affect your thyroid again, we should check you in next visits. You also received Flu shot today.  Please let us know if you have any question or concern. Thanks, Dr. Myrtie Hawk

## 2017-11-25 NOTE — Assessment & Plan Note (Addendum)
Patient is currently on Lantus 30 unit at bedtime and Januvia 25 mg QD. Denies any dizziness, nausea. BG record at home was 100% within range, with lowest 93, and highest was 163.   -HbA1c today-->6.8 -Continue Lantus 30 units at bedtime and Januvia 25 mg daily -Follow-up in clinic in 3 months

## 2017-11-25 NOTE — Assessment & Plan Note (Addendum)
Patient has not filled her Levothyroxin and has been off on levothyroxine for few months. (She was not aware that she has a prescription for it). However her last TSH 3 weeks ago is normal.  She has been on amiodarone 100 mg daily.  Currently asymptomatic.  We agreed to give her off of levothyroxine but monitor her in the next visit, while on amiodarone.   -Holding levothyroxine for now -Follow-up in clinic in 3 months and endocrinologist as a scheduled before

## 2017-11-30 DIAGNOSIS — N72 Inflammatory disease of cervix uteri: Secondary | ICD-10-CM | POA: Diagnosis not present

## 2017-11-30 DIAGNOSIS — R87810 Cervical high risk human papillomavirus (HPV) DNA test positive: Secondary | ICD-10-CM | POA: Diagnosis not present

## 2017-11-30 NOTE — Progress Notes (Signed)
Internal Medicine Clinic Attending  I saw and evaluated the patient.  I personally confirmed the key portions of the history and exam documented by Dr. Masoudi and I reviewed pertinent patient test results.  The assessment, diagnosis, and plan were formulated together and I agree with the documentation in the resident's note. 

## 2017-12-02 LAB — CUP PACEART INCLINIC DEVICE CHECK
Battery Voltage: 3.01 V
Brady Statistic AP VP Percent: 4.89 %
Brady Statistic AS VP Percent: 93.59 %
Brady Statistic RA Percent Paced: 4.85 %
Brady Statistic RV Percent Paced: 95.33 %
Date Time Interrogation Session: 20190925201121
HighPow Impedance: 36 Ohm
HighPow Impedance: 47 Ohm
Implantable Lead Implant Date: 20111012
Implantable Lead Implant Date: 20151002
Implantable Lead Location: 753859
Implantable Lead Model: 3830
Implantable Lead Model: 5076
Implantable Pulse Generator Implant Date: 20190913
Lead Channel Impedance Value: 190 Ohm
Lead Channel Impedance Value: 304 Ohm
Lead Channel Impedance Value: 380 Ohm
Lead Channel Impedance Value: 437 Ohm
Lead Channel Impedance Value: 513 Ohm
Lead Channel Pacing Threshold Amplitude: 1 V
Lead Channel Pacing Threshold Pulse Width: 0.4 ms
Lead Channel Pacing Threshold Pulse Width: 0.4 ms
Lead Channel Sensing Intrinsic Amplitude: 0.875 mV
Lead Channel Setting Pacing Amplitude: 3.5 V
Lead Channel Setting Pacing Pulse Width: 0.03 ms
Lead Channel Setting Sensing Sensitivity: 0.45 mV
MDC IDC LEAD IMPLANT DT: 20190913
MDC IDC LEAD LOCATION: 753860
MDC IDC LEAD LOCATION: 753860
MDC IDC MSMT BATTERY REMAINING LONGEVITY: 50 mo
MDC IDC MSMT LEADCHNL LV PACING THRESHOLD AMPLITUDE: 2.125 V
MDC IDC MSMT LEADCHNL LV PACING THRESHOLD PULSEWIDTH: 1 ms
MDC IDC MSMT LEADCHNL RA SENSING INTR AMPL: 0.75 mV
MDC IDC MSMT LEADCHNL RV IMPEDANCE VALUE: 399 Ohm
MDC IDC MSMT LEADCHNL RV PACING THRESHOLD AMPLITUDE: 1.25 V
MDC IDC MSMT LEADCHNL RV SENSING INTR AMPL: 17.25 mV
MDC IDC MSMT LEADCHNL RV SENSING INTR AMPL: 20.375 mV
MDC IDC SET LEADCHNL LV PACING AMPLITUDE: 4 V
MDC IDC SET LEADCHNL LV PACING PULSEWIDTH: 1 ms
MDC IDC SET LEADCHNL RV PACING AMPLITUDE: 0.5 V
MDC IDC STAT BRADY AP VS PERCENT: 0.04 %
MDC IDC STAT BRADY AS VS PERCENT: 1.48 %

## 2017-12-02 NOTE — Progress Notes (Signed)
Wound check appointment. Steri-strips removed. Wound without redness or edema. Incision edges approximated, wound well healed. Normal device function. Thresholds, sensing, and impedances consistent with implant measurements. HBP lead in LV port, tested with use of rhythm strip - no changes noted from 6V to LOC. Device programmed at appropriate safety margins in RA/RV ports, and 4V (LV) for extra safety margin until 3 month visit. Histogram distribution appropriate for patient and level of activity. No mode switches or ventricular arrhythmias noted. Patient educated about wound care, arm mobility, lifting restrictions, shock plan. ROV in 3 months with SK.

## 2017-12-03 ENCOUNTER — Encounter (HOSPITAL_COMMUNITY): Payer: Self-pay

## 2017-12-03 ENCOUNTER — Other Ambulatory Visit: Payer: Self-pay | Admitting: Internal Medicine

## 2017-12-08 ENCOUNTER — Telehealth: Payer: Self-pay

## 2017-12-08 NOTE — Telephone Encounter (Signed)
Pt to send remote transmission in when she get home to evaluate leads

## 2017-12-08 NOTE — Telephone Encounter (Signed)
LVM for pt to call device clinic back. 

## 2017-12-08 NOTE — Telephone Encounter (Signed)
Spoke with pt regarding transmission pt stated that she was on her way home now informed pt that it would be tomorrow before someone called her back regarding the transmission pt voiced understanding

## 2017-12-08 NOTE — Telephone Encounter (Signed)
Forwarded to DC

## 2017-12-09 ENCOUNTER — Encounter (HOSPITAL_COMMUNITY): Payer: Self-pay | Admitting: Adult Health

## 2017-12-09 ENCOUNTER — Encounter (HOSPITAL_COMMUNITY): Payer: Self-pay | Admitting: *Deleted

## 2017-12-09 ENCOUNTER — Ambulatory Visit (HOSPITAL_COMMUNITY): Payer: Medicare Other | Attending: Internal Medicine

## 2017-12-09 DIAGNOSIS — I5022 Chronic systolic (congestive) heart failure: Secondary | ICD-10-CM | POA: Diagnosis not present

## 2017-12-09 LAB — BASIC METABOLIC PANEL
Anion gap: 10 (ref 5–15)
BUN: 29 mg/dL — AB (ref 6–20)
CALCIUM: 9.4 mg/dL (ref 8.9–10.3)
CO2: 26 mmol/L (ref 22–32)
CREATININE: 1.58 mg/dL — AB (ref 0.44–1.00)
Chloride: 103 mmol/L (ref 98–111)
GFR calc Af Amer: 40 mL/min — ABNORMAL LOW (ref >60)
GFR, EST NON AFRICAN AMERICAN: 35 mL/min — AB (ref >60)
GLUCOSE: 140 mg/dL — AB (ref 70–99)
Potassium: 3.6 mmol/L (ref 3.5–5.1)
Sodium: 139 mmol/L (ref 135–145)

## 2017-12-09 LAB — CBC
HCT: 36.7 % (ref 36.0–46.0)
Hemoglobin: 11.5 g/dL — ABNORMAL LOW (ref 12.0–15.0)
MCH: 29.2 pg (ref 26.0–34.0)
MCHC: 31.3 g/dL (ref 30.0–36.0)
MCV: 93.1 fL (ref 80.0–100.0)
PLATELETS: 167 10*3/uL (ref 150–400)
RBC: 3.94 MIL/uL (ref 3.87–5.11)
RDW: 15.1 % (ref 11.5–15.5)
WBC: 5.2 10*3/uL (ref 4.0–10.5)
nRBC: 0 % (ref 0.0–0.2)

## 2017-12-09 LAB — BRAIN NATRIURETIC PEPTIDE: B NATRIURETIC PEPTIDE 5: 412.8 pg/mL — AB (ref 0.0–100.0)

## 2017-12-09 NOTE — Progress Notes (Unsigned)
   Asked to evaluate before CPX.   S/P ICD Gen changed and lead revision on 9/13.   Since that time she has had increased shortness of breath with exertion.  No fever or chills.  Weight has been stable. 185-191 pounds. Taking all medications.   Volume status appears stable. Continue current diuretic regimen.   Hold off on CPX until she see EP on Friday. I discussed with Chanetta Marshall NP with EP.   Check BMET, CBC, and BNP today.   Plan to reschedule CPX.  Archit Leger NP-C  2:49 PM

## 2017-12-09 NOTE — Progress Notes (Signed)
Patient arrived with complaints of onset of shortness of breath since procedure involving device. Nira Conn, [facility] coordinator has already contacted [facility] cardiology for follow-up. Patient was contacted by Dr. Olin Pia office with appointment scheduled for Friday 10/11.  Will post-pone CPX until completion of office visit for evaluation of onset of shortness of breath per Amy Clegg and [employer] [facility] with agreement from patient.    Landis Martins, MS, ACSM-RCEP 12/09/2017 2:52 PM

## 2017-12-09 NOTE — Telephone Encounter (Signed)
Called to see if patient had attempted to send remote transmission, bc it has not been received. Patient states that she thought she sent it twice. Patient will send again once she returns home. Will call patient once it's received.

## 2017-12-09 NOTE — Telephone Encounter (Signed)
Remote transmission was not received. Appt scheduled w/Amber Lynnell Jude, NP on Friday @ 1220. Patient verbalized understanding.

## 2017-12-10 ENCOUNTER — Encounter (HOSPITAL_COMMUNITY): Payer: Self-pay

## 2017-12-10 NOTE — Progress Notes (Signed)
Electrophysiology Office Note Date: 12/10/2017  ID:  [name], [name] Mar 15, 1955, MRN [id number]  PCP: [name], MD Primary Cardiologist: Bensimhon Electrophysiologist: Caryl Comes  CC: ICD follow-up, shortness of breath  [name] is a 62 y.o. female seen today for Dr Caryl Comes. She underwent ICD implant originally in 2011 with CRT upgrade in 2015 (poor targets for LV lead noted at that time). She reached ERI earlier this year and His Bundle lead was placed at time of gen change. Since then she reports doing very poorly.  Her energy level is not good. She has significant shortness of breath. She thinks she was a 10/10 prior to procedure, now a 4/10.   She denies chest pain, palpitations, PND, orthopnea, nausea, vomiting, dizziness, syncope, edema, weight gain, or early satiety.  Device History: MDT single chamber ICD implanted 2011 for VT; gen change 2015 with upgrade to CRTD; gen change 2019 with His Lead placed in LV port   Past Medical History:  Diagnosis Date  . AICD (automatic cardioverter/defibrillator) present   . Chronic systolic heart failure ([place])    a. ICM b. ECHO (06/2013): EF 15%, severe MR (06/2013) c. RHC (10/2013): RA 5, RV 23/2/5, PA 25/6 (14), PCWP 12, Fick CO/CI: 3.2/1.7, PVR 0.7 WU, PA 45%, 47% and 55% (with levophed 5), vagal response during cath. d. On home milrinone.  . CKD (chronic kidney disease), stage III ([place])   . Coronary artery disease    a. s/p CABG x 5 with MV annuloplasty 2007   . Diabetes mellitus   . DVT (deep venous thrombosis) ([place]) 2009  . Hypertension   . Implantable defibrillator   medtronic   . Ischemic cardiomyopathy    a. s/p ICD. b. LHC (10/04/13): 1. Severe native CAD with all bypass grafts patent. c. CRT upgrade 12/2013.  [name] LBBB (left bundle branch block)   . Lipoma   . PAD (peripheral artery disease) ([place])   . PICC line infection    a. 01/2014 - PICC exchanged.  . Polysubstance abuse ([place])    history of  (cocaine, tobacco  and ETOH)  . Presence of permanent cardiac pacemaker   . V-tach [facility])    Past Surgical History:  Procedure Laterality Date  . ANKLE FRACTURE SURGERY Right    plate inserted  . ANKLE SURGERY Right    plate removed  . BI-VENTRICULAR IMPLANTABLE CARDIOVERTER DEFIBRILLATOR UPGRADE  12/02/2013   MDT [place] CRTD upgrade by Dr Caryl Comes  . BI-VENTRICULAR IMPLANTABLE CARDIOVERTER DEFIBRILLATOR UPGRADE N/A 12/02/2013   Procedure: BI-VENTRICULAR IMPLANTABLE CARDIOVERTER DEFIBRILLATOR UPGRADE;  Surgeon: Deboraha Sprang, MD;  Location: [facility] CATH LAB;  Service: Cardiovascular;  Laterality: N/A;  . CARDIAC CATHETERIZATION    . CARDIAC DEFIBRILLATOR PLACEMENT     2011, Followed By Dr. Caryl Comes  . COLONOSCOPY WITH PROPOFOL N/A 06/08/2014   Procedure: COLONOSCOPY WITH PROPOFOL;  Surgeon: Wilford Corner, MD;  Location: [facility] ENDOSCOPY;  Service: Endoscopy;  Laterality: N/A;  . COLONOSCOPY WITH PROPOFOL N/A 06/17/2017   Procedure: COLONOSCOPY WITH PROPOFOL;  Surgeon: Wilford Corner, MD;  Location: [place];  Service: Endoscopy;  Laterality: N/A;  . CORONARY ARTERY BYPASS GRAFT  10/2005  . ICD GENERATOR CHANGEOUT N/A 11/13/2017   Procedure: ICD GENERATOR CHANGEOUT;  Surgeon: Deboraha Sprang, MD;  Location: [place] CV LAB;  Service: Cardiovascular;  Laterality: N/A;  . LEAD REVISION/REPAIR N/A 11/13/2017   Procedure: LEAD REVISION/REPAIR;  Surgeon: Deboraha Sprang, MD;  Location: [place] CV LAB;  Service: Cardiovascular;  Laterality: N/A;  . LEFT AND RIGHT HEART CATHETERIZATION WITH CORONARY/GRAFT ANGIOGRAM N/A 03/17/2011   Procedure: LEFT AND RIGHT HEART CATHETERIZATION WITH Beatrix Fetters;  Surgeon: Jolaine Artist, MD;  Location: [facility] CATH LAB;  Service: Cardiovascular;  Laterality: N/A;  . LEFT AND RIGHT HEART CATHETERIZATION WITH CORONARY/GRAFT ANGIOGRAM N/A 10/04/2013   Procedure: LEFT AND RIGHT HEART CATHETERIZATION WITH Beatrix Fetters;  Surgeon: Jolaine Artist, MD;  Location: [facility] CATH  LAB;  Service: Cardiovascular;  Laterality: N/A;  . RIGHT HEART CATHETERIZATION N/A 05/23/2013   Procedure: RIGHT HEART CATH;  Surgeon: Jolaine Artist, MD;  Location: [facility] CATH LAB;  Service: Cardiovascular;  Laterality: N/A;  . RIGHT HEART CATHETERIZATION N/A 11/01/2013   Procedure: RIGHT HEART CATH;  Surgeon: Jolaine Artist, MD;  Location: [facility] CATH LAB;  Service: Cardiovascular;  Laterality: N/A;  . RIGHT HEART CATHETERIZATION N/A 11/25/2013   Procedure: RIGHT HEART CATH;  Surgeon: Jolaine Artist, MD;  Location: [facility] CATH LAB;  Service: Cardiovascular;  Laterality: N/A;  . RIGHT/LEFT HEART CATH AND CORONARY/GRAFT ANGIOGRAPHY N/A 03/10/2017   Procedure: RIGHT/LEFT HEART CATH AND CORONARY/GRAFT ANGIOGRAPHY;  Surgeon: Jolaine Artist, MD;  Location: [place] CV LAB;  Service: Cardiovascular;  Laterality: N/A;  . TEE WITHOUT CARDIOVERSION N/A 06/02/2013   Procedure: TRANSESOPHAGEAL ECHOCARDIOGRAM (TEE);  Surgeon: Larey Dresser, MD;  Location: [facility] ENDOSCOPY;  Service: Cardiovascular;  Laterality: N/A;    Current Outpatient Medications  Medication Sig Dispense Refill  . ACCU-CHEK FASTCLIX LANCETS MISC Use to test blood glucose 3 times daily. Dx:E11.9 102 each 5  . acetaminophen (TYLENOL) 500 MG tablet Take 500-1,000 mg by mouth every 6 (six) hours as needed for moderate pain or headache.     . allopurinol (ZYLOPRIM) 100 MG tablet Take 2 tablets (200 mg total) by mouth at bedtime.    [name] amiodarone (PACERONE) 200 MG tablet Take 0.5 tablets (100 mg total) by mouth daily. 15 tablet 3  . aspirin EC 81 MG tablet Take 81 mg by mouth every morning.     [name] atorvastatin (LIPITOR) 80 MG tablet TAKE 1 TABLET BY MOUTH ONCE DAILY (Patient taking differently: TAKE 80 MG BY MOUTH ONCE DAILY) 90 tablet 3  . Blood Glucose Monitoring Suppl (ACCU-CHEK NANO SMARTVIEW) W/DEVICE KIT Use to test blood glucose 2 times daily. Dx:E11.9 1 kit 0  . Cholecalciferol (VITAMIN D PO) Take 5,000 Units by mouth daily.     .  digoxin (LANOXIN) 0.125 MG tablet TAKE 0.0625 MG TABLET BY MOUTH EVERY OTHER DAY 15 tablet 3  . furosemide (LASIX) 80 MG tablet Take 1 tablet (80 mg total) by mouth daily. 45 tablet 3  . glucose blood (ACCU-CHEK SMARTVIEW) test strip Use to test blood glucose 4 times daily (before meals and at bedtime). Dx:E11.9. INSULIN DEPENDENT 100 each 2  . Insulin Glargine (LANTUS SOLOSTAR) 100 UNIT/ML Solostar Pen Inject 30 units into the skin at bedtime.Diagnosis code:E11.8 15 pen 3  . Insulin Pen Needle 32G X 4 MM MISC Use to inject insulin 1 time a day 100 each 3  . JANUVIA 25 MG tablet TAKE 1 TABLET BY MOUTH ONCE DAILY 30 tablet 0  . losartan (COZAAR) 25 MG tablet Take 0.5 tablets (12.5 mg total) by mouth 2 (two) times daily.    . metolazone (ZAROXOLYN) 2.5 MG tablet Take 1 tablet (2.5 mg total) by mouth once a week. May take an additional tablet ONLY as directed by the CHF clinic. 15 tablet 3  . neomycin-bacitracin-polymyxin (NEOSPORIN) OINT Apply 1  application topically 2 (two) times daily. 1 Tube 0  . nitroGLYCERIN (NITROSTAT) 0.4 MG SL tablet Place 1 tablet (0.4 mg total) under the tongue every 5 (five) minutes as needed. For chest pain. (Patient taking differently: Place 0.4 mg under the tongue every 5 (five) minutes as needed for chest pain. ) 25 tablet 11  . potassium chloride SA (K-DUR,KLOR-CON) 20 MEQ tablet Take 1 tablet (20 mEq total) by mouth daily. 90 tablet 3   No current facility-administered medications for this visit.     Allergies:   Patient has no known allergies.   Social History: Social History   Socioeconomic History  . Marital status: Widowed    Spouse name: Not on file  . Number of children: Not on file  . Years of education: Not on file  . Highest education level: Not on file  Occupational History  . Occupation: disable    Employer: UNEMPLOYED  Social Needs  . Financial resource strain: Not on file  . Food insecurity:    Worry: Patient refused    Inability: Patient  refused  . Transportation needs:    Medical: Patient refused    Non-medical: Patient refused  Tobacco Use  . Smoking status: Former Smoker    Years: 32.00    Types: Cigarettes    Last attempt to quit: 03/03/2011    Years since quitting: 6.7  . Smokeless tobacco: Never Used  . Tobacco comment: light smoker  Substance and Sexual Activity  . Alcohol use: No    Alcohol/week: 0.0 standard drinks  . Drug use: No    Comment: Has history of cocaine use, denies recent  . Sexual activity: Not Currently  Lifestyle  . Physical activity:    Days per week: Not on file    Minutes per session: Not on file  . Stress: Not on file  Relationships  . Social connections:    Talks on phone: Not on file    Gets together: Not on file    Attends religious service: Not on file    Active member of club or organization: Not on file    Attends meetings of clubs or organizations: Not on file    Relationship status: Not on file  . Intimate partner violence:    Fear of current or ex partner: Not on file    Emotionally abused: Not on file    Physically abused: Not on file    Forced sexual activity: Not on file  Other Topics Concern  . Not on file  Social History Narrative  . Not on file    Family History: Family History  Problem Relation Age of Onset  . Heart attack Mother        Died age 48  . Diabetes Maternal Grandmother   . Heart attack Cousin   . Heart attack Maternal Uncle      Review of Systems: All other systems reviewed and are otherwise negative except as noted above.   Physical Exam: VS:  There were no vitals taken for this visit. , BMI There is no height or weight on file to calculate BMI.  GEN- The patient is well appearing, alert and oriented x 3 today.   HEENT: normocephalic, atraumatic; sclera clear, conjunctiva pink; hearing intact; oropharynx clear; neck supple  Lungs- Clear to ausculation bilaterally, normal work of breathing.  No wheezes, rales, rhonchi Heart- Regular  rate and rhythm (paced) GI- soft, non-tender, non-distended, bowel sounds present  Extremities- no clubbing, cyanosis, or edema  MS-  no significant deformity or atrophy Skin- warm and dry, no rash or lesion; PPM pocket well healed Psych- euthymic mood, full affect Neuro- strength and sensation are intact  PPM Interrogation- reviewed in detail today,  See PACEART report  EKG:  EKG is ordered today. The ekg ordered today shows sinus rhythm, V pacing  Recent Labs: 11/04/2017: ALT 24; TSH 4.231 12/09/2017: B Natriuretic Peptide 412.8; BUN 29; Creatinine, Ser 1.58; Hemoglobin 11.5; Platelets 167; Potassium 3.6; Sodium 139   Wt Readings from Last 3 Encounters:  11/25/17 192 lb 14.4 oz (87.5 kg)  11/14/17 193 lb 1.6 oz (87.6 kg)  11/04/17 189 lb 9.6 oz (86 kg)     Other studies Reviewed: Additional studies/ records that were reviewed today include: AHF notes, Dr Olin Pia notes, hospital records  Assessment and Plan:  1.  Chronic systolic heart failure Euvolemic on exam Recent labs reviewed She is undergoing work up for heart/kidney [facility] At recent gen change, His Bundle lead was placed and she has had increased shortness of breath, exercise intolerance and fatigue Now NYHA Class III. His Bundle capture today septal down to loss of capture (cannot program unipolar with ICD) Her QRS complex was better with LV pacing (and high LV threshold) Will review with Dr Caryl Comes - I think for symptom improvement, we may need to use LV lead instead of His lead (would require another procedure). I discussed with her today. With her functional decline with His Bundle lead pacing, I think this is her best option.  Will obtain CXR today  2.  Mitral regurgitation S/p mitral valve annuloplasty at time of CABG Mild to moderate MR by echo 11/2016  3.  CKD, stage III Stable No change required today  4.  CAD No recent ischemic symptoms Continue current therapy    Current medicines are  reviewed at length with the patient today.   The patient does not have concerns regarding her medicines.  The following changes were made today:  none  Labs/ tests ordered today include: CXR No orders of the defined types were placed in this encounter.    Disposition:   Follow up with Dr Caryl Comes 1 week   Signed, Chanetta Marshall, NP 12/10/2017 6:16 PM  [place] [id number] [phone] (office) [phone] (fax)

## 2017-12-11 ENCOUNTER — Ambulatory Visit (INDEPENDENT_AMBULATORY_CARE_PROVIDER_SITE_OTHER): Payer: Medicare Other | Admitting: Nurse Practitioner

## 2017-12-11 ENCOUNTER — Ambulatory Visit
Admission: RE | Admit: 2017-12-11 | Discharge: 2017-12-11 | Disposition: A | Discharge status: Home / Self Care | Payer: Medicare Other | Source: Ambulatory Visit | Attending: Nurse Practitioner | Admitting: Nurse Practitioner

## 2017-12-11 VITALS — HR 64 | Ht 64.0 in | Wt 190.0 lb

## 2017-12-11 DIAGNOSIS — I340 Nonrheumatic mitral (valve) insufficiency: Secondary | ICD-10-CM

## 2017-12-11 DIAGNOSIS — I5022 Chronic systolic (congestive) heart failure: Secondary | ICD-10-CM

## 2017-12-11 DIAGNOSIS — I25810 Atherosclerosis of coronary artery bypass graft(s) without angina pectoris: Secondary | ICD-10-CM

## 2017-12-11 DIAGNOSIS — I255 Ischemic cardiomyopathy: Secondary | ICD-10-CM

## 2017-12-11 LAB — CUP PACEART INCLINIC DEVICE CHECK
Date Time Interrogation Session: 20191011122520
Implantable Lead Location: 753859
Implantable Lead Location: 753860
Implantable Lead Model: 3830
Implantable Pulse Generator Implant Date: 20190913
MDC IDC LEAD IMPLANT DT: 20111012
MDC IDC LEAD IMPLANT DT: 20151002
MDC IDC LEAD IMPLANT DT: 20190913
MDC IDC LEAD LOCATION: 753860

## 2017-12-11 NOTE — Patient Instructions (Addendum)
Medication Instructions:  Your physician recommends that you continue on your current medications as directed. Please refer to the Current Medication list given to you today.  -- If you need a refill on your cardiac medications before your next appointment, please call your pharmacy. --  Labwork: None ordered  Testing/Procedures: A chest x-ray takes a picture of the organs and structures inside the chest, including the heart, lungs, and blood vessels. This test can show several things, including, whether the heart is enlarges; whether fluid is building up in the lungs; and whether pacemaker / defibrillator leads are still in place.  Follow-Up: Remote monitoring is used to monitor your ICD from home. This monitoring reduces the number of office visits required to check your device to one time per year. It allows [place] to keep an eye on the functioning of your device to ensure it is working properly. You are scheduled for a device check from home on 01/11/18. You may send your transmission at any time that day. If you have a wireless device, the transmission will be sent automatically. After your physician reviews your transmission, you will receive a postcard with your next transmission date.   Your physician wants you to follow-up next week with Dr Caryl Comes    Thank you for choosing [facility]!!    Any Other Special Instructions Will Be Listed Below (If Applicable).

## 2017-12-14 ENCOUNTER — Telehealth: Payer: Self-pay

## 2017-12-14 NOTE — Telephone Encounter (Signed)
Notes recorded by Frederik Schmidt, RN on 12/14/2017 at 8:20 AM EDT The patient has been notified of the result and verbalized understanding. All questions (if any) were answered. Frederik Schmidt, RN 12/14/2017 8:20 AM

## 2017-12-14 NOTE — Telephone Encounter (Signed)
-----   Message from Patsey Berthold, NP sent at 12/13/2017  8:04 PM EDT ----- CXR with leads in stable position. Follow up with Dr Caryl Comes as scheduled.

## 2017-12-15 ENCOUNTER — Encounter: Payer: Self-pay | Admitting: Internal Medicine

## 2017-12-16 ENCOUNTER — Ambulatory Visit (INDEPENDENT_AMBULATORY_CARE_PROVIDER_SITE_OTHER): Payer: Medicare Other | Admitting: Internal Medicine

## 2017-12-16 ENCOUNTER — Other Ambulatory Visit: Payer: Self-pay | Admitting: Internal Medicine

## 2017-12-16 ENCOUNTER — Encounter: Payer: Self-pay | Admitting: Internal Medicine

## 2017-12-16 VITALS — BP 100/72 | HR 71 | Ht 64.0 in | Wt 187.6 lb

## 2017-12-16 DIAGNOSIS — Z95810 Presence of automatic (implantable) cardiac defibrillator: Secondary | ICD-10-CM | POA: Diagnosis not present

## 2017-12-16 DIAGNOSIS — E118 Type 2 diabetes mellitus with unspecified complications: Secondary | ICD-10-CM

## 2017-12-16 DIAGNOSIS — I5022 Chronic systolic (congestive) heart failure: Secondary | ICD-10-CM | POA: Diagnosis not present

## 2017-12-16 DIAGNOSIS — I255 Ischemic cardiomyopathy: Secondary | ICD-10-CM | POA: Diagnosis not present

## 2017-12-16 NOTE — Patient Instructions (Signed)
Medication Instructions:  Your physician recommends that you continue on your current medications as directed. Please refer to the Current Medication list given to you today.  Labwork: None ordered.  Testing/Procedures: None ordered.  Follow-Up: Your physician recommends that you schedule a follow-up appointment in:   4 weeks with Dr Caryl Comes  Remote monitoring is used to monitor your Pacemaker of ICD from home. This monitoring reduces the number of office visits required to check your device to one time per year. It allows [place] to keep an eye on the functioning of your device to ensure it is working properly. You are scheduled for a device check from home on 01/11/2018. You may send your transmission at any time that day. If you have a wireless device, the transmission will be sent automatically. After your physician reviews your transmission, you will receive a postcard with your next transmission date.    Any Other Special Instructions Will Be Listed Below (If Applicable).     If you need a refill on your cardiac medications before your next appointment, please call your pharmacy.

## 2017-12-16 NOTE — Progress Notes (Signed)
Patient Care Team: [name], MD as PCP - General Bensimhon, Shaune Pascal, MD as PCP - Cardiology (Cardiology) Deboraha Sprang, MD (Cardiology) Bensimhon, Shaune Pascal, MD (Cardiology)   HPI  [name] is a 62 y.o. 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  She has a history of inappropriate shocks 8/15  Started with amiodarone. She underwent CRT upgrade at that time. Catheterization 2013-January demonstrated patent grafts cardiac index 2.2 ejection fraction 10-15%  Electrocardiogram April 2013 demonstrated a QRS duration of 124 ms repeat today is 126.   She's been seen at [facility] for consideration of transplant evaluation of heart and possibly kidney.     DATE TEST    2/13 Cath   EF 10-15 %   9/15 LHC  Patent graft  2/17 Echo   EF 20-25% %   9/18 Echo  Ef 20-25%   1/19 LHC  3V CAD patent grafts   Date TSH LFTs Cr K  9/17  4.8 48      2/18   1.73   12/18 <0.015(1/19) 69 1.63   3/19   1.48 4.8  \ As her device was reaching ERI, we undertook his lead placement.  Unfortunately we ended up with septal pacing.  She is felt considerably more short of breath since his lead placement.  The last couple of weeks since she saw a AS-ANP she is feeling somewhat better.  No edema.   Records and Results Reviewed  CHF notes  Past Medical History:  Diagnosis Date  . AICD (automatic cardioverter/defibrillator) present   . Chronic systolic heart failure ([place])    a. ICM b. ECHO (06/2013): EF 15%, severe MR (06/2013) c. RHC (10/2013): RA 5, RV 23/2/5, PA 25/6 (14), PCWP 12, Fick CO/CI: 3.2/1.7, PVR 0.7 WU, PA 45%, 47% and 55% (with levophed 5), vagal response during cath. d. On home milrinone.  . CKD (chronic kidney disease), stage III ([place])   . Coronary artery disease    a. s/p CABG x 5 with MV annuloplasty 2007   . Diabetes mellitus   . DVT (deep venous  thrombosis) ([place]) 2009  . Hypertension   . Implantable defibrillator   medtronic   . Ischemic cardiomyopathy    a. s/p ICD. b. LHC (10/04/13): 1. Severe native CAD with all bypass grafts patent. c. CRT upgrade 12/2013.  [name] LBBB (left bundle branch block)   . Lipoma   . PAD (peripheral artery disease) ([place])   . PICC line infection    a. 01/2014 - PICC exchanged.  . Polysubstance abuse ([place])    history of  (cocaine, tobacco and ETOH)  . Presence of permanent cardiac pacemaker   . V-tach [facility])     Past Surgical History:  Procedure Laterality Date  . ANKLE FRACTURE SURGERY Right    plate inserted  . ANKLE SURGERY Right    plate removed  . BI-VENTRICULAR IMPLANTABLE CARDIOVERTER DEFIBRILLATOR UPGRADE  12/02/2013   [facility] [place] CRTD upgrade by Dr Caryl Comes  . BI-VENTRICULAR IMPLANTABLE CARDIOVERTER DEFIBRILLATOR UPGRADE N/A 12/02/2013   Procedure: BI-VENTRICULAR IMPLANTABLE CARDIOVERTER DEFIBRILLATOR UPGRADE;  Surgeon: Deboraha Sprang, MD;  Location[facility] CATH LAB;  Service: Cardiovascular;  Laterality: N/A;  . CARDIAC CATHETERIZATION    . CARDIAC DEFIBRILLATOR PLACEMENT     2011, Followed By Dr. Caryl Comes  . COLONOSCOPY WITH PROPOFOL N/A 06/08/2014  Procedure: COLONOSCOPY WITH PROPOFOL;  Surgeon: Wilford Corner, MD;  Location: [facility] ENDOSCOPY;  Service: Endoscopy;  Laterality: N/A;  . COLONOSCOPY WITH PROPOFOL N/A 06/17/2017   Procedure: COLONOSCOPY WITH PROPOFOL;  Surgeon: Wilford Corner, MD;  Location: [place];  Service: Endoscopy;  Laterality: N/A;  . CORONARY ARTERY BYPASS GRAFT  10/2005  . ICD GENERATOR CHANGEOUT N/A 11/13/2017   Procedure: ICD GENERATOR CHANGEOUT;  Surgeon: Deboraha Sprang, MD;  Location: [place] CV LAB;  Service: Cardiovascular;  Laterality: N/A;  . LEAD REVISION/REPAIR N/A 11/13/2017   Procedure: LEAD REVISION/REPAIR;  Surgeon: Deboraha Sprang, MD;  Location: [place] CV LAB;  Service: Cardiovascular;  Laterality: N/A;  . LEFT AND RIGHT HEART CATHETERIZATION WITH  CORONARY/GRAFT ANGIOGRAM N/A 03/17/2011   Procedure: LEFT AND RIGHT HEART CATHETERIZATION WITH Beatrix Fetters;  Surgeon: Jolaine Artist, MD;  Location: [facility] CATH LAB;  Service: Cardiovascular;  Laterality: N/A;  . LEFT AND RIGHT HEART CATHETERIZATION WITH CORONARY/GRAFT ANGIOGRAM N/A 10/04/2013   Procedure: LEFT AND RIGHT HEART CATHETERIZATION WITH Beatrix Fetters;  Surgeon: Jolaine Artist, MD;  Location: [facility] CATH LAB;  Service: Cardiovascular;  Laterality: N/A;  . RIGHT HEART CATHETERIZATION N/A 05/23/2013   Procedure: RIGHT HEART CATH;  Surgeon: Jolaine Artist, MD;  Location: [facility] CATH LAB;  Service: Cardiovascular;  Laterality: N/A;  . RIGHT HEART CATHETERIZATION N/A 11/01/2013   Procedure: RIGHT HEART CATH;  Surgeon: Jolaine Artist, MD;  Location: [facility] CATH LAB;  Service: Cardiovascular;  Laterality: N/A;  . RIGHT HEART CATHETERIZATION N/A 11/25/2013   Procedure: RIGHT HEART CATH;  Surgeon: Jolaine Artist, MD;  Location: [facility] CATH LAB;  Service: Cardiovascular;  Laterality: N/A;  . RIGHT/LEFT HEART CATH AND CORONARY/GRAFT ANGIOGRAPHY N/A 03/10/2017   Procedure: RIGHT/LEFT HEART CATH AND CORONARY/GRAFT ANGIOGRAPHY;  Surgeon: Jolaine Artist, MD;  Location: [place] CV LAB;  Service: Cardiovascular;  Laterality: N/A;  . TEE WITHOUT CARDIOVERSION N/A 06/02/2013   Procedure: TRANSESOPHAGEAL ECHOCARDIOGRAM (TEE);  Surgeon: Larey Dresser, MD;  Location: [facility] ENDOSCOPY;  Service: Cardiovascular;  Laterality: N/A;    Current Outpatient Medications  Medication Sig Dispense Refill  . ACCU-CHEK FASTCLIX LANCETS MISC Use to test blood glucose 3 times daily. Dx:E11.9 102 each 5  . acetaminophen (TYLENOL) 500 MG tablet Take 500-1,000 mg by mouth every 6 (six) hours as needed for moderate pain or headache.     . allopurinol (ZYLOPRIM) 100 MG tablet Take 2 tablets (200 mg total) by mouth at bedtime.    [name] amiodarone (PACERONE) 200 MG tablet Take 0.5 tablets (100 mg total) by mouth  daily. 15 tablet 3  . aspirin EC 81 MG tablet Take 81 mg by mouth every morning.     [name] atorvastatin (LIPITOR) 80 MG tablet TAKE 1 TABLET BY MOUTH ONCE DAILY (Patient taking differently: TAKE 80 MG BY MOUTH ONCE DAILY) 90 tablet 3  . Blood Glucose Monitoring Suppl (ACCU-CHEK NANO SMARTVIEW) W/DEVICE KIT Use to test blood glucose 2 times daily. Dx:E11.9 1 kit 0  . Cholecalciferol (VITAMIN D PO) Take 5,000 Units by mouth daily.     . digoxin (LANOXIN) 0.125 MG tablet TAKE 0.0625 MG TABLET BY MOUTH EVERY OTHER DAY 15 tablet 3  . furosemide (LASIX) 80 MG tablet Take 1 tablet (80 mg total) by mouth daily. 45 tablet 3  . glucose blood (ACCU-CHEK SMARTVIEW) test strip Use to test blood glucose 4 times daily (before meals and at bedtime). Dx:E11.9. INSULIN DEPENDENT 100 each 2  . Insulin Glargine (LANTUS SOLOSTAR) 100  UNIT/ML Solostar Pen Inject 30 units into the skin at bedtime.Diagnosis code:E11.8 15 pen 3  . Insulin Pen Needle 32G X 4 MM MISC Use to inject insulin 1 time a day 100 each 3  . JANUVIA 25 MG tablet TAKE 1 TABLET BY MOUTH ONCE DAILY 30 tablet 0  . losartan (COZAAR) 25 MG tablet Take 0.5 tablets (12.5 mg total) by mouth 2 (two) times daily.    . metolazone (ZAROXOLYN) 2.5 MG tablet Take 1 tablet (2.5 mg total) by mouth once a week. May take an additional tablet ONLY as directed by the CHF clinic. 15 tablet 3  . neomycin-bacitracin-polymyxin (NEOSPORIN) OINT Apply 1 application topically 2 (two) times daily. 1 Tube 0  . nitroGLYCERIN (NITROSTAT) 0.4 MG SL tablet Place 1 tablet (0.4 mg total) under the tongue every 5 (five) minutes as needed. For chest pain. (Patient taking differently: Place 0.4 mg under the tongue every 5 (five) minutes as needed for chest pain. ) 25 tablet 11  . potassium chloride SA (K-DUR,KLOR-CON) 20 MEQ tablet Take 1 tablet (20 mEq total) by mouth daily. 90 tablet 3   No current facility-administered medications for this visit.     No Known Allergies    Review of  Systems negative except from HPI and PMH  Physical Exam BP 100/72   Pulse 71   Ht _0  (1.626 m)   Wt 187 lb 9.6 oz (85.1 kg)   SpO2 95%   BMI 32.20 kg/m  Well developed and nourished in no acute distress HENT normal Neck supple with JVP-flat Clear Regular rate and rhythm, no murmurs or gallops Abd-soft with active BS No Clubbing cyanosis edema Skin-warm and dry A & Oriented  Grossly normal sensory and motor function   ECG demonstrates P synchronous pacing with a R wave in lead I and a r all S in lead V1   Nonischemic/valvular cardiomyopathy  Coronary artery disease Without symptoms of ischemia  LBBB  CHF chronic systolic class III  Ventricular Tachycardia   Hyperthyroidism  Hypertransaminasemia  Implantable defibrillator-Medtronic His Lead placement  High Risk Medication Surveillance   Euvolemic continue current meds  Borderline BP  Has spoke with Dr. Reine Just.  He is reluctant for [place] to try Entresto given her propensity towards hypotension.  We have increased her AV delay to try to improve ventricular filling.  In the event that this is unsuccessful, we have discussed device revision with the insertion of her LV lead at the expense of the procedure as well as to shorten battery life.  We spent more than 50% of our >25 min visit in face to face counseling regarding the above    More than 50% of 40 min was spent in counseling related to the above

## 2017-12-17 ENCOUNTER — Telehealth: Payer: Self-pay

## 2017-12-17 NOTE — Telephone Encounter (Signed)
LVM for pt regarding scheduling a 4 week follow up with Dr. Caryl Comes.

## 2017-12-21 NOTE — Telephone Encounter (Signed)
Pt scheduled for Nov 18th at 12:15. Pt may be a little late arriving.

## 2017-12-21 NOTE — Telephone Encounter (Signed)
  Patient was returning call and asked to be called back after 12pm today

## 2017-12-25 ENCOUNTER — Encounter: Payer: Self-pay | Admitting: *Deleted

## 2017-12-28 ENCOUNTER — Encounter: Payer: Self-pay | Admitting: Internal Medicine

## 2018-01-01 ENCOUNTER — Ambulatory Visit (INDEPENDENT_AMBULATORY_CARE_PROVIDER_SITE_OTHER): Payer: Medicare Other | Admitting: Internal Medicine

## 2018-01-01 ENCOUNTER — Encounter: Payer: Self-pay | Admitting: Internal Medicine

## 2018-01-01 VITALS — BP 108/60 | HR 76 | Ht 64.0 in | Wt 191.0 lb

## 2018-01-01 DIAGNOSIS — T462X1A Poisoning by other antidysrhythmic drugs, accidental (unintentional), initial encounter: Secondary | ICD-10-CM | POA: Diagnosis not present

## 2018-01-01 DIAGNOSIS — E032 Hypothyroidism due to medicaments and other exogenous substances: Secondary | ICD-10-CM | POA: Diagnosis not present

## 2018-01-01 DIAGNOSIS — I255 Ischemic cardiomyopathy: Secondary | ICD-10-CM

## 2018-01-01 LAB — TSH: TSH: 7.45 u[IU]/mL — AB (ref 0.35–4.50)

## 2018-01-01 LAB — T3, FREE: T3, Free: 2.5 pg/mL (ref 2.3–4.2)

## 2018-01-01 LAB — T4, FREE: FREE T4: 0.88 ng/dL (ref 0.60–1.60)

## 2018-01-01 NOTE — Patient Instructions (Signed)
Please stop at the lab.  Please return in 4 months.   Hypothyroidism Hypothyroidism is a disorder of the thyroid. The thyroid is a large gland that is located in the lower front of the neck. The thyroid releases hormones that control how the body works. With hypothyroidism, the thyroid does not make enough of these hormones. What are the causes? Causes of hypothyroidism may include:  Viral infections.  Pregnancy.  Your own defense system (immune system) attacking your thyroid.  Certain medicines.  Birth defects.  Past radiation treatments to your head or neck.  Past treatment with radioactive iodine.  Past surgical removal of part or all of your thyroid.  Problems with the gland that is located in the center of your brain (pituitary).  What are the signs or symptoms? Signs and symptoms of hypothyroidism may include:  Feeling as though you have no energy (lethargy).  Inability to tolerate cold.  Weight gain that is not explained by a change in diet or exercise habits.  Dry skin.  Coarse hair.  Menstrual irregularity.  Slowing of thought processes.  Constipation.  Sadness or depression.  How is this diagnosed? Your health care provider may diagnose hypothyroidism with blood tests and ultrasound tests. How is this treated? Hypothyroidism is treated with medicine that replaces the hormones that your body does not make. After you begin treatment, it may take several weeks for symptoms to go away. Follow these instructions at home:  Take medicines only as directed by your health care provider.  If you start taking any new medicines, tell your health care provider.  Keep all follow-up visits as directed by your health care provider. This is important. As your condition improves, your dosage needs may change. You will need to have blood tests regularly so that your health care provider can watch your condition. Contact a health care provider if:  Your symptoms do  not get better with treatment.  You are taking thyroid replacement medicine and: ? You sweat excessively. ? You have tremors. ? You feel anxious. ? You lose weight rapidly. ? You cannot tolerate heat. ? You have emotional swings. ? You have diarrhea. ? You feel weak. Get help right away if:  You develop chest pain.  You develop an irregular heartbeat.  You develop a rapid heartbeat. This information is not intended to replace advice given to you by your health care provider. Make sure you discuss any questions you have with your health care provider. Document Released: 02/17/2005 Document Revised: 07/26/2015 Document Reviewed: 07/05/2013 Elsevier Interactive Patient Education  2018 [employer].

## 2018-01-01 NOTE — Progress Notes (Signed)
Patient ID: [name], female   DOB: [birth year], 62 y.o.   MRN: [id number]    HPI  [name] is a 62 y.o.-year-old female, referred by Dr. Haroldine Laws, for management of amiodarone induced hypothyroidism.  Pt. has had mild subclinical hypothyroidism since at least 2015, however, in 07/2017 her TSH increased to higher than 10 and free T4 was low, so she started Levothyroxine 25 mcg.  She ran out of the medication 2 to 3 weeks before seeing her PCP in 11/2017.  At that time, her TFTs returned normal, so PCP did not refill her prescription for levothyroxine.  She now continues off the medication.  She does not have any hypothyroid complaints.  Of note, she is also on amiodarone 100 mg daily, started "many years ago".  When she was on LT4, she was taking it: - fasting - with water - separated by >30 min from b'fast  - no calcium, iron, PPIs, multivitamins   I reviewed pt's thyroid tests: Lab Results  Component Value Date   TSH 4.231 11/04/2017   TSH 10.570 (H) 07/13/2017   TSH 5.470 (H) 10/03/2016   TSH 3.320 04/11/2016   TSH 4.800 (H) 11/27/2015   TSH 4.459 08/01/2015   TSH 7.524 (H) 04/06/2015   TSH 3.637 08/30/2014   TSH 4.532 (H) 02/27/2014   TSH 5.370 (H) 11/25/2013   FREET4 1.06 11/04/2017   FREET4 0.75 (L) 07/29/2017   FREET4 1.44 10/09/2016   FREET4 1.44 11/27/2015   FREET4 0.99 08/01/2015   FREET4 1.29 02/05/2012   T3FREE 2.1 11/04/2017   Antithyroid antibodies: No results found for: THGAB No components found for: TPOAB  Pt denies: - weight gain - fatigue - cold intolerance - depression - constipation - dry skin - hair loss  Pt denies feeling nodules in neck, hoarseness, dysphagia/odynophagia, SOB with lying down.  She has no FH of thyroid disorders. No FH of thyroid cancer.  No h/o radiation tx to head or neck. No recent use of iodine supplements.  Pt. also has a history of CAD -s/p CABG 2007, ischemic CMP, CHF - has a  pacemaker-ICD.  ROS: Constitutional: no weight gain/loss, no fatigue, no subjective hyperthermia/hypothermia, + nocturia Eyes: + Blurry vision, no xerophthalmia ENT: no sore throat, + see HPI Cardiovascular: no CP/+ SOB/no palpitations/leg swelling Respiratory: no cough/+ SOB Gastrointestinal: no N/V/D/C Musculoskeletal: no muscle/joint aches Skin: no rashes, no hair loss Neurological: no tremors/numbness/tingling/dizziness Psychiatric: no depression/anxiety  Past Medical History:  Diagnosis Date  . AICD (automatic cardioverter/defibrillator) present   . Chronic systolic heart failure ([place])    a. ICM b. ECHO (06/2013): EF 15%, severe MR (06/2013) c. RHC (10/2013): RA 5, RV 23/2/5, PA 25/6 (14), PCWP 12, Fick CO/CI: 3.2/1.7, PVR 0.7 WU, PA 45%, 47% and 55% (with levophed 5), vagal response during cath. d. On home milrinone.  . CKD (chronic kidney disease), stage III ([place])   . Coronary artery disease    a. s/p CABG x 5 with MV annuloplasty 2007   . Diabetes mellitus   . DVT (deep venous thrombosis) ([place]) 2009  . Hypertension   . Implantable defibrillator   medtronic   . Ischemic cardiomyopathy    a. s/p ICD. b. LHC (10/04/13): 1. Severe native CAD with all bypass grafts patent. c. CRT upgrade 12/2013.  [name] LBBB (left bundle branch block)   . Lipoma   . PAD (peripheral artery disease) ([place])   . PICC line infection    a. 01/2014 - PICC exchanged.  [name]  Polysubstance abuse ([place])    history of  (cocaine, tobacco and ETOH)  . Presence of permanent cardiac pacemaker   . V-tach [facility])    Past Surgical History:  Procedure Laterality Date  . ANKLE FRACTURE SURGERY Right    plate inserted  . ANKLE SURGERY Right    plate removed  . BI-VENTRICULAR IMPLANTABLE CARDIOVERTER DEFIBRILLATOR UPGRADE  12/02/2013   MDT [place] CRTD upgrade by Dr Caryl Comes  . BI-VENTRICULAR IMPLANTABLE CARDIOVERTER DEFIBRILLATOR UPGRADE N/A 12/02/2013   Procedure: BI-VENTRICULAR IMPLANTABLE CARDIOVERTER DEFIBRILLATOR UPGRADE;   Surgeon: Deboraha Sprang, MD;  Location: [facility] CATH LAB;  Service: Cardiovascular;  Laterality: N/A;  . CARDIAC CATHETERIZATION    . CARDIAC DEFIBRILLATOR PLACEMENT     2011, Followed By Dr. Caryl Comes  . COLONOSCOPY WITH PROPOFOL N/A 06/08/2014   Procedure: COLONOSCOPY WITH PROPOFOL;  Surgeon: Wilford Corner, MD;  Location: [facility] ENDOSCOPY;  Service: Endoscopy;  Laterality: N/A;  . COLONOSCOPY WITH PROPOFOL N/A 06/17/2017   Procedure: COLONOSCOPY WITH PROPOFOL;  Surgeon: Wilford Corner, MD;  Location: [place];  Service: Endoscopy;  Laterality: N/A;  . CORONARY ARTERY BYPASS GRAFT  10/2005  . ICD GENERATOR CHANGEOUT N/A 11/13/2017   Procedure: ICD GENERATOR CHANGEOUT;  Surgeon: Deboraha Sprang, MD;  Location: [place] CV LAB;  Service: Cardiovascular;  Laterality: N/A;  . LEAD REVISION/REPAIR N/A 11/13/2017   Procedure: LEAD REVISION/REPAIR;  Surgeon: Deboraha Sprang, MD;  Location: [place] CV LAB;  Service: Cardiovascular;  Laterality: N/A;  . LEFT AND RIGHT HEART CATHETERIZATION WITH CORONARY/GRAFT ANGIOGRAM N/A 03/17/2011   Procedure: LEFT AND RIGHT HEART CATHETERIZATION WITH Beatrix Fetters;  Surgeon: Jolaine Artist, MD;  Location: [facility] CATH LAB;  Service: Cardiovascular;  Laterality: N/A;  . LEFT AND RIGHT HEART CATHETERIZATION WITH CORONARY/GRAFT ANGIOGRAM N/A 10/04/2013   Procedure: LEFT AND RIGHT HEART CATHETERIZATION WITH Beatrix Fetters;  Surgeon: Jolaine Artist, MD;  Location: [facility] CATH LAB;  Service: Cardiovascular;  Laterality: N/A;  . RIGHT HEART CATHETERIZATION N/A 05/23/2013   Procedure: RIGHT HEART CATH;  Surgeon: Jolaine Artist, MD;  Location: [facility] CATH LAB;  Service: Cardiovascular;  Laterality: N/A;  . RIGHT HEART CATHETERIZATION N/A 11/01/2013   Procedure: RIGHT HEART CATH;  Surgeon: Jolaine Artist, MD;  Location: [facility] CATH LAB;  Service: Cardiovascular;  Laterality: N/A;  . RIGHT HEART CATHETERIZATION N/A 11/25/2013   Procedure: RIGHT HEART CATH;   Surgeon: Jolaine Artist, MD;  Location: [facility] CATH LAB;  Service: Cardiovascular;  Laterality: N/A;  . RIGHT/LEFT HEART CATH AND CORONARY/GRAFT ANGIOGRAPHY N/A 03/10/2017   Procedure: RIGHT/LEFT HEART CATH AND CORONARY/GRAFT ANGIOGRAPHY;  Surgeon: Jolaine Artist, MD;  Location: [place] CV LAB;  Service: Cardiovascular;  Laterality: N/A;  . TEE WITHOUT CARDIOVERSION N/A 06/02/2013   Procedure: TRANSESOPHAGEAL ECHOCARDIOGRAM (TEE);  Surgeon: Larey Dresser, MD;  Location: [facility] ENDOSCOPY;  Service: Cardiovascular;  Laterality: N/A;   Social History   Socioeconomic History  . Marital status: Widowed    Spouse name: Not on file  . Number of children: Not on file  . Years of education: Not on file  . Highest education level: Not on file  Occupational History  . Occupation: disable    Employer: UNEMPLOYED  Social Needs  . Financial resource strain: Not on file  . Food insecurity:    Worry: Patient refused    Inability: Patient refused  . Transportation needs:    Medical: Patient refused    Non-medical: Patient refused  Tobacco Use  . Smoking status: Former Smoker  Years: 32.00    Types: Cigarettes    Last attempt to quit: 03/03/2011    Years since quitting: 6.8  . Smokeless tobacco: Never Used  . Tobacco comment: light smoker  Substance and Sexual Activity  . Alcohol use: No    Alcohol/week: 0.0 standard drinks  . Drug use: No    Comment: Has history of cocaine use, denies recent  . Sexual activity: Not Currently  Lifestyle  . Physical activity:    Days per week: Not on file    Minutes per session: Not on file  . Stress: Not on file  Relationships  . Social connections:    Talks on phone: Not on file    Gets together: Not on file    Attends religious service: Not on file    Active member of club or organization: Not on file    Attends meetings of clubs or organizations: Not on file    Relationship status: Not on file  . Intimate partner violence:    Fear of  current or ex partner: Not on file    Emotionally abused: Not on file    Physically abused: Not on file    Forced sexual activity: Not on file  Other Topics Concern  . Not on file  Social History Narrative  . Not on file   Current Outpatient Medications on File Prior to Visit  Medication Sig Dispense Refill  . ACCU-CHEK FASTCLIX LANCETS MISC Use to test blood glucose 3 times daily. Dx:E11.9 102 each 5  . acetaminophen (TYLENOL) 500 MG tablet Take 500-1,000 mg by mouth every 6 (six) hours as needed for moderate pain or headache.     . allopurinol (ZYLOPRIM) 100 MG tablet Take 2 tablets (200 mg total) by mouth at bedtime.    [name] amiodarone (PACERONE) 200 MG tablet Take 0.5 tablets (100 mg total) by mouth daily. 15 tablet 3  . aspirin EC 81 MG tablet Take 81 mg by mouth every morning.     [name] atorvastatin (LIPITOR) 80 MG tablet TAKE 1 TABLET BY MOUTH ONCE DAILY (Patient taking differently: TAKE 80 MG BY MOUTH ONCE DAILY) 90 tablet 3  . Blood Glucose Monitoring Suppl (ACCU-CHEK NANO SMARTVIEW) W/DEVICE KIT Use to test blood glucose 2 times daily. Dx:E11.9 1 kit 0  . Cholecalciferol (VITAMIN D PO) Take 5,000 Units by mouth daily.     . digoxin (LANOXIN) 0.125 MG tablet TAKE 0.0625 MG TABLET BY MOUTH EVERY OTHER DAY 15 tablet 3  . furosemide (LASIX) 80 MG tablet Take 1 tablet (80 mg total) by mouth daily. 45 tablet 3  . glucose blood (ACCU-CHEK SMARTVIEW) test strip Use to test blood glucose 4 times daily (before meals and at bedtime). Dx:E11.9. INSULIN DEPENDENT 100 each 2  . Insulin Glargine (LANTUS SOLOSTAR) 100 UNIT/ML Solostar Pen Inject 30 units into the skin at bedtime.Diagnosis code:E11.8 15 pen 3  . Insulin Pen Needle 32G X 4 MM MISC Use to inject insulin 1 time a day 100 each 3  . JANUVIA 25 MG tablet TAKE 1 TABLET BY MOUTH ONCE DAILY 90 tablet 1  . losartan (COZAAR) 25 MG tablet Take 0.5 tablets (12.5 mg total) by mouth 2 (two) times daily.    . metolazone (ZAROXOLYN) 2.5 MG tablet Take 1  tablet (2.5 mg total) by mouth once a week. May take an additional tablet ONLY as directed by the CHF clinic. 15 tablet 3  . neomycin-bacitracin-polymyxin (NEOSPORIN) OINT Apply 1 application topically 2 (two) times daily. 1  Tube 0  . nitroGLYCERIN (NITROSTAT) 0.4 MG SL tablet Place 1 tablet (0.4 mg total) under the tongue every 5 (five) minutes as needed. For chest pain. (Patient taking differently: Place 0.4 mg under the tongue every 5 (five) minutes as needed for chest pain. ) 25 tablet 11  . potassium chloride SA (K-DUR,KLOR-CON) 20 MEQ tablet Take 1 tablet (20 mEq total) by mouth daily. 90 tablet 3   No current facility-administered medications on file prior to visit.    No Known Allergies Family History  Problem Relation Age of Onset  . Heart attack Mother 56  . Heart attack Father   . CVA Father   . Diabetes Maternal Grandmother   . Heart attack Cousin   . Heart attack Maternal Uncle     PE: BP 108/60   Pulse 76   Ht '5\' 4"'$  (1.626 m) Comment: measured  Wt 191 lb (86.6 kg)   SpO2 97%   BMI 32.79 kg/m  Wt Readings from Last 3 Encounters:  01/01/18 191 lb (86.6 kg)  12/16/17 187 lb 9.6 oz (85.1 kg)  12/11/17 190 lb (86.2 kg)   Constitutional: overweight, in NAD Eyes: PERRLA, EOMI, no exophthalmos ENT: moist mucous membranes, no thyromegaly, no cervical lymphadenopathy Cardiovascular: RRR, No MRG Respiratory: CTA B Gastrointestinal: abdomen soft, NT, ND, BS+ Musculoskeletal: no deformities, strength intact in all 4 Skin: moist, warm, no rashes Neurological: no tremor with outstretched hands, DTR normal in all 4  ASSESSMENT: 1. Hypothyroidism - Possibly amiodarone induced  PLAN:  1. Patient with long-standing mild hypothyroidism, asymptomatic -She has had fluctuating TFTs in the past, with TSH level slightly above ULN, alternating with TSH levels in the high normal range.  However, in 07/2017, she had overt hypothyroidism with a TSH higher than 10 in the low free T4 so  she was started on 25 mcg of levothyroxine.  She took this until her prescription ran out in 3 months.  There was a break of 2 to 3 weeks before she saw her PCP and had labs drawn and TFTs were normal.  At that time, her prescription was not refilled. - she appears euthyroid and has no hypothyroid complaints.  We discussed about possible signs and symptoms of hypothyroidism: Weight gain, fatigue, sluggishness, constipation, hair loss, depression, but she does not experience any of these. - she does not appear to have a goiter, thyroid nodules, or neck compression symptoms - We reviewed together her TFTs and we decided to recheck her TFTs and restart her levothyroxine if absolutely needed -  indications for treatment:  TSH higher than 10  TSH lower than 10 with signs or symptoms consistent with hypothyroidism  Desire for pregnancy - We discussed about correct intake of levothyroxine if we need to start, fasting, with water, separated by at least 30 minutes from breakfast, and separated by more than 4 hours from calcium, iron, multivitamins, acid reflux medications (PPIs). - will check thyroid tests today: TSH, free T4 and will also add TPO and ATA antibodies to screen for Hashimoto's thyroiditis - If labs today are abnormal, she will need to return in ~6 weeks for repeat labs - Otherwise, I will see her back in 4 months  Component     Latest Ref Rng & Units 01/01/2018  TSH     0.35 - 4.50 uIU/mL 7.45 (H)  T4,Free(Direct)     0.60 - 1.60 ng/dL 0.88  Triiodothyronine,Free,Serum     2.3 - 4.2 pg/mL 2.5  Thyroglobulin Ab     <  or = 1 IU/mL <1  Thyroperoxidase Ab SerPl-aCnc     <9 IU/mL 1   TSH is slightly high, but, as mentioned above, there is no absolute indication to start thyroid hormones since free T4 and free T3 are normal and her antibodies are not elevated.  However, I will recheck her test when she comes back in 4 months.  Philemon Kingdom, MD PhD [facility]

## 2018-01-04 LAB — THYROGLOBULIN ANTIBODY: Thyroglobulin Ab: <1 IU/mL (ref <=1)

## 2018-01-04 LAB — THYROID PEROXIDASE ANTIBODY: THYROID PEROXIDASE ANTIBODY: 1 [IU]/mL (ref <9)

## 2018-01-08 ENCOUNTER — Ambulatory Visit (HOSPITAL_COMMUNITY)
Admission: RE | Admit: 2018-01-08 | Discharge: 2018-01-08 | Disposition: A | Discharge status: Home / Self Care | Payer: Medicare Other | Source: Ambulatory Visit | Attending: Internal Medicine | Admitting: Internal Medicine

## 2018-01-08 ENCOUNTER — Other Ambulatory Visit (HOSPITAL_COMMUNITY): Payer: Self-pay | Admitting: Internal Medicine

## 2018-01-08 VITALS — BP 111/62 | HR 79 | Wt 193.6 lb

## 2018-01-08 DIAGNOSIS — I5022 Chronic systolic (congestive) heart failure: Secondary | ICD-10-CM | POA: Insufficient documentation

## 2018-01-08 DIAGNOSIS — I340 Nonrheumatic mitral (valve) insufficiency: Secondary | ICD-10-CM | POA: Insufficient documentation

## 2018-01-08 DIAGNOSIS — I255 Ischemic cardiomyopathy: Secondary | ICD-10-CM | POA: Insufficient documentation

## 2018-01-08 DIAGNOSIS — Z79899 Other long term (current) drug therapy: Secondary | ICD-10-CM | POA: Diagnosis not present

## 2018-01-08 DIAGNOSIS — Z86718 Personal history of other venous thrombosis and embolism: Secondary | ICD-10-CM | POA: Insufficient documentation

## 2018-01-08 DIAGNOSIS — Z833 Family history of diabetes mellitus: Secondary | ICD-10-CM | POA: Insufficient documentation

## 2018-01-08 DIAGNOSIS — I252 Old myocardial infarction: Secondary | ICD-10-CM | POA: Insufficient documentation

## 2018-01-08 DIAGNOSIS — Z8249 Family history of ischemic heart disease and other diseases of the circulatory system: Secondary | ICD-10-CM | POA: Insufficient documentation

## 2018-01-08 DIAGNOSIS — Z6740 Type O blood, Rh positive: Secondary | ICD-10-CM | POA: Diagnosis not present

## 2018-01-08 DIAGNOSIS — Z95810 Presence of automatic (implantable) cardiac defibrillator: Secondary | ICD-10-CM | POA: Insufficient documentation

## 2018-01-08 DIAGNOSIS — Z7982 Long term (current) use of aspirin: Secondary | ICD-10-CM | POA: Diagnosis not present

## 2018-01-08 DIAGNOSIS — I447 Left bundle-branch block, unspecified: Secondary | ICD-10-CM | POA: Diagnosis not present

## 2018-01-08 DIAGNOSIS — I130 Hypertensive heart and chronic kidney disease with heart failure and stage 1 through stage 4 chronic kidney disease, or unspecified chronic kidney disease: Secondary | ICD-10-CM | POA: Insufficient documentation

## 2018-01-08 DIAGNOSIS — E669 Obesity, unspecified: Secondary | ICD-10-CM | POA: Diagnosis not present

## 2018-01-08 DIAGNOSIS — N183 Chronic kidney disease, stage 3 (moderate): Secondary | ICD-10-CM | POA: Diagnosis not present

## 2018-01-08 DIAGNOSIS — I2510 Atherosclerotic heart disease of native coronary artery without angina pectoris: Secondary | ICD-10-CM | POA: Diagnosis not present

## 2018-01-08 DIAGNOSIS — I472 Ventricular tachycardia: Secondary | ICD-10-CM | POA: Insufficient documentation

## 2018-01-08 DIAGNOSIS — E039 Hypothyroidism, unspecified: Secondary | ICD-10-CM | POA: Insufficient documentation

## 2018-01-08 DIAGNOSIS — K635 Polyp of colon: Secondary | ICD-10-CM | POA: Diagnosis not present

## 2018-01-08 DIAGNOSIS — E1122 Type 2 diabetes mellitus with diabetic chronic kidney disease: Secondary | ICD-10-CM | POA: Insufficient documentation

## 2018-01-08 DIAGNOSIS — Z951 Presence of aortocoronary bypass graft: Secondary | ICD-10-CM | POA: Insufficient documentation

## 2018-01-08 DIAGNOSIS — Z4502 Encounter for adjustment and management of automatic implantable cardiac defibrillator: Secondary | ICD-10-CM | POA: Diagnosis not present

## 2018-01-08 DIAGNOSIS — E1151 Type 2 diabetes mellitus with diabetic peripheral angiopathy without gangrene: Secondary | ICD-10-CM | POA: Diagnosis not present

## 2018-01-08 DIAGNOSIS — Z794 Long term (current) use of insulin: Secondary | ICD-10-CM | POA: Insufficient documentation

## 2018-01-08 MED ORDER — AMIODARONE HCL 200 MG PO TABS: 100.0000 mg | ORAL_TABLET | Freq: Every day | ORAL | 6 refills | Status: DC

## 2018-01-08 NOTE — Progress Notes (Signed)
Patient ID: [name], female   DOB: 19-Apr-1955, 62 y.o.   MRN: [id number]    Advanced Heart Failure Clinic Note  PCP: Dr. Posey Pronto  Primary HF:  Dr. Haroldine Laws  EP: Dr Beaulah Dinning Transplant : Dr Radene Knee   HPI: 62 y.o. female with history of HTN, DM2, past polysubstance abuse (ETOH, tobacco, cocaine), CAD s/p CABG x5 with MV annuloplasty (2007), ischemic cardiomyopathy s/p Medtronic CRT-D, chronic systolic HF EF 77-41%, CKD stage III and PAD. Blood type O+.  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 At time of cath LAD, LCX and RCA occluded. EF 45%. Had PCI of LAD followed by abrupt stent occlusion. Had repeat PCI of LAD and then underwent CABG x 5 with mitral valve annuloplasty in 2007.   Amitted 8/2-10/07/13 for syncope s/p ICD shock. Concern that VT was possibly from ischemia and taken for [facility]. Started on Amiodarone. Cardiac output dropped while in hospital so started on milrinone. Discharged home. Underwent CRT-D upgrade on 12/02/13  In 12/15, she was admitted with catheter infection. She was admitted and catheter was replaced.  Milrinone titrated off in January 2016 and has done reasonably well.   Currently undergoing heart-kidney transplant eval with Dr. Radene Knee at [facility]. Had cath 03/10/17 as part of process.   Underwent Generator change and lead revision 11/13/17. Goal was for HIS bundle pacing, but ended up with septal pacing. Was feeling bad after and saw Dr. Caryl Comes on 12/11/17. Since that time continues to feel very poorly with NYHA IIIB symptoms. Denies CP. + DOE with minimal exertion. Edema well controlled.    [place] [facility] 03/10/2017  Ao = 99/59 (75) LV = 104/22 RA = 11 RV = 57/13 PA = 61/28 (39) PCW = 24 (v = 35-40) Fick cardiac output/index = 5.5/2.9 Thermo CO/CI = 6.9/3.7 PVR = 2.2 WU FA sat = 96% PA sat = 65%, 64% Assessment: 1. Severe 3v CAD 2. All 5 grafts open 3. Moderately elevated biventricular pressures with normal cardiac  output. Prominent v-waves in PCW tracing c/w with significant MR  Echos: 05/19/2012  EF 15% 2/15 EF 15%, s/p MV repair with moderate-severe MR.  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 Lives in [place] with 2 sons([name] and [name]).   Review of systems complete and found to be negative unless listed in HPI.    Past Medical History:  Diagnosis Date  . AICD (automatic cardioverter/defibrillator) present   . Chronic systolic heart failure ([place])    a. ICM b. ECHO (06/2013): EF 15%, severe MR (06/2013) c. RHC (10/2013): RA 5, RV 23/2/5, PA 25/6 (14), PCWP 12, Fick CO/CI: 3.2/1.7, PVR 0.7 WU, PA 45%, 47% and 55% (with levophed 5), vagal response during cath. d. On home milrinone.  . CKD (chronic kidney disease), stage III ([place])   . Coronary artery disease    a. s/p CABG x 5 with MV annuloplasty 2007   . Diabetes mellitus   .  DVT (deep venous thrombosis) ([place]) 2009  . Hypertension   . Implantable defibrillator   medtronic   . Ischemic cardiomyopathy    a. s/p ICD. b. LHC (10/04/13): 1. Severe native CAD with all bypass grafts patent. c. CRT upgrade 12/2013.  [name] LBBB (left bundle branch block)   . Lipoma   . PAD (peripheral artery disease) ([place])   . PICC line infection    a. 01/2014 - PICC exchanged.  . Polysubstance abuse ([place])    history of  (cocaine, tobacco and ETOH)  . Presence of permanent cardiac pacemaker   . V-tach [facility])     Current Outpatient Medications  Medication Sig Dispense Refill  . ACCU-CHEK FASTCLIX LANCETS MISC Use to test  blood glucose 3 times daily. Dx:E11.9 102 each 5  . acetaminophen (TYLENOL) 500 MG tablet Take 500-1,000 mg by mouth every 6 (six) hours as needed for moderate pain or headache.     . allopurinol (ZYLOPRIM) 100 MG tablet Take 2 tablets (200 mg total) by mouth at bedtime.    [name] amiodarone (PACERONE) 200 MG tablet Take 0.5 tablets (100 mg total) by mouth daily. 15 tablet 3  . aspirin EC 81 MG tablet Take 81 mg by mouth every morning.     [name] atorvastatin (LIPITOR) 80 MG tablet TAKE 1 TABLET BY MOUTH ONCE DAILY (Patient taking differently: TAKE 80 MG BY MOUTH ONCE DAILY) 90 tablet 3  . Blood Glucose Monitoring Suppl (ACCU-CHEK NANO SMARTVIEW) W/DEVICE KIT Use to test blood glucose 2 times daily. Dx:E11.9 1 kit 0  . Cholecalciferol (VITAMIN D PO) Take 5,000 Units by mouth daily.     . digoxin (LANOXIN) 0.125 MG tablet TAKE 0.0625 MG TABLET BY MOUTH EVERY OTHER DAY 15 tablet 3  . furosemide (LASIX) 80 MG tablet Take 1 tablet (80 mg total) by mouth daily. 45 tablet 3  . glucose blood (ACCU-CHEK SMARTVIEW) test strip Use to test blood glucose 4 times daily (before meals and at bedtime). Dx:E11.9. INSULIN DEPENDENT 100 each 2  . Insulin Glargine (LANTUS SOLOSTAR) 100 UNIT/ML Solostar Pen Inject 30 units into the skin at bedtime.Diagnosis code:E11.8 15 pen 3  . Insulin Pen Needle 32G X 4 MM MISC Use to inject insulin 1 time a day 100 each 3  . JANUVIA 25 MG tablet TAKE 1 TABLET BY MOUTH ONCE DAILY 90 tablet 1  . losartan (COZAAR) 25 MG tablet Take 0.5 tablets (12.5 mg total) by mouth 2 (two) times daily.    . metolazone (ZAROXOLYN) 2.5 MG tablet Take 1 tablet (2.5 mg total) by mouth once a week. May take an additional tablet ONLY as directed by the CHF clinic. 15 tablet 3  . neomycin-bacitracin-polymyxin (NEOSPORIN) OINT Apply 1 application topically 2 (two) times daily. 1 Tube 0  . nitroGLYCERIN (NITROSTAT) 0.4 MG SL tablet Place 1 tablet (0.4 mg total) under the tongue every 5 (five) minutes as needed. For  chest pain. (Patient taking differently: Place 0.4 mg under the tongue every 5 (five) minutes as needed for chest pain. ) 25 tablet 11  . potassium chloride SA (K-DUR,KLOR-CON) 20 MEQ tablet Take 1 tablet (20 mEq total) by mouth daily. 90 tablet 3   No current facility-administered medications for this visit.    There were no vitals filed for this visit.   Wt Readings from Last 3 Encounters:  01/01/18 86.6 kg (191 lb)  12/16/17 85.1 kg (187 lb 9.6 oz)  12/11/17 86.2 kg (190 lb)     PHYSICAL EXAM: General:  Well appearing. No resp difficulty. HEENT: Normal Neck: Supple. JVP 5-6. Carotids 2+ bilat; no bruits. No thyromegaly or nodule noted. Cor: PMI nondisplaced. RRR, No M/G/R noted Lungs: CTAB, normal effort. Abdomen: Soft, non-tender, non-distended, no HSM. No bruits or masses. +BS  Extremities: No cyanosis, clubbing, or rash. R and LLE no edema.  Neuro: Alert & orientedx3, cranial nerves grossly intact. moves all 4 extremities w/o difficulty. Affect pleasant   ASSESSMENT & PLAN:  1. Chronic systolic CHF: Ischemic cardiomyopathy s/p Medtronic CRT-D, EF 20-25% ( Echo 04/2015). - Had LV lead revision in 9/19 and unable to place lead in CS so received HIS lead placement. Now much worse NYHA III-IIIB - I have discussed with Dr. Caryl Comes and Chanetta Marshall who will try to get her in for revision ASAP. - She follows in the transplant clinic at [facility] q 6 months with Dr. Radene Knee. Being worked up for heart-kidney transplant.  - She is blood type O - Echo 9/18 EF 25% RV ok  - Volume status ok - Continue lasix 80 mg daily and metolazone every Friday.  - ICD interrogation done personally in clinic. No VT/AF Activity level 4h/day. Fluid ok   - Intolerant entresto and carvedilol due to hypotension. Tried multiple times.  - Continue losartan 12.5 mg daily.  - [name] stopped with worsening renal function.  - Continue digoxin 0.0625 mg daily.  - Need repeat CPX.   2. CAD:  - Stable on recent cath  1/19 - No s/s of ischemia     - Continue ASA/statin  3. Mitral regurgitation:  - S/p mitral valve annuloplasty with CABG in 2007. Moderate to severe MR on echo in 4/15. TEE (06/2013) mitral regurgitation appeared severe, anatomy of repaired valve does not look suitable for MV clipping and would be high risk for MV replacement.  - Mild to moderate MR on recent echo 9/18  4. CKD stage III:  - Renal US - no hydronephrosis,. - Check BMET today.  - Follows with [facility]  5.  NSVT:  - Continue amiodarone 100 mg.daily  - TSH elelvated on 5/13. Started thyroid medications.  - No VT on device interrogation.  6. Obesity - There is no height or weight on file to calculate BMI.   7. PAD:  - Symptoms stable. - Right mid SFA stenosis per [place] 08/30/14.    8. Colon polyps - Had colonoscopy 06/08/14 with 12 polyps.  - Repeat colonoscopy pending  9. Hypothyroidism - TSH elevated 07/13/2017. PCP start synthroid. - Following with Endo. Seen last 01/01/18.   Glori Bickers, MD  4:53 PM

## 2018-01-08 NOTE — Patient Instructions (Signed)
Dr Olin Pia office will call you about an appointment  Your physician recommends that you schedule a follow-up appointment in: 1 month

## 2018-01-11 ENCOUNTER — Telehealth: Payer: Self-pay | Admitting: Cardiology

## 2018-01-11 ENCOUNTER — Encounter: Payer: Medicare Other | Admitting: *Deleted

## 2018-01-11 NOTE — Telephone Encounter (Signed)
Spoke with pt and reminded pt of remote transmission that is due today. Pt verbalized understanding.   

## 2018-01-13 ENCOUNTER — Encounter: Payer: Self-pay | Admitting: Cardiology

## 2018-01-18 ENCOUNTER — Ambulatory Visit (INDEPENDENT_AMBULATORY_CARE_PROVIDER_SITE_OTHER): Payer: Medicare Other | Admitting: Internal Medicine

## 2018-01-18 ENCOUNTER — Encounter: Payer: Self-pay | Admitting: Internal Medicine

## 2018-01-18 ENCOUNTER — Encounter: Payer: Self-pay | Admitting: *Deleted

## 2018-01-18 VITALS — BP 110/72 | HR 66 | Ht 64.0 in | Wt 185.2 lb

## 2018-01-18 DIAGNOSIS — I255 Ischemic cardiomyopathy: Secondary | ICD-10-CM | POA: Diagnosis not present

## 2018-01-18 DIAGNOSIS — E032 Hypothyroidism due to medicaments and other exogenous substances: Secondary | ICD-10-CM

## 2018-01-18 DIAGNOSIS — I472 Ventricular tachycardia: Secondary | ICD-10-CM | POA: Diagnosis not present

## 2018-01-18 DIAGNOSIS — I447 Left bundle-branch block, unspecified: Secondary | ICD-10-CM | POA: Diagnosis not present

## 2018-01-18 DIAGNOSIS — I428 Other cardiomyopathies: Secondary | ICD-10-CM | POA: Diagnosis not present

## 2018-01-18 DIAGNOSIS — Z95810 Presence of automatic (implantable) cardiac defibrillator: Secondary | ICD-10-CM

## 2018-01-18 DIAGNOSIS — T462X1A Poisoning by other antidysrhythmic drugs, accidental (unintentional), initial encounter: Secondary | ICD-10-CM

## 2018-01-18 DIAGNOSIS — I4729 Other ventricular tachycardia: Secondary | ICD-10-CM

## 2018-01-18 DIAGNOSIS — I2510 Atherosclerotic heart disease of native coronary artery without angina pectoris: Secondary | ICD-10-CM | POA: Diagnosis not present

## 2018-01-18 DIAGNOSIS — Z01812 Encounter for preprocedural laboratory examination: Secondary | ICD-10-CM | POA: Diagnosis not present

## 2018-01-18 NOTE — Progress Notes (Signed)
Patient Care Team: [name], MD as PCP - General Bensimhon, Shaune Pascal, MD as PCP - Cardiology (Cardiology) Deboraha Sprang, MD (Cardiology) Bensimhon, Shaune Pascal, MD (Cardiology)   HPI  [name] is a 62 y.o. 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  She has a history of inappropriate shocks 8/15  Started with amiodarone. She underwent CRT upgrade at that time. Catheterization 2013-January demonstrated patent grafts cardiac index 2.2 ejection fraction 10-15%  Electrocardiogram April 2013 demonstrated a QRS duration of 124 ms repeat today is 126.   She's been seen at [facility] for consideration of transplant evaluation of heart and possibly kidney.     DATE TEST    2/13 Cath   EF 10-15 %   9/15 LHC  Patent graft  2/17 Echo   EF 20-25% %   9/18 Echo  Ef 20-25%   1/19 LHC  3V CAD patent grafts   Date TSH LFTs Cr K  9/17  4.8 48      2/18   1.73   12/18 <0.015(1/19) 69 1.63   3/19   1.48 4.8  10/19 7.45 24 1.58 3.6        \ As her device was reaching ERI, we undertook His lead placement.  Unfortunately we ended up with septal pacing and she has been considerably more short of breath   NO edema  We have gone back and forth, and Dr. Reine Just at this point thinks it would be appropriate to re-hookup her LV lead not withstanding his high threshold.    Past Medical History:  Diagnosis Date  . AICD (automatic cardioverter/defibrillator) present   . Chronic systolic heart failure ([place])    a. ICM b. ECHO (06/2013): EF 15%, severe MR (06/2013) c. RHC (10/2013): RA 5, RV 23/2/5, PA 25/6 (14), PCWP 12, Fick CO/CI: 3.2/1.7, PVR 0.7 WU, PA 45%, 47% and 55% (with levophed 5), vagal response during cath. d. On home milrinone.  . CKD (chronic kidney disease), stage III ([place])   . Coronary artery disease    a. s/p CABG x 5 with MV annuloplasty 2007   .  Diabetes mellitus   . DVT (deep venous thrombosis) ([place]) 2009  . Hypertension   . Implantable defibrillator   medtronic   . Ischemic cardiomyopathy    a. s/p ICD. b. LHC (10/04/13): 1. Severe native CAD with all bypass grafts patent. c. CRT upgrade 12/2013.  [name] LBBB (left bundle branch block)   . Lipoma   . PAD (peripheral artery disease) ([place])   . PICC line infection    a. 01/2014 - PICC exchanged.  . Polysubstance abuse ([place])    history of  (cocaine, tobacco and ETOH)  . Presence of permanent cardiac pacemaker   . V-tach [facility])     Past Surgical History:  Procedure Laterality Date  . ANKLE FRACTURE SURGERY Right    plate inserted  . ANKLE SURGERY Right    plate removed  . BI-VENTRICULAR IMPLANTABLE CARDIOVERTER DEFIBRILLATOR UPGRADE  12/02/2013   [facility] [place] CRTD upgrade by Dr Caryl Comes  . BI-VENTRICULAR IMPLANTABLE CARDIOVERTER DEFIBRILLATOR UPGRADE N/A 12/02/2013   Procedure: BI-VENTRICULAR IMPLANTABLE CARDIOVERTER DEFIBRILLATOR UPGRADE;  Surgeon: Deboraha Sprang, MD;  Location: [facility] CATH LAB;  Service: Cardiovascular;  Laterality: N/A;  . CARDIAC CATHETERIZATION    . CARDIAC DEFIBRILLATOR PLACEMENT  2011, Followed By Dr. Caryl Comes  . COLONOSCOPY WITH PROPOFOL N/A 06/08/2014   Procedure: COLONOSCOPY WITH PROPOFOL;  Surgeon: Wilford Corner, MD;  Location: [facility] ENDOSCOPY;  Service: Endoscopy;  Laterality: N/A;  . COLONOSCOPY WITH PROPOFOL N/A 06/17/2017   Procedure: COLONOSCOPY WITH PROPOFOL;  Surgeon: Wilford Corner, MD;  Location: [place];  Service: Endoscopy;  Laterality: N/A;  . CORONARY ARTERY BYPASS GRAFT  10/2005  . ICD GENERATOR CHANGEOUT N/A 11/13/2017   Procedure: ICD GENERATOR CHANGEOUT;  Surgeon: Deboraha Sprang, MD;  Location: [place] CV LAB;  Service: Cardiovascular;  Laterality: N/A;  . LEAD REVISION/REPAIR N/A 11/13/2017   Procedure: LEAD REVISION/REPAIR;  Surgeon: Deboraha Sprang, MD;  Location: [place] CV LAB;  Service: Cardiovascular;  Laterality: N/A;  . LEFT  AND RIGHT HEART CATHETERIZATION WITH CORONARY/GRAFT ANGIOGRAM N/A 03/17/2011   Procedure: LEFT AND RIGHT HEART CATHETERIZATION WITH Beatrix Fetters;  Surgeon: Jolaine Artist, MD;  Location: [facility] CATH LAB;  Service: Cardiovascular;  Laterality: N/A;  . LEFT AND RIGHT HEART CATHETERIZATION WITH CORONARY/GRAFT ANGIOGRAM N/A 10/04/2013   Procedure: LEFT AND RIGHT HEART CATHETERIZATION WITH Beatrix Fetters;  Surgeon: Jolaine Artist, MD;  Location: [facility] CATH LAB;  Service: Cardiovascular;  Laterality: N/A;  . RIGHT HEART CATHETERIZATION N/A 05/23/2013   Procedure: RIGHT HEART CATH;  Surgeon: Jolaine Artist, MD;  Location: [facility] CATH LAB;  Service: Cardiovascular;  Laterality: N/A;  . RIGHT HEART CATHETERIZATION N/A 11/01/2013   Procedure: RIGHT HEART CATH;  Surgeon: Jolaine Artist, MD;  Location: [facility] CATH LAB;  Service: Cardiovascular;  Laterality: N/A;  . RIGHT HEART CATHETERIZATION N/A 11/25/2013   Procedure: RIGHT HEART CATH;  Surgeon: Jolaine Artist, MD;  Location: [facility] CATH LAB;  Service: Cardiovascular;  Laterality: N/A;  . RIGHT/LEFT HEART CATH AND CORONARY/GRAFT ANGIOGRAPHY N/A 03/10/2017   Procedure: RIGHT/LEFT HEART CATH AND CORONARY/GRAFT ANGIOGRAPHY;  Surgeon: Jolaine Artist, MD;  Location: [place] CV LAB;  Service: Cardiovascular;  Laterality: N/A;  . TEE WITHOUT CARDIOVERSION N/A 06/02/2013   Procedure: TRANSESOPHAGEAL ECHOCARDIOGRAM (TEE);  Surgeon: Larey Dresser, MD;  Location: [facility] ENDOSCOPY;  Service: Cardiovascular;  Laterality: N/A;    Current Outpatient Medications  Medication Sig Dispense Refill  . ACCU-CHEK FASTCLIX LANCETS MISC Use to test blood glucose 3 times daily. Dx:E11.9 102 each 5  . acetaminophen (TYLENOL) 500 MG tablet Take 500-1,000 mg by mouth every 6 (six) hours as needed for moderate pain or headache.     . allopurinol (ZYLOPRIM) 100 MG tablet Take 2 tablets (200 mg total) by mouth at bedtime.    [name] amiodarone (PACERONE) 200 MG tablet Take  0.5 tablets (100 mg total) by mouth daily. 15 tablet 6  . aspirin EC 81 MG tablet Take 81 mg by mouth every morning.     [name] atorvastatin (LIPITOR) 80 MG tablet TAKE 1 TABLET BY MOUTH ONCE DAILY 90 tablet 3  . Blood Glucose Monitoring Suppl (ACCU-CHEK NANO SMARTVIEW) W/DEVICE KIT Use to test blood glucose 2 times daily. Dx:E11.9 1 kit 0  . Cholecalciferol (VITAMIN D PO) Take 5,000 Units by mouth daily.     . digoxin (LANOXIN) 0.125 MG tablet TAKE 0.0625 MG TABLET BY MOUTH EVERY OTHER DAY 15 tablet 3  . furosemide (LASIX) 80 MG tablet Take 1 tablet (80 mg total) by mouth daily. 45 tablet 3  . glucose blood (ACCU-CHEK SMARTVIEW) test strip Use to test blood glucose 4 times daily (before meals and at bedtime). Dx:E11.9. INSULIN DEPENDENT 100 each 2  . Insulin  Glargine (LANTUS SOLOSTAR) 100 UNIT/ML Solostar Pen Inject 30 units into the skin at bedtime.Diagnosis code:E11.8 15 pen 3  . Insulin Pen Needle 32G X 4 MM MISC Use to inject insulin 1 time a day 100 each 3  . JANUVIA 25 MG tablet TAKE 1 TABLET BY MOUTH ONCE DAILY 90 tablet 1  . losartan (COZAAR) 25 MG tablet Take 0.5 tablets (12.5 mg total) by mouth 2 (two) times daily.    . metolazone (ZAROXOLYN) 2.5 MG tablet Take 1 tablet (2.5 mg total) by mouth once a week. May take an additional tablet ONLY as directed by the CHF clinic. 15 tablet 3  . neomycin-bacitracin-polymyxin (NEOSPORIN) OINT Apply 1 application topically 2 (two) times daily. 1 Tube 0  . nitroGLYCERIN (NITROSTAT) 0.4 MG SL tablet Place 1 tablet (0.4 mg total) under the tongue every 5 (five) minutes as needed. For chest pain. 25 tablet 11  . potassium chloride SA (K-DUR,KLOR-CON) 20 MEQ tablet Take 1 tablet (20 mEq total) by mouth daily. 90 tablet 3   No current facility-administered medications for this visit.     No Known Allergies    Review of Systems negative except from HPI and PMH  Physical Exam BP 110/72   Pulse 66   Ht _0  (1.626 m)   Wt 185 lb 3.2 oz (84 kg)    SpO2 94%   BMI 31.79 kg/m  Well developed and nourished in no acute distress HENT normal Neck supple with JVP-flat Clear Regular rate and rhythm, no murmurs or gallops Abd-soft with active BS No Clubbing cyanosis edema Skin-warm and dry A & Oriented  Grossly normal sensory and motor function    ECG demonstrates P-synchronous/ AV  pacing Upright QRS 1 and neg V1  Nonischemic/valvular cardiomyopathy  Coronary artery disease Without symptoms of ischemia  LBBB  CHF chronic systolic class III  Ventricular Tachycardia   Hyperthyroidism  Hypertransaminasemia  Implantable defibrillator-Medtronic His Lead placement  High Risk Medication Surveillance  With worsening functional status, we have elected to reuse her abandoned LV lead and remove her His lead  We have reviewed the benefits and risks of generator replacement.  These include but are not limited to lead fracture and infection.  The patient understands, agrees and is willing to proceed.        More than 50% of 40 min was spent in counseling related to the above

## 2018-01-18 NOTE — H&P (View-Only) (Signed)
Patient Care Team: [name], MD as PCP - General Bensimhon, Shaune Pascal, MD as PCP - Cardiology (Cardiology) Deboraha Sprang, MD (Cardiology) Bensimhon, Shaune Pascal, MD (Cardiology)   HPI  [name] is a 62 y.o. 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  She has a history of inappropriate shocks 8/15  Started with amiodarone. She underwent CRT upgrade at that time. Catheterization 2013-January demonstrated patent grafts cardiac index 2.2 ejection fraction 10-15%  Electrocardiogram April 2013 demonstrated a QRS duration of 124 ms repeat today is 126.   She's been seen at [facility] for consideration of transplant evaluation of heart and possibly kidney.     DATE TEST    2/13 Cath   EF 10-15 %   9/15 LHC  Patent graft  2/17 Echo   EF 20-25% %   9/18 Echo  Ef 20-25%   1/19 LHC  3V CAD patent grafts   Date TSH LFTs Cr K  9/17  4.8 48      2/18   1.73   12/18 <0.015(1/19) 69 1.63   3/19   1.48 4.8  10/19 7.45 24 1.58 3.6        \ As her device was reaching ERI, we undertook His lead placement.  Unfortunately we ended up with septal pacing and she has been considerably more short of breath   NO edema  We have gone back and forth, and Dr. Reine Just at this point thinks it would be appropriate to re-hookup her LV lead not withstanding his high threshold.    Past Medical History:  Diagnosis Date  . AICD (automatic cardioverter/defibrillator) present   . Chronic systolic heart failure ([place])    a. ICM b. ECHO (06/2013): EF 15%, severe MR (06/2013) c. RHC (10/2013): RA 5, RV 23/2/5, PA 25/6 (14), PCWP 12, Fick CO/CI: 3.2/1.7, PVR 0.7 WU, PA 45%, 47% and 55% (with levophed 5), vagal response during cath. d. On home milrinone.  . CKD (chronic kidney disease), stage III ([place])   . Coronary artery disease    a. s/p CABG x 5 with MV annuloplasty 2007   .  Diabetes mellitus   . DVT (deep venous thrombosis) ([place]) 2009  . Hypertension   . Implantable defibrillator   medtronic   . Ischemic cardiomyopathy    a. s/p ICD. b. LHC (10/04/13): 1. Severe native CAD with all bypass grafts patent. c. CRT upgrade 12/2013.  [name] LBBB (left bundle branch block)   . Lipoma   . PAD (peripheral artery disease) ([place])   . PICC line infection    a. 01/2014 - PICC exchanged.  . Polysubstance abuse ([place])    history of  (cocaine, tobacco and ETOH)  . Presence of permanent cardiac pacemaker   . V-tach [facility])     Past Surgical History:  Procedure Laterality Date  . ANKLE FRACTURE SURGERY Right    plate inserted  . ANKLE SURGERY Right    plate removed  . BI-VENTRICULAR IMPLANTABLE CARDIOVERTER DEFIBRILLATOR UPGRADE  12/02/2013   [facility] [place] CRTD upgrade by Dr Caryl Comes  . BI-VENTRICULAR IMPLANTABLE CARDIOVERTER DEFIBRILLATOR UPGRADE N/A 12/02/2013   Procedure: BI-VENTRICULAR IMPLANTABLE CARDIOVERTER DEFIBRILLATOR UPGRADE;  Surgeon: Deboraha Sprang, MD;  Location: [facility] CATH LAB;  Service: Cardiovascular;  Laterality: N/A;  . CARDIAC CATHETERIZATION    . CARDIAC DEFIBRILLATOR PLACEMENT  2011, Followed By Dr. Caryl Comes  . COLONOSCOPY WITH PROPOFOL N/A 06/08/2014   Procedure: COLONOSCOPY WITH PROPOFOL;  Surgeon: Wilford Corner, MD;  Location: [facility] ENDOSCOPY;  Service: Endoscopy;  Laterality: N/A;  . COLONOSCOPY WITH PROPOFOL N/A 06/17/2017   Procedure: COLONOSCOPY WITH PROPOFOL;  Surgeon: Wilford Corner, MD;  Location: [place];  Service: Endoscopy;  Laterality: N/A;  . CORONARY ARTERY BYPASS GRAFT  10/2005  . ICD GENERATOR CHANGEOUT N/A 11/13/2017   Procedure: ICD GENERATOR CHANGEOUT;  Surgeon: Deboraha Sprang, MD;  Location: [place] CV LAB;  Service: Cardiovascular;  Laterality: N/A;  . LEAD REVISION/REPAIR N/A 11/13/2017   Procedure: LEAD REVISION/REPAIR;  Surgeon: Deboraha Sprang, MD;  Location: [place] CV LAB;  Service: Cardiovascular;  Laterality: N/A;  . LEFT  AND RIGHT HEART CATHETERIZATION WITH CORONARY/GRAFT ANGIOGRAM N/A 03/17/2011   Procedure: LEFT AND RIGHT HEART CATHETERIZATION WITH Beatrix Fetters;  Surgeon: Jolaine Artist, MD;  Location: [facility] CATH LAB;  Service: Cardiovascular;  Laterality: N/A;  . LEFT AND RIGHT HEART CATHETERIZATION WITH CORONARY/GRAFT ANGIOGRAM N/A 10/04/2013   Procedure: LEFT AND RIGHT HEART CATHETERIZATION WITH Beatrix Fetters;  Surgeon: Jolaine Artist, MD;  Location: [facility] CATH LAB;  Service: Cardiovascular;  Laterality: N/A;  . RIGHT HEART CATHETERIZATION N/A 05/23/2013   Procedure: RIGHT HEART CATH;  Surgeon: Jolaine Artist, MD;  Location: [facility] CATH LAB;  Service: Cardiovascular;  Laterality: N/A;  . RIGHT HEART CATHETERIZATION N/A 11/01/2013   Procedure: RIGHT HEART CATH;  Surgeon: Jolaine Artist, MD;  Location: [facility] CATH LAB;  Service: Cardiovascular;  Laterality: N/A;  . RIGHT HEART CATHETERIZATION N/A 11/25/2013   Procedure: RIGHT HEART CATH;  Surgeon: Jolaine Artist, MD;  Location: [facility] CATH LAB;  Service: Cardiovascular;  Laterality: N/A;  . RIGHT/LEFT HEART CATH AND CORONARY/GRAFT ANGIOGRAPHY N/A 03/10/2017   Procedure: RIGHT/LEFT HEART CATH AND CORONARY/GRAFT ANGIOGRAPHY;  Surgeon: Jolaine Artist, MD;  Location: [place] CV LAB;  Service: Cardiovascular;  Laterality: N/A;  . TEE WITHOUT CARDIOVERSION N/A 06/02/2013   Procedure: TRANSESOPHAGEAL ECHOCARDIOGRAM (TEE);  Surgeon: Larey Dresser, MD;  Location: [facility] ENDOSCOPY;  Service: Cardiovascular;  Laterality: N/A;    Current Outpatient Medications  Medication Sig Dispense Refill  . ACCU-CHEK FASTCLIX LANCETS MISC Use to test blood glucose 3 times daily. Dx:E11.9 102 each 5  . acetaminophen (TYLENOL) 500 MG tablet Take 500-1,000 mg by mouth every 6 (six) hours as needed for moderate pain or headache.     . allopurinol (ZYLOPRIM) 100 MG tablet Take 2 tablets (200 mg total) by mouth at bedtime.    [name] amiodarone (PACERONE) 200 MG tablet Take  0.5 tablets (100 mg total) by mouth daily. 15 tablet 6  . aspirin EC 81 MG tablet Take 81 mg by mouth every morning.     [name] atorvastatin (LIPITOR) 80 MG tablet TAKE 1 TABLET BY MOUTH ONCE DAILY 90 tablet 3  . Blood Glucose Monitoring Suppl (ACCU-CHEK NANO SMARTVIEW) W/DEVICE KIT Use to test blood glucose 2 times daily. Dx:E11.9 1 kit 0  . Cholecalciferol (VITAMIN D PO) Take 5,000 Units by mouth daily.     . digoxin (LANOXIN) 0.125 MG tablet TAKE 0.0625 MG TABLET BY MOUTH EVERY OTHER DAY 15 tablet 3  . furosemide (LASIX) 80 MG tablet Take 1 tablet (80 mg total) by mouth daily. 45 tablet 3  . glucose blood (ACCU-CHEK SMARTVIEW) test strip Use to test blood glucose 4 times daily (before meals and at bedtime). Dx:E11.9. INSULIN DEPENDENT 100 each 2  . Insulin  Glargine (LANTUS SOLOSTAR) 100 UNIT/ML Solostar Pen Inject 30 units into the skin at bedtime.Diagnosis code:E11.8 15 pen 3  . Insulin Pen Needle 32G X 4 MM MISC Use to inject insulin 1 time a day 100 each 3  . JANUVIA 25 MG tablet TAKE 1 TABLET BY MOUTH ONCE DAILY 90 tablet 1  . losartan (COZAAR) 25 MG tablet Take 0.5 tablets (12.5 mg total) by mouth 2 (two) times daily.    . metolazone (ZAROXOLYN) 2.5 MG tablet Take 1 tablet (2.5 mg total) by mouth once a week. May take an additional tablet ONLY as directed by the CHF clinic. 15 tablet 3  . neomycin-bacitracin-polymyxin (NEOSPORIN) OINT Apply 1 application topically 2 (two) times daily. 1 Tube 0  . nitroGLYCERIN (NITROSTAT) 0.4 MG SL tablet Place 1 tablet (0.4 mg total) under the tongue every 5 (five) minutes as needed. For chest pain. 25 tablet 11  . potassium chloride SA (K-DUR,KLOR-CON) 20 MEQ tablet Take 1 tablet (20 mEq total) by mouth daily. 90 tablet 3   No current facility-administered medications for this visit.     No Known Allergies    Review of Systems negative except from HPI and PMH  Physical Exam BP 110/72   Pulse 66   Ht _0  (1.626 m)   Wt 185 lb 3.2 oz (84 kg)    SpO2 94%   BMI 31.79 kg/m  Well developed and nourished in no acute distress HENT normal Neck supple with JVP-flat Clear Regular rate and rhythm, no murmurs or gallops Abd-soft with active BS No Clubbing cyanosis edema Skin-warm and dry A & Oriented  Grossly normal sensory and motor function    ECG demonstrates P-synchronous/ AV  pacing Upright QRS 1 and neg V1  Nonischemic/valvular cardiomyopathy  Coronary artery disease Without symptoms of ischemia  LBBB  CHF chronic systolic class III  Ventricular Tachycardia   Hyperthyroidism  Hypertransaminasemia  Implantable defibrillator-Medtronic His Lead placement  High Risk Medication Surveillance  With worsening functional status, we have elected to reuse her abandoned LV lead and remove her His lead  We have reviewed the benefits and risks of generator replacement.  These include but are not limited to lead fracture and infection.  The patient understands, agrees and is willing to proceed.        More than 50% of 40 min was spent in counseling related to the above

## 2018-01-18 NOTE — Patient Instructions (Signed)
Your physician recommends that you continue on your current medications as directed. Please refer to the Current Medication list given to you today.  Your physician recommends that you return for lab work in:  [place] , DIG , [place] CBC    Your physician recommends that you schedule a follow-up appointment in:  [place] AFTER 02/03/18

## 2018-01-19 ENCOUNTER — Other Ambulatory Visit: Payer: Self-pay

## 2018-01-19 DIAGNOSIS — T462X1A Poisoning by other antidysrhythmic drugs, accidental (unintentional), initial encounter: Secondary | ICD-10-CM

## 2018-01-19 DIAGNOSIS — I2510 Atherosclerotic heart disease of native coronary artery without angina pectoris: Secondary | ICD-10-CM

## 2018-01-19 DIAGNOSIS — I255 Ischemic cardiomyopathy: Secondary | ICD-10-CM

## 2018-01-19 DIAGNOSIS — E032 Hypothyroidism due to medicaments and other exogenous substances: Secondary | ICD-10-CM

## 2018-01-19 DIAGNOSIS — Z01812 Encounter for preprocedural laboratory examination: Secondary | ICD-10-CM

## 2018-01-19 LAB — CBC
HEMATOCRIT: 39.8 % (ref 34.0–46.6)
Hemoglobin: 12.9 g/dL (ref 11.1–15.9)
MCH: 29.1 pg (ref 26.6–33.0)
MCHC: 32.4 g/dL (ref 31.5–35.7)
MCV: 90 fL (ref 79–97)
PLATELETS: 177 10*3/uL (ref 150–450)
RBC: 4.44 x10E6/uL (ref 3.77–5.28)
RDW: 14.8 % (ref 12.3–15.4)
WBC: 5.2 10*3/uL (ref 3.4–10.8)

## 2018-01-19 LAB — BASIC METABOLIC PANEL
BUN / CREAT RATIO: 22 (ref 12–28)
BUN: 39 mg/dL — ABNORMAL HIGH (ref 8–27)
CO2: 21 mmol/L (ref 20–29)
Calcium: 9.7 mg/dL (ref 8.7–10.3)
Chloride: 99 mmol/L (ref 96–106)
Creatinine, Ser: 1.75 mg/dL — ABNORMAL HIGH (ref 0.57–1.00)
GFR calc Af Amer: 36 mL/min/{1.73_m2} — ABNORMAL LOW (ref >59)
GFR, EST NON AFRICAN AMERICAN: 31 mL/min/{1.73_m2} — AB (ref >59)
Glucose: 98 mg/dL (ref 65–99)
Potassium: 3.3 mmol/L — ABNORMAL LOW (ref 3.5–5.2)
SODIUM: 143 mmol/L (ref 134–144)

## 2018-01-19 LAB — DIGOXIN LEVEL: Digoxin, Serum: 0.5 ng/mL (ref 0.5–0.9)

## 2018-01-19 MED ORDER — POTASSIUM CHLORIDE CRYS ER 20 MEQ PO TBCR: 40.0000 meq | EXTENDED_RELEASE_TABLET | Freq: Every day | ORAL | 3 refills | Status: DC

## 2018-01-19 NOTE — Progress Notes (Signed)
Pt advised that her K was low 3.3 and per Dr. Caryl Comes to increase his K to 19meq a day and to repeat his BMET in 2 weeks.. Dec 2 before her procedure on Feb 03, 2018.

## 2018-01-20 ENCOUNTER — Ambulatory Visit (INDEPENDENT_AMBULATORY_CARE_PROVIDER_SITE_OTHER): Payer: Medicare Other | Admitting: Podiatry

## 2018-01-20 ENCOUNTER — Encounter: Payer: Self-pay | Admitting: Podiatry

## 2018-01-20 VITALS — BP 100/64 | HR 68

## 2018-01-20 DIAGNOSIS — B351 Tinea unguium: Secondary | ICD-10-CM

## 2018-01-20 DIAGNOSIS — I255 Ischemic cardiomyopathy: Secondary | ICD-10-CM

## 2018-01-20 DIAGNOSIS — L600 Ingrowing nail: Secondary | ICD-10-CM

## 2018-01-21 NOTE — Progress Notes (Signed)
Subjective:   Patient ID: [name], female   DOB: 62 y.o.   MRN: [id number]   HPI Patient concerned about raised nails on the big toes bilateral stating they do not hurt but they do get thick and she wanted to see if there is anything we can do   Review of Systems  All other systems reviewed and are negative.       Objective:  Physical Exam  Constitutional: She appears well-developed and well-nourished.  Cardiovascular: Intact distal pulses.  Pulmonary/Chest: Effort normal.  Musculoskeletal: Normal range of motion.  Neurological: She is alert.  Skin: Skin is warm.  Nursing note and vitals reviewed.   Neurovascular status intact muscle strength is adequate range of motion within normal limits with patient found to have thickened yellow brittle nailbeds hallux bilateral with what appears to be trauma with slight discoloration associated which is probably related to the trauma experience.  Patient has good digital perfusion is well oriented x3     Assessment:  Probability that she is had trauma to the nails with thickness of the beds and subungual debris formation     Plan:  Reviewed condition and did H&P and I do not recommend aggressive treatment for this as it appears to be more related to trauma than it does appear to be an active fungal infection I do not think that will be appropriate currently.  I debrided the nails will allow them to grow out see how they recover and decide if anything else would be appropriate for

## 2018-01-24 ENCOUNTER — Other Ambulatory Visit (HOSPITAL_COMMUNITY): Payer: Self-pay | Admitting: Internal Medicine

## 2018-02-01 ENCOUNTER — Other Ambulatory Visit: Payer: Medicare Other | Admitting: *Deleted

## 2018-02-01 DIAGNOSIS — Z01812 Encounter for preprocedural laboratory examination: Secondary | ICD-10-CM | POA: Diagnosis not present

## 2018-02-01 DIAGNOSIS — I2510 Atherosclerotic heart disease of native coronary artery without angina pectoris: Secondary | ICD-10-CM | POA: Diagnosis not present

## 2018-02-01 DIAGNOSIS — I255 Ischemic cardiomyopathy: Secondary | ICD-10-CM | POA: Diagnosis not present

## 2018-02-01 LAB — BASIC METABOLIC PANEL
BUN/Creatinine Ratio: 22 (ref 12–28)
BUN: 40 mg/dL — ABNORMAL HIGH (ref 8–27)
CALCIUM: 9.8 mg/dL (ref 8.7–10.3)
CHLORIDE: 97 mmol/L (ref 96–106)
CO2: 24 mmol/L (ref 20–29)
Creatinine, Ser: 1.82 mg/dL — ABNORMAL HIGH (ref 0.57–1.00)
GFR calc Af Amer: 34 mL/min/{1.73_m2} — ABNORMAL LOW (ref >59)
GFR calc non Af Amer: 30 mL/min/{1.73_m2} — ABNORMAL LOW (ref >59)
GLUCOSE: 82 mg/dL (ref 65–99)
POTASSIUM: 4.1 mmol/L (ref 3.5–5.2)
Sodium: 138 mmol/L (ref 134–144)

## 2018-02-03 ENCOUNTER — Encounter (HOSPITAL_COMMUNITY)
Admission: RE | Disposition: A | Discharge status: Home / Self Care | Payer: Self-pay | Source: Ambulatory Visit | Attending: Internal Medicine

## 2018-02-03 ENCOUNTER — Ambulatory Visit (HOSPITAL_COMMUNITY)
Admission: RE | Admit: 2018-02-03 | Discharge: 2018-02-03 | Disposition: A | Discharge status: Home / Self Care | Payer: Medicare Other | Source: Ambulatory Visit | Attending: Internal Medicine | Admitting: Internal Medicine

## 2018-02-03 ENCOUNTER — Other Ambulatory Visit: Payer: Self-pay

## 2018-02-03 DIAGNOSIS — I255 Ischemic cardiomyopathy: Secondary | ICD-10-CM | POA: Diagnosis not present

## 2018-02-03 DIAGNOSIS — I2510 Atherosclerotic heart disease of native coronary artery without angina pectoris: Secondary | ICD-10-CM | POA: Diagnosis not present

## 2018-02-03 DIAGNOSIS — I5022 Chronic systolic (congestive) heart failure: Secondary | ICD-10-CM | POA: Diagnosis not present

## 2018-02-03 DIAGNOSIS — I252 Old myocardial infarction: Secondary | ICD-10-CM | POA: Insufficient documentation

## 2018-02-03 DIAGNOSIS — E1151 Type 2 diabetes mellitus with diabetic peripheral angiopathy without gangrene: Secondary | ICD-10-CM | POA: Insufficient documentation

## 2018-02-03 DIAGNOSIS — E0590 Thyrotoxicosis, unspecified without thyrotoxic crisis or storm: Secondary | ICD-10-CM | POA: Diagnosis not present

## 2018-02-03 DIAGNOSIS — N183 Chronic kidney disease, stage 3 (moderate): Secondary | ICD-10-CM | POA: Diagnosis not present

## 2018-02-03 DIAGNOSIS — Y713 Surgical instruments, materials and cardiovascular devices (including sutures) associated with adverse incidents: Secondary | ICD-10-CM | POA: Insufficient documentation

## 2018-02-03 DIAGNOSIS — I428 Other cardiomyopathies: Secondary | ICD-10-CM

## 2018-02-03 DIAGNOSIS — Z7982 Long term (current) use of aspirin: Secondary | ICD-10-CM | POA: Insufficient documentation

## 2018-02-03 DIAGNOSIS — Z951 Presence of aortocoronary bypass graft: Secondary | ICD-10-CM | POA: Insufficient documentation

## 2018-02-03 DIAGNOSIS — Z86718 Personal history of other venous thrombosis and embolism: Secondary | ICD-10-CM | POA: Insufficient documentation

## 2018-02-03 DIAGNOSIS — T82110A Breakdown (mechanical) of cardiac electrode, initial encounter: Secondary | ICD-10-CM | POA: Insufficient documentation

## 2018-02-03 DIAGNOSIS — I472 Ventricular tachycardia: Secondary | ICD-10-CM | POA: Insufficient documentation

## 2018-02-03 DIAGNOSIS — Z794 Long term (current) use of insulin: Secondary | ICD-10-CM | POA: Diagnosis not present

## 2018-02-03 DIAGNOSIS — I447 Left bundle-branch block, unspecified: Secondary | ICD-10-CM | POA: Insufficient documentation

## 2018-02-03 DIAGNOSIS — I4819 Other persistent atrial fibrillation: Secondary | ICD-10-CM | POA: Diagnosis not present

## 2018-02-03 DIAGNOSIS — E1122 Type 2 diabetes mellitus with diabetic chronic kidney disease: Secondary | ICD-10-CM | POA: Diagnosis not present

## 2018-02-03 DIAGNOSIS — I130 Hypertensive heart and chronic kidney disease with heart failure and stage 1 through stage 4 chronic kidney disease, or unspecified chronic kidney disease: Secondary | ICD-10-CM | POA: Insufficient documentation

## 2018-02-03 HISTORY — PX: LEAD REVISION/REPAIR: EP1213

## 2018-02-03 LAB — SURGICAL PCR SCREEN
MRSA, PCR: NEGATIVE
Staphylococcus aureus: NEGATIVE

## 2018-02-03 LAB — GLUCOSE, CAPILLARY: GLUCOSE-CAPILLARY: 102 mg/dL — AB (ref 70–99)

## 2018-02-03 SURGERY — LEAD REVISION/REPAIR

## 2018-02-03 MED ORDER — CEFAZOLIN SODIUM-DEXTROSE 2-4 GM/100ML-% IV SOLN
2.0000 g | INTRAVENOUS | Status: AC
  Administered 2018-02-03: 2 g via INTRAVENOUS

## 2018-02-03 MED ORDER — ACETAMINOPHEN 325 MG PO TABS
325.0000 mg | ORAL_TABLET | ORAL | Status: DC | PRN
  Filled 2018-02-03: qty 2

## 2018-02-03 MED ORDER — MIDAZOLAM HCL 5 MG/5ML IJ SOLN
INTRAMUSCULAR | Status: DC | PRN
  Administered 2018-02-03: 2 mg via INTRAVENOUS
  Administered 2018-02-03: 1 mg via INTRAVENOUS

## 2018-02-03 MED ORDER — SODIUM CHLORIDE 0.9 % IV SOLN: INTRAVENOUS | Status: AC

## 2018-02-03 MED ORDER — LIDOCAINE HCL (PF) 1 % IJ SOLN
INTRAMUSCULAR | Status: DC | PRN
  Administered 2018-02-03: 60 mL

## 2018-02-03 MED ORDER — ONDANSETRON HCL 4 MG/2ML IJ SOLN: 4.0000 mg | Freq: Four times a day (QID) | INTRAMUSCULAR | Status: DC | PRN

## 2018-02-03 MED ORDER — LIDOCAINE HCL 1 % IJ SOLN
INTRAMUSCULAR | Status: AC
  Filled 2018-02-03: qty 60

## 2018-02-03 MED ORDER — SODIUM CHLORIDE 0.9 % IV SOLN
INTRAVENOUS | Status: DC
  Administered 2018-02-03: 13:00:00 via INTRAVENOUS

## 2018-02-03 MED ORDER — CHLORHEXIDINE GLUCONATE 4 % EX LIQD: 60.0000 mL | Freq: Once | CUTANEOUS | Status: DC

## 2018-02-03 MED ORDER — SODIUM CHLORIDE 0.9 % IV SOLN
80.0000 mg | INTRAVENOUS | Status: AC
  Administered 2018-02-03: 80 mg

## 2018-02-03 MED ORDER — FENTANYL CITRATE (PF) 100 MCG/2ML IJ SOLN
INTRAMUSCULAR | Status: DC | PRN
  Administered 2018-02-03: 25 ug via INTRAVENOUS
  Administered 2018-02-03: 50 ug via INTRAVENOUS

## 2018-02-03 MED ORDER — MUPIROCIN 2 % EX OINT
1.0000 "application " | TOPICAL_OINTMENT | Freq: Once | CUTANEOUS | Status: AC
  Administered 2018-02-03: 1 via TOPICAL

## 2018-02-03 MED ORDER — MUPIROCIN 2 % EX OINT
TOPICAL_OINTMENT | CUTANEOUS | Status: AC
  Administered 2018-02-03: 1 via TOPICAL
  Filled 2018-02-03: qty 22

## 2018-02-03 MED ORDER — CEFAZOLIN SODIUM-DEXTROSE 2-4 GM/100ML-% IV SOLN
INTRAVENOUS | Status: AC
  Filled 2018-02-03: qty 100

## 2018-02-03 MED ORDER — SODIUM CHLORIDE 0.9 % IV SOLN
INTRAVENOUS | Status: AC
  Filled 2018-02-03: qty 2

## 2018-02-03 MED ORDER — FENTANYL CITRATE (PF) 100 MCG/2ML IJ SOLN
INTRAMUSCULAR | Status: AC
  Filled 2018-02-03: qty 2

## 2018-02-03 MED ORDER — MIDAZOLAM HCL 5 MG/5ML IJ SOLN
INTRAMUSCULAR | Status: AC
  Filled 2018-02-03: qty 5

## 2018-02-03 SURGICAL SUPPLY — 5 items
CABLE SURGICAL S-101-97-12 (CABLE) ×3 IMPLANT
HEMOSTAT SURGICEL 2X4 FIBR (HEMOSTASIS) ×3 IMPLANT
PAD PRO RADIOLUCENT 2001M-C (PAD) ×3 IMPLANT
POUCH AIGIS-R ANTIBACT ICD (Mesh General) ×3 IMPLANT
TRAY PACEMAKER INSERTION (PACKS) ×3 IMPLANT

## 2018-02-03 NOTE — Discharge Instructions (Signed)
Steri strips will come off in next 10-14 days; if not will remove at wound check  Keep wound dry until tomorrow evening No Driving x 4 days Wound check in office as scheduled    Pacemaker Battery Change, Care After This sheet gives you information about how to care for yourself after your procedure. Your health care provider may also give you more specific instructions. If you have problems or questions, contact your health care provider. What can I expect after the procedure? After your procedure, it is common to have:  Pain or soreness at the site where the pacemaker was inserted.  Swelling at the site where the pacemaker was inserted.  Follow these instructions at home: Incision care  Keep the incision clean and dry. ? Do not take baths, swim, or use a hot tub until your health care provider approves. ? You may shower the day after your procedure, or as directed by your health care provider. ? Pat the area dry with a clean towel. Do not rub the area. This may cause bleeding.  Follow instructions from your health care provider about how to take care of your incision. Make sure you: ? Wash your hands with soap and water before you change your bandage (dressing). If soap and water are not available, use hand sanitizer. ? Change your dressing as told by your health care provider. ? Leave stitches (sutures), skin glue, or adhesive strips in place. These skin closures may need to stay in place for 2 weeks or longer. If adhesive strip edges start to loosen and curl up, you may trim the loose edges. Do not remove adhesive strips completely unless your health care provider tells you to do that.  Check your incision area every day for signs of infection. Check for: ? More redness, swelling, or pain. ? More fluid or blood. ? Warmth. ? Pus or a bad smell. Activity  Do not lift anything that is heavier than 10 lb (4.5 kg) until your health care provider says it is okay to do so.  For the  first 2 weeks, or as long as told by your health care provider: ? Avoid lifting your left arm higher than your shoulder. ? Be gentle when you move your arms over your head. It is okay to raise your arm to comb your hair. ? Avoid strenuous exercise.  Ask your health care provider when it is okay to: ? Resume your normal activities. ? Return to work or school. ? Resume sexual activity. Eating and drinking  Eat a heart-healthy diet. This should include plenty of fresh fruits and vegetables, whole grains, low-fat dairy products, and lean protein like chicken and fish.  Limit alcohol intake to no more than 1 drink a day for non-pregnant women and 2 drinks a day for men. One drink equals 12 oz of beer, 5 oz of wine, or 1 oz of hard liquor.  Check ingredients and nutrition facts on packaged foods and beverages. Avoid the following types of food: ? Food that is high in salt (sodium). ? Food that is high in saturated fat, like full-fat dairy or red meat. ? Food that is high in trans fat, like fried food. ? Food and drinks that are high in sugar. Lifestyle  Do not use any products that contain nicotine or tobacco, such as cigarettes and e-cigarettes. If you need help quitting, ask your health care provider.  Take steps to manage and control your weight.  Get regular exercise. Aim for 150  minutes of moderate-intensity exercise (such as walking or yoga) or 75 minutes of vigorous exercise (such as running or swimming) each week.  Manage other health problems, such as diabetes or high blood pressure. Ask your health care provider how you can manage these conditions. General instructions  Do not drive for 24 hours after your procedure if you were given a medicine to help you relax (sedative).  Take over-the-counter and prescription medicines only as told by your health care provider.  Avoid putting pressure on the area where the pacemaker was placed.  If you need an MRI after your pacemaker  has been placed, be sure to tell the health care provider who orders the MRI that you have a pacemaker.  Avoid close and prolonged exposure to electrical devices that have strong magnetic fields. These include: ? Cell phones. Avoid keeping them in a pocket near the pacemaker, and try using the ear opposite the pacemaker. ? MP3 players. ? Household appliances, like microwaves. ? Metal detectors. ? Electric generators. ? High-tension wires.  Keep all follow-up visits as directed by your health care provider. This is important. Contact a health care provider if:  You have pain at the incision site that is not relieved by over-the-counter or prescription medicines.  You have any of these around your incision site or coming from it: ? More redness, swelling, or pain. ? Fluid or blood. ? Warmth to the touch. ? Pus or a bad smell.  You have a fever.  You feel brief, occasional palpitations, light-headedness, or any symptoms that you think might be related to your heart. Get help right away if:  You experience chest pain that is different from the pain at the pacemaker site.  You develop a red streak that extends above or below the incision site.  You experience shortness of breath.  You have palpitations or an irregular heartbeat.  You have light-headedness that does not go away quickly.  You faint or have dizzy spells.  Your pulse suddenly drops or increases rapidly and does not return to normal.  You begin to gain weight and your legs and ankles swell. Summary  After your procedure, it is common to have pain, soreness, and some swelling where the pacemaker was inserted.  Make sure to keep your incision clean and dry. Follow instructions from your health care provider about how to take care of your incision.  Check your incision every day for signs of infection, such as more pain or swelling, pus or a bad smell, warmth, or leaking fluid and blood.  Avoid strenuous exercise  and lifting your left arm higher than your shoulder for 2 weeks, or as long as told by your health care provider. This information is not intended to replace advice given to you by your health care provider. Make sure you discuss any questions you have with your health care provider. Document Released: 12/08/2012 Document Revised: 01/10/2016 Document Reviewed: 01/10/2016 Elsevier Interactive Patient Education  2017 [employer].

## 2018-02-03 NOTE — Interval H&P Note (Signed)
ICD Criteria  Current LVEF:25-30%. Within 12 months prior to implant: No   Heart failure history: Yes, Class III  Cardiomyopathy history: Yes, Ischemic Cardiomyopathy - Prior MI.  Atrial Fibrillation/Atrial Flutter: Yes persistent  Ventricular tachycardia history: No  Cardiac arrest history: No.  History of syndromes with risk of sudden death: No.  Previous ICD: Yes, Reason for ICD:  Primary prevention.  Current ICD indication: Primary  PPM indication: No.  Class I or II Bradycardia indication present: No  Beta Blocker therapy for 3 or more months: No, medical reason.  Ace Inhibitor/ARB therapy for 3 or more months: Yes, prescribed.    I have seen [name] is a 62 y.o. femalepre-procedural and has been referred by Dr DB for consideration of ICD lead revision for primary prevention of sudden death.  The patient's chart has been reviewed and they meet criteria for ICD implant.  I have had a thorough discussion with the patient reviewing options.  The patient and their family (if available) have had opportunities to ask questions and have them answered. The patient and I have decided together through the [place] [employer] to revise ICD at this time.  Risks, benefits, alternatives to ICD implantation were discussed in detail with the patient today. The patient  understands that the risks include but are not limited to bleeding, infection, pneumothorax, perforation, tamponade, vascular damage, renal failure, MI, stroke, death, inappropriate shocks, and lead dislodgement and  wishes to proceed.  History and Physical Interval Note:  02/03/2018 1:15 PM  [name]  has presented today for surgery, with the diagnosis of shortness of breathe  The various methods of treatment have been discussed with the patient and family. After consideration of risks, benefits and other options for treatment, the patient has consented to  Procedure(s): LEAD  REVISION/REPAIR (N/A) as a surgical intervention .  The patient's history has been reviewed, patient examined, no change in status, stable for surgery.  I have reviewed the patient's chart and labs.  Questions were answered to the patient's satisfaction.     [name]

## 2018-02-04 ENCOUNTER — Encounter (HOSPITAL_COMMUNITY): Payer: Self-pay | Admitting: Internal Medicine

## 2018-02-08 ENCOUNTER — Encounter (HOSPITAL_COMMUNITY): Payer: Self-pay

## 2018-02-08 ENCOUNTER — Telehealth (HOSPITAL_COMMUNITY): Payer: Self-pay | Admitting: Cardiology

## 2018-02-08 ENCOUNTER — Ambulatory Visit (HOSPITAL_COMMUNITY)
Admission: RE | Admit: 2018-02-08 | Discharge: 2018-02-08 | Disposition: A | Discharge status: Home / Self Care | Payer: Medicare Other | Source: Ambulatory Visit | Attending: Cardiology | Admitting: Cardiology

## 2018-02-08 VITALS — BP 134/86 | HR 77 | Wt 187.6 lb

## 2018-02-08 DIAGNOSIS — Z6832 Body mass index (BMI) 32.0-32.9, adult: Secondary | ICD-10-CM | POA: Diagnosis not present

## 2018-02-08 DIAGNOSIS — Z95810 Presence of automatic (implantable) cardiac defibrillator: Secondary | ICD-10-CM | POA: Diagnosis not present

## 2018-02-08 DIAGNOSIS — E1151 Type 2 diabetes mellitus with diabetic peripheral angiopathy without gangrene: Secondary | ICD-10-CM | POA: Diagnosis not present

## 2018-02-08 DIAGNOSIS — I130 Hypertensive heart and chronic kidney disease with heart failure and stage 1 through stage 4 chronic kidney disease, or unspecified chronic kidney disease: Secondary | ICD-10-CM | POA: Insufficient documentation

## 2018-02-08 DIAGNOSIS — E1122 Type 2 diabetes mellitus with diabetic chronic kidney disease: Secondary | ICD-10-CM | POA: Diagnosis not present

## 2018-02-08 DIAGNOSIS — N183 Chronic kidney disease, stage 3 (moderate): Secondary | ICD-10-CM | POA: Diagnosis not present

## 2018-02-08 DIAGNOSIS — I252 Old myocardial infarction: Secondary | ICD-10-CM | POA: Insufficient documentation

## 2018-02-08 DIAGNOSIS — I2510 Atherosclerotic heart disease of native coronary artery without angina pectoris: Secondary | ICD-10-CM | POA: Diagnosis not present

## 2018-02-08 DIAGNOSIS — Z8249 Family history of ischemic heart disease and other diseases of the circulatory system: Secondary | ICD-10-CM | POA: Diagnosis not present

## 2018-02-08 DIAGNOSIS — I5022 Chronic systolic (congestive) heart failure: Secondary | ICD-10-CM | POA: Diagnosis not present

## 2018-02-08 DIAGNOSIS — Z794 Long term (current) use of insulin: Secondary | ICD-10-CM | POA: Diagnosis not present

## 2018-02-08 DIAGNOSIS — Z86718 Personal history of other venous thrombosis and embolism: Secondary | ICD-10-CM | POA: Insufficient documentation

## 2018-02-08 DIAGNOSIS — E039 Hypothyroidism, unspecified: Secondary | ICD-10-CM | POA: Insufficient documentation

## 2018-02-08 DIAGNOSIS — Z79899 Other long term (current) drug therapy: Secondary | ICD-10-CM | POA: Diagnosis not present

## 2018-02-08 DIAGNOSIS — I255 Ischemic cardiomyopathy: Secondary | ICD-10-CM | POA: Diagnosis not present

## 2018-02-08 DIAGNOSIS — Z951 Presence of aortocoronary bypass graft: Secondary | ICD-10-CM | POA: Insufficient documentation

## 2018-02-08 DIAGNOSIS — I447 Left bundle-branch block, unspecified: Secondary | ICD-10-CM | POA: Diagnosis not present

## 2018-02-08 DIAGNOSIS — E669 Obesity, unspecified: Secondary | ICD-10-CM | POA: Insufficient documentation

## 2018-02-08 DIAGNOSIS — I428 Other cardiomyopathies: Secondary | ICD-10-CM

## 2018-02-08 DIAGNOSIS — Z7982 Long term (current) use of aspirin: Secondary | ICD-10-CM | POA: Diagnosis not present

## 2018-02-08 DIAGNOSIS — I340 Nonrheumatic mitral (valve) insufficiency: Secondary | ICD-10-CM | POA: Diagnosis not present

## 2018-02-08 LAB — BASIC METABOLIC PANEL
Anion gap: 15 (ref 5–15)
BUN: 52 mg/dL — ABNORMAL HIGH (ref 6–20)
CO2: 27 mmol/L (ref 22–32)
CREATININE: 1.94 mg/dL — AB (ref 0.44–1.00)
Calcium: 9.7 mg/dL (ref 8.9–10.3)
Chloride: 96 mmol/L — ABNORMAL LOW (ref 98–111)
GFR calc Af Amer: 32 mL/min — ABNORMAL LOW (ref >60)
GFR calc non Af Amer: 27 mL/min — ABNORMAL LOW (ref >60)
GLUCOSE: 205 mg/dL — AB (ref 70–99)
Potassium: 3.6 mmol/L (ref 3.5–5.1)
Sodium: 138 mmol/L (ref 135–145)

## 2018-02-08 MED ORDER — POTASSIUM CHLORIDE CRYS ER 20 MEQ PO TBCR: 40.0000 meq | EXTENDED_RELEASE_TABLET | Freq: Every day | ORAL | 3 refills | Status: DC

## 2018-02-08 MED ORDER — FUROSEMIDE 80 MG PO TABS: ORAL_TABLET | ORAL | 6 refills | Status: DC

## 2018-02-08 NOTE — Telephone Encounter (Signed)
-----   Message from Shirley Friar, PA-C sent at 02/08/2018  4:17 PM EST ----- Her creatinine has trended up somewhat, but so has her volume.  Can we please recheck BMET next week? She has appointment with EP 12/18 if easiest to do then.

## 2018-02-08 NOTE — Telephone Encounter (Signed)
Notes recorded by Kerry Dory, CMA on 02/08/2018 at 4:20 PM EST Patient aware. Patient voiced understanding   ------  Notes recorded by Shirley Friar, PA-C on 02/08/2018 at 4:17 PM EST Her creatinine has trended up somewhat, but so has her volume. Can we please recheck BMET next week? She has appointment with EP 12/18 if easiest to do then.

## 2018-02-08 NOTE — Patient Instructions (Addendum)
REFILLED Potassium  Labs done today  Take an extra 40mg  (0.5 tab) for the next 2 days  Follow up with the Advance Practice Provider in 4-6 weeks

## 2018-02-08 NOTE — Progress Notes (Signed)
Patient ID: [name], female   DOB: December 12, 1955, 62 y.o.   MRN: [id number]    Advanced Heart Failure Clinic Note  PCP: Dr. Posey Pronto  Primary HF:  Dr. Haroldine Laws  EP: Dr Beaulah Dinning Transplant : Dr Radene Knee   HPI: 62 y.o. female with history of HTN, DM2, past polysubstance abuse (ETOH, tobacco, cocaine), CAD s/p CABG x5 with MV annuloplasty (2007), ischemic cardiomyopathy s/p Medtronic CRT-D, chronic systolic HF EF 40-98%, CKD stage III and PAD. Blood type O+.  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 At time of cath LAD, LCX and RCA occluded. EF 45%. Had PCI of LAD followed by abrupt stent occlusion. Had repeat PCI of LAD and then underwent CABG x 5 with mitral valve annuloplasty in 2007.   Amitted 8/2-10/07/13 for syncope s/p ICD shock. Concern that VT was possibly from ischemia and taken for [facility]. Started on Amiodarone. Cardiac output dropped while in hospital so started on milrinone. Discharged home. Underwent CRT-D upgrade on 12/02/13  In 12/15, she was admitted with catheter infection. She was admitted and catheter was replaced.  Milrinone titrated off in January 2016 and has done reasonably well.   Currently undergoing heart-kidney transplant eval with Dr. Radene Knee at [facility]. Had cath 03/10/17 as part of process.   Underwent Generator change and lead revision 11/13/17. Goal was for HIS bundle pacing, but ended up with septal pacing. Was feeling bad after and saw Dr. Caryl Comes on 12/11/17.    Now s/p lead revision. HIS lead was capped and LV lead was incorporated into system.   She presents today for post lead revision follow up. She is feeling slightly better functionally since then. NYHA status remains III symptoms, but not quite as bad.  She is frustrated that none of this started until her initial lead revision in September. She is optimistic that this "re-revision" will be helpful. Denies lightheadedness or dizziness. No orthopnea or PND. She can do  her ADLs, but remains SOB with anything more. Edema relatively well controlled. Weight has been stable.   ICD interrogation:. Optivol trending up since ICD revision. Thoracic impedence depressed, but improved with metolazone last week. Pt activity down to < 2 hrs daily. She is now CRT pacing, but appears to only be between 50-70%. Personally reviewed.    [facility] 03/10/2017  Ao = 99/59 (75) LV = 104/22 RA = 11 RV = 57/13 PA = 61/28 (39) PCW = 24 (v = 35-40) Fick cardiac output/index = 5.5/2.9 Thermo CO/CI = 6.9/3.7 PVR = 2.2 WU FA sat = 96% PA sat = 65%, 64% Assessment: 1. Severe 3v CAD 2. All 5 grafts open 3. Moderately elevated biventricular pressures with normal cardiac output. Prominent v-waves in PCW tracing c/w with significant MR  Echos: 05/19/2012  EF 15% 2/15 EF 15%, s/p MV repair with moderate-severe MR.  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 Lives in [place] with 2 sons([name] and [name]).  Review of systems complete and found to be negative unless listed in HPI.    Past Medical History:  Diagnosis Date  . AICD (automatic cardioverter/defibrillator) present   . Chronic systolic heart failure ([place])    a. ICM b. ECHO (06/2013): EF 15%, severe MR (06/2013) c. RHC (10/2013): RA 5, RV 23/2/5, PA 25/6 (14), PCWP 12, Fick CO/CI: 3.2/1.7, PVR 0.7 WU, PA 45%, 47% and 55% (with levophed 5), vagal response during cath. d. On home milrinone.  . CKD (chronic kidney disease), stage III ([place])   . Coronary artery disease    a. s/p CABG  x 5 with MV annuloplasty 2007   . Diabetes mellitus   . DVT (deep venous thrombosis) ([place]) 2009  . Hypertension   . Implantable defibrillator   medtronic   . Ischemic cardiomyopathy    a. s/p ICD. b. LHC (10/04/13): 1. Severe native CAD with all bypass grafts patent. c. CRT upgrade 12/2013.  [name] LBBB (left bundle branch block)   . Lipoma   . PAD (peripheral artery disease) ([place])   . PICC line infection    a. 01/2014 - PICC exchanged.  . Polysubstance abuse ([place])    history of  (cocaine, tobacco and ETOH)  . Presence of permanent cardiac pacemaker   . V-tach [facility])     Current Outpatient Medications  Medication Sig Dispense Refill  . ACCU-CHEK FASTCLIX LANCETS MISC Use to test blood glucose 3 times daily. Dx:E11.9 102 each 5  . acetaminophen (TYLENOL) 500 MG tablet Take 1,000 mg by mouth every 6 (six) hours as needed for moderate pain.    [name] allopurinol (ZYLOPRIM) 100 MG tablet Take 2 tablets (200 mg total) by mouth at bedtime.    [name] amiodarone (PACERONE) 200 MG tablet Take 0.5 tablets (100 mg total) by mouth daily. 15 tablet 6  . aspirin EC 81 MG tablet Take 81 mg by mouth every morning.     [name] atorvastatin (LIPITOR) 80 MG tablet TAKE 1 TABLET BY MOUTH ONCE DAILY 90 tablet 3  . Blood Glucose Monitoring Suppl (ACCU-CHEK NANO SMARTVIEW) W/DEVICE KIT Use to test blood glucose 2 times daily. Dx:E11.9 1 kit 0  . Cholecalciferol (VITAMIN D3) 125 MCG (5000 UT) CAPS Take 5,000 Units by mouth daily.    . digoxin (LANOXIN) 0.125 MG tablet TAKE 0.0625 MG TABLET BY MOUTH EVERY OTHER DAY 15 tablet 3  . docusate sodium (COLACE) 100 MG capsule Take 100 mg by mouth daily as needed for mild constipation.    . furosemide (LASIX) 80 MG tablet Take 1 tablet (80 mg total) by mouth daily. (Patient taking differently: Take 80 mg by mouth at bedtime. ) 45 tablet 3  . glucose blood (ACCU-CHEK SMARTVIEW) test strip Use to test blood glucose 4 times daily (before meals and at bedtime). Dx:E11.9. INSULIN DEPENDENT 100  each 2  . Insulin Glargine (LANTUS SOLOSTAR) 100 UNIT/ML Solostar Pen Inject 30 units into the skin at bedtime.Diagnosis code:E11.8 15 pen 3  . Insulin Pen Needle 32G X 4 MM MISC Use to inject insulin 1 time a day 100 each 3  . JANUVIA 25 MG tablet TAKE 1 TABLET BY MOUTH ONCE DAILY (Patient taking differently: Take 25 mg by mouth at bedtime. ) 90 tablet 1  . losartan (COZAAR) 25 MG tablet Take 0.5 tablets (12.5 mg total) by mouth 2 (two) times daily.    . Melatonin 1 MG TABS Take 2 mg by mouth at bedtime as needed (sleep).    . metolazone (ZAROXOLYN)  2.5 MG tablet Take 1 tablet (2.5 mg total) by mouth once a week. May take an additional tablet ONLY as directed by the CHF clinic. (Patient taking differently: Take 2.5 mg by mouth every Friday. May take an additional tablet ONLY as directed by the CHF clinic.) 15 tablet 3  . neomycin-bacitracin-polymyxin (NEOSPORIN) OINT Apply 1 application topically 2 (two) times daily. 1 Tube 0  . nitroGLYCERIN (NITROSTAT) 0.4 MG SL tablet Place 1 tablet (0.4 mg total) under the tongue every 5 (five) minutes as needed. For chest pain. 25 tablet 11  . potassium chloride SA (K-DUR,KLOR-CON) 20 MEQ tablet Take 2 tablets (40 mEq total) by mouth daily. 90 tablet 3   No current facility-administered medications for this encounter.    Vitals:   02/08/18 1405  BP: 134/86  Pulse: 77  SpO2: 96%  Weight: 85.1 kg (187 lb 9.6 oz)     Wt Readings from Last 3 Encounters:  02/08/18 85.1 kg (187 lb 9.6 oz)  02/03/18 83.9 kg (185 lb)  01/18/18 84 kg (185 lb 3.2 oz)     PHYSICAL EXAM: General: Well appearing. No resp difficulty. HEENT: Normal Neck: Supple. JVP 5-6. Carotids 2+ bilat; no bruits. No thyromegaly or nodule noted. Cor: PMI nondisplaced. RRR, No M/G/R noted. ICD site stable. Bandaged. Lungs: CTAB, normal effort. Abdomen: Soft, non-tender, non-distended, no HSM. No bruits or masses. +BS  Extremities: No cyanosis, clubbing, or rash. R and LLE no edema.    Neuro: Alert & orientedx3, cranial nerves grossly intact. moves all 4 extremities w/o difficulty. Affect pleasant   EKG today shows A/sensed V paced at 69 bpm with QRS 170 ms.   ASSESSMENT & PLAN:  1. Chronic systolic CHF: Ischemic cardiomyopathy s/p Medtronic CRT-D, EF 20-25% ( Echo 04/2015). - Had LV lead revision in 9/19 and unable to place lead in CS so received HIS lead placement.  - Underwent lead revision 02/03/18 with cessation of HIS lead pacing and LV lead  - She follows in the transplant clinic at [facility] q 6 months with Dr. Radene Knee. Being worked up for heart-kidney transplant.  - She is blood type O - Echo 9/18 EF 25% RV ok  - Volume status mildly volume overloaded on exam and optivol. Recommended she take an extra 40 mg of lasix x 2 days to re-equilibrate.  - Continue lasix 80 mg daily and metolazone every Friday. Can take extra 40 mg of lasix in the evening as needed.  - Intolerant entresto and carvedilol due to hypotension. Tried multiple times.  - Continue losartan 12.5 mg daily.  - [name] stopped with worsening renal function.  - Continue digoxin 0.0625 mg daily.  - Needs repeat CPX. She wishes to recover fully from lead revision, so will plan to schedule after visit at the beginning of the year.  - She is now CRT pacing, but appears to only be between 50-70%. She sees EP next week for wound check. Will ask EP about CRT optimization.   2. CAD:  - Stable on recent cath 1/19 - No s/s of ischemia.    - Continue ASA/statin  3. Mitral regurgitation:  - S/p mitral valve annuloplasty with CABG in 2007. Moderate to severe MR on echo in 4/15. TEE (06/2013) mitral regurgitation appeared severe, anatomy of repaired valve does not look suitable for MV clipping and would be high risk for MV replacement.  - Mild to moderate MR on recent echo 9/18  4. CKD stage III:  - Renal US - no hydronephrosis,. -  Check BMET today.  - Follows with [facility]  5.  NSVT:  - Continue amiodarone  100 mg.daily  - TSH elelvated on 5/13. Started thyroid medications.  - No VT noted on device interrogation.   6. Obesity - Body mass index is 32.2 kg/m.   7. PAD:  - Symptoms stable.  - Right mid SFA stenosis per [place] 08/30/14.    8. Colon polyps - Had colonoscopy 06/08/14 with 12 polyps.  - Repeat colonoscopy Pending.  9. Hypothyroidism - TSH elevated 07/13/2017. PCP start synthroid. - Following with Endo. Seen last 01/01/18. T3, T4 stable at that visit.   Doing well overall. Hopeful that she will gradually improve after lead revision. Sees EP next week for wound check, will ask about CRT optimization.   Shirley Friar, PA-C  2:26 PM   Greater than 50% of the 25 minute visit was spent in counseling/coordination of care regarding disease state education, salt/fluid restriction, sliding scale diuretics, and medication compliance.

## 2018-02-15 NOTE — Progress Notes (Signed)
Thank you :)

## 2018-02-17 ENCOUNTER — Encounter: Payer: Self-pay | Admitting: Internal Medicine

## 2018-02-17 ENCOUNTER — Ambulatory Visit (INDEPENDENT_AMBULATORY_CARE_PROVIDER_SITE_OTHER): Payer: Medicare Other | Admitting: *Deleted

## 2018-02-17 ENCOUNTER — Other Ambulatory Visit: Payer: Medicare Other

## 2018-02-17 DIAGNOSIS — I255 Ischemic cardiomyopathy: Secondary | ICD-10-CM | POA: Diagnosis not present

## 2018-02-17 DIAGNOSIS — I5022 Chronic systolic (congestive) heart failure: Secondary | ICD-10-CM

## 2018-02-18 LAB — BASIC METABOLIC PANEL
BUN/Creatinine Ratio: 22 (ref 12–28)
BUN: 35 mg/dL — ABNORMAL HIGH (ref 8–27)
CO2: 23 mmol/L (ref 20–29)
Calcium: 9.8 mg/dL (ref 8.7–10.3)
Chloride: 100 mmol/L (ref 96–106)
Creatinine, Ser: 1.59 mg/dL — ABNORMAL HIGH (ref 0.57–1.00)
GFR calc Af Amer: 40 mL/min/{1.73_m2} — ABNORMAL LOW (ref >59)
GFR calc non Af Amer: 35 mL/min/{1.73_m2} — ABNORMAL LOW (ref >59)
Glucose: 145 mg/dL — ABNORMAL HIGH (ref 65–99)
Potassium: 4.3 mmol/L (ref 3.5–5.2)
Sodium: 141 mmol/L (ref 134–144)

## 2018-02-19 ENCOUNTER — Other Ambulatory Visit: Payer: Self-pay | Admitting: Internal Medicine

## 2018-02-23 LAB — CUP PACEART INCLINIC DEVICE CHECK
Battery Remaining Longevity: 54 mo
Battery Voltage: 2.98 V
Brady Statistic AP VP Percent: 13.26 %
Brady Statistic AP VS Percent: 0.15 %
Brady Statistic AS VP Percent: 84.42 %
Brady Statistic AS VS Percent: 2.17 %
Brady Statistic RV Percent Paced: 17.72 %
Date Time Interrogation Session: 20191218201925
HIGH POWER IMPEDANCE MEASURED VALUE: 37 Ohm
HighPow Impedance: 50 Ohm
Implantable Lead Implant Date: 20151002
Implantable Lead Location: 753858
Implantable Lead Location: 753859
Implantable Lead Location: 753860
Implantable Lead Model: 4396
Implantable Lead Model: 5076
Implantable Pulse Generator Implant Date: 20190913
Lead Channel Impedance Value: 437 Ohm
Lead Channel Impedance Value: 437 Ohm
Lead Channel Impedance Value: 494 Ohm
Lead Channel Impedance Value: 494 Ohm
Lead Channel Impedance Value: 513 Ohm
Lead Channel Impedance Value: 836 Ohm
Lead Channel Pacing Threshold Amplitude: 1 V
Lead Channel Pacing Threshold Amplitude: 3 V
Lead Channel Pacing Threshold Pulse Width: 0.4 ms
Lead Channel Pacing Threshold Pulse Width: 0.4 ms
Lead Channel Pacing Threshold Pulse Width: 1.5 ms
Lead Channel Sensing Intrinsic Amplitude: 0.75 mV
Lead Channel Sensing Intrinsic Amplitude: 0.75 mV
Lead Channel Sensing Intrinsic Amplitude: 18.75 mV
Lead Channel Sensing Intrinsic Amplitude: 22.25 mV
Lead Channel Setting Pacing Amplitude: 2 V
Lead Channel Setting Pacing Amplitude: 2.75 V
Lead Channel Setting Pacing Amplitude: 3.25 V
Lead Channel Setting Pacing Pulse Width: 0.4 ms
Lead Channel Setting Pacing Pulse Width: 1.5 ms
Lead Channel Setting Sensing Sensitivity: 0.45 mV
MDC IDC LEAD IMPLANT DT: 20111012
MDC IDC LEAD IMPLANT DT: 20151002
MDC IDC MSMT LEADCHNL RV PACING THRESHOLD AMPLITUDE: 1.125 V
MDC IDC STAT BRADY RA PERCENT PACED: 13.23 %

## 2018-02-23 NOTE — Progress Notes (Signed)
Wound check appointment. Steri-strips removed. Wound without redness or edema. Incision edges approximated, wound well healed. Normal device function. Thresholds, sensing, and impedances consistent with implant measurements. Device programmed at appropriate safety margins. Histogram distribution appropriate for patient and level of activity. No mode switches or ventricular arrhythmias noted.   Patient continues to c/o ShOB since gen change on 11/13/17. After review of previous programmed parameters, current device was reprogrammed to Nonadaptive CRT, VV simultaneous, PAVD/SAVD 170/129ms - SK aware. Patient educated about wound care, arm mobility, lifting restrictions, shock plan. ROV w/DC on 03/24/2018 if no improvement w/ sx's, SK in 3 months.

## 2018-03-07 ENCOUNTER — Other Ambulatory Visit (HOSPITAL_COMMUNITY): Payer: Self-pay | Admitting: Internal Medicine

## 2018-03-07 ENCOUNTER — Other Ambulatory Visit (HOSPITAL_COMMUNITY): Payer: Self-pay | Admitting: Adult Health

## 2018-03-07 DIAGNOSIS — I5022 Chronic systolic (congestive) heart failure: Secondary | ICD-10-CM

## 2018-03-08 ENCOUNTER — Encounter: Payer: Self-pay | Admitting: Internal Medicine

## 2018-03-08 ENCOUNTER — Ambulatory Visit (INDEPENDENT_AMBULATORY_CARE_PROVIDER_SITE_OTHER): Payer: Medicare Other | Admitting: Internal Medicine

## 2018-03-08 ENCOUNTER — Other Ambulatory Visit: Payer: Self-pay

## 2018-03-08 DIAGNOSIS — Z7982 Long term (current) use of aspirin: Secondary | ICD-10-CM

## 2018-03-08 DIAGNOSIS — Z87891 Personal history of nicotine dependence: Secondary | ICD-10-CM | POA: Diagnosis not present

## 2018-03-08 DIAGNOSIS — B029 Zoster without complications: Secondary | ICD-10-CM | POA: Diagnosis not present

## 2018-03-08 MED ORDER — VALACYCLOVIR HCL 1 G PO TABS: 1000.0000 mg | ORAL_TABLET | Freq: Two times a day (BID) | ORAL | 0 refills | Status: DC

## 2018-03-08 MED ORDER — DIPHENHYDRAMINE HCL 50 MG PO TABS: 50.0000 mg | ORAL_TABLET | Freq: Three times a day (TID) | ORAL | 0 refills | Status: DC | PRN

## 2018-03-08 NOTE — Progress Notes (Signed)
CC: rash HPI:  [name] is a 63 y.o. female with past medical history as documented below, presented to clinic due to itchy rashes on her back and chest. Please see problem based charting for further details and assessment and plan.   Past Medical History:  Diagnosis Date  . AICD (automatic cardioverter/defibrillator) present   . Chronic systolic heart failure ([place])    a. ICM b. ECHO (06/2013): EF 15%, severe MR (06/2013) c. RHC (10/2013): RA 5, RV 23/2/5, PA 25/6 (14), PCWP 12, Fick CO/CI: 3.2/1.7, PVR 0.7 WU, PA 45%, 47% and 55% (with levophed 5), vagal response during cath. d. On home milrinone.  . CKD (chronic kidney disease), stage III ([place])   . Coronary artery disease    a. s/p CABG x 5 with MV annuloplasty 2007   . Diabetes mellitus   . DVT (deep venous thrombosis) ([place]) 2009  . Hypertension   . Implantable defibrillator   medtronic   . Ischemic cardiomyopathy    a. s/p ICD. b. LHC (10/04/13): 1. Severe native CAD with all bypass grafts patent. c. CRT upgrade 12/2013.  [name] LBBB (left bundle branch block)   . Lipoma   . PAD (peripheral artery disease) ([place])   . PICC line infection    a. 01/2014 - PICC exchanged.  . Polysubstance abuse ([place])    history of  (cocaine, tobacco and ETOH)  . Presence of permanent cardiac pacemaker   . V-tach [facility])     Family Hx: Heart attach    Mother Heart attack    Father  Social Hx: she works as Neurosurgeon  No current smoking No alcohol use No current or recent  drug use  Review of Systems: Review of Systems  Constitutional: Negative for chills and fever.  HENT: Negative.   Respiratory: Negative for cough and shortness of breath.   Cardiovascular: Negative for chest pain, palpitations and leg swelling.  Genitourinary: Negative for dysuria.  Skin: Positive for itching and rash.  Neurological: Negative for headaches.    Physical Exam: Physical Exam Vitals signs reviewed.  Constitutional:      General: She is not in  acute distress.    Appearance: Normal appearance. She is not ill-appearing.  HENT:     Head: Normocephalic and atraumatic.  Eyes:     Extraocular Movements: Extraocular movements intact.  Cardiovascular:     Rate and Rhythm: Normal rate and regular rhythm.     Pulses: Normal pulses.     Heart sounds: Normal heart sounds.  Pulmonary:     Effort: Pulmonary effort is normal.     Breath sounds: Normal breath sounds. No wheezing or rales.  Abdominal:     General: Bowel sounds are normal.     Palpations: Abdomen is soft.     Tenderness: There is no abdominal tenderness.  Skin:    Findings: Lesion and rash present.       Neurological:     Mental Status: She is alert and oriented to person, place, and time.  Psychiatric:        Mood and Affect: Mood normal.        Behavior: Behavior normal.    Vitals:   03/08/18 1348  BP: 108/65  Pulse: 67  Temp: 98.8 F (37.1 C)  TempSrc: Oral  SpO2: 96%  Height: 5\' 4"  (1.626 m)   Physical Exam Vitals signs reviewed.  Constitutional:      General: She is not in acute distress.  Appearance: Normal appearance. She is not ill-appearing.  HENT:     Head: Normocephalic and atraumatic.  Eyes:     Extraocular Movements: Extraocular movements intact.  Cardiovascular:     Rate and Rhythm: Normal rate and regular rhythm.     Pulses: Normal pulses.     Heart sounds: Normal heart sounds.  Pulmonary:     Effort: Pulmonary effort is normal.     Breath sounds: Normal breath sounds. No wheezing or rales.  Abdominal:     General: Bowel sounds are normal.     Palpations: Abdomen is soft.     Tenderness: There is no abdominal tenderness.  Skin:    Findings: Lesion and rash present.       Neurological:     Mental Status: She is alert and oriented to person, place, and time.  Psychiatric:        Mood and Affect: Mood normal.        Behavior: Behavior normal.    Assessment & Plan:   See Encounters Tab for problem based charting.  Patient  seen with Dr. Dareen Piano

## 2018-03-08 NOTE — Patient Instructions (Addendum)
Thank you for allowing [place] to provide your care today.  You were seen in clinic due to itchy rashes with some intermittent pain on your back and chest. As we discussed your rashes seem to be due to Shingles. I prescribe a medication called Valacyclovir. Please take it as prescribed. You can take prescribed Benadryl as needed for itching. If developed sever pain or felt worse, please return to clinic or call us.  Please make sure to cover the lesions and keep them clean. 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 As this can be contagious, please avoid working and close contact until the rashes get clear.  Please follow-up in 2 weeks or earlier as needed.  Should you have any questions or concerns please call the internal medicine clinic at [phone].    Thanks Shingles  Shingles is an infection. It gives you a painful skin rash and blisters that have fluid in them. Shingles is caused by the same germ (virus) that causes chickenpox. Shingles only happens in people who:  Have had chickenpox.  Have been given a shot of medicine (vaccine) to protect against chickenpox. Shingles is rare in this group. The first symptoms of shingles may be itching, tingling, or pain in an area on your skin. A rash will show on your skin a few days or weeks later. The rash is likely to be on one side of your body. The rash usually has a shape like a belt or a band. Over time, the rash turns into fluid-filled blisters. The blisters will break open, change into scabs, and dry up. Medicines may:  Help with pain and itching.  Help you get better sooner.  Help to prevent long-term problems. Follow these instructions at home: Medicines  Take over-the-counter and prescription medicines only as told by your doctor.  Put on an anti-itch cream or numbing cream where you have a rash, blisters, or scabs. Do this as told by your doctor. Helping with itching and discomfort   Put cold, wet cloths (cold  compresses) on the area of the rash or blisters as told by your doctor.  Cool baths can help you feel better. Try adding baking soda or dry oatmeal to the water to lessen itching. Do not bathe in hot water. Blister and rash care  Keep your rash covered with a loose bandage (dressing).  Wear loose clothing that does not rub on your rash.  Keep your rash and blisters clean. To do this, wash the area with mild soap and cool water as told by your doctor.  Check your rash every day for signs of infection. Check for: ? More redness, swelling, or pain. ? Fluid or blood. ? Warmth. ? Pus or a bad smell.  Do not scratch your rash. Do not pick at your blisters. To help you to not scratch: ? Keep your fingernails clean and cut short. ? Wear gloves or mittens when you sleep, if scratching is a problem. General instructions  Rest as told by your doctor.  Keep all follow-up visits as told by your doctor. This is important.  Wash your hands often with soap and water. If soap and water are not available, use hand sanitizer. Doing this lowers your chance of getting a skin infection caused by germs (bacteria).  Your infection can cause chickenpox in people who have never had chickenpox or never got a shot of chickenpox vaccine. If you have blisters that did not change into scabs yet, try not to  touch other people or be around other people, especially: ? Babies. ? Pregnant women. ? Children who have areas of red, itchy, or rough skin (eczema). ? Very old people who have transplants. ? People who have a long-term (chronic) sickness, like cancer or AIDS. Contact a doctor if:  Your pain does not get better with medicine.  Your pain does not get better after the rash heals.  You have any signs of infection in the rash area. These signs include: ? More redness, swelling, or pain around the rash. ? Fluid or blood coming from the rash. ? The rash area feeling warm to the touch. ? Pus or a bad smell  coming from the rash. Get help right away if:  The rash is on your face or nose.  You have pain in your face or pain by your eye.  You lose feeling on one side of your face.  You have trouble seeing.  You have ear pain, or you have ringing in your ear.  You have a loss of taste.  Your condition gets worse. Summary  Shingles gives you a painful skin rash and blisters that have fluid in them.  Shingles is an infection. It is caused by the same germ (virus) that causes chickenpox.  Keep your rash covered with a loose bandage (dressing). Wear loose clothing that does not rub on your rash.  If you have blisters that did not change into scabs yet, try not to touch other people or be around people. This information is not intended to replace advice given to you by your health care provider. Make sure you discuss any questions you have with your health care provider. Document Released: 08/06/2007 Document Revised: 10/22/2016 Document Reviewed: 10/22/2016 Elsevier Interactive Patient Education  2019 [employer].

## 2018-03-09 MED ORDER — FUROSEMIDE 80 MG PO TABS: ORAL_TABLET | ORAL | 3 refills | Status: DC

## 2018-03-09 NOTE — Addendum Note (Signed)
Addended by: Emmagene Ortner, Sharlot Gowda on: 03/09/2018 03:12 PM   Modules accepted: Orders

## 2018-03-10 ENCOUNTER — Ambulatory Visit (INDEPENDENT_AMBULATORY_CARE_PROVIDER_SITE_OTHER): Payer: Medicare Other | Admitting: Internal Medicine

## 2018-03-10 ENCOUNTER — Other Ambulatory Visit: Payer: Self-pay

## 2018-03-10 ENCOUNTER — Ambulatory Visit: Payer: Self-pay | Admitting: Pharmacist

## 2018-03-10 ENCOUNTER — Encounter: Payer: Self-pay | Admitting: Internal Medicine

## 2018-03-10 VITALS — BP 123/63 | HR 72 | Temp 97.8°F | Ht 64.0 in | Wt 190.2 lb

## 2018-03-10 DIAGNOSIS — Z9114 Patient's other noncompliance with medication regimen: Secondary | ICD-10-CM | POA: Diagnosis not present

## 2018-03-10 DIAGNOSIS — N183 Chronic kidney disease, stage 3 (moderate): Secondary | ICD-10-CM

## 2018-03-10 DIAGNOSIS — Z87891 Personal history of nicotine dependence: Secondary | ICD-10-CM

## 2018-03-10 DIAGNOSIS — I255 Ischemic cardiomyopathy: Secondary | ICD-10-CM

## 2018-03-10 DIAGNOSIS — I2510 Atherosclerotic heart disease of native coronary artery without angina pectoris: Secondary | ICD-10-CM | POA: Diagnosis not present

## 2018-03-10 DIAGNOSIS — E1122 Type 2 diabetes mellitus with diabetic chronic kidney disease: Secondary | ICD-10-CM | POA: Diagnosis not present

## 2018-03-10 DIAGNOSIS — E1121 Type 2 diabetes mellitus with diabetic nephropathy: Secondary | ICD-10-CM | POA: Diagnosis not present

## 2018-03-10 DIAGNOSIS — Z794 Long term (current) use of insulin: Secondary | ICD-10-CM | POA: Diagnosis not present

## 2018-03-10 DIAGNOSIS — Z79899 Other long term (current) drug therapy: Secondary | ICD-10-CM | POA: Diagnosis not present

## 2018-03-10 DIAGNOSIS — Z7982 Long term (current) use of aspirin: Secondary | ICD-10-CM

## 2018-03-10 DIAGNOSIS — I5022 Chronic systolic (congestive) heart failure: Secondary | ICD-10-CM

## 2018-03-10 DIAGNOSIS — B029 Zoster without complications: Secondary | ICD-10-CM

## 2018-03-10 DIAGNOSIS — I130 Hypertensive heart and chronic kidney disease with heart failure and stage 1 through stage 4 chronic kidney disease, or unspecified chronic kidney disease: Secondary | ICD-10-CM | POA: Diagnosis not present

## 2018-03-10 DIAGNOSIS — I252 Old myocardial infarction: Secondary | ICD-10-CM | POA: Diagnosis not present

## 2018-03-10 DIAGNOSIS — Z95810 Presence of automatic (implantable) cardiac defibrillator: Secondary | ICD-10-CM

## 2018-03-10 LAB — POCT GLYCOSYLATED HEMOGLOBIN (HGB A1C): HEMOGLOBIN A1C: 6.6 % — AB (ref 4.0–5.6)

## 2018-03-10 LAB — GLUCOSE, CAPILLARY: GLUCOSE-CAPILLARY: 126 mg/dL — AB (ref 70–99)

## 2018-03-10 MED ORDER — GABAPENTIN 100 MG PO CAPS: 100.0000 mg | ORAL_CAPSULE | Freq: Three times a day (TID) | ORAL | 0 refills | Status: DC

## 2018-03-10 MED ORDER — DIGOXIN 125 MCG PO TABS: ORAL_TABLET | ORAL | 3 refills | Status: DC

## 2018-03-10 MED ORDER — POTASSIUM CHLORIDE CRYS ER 20 MEQ PO TBCR: 20.0000 meq | EXTENDED_RELEASE_TABLET | Freq: Every day | ORAL | 11 refills | Status: DC

## 2018-03-10 MED ORDER — CANAGLIFLOZIN 100 MG PO TABS: 100.0000 mg | ORAL_TABLET | Freq: Every day | ORAL | 2 refills | Status: DC

## 2018-03-10 MED ORDER — FUROSEMIDE 80 MG PO TABS: ORAL_TABLET | ORAL | 3 refills | Status: DC

## 2018-03-10 NOTE — Progress Notes (Signed)
Internal Medicine Clinic Attending  I saw and evaluated the patient.  I personally confirmed the key portions of the history and exam documented by Dr. Masoudi and I reviewed pertinent patient test results.  The assessment, diagnosis, and plan were formulated together and I agree with the documentation in the resident's note. 

## 2018-03-10 NOTE — Patient Instructions (Addendum)
I have refilled your potassium, digoxin, furosemide today.     I have also sent a prescription for gabapentin. Please take this once a day.  Please take this with your tylenol to try and help with the pain.   I will start you on a new diabetes medication called Invokana. Please stop taking your insulin.  We will recheck your blood sugar in 1 month to see how you are doing.  Please check your blood sugars 2-3 times per day and bring your meter at your follow-up clinic visit.

## 2018-03-10 NOTE — Assessment & Plan Note (Signed)
[name] presented with 2 days Hx of rashes around her left breast and upper back. She reports the itching started a week ago, and she then noticed some rashes 2 days ago. Since yesterday It associated with pain when wears cloths. She denies any fever or chills. No previous Hx of rashes.  No sick contact.   On exam has no fever. Has grouped erythematous papules with small vesicles, at left (anterior and posterior) T5-T6 dermatomas.  Seems to be Shingles.    Lesions do not cross the mid line and are not disseminated. No indication for inpatient admission and IV treatment. Will treat with PO Valacyclovir (renal dose adjustmnet) and Benadryl and follow in clinic. No major pain at this time.  -Valacyclovir 1 g twice daily x 7 days -Benadryl 50 mg every 8 hours as needed -Follow-up in clinic in 2 weeks or earlier if severe pain -Provided shingles precautions for . 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

## 2018-03-14 NOTE — Assessment & Plan Note (Addendum)
Assessment: Currently being treated with valacyclovir and benadryl treatment. Her rash has improved but she continues to have pain at the area affected.  Plan: 1. Start gabapentin 100 mg TID 2. Continue PO valacyclovir 1 g BID (total 7 day course)

## 2018-03-14 NOTE — Assessment & Plan Note (Addendum)
Assessment: [name] blood sugar remains well-controlled (A1c 6.6 today) despite inconsistent use of insulin. Will plan on discontinuing insulin today to avoid hypoglycemia. Will add an SGLT2 inhibitor as this has improved outcomes in patients with heart failure. I recommended that she undergo continuous glucose monitoring as we are changing her blood sugars. She refused today, stating that she would like to check her blood sugar at home with her monitor.I instructed her to check at least 2-3 times per day and return in 1 month with her meter.  Plan: 1. Continue Januvia 25 mg QD  2. Discontinue Lantus 3. Start canagliflozin 100 mg QD 4. Check blood sugar at home 2-3 times per day and bring meter at next clinic appointment in 1 month.

## 2018-03-14 NOTE — Progress Notes (Signed)
CC: follow-up of shingles symptoms.   HPI:  [name] is a 62 y.o. female with HTN, chronic systolic heart failure, CAD, CKD stage 3, and type 2 diabetes who presents for follow-up of shingles symptoms.   Shingles [name] presented 2 days ago with shingles (rash around left breast and upper back). She was started on PO valacyclovir 1 g BID x 7 days and Benadryl. She states since starting the antiretroviral therapy, she has had decreased pruritus. She continues to have significant pain which has been only somewhat helped with OTC tylenol.    Chronic Systolic Heart Failure She has had CHF since 2007 after an acute anterior MI in the setting of cocaine use causing an ischemic cardiomyopathy. She is currently undergoing heart-kidney transplant with Dr. Radene Knee at [facility]. She underwent an ICD lead revision in 01/2018 and is overall doing well. She is taking all of her currently prescribed medications including losartan, digoxin, Lasix, and metolazone.   Type 2 Diabetes: Home meds include Januvia 25 mg QD and Lantus 30 units daily. She states that she has been inconsistent with taking her Lantus. She reports using her insulin 2-3 times per week. She rarely checks her blood sugar at home.    Past Medical History:  Diagnosis Date  . AICD (automatic cardioverter/defibrillator) present   . Chronic systolic heart failure ([place])    a. ICM b. ECHO (06/2013): EF 15%, severe MR (06/2013) c. RHC (10/2013): RA 5, RV 23/2/5, PA 25/6 (14), PCWP 12, Fick CO/CI: 3.2/1.7, PVR 0.7 WU, PA 45%, 47% and 55% (with levophed 5), vagal response during cath. d. On home milrinone.  . CKD (chronic kidney disease), stage III ([place])   . Coronary artery disease    a. s/p CABG x 5 with MV annuloplasty 2007   . Diabetes mellitus   . DVT (deep venous thrombosis) ([place]) 2009  . Hypertension   . Implantable defibrillator   medtronic   . Ischemic cardiomyopathy    a. s/p ICD. b. LHC (10/04/13): 1. Severe native  CAD with all bypass grafts patent. c. CRT upgrade 12/2013.  [name] LBBB (left bundle branch block)   . Lipoma   . PAD (peripheral artery disease) ([place])   . PICC line infection    a. 01/2014 - PICC exchanged.  . Polysubstance abuse ([place])    history of  (cocaine, tobacco and ETOH)  . Presence of permanent cardiac pacemaker   . V-tach [facility])    Review of Systems:  Review of Systems  Constitutional: Negative for chills and fever.  Respiratory: Negative for cough, sputum production and shortness of breath.   Cardiovascular: Negative for chest pain, palpitations and leg swelling.  Gastrointestinal: Negative for abdominal pain, constipation, diarrhea and vomiting.    Physical Exam:  Vitals:   03/10/18 1512  BP: 123/63  Pulse: 72  Temp: 97.8 F (36.6 C)  TempSrc: Oral  SpO2: 98%  Weight: 190 lb 3.2 oz (86.3 kg)  Height: 5\' 4"  (1.626 m)   Physical Exam Vitals signs and nursing note reviewed.  Constitutional:      Appearance: Normal appearance.  Cardiovascular:     Rate and Rhythm: Normal rate and regular rhythm.  Pulmonary:     Effort: Pulmonary effort is normal. No respiratory distress.     Breath sounds: Normal breath sounds.  Skin:    Comments: Dry macular rash extending laterally from under the left breast to the left side of the back in a dermatomal position.  Neurological:  Mental Status: She is alert.  Psychiatric:        Mood and Affect: Mood normal.        Behavior: Behavior normal.     Assessment & Plan:   See Encounters Tab for problem based charting.  Patient seen with Dr. Lynnae January

## 2018-03-14 NOTE — Assessment & Plan Note (Signed)
Assessment:  She is taking all of her currently prescribed medications including losartan, digoxin, Lasix, and metolazone. Her weight is 190 lbs today (baseline 185-190) and appears euvolemic on exam.   Plan: 1. Continue Losartan 12.5 mg BID 2. Refilled digoxin 0.0625 mg QD 3. Refilled Lasix 80 mg QD 4. Refilled Potassium Chloride 20 mEq qd

## 2018-03-15 NOTE — Progress Notes (Signed)
Internal Medicine Clinic Attending  Case discussed with Dr. Alfonse Spruce at the time of the visit.  We reviewed the resident's history and exam and pertinent patient test results.  I agree with the assessment, diagnosis, and plan of care documented in the resident's note.  Needs a Dig level next blood draw.

## 2018-03-22 ENCOUNTER — Encounter (HOSPITAL_COMMUNITY): Payer: Self-pay

## 2018-03-22 ENCOUNTER — Other Ambulatory Visit (HOSPITAL_COMMUNITY): Payer: Self-pay | Admitting: Internal Medicine

## 2018-03-22 ENCOUNTER — Ambulatory Visit (HOSPITAL_COMMUNITY)
Admission: RE | Admit: 2018-03-22 | Discharge: 2018-03-22 | Disposition: A | Discharge status: Home / Self Care | Payer: Medicare Other | Source: Ambulatory Visit | Attending: Internal Medicine | Admitting: Internal Medicine

## 2018-03-22 VITALS — BP 128/74 | HR 59 | Wt 186.4 lb

## 2018-03-22 DIAGNOSIS — E669 Obesity, unspecified: Secondary | ICD-10-CM | POA: Insufficient documentation

## 2018-03-22 DIAGNOSIS — Z794 Long term (current) use of insulin: Secondary | ICD-10-CM | POA: Insufficient documentation

## 2018-03-22 DIAGNOSIS — Z86718 Personal history of other venous thrombosis and embolism: Secondary | ICD-10-CM | POA: Diagnosis not present

## 2018-03-22 DIAGNOSIS — Z7982 Long term (current) use of aspirin: Secondary | ICD-10-CM | POA: Insufficient documentation

## 2018-03-22 DIAGNOSIS — E1151 Type 2 diabetes mellitus with diabetic peripheral angiopathy without gangrene: Secondary | ICD-10-CM | POA: Insufficient documentation

## 2018-03-22 DIAGNOSIS — N183 Chronic kidney disease, stage 3 (moderate): Secondary | ICD-10-CM | POA: Insufficient documentation

## 2018-03-22 DIAGNOSIS — Z79899 Other long term (current) drug therapy: Secondary | ICD-10-CM | POA: Diagnosis not present

## 2018-03-22 DIAGNOSIS — Z833 Family history of diabetes mellitus: Secondary | ICD-10-CM | POA: Diagnosis not present

## 2018-03-22 DIAGNOSIS — E039 Hypothyroidism, unspecified: Secondary | ICD-10-CM | POA: Diagnosis not present

## 2018-03-22 DIAGNOSIS — E1122 Type 2 diabetes mellitus with diabetic chronic kidney disease: Secondary | ICD-10-CM | POA: Diagnosis not present

## 2018-03-22 DIAGNOSIS — I252 Old myocardial infarction: Secondary | ICD-10-CM | POA: Insufficient documentation

## 2018-03-22 DIAGNOSIS — Z8249 Family history of ischemic heart disease and other diseases of the circulatory system: Secondary | ICD-10-CM | POA: Diagnosis not present

## 2018-03-22 DIAGNOSIS — I472 Ventricular tachycardia: Secondary | ICD-10-CM | POA: Diagnosis not present

## 2018-03-22 DIAGNOSIS — I255 Ischemic cardiomyopathy: Secondary | ICD-10-CM | POA: Diagnosis not present

## 2018-03-22 DIAGNOSIS — I5022 Chronic systolic (congestive) heart failure: Secondary | ICD-10-CM | POA: Diagnosis not present

## 2018-03-22 DIAGNOSIS — I340 Nonrheumatic mitral (valve) insufficiency: Secondary | ICD-10-CM | POA: Diagnosis not present

## 2018-03-22 DIAGNOSIS — K635 Polyp of colon: Secondary | ICD-10-CM | POA: Insufficient documentation

## 2018-03-22 DIAGNOSIS — T462X1A Poisoning by other antidysrhythmic drugs, accidental (unintentional), initial encounter: Secondary | ICD-10-CM | POA: Diagnosis not present

## 2018-03-22 DIAGNOSIS — Z6832 Body mass index (BMI) 32.0-32.9, adult: Secondary | ICD-10-CM | POA: Insufficient documentation

## 2018-03-22 DIAGNOSIS — I447 Left bundle-branch block, unspecified: Secondary | ICD-10-CM | POA: Insufficient documentation

## 2018-03-22 DIAGNOSIS — I428 Other cardiomyopathies: Secondary | ICD-10-CM

## 2018-03-22 DIAGNOSIS — Z951 Presence of aortocoronary bypass graft: Secondary | ICD-10-CM | POA: Diagnosis not present

## 2018-03-22 DIAGNOSIS — I2510 Atherosclerotic heart disease of native coronary artery without angina pectoris: Secondary | ICD-10-CM | POA: Diagnosis not present

## 2018-03-22 DIAGNOSIS — I130 Hypertensive heart and chronic kidney disease with heart failure and stage 1 through stage 4 chronic kidney disease, or unspecified chronic kidney disease: Secondary | ICD-10-CM | POA: Insufficient documentation

## 2018-03-22 DIAGNOSIS — E032 Hypothyroidism due to medicaments and other exogenous substances: Secondary | ICD-10-CM

## 2018-03-22 DIAGNOSIS — Z95810 Presence of automatic (implantable) cardiac defibrillator: Secondary | ICD-10-CM

## 2018-03-22 LAB — BASIC METABOLIC PANEL
ANION GAP: 16 — AB (ref 5–15)
BUN: 45 mg/dL — ABNORMAL HIGH (ref 6–20)
CO2: 26 mmol/L (ref 22–32)
Calcium: 9.7 mg/dL (ref 8.9–10.3)
Chloride: 95 mmol/L — ABNORMAL LOW (ref 98–111)
Creatinine, Ser: 2.07 mg/dL — ABNORMAL HIGH (ref 0.44–1.00)
GFR calc Af Amer: 29 mL/min — ABNORMAL LOW (ref >60)
GFR calc non Af Amer: 25 mL/min — ABNORMAL LOW (ref >60)
Glucose, Bld: 194 mg/dL — ABNORMAL HIGH (ref 70–99)
Potassium: 3.1 mmol/L — ABNORMAL LOW (ref 3.5–5.1)
Sodium: 137 mmol/L (ref 135–145)

## 2018-03-22 MED ORDER — POTASSIUM CHLORIDE CRYS ER 20 MEQ PO TBCR: 20.0000 meq | EXTENDED_RELEASE_TABLET | Freq: Two times a day (BID) | ORAL | 11 refills | Status: DC

## 2018-03-22 NOTE — Progress Notes (Signed)
Patient ID: [name], female   DOB: 12/17/1955, 63 y.o.   MRN: [id number]    Advanced Heart Failure Clinic Note  PCP: Dr. Posey Pronto  Primary HF:  Dr. Haroldine Laws  EP: Dr Beaulah Dinning Transplant : Dr Radene Knee   HPI: [name] is a 63 y.o. female with history of HTN, DM2, past polysubstance abuse (ETOH, tobacco, cocaine), CAD s/p CABG x5 with MV annuloplasty (2007), ischemic cardiomyopathy s/p Medtronic CRT-D, chronic systolic HF EF 16-96%, CKD stage III and PAD. Blood type O+.  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 At time of cath LAD, LCX and RCA occluded. EF 45%. Had PCI of LAD followed by abrupt stent occlusion. Had repeat PCI of LAD and then underwent CABG x 5 with mitral valve annuloplasty in 2007.   Amitted 8/2-10/07/13 for syncope s/p ICD shock. Concern that VT was possibly from ischemia and taken for [facility]. Started on Amiodarone. Cardiac output dropped while in hospital so started on milrinone. Discharged home. Underwent CRT-D upgrade on 12/02/13  In 12/15, she was admitted with catheter infection. She was admitted and catheter was replaced.  Milrinone titrated off in January 2016 and has done reasonably well.   Currently undergoing heart-kidney transplant eval with Dr. Radene Knee at [facility]. Had cath 03/10/17 as part of process.   Underwent Generator change and lead revision 11/13/17. Goal was for HIS bundle pacing, but ended up with septal pacing. Was feeling bad after and saw Dr. Caryl Fields on 12/11/17.    Now s/p lead revision. HIS lead was capped and LV lead was incorporated into system.   She presents today for follow up. Since last visit, she is feeling much better with recent changes to her pacing lead. NYHA II symptoms. She is able to moderately exert herself without DOE, which has been months since she has been able to do. She denies lightheadedness or dizziness. She denies orthopnea or edema. She has been out of her potassium for about 2  weeks. Denies having to take extra lasix, still taking metolazone once weekly.   ICD interrogation: Thoracic impedence below threshold but trending up after recent metolazone. Pt activity > 3 hours daily. No VT/VF/AT/AF. Personally reviewed.    [facility] 03/10/2017  Ao = 99/59 (75) LV = 104/22 RA = 11 RV = 57/13 PA = 61/28 (39) PCW = 24 (v = 35-40) Fick cardiac output/index = 5.5/2.9 Thermo CO/CI = 6.9/3.7 PVR = 2.2 WU FA sat = 96% PA sat = 65%, 64% Assessment: 1. Severe 3v CAD 2. All 5 grafts open 3. Moderately elevated biventricular pressures with normal cardiac output. Prominent v-waves in PCW tracing c/w with significant MR  Echos: 05/19/2012  EF 15% 2/15 EF 15%, s/p MV repair with moderate-severe MR.  4/15: EF 15%, severe MR, mod TR 2/17 EF 20-25% 9/18 Echo EF 20-25% RV normal Mod MR   CPX (4/14): peak VO2 14.8 (68.5% predicted) VE/VCO2 slope 43  RER 1.12 CPX (3/15): peak VO2 15.6 (74.5% predicted) VE/VCO2 slope 41, RER 1.2 CPX (6/16): peak VO2:13.3 (68.6% predicted) VE/VCO2 slope:34  RER 1.12 CPX (6/17): peak VO2 13.3 (71.0% predicted) VE/VCO2 slope 41  RER 1.19   FH: [name] deceased: CAD, DM2, HTN        [name] deceased: stroke  SH: Works odds/end jobs for [employer]; disabled. Lives in [place] with 2 sons([name] and [name]).   Review of systems complete and found to be negative unless listed in HPI.  Past Medical History:  Diagnosis Date  . AICD (automatic cardioverter/defibrillator) present   . Chronic systolic heart failure ([place])    a. ICM b. ECHO (06/2013): EF 15%, severe MR (06/2013) c. RHC (10/2013): RA 5, RV 23/2/5, PA 25/6 (14), PCWP 12, Fick CO/CI: 3.2/1.7, PVR 0.7 WU, PA 45%, 47% and 55% (with levophed 5), vagal response during cath. d. On home milrinone.  . CKD (chronic kidney disease), stage III ([place])   . Coronary artery disease    a. s/p CABG x 5 with MV annuloplasty 2007   . Diabetes mellitus   . DVT (deep venous thrombosis) ([place]) 2009  .  Hypertension   . Implantable defibrillator   medtronic   . Ischemic cardiomyopathy    a. s/p ICD. b. LHC (10/04/13): 1. Severe native CAD with all bypass grafts patent. c. CRT upgrade 12/2013.  [name] LBBB (left bundle branch block)   . Lipoma   . PAD (peripheral artery disease) ([place])   . PICC line infection    a. 01/2014 - PICC exchanged.  . Polysubstance abuse ([place])    history of  (cocaine, tobacco and ETOH)  . Presence of permanent cardiac pacemaker   . V-tach [facility])     Current Outpatient Medications  Medication Sig Dispense Refill  . ACCU-CHEK FASTCLIX LANCETS MISC Use to test blood glucose 3 times daily. Dx:E11.9 102 each 5  . acetaminophen (TYLENOL) 500 MG tablet Take 1,000 mg by mouth every 6 (six) hours as needed for moderate pain.    [name] allopurinol (ZYLOPRIM) 100 MG tablet Take 2 tablets (200 mg total) by mouth at bedtime.    [name] amiodarone (PACERONE) 200 MG tablet Take 0.5 tablets (100 mg total) by mouth daily. 15 tablet 6  . aspirin EC 81 MG tablet Take 81 mg by mouth every morning.     [name] atorvastatin (LIPITOR) 80 MG tablet TAKE 1 TABLET BY MOUTH ONCE DAILY 90 tablet 3  . Blood Glucose Monitoring Suppl (ACCU-CHEK NANO SMARTVIEW) W/DEVICE KIT Use to test blood glucose 2 times daily. Dx:E11.9 1 kit 0  . canagliflozin (INVOKANA) 100 MG TABS tablet Take 1 tablet (100 mg total) by mouth daily before breakfast. 30 tablet 2  . Cholecalciferol (VITAMIN D3) 125 MCG (5000 UT) CAPS Take 5,000 Units by mouth daily.    . digoxin (LANOXIN) 0.125 MG tablet TAKE 0.0625 MG TABLET BY MOUTH EVERY OTHER DAY 15 tablet 3  . diphenhydrAMINE (BENADRYL) 50 MG tablet Take 1 tablet (50 mg total) by mouth every 8 (eight) hours as needed for itching. 30 tablet 0  . docusate sodium (COLACE) 100 MG capsule Take 100 mg by mouth daily as needed for mild constipation.    . furosemide (LASIX) 80 MG tablet Take 1 tablet (80 mg total) by mouth daily. May also take 0.5 tablets (40 mg total) at bedtime as needed. 45 tablet 3   . gabapentin (NEURONTIN) 100 MG capsule Take 1 capsule (100 mg total) by mouth 3 (three) times daily. 90 capsule 0  . glucose blood (ACCU-CHEK SMARTVIEW) test strip Use to test blood glucose 4 times daily (before meals and at bedtime). Dx:E11.9. INSULIN DEPENDENT 100 each 2  . Insulin Glargine (LANTUS SOLOSTAR) 100 UNIT/ML Solostar Pen Inject 30 units into the skin at bedtime.Diagnosis code:E11.8 15 pen 3  . Insulin Pen Needle 32G X 4 MM MISC Use to inject insulin 1 time a day 100 each 3  . JANUVIA 25 MG tablet TAKE 1 TABLET BY MOUTH ONCE DAILY (Patient taking  differently: Take 25 mg by mouth at bedtime. ) 90 tablet 1  . losartan (COZAAR) 25 MG tablet Take 0.5 tablets (12.5 mg total) by mouth 2 (two) times daily.    . Melatonin 1 MG TABS Take 2 mg by mouth at bedtime as needed (sleep).    . metolazone (ZAROXOLYN) 2.5 MG tablet Take 1 tablet (2.5 mg total) by mouth once a week. May take an additional tablet ONLY as directed by the CHF clinic. (Patient taking differently: Take 2.5 mg by mouth every Friday. May take an additional tablet ONLY as directed by the CHF clinic.) 15 tablet 3  . neomycin-bacitracin-polymyxin (NEOSPORIN) OINT Apply 1 application topically 2 (two) times daily. 1 Tube 0  . nitroGLYCERIN (NITROSTAT) 0.4 MG SL tablet Place 1 tablet (0.4 mg total) under the tongue every 5 (five) minutes as needed. For chest pain. 25 tablet 11  . valACYclovir (VALTREX) 1000 MG tablet Take 1 tablet (1,000 mg total) by mouth 2 (two) times daily. 14 tablet 0   No current facility-administered medications for this encounter.    Vitals:   03/22/18 1411  BP: 128/74  Pulse: (!) 59  SpO2: 99%  Weight: 84.6 kg (186 lb 6.4 oz)     Wt Readings from Last 3 Encounters:  03/22/18 84.6 kg (186 lb 6.4 oz)  03/10/18 86.3 kg (190 lb 3.2 oz)  02/08/18 85.1 kg (187 lb 9.6 oz)     PHYSICAL EXAM: General: Well appearing. No resp difficulty. HEENT: Normal Neck: Supple. JVP 5-6. Carotids 2+ bilat; no  bruits. No thyromegaly or nodule noted. Cor: PMI nondisplaced. RRR, No M/G/R noted. ICD site stable. Bandaged. Lungs: CTAB, normal effort. Abdomen: Soft, non-tender, non-distended, no HSM. No bruits or masses. +BS  Extremities: No cyanosis, clubbing, or rash. R and LLE no edema.  Neuro: Alert & orientedx3, cranial nerves grossly intact. moves all 4 extremities w/o difficulty. Affect pleasant   EKG today shows A/sensed V paced at 69 bpm with QRS 170 ms.   ASSESSMENT & PLAN:  1. Chronic systolic CHF: Ischemic cardiomyopathy s/p Medtronic CRT-D, EF 20-25% ( Echo 04/2015). - Had LV lead revision in 9/19 and unable to place lead in CS so received HIS lead placement.  - Echo 03/25/17 LVEF 20-25% (outside hospital/[facility]) with moderate MR.  - Underwent lead revision 02/03/18 with cessation of HIS lead pacing and LV lead  - She follows in the transplant clinic at [facility] q 6 months with Dr. Radene Knee. Being worked up for heart-kidney transplant.  - She is blood type O - Echo 9/18 EF 25% RV ok  - Volume status OK on exam.  - Continue lasix 80 mg daily and metolazone every Friday. Can take extra 40 mg of lasix in the evening as needed.  - Intolerant entresto and carvedilol due to hypotension. Tried multiple times.  - Continue losartan 12.5 mg daily.  - [name] stopped with worsening renal function. BMET today.  - Continue digoxin 0.0625 mg daily.  - Needs repeat CPX. Will order.    2. CAD:  - Stable on recent cath 1/19 - No s/s of ischemia.    - Continue ASA/statin  3. Mitral regurgitation:  - S/p mitral valve annuloplasty with CABG in 2007. Moderate to severe MR on echo in 4/15. TEE (06/2013) mitral regurgitation appeared severe, anatomy of repaired valve does not look suitable for MV clipping and would be high risk for MV replacement.  - Moderate by Echo 03/2017.  - No change.   4. CKD stage  III:  - Renal US - no hydronephrosis,. - BMET today.  - Follows with [facility]  5.  NSVT:  - Continue  amiodarone 100 mg.daily  - TSH elelvated on 5/13. Started thyroid medications.  - No VT noted on device interrogation today.   6. Obesity - Body mass index is 32 kg/m.   7. PAD:  - Symptoms stable.   - Right mid SFA stenosis per [place] 08/30/14.    8. Colon polyps - Had colonoscopy 06/08/14 with 12 polyps.  - Per PCP.   9. Hypothyroidism - TSH elevated 07/13/2017. PCP started synthroid. - Following with Endo. Seen last 01/01/18. T3, T4 stable at that visit.   Functional status much improved after recent lead revision and since last visit. Volume status stable. Labs today. Will repeat CPX.   Shirley Friar, PA-C  2:29 PM   Greater than 50% of the 25 minute visit was spent in counseling/coordination of care regarding disease state education, salt/fluid restriction, sliding scale diuretics, and medication compliance.

## 2018-03-22 NOTE — Patient Instructions (Signed)
Labs done today. We will contact you with any abnormal labs.  Your physician has recommended that you have a cardiopulmonary stress test (CPX). CPX testing is a non-invasive measurement of heart and lung function. It replaces a traditional treadmill stress test. This type of test provides a tremendous amount of information that relates not only to your present condition but also for future outcomes. This test combines measurements of you ventilation, respiratory gas exchange in the lungs, electrocardiogram (EKG), blood pressure and physical response before, during, and following an exercise protocol.  RESTART Potassium 72meq (1 tab) twice daily.  Follow up with Dr. Haroldine Laws in 2-3 months

## 2018-03-23 ENCOUNTER — Telehealth (HOSPITAL_COMMUNITY): Payer: Self-pay | Admitting: Cardiology

## 2018-03-23 DIAGNOSIS — I5022 Chronic systolic (congestive) heart failure: Secondary | ICD-10-CM

## 2018-03-23 NOTE — Telephone Encounter (Signed)
-----   Message from Shirley Friar, PA-C sent at 03/22/2018  3:35 PM EST ----- K low but has been off for ~ 2 weeks. Cr slightly high but took metolazone Saturday.   K resumed at todays visit. Please have her take an EXTRA 40 meq of K today, and needs repeat BMET next week.    Legrand Como [address]" [place], PA-C 03/22/2018 3:35 PM

## 2018-03-23 NOTE — Telephone Encounter (Signed)
Notes recorded by Kerry Dory, CMA on 03/23/2018 at 11:41 AM EST Pt aware and voiced understanding, will have labs repeated at Dr Caryl Comes appt 1/29 order placed ------  Notes recorded by Shirley Friar, PA-C on 03/22/2018 at 3:35 PM EST K low but has been off for ~ 2 weeks. Cr slightly high but took metolazone Saturday.   K resumed at todays visit. Please have her take an EXTRA 40 meq of K today, and needs repeat BMET next week.    Legrand Como [address] [place], PA-C 03/22/2018 3:35 PM

## 2018-03-31 ENCOUNTER — Encounter: Payer: Self-pay | Admitting: Internal Medicine

## 2018-03-31 ENCOUNTER — Ambulatory Visit (INDEPENDENT_AMBULATORY_CARE_PROVIDER_SITE_OTHER): Payer: Medicare Other | Admitting: Internal Medicine

## 2018-03-31 ENCOUNTER — Other Ambulatory Visit: Payer: Self-pay

## 2018-03-31 ENCOUNTER — Ambulatory Visit (INDEPENDENT_AMBULATORY_CARE_PROVIDER_SITE_OTHER): Payer: Medicare Other | Admitting: Nurse Practitioner

## 2018-03-31 VITALS — BP 111/72 | HR 70 | Temp 97.3°F | Ht 64.0 in | Wt 193.1 lb

## 2018-03-31 DIAGNOSIS — I130 Hypertensive heart and chronic kidney disease with heart failure and stage 1 through stage 4 chronic kidney disease, or unspecified chronic kidney disease: Secondary | ICD-10-CM

## 2018-03-31 DIAGNOSIS — E118 Type 2 diabetes mellitus with unspecified complications: Secondary | ICD-10-CM

## 2018-03-31 DIAGNOSIS — N183 Chronic kidney disease, stage 3 (moderate): Secondary | ICD-10-CM | POA: Diagnosis not present

## 2018-03-31 DIAGNOSIS — Z7984 Long term (current) use of oral hypoglycemic drugs: Secondary | ICD-10-CM

## 2018-03-31 DIAGNOSIS — B351 Tinea unguium: Secondary | ICD-10-CM | POA: Diagnosis not present

## 2018-03-31 DIAGNOSIS — I5022 Chronic systolic (congestive) heart failure: Secondary | ICD-10-CM

## 2018-03-31 DIAGNOSIS — Z7982 Long term (current) use of aspirin: Secondary | ICD-10-CM | POA: Diagnosis not present

## 2018-03-31 DIAGNOSIS — B029 Zoster without complications: Secondary | ICD-10-CM

## 2018-03-31 DIAGNOSIS — I2510 Atherosclerotic heart disease of native coronary artery without angina pectoris: Secondary | ICD-10-CM | POA: Diagnosis not present

## 2018-03-31 DIAGNOSIS — E1122 Type 2 diabetes mellitus with diabetic chronic kidney disease: Secondary | ICD-10-CM

## 2018-03-31 DIAGNOSIS — Z8619 Personal history of other infectious and parasitic diseases: Secondary | ICD-10-CM | POA: Diagnosis not present

## 2018-03-31 DIAGNOSIS — Z87891 Personal history of nicotine dependence: Secondary | ICD-10-CM | POA: Diagnosis not present

## 2018-03-31 LAB — CUP PACEART INCLINIC DEVICE CHECK
Date Time Interrogation Session: 20200129104216
Implantable Lead Implant Date: 20111012
Implantable Lead Implant Date: 20151002
Implantable Lead Implant Date: 20151002
Implantable Lead Location: 753858
Implantable Lead Location: 753859
Implantable Lead Location: 753860
Implantable Lead Model: 4396
Implantable Lead Model: 5076
Implantable Lead Model: 6947
Implantable Pulse Generator Implant Date: 20190913

## 2018-03-31 LAB — BASIC METABOLIC PANEL WITH GFR
BUN/Creatinine Ratio: 20 (ref 12–28)
BUN: 32 mg/dL — ABNORMAL HIGH (ref 8–27)
CO2: 23 mmol/L (ref 20–29)
Calcium: 9.4 mg/dL (ref 8.7–10.3)
Chloride: 99 mmol/L (ref 96–106)
Creatinine, Ser: 1.62 mg/dL — ABNORMAL HIGH (ref 0.57–1.00)
GFR calc Af Amer: 39 mL/min/1.73 — ABNORMAL LOW
GFR calc non Af Amer: 34 mL/min/1.73 — ABNORMAL LOW
Glucose: 166 mg/dL — ABNORMAL HIGH (ref 65–99)
Potassium: 3.9 mmol/L (ref 3.5–5.2)
Sodium: 139 mmol/L (ref 134–144)

## 2018-03-31 LAB — GLUCOSE, CAPILLARY: Glucose-Capillary: 104 mg/dL — ABNORMAL HIGH (ref 70–99)

## 2018-03-31 MED ORDER — TERBINAFINE HCL 250 MG PO TABS: 250.0000 mg | ORAL_TABLET | Freq: Every day | ORAL | 0 refills | Status: AC

## 2018-03-31 MED ORDER — ZOSTER VAC RECOMB ADJUVANTED 50 MCG/0.5ML IM SUSR: 0.5000 mL | Freq: Once | INTRAMUSCULAR | 0 refills | Status: AC

## 2018-03-31 MED ORDER — ZOSTER VAC RECOMB ADJUVANTED 50 MCG/0.5ML IM SUSR: 0.5000 mL | Freq: Once | INTRAMUSCULAR | 0 refills | Status: DC

## 2018-03-31 NOTE — Assessment & Plan Note (Signed)
Assessment: Completed a course of valacyclovir and is no longer having any symptoms including rash or pain. Will plan on giving Shingrix vaccine for secondary prevention of shingles reoccurrence now that she is out of the active-phase of the infection.   Plan: 1. Provided 2 paper prescriptions for Shingrix--to be given 2-6 months apart.

## 2018-03-31 NOTE — Patient Instructions (Signed)
Medication Instructions:  none If you need a refill on your cardiac medications before your next appointment, please call your pharmacy.   Lab work:TODAY BMET If you have labs (blood work) drawn today and your tests are completely normal, you will receive your results only by: [name] MyChart Message (if you have MyChart) OR . A paper copy in the mail If you have any lab test that is abnormal or we need to change your treatment, we will call you to review the results.  Testing/Procedures: NONE  Follow-Up: Dr Caryl Comes 06/08/2018 At [facility], you and your health needs are our priority.  As part of our continuing mission to provide you with exceptional heart care, we have created designated Provider Care Teams.  These Care Teams include your primary Cardiologist (physician) and Advanced Practice Providers (APPs -  Physician Assistants and Nurse Practitioners) who all work together to provide you with the care you need, when you need it. .   Any Other Special Instructions Will Be Listed Below (If Applicable).

## 2018-03-31 NOTE — Progress Notes (Signed)
Device check in clinic. Pt improved after recent reprogramming but still with some shortness of breath. Effective CRT pacing 80.4%. SAV delay changed from 132msec to 116msec today. Follow up with Dr Caryl Comes as scheduled

## 2018-03-31 NOTE — Assessment & Plan Note (Signed)
Assessment: [name] has hyperkeratotic and discolored toenails that are most likely due to onychomycosis. She has already been referred to podiatry. Will start her on a 12 week course of Terbinafine. AST/ALT on 11/2017 were normal. Will plan on rechecking LFTs in 1 month to check for hepatotoxicity while on systemic antifungals.  Plan: 1. Start Terbinafine 250 mg QD for 12 weeks 2. Recheck CMP in 1 month to evaluate liver function

## 2018-03-31 NOTE — Progress Notes (Signed)
Medicine attending: Medical history, presenting problems, physical findings, and medications, reviewed with resident physician Dr Nita Sickle on the day of the patient visit and I concur with her evaluation and management plan.

## 2018-03-31 NOTE — Progress Notes (Signed)
CC: Follow-up of type 2 diabetes  HPI:  RobinRobin Fields is a 63 y.o. female with hypertension, chronic systolic heart failure, CAD, CKD stage III, and type 2 diabetes who presents for follow-up of her type 2 diabetes.  Type II Diabetes At our last clinic visit [name] stated that she was not taking her Lantus as prescribed.  A1c was however well controlled at 6.6.  Insulin was discontinued to avoid hypoglycemic events.  She was continued on her Januvia 25 mg daily and canagliflozin 100 mg daily was started.  She did not wish to have a CGM placed but did agree to check her blood sugar at home. Her meter today shows average blood sugar of 140 (19 tests all in the morning) with lowest @ 120 and highest @166 .  Onychomycosis of both first toes on feet bilaterally: She states that she has had thickened toenails for over the last 6 months. She states that part of both toenails have fallen off. She does see a podiatrist that helped cut her nails.   History of Shingles: Completed a course of valacyclovir and is no longer having any symptoms. She states that the rash has completely resolved. She is inquiring about the shingles vaccine.   Past Medical History:  Diagnosis Date  . AICD (automatic cardioverter/defibrillator) present   . Chronic systolic heart failure ([place])    a. ICM b. ECHO (06/2013): EF 15%, severe MR (06/2013) c. RHC (10/2013): RA 5, RV 23/2/5, PA 25/6 (14), PCWP 12, Fick CO/CI: 3.2/1.7, PVR 0.7 WU, PA 45%, 47% and 55% (with levophed 5), vagal response during cath. d. On home milrinone.  . CKD (chronic kidney disease), stage III ([place])   . Coronary artery disease    a. s/p CABG x 5 with MV annuloplasty 2007   . Diabetes mellitus   . DVT (deep venous thrombosis) ([place]) 2009  . Hypertension   . Implantable defibrillator   medtronic   . Ischemic cardiomyopathy    a. s/p ICD. b. LHC (10/04/13): 1. Severe native CAD with all bypass grafts patent. c. CRT upgrade 12/2013.  [name] LBBB (left  bundle branch block)   . Lipoma   . PAD (peripheral artery disease) ([place])   . PICC line infection    a. 01/2014 - PICC exchanged.  . Polysubstance abuse ([place])    history of  (cocaine, tobacco and ETOH)  . Presence of permanent cardiac pacemaker   . V-tach [facility])    Review of Systems:  Review of Systems  Constitutional: Negative for chills and fever.  Respiratory: Positive for shortness of breath. Negative for cough.   Cardiovascular: Negative for chest pain and palpitations.  Skin: Negative for itching and rash.    Physical Exam:  Vitals:   03/31/18 1538  BP: 111/72  Pulse: 70  Temp: (!) 97.3 F (36.3 C)  SpO2: 98%  Weight: 193 lb 1.6 oz (87.6 kg)  Height: 5\' 4"  (1.626 m)   Physical Exam Vitals signs and nursing note reviewed.  Constitutional:      Appearance: Normal appearance.  HENT:     Head: Normocephalic and atraumatic.  Cardiovascular:     Rate and Rhythm: Normal rate and regular rhythm.  Pulmonary:     Effort: Pulmonary effort is normal. No respiratory distress.     Breath sounds: Normal breath sounds.  Abdominal:     General: Abdomen is flat. Bowel sounds are normal. There is no distension.     Palpations: Abdomen is soft.  Musculoskeletal:  Comments: Hyperkeratotic and discolored toenails of first toe on both feet. She has a large portion of each toenail missing.   Skin:    General: Skin is warm and dry.  Neurological:     Mental Status: She is alert. Mental status is at baseline.  Psychiatric:        Mood and Affect: Mood normal.        Behavior: Behavior normal.     Assessment & Plan:   See Encounters Tab for problem based charting.  Patient discussed with Dr. Beryle Beams

## 2018-03-31 NOTE — Assessment & Plan Note (Signed)
Assessment: Well-controlled on Januvia and canagliflozin alone. Will continue current regiment and plan on repeat A1c at 3 month follow-up.  Plan: 1. Continue Januvia 25 mg QD 2. Continue canagliflozin 100 mg QD 3. Repeat A1c at 3 month follow-up visit

## 2018-04-01 ENCOUNTER — Telehealth: Payer: Self-pay

## 2018-04-01 NOTE — Telephone Encounter (Signed)
-----   Message from Patsey Berthold, NP sent at 04/01/2018  9:26 AM EST ----- Please notify patient of stable labs. Thanks!

## 2018-04-01 NOTE — Telephone Encounter (Signed)
Notes recorded by Frederik Schmidt, RN on 04/01/2018 at 9:33 AM EST [id number] 1/30 ------

## 2018-04-01 NOTE — Telephone Encounter (Signed)
Notes recorded by Frederik Schmidt, RN on 04/01/2018 at 2:22 PM EST LPM that labs are stable and that if she has questions, she may call. 04/01/18 ------

## 2018-04-07 ENCOUNTER — Ambulatory Visit (HOSPITAL_COMMUNITY): Payer: Medicare Other | Attending: Internal Medicine

## 2018-04-07 DIAGNOSIS — I5022 Chronic systolic (congestive) heart failure: Secondary | ICD-10-CM | POA: Diagnosis not present

## 2018-04-08 ENCOUNTER — Other Ambulatory Visit (HOSPITAL_COMMUNITY): Payer: Self-pay | Admitting: *Deleted

## 2018-04-08 DIAGNOSIS — I5022 Chronic systolic (congestive) heart failure: Secondary | ICD-10-CM

## 2018-04-08 DIAGNOSIS — I509 Heart failure, unspecified: Secondary | ICD-10-CM | POA: Diagnosis not present

## 2018-04-08 DIAGNOSIS — I5020 Unspecified systolic (congestive) heart failure: Secondary | ICD-10-CM | POA: Diagnosis not present

## 2018-04-13 DIAGNOSIS — Z9889 Other specified postprocedural states: Secondary | ICD-10-CM | POA: Diagnosis not present

## 2018-04-13 DIAGNOSIS — Z794 Long term (current) use of insulin: Secondary | ICD-10-CM | POA: Diagnosis not present

## 2018-04-13 DIAGNOSIS — I2510 Atherosclerotic heart disease of native coronary artery without angina pectoris: Secondary | ICD-10-CM | POA: Diagnosis not present

## 2018-04-13 DIAGNOSIS — I5022 Chronic systolic (congestive) heart failure: Secondary | ICD-10-CM | POA: Diagnosis not present

## 2018-04-13 DIAGNOSIS — Z951 Presence of aortocoronary bypass graft: Secondary | ICD-10-CM | POA: Diagnosis not present

## 2018-04-13 DIAGNOSIS — E119 Type 2 diabetes mellitus without complications: Secondary | ICD-10-CM | POA: Diagnosis not present

## 2018-04-13 DIAGNOSIS — N183 Chronic kidney disease, stage 3 (moderate): Secondary | ICD-10-CM | POA: Diagnosis not present

## 2018-04-13 DIAGNOSIS — Z95810 Presence of automatic (implantable) cardiac defibrillator: Secondary | ICD-10-CM | POA: Diagnosis not present

## 2018-04-22 ENCOUNTER — Encounter: Payer: Self-pay | Admitting: Internal Medicine

## 2018-04-26 ENCOUNTER — Other Ambulatory Visit (HOSPITAL_COMMUNITY): Payer: Self-pay | Admitting: Internal Medicine

## 2018-04-28 ENCOUNTER — Other Ambulatory Visit (INDEPENDENT_AMBULATORY_CARE_PROVIDER_SITE_OTHER): Payer: Medicare Other

## 2018-04-28 DIAGNOSIS — B351 Tinea unguium: Secondary | ICD-10-CM

## 2018-04-29 LAB — CMP14 + ANION GAP
ALT: 22 IU/L (ref 0–32)
AST: 23 IU/L (ref 0–40)
Albumin/Globulin Ratio: 1.5 (ref 1.2–2.2)
Albumin: 4.4 g/dL (ref 3.8–4.9)
Alkaline Phosphatase: 76 IU/L (ref 39–117)
Anion Gap: 18 mmol/L (ref 10.0–18.0)
BUN/Creatinine Ratio: 18 (ref 12–28)
BUN: 31 mg/dL — ABNORMAL HIGH (ref 8–27)
Bilirubin Total: 0.3 mg/dL (ref 0.0–1.2)
CO2: 24 mmol/L (ref 20–29)
Calcium: 9.7 mg/dL (ref 8.7–10.3)
Chloride: 97 mmol/L (ref 96–106)
Creatinine, Ser: 1.69 mg/dL — ABNORMAL HIGH (ref 0.57–1.00)
GFR calc Af Amer: 38 mL/min/{1.73_m2} — ABNORMAL LOW (ref >59)
GFR calc non Af Amer: 33 mL/min/{1.73_m2} — ABNORMAL LOW (ref >59)
GLOBULIN, TOTAL: 2.9 g/dL (ref 1.5–4.5)
Glucose: 123 mg/dL — ABNORMAL HIGH (ref 65–99)
Potassium: 4.2 mmol/L (ref 3.5–5.2)
Sodium: 139 mmol/L (ref 134–144)
Total Protein: 7.3 g/dL (ref 6.0–8.5)

## 2018-04-30 ENCOUNTER — Other Ambulatory Visit: Payer: Self-pay

## 2018-04-30 ENCOUNTER — Telehealth: Payer: Self-pay | Admitting: *Deleted

## 2018-04-30 NOTE — Telephone Encounter (Signed)
Pt called / informed "lab stable compared with previous. Glucose better at 123. Kidney function decreased but no change." per Dr Beryle Beams.

## 2018-04-30 NOTE — Telephone Encounter (Signed)
-----   Message from Annia Belt, MD sent at 04/29/2018  9:46 AM EST ----- Call pt: lab stable compared with previous. Glucose better at 123. Kidney function decreased but no change.

## 2018-05-03 ENCOUNTER — Other Ambulatory Visit: Payer: Self-pay

## 2018-05-03 ENCOUNTER — Encounter: Payer: Self-pay | Admitting: Internal Medicine

## 2018-05-03 ENCOUNTER — Ambulatory Visit (INDEPENDENT_AMBULATORY_CARE_PROVIDER_SITE_OTHER): Payer: Medicare Other | Admitting: Internal Medicine

## 2018-05-03 VITALS — BP 110/60 | HR 66 | Ht 64.0 in | Wt 184.0 lb

## 2018-05-03 DIAGNOSIS — E032 Hypothyroidism due to medicaments and other exogenous substances: Secondary | ICD-10-CM

## 2018-05-03 DIAGNOSIS — I255 Ischemic cardiomyopathy: Secondary | ICD-10-CM

## 2018-05-03 DIAGNOSIS — T462X1A Poisoning by other antidysrhythmic drugs, accidental (unintentional), initial encounter: Secondary | ICD-10-CM

## 2018-05-03 LAB — TSH: TSH: 8.14 u[IU]/mL — ABNORMAL HIGH (ref 0.35–4.50)

## 2018-05-03 LAB — T3, FREE: T3, Free: 3 pg/mL (ref 2.3–4.2)

## 2018-05-03 LAB — T4, FREE: Free T4: 0.88 ng/dL (ref 0.60–1.60)

## 2018-05-03 NOTE — Progress Notes (Signed)
Patient ID: [name], female   DOB: 1956/01/05, 63 y.o.   MRN: [id number]    HPI  ADALAIDE Fields is a 63 y.o.-year-old female, initially referred by Dr. 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  Last visit 4 months ago.  She had shingles 2 mo ago.  Reviewed and addended history: Patient with history of mild subclinical hypothyroidism since at least 2015, however, in 07/2017 her TSH increased to higher than 10 and free T4 was low, so she started Levothyroxine 25 mcg.    She ran out of the medication 2 to 3 weeks before seeing her PCP in 11/2017.  At that time, her TFTs returned normal, so PCP did not refill her prescription for levothyroxine.  She continues off the medication.    She continues on amiodarone 100 mg daily, started many years ago.  When she was on levothyroxine, she was taking it: - in am - fasting - at least 30 min from b'fast - no Ca, Fe, MVI, PPIs - not on Biotin  At last visit, TSH was slightly high but the free T4 and free T3 levels were normal, therefore, we decided to just follow her subclinical hypothyroidism without treatment.  Reviewed patient's TFTs: Lab Results  Component Value Date   TSH 7.45 (H) 01/01/2018   TSH 4.231 11/04/2017   TSH 10.570 (H) 07/13/2017   TSH 5.470 (H) 10/03/2016   TSH 3.320 04/11/2016   TSH 4.800 (H) 11/27/2015   TSH 4.459 08/01/2015   TSH 7.524 (H) 04/06/2015   TSH 3.637 08/30/2014   TSH 4.532 (H) 02/27/2014   FREET4 0.88 01/01/2018   FREET4 1.06 11/04/2017   FREET4 0.75 (L) 07/29/2017   FREET4 1.44 10/09/2016   FREET4 1.44 11/27/2015   FREET4 0.99 08/01/2015   FREET4 1.29 02/05/2012   T3FREE 2.5 01/01/2018   T3FREE 2.1 11/04/2017   Her antithyroid antibodies were negative at last visit: Lab Results  Component Value Date   THGAB <1 01/01/2018   Component     Latest Ref Rng & Units 01/01/2018  Thyroperoxidase Ab SerPl-aCnc     <9 IU/mL 1   Patient denies: - Weight gain -  Fatigue - Cold intolerance - Constipation - Hair loss  Pt denies: - feeling nodules in neck - hoarseness - dysphagia - choking - SOB with lying down  She has no FH of thyroid disorders. No FH of thyroid cancer. No h/o radiation tx to head or neck.  No seaweed or kelp. No recent contrast studies. No herbal supplements. No Biotin use. No recent steroids use.   Pt. also has a history of CAD -s/p CABG 2007, ischemic CMP, CHF- has a pacemaker-ICD.  Stopped insulin as HbA1c controlled: Lab Results  Component Value Date   HGBA1C 6.6 (A) 03/10/2018  Started Invokana.  ROS: Constitutional: no weight gain/no weight loss, no fatigue, no subjective hyperthermia, no subjective hypothermia Eyes: no blurry vision, no xerophthalmia ENT: no sore throat, + see HPI Cardiovascular: no CP/no SOB/no palpitations/no leg swelling Respiratory: no cough/no SOB/no wheezing Gastrointestinal: no N/no V/no D/no C/no acid reflux Musculoskeletal: no muscle aches/no joint aches Skin: no rashes, no hair loss Neurological: no tremors/no numbness/no tingling/no dizziness  I reviewed pt's medications, allergies, PMH, social hx, family hx, and changes were documented in the history of present illness. Otherwise, unchanged from my initial visit note.  Past Medical History:  Diagnosis Date  . AICD (automatic cardioverter/defibrillator) present   . Chronic systolic heart failure ([place])  a. ICM b. ECHO (06/2013): EF 15%, severe MR (06/2013) c. RHC (10/2013): RA 5, RV 23/2/5, PA 25/6 (14), PCWP 12, Fick CO/CI: 3.2/1.7, PVR 0.7 WU, PA 45%, 47% and 55% (with levophed 5), vagal response during cath. d. On home milrinone.  . CKD (chronic kidney disease), stage III ([place])   . Coronary artery disease    a. s/p CABG x 5 with MV annuloplasty 2007   . Diabetes mellitus   . DVT (deep venous thrombosis) ([place]) 2009  . Hypertension   . Implantable defibrillator   medtronic   . Ischemic cardiomyopathy    a. s/p ICD. b. LHC  (10/04/13): 1. Severe native CAD with all bypass grafts patent. c. CRT upgrade 12/2013.  [name] LBBB (left bundle branch block)   . Lipoma   . PAD (peripheral artery disease) ([place])   . PICC line infection    a. 01/2014 - PICC exchanged.  . Polysubstance abuse ([place])    history of  (cocaine, tobacco and ETOH)  . Presence of permanent cardiac pacemaker   . V-tach [facility])    Past Surgical History:  Procedure Laterality Date  . ANKLE FRACTURE SURGERY Right    plate inserted  . ANKLE SURGERY Right    plate removed  . BI-VENTRICULAR IMPLANTABLE CARDIOVERTER DEFIBRILLATOR UPGRADE  12/02/2013   MDT [place] CRTD upgrade by Dr Caryl Comes  . BI-VENTRICULAR IMPLANTABLE CARDIOVERTER DEFIBRILLATOR UPGRADE N/A 12/02/2013   Procedure: BI-VENTRICULAR IMPLANTABLE CARDIOVERTER DEFIBRILLATOR UPGRADE;  Surgeon: Deboraha Sprang, MD;  Location: [facility] CATH LAB;  Service: Cardiovascular;  Laterality: N/A;  . CARDIAC CATHETERIZATION    . CARDIAC DEFIBRILLATOR PLACEMENT     2011, Followed By Dr. Caryl Comes  . COLONOSCOPY WITH PROPOFOL N/A 06/08/2014   Procedure: COLONOSCOPY WITH PROPOFOL;  Surgeon: Wilford Corner, MD;  Location: [facility] ENDOSCOPY;  Service: Endoscopy;  Laterality: N/A;  . COLONOSCOPY WITH PROPOFOL N/A 06/17/2017   Procedure: COLONOSCOPY WITH PROPOFOL;  Surgeon: Wilford Corner, MD;  Location: [place];  Service: Endoscopy;  Laterality: N/A;  . CORONARY ARTERY BYPASS GRAFT  10/2005  . ICD GENERATOR CHANGEOUT N/A 11/13/2017   Procedure: ICD GENERATOR CHANGEOUT;  Surgeon: Deboraha Sprang, MD;  Location: [place] CV LAB;  Service: Cardiovascular;  Laterality: N/A;  . LEAD REVISION/REPAIR N/A 11/13/2017   Procedure: LEAD REVISION/REPAIR;  Surgeon: Deboraha Sprang, MD;  Location: [place] CV LAB;  Service: Cardiovascular;  Laterality: N/A;  . LEAD REVISION/REPAIR N/A 02/03/2018   Procedure: LEAD REVISION/REPAIR;  Surgeon: Deboraha Sprang, MD;  Location: [place] CV LAB;  Service: Cardiovascular;  Laterality: N/A;  .  LEFT AND RIGHT HEART CATHETERIZATION WITH CORONARY/GRAFT ANGIOGRAM N/A 03/17/2011   Procedure: LEFT AND RIGHT HEART CATHETERIZATION WITH Beatrix Fetters;  Surgeon: Jolaine Artist, MD;  Location: [facility] CATH LAB;  Service: Cardiovascular;  Laterality: N/A;  . LEFT AND RIGHT HEART CATHETERIZATION WITH CORONARY/GRAFT ANGIOGRAM N/A 10/04/2013   Procedure: LEFT AND RIGHT HEART CATHETERIZATION WITH Beatrix Fetters;  Surgeon: Jolaine Artist, MD;  Location: [facility] CATH LAB;  Service: Cardiovascular;  Laterality: N/A;  . RIGHT HEART CATHETERIZATION N/A 05/23/2013   Procedure: RIGHT HEART CATH;  Surgeon: Jolaine Artist, MD;  Location: [facility] CATH LAB;  Service: Cardiovascular;  Laterality: N/A;  . RIGHT HEART CATHETERIZATION N/A 11/01/2013   Procedure: RIGHT HEART CATH;  Surgeon: Jolaine Artist, MD;  Location: [facility] CATH LAB;  Service: Cardiovascular;  Laterality: N/A;  . RIGHT HEART CATHETERIZATION N/A 11/25/2013   Procedure: RIGHT HEART CATH;  Surgeon: Jolaine Artist, MD;  Location: [place] CATH LAB;  Service: Cardiovascular;  Laterality: N/A;  . RIGHT/LEFT HEART CATH AND CORONARY/GRAFT ANGIOGRAPHY N/A 03/10/2017   Procedure: RIGHT/LEFT HEART CATH AND CORONARY/GRAFT ANGIOGRAPHY;  Surgeon: Jolaine Artist, MD;  Location: [place] CV LAB;  Service: Cardiovascular;  Laterality: N/A;  . TEE WITHOUT CARDIOVERSION N/A 06/02/2013   Procedure: TRANSESOPHAGEAL ECHOCARDIOGRAM (TEE);  Surgeon: Larey Dresser, MD;  Location: [facility] ENDOSCOPY;  Service: Cardiovascular;  Laterality: N/A;   Social History   Socioeconomic History  . Marital status: Widowed    Spouse name: Not on file  . Number of children: Not on file  . Years of education: Not on file  . Highest education level: Not on file  Occupational History  . Occupation: disable    Employer: UNEMPLOYED  Social Needs  . Financial resource strain: Not on file  . Food insecurity:    Worry: Patient refused    Inability: Patient refused  .  Transportation needs:    Medical: Patient refused    Non-medical: Patient refused  Tobacco Use  . Smoking status: Former Smoker    Years: 32.00    Types: Cigarettes    Last attempt to quit: 03/03/2011    Years since quitting: 7.1  . Smokeless tobacco: Never Used  . Tobacco comment: light smoker  Substance and Sexual Activity  . Alcohol use: No    Alcohol/week: 0.0 standard drinks  . Drug use: No    Comment: Has history of cocaine use, denies recent  . Sexual activity: Not on file  Lifestyle  . Physical activity:    Days per week: Not on file    Minutes per session: Not on file  . Stress: Not on file  Relationships  . Social connections:    Talks on phone: Not on file    Gets together: Not on file    Attends religious service: Not on file    Active member of club or organization: Not on file    Attends meetings of clubs or organizations: Not on file    Relationship status: Not on file  . Intimate partner violence:    Fear of current or ex partner: Not on file    Emotionally abused: Not on file    Physically abused: Not on file    Forced sexual activity: Not on file  Other Topics Concern  . Not on file  Social History Narrative  . Not on file   Current Outpatient Medications on File Prior to Visit  Medication Sig Dispense Refill  . ACCU-CHEK FASTCLIX LANCETS MISC Use to test blood glucose 3 times daily. Dx:E11.9 102 each 5  . acetaminophen (TYLENOL) 500 MG tablet Take 1,000 mg by mouth every 6 (six) hours as needed for moderate pain.    [name] allopurinol (ZYLOPRIM) 100 MG tablet Take 2 tablets (200 mg total) by mouth at bedtime.    [name] amiodarone (PACERONE) 200 MG tablet Take 0.5 tablets (100 mg total) by mouth daily. 15 tablet 6  . aspirin EC 81 MG tablet Take 81 mg by mouth every morning.     [name] atorvastatin (LIPITOR) 80 MG tablet TAKE 1 TABLET BY MOUTH ONCE DAILY 90 tablet 3  . Blood Glucose Monitoring Suppl (ACCU-CHEK NANO SMARTVIEW) W/DEVICE KIT Use to test blood glucose 2  times daily. Dx:E11.9 1 kit 0  . canagliflozin (INVOKANA) 100 MG TABS tablet Take 1 tablet (100 mg total) by mouth daily before breakfast. 30 tablet 2  . Cholecalciferol (VITAMIN D3) 125 MCG (5000 UT)  CAPS Take 5,000 Units by mouth daily.    . digoxin (LANOXIN) 0.125 MG tablet TAKE 0.0625 MG TABLET BY MOUTH EVERY OTHER DAY 15 tablet 3  . diphenhydrAMINE (BENADRYL) 50 MG tablet Take 1 tablet (50 mg total) by mouth every 8 (eight) hours as needed for itching. 30 tablet 0  . docusate sodium (COLACE) 100 MG capsule Take 100 mg by mouth daily as needed for mild constipation.    . furosemide (LASIX) 80 MG tablet Take 1 tablet (80 mg total) by mouth daily. May also take 0.5 tablets (40 mg total) at bedtime as needed. 45 tablet 3  . gabapentin (NEURONTIN) 100 MG capsule Take 1 capsule (100 mg total) by mouth 3 (three) times daily. 90 capsule 0  . glucose blood (ACCU-CHEK SMARTVIEW) test strip Use to test blood glucose 4 times daily (before meals and at bedtime). Dx:E11.9. INSULIN DEPENDENT 100 each 2  . Insulin Glargine (LANTUS SOLOSTAR) 100 UNIT/ML Solostar Pen Inject 30 units into the skin at bedtime.Diagnosis code:E11.8 15 pen 3  . Insulin Pen Needle 32G X 4 MM MISC Use to inject insulin 1 time a day 100 each 3  . JANUVIA 25 MG tablet TAKE 1 TABLET BY MOUTH ONCE DAILY (Patient taking differently: Take 25 mg by mouth at bedtime. ) 90 tablet 1  . losartan (COZAAR) 25 MG tablet Take 0.5 tablets (12.5 mg total) by mouth 2 (two) times daily.    . Melatonin 1 MG TABS Take 2 mg by mouth at bedtime as needed (sleep).    . metolazone (ZAROXOLYN) 2.5 MG tablet Take 1 tablet (2.5 mg total) by mouth once a week. May take an additional tablet ONLY as directed by the CHF clinic. (Patient taking differently: Take 2.5 mg by mouth every Friday. May take an additional tablet ONLY as directed by the CHF clinic.) 15 tablet 3  . neomycin-bacitracin-polymyxin (NEOSPORIN) OINT Apply 1 application topically 2 (two) times daily. 1  Tube 0  . nitroGLYCERIN (NITROSTAT) 0.4 MG SL tablet Place 1 tablet (0.4 mg total) under the tongue every 5 (five) minutes as needed. For chest pain. 25 tablet 11  . potassium chloride SA (K-DUR,KLOR-CON) 20 MEQ tablet Take 1 tablet (20 mEq total) by mouth 2 (two) times daily. 90 tablet 11  . terbinafine (LAMISIL) 250 MG tablet Take 1 tablet (250 mg total) by mouth daily. 84 tablet 0  . valACYclovir (VALTREX) 1000 MG tablet Take 1 tablet (1,000 mg total) by mouth 2 (two) times daily. 14 tablet 0  . [START ON 06/02/2018] Zoster Vaccine Adjuvanted [facility]) injection Inject 0.5 mLs into the muscle once for 1 dose. 0.5 mL 0   No current facility-administered medications on file prior to visit.    No Known Allergies Family History  Problem Relation Age of Onset  . Heart attack Mother 54  . Heart attack Father   . CVA Father   . Diabetes Maternal Grandmother   . Heart attack Cousin   . Heart attack Maternal Uncle     PE: Ht '5\' 4"'$  (1.626 m)   BMI 33.15 kg/m  Wt Readings from Last 3 Encounters:  03/31/18 193 lb 1.6 oz (87.6 kg)  03/22/18 186 lb 6.4 oz (84.6 kg)  03/10/18 190 lb 3.2 oz (86.3 kg)   Constitutional: overweight, in NAD Eyes: PERRLA, EOMI, no exophthalmos ENT: moist mucous membranes, no thyromegaly, no cervical lymphadenopathy Cardiovascular: RRR, No MRG Respiratory: CTA B Gastrointestinal: abdomen soft, NT, ND, BS+ Musculoskeletal: no deformities, strength intact in all 4  Skin: moist, warm, no rashes Neurological: no tremor with outstretched hands, DTR normal in all 4  ASSESSMENT: 1. Hypothyroidism - Possibly amiodarone induced  PLAN:  1. Patient with longstanding mild subclinical hypothyroidism -Patient has had fluctuating TFTs in the past, with TSH level slightly above the upper limit of normal, alternating with TSH levels in the high normal range.  However, in 07/2017 she had overt hypothyroidism with a TSH higher than 10 and a low free T4, so she was started on 25  mcg of levothyroxine.  She took this until her prescription ran out 3 months later.  There was a break of 2 to 3 weeks before she saw her PCP and labs drawn showed normal TFTs.  At that time, her prescription was not refilled. -When I saw her 4 months ago TFTs were normal and she had no complaints. -She continues to appear euthyroid and has no hypothyroid complaints.  We again discussed about possible signs and symptoms of hypothyroidism and also about criteria for treatment for subclinical hypothyroidism:  TSH higher than 10  TSH lower than 10 with signs or symptoms consistent with hypothyroidism  Desire for pregnancy -As of now, she does not meet this criteria -However, we will recheck her TFTs today and if her TSH is higher or if her free thyroid hormones are low, we may need to start levothyroxine. -We discussed about correct intake of levothyroxine in case we need to start: Fasting, with water, separated by at least 30 minutes from breakfast, and separated by more than 4 hours from calcium, iron, multivitamins, and acid reflux medications. - Otherwise, I will see her back in 6 months, or sooner for labs, depending on the results today  - time spent with the patient: 15 min, of which >50% was spent in obtaining information about her symptoms, reviewing her previous labs, evaluations, and treatments, counseling her about her condition (please see the discussed topics above), and developing a plan to further investigate and treat it; she had a number of questions which I addressed.  Office Visit on 05/03/2018  Component Date Value Ref Range Status  . TSH 05/03/2018 8.14* 0.35 - 4.50 uIU/mL Final  . Free T4 05/03/2018 0.88  0.60 - 1.60 ng/dL Final   Comment: Specimens from patients who are undergoing biotin therapy and /or ingesting biotin supplements may contain high levels of biotin.  The higher biotin concentration in these specimens interferes with this Free T4 assay.  Specimens that contain  high levels  of biotin may cause false high results for this Free T4 assay.  Please interpret results in light of the total clinical presentation of the patient.    . T3, Free 05/03/2018 3.0  2.3 - 4.2 pg/mL Final   TSH is slightly higher than before and closer to 10.  The free thyroid hormones are normal.  I would like to recheck her test again in 3 months.  If TSH is still high, I will suggest to start levothyroxine then.  Philemon Kingdom, MD PhD [facility] Endocrinology

## 2018-05-03 NOTE — Patient Instructions (Signed)
Please stop at the lab.  Please return in 6 months.

## 2018-05-06 ENCOUNTER — Other Ambulatory Visit: Payer: Self-pay | Admitting: Internal Medicine

## 2018-05-07 ENCOUNTER — Other Ambulatory Visit (HOSPITAL_COMMUNITY): Payer: Self-pay

## 2018-05-07 MED ORDER — ALLOPURINOL 100 MG PO TABS: 200.0000 mg | ORAL_TABLET | Freq: Every day | ORAL | 3 refills | Status: DC

## 2018-05-07 NOTE — Telephone Encounter (Signed)
Left voicemail that allipurinol was refilled

## 2018-06-08 ENCOUNTER — Encounter: Payer: Self-pay | Admitting: Internal Medicine

## 2018-06-10 ENCOUNTER — Other Ambulatory Visit: Payer: Self-pay | Admitting: *Deleted

## 2018-06-10 DIAGNOSIS — E1121 Type 2 diabetes mellitus with diabetic nephropathy: Secondary | ICD-10-CM

## 2018-06-10 MED ORDER — CANAGLIFLOZIN 100 MG PO TABS: 100.0000 mg | ORAL_TABLET | Freq: Every day | ORAL | 1 refills | Status: DC

## 2018-06-10 NOTE — Telephone Encounter (Signed)
LVM with pt for return call regarding appt on 4/17 with Caryl Comes

## 2018-06-18 ENCOUNTER — Encounter: Payer: Medicare Other | Admitting: Internal Medicine

## 2018-06-21 ENCOUNTER — Other Ambulatory Visit: Payer: Self-pay

## 2018-06-21 DIAGNOSIS — E118 Type 2 diabetes mellitus with unspecified complications: Secondary | ICD-10-CM

## 2018-06-21 MED ORDER — SITAGLIPTIN PHOSPHATE 25 MG PO TABS: 25.0000 mg | ORAL_TABLET | Freq: Every day | ORAL | 2 refills | Status: DC

## 2018-06-21 NOTE — Telephone Encounter (Signed)
JANUVIA 25 MG tablet   Refill request @  [place], [place] - [address] [phone] (Phone) [phone] (Fax)

## 2018-06-21 NOTE — Telephone Encounter (Signed)
Next appt scheduled 4/22 with PCP.

## 2018-06-23 ENCOUNTER — Other Ambulatory Visit: Payer: Self-pay

## 2018-06-23 ENCOUNTER — Encounter: Payer: Medicare Other | Admitting: Internal Medicine

## 2018-06-23 ENCOUNTER — Ambulatory Visit (INDEPENDENT_AMBULATORY_CARE_PROVIDER_SITE_OTHER): Payer: Medicare Other | Admitting: *Deleted

## 2018-06-23 DIAGNOSIS — I255 Ischemic cardiomyopathy: Secondary | ICD-10-CM

## 2018-06-23 LAB — CUP PACEART REMOTE DEVICE CHECK
Battery Remaining Longevity: 49 mo
Battery Voltage: 2.96 V
Brady Statistic AP VP Percent: 5.65 %
Brady Statistic AP VS Percent: 0.06 %
Brady Statistic AS VP Percent: 93.42 %
Brady Statistic AS VS Percent: 0.87 %
Brady Statistic RA Percent Paced: 5.64 %
Brady Statistic RV Percent Paced: 97 %
Date Time Interrogation Session: 20200422062604
HighPow Impedance: 40 Ohm
HighPow Impedance: 49 Ohm
Implantable Lead Implant Date: 20111012
Implantable Lead Implant Date: 20151002
Implantable Lead Implant Date: 20151002
Implantable Lead Location: 753858
Implantable Lead Location: 753859
Implantable Lead Location: 753860
Implantable Lead Model: 4396
Implantable Lead Model: 5076
Implantable Lead Model: 6947
Implantable Pulse Generator Implant Date: 20190913
Lead Channel Impedance Value: 437 Ohm
Lead Channel Impedance Value: 437 Ohm
Lead Channel Impedance Value: 456 Ohm
Lead Channel Impedance Value: 494 Ohm
Lead Channel Impedance Value: 513 Ohm
Lead Channel Impedance Value: 817 Ohm
Lead Channel Pacing Threshold Amplitude: 1.125 V
Lead Channel Pacing Threshold Amplitude: 1.125 V
Lead Channel Pacing Threshold Amplitude: 3.25 V
Lead Channel Pacing Threshold Pulse Width: 0.4 ms
Lead Channel Pacing Threshold Pulse Width: 0.4 ms
Lead Channel Pacing Threshold Pulse Width: 1.5 ms
Lead Channel Sensing Intrinsic Amplitude: 0.75 mV
Lead Channel Sensing Intrinsic Amplitude: 0.75 mV
Lead Channel Sensing Intrinsic Amplitude: 24.25 mV
Lead Channel Sensing Intrinsic Amplitude: 24.25 mV
Lead Channel Setting Pacing Amplitude: 2 V
Lead Channel Setting Pacing Amplitude: 2.5 V
Lead Channel Setting Pacing Amplitude: 3.25 V
Lead Channel Setting Pacing Pulse Width: 0.4 ms
Lead Channel Setting Pacing Pulse Width: 1.5 ms
Lead Channel Setting Sensing Sensitivity: 0.45 mV

## 2018-06-23 NOTE — Progress Notes (Deleted)
   CC: ***  This is a telephone encounter between [name] and [name] on 06/23/2018 for ***. The visit was conducted with the patient located at {NAMES:[id number]::"home"} and [name] at [employer]. The patient's identity was confirmed using their DOB and current address. The {WHO:[id number]::"patient","his/her legal guardian","***"} has consented to being evaluated through a telephone encounter and understands the associated risks (an examination cannot be done and the patient may need to come in for an appointment) / benefits (allows the patient to remain at home, decreasing exposure to coronavirus). I personally spent {Numbers; [phone]} minutes on medical discussion.   HPI:  [name] is a 63 y.o. with PMH as below.   Please see A&P for assessment of the patient's acute and chronic medical conditions.   Chronic systolic heart failure secondary to nonischemic cardiomyopathy s/p ICD in place: Followed by Dr. Haroldine Laws.  [place]ian regimen includes losartan 12.5 mg twice daily, digoxin 0.0625 mg daily, Lasix 80 mg daily, and potassium chloride 20 mEq daily.  Orthopnea, PND, shortness of breath, chest pain, palpitations, dizziness, lightheadedness?  Fine  CAD: On daily aspirin 81 mg  Hypertension: Meds include losartan, digoxin, and Lasix.  Last blood pressure on 05/03/2018 was controlled at 110/60.  Type 2 Diabetes: Current glycemic control medication regimen includes Januvia 25 mg daily and canagliflozin 100 mg daily.  You will need a repeat A1c at next clinic visit.  Onychomycosis: -Started terbinafine on 03/31/2018.  LFTs remained within normal limits.  Shingles: - Shing Grix vaccine?  Amiodarone-induced hypothyroidism: Followed by Dr. Cruzita Lederer with endocrinology.  She was previously on levothyroxine but stopped taking this medication.  Repeat TFTs during endocrinology appointment on 3/2 showed evaded TSH at 9.  Her free thyroid  hormones were all normal.  Plan is to recheck her TFTs in 3 months.  If TSH is still high, will restart levothyroxine.  Cirrhosis? Nodular liver contour suggesting cirrhosis. On 08/2009 abdominal US.  - Normal AST/ALT, albumin.    Past Medical History:  Diagnosis Date  . AICD (automatic cardioverter/defibrillator) present   . Chronic systolic heart failure ([place])    a. ICM b. ECHO (06/2013): EF 15%, severe MR (06/2013) c. RHC (10/2013): RA 5, RV 23/2/5, PA 25/6 (14), PCWP 12, Fick CO/CI: 3.2/1.7, PVR 0.7 WU, PA 45%, 47% and 55% (with levophed 5), vagal response during cath. d. On home milrinone.  . CKD (chronic kidney disease), stage III ([place])   . Coronary artery disease    a. s/p CABG x 5 with MV annuloplasty 2007   . Diabetes mellitus   . DVT (deep venous thrombosis) ([place]) 2009  . Hypertension   . Implantable defibrillator   medtronic   . Ischemic cardiomyopathy    a. s/p ICD. b. LHC (10/04/13): 1. Severe native CAD with all bypass grafts patent. c. CRT upgrade 12/2013.  [name] LBBB (left bundle branch block)   . Lipoma   . PAD (peripheral artery disease) ([place])   . PICC line infection    a. 01/2014 - PICC exchanged.  . Polysubstance abuse ([place])    history of  (cocaine, tobacco and ETOH)  . Presence of permanent cardiac pacemaker   . V-tach [facility])    Review of Systems:  ***    Assessment & Plan:   See Encounters Tab for problem based charting.  Patient {GC/GE:[id number]::"discussed with","seen with"} Dr. {NAMES:[id number]::"Butcher","Granfortuna","E. Hoffman","Klima","Mullen","Narendra","Raines","Vincent"}

## 2018-06-25 ENCOUNTER — Encounter (HOSPITAL_COMMUNITY): Payer: Self-pay | Admitting: Internal Medicine

## 2018-06-30 NOTE — Progress Notes (Signed)
This encounter was created in error - please disregard.

## 2018-07-01 ENCOUNTER — Encounter: Payer: Self-pay | Admitting: Cardiology

## 2018-07-01 NOTE — Progress Notes (Signed)
Remote ICD transmission.   

## 2018-07-05 ENCOUNTER — Telehealth: Payer: Self-pay | Admitting: Internal Medicine

## 2018-07-05 NOTE — Telephone Encounter (Signed)
New message    [facility] for pt to call back and schedule virtual visit with Dr. Caryl Comes per [facility] staff message.

## 2018-07-05 NOTE — Telephone Encounter (Signed)
Follow up    Returned call to pt about scheduling virtual visit. Pt set up for Friday 05.08.20 with Dr. Caryl Comes. Pt smart phone number is listed in appt notes.      Virtual Visit Pre-Appointment Phone Call  "(Name), I am calling you today to discuss your upcoming appointment. We are currently trying to limit exposure to the virus that causes COVID-19 by seeing patients at home rather than in the office."  1. "What is the BEST phone number to call the day of the visit?" - include this in appointment notes  2. Do you have or have access to (through a family member/friend) a smartphone with video capability that we can use for your visit?" a. If yes - list this number in appt notes as cell (if different from BEST phone #) and list the appointment type as a VIDEO visit in appointment notes b. If no - list the appointment type as a PHONE visit in appointment notes  3. Confirm consent - "In the setting of the current Covid19 crisis, you are scheduled for a (phone or video) visit with your provider on (date) at (time).  Just as we do with many in-office visits, in order for you to participate in this visit, we must obtain consent.  If you'd like, I can send this to your mychart (if signed up) or email for you to review.  Otherwise, I can obtain your verbal consent now.  All virtual visits are billed to your insurance company just like a normal visit would be.  By agreeing to a virtual visit, we'd like you to understand that the technology does not allow for your provider to perform an examination, and thus may limit your provider's ability to fully assess your condition. If your provider identifies any concerns that need to be evaluated in person, we will make arrangements to do so.  Finally, though the technology is pretty good, we cannot assure that it will always work on either your or our end, and in the setting of a video visit, we may have to convert it to a phone-only visit.  In either situation,  we cannot ensure that we have a secure connection.  Are you willing to proceed?" STAFF: Did the patient verbally acknowledge consent to telehealth visit? Document YES/NO here: YES  4. Advise patient to be prepared - "Two hours prior to your appointment, go ahead and check your blood pressure, pulse, oxygen saturation, and your weight (if you have the equipment to check those) and write them all down. When your visit starts, your provider will ask you for this information. If you have an Apple Watch or Kardia device, please plan to have heart rate information ready on the day of your appointment. Please have a pen and paper handy nearby the day of the visit as well."  5. Give patient instructions for MyChart download to smartphone OR Doximity/Doxy.me as below if video visit (depending on what platform provider is using)  6. Inform patient they will receive a phone call 15 minutes prior to their appointment time (may be from unknown caller ID) so they should be prepared to answer    TELEPHONE CALL NOTE  [name] has been deemed a candidate for a follow-up tele-health visit to limit community exposure during the Covid-19 pandemic. I spoke with the patient via phone to ensure availability of phone/video source, confirm preferred email & phone number, and discuss instructions and expectations.  I reminded [name] to be prepared  with any vital sign and/or heart rhythm information that could potentially be obtained via home monitoring, at the time of her visit. I reminded [name] to expect a phone call prior to her visit.  Alben Spittle 07/05/2018 4:39 PM   INSTRUCTIONS FOR DOWNLOADING THE MYCHART APP TO SMARTPHONE  - The patient must first make sure to have activated MyChart and know their login information - If Apple, go to [employer] and type in MyChart in the search bar and download the app. If Android, ask patient to go to [employer] and type in [facility] in the  search bar and download the app. The app is free but as with any other app downloads, their phone may require them to verify saved payment information or Apple/Android password.  - The patient will need to then log into the app with their MyChart username and password, and select [facility] as their healthcare provider to link the account. When it is time for your visit, go to the MyChart app, find appointments, and click Begin Video Visit. Be sure to Select Allow for your device to access the Microphone and Camera for your visit. You will then be connected, and your provider will be with you shortly.  **If they have any issues connecting, or need assistance please contact MyChart service desk [phone])**  **If using a computer, in order to ensure the best quality for their visit they will need to use either of the following Internet Browsers: [employer], or Google Chrome**  IF USING DOXIMITY or DOXY.ME - The patient will receive a link just prior to their visit by text.     FULL LENGTH CONSENT FOR TELE-HEALTH VISIT   I hereby voluntarily request, consent and authorize [facility] and its employed or contracted physicians, physician assistants, nurse practitioners or other licensed health care professionals (the Practitioner), to provide me with telemedicine health care services (the Services") as deemed necessary by the treating Practitioner. I acknowledge and consent to receive the Services by the Practitioner via telemedicine. I understand that the telemedicine visit will involve communicating with the Practitioner through live audiovisual communication technology and the disclosure of certain medical information by electronic transmission. I acknowledge that I have been given the opportunity to request an in-person assessment or other available alternative prior to the telemedicine visit and am voluntarily participating in the telemedicine visit.  I understand that I  have the right to withhold or withdraw my consent to the use of telemedicine in the course of my care at any time, without affecting my right to future care or treatment, and that the Practitioner or I may terminate the telemedicine visit at any time. I understand that I have the right to inspect all information obtained and/or recorded in the course of the telemedicine visit and may receive copies of available information for a reasonable fee.  I understand that some of the potential risks of receiving the Services via telemedicine include:   Delay or interruption in medical evaluation due to technological equipment failure or disruption;  Information transmitted may not be sufficient (e.g. poor resolution of images) to allow for appropriate medical decision making by the Practitioner; and/or   In rare instances, security protocols could fail, causing a breach of personal health information.  Furthermore, I acknowledge that it is my responsibility to provide information about my medical history, conditions and care that is complete and accurate to the best of my ability. I acknowledge that Practitioner's advice, recommendations,  and/or decision may be based on factors not within their control, such as incomplete or inaccurate data provided by me or distortions of diagnostic images or specimens that may result from electronic transmissions. I understand that the practice of medicine is not an exact science and that Practitioner makes no warranties or guarantees regarding treatment outcomes. I acknowledge that I will receive a copy of this consent concurrently upon execution via email to the email address I last provided but may also request a printed copy by calling the office of [facility].    I understand that my insurance will be billed for this visit.   I have read or had this consent read to me.  I understand the contents of this consent, which adequately explains the benefits and risks of the  Services being provided via telemedicine.   I have been provided ample opportunity to ask questions regarding this consent and the Services and have had my questions answered to my satisfaction.  I give my informed consent for the services to be provided through the use of telemedicine in my medical care  By participating in this telemedicine visit I agree to the above.

## 2018-07-05 NOTE — Telephone Encounter (Signed)
New Message   Patient returning your call please call to schedule.

## 2018-07-06 NOTE — Telephone Encounter (Signed)
LVM with pt to discuss moving her appt up to noon on Fri May 8.

## 2018-07-07 NOTE — Telephone Encounter (Signed)
Pt has graciously agreed to move her appointment to 5/15 @ 1:30pm. She cannot do any earlier appointments due to her job.   Her main complaint is an uncomfortable feeling at her ICD site. She states she has never had this issue before after any new implants. She denies any swelling or tenderness. She states "it feels like a knife stabbing me in the chest when I move in certain ways." I advised her I would let Dr Caryl Comes know and for her to be sure she discuss this with Dr Caryl Comes during her visit.   She agrees with plan and has no additional questions.

## 2018-07-07 NOTE — Telephone Encounter (Signed)
Patient returned call she said she can only do her appt at 145pm.

## 2018-07-08 NOTE — Telephone Encounter (Signed)
Noted   May be worth having her be seen by device clinic and I can video in

## 2018-07-08 NOTE — Telephone Encounter (Signed)
Video visit appt with Dr Caryl Comes 5/15. If she needs in person visit after that assessment we can schedule

## 2018-07-09 ENCOUNTER — Encounter: Payer: Self-pay | Admitting: Internal Medicine

## 2018-07-09 ENCOUNTER — Telehealth: Payer: Medicare Other | Admitting: Internal Medicine

## 2018-07-16 ENCOUNTER — Telehealth (INDEPENDENT_AMBULATORY_CARE_PROVIDER_SITE_OTHER): Payer: Medicare Other | Admitting: Internal Medicine

## 2018-07-16 ENCOUNTER — Other Ambulatory Visit: Payer: Self-pay

## 2018-07-16 VITALS — Ht 64.0 in | Wt 183.0 lb

## 2018-07-16 DIAGNOSIS — I5022 Chronic systolic (congestive) heart failure: Secondary | ICD-10-CM

## 2018-07-16 DIAGNOSIS — T829XXA Unspecified complication of cardiac and vascular prosthetic device, implant and graft, initial encounter: Secondary | ICD-10-CM | POA: Diagnosis not present

## 2018-07-16 DIAGNOSIS — Z95810 Presence of automatic (implantable) cardiac defibrillator: Secondary | ICD-10-CM | POA: Diagnosis not present

## 2018-07-16 DIAGNOSIS — I428 Other cardiomyopathies: Secondary | ICD-10-CM

## 2018-07-16 NOTE — Progress Notes (Signed)
Electrophysiology TeleHealth Note   Due to national recommendations of social distancing due to COVID 19, an audio/video telehealth visit is felt to be most appropriate for this patient at this time.  See MyChart message from today for the patient's consent to telehealth for [place] [name].   Date:  07/16/2018   ID:  [name], DOB 04/14/1955, MRN [id number]  Location: patient's home  Provider location: [address] [place]  Evaluation Performed: Follow-up visit  PCP:  [name], MD  Cardiologist:  DB Electrophysiologist:  SK   Chief Complaint:  *device site pain  History of Present Illness:    [name] is a 63 y.o. female who presents via audio/video conferencing for a telehealth visit today.  Since last being seen in our clinic, the patient underwent re-incorporation of her high threshold LV lead following his/septal lead insertion the GEN change 9/19.  This is been associated with marked deterioration in functional status which is improved following the incorporation of the LV lead.  She has had discomfort with her ICD following revision.  It is her impression that the device moves into her clavicular area and wounds as she check rolled over in bed.  There is some tenderness from her bra strap.  No erythema.  Dyspnea is significantly improved.  No nocturnal dyspnea orthopnea or peripheral edema.  The patient denies symptoms of fevers, chills, cough, or new SOB worrisome for COVID 19. *   Past Medical History:  Diagnosis Date   AICD (automatic cardioverter/defibrillator) present    Chronic systolic heart failure ([place])    a. ICM b. ECHO (06/2013): EF 15%, severe MR (06/2013) c. RHC (10/2013): RA 5, RV 23/2/5, PA 25/6 (14), PCWP 12, Fick CO/CI: 3.2/1.7, PVR 0.7 WU, PA 45%, 47% and 55% (with levophed 5), vagal response during cath. d. On home milrinone.   CKD (chronic kidney disease), stage III ([place])    Coronary artery disease    a. s/p  CABG x 5 with MV annuloplasty 2007    Diabetes mellitus    DVT (deep venous thrombosis) ([place]) 2009   Hypertension    Implantable defibrillator   medtronic    Ischemic cardiomyopathy    a. s/p ICD. b. LHC (10/04/13): 1. Severe native CAD with all bypass grafts patent. c. CRT upgrade 12/2013.   LBBB (left bundle branch block)    Lipoma    PAD (peripheral artery disease) ([place])    PICC line infection    a. 01/2014 - PICC exchanged.   Polysubstance abuse ([place])    history of  (cocaine, tobacco and ETOH)   Presence of permanent cardiac pacemaker    V-tach [facility])     Past Surgical History:  Procedure Laterality Date   ANKLE FRACTURE SURGERY Right    plate inserted   ANKLE SURGERY Right    plate removed   BI-VENTRICULAR IMPLANTABLE CARDIOVERTER DEFIBRILLATOR UPGRADE  12/02/2013   MD[facility] [place] CRTD upgrade by Dr Paulino Door IMPLANTABLE CARDIOVERTER DEFIBRILLATOR UPGRADE N/A 12/02/2013   Procedure: BI-VENTRICULAR IMPLANTABLE CARDIOVERTER DEFIBRILLATOR UPGRADE;  Surgeon: Deboraha Sprang, MD;  Location: [facility] CATH LAB;  Service: Cardiovascular;  Laterality: N/A;   CARDIAC CATHETERIZATION     CARDIAC DEFIBRILLATOR PLACEMENT     2011, Followed By Dr. Caryl Comes   COLONOSCOPY WITH PROPOFOL N/A 06/08/2014   Procedure: COLONOSCOPY WITH PROPOFOL;  Surgeon: Wilford Corner, MD;  Location: [facility] ENDOSCOPY;  Service: Endoscopy;  Laterality: N/A;   COLONOSCOPY WITH PROPOFOL N/A  06/17/2017   Procedure: COLONOSCOPY WITH PROPOFOL;  Surgeon: Wilford Corner, MD;  Location: [place];  Service: Endoscopy;  Laterality: N/A;   CORONARY ARTERY BYPASS GRAFT  10/2005   ICD GENERATOR CHANGEOUT N/A 11/13/2017   Procedure: ICD GENERATOR CHANGEOUT;  Surgeon: Deboraha Sprang, MD;  Location: [place] CV LAB;  Service: Cardiovascular;  Laterality: N/A;   LEAD REVISION/REPAIR N/A 11/13/2017   Procedure: LEAD REVISION/REPAIR;  Surgeon: Deboraha Sprang, MD;  Location: [place] CV LAB;  Service:  Cardiovascular;  Laterality: N/A;   LEAD REVISION/REPAIR N/A 02/03/2018   Procedure: LEAD REVISION/REPAIR;  Surgeon: Deboraha Sprang, MD;  Location: [place] CV LAB;  Service: Cardiovascular;  Laterality: N/A;   LEFT AND RIGHT HEART CATHETERIZATION WITH CORONARY/GRAFT ANGIOGRAM N/A 03/17/2011   Procedure: LEFT AND RIGHT HEART CATHETERIZATION WITH Beatrix Fetters;  Surgeon: Jolaine Artist, MD;  Location: [facility] CATH LAB;  Service: Cardiovascular;  Laterality: N/A;   LEFT AND RIGHT HEART CATHETERIZATION WITH CORONARY/GRAFT ANGIOGRAM N/A 10/04/2013   Procedure: LEFT AND RIGHT HEART CATHETERIZATION WITH Beatrix Fetters;  Surgeon: Jolaine Artist, MD;  Location: [facility] CATH LAB;  Service: Cardiovascular;  Laterality: N/A;   RIGHT HEART CATHETERIZATION N/A 05/23/2013   Procedure: RIGHT HEART CATH;  Surgeon: Jolaine Artist, MD;  Location: [facility] CATH LAB;  Service: Cardiovascular;  Laterality: N/A;   RIGHT HEART CATHETERIZATION N/A 11/01/2013   Procedure: RIGHT HEART CATH;  Surgeon: Jolaine Artist, MD;  Location: [facility] CATH LAB;  Service: Cardiovascular;  Laterality: N/A;   RIGHT HEART CATHETERIZATION N/A 11/25/2013   Procedure: RIGHT HEART CATH;  Surgeon: Jolaine Artist, MD;  Location: [facility] CATH LAB;  Service: Cardiovascular;  Laterality: N/A;   RIGHT/LEFT HEART CATH AND CORONARY/GRAFT ANGIOGRAPHY N/A 03/10/2017   Procedure: RIGHT/LEFT HEART CATH AND CORONARY/GRAFT ANGIOGRAPHY;  Surgeon: Jolaine Artist, MD;  Location: [place] CV LAB;  Service: Cardiovascular;  Laterality: N/A;   TEE WITHOUT CARDIOVERSION N/A 06/02/2013   Procedure: TRANSESOPHAGEAL ECHOCARDIOGRAM (TEE);  Surgeon: Larey Dresser, MD;  Location: [facility] ENDOSCOPY;  Service: Cardiovascular;  Laterality: N/A;    Current Outpatient Medications  Medication Sig Dispense Refill   ACCU-CHEK FASTCLIX LANCETS MISC Use to test blood glucose 3 times daily. Dx:E11.9 102 each 5   acetaminophen (TYLENOL) 500 MG tablet Take  1,000 mg by mouth every 6 (six) hours as needed for moderate pain.     allopurinol (ZYLOPRIM) 100 MG tablet Take 2 tablets (200 mg total) by mouth at bedtime. 60 tablet 3   amiodarone (PACERONE) 200 MG tablet Take 0.5 tablets (100 mg total) by mouth daily. 15 tablet 6   aspirin EC 81 MG tablet Take 81 mg by mouth every morning.      atorvastatin (LIPITOR) 80 MG tablet TAKE 1 TABLET BY MOUTH ONCE DAILY 90 tablet 3   Blood Glucose Monitoring Suppl (ACCU-CHEK NANO SMARTVIEW) W/DEVICE KIT Use to test blood glucose 2 times daily. Dx:E11.9 1 kit 0   canagliflozin (INVOKANA) 100 MG TABS tablet Take 1 tablet (100 mg total) by mouth daily before breakfast. 90 tablet 1   Cholecalciferol (VITAMIN D3) 125 MCG (5000 UT) CAPS Take 5,000 Units by mouth daily.     digoxin (LANOXIN) 0.125 MG tablet TAKE 0.0625 MG TABLET BY MOUTH EVERY OTHER DAY 15 tablet 3   diphenhydrAMINE (BENADRYL) 50 MG tablet Take 1 tablet (50 mg total) by mouth every 8 (eight) hours as needed for itching. 30 tablet 0   docusate sodium (COLACE) 100 MG capsule Take 100  mg by mouth daily as needed for mild constipation.     furosemide (LASIX) 80 MG tablet Take 1 tablet (80 mg total) by mouth daily. May also take 0.5 tablets (40 mg total) at bedtime as needed. 45 tablet 3   glucose blood (ACCU-CHEK SMARTVIEW) test strip Use to test blood glucose 4 times daily (before meals and at bedtime). Dx:E11.9. INSULIN DEPENDENT 100 each 2   Insulin Pen Needle 32G X 4 MM MISC Use to inject insulin 1 time a day 100 each 3   losartan (COZAAR) 25 MG tablet Take 0.5 tablets (12.5 mg total) by mouth 2 (two) times daily.     Melatonin 1 MG TABS Take 2 mg by mouth at bedtime as needed (sleep).     metolazone (ZAROXOLYN) 2.5 MG tablet Take 1 tablet (2.5 mg total) by mouth once a week. May take an additional tablet ONLY as directed by the CHF clinic. 15 tablet 3   neomycin-bacitracin-polymyxin (NEOSPORIN) OINT Apply 1 application topically 2 (two)  times daily. 1 Tube 0   nitroGLYCERIN (NITROSTAT) 0.4 MG SL tablet Place 1 tablet (0.4 mg total) under the tongue every 5 (five) minutes as needed. For chest pain. 25 tablet 11   potassium chloride SA (K-DUR,KLOR-CON) 20 MEQ tablet Take 1 tablet (20 mEq total) by mouth 2 (two) times daily. 90 tablet 11   sitaGLIPtin (JANUVIA) 25 MG tablet Take 1 tablet (25 mg total) by mouth daily. 90 tablet 2   No current facility-administered medications for this visit.     Allergies:   Patient has no known allergies.   Social History:  The patient  reports that she quit smoking about 7 years ago. Her smoking use included cigarettes. She quit after 32.00 years of use. She has never used smokeless tobacco. She reports that she does not drink alcohol or use drugs.   Family History:  The patient's   family history includes CVA in her father; Diabetes in her maternal grandmother; Heart attack in her cousin, father, and maternal uncle; Heart attack (age of onset: 35) in her mother.   ROS:  Please see the history of present illness.   All other systems are personally reviewed and negative.    Exam:    Vital Signs:  Ht _0  (1.626 m)    Wt 183 lb (83 kg)    BMI 31.41 kg/m  *   Well appearing, alert and conversant, regular work of breathing,  good skin color Eyes- anicteric, neuro- grossly intact, skin- no apparent rash or lesions or cyanosis, mouth- oral mucosa is pink Device pocket well healed; without hematoma or erythema.  There is no tethering   Labs/Other Tests and Data Reviewed:    Recent Labs: 12/09/2017: B Natriuretic Peptide 412.8 01/18/2018: Hemoglobin 12.9; Platelets 177 04/28/2018: ALT 22; BUN 31; Creatinine, Ser 1.69; Potassium 4.2; Sodium 139 05/03/2018: TSH 8.14   Wt Readings from Last 3 Encounters:  07/16/18 183 lb (83 kg)  05/03/18 184 lb (83.5 kg)  03/31/18 193 lb 1.6 oz (87.6 kg)     Other studies personally reviewed: Additional studies/ records that were reviewed today include:  As above    Last device remote is reviewed from [place] PDF dated 4/20     Normal device function,   arrhythmias - none     ASSESSMENT & PLAN:    Nonischemic/valvular cardiomyopathy  Coronary artery disease    LBBB  CHF chronic systolic class ii  Ventricular Tachycardia   Hypothyroidism  Hypertransaminasemia  Implantable defibrillator-Medtronic His Lead abandoned  High Risk Medication Surveillance  No intercurrent Ventricular tachycardia  Euvolemic by report; weight is down 10 pounds continue current meds   Normal device function  On video examination Mrs. Hoeg was unable to demonstrate movement of the device.  No apparent erythema.  The pain is concerning always for low-grade infection she is not ill.  We will plan to see her in the office in about 6-8 weeks to look at the device movement and to look for evidence of infection.  I do not recall as to whether the device was sutured down  We discussed the use of compression top    COVID 19 screen The patient denies symptoms of COVID 19 at this time.  The importance of social distancing was discussed today.  Follow-up: 6-8 w Next remote:As Scheduled   Current medicines are reviewed at length with the patient today.   The patient does not have concerns regarding her medicines.  The following changes were made today:  none  Labs/ tests ordered today include:   No orders of the defined types were placed in this encounter.   Future tests ( post COVID )     Patient Risk:  after full review of this patients clinical status, I feel that they are at moderate risk at this time.  Today, I have spent 11 minutes with the patient with telehealth technology discussing the above.  Signed, Virl Axe, MD  07/16/2018 1:52 PM     [place] [address] [phone] (office) [phone] (fax)

## 2018-07-16 NOTE — Patient Instructions (Addendum)
Medication Instructions:  Your physician recommends that you continue on your current medications as directed. Please refer to the Current Medication list given to you today.   Labwork: None ordered  Testing/Procedures: None ordered   Follow-Up: Keep Remote Device check 09/22/18 and appointment with Dr. Caryl Comes 10/12/18 at 3:00pm     Any Other Special Instructions Will Be Listed Below (If Applicable).     If you need a refill on your cardiac medications before your next appointment, please call your pharmacy.

## 2018-07-28 ENCOUNTER — Telehealth (HOSPITAL_COMMUNITY): Payer: Self-pay

## 2018-07-28 NOTE — Telephone Encounter (Signed)
Prior authorization through [place] was APPROVED for Metolazone and will expire on 03/03/19.

## 2018-07-28 NOTE — Telephone Encounter (Signed)
PA submitted to [facility] for metolazone 2.5mg  via CMM.

## 2018-08-18 ENCOUNTER — Encounter (HOSPITAL_COMMUNITY): Payer: Self-pay | Admitting: Internal Medicine

## 2018-08-18 ENCOUNTER — Ambulatory Visit (HOSPITAL_COMMUNITY)
Admission: RE | Admit: 2018-08-18 | Discharge: 2018-08-18 | Disposition: A | Discharge status: Home / Self Care | Payer: Medicare Other | Source: Ambulatory Visit | Attending: Internal Medicine | Admitting: Internal Medicine

## 2018-08-18 ENCOUNTER — Other Ambulatory Visit: Payer: Self-pay

## 2018-08-18 VITALS — BP 118/78 | HR 58 | Wt 188.6 lb

## 2018-08-18 DIAGNOSIS — I130 Hypertensive heart and chronic kidney disease with heart failure and stage 1 through stage 4 chronic kidney disease, or unspecified chronic kidney disease: Secondary | ICD-10-CM | POA: Insufficient documentation

## 2018-08-18 DIAGNOSIS — I2510 Atherosclerotic heart disease of native coronary artery without angina pectoris: Secondary | ICD-10-CM | POA: Diagnosis not present

## 2018-08-18 DIAGNOSIS — I5022 Chronic systolic (congestive) heart failure: Secondary | ICD-10-CM | POA: Diagnosis not present

## 2018-08-18 DIAGNOSIS — Z6832 Body mass index (BMI) 32.0-32.9, adult: Secondary | ICD-10-CM | POA: Insufficient documentation

## 2018-08-18 DIAGNOSIS — Z8249 Family history of ischemic heart disease and other diseases of the circulatory system: Secondary | ICD-10-CM | POA: Diagnosis not present

## 2018-08-18 DIAGNOSIS — E039 Hypothyroidism, unspecified: Secondary | ICD-10-CM | POA: Insufficient documentation

## 2018-08-18 DIAGNOSIS — Z950 Presence of cardiac pacemaker: Secondary | ICD-10-CM | POA: Diagnosis not present

## 2018-08-18 DIAGNOSIS — E1151 Type 2 diabetes mellitus with diabetic peripheral angiopathy without gangrene: Secondary | ICD-10-CM | POA: Diagnosis not present

## 2018-08-18 DIAGNOSIS — I255 Ischemic cardiomyopathy: Secondary | ICD-10-CM | POA: Diagnosis not present

## 2018-08-18 DIAGNOSIS — Z7982 Long term (current) use of aspirin: Secondary | ICD-10-CM | POA: Diagnosis not present

## 2018-08-18 DIAGNOSIS — Z794 Long term (current) use of insulin: Secondary | ICD-10-CM | POA: Insufficient documentation

## 2018-08-18 DIAGNOSIS — Z79899 Other long term (current) drug therapy: Secondary | ICD-10-CM | POA: Diagnosis not present

## 2018-08-18 DIAGNOSIS — I447 Left bundle-branch block, unspecified: Secondary | ICD-10-CM | POA: Diagnosis not present

## 2018-08-18 DIAGNOSIS — I340 Nonrheumatic mitral (valve) insufficiency: Secondary | ICD-10-CM | POA: Diagnosis not present

## 2018-08-18 DIAGNOSIS — N183 Chronic kidney disease, stage 3 (moderate): Secondary | ICD-10-CM | POA: Insufficient documentation

## 2018-08-18 DIAGNOSIS — E1122 Type 2 diabetes mellitus with diabetic chronic kidney disease: Secondary | ICD-10-CM | POA: Insufficient documentation

## 2018-08-18 DIAGNOSIS — E669 Obesity, unspecified: Secondary | ICD-10-CM | POA: Insufficient documentation

## 2018-08-18 DIAGNOSIS — Z86718 Personal history of other venous thrombosis and embolism: Secondary | ICD-10-CM | POA: Insufficient documentation

## 2018-08-18 DIAGNOSIS — Z951 Presence of aortocoronary bypass graft: Secondary | ICD-10-CM | POA: Insufficient documentation

## 2018-08-18 DIAGNOSIS — I252 Old myocardial infarction: Secondary | ICD-10-CM | POA: Diagnosis not present

## 2018-08-18 LAB — BASIC METABOLIC PANEL
Anion gap: 10 (ref 5–15)
BUN: 32 mg/dL — ABNORMAL HIGH (ref 8–23)
CO2: 25 mmol/L (ref 22–32)
Calcium: 9.5 mg/dL (ref 8.9–10.3)
Chloride: 105 mmol/L (ref 98–111)
Creatinine, Ser: 1.56 mg/dL — ABNORMAL HIGH (ref 0.44–1.00)
GFR calc Af Amer: 41 mL/min — ABNORMAL LOW (ref >60)
GFR calc non Af Amer: 35 mL/min — ABNORMAL LOW (ref >60)
Glucose, Bld: 104 mg/dL — ABNORMAL HIGH (ref 70–99)
Potassium: 4.1 mmol/L (ref 3.5–5.1)
Sodium: 140 mmol/L (ref 135–145)

## 2018-08-18 LAB — CBC
HCT: 42.3 % (ref 36.0–46.0)
Hemoglobin: 13.4 g/dL (ref 12.0–15.0)
MCH: 29.5 pg (ref 26.0–34.0)
MCHC: 31.7 g/dL (ref 30.0–36.0)
MCV: 93 fL (ref 80.0–100.0)
Platelets: 134 10*3/uL — ABNORMAL LOW (ref 150–400)
RBC: 4.55 MIL/uL (ref 3.87–5.11)
RDW: 15.1 % (ref 11.5–15.5)
WBC: 5.2 10*3/uL (ref 4.0–10.5)
nRBC: 0 % (ref 0.0–0.2)

## 2018-08-18 LAB — BRAIN NATRIURETIC PEPTIDE: B Natriuretic Peptide: 334.9 pg/mL — ABNORMAL HIGH (ref 0.0–100.0)

## 2018-08-18 NOTE — Patient Instructions (Addendum)
No medication changes today!  Labs today We will only contact you if something comes back abnormal or we need to make some changes. Otherwise no news is good news!  Your physician recommends that you schedule a follow-up appointment in:5 months with Dr Haroldine Laws  At the [facility], you and your health needs are our priority. As part of our continuing mission to provide you with exceptional heart care, we have created designated Provider Care Teams. These Care Teams include your primary Cardiologist (physician) and Advanced Practice Providers (APPs- Physician Assistants and Nurse Practitioners) who all work together to provide you with the care you need, when you need it.   You may see any of the following providers on your designated Care Team at your next follow up: [name] Dr Glori Bickers . Dr Loralie Champagne . Darrick Grinder, NP

## 2018-08-18 NOTE — Progress Notes (Signed)
Patient ID: [name], female   DOB: 1956/02/28, 63 y.o.   MRN: [id number]    Advanced Heart Failure Clinic Note  PCP: Dr. Posey Pronto  Primary HF:  Dr. Haroldine Laws  EP: Dr Beaulah Dinning Transplant : Dr Radene Knee   HPI: [name] is a 63 y.o. female with history of HTN, DM2, past polysubstance abuse (ETOH, tobacco, cocaine), CAD s/p CABG x5 with MV annuloplasty (2007), ischemic cardiomyopathy s/p Medtronic CRT-D, chronic systolic HF EF 01-02%, CKD stage III and PAD. Blood type O+.  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 At time of cath LAD, LCX and RCA occluded. EF 45%. Had PCI of LAD followed by abrupt stent occlusion. Had repeat PCI of LAD and then underwent CABG x 5 with mitral valve annuloplasty in 2007.   Amitted 8/2-10/07/13 for syncope s/p ICD shock. Concern that VT was possibly from ischemia and taken for [employer]. Started on Amiodarone. Cardiac output dropped while in hospital so started on milrinone. Discharged home. Underwent CRT-D upgrade on 12/02/13  In 12/15, she was admitted with catheter infection. She was admitted and catheter was replaced.  Milrinone titrated off in January 2016 and has done reasonably well.   Currently undergoing heart-kidney transplant eval with Dr. Radene Knee at [facility]. Had cath 03/10/17 as part of process.   Underwent Generator change and lead revision 11/13/17. Goal was for HIS bundle pacing, but ended up with septal pacing. Was feeling bad after and saw Dr. Caryl Comes on 12/11/17.    Now s/p lead revision. HIS lead was capped and LV lead was incorporated into system.   She presents today for follow up. She is feeling much better with changes to her pacing lead. NYHA II symptoms but still has pain at ICD site and feels generator moves around a lot. No fevers or chills. Denies edema, orthopnea or PND. Starts to get SOB on Thursday then takes her weekl metolazone and feels better.   ICD interrogation: Thoracic impedence  overall looks good. Increases slightly every week but then goes down with metolazone. 100% v-pacing. No VT/AF. Activity level 4 hours/day Personally reviewed    [facility] 03/10/2017  Ao = 99/59 (75) LV = 104/22 RA = 11 RV = 57/13 PA = 61/28 (39) PCW = 24 (v = 35-40) Fick cardiac output/index = 5.5/2.9 Thermo CO/CI = 6.9/3.7 PVR = 2.2 WU FA sat = 96% PA sat = 65%, 64% Assessment: 1. Severe 3v CAD 2. All 5 grafts open 3. Moderately elevated biventricular pressures with normal cardiac output. Prominent v-waves in PCW tracing c/w with significant MR  Echos: 05/19/2012  EF 15% 2/15 EF 15%, s/p MV repair with moderate-severe MR.  4/15: EF 15%, severe MR, mod TR 2/17 EF 20-25% 9/18 Echo EF 20-25% RV normal Mod MR   CPX (2/20): peak VO2 15.3 (80 % predicted) VE/VCO2 slope 42  RER 1.10 CPX (4/14): peak VO2 14.8 (68.5% predicted) VE/VCO2 slope 43  RER 1.12 CPX (3/15): peak VO2 15.6 (74.5% predicted) VE/VCO2 slope 41, RER 1.2 CPX (6/16): peak VO2:13.3 (68.6% predicted) VE/VCO2 slope:34  RER 1.12 CPX (6/17): peak VO2 13.3 (71.0% predicted) VE/VCO2 slope 41  RER 1.19   FH: Mother deceased: CAD, DM2, HTN        Father deceased: stroke  SH: Works odds/end jobs for [employer]; disabled. Lives in [place] with 2 sons([name] and [name]).   Review of systems complete and found to be negative unless listed in HPI.  Past Medical History:  Diagnosis Date  . AICD (automatic cardioverter/defibrillator) present   . Chronic systolic heart failure ([place])    a. ICM b. ECHO (06/2013): EF 15%, severe MR (06/2013) c. RHC (10/2013): RA 5, RV 23/2/5, PA 25/6 (14), PCWP 12, Fick CO/CI: 3.2/1.7, PVR 0.7 WU, PA 45%, 47% and 55% (with levophed 5), vagal response during cath. d. On home milrinone.  . CKD (chronic kidney disease), stage III ([place])   . Coronary artery disease    a. s/p CABG x 5 with MV annuloplasty 2007   . Diabetes mellitus   . DVT (deep venous thrombosis) ([place]) 2009  . Hypertension   .  Implantable defibrillator   medtronic   . Ischemic cardiomyopathy    a. s/p ICD. b. LHC (10/04/13): 1. Severe native CAD with all bypass grafts patent. c. CRT upgrade 12/2013.  [name] LBBB (left bundle branch block)   . Lipoma   . PAD (peripheral artery disease) ([place])   . PICC line infection    a. 01/2014 - PICC exchanged.  . Polysubstance abuse ([place])    history of  (cocaine, tobacco and ETOH)  . Presence of permanent cardiac pacemaker   . V-tach [facility])     Current Outpatient Medications  Medication Sig Dispense Refill  . ACCU-CHEK FASTCLIX LANCETS MISC Use to test blood glucose 3 times daily. Dx:E11.9 102 each 5  . acetaminophen (TYLENOL) 500 MG tablet Take 1,000 mg by mouth every 6 (six) hours as needed for moderate pain.    [name] allopurinol (ZYLOPRIM) 100 MG tablet Take 2 tablets (200 mg total) by mouth at bedtime. 60 tablet 3  . amiodarone (PACERONE) 200 MG tablet Take 0.5 tablets (100 mg total) by mouth daily. 15 tablet 6  . aspirin EC 81 MG tablet Take 81 mg by mouth every morning.     [name] atorvastatin (LIPITOR) 80 MG tablet TAKE 1 TABLET BY MOUTH ONCE DAILY 90 tablet 3  . Blood Glucose Monitoring Suppl (ACCU-CHEK NANO SMARTVIEW) W/DEVICE KIT Use to test blood glucose 2 times daily. Dx:E11.9 1 kit 0  . canagliflozin (INVOKANA) 100 MG TABS tablet Take 1 tablet (100 mg total) by mouth daily before breakfast. 90 tablet 1  . Cholecalciferol (VITAMIN D3) 125 MCG (5000 UT) CAPS Take 5,000 Units by mouth daily.    . digoxin (LANOXIN) 0.125 MG tablet TAKE 0.0625 MG TABLET BY MOUTH EVERY OTHER DAY 15 tablet 3  . docusate sodium (COLACE) 100 MG capsule Take 100 mg by mouth daily as needed for mild constipation.    . furosemide (LASIX) 80 MG tablet Take 1 tablet (80 mg total) by mouth daily. May also take 0.5 tablets (40 mg total) at bedtime as needed. 45 tablet 3  . glucose blood (ACCU-CHEK SMARTVIEW) test strip Use to test blood glucose 4 times daily (before meals and at bedtime). Dx:E11.9. INSULIN  DEPENDENT 100 each 2  . Insulin Pen Needle 32G X 4 MM MISC Use to inject insulin 1 time a day 100 each 3  . losartan (COZAAR) 25 MG tablet Take 0.5 tablets (12.5 mg total) by mouth 2 (two) times daily.    . Melatonin 1 MG TABS Take 2 mg by mouth at bedtime as needed (sleep).    . metolazone (ZAROXOLYN) 2.5 MG tablet Take 1 tablet (2.5 mg total) by mouth once a week. May take an additional tablet ONLY as directed by the CHF clinic. 15 tablet 3  . neomycin-bacitracin-polymyxin (NEOSPORIN) OINT Apply 1 application topically 2 (two) times  daily. 1 Tube 0  . nitroGLYCERIN (NITROSTAT) 0.4 MG SL tablet Place 1 tablet (0.4 mg total) under the tongue every 5 (five) minutes as needed. For chest pain. 25 tablet 11  . potassium chloride SA (K-DUR,KLOR-CON) 20 MEQ tablet Take 1 tablet (20 mEq total) by mouth 2 (two) times daily. 90 tablet 11  . sitaGLIPtin (JANUVIA) 25 MG tablet Take 1 tablet (25 mg total) by mouth daily. 90 tablet 2   No current facility-administered medications for this encounter.    Vitals:   08/18/18 1241  BP: 118/78  Pulse: (!) 58  SpO2: 98%  Weight: 85.5 kg (188 lb 9.6 oz)     Wt Readings from Last 3 Encounters:  08/18/18 85.5 kg (188 lb 9.6 oz)  07/16/18 83 kg (183 lb)  05/03/18 83.5 kg (184 lb)     PHYSICAL EXAM: General:  Well appearing. No resp difficulty HEENT: normal Neck: supple. no JVD. Carotids 2+ bilat; no bruits. No lymphadenopathy or thryomegaly appreciated. Cor: PMI nondisplaced. Regular rate & rhythm. No rubs, gallops or murmurs. ICD site ok  Lungs: clear Abdomen: obese soft, nontender, nondistended. No hepatosplenomegaly. No bruits or masses. Good bowel sounds. Extremities: no cyanosis, clubbing, rash, edema Neuro: alert & orientedx3, cranial nerves grossly intact. moves all 4 extremities w/o difficulty. Affect pleasant   ASSESSMENT & PLAN:  1. Chronic systolic CHF: Ischemic cardiomyopathy s/p Medtronic CRT-D, EF 20-25% ( Echo 04/2015). - Had LV lead  revision in 9/19 and unable to place lead in CS so received HIS lead placement.  - Echo 03/25/17 LVEF 20-25% (outside hospital/[facility]) with moderate MR. RV ok- Underwent lead revision 02/03/18 with cessation of HIS lead pacing and LV lead  - She follows in the transplant clinic at [facility] q 6 months with Dr. Radene Knee.  - She is blood type O - Doing very well. NYHA II. Volume status looks good on exam and Optivol. CPX 2/20 stable - Continue lasix 80 mg daily and metolazone every Friday. Can take extra 40 mg of lasix in the evening as needed.  - Intolerant entresto and carvedilol due to hypotension. Tried multiple times.  - Continue losartan 12.5 mg daily.  - [name] stopped with worsening renal function. BMET today.  - Continue digoxin 0.0625 mg daily.   2. CAD:  - Stable on recent cath 1/19 - No s/s of ischemia    - Continue ASA/statin  3. Mitral regurgitation:  - S/p mitral valve annuloplasty with CABG in 2007. Moderate to severe MR on echo in 4/15. TEE (06/2013) mitral regurgitation appeared severe, anatomy of repaired valve does not look suitable for MV clipping and would be high risk for MV replacement.  - Moderate by Echo 03/2017.  - No change.   4. CKD stage III:  - Renal US - no hydronephrosis,. - BMET today.  - Follows with [employer]  5.  NSVT:  - Continue amiodarone 100 mg.daily  - TSH elelvated on 5/13. Started thyroid medications.  - No VT noted on ICD interrogation   6. Obesity - Body mass index is 32.37 kg/m.   7. PAD:  - Symptoms stable.   - Right mid SFA stenosis per [place] 08/30/14.    8. Colon polyps - Had colonoscopy 06/08/14 with 12 polyps.  - Per PCP.   9. Hypothyroidism - TSH elevated 07/13/2017. PCP started synthroid. - Following with Endo.    Glori Bickers, MD  12:56 PM

## 2018-08-22 ENCOUNTER — Encounter: Payer: Self-pay | Admitting: *Deleted

## 2018-08-29 ENCOUNTER — Other Ambulatory Visit (HOSPITAL_COMMUNITY): Payer: Self-pay | Admitting: Internal Medicine

## 2018-08-30 ENCOUNTER — Other Ambulatory Visit (HOSPITAL_COMMUNITY): Payer: Self-pay | Admitting: Internal Medicine

## 2018-09-21 ENCOUNTER — Other Ambulatory Visit (HOSPITAL_COMMUNITY): Payer: Self-pay | Admitting: Internal Medicine

## 2018-09-21 DIAGNOSIS — I5022 Chronic systolic (congestive) heart failure: Secondary | ICD-10-CM

## 2018-09-22 ENCOUNTER — Ambulatory Visit (INDEPENDENT_AMBULATORY_CARE_PROVIDER_SITE_OTHER): Payer: Medicare Other | Admitting: *Deleted

## 2018-09-22 DIAGNOSIS — I447 Left bundle-branch block, unspecified: Secondary | ICD-10-CM | POA: Diagnosis not present

## 2018-09-22 DIAGNOSIS — I5022 Chronic systolic (congestive) heart failure: Secondary | ICD-10-CM

## 2018-09-22 LAB — CUP PACEART REMOTE DEVICE CHECK
Battery Remaining Longevity: 43 mo
Battery Voltage: 2.95 V
Brady Statistic AP VP Percent: 6.68 %
Brady Statistic AP VS Percent: 0.09 %
Brady Statistic AS VP Percent: 92.12 %
Brady Statistic AS VS Percent: 1.11 %
Brady Statistic RA Percent Paced: 6.64 %
Brady Statistic RV Percent Paced: 95.79 %
Date Time Interrogation Session: 20200722084223
HighPow Impedance: 37 Ohm
HighPow Impedance: 44 Ohm
Implantable Lead Implant Date: 20111012
Implantable Lead Implant Date: 20151002
Implantable Lead Implant Date: 20151002
Implantable Lead Location: 753858
Implantable Lead Location: 753859
Implantable Lead Location: 753860
Implantable Lead Model: 4396
Implantable Lead Model: 5076
Implantable Lead Model: 6947
Implantable Pulse Generator Implant Date: 20190913
Lead Channel Impedance Value: 380 Ohm
Lead Channel Impedance Value: 399 Ohm
Lead Channel Impedance Value: 437 Ohm
Lead Channel Impedance Value: 456 Ohm
Lead Channel Impedance Value: 513 Ohm
Lead Channel Impedance Value: 760 Ohm
Lead Channel Pacing Threshold Amplitude: 1 V
Lead Channel Pacing Threshold Amplitude: 1.125 V
Lead Channel Pacing Threshold Amplitude: 3 V
Lead Channel Pacing Threshold Pulse Width: 0.4 ms
Lead Channel Pacing Threshold Pulse Width: 0.4 ms
Lead Channel Pacing Threshold Pulse Width: 1.5 ms
Lead Channel Sensing Intrinsic Amplitude: 0.75 mV
Lead Channel Sensing Intrinsic Amplitude: 0.75 mV
Lead Channel Sensing Intrinsic Amplitude: 14.5 mV
Lead Channel Sensing Intrinsic Amplitude: 14.5 mV
Lead Channel Setting Pacing Amplitude: 2 V
Lead Channel Setting Pacing Amplitude: 2.5 V
Lead Channel Setting Pacing Amplitude: 3.25 V
Lead Channel Setting Pacing Pulse Width: 0.4 ms
Lead Channel Setting Pacing Pulse Width: 1.5 ms
Lead Channel Setting Sensing Sensitivity: 0.45 mV

## 2018-09-27 ENCOUNTER — Telehealth: Payer: Self-pay | Admitting: *Deleted

## 2018-09-27 NOTE — Telephone Encounter (Signed)
Called [facility] - stated someone else had called and the ok to change mfg.  I called pt about change in mfg and checking digoxin in a couple of weeks after starting new brand. Stated she had called [place] about this but I do not see a telephone encounter. But stated she's not at home and will call back tomorrow to schedule lab appt per Dr Darrick Meigs.

## 2018-09-27 NOTE — Telephone Encounter (Signed)
Call from [facility] - stated the type of Digoxin pt has been receiving is no longer available. The pharmacy needs the ok to switch to another manufacturer which they are now using. Thanks

## 2018-09-30 ENCOUNTER — Telehealth (HOSPITAL_COMMUNITY): Payer: Self-pay | Admitting: Cardiology

## 2018-09-30 DIAGNOSIS — I5022 Chronic systolic (congestive) heart failure: Secondary | ICD-10-CM

## 2018-09-30 NOTE — Telephone Encounter (Signed)
Pt returned call to schedule lab appt With recent changes to digoxin manufacturer it was ordered by Dr Darrick Meigs to have a dig level drawn  Labs appt 8/13 @ 145

## 2018-10-04 ENCOUNTER — Other Ambulatory Visit (HOSPITAL_COMMUNITY): Payer: Self-pay | Admitting: Internal Medicine

## 2018-10-06 NOTE — Progress Notes (Signed)
Remote ICD transmission.   

## 2018-10-12 ENCOUNTER — Other Ambulatory Visit: Payer: Self-pay

## 2018-10-12 ENCOUNTER — Ambulatory Visit (INDEPENDENT_AMBULATORY_CARE_PROVIDER_SITE_OTHER): Payer: Medicare Other | Admitting: Internal Medicine

## 2018-10-12 ENCOUNTER — Encounter: Payer: Self-pay | Admitting: Internal Medicine

## 2018-10-12 VITALS — BP 106/62 | HR 71 | Ht 64.0 in | Wt 188.4 lb

## 2018-10-12 DIAGNOSIS — I5022 Chronic systolic (congestive) heart failure: Secondary | ICD-10-CM | POA: Diagnosis not present

## 2018-10-12 DIAGNOSIS — E039 Hypothyroidism, unspecified: Secondary | ICD-10-CM

## 2018-10-12 DIAGNOSIS — I447 Left bundle-branch block, unspecified: Secondary | ICD-10-CM

## 2018-10-12 DIAGNOSIS — Z95810 Presence of automatic (implantable) cardiac defibrillator: Secondary | ICD-10-CM | POA: Diagnosis not present

## 2018-10-12 DIAGNOSIS — I255 Ischemic cardiomyopathy: Secondary | ICD-10-CM

## 2018-10-12 DIAGNOSIS — I428 Other cardiomyopathies: Secondary | ICD-10-CM | POA: Diagnosis not present

## 2018-10-12 LAB — CUP PACEART INCLINIC DEVICE CHECK
Date Time Interrogation Session: 20200811163835
Implantable Lead Implant Date: 20111012
Implantable Lead Implant Date: 20151002
Implantable Lead Implant Date: 20151002
Implantable Lead Location: 753858
Implantable Lead Location: 753859
Implantable Lead Location: 753860
Implantable Lead Model: 4396
Implantable Lead Model: 5076
Implantable Lead Model: 6947
Implantable Pulse Generator Implant Date: 20190913

## 2018-10-12 NOTE — Progress Notes (Signed)
Patient Care Team: Mitzi Hansen, MD as PCP - General Bensimhon, Shaune Pascal, MD as PCP - Cardiology (Cardiology) Deboraha Sprang, MD (Cardiology) Bensimhon, Shaune Pascal, MD (Cardiology)   HPI  [name] is a 63 y.o. 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  She has a history of inappropriate shocks 8/15  Started with amiodarone. She underwent CRT upgrade at that time. Catheterization 2013-January demonstrated patent grafts cardiac index 2.2 ejection fraction 10-15%  Electrocardiogram April 2013 demonstrated a QRS duration of 124 ms repeat today is 126.   She's been seen at [facility] for consideration of transplant evaluation of heart and possibly kidney.     DATE TEST    2/13 Cath   EF 10-15 %   9/15 LHC  Patent graft  2/17 Echo   EF 20-25% %   9/18 Echo  Ef 20-25%   1/19 LHC  3V CAD patent grafts   Date TSH LFTs Cr K Dig  9/17  4.8 48       2/18   1.73    12/18 <0.015(1/19) 69 1.63    3/19   1.48 4.8   10/19 7.45 24 1.58 3.6   6/20 (3/20)8.14 22 1.56 4.1   \ As her device had reaching ERI, we undertook His lead placement.  Unfortunately we ended up with septal pacing and she has been considerably more short of breath   She underwent reattachment of LV lead. 12/19   She has much less SOB no edema and fatigue is also better   Still co of device movement when she is flat on her back and rolls to the side   Past Medical History:  Diagnosis Date  . AICD (automatic cardioverter/defibrillator) present   . Chronic systolic heart failure ([place])    a. ICM b. ECHO (06/2013): EF 15%, severe MR (06/2013) c. RHC (10/2013): RA 5, RV 23/2/5, PA 25/6 (14), PCWP 12, Fick CO/CI: 3.2/1.7, PVR 0.7 WU, PA 45%, 47% and 55% (with levophed 5), vagal response during cath. d. On home milrinone.  . CKD (chronic kidney disease), stage III ([place])   . Coronary artery  disease    a. s/p CABG x 5 with MV annuloplasty 2007   . Diabetes mellitus   . DVT (deep venous thrombosis) ([place]) 2009  . Hypertension   . Implantable defibrillator   medtronic   . Ischemic cardiomyopathy    a. s/p ICD. b. LHC (10/04/13): 1. Severe native CAD with all bypass grafts patent. c. CRT upgrade 12/2013.  [name] LBBB (left bundle branch block)   . Lipoma   . PAD (peripheral artery disease) ([employer])   . PICC line infection    a. 01/2014 - PICC exchanged.  . Polysubstance abuse ([place])    history of  (cocaine, tobacco and ETOH)  . Presence of permanent cardiac pacemaker   . V-tach [facility])     Past Surgical History:  Procedure Laterality Date  . ANKLE FRACTURE SURGERY Right    plate inserted  . ANKLE SURGERY Right    plate removed  . BI-VENTRICULAR IMPLANTABLE CARDIOVERTER DEFIBRILLATOR UPGRADE  12/02/2013   [facility] [place] CRTD upgrade by Dr Caryl Comes  . BI-VENTRICULAR IMPLANTABLE CARDIOVERTER DEFIBRILLATOR UPGRADE N/A 12/02/2013   Procedure: BI-VENTRICULAR IMPLANTABLE CARDIOVERTER DEFIBRILLATOR UPGRADE;  Surgeon: Deboraha Sprang, MD;  [facility] CATH LAB;  Service: Cardiovascular;  Laterality: N/A;  . CARDIAC CATHETERIZATION    . CARDIAC DEFIBRILLATOR PLACEMENT     2011, Followed By Dr. Caryl Comes  . COLONOSCOPY WITH PROPOFOL N/A 06/08/2014   Procedure: COLONOSCOPY WITH PROPOFOL;  Surgeon: Wilford Corner, MD;  Location: [facility] ENDOSCOPY;  Service: Endoscopy;  Laterality: N/A;  . COLONOSCOPY WITH PROPOFOL N/A 06/17/2017   Procedure: COLONOSCOPY WITH PROPOFOL;  Surgeon: Wilford Corner, MD;  Location: [place];  Service: Endoscopy;  Laterality: N/A;  . CORONARY ARTERY BYPASS GRAFT  10/2005  . ICD GENERATOR CHANGEOUT N/A 11/13/2017   Procedure: ICD GENERATOR CHANGEOUT;  Surgeon: Deboraha Sprang, MD;  Location: [place] CV LAB;  Service: Cardiovascular;  Laterality: N/A;  . LEAD REVISION/REPAIR N/A 11/13/2017   Procedure: LEAD REVISION/REPAIR;  Surgeon: Deboraha Sprang, MD;  Location: [place] CV  LAB;  Service: Cardiovascular;  Laterality: N/A;  . LEAD REVISION/REPAIR N/A 02/03/2018   Procedure: LEAD REVISION/REPAIR;  Surgeon: Deboraha Sprang, MD;  Location: [place] CV LAB;  Service: Cardiovascular;  Laterality: N/A;  . LEFT AND RIGHT HEART CATHETERIZATION WITH CORONARY/GRAFT ANGIOGRAM N/A 03/17/2011   Procedure: LEFT AND RIGHT HEART CATHETERIZATION WITH Beatrix Fetters;  Surgeon: Jolaine Artist, MD;  Location: [facility] CATH LAB;  Service: Cardiovascular;  Laterality: N/A;  . LEFT AND RIGHT HEART CATHETERIZATION WITH CORONARY/GRAFT ANGIOGRAM N/A 10/04/2013   Procedure: LEFT AND RIGHT HEART CATHETERIZATION WITH Beatrix Fetters;  Surgeon: Jolaine Artist, MD;  Location: [facility] CATH LAB;  Service: Cardiovascular;  Laterality: N/A;  . RIGHT HEART CATHETERIZATION N/A 05/23/2013   Procedure: RIGHT HEART CATH;  Surgeon: Jolaine Artist, MD;  Location: [facility] CATH LAB;  Service: Cardiovascular;  Laterality: N/A;  . RIGHT HEART CATHETERIZATION N/A 11/01/2013   Procedure: RIGHT HEART CATH;  Surgeon: Jolaine Artist, MD;  Location: [facility] CATH LAB;  Service: Cardiovascular;  Laterality: N/A;  . RIGHT HEART CATHETERIZATION N/A 11/25/2013   Procedure: RIGHT HEART CATH;  Surgeon: Jolaine Artist, MD;  Location: [facility] CATH LAB;  Service: Cardiovascular;  Laterality: N/A;  . RIGHT/LEFT HEART CATH AND CORONARY/GRAFT ANGIOGRAPHY N/A 03/10/2017   Procedure: RIGHT/LEFT HEART CATH AND CORONARY/GRAFT ANGIOGRAPHY;  Surgeon: Jolaine Artist, MD;  Location: [place] CV LAB;  Service: Cardiovascular;  Laterality: N/A;  . TEE WITHOUT CARDIOVERSION N/A 06/02/2013   Procedure: TRANSESOPHAGEAL ECHOCARDIOGRAM (TEE);  Surgeon: Larey Dresser, MD;  Location: [facility] ENDOSCOPY;  Service: Cardiovascular;  Laterality: N/A;    Current Outpatient Medications  Medication Sig Dispense Refill  . ACCU-CHEK FASTCLIX LANCETS MISC Use to test blood glucose 3 times daily. Dx:E11.9 102 each 5  . acetaminophen (TYLENOL) 500  MG tablet Take 1,000 mg by mouth every 6 (six) hours as needed for moderate pain.    [name] allopurinol (ZYLOPRIM) 100 MG tablet TAKE 2 TABLETS BY MOUTH AT BEDTIME 60 tablet 0  . amiodarone (PACERONE) 200 MG tablet Take 1/2 (one-half) tablet by mouth once daily 15 tablet 0  . aspirin EC 81 MG tablet Take 81 mg by mouth every morning.     [name] atorvastatin (LIPITOR) 80 MG tablet TAKE 1 TABLET BY MOUTH ONCE DAILY 90 tablet 3  . Blood Glucose Monitoring Suppl (ACCU-CHEK NANO SMARTVIEW) W/DEVICE KIT Use to test blood glucose 2 times daily. Dx:E11.9 1 kit 0  . canagliflozin (INVOKANA) 100 MG TABS tablet Take 1 tablet (100 mg total) by mouth daily before breakfast. 90 tablet 1  . Cholecalciferol (VITAMIN D3) 125 MCG (5000 UT) CAPS Take 5,000 Units by mouth daily.    . digoxin (  LANOXIN) 0.125 MG tablet TAKE 0.0625 MG TABLET BY MOUTH EVERY OTHER DAY 15 tablet 3  . docusate sodium (COLACE) 100 MG capsule Take 100 mg by mouth daily as needed for mild constipation.    . furosemide (LASIX) 80 MG tablet Take 1 tablet (80 mg total) by mouth daily. May also take 0.5 tablets (40 mg total) at bedtime as needed. 45 tablet 3  . glucose blood (ACCU-CHEK SMARTVIEW) test strip Use to test blood glucose 4 times daily (before meals and at bedtime). Dx:E11.9. INSULIN DEPENDENT 100 each 2  . Insulin Pen Needle 32G X 4 MM MISC Use to inject insulin 1 time a day 100 each 3  . losartan (COZAAR) 25 MG tablet Take 1/2 (one-half) tablet by mouth twice daily 60 tablet 0  . Melatonin 1 MG TABS Take 2 mg by mouth at bedtime as needed (sleep).    . metolazone (ZAROXOLYN) 2.5 MG tablet Take 1 tablet (2.5 mg total) by mouth once a week. May take an additional tablet ONLY as directed by the CHF clinic. 15 tablet 3  . neomycin-bacitracin-polymyxin (NEOSPORIN) OINT Apply 1 application topically 2 (two) times daily. 1 Tube 0  . nitroGLYCERIN (NITROSTAT) 0.4 MG SL tablet Place 1 tablet (0.4 mg total) under the tongue every 5 (five) minutes as  needed. For chest pain. 25 tablet 11  . potassium chloride SA (K-DUR,KLOR-CON) 20 MEQ tablet Take 1 tablet (20 mEq total) by mouth 2 (two) times daily. 90 tablet 11  . sitaGLIPtin (JANUVIA) 25 MG tablet Take 1 tablet (25 mg total) by mouth daily. 90 tablet 2   No current facility-administered medications for this visit.     No Known Allergies    Review of Systems negative except from HPI and PMH  Physical Exam  BP 106/62   Pulse 71   Ht '5\' 4"'$  (1.626 m)   Wt 188 lb 6.4 oz (85.5 kg)   SpO2 97%   BMI 32.34 kg/m   Well developed and well nourished in no acute distress HENT normal Neck supple with JVP-flat Clear Device pocket well healed; without hematoma or erythema.  There is no tethering  Regular rate and rhythm, no  murmur Abd-soft with active BS No Clubbing cyanosis   edema Skin-warm and dry A & Oriented  Grossly normal sensory and motor function  ECG P-synchronous/ AV  pacing with rS V1 and qR lead 1    Nonischemic/valvular cardiomyopathy  Coronary artery disease Without symptoms of ischemia  LBBB  CHF chronic systolic class II  Ventricular Tachycardia   Hyperthyroidism>>  hypothyoidism  Implantable defibrillator-Medtronic CRT with abandoned His lead  Device movement   High Risk Medication Surveillance   doing better with device revision and incorporation of LV lead  Will check TSH  Check dig level  Euvolemic continue current meds  No intercurrent Ventricular tachycardia   Discussed w MH Medtronic and pt clothing strategies that might obviate or minimize device movement   We spent more than 50% of our >25 min visit in face to face counseling regarding the above

## 2018-10-12 NOTE — Patient Instructions (Signed)
Medication Instructions:  Your physician recommends that you continue on your current medications as directed. Please refer to the Current Medication list given to you today.  Labwork: You will have labs drawn today: TSH   Testing/Procedures: None ordered.  Follow-Up: Your physician recommends that you schedule a follow-up appointment in:   6 months with Dr. Caryl Comes   Any Other Special Instructions Will Be Listed Below (If Applicable).     If you need a refill on your cardiac medications before your next appointment, please call your pharmacy.

## 2018-10-13 ENCOUNTER — Other Ambulatory Visit: Payer: Self-pay | Admitting: Internal Medicine

## 2018-10-13 DIAGNOSIS — Z1231 Encounter for screening mammogram for malignant neoplasm of breast: Secondary | ICD-10-CM

## 2018-10-13 LAB — DIGOXIN LEVEL: Digoxin, Serum: 0.5 ng/mL (ref 0.5–0.9)

## 2018-10-13 LAB — TSH: TSH: 8.12 u[IU]/mL — ABNORMAL HIGH (ref 0.450–4.500)

## 2018-10-14 ENCOUNTER — Other Ambulatory Visit (HOSPITAL_COMMUNITY): Payer: Medicare Other

## 2018-10-26 ENCOUNTER — Telehealth: Payer: Self-pay

## 2018-10-26 ENCOUNTER — Other Ambulatory Visit (HOSPITAL_COMMUNITY): Payer: Self-pay | Admitting: Internal Medicine

## 2018-10-26 DIAGNOSIS — T462X1A Poisoning by other antidysrhythmic drugs, accidental (unintentional), initial encounter: Secondary | ICD-10-CM

## 2018-10-26 DIAGNOSIS — Z79899 Other long term (current) drug therapy: Secondary | ICD-10-CM

## 2018-10-26 DIAGNOSIS — E032 Hypothyroidism due to medicaments and other exogenous substances: Secondary | ICD-10-CM

## 2018-10-26 MED ORDER — LEVOTHYROXINE SODIUM 25 MCG PO TABS: 25.0000 ug | ORAL_TABLET | Freq: Every day | ORAL | 3 refills | Status: AC

## 2018-10-26 NOTE — Telephone Encounter (Signed)
Notes recorded by Frederik Schmidt, RN on 10/26/2018 at 8:21 AM EDT  The patient has been notified of the result and verbalized understanding. All questions (if any) were answered.  Frederik Schmidt, RN 10/26/2018 8:21 AM

## 2018-10-26 NOTE — Telephone Encounter (Signed)
-----   Message from Dollene Primrose, RN sent at 10/25/2018  5:44 PM EDT -----  ----- Message ----- From: Deboraha Sprang, MD Sent: 10/23/2018   1:46 PM EDT To: Emily Filbert, RN, Dollene Primrose, RN  Please Inform Patient  Dig level is normal  But TSH remains slightly elevated   Lets begin her on levothyroxine @ 25 mcg daily and recheck her TSH in about 8 weeks plx  Thanks

## 2018-11-02 DIAGNOSIS — Z779 Other contact with and (suspected) exposures hazardous to health: Secondary | ICD-10-CM | POA: Diagnosis not present

## 2018-11-02 DIAGNOSIS — Z113 Encounter for screening for infections with a predominantly sexual mode of transmission: Secondary | ICD-10-CM | POA: Diagnosis not present

## 2018-11-02 DIAGNOSIS — Z6832 Body mass index (BMI) 32.0-32.9, adult: Secondary | ICD-10-CM | POA: Diagnosis not present

## 2018-11-02 DIAGNOSIS — Z124 Encounter for screening for malignant neoplasm of cervix: Secondary | ICD-10-CM | POA: Diagnosis not present

## 2018-11-05 ENCOUNTER — Ambulatory Visit: Payer: Self-pay | Admitting: Internal Medicine

## 2018-11-08 ENCOUNTER — Other Ambulatory Visit (HOSPITAL_COMMUNITY): Payer: Self-pay | Admitting: Internal Medicine

## 2018-11-09 ENCOUNTER — Other Ambulatory Visit (HOSPITAL_COMMUNITY): Payer: Self-pay | Admitting: Internal Medicine

## 2018-11-09 DIAGNOSIS — N760 Acute vaginitis: Secondary | ICD-10-CM | POA: Diagnosis not present

## 2018-11-09 DIAGNOSIS — Z113 Encounter for screening for infections with a predominantly sexual mode of transmission: Secondary | ICD-10-CM | POA: Diagnosis not present

## 2018-11-16 ENCOUNTER — Other Ambulatory Visit (HOSPITAL_COMMUNITY): Payer: Self-pay | Admitting: Internal Medicine

## 2018-11-16 DIAGNOSIS — I5022 Chronic systolic (congestive) heart failure: Secondary | ICD-10-CM

## 2018-11-20 ENCOUNTER — Other Ambulatory Visit (HOSPITAL_COMMUNITY): Payer: Self-pay | Admitting: Internal Medicine

## 2018-11-22 ENCOUNTER — Other Ambulatory Visit (HOSPITAL_COMMUNITY): Payer: Self-pay

## 2018-11-22 ENCOUNTER — Other Ambulatory Visit (HOSPITAL_COMMUNITY): Payer: Self-pay | Admitting: *Deleted

## 2018-11-22 MED ORDER — METOLAZONE 2.5 MG PO TABS: 2.5000 mg | ORAL_TABLET | ORAL | 3 refills | Status: DC

## 2018-11-22 MED ORDER — METOLAZONE 2.5 MG PO TABS: 2.5000 mg | ORAL_TABLET | Freq: Every day | ORAL | 3 refills | Status: DC

## 2018-11-23 DIAGNOSIS — A599 Trichomoniasis, unspecified: Secondary | ICD-10-CM | POA: Diagnosis not present

## 2018-11-29 ENCOUNTER — Ambulatory Visit
Admission: RE | Admit: 2018-11-29 | Discharge: 2018-11-29 | Disposition: A | Discharge status: Home / Self Care | Payer: Medicare Other | Source: Ambulatory Visit | Attending: Internal Medicine | Admitting: Internal Medicine

## 2018-11-29 ENCOUNTER — Other Ambulatory Visit: Payer: Self-pay

## 2018-11-29 DIAGNOSIS — Z1231 Encounter for screening mammogram for malignant neoplasm of breast: Secondary | ICD-10-CM | POA: Diagnosis not present

## 2018-12-07 ENCOUNTER — Other Ambulatory Visit (HOSPITAL_COMMUNITY): Payer: Self-pay | Admitting: Internal Medicine

## 2018-12-07 ENCOUNTER — Other Ambulatory Visit: Payer: Self-pay

## 2018-12-07 ENCOUNTER — Ambulatory Visit (INDEPENDENT_AMBULATORY_CARE_PROVIDER_SITE_OTHER): Payer: Medicare Other | Admitting: *Deleted

## 2018-12-07 DIAGNOSIS — Z23 Encounter for immunization: Secondary | ICD-10-CM | POA: Diagnosis not present

## 2018-12-23 ENCOUNTER — Telehealth: Payer: Self-pay | Admitting: Internal Medicine

## 2018-12-23 ENCOUNTER — Ambulatory Visit (INDEPENDENT_AMBULATORY_CARE_PROVIDER_SITE_OTHER): Payer: Medicare Other | Admitting: *Deleted

## 2018-12-23 DIAGNOSIS — I5022 Chronic systolic (congestive) heart failure: Secondary | ICD-10-CM

## 2018-12-23 DIAGNOSIS — I447 Left bundle-branch block, unspecified: Secondary | ICD-10-CM | POA: Diagnosis not present

## 2018-12-23 LAB — CUP PACEART REMOTE DEVICE CHECK
Battery Remaining Longevity: 34 mo
Battery Voltage: 2.94 V
Brady Statistic AP VP Percent: 6.13 %
Brady Statistic AP VS Percent: 0.07 %
Brady Statistic AS VP Percent: 92.62 %
Brady Statistic AS VS Percent: 1.18 %
Brady Statistic RA Percent Paced: 6.1 %
Brady Statistic RV Percent Paced: 96.23 %
Date Time Interrogation Session: 20201022041802
HighPow Impedance: 40 Ohm
HighPow Impedance: 47 Ohm
Implantable Lead Implant Date: 20111012
Implantable Lead Implant Date: 20151002
Implantable Lead Implant Date: 20151002
Implantable Lead Location: 753858
Implantable Lead Location: 753859
Implantable Lead Location: 753860
Implantable Lead Model: 4396
Implantable Lead Model: 5076
Implantable Lead Model: 6947
Implantable Pulse Generator Implant Date: 20190913
Lead Channel Impedance Value: 380 Ohm
Lead Channel Impedance Value: 437 Ohm
Lead Channel Impedance Value: 437 Ohm
Lead Channel Impedance Value: 494 Ohm
Lead Channel Impedance Value: 513 Ohm
Lead Channel Impedance Value: 817 Ohm
Lead Channel Pacing Threshold Amplitude: 1.25 V
Lead Channel Pacing Threshold Amplitude: 1.25 V
Lead Channel Pacing Threshold Amplitude: 3 V
Lead Channel Pacing Threshold Pulse Width: 0.4 ms
Lead Channel Pacing Threshold Pulse Width: 0.4 ms
Lead Channel Pacing Threshold Pulse Width: 1.5 ms
Lead Channel Sensing Intrinsic Amplitude: 0.625 mV
Lead Channel Sensing Intrinsic Amplitude: 0.625 mV
Lead Channel Sensing Intrinsic Amplitude: 27.375 mV
Lead Channel Sensing Intrinsic Amplitude: 27.375 mV
Lead Channel Setting Pacing Amplitude: 2 V
Lead Channel Setting Pacing Amplitude: 2.5 V
Lead Channel Setting Pacing Amplitude: 3.5 V
Lead Channel Setting Pacing Pulse Width: 0.4 ms
Lead Channel Setting Pacing Pulse Width: 1.5 ms
Lead Channel Setting Sensing Sensitivity: 0.45 mV

## 2018-12-23 NOTE — Telephone Encounter (Signed)
New Message      1. Has your device fired? NO  2. Is you device beeping? Making a rotary phone sound  3. Are you experiencing draining or swelling at device site? No  4. Are you calling to see if we received your device transmission? NO  5. Have you passed out? NO    Please route to [place]

## 2018-12-23 NOTE — Telephone Encounter (Signed)
Transmission from this morning reviewed. Normal device function, no episodes. No alerts. Pt reassured.  Chanetta Marshall, NP 12/23/2018 12:35 PM

## 2018-12-24 ENCOUNTER — Other Ambulatory Visit: Payer: Self-pay

## 2018-12-24 ENCOUNTER — Other Ambulatory Visit: Payer: Medicare Other | Admitting: *Deleted

## 2018-12-24 DIAGNOSIS — T462X1A Poisoning by other antidysrhythmic drugs, accidental (unintentional), initial encounter: Secondary | ICD-10-CM

## 2018-12-24 DIAGNOSIS — Z79899 Other long term (current) drug therapy: Secondary | ICD-10-CM | POA: Diagnosis not present

## 2018-12-24 DIAGNOSIS — E032 Hypothyroidism due to medicaments and other exogenous substances: Secondary | ICD-10-CM

## 2018-12-25 LAB — TSH: TSH: 4.13 u[IU]/mL (ref 0.450–4.500)

## 2018-12-27 ENCOUNTER — Other Ambulatory Visit: Payer: Medicare Other

## 2018-12-28 ENCOUNTER — Other Ambulatory Visit (HOSPITAL_COMMUNITY): Payer: Self-pay | Admitting: Internal Medicine

## 2018-12-29 ENCOUNTER — Other Ambulatory Visit: Payer: Self-pay | Admitting: *Deleted

## 2018-12-29 DIAGNOSIS — E1121 Type 2 diabetes mellitus with diabetic nephropathy: Secondary | ICD-10-CM

## 2018-12-29 MED ORDER — CANAGLIFLOZIN 100 MG PO TABS: 100.0000 mg | ORAL_TABLET | Freq: Every day | ORAL | 1 refills | Status: DC

## 2018-12-29 NOTE — Telephone Encounter (Signed)
Next appt scheduled 03/15/19 with PCP.

## 2018-12-31 ENCOUNTER — Encounter: Payer: Self-pay | Admitting: Internal Medicine

## 2019-01-04 NOTE — Progress Notes (Signed)
Remote ICD transmission.   

## 2019-01-19 ENCOUNTER — Other Ambulatory Visit (HOSPITAL_COMMUNITY): Payer: Self-pay | Admitting: Internal Medicine

## 2019-01-19 DIAGNOSIS — I5022 Chronic systolic (congestive) heart failure: Secondary | ICD-10-CM

## 2019-01-21 ENCOUNTER — Encounter: Payer: Self-pay | Admitting: Internal Medicine

## 2019-01-21 ENCOUNTER — Ambulatory Visit (INDEPENDENT_AMBULATORY_CARE_PROVIDER_SITE_OTHER): Payer: Medicare Other | Admitting: Internal Medicine

## 2019-01-21 ENCOUNTER — Other Ambulatory Visit: Payer: Self-pay

## 2019-01-21 VITALS — BP 116/66 | HR 65 | Temp 97.8°F | Ht 64.0 in | Wt 186.6 lb

## 2019-01-21 DIAGNOSIS — I152 Hypertension secondary to endocrine disorders: Secondary | ICD-10-CM

## 2019-01-21 DIAGNOSIS — E118 Type 2 diabetes mellitus with unspecified complications: Secondary | ICD-10-CM

## 2019-01-21 DIAGNOSIS — E1159 Type 2 diabetes mellitus with other circulatory complications: Secondary | ICD-10-CM | POA: Diagnosis not present

## 2019-01-21 DIAGNOSIS — I10 Essential (primary) hypertension: Secondary | ICD-10-CM | POA: Diagnosis not present

## 2019-01-21 DIAGNOSIS — I5022 Chronic systolic (congestive) heart failure: Secondary | ICD-10-CM

## 2019-01-21 DIAGNOSIS — L910 Hypertrophic scar: Secondary | ICD-10-CM

## 2019-01-21 LAB — POCT GLYCOSYLATED HEMOGLOBIN (HGB A1C): Hemoglobin A1C: 7.3 % — AB (ref 4.0–5.6)

## 2019-01-21 LAB — GLUCOSE, CAPILLARY: Glucose-Capillary: 137 mg/dL — ABNORMAL HIGH (ref 70–99)

## 2019-01-21 NOTE — Assessment & Plan Note (Signed)
Follows with heart failure clinic. Had dyspnea earlier this year but currently improved after modification to her pacer. On digoxin, furosemide, amiodarone, weekly metolazone. Weight 185lbs at baseline. Appear euvolemic on exam.  - C/w current regimen - F/u with cards

## 2019-01-21 NOTE — Patient Instructions (Signed)
Dear [name],  Thank you for allowing [place] to provide your care today. Today we discussed your diabetes    I have ordered bmp labs for you. I will call if any are abnormal.    Today we made the following changes to your medications:    Please stop stiagliptin Please start semaglutide 7m daily for 30 days and then 765mdaily  Please start metformin 50075mnd titrate up as instructed I have also made a referral for you to see dermatology.  Please follow-up in 3 months with your PCP.    Should you have any questions or concerns please call the internal medicine clinic at [phone]  Thank you for choosing [facility].  Metformin tablets What is this medicine? METFORMIN (met FOR min) is used to treat type 2 diabetes. It helps to control blood sugar. Treatment is combined with diet and exercise. This medicine can be used alone or with other medicines for diabetes. This medicine may be used for other purposes; ask your health care provider or pharmacist if you have questions. COMMON BRAND NAME(S): Glucophage What should I tell my health care provider before I take this medicine? They need to know if you have any of these conditions:  anemia  dehydration  heart disease  frequently drink alcohol-containing beverages  kidney disease  liver disease  polycystic ovary syndrome  serious infection or injury  vomiting  an unusual or allergic reaction to metformin, other medicines, foods, dyes, or preservatives  pregnant or trying to get pregnant  breast-feeding How should I use this medicine? Take this medicine by mouth with a glass of water. Follow the directions on the prescription label. Take this medicine with food. Take your medicine at regular intervals. Do not take your medicine more often than directed. Do not stop taking except on your doctor's advice. Talk to your pediatrician regarding the use of this medicine in children. While this drug may be  prescribed for children as young as 10 86ars of age for selected conditions, precautions do apply. Overdosage: If you think you have taken too much of this medicine contact a poison control center or emergency room at once. NOTE: This medicine is only for you. Do not share this medicine with others. What if I miss a dose? If you miss a dose, take it as soon as you can. If it is almost time for your next dose, take only that dose. Do not take double or extra doses. What may interact with this medicine? Do not take this medicine with any of the following medications:  certain contrast medicines given before X-rays, CT scans, MRI, or other procedures  dofetilide This medicine may also interact with the following medications:  acetazolamide  alcohol  certain antivirals for HIV or hepatitis  certain medicines for blood pressure, heart disease, irregular heart beat  cimetidine  dichlorphenamide  digoxin  diuretics  female hormones, like estrogens or progestins and birth control pills  glycopyrrolate  isoniazid  lamotrigine  memantine  methazolamide  metoclopramide  midodrine  niacin  phenothiazines like chlorpromazine, mesoridazine, prochlorperazine, thioridazine  phenytoin  ranolazine  steroid medicines like prednisone or cortisone  stimulant medicines for attention disorders, weight loss, or to stay awake  thyroid medicines  topiramate  trospium  vandetanib  zonisamide This list may not describe all possible interactions. Give your health care provider a list of all the medicines, herbs, non-prescription drugs, or dietary supplements you use. Also tell them if you smoke, drink alcohol,  or use illegal drugs. Some items may interact with your medicine. What should I watch for while using this medicine? Visit your doctor or health care professional for regular checks on your progress. A test called the HbA1C (A1C) will be monitored. This is a simple blood  test. It measures your blood sugar control over the last 2 to 3 months. You will receive this test every 3 to 6 months. Learn how to check your blood sugar. Learn the symptoms of low and high blood sugar and how to manage them. Always carry a quick-source of sugar with you in case you have symptoms of low blood sugar. Examples include hard sugar candy or glucose tablets. Make sure others know that you can choke if you eat or drink when you develop serious symptoms of low blood sugar, such as seizures or unconsciousness. They must get medical help at once. Tell your doctor or health care professional if you have high blood sugar. You might need to change the dose of your medicine. If you are sick or exercising more than usual, you might need to change the dose of your medicine. Do not skip meals. Ask your doctor or health care professional if you should avoid alcohol. Many nonprescription cough and cold products contain sugar or alcohol. These can affect blood sugar. This medicine may cause ovulation in premenopausal women who do not have regular monthly periods. This may increase your chances of becoming pregnant. You should not take this medicine if you become pregnant or think you may be pregnant. Talk with your doctor or health care professional about your birth control options while taking this medicine. Contact your doctor or health care professional right away if you think you are pregnant. If you are going to need surgery, a MRI, CT scan, or other procedure, tell your doctor that you are taking this medicine. You may need to stop taking this medicine before the procedure. Wear a medical ID bracelet or chain, and carry a card that describes your disease and details of your medicine and dosage times. This medicine may cause a decrease in folic acid and vitamin B12. You should make sure that you get enough vitamins while you are taking this medicine. Discuss the foods you eat and the vitamins you take  with your health care professional. What side effects may I notice from receiving this medicine? Side effects that you should report to your doctor or health care professional as soon as possible:  allergic reactions like skin rash, itching or hives, swelling of the face, lips, or tongue  breathing problems  feeling faint or lightheaded, falls  muscle aches or pains  signs and symptoms of low blood sugar such as feeling anxious, confusion, dizziness, increased hunger, unusually weak or tired, sweating, shakiness, cold, irritable, headache, blurred vision, fast heartbeat, loss of consciousness  slow or irregular heartbeat  unusual stomach pain or discomfort  unusually tired or weak Side effects that usually do not require medical attention (report to your doctor or health care professional if they continue or are bothersome):  diarrhea  headache  heartburn  metallic taste in mouth  nausea  stomach gas, upset This list may not describe all possible side effects. Call your doctor for medical advice about side effects. You may report side effects to FDA at 1-800-FDA-1088. Where should I keep my medicine? Keep out of the reach of children. Store at room temperature between 15 and 30 degrees C (59 and 86 degrees F). Protect from moisture and  light. Throw away any unused medicine after the expiration date. NOTE: This sheet is a summary. It may not cover all possible information. If you have questions about this medicine, talk to your doctor, pharmacist, or health care provider.  2020 Elsevier/Gold Standard (2017-03-26 19:15:19)

## 2019-01-21 NOTE — Assessment & Plan Note (Signed)
Presents w/ complaints of groin pain. On exam, tender keloid scar at site of previous catherizations. Mentions pain with pressure. Discussed avoiding tight-fitting clothing. Encouraged [name] to discuss with Dr.Benshimon regarding alternate sites for cath. Will refer to dermatology for management.  - Referral to dermatology

## 2019-01-21 NOTE — Assessment & Plan Note (Addendum)
Lab Results  Component Value Date   HGBA1C 7.3 (A) 01/21/2019   Well controlled on sitagliptin and canagliflozin. Mentions that she has been on Lantus until early this year and she is now on these oral medications. Mentions previous hx of being on metformin which was discontinued due to CKD. Discussed cardiovascular benefits of GLP-1 vs DPP4 inhibitor and recommend changing sitagliptin to semaglutide. [name] expressed understanding.  - C/w canaglifloxin 100mg  daily - Stop sitagliptin - Start semaglutide 3mg  daily - F/u in 3 months - Will get bmp to check renal fx. If worsening hemoglobin a1c, can start metformin if GFR > 45

## 2019-01-21 NOTE — Assessment & Plan Note (Signed)
BP Readings from Last 3 Encounters:  01/21/19 116/66  10/12/18 106/62  08/18/18 118/78   Bp at goal. Currently on losartan, daily lasix, weekly metolazone. Denies any light-headedness or blurry vision.  - C/w losartan 25mg , furosemide 80mg  - BMP today

## 2019-01-21 NOTE — Progress Notes (Signed)
CC: Groin pain  HPI: [name] is a 63 y.o. 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 She states that she has had multiple catherizations for her cardiac disease and her interventional cardiologist used the same femoral site 3 times. A keloid scar has developed over the insertion site and now she is having pain with pruritus on her groin, exacerbated by pressure. She mentions that the pain is bearable but she wants the keloid to 'go away.' Denies any bleeding, exudates, fevers, chills, dysuria.  Past Medical History:  Diagnosis Date  . AICD (automatic cardioverter/defibrillator) present   . Chronic systolic heart failure ([place])    a. ICM b. ECHO (06/2013): EF 15%, severe MR (06/2013) c. RHC (10/2013): RA 5, RV 23/2/5, PA 25/6 (14), PCWP 12, Fick CO/CI: 3.2/1.7, PVR 0.7 WU, PA 45%, 47% and 55% (with levophed 5), vagal response during cath. d. On home milrinone.  . CKD (chronic kidney disease), stage III   . Coronary artery disease    a. s/p CABG x 5 with MV annuloplasty 2007   . Diabetes mellitus   . DVT (deep venous thrombosis) ([place]) 2009  . Hypertension   . Implantable defibrillator   medtronic   . Ischemic cardiomyopathy    a. s/p ICD. b. LHC (10/04/13): 1. Severe native CAD with all bypass grafts patent. c. CRT upgrade 12/2013.  [name] LBBB (left bundle branch block)   . Lipoma   . PAD (peripheral artery disease) ([place])   . PICC line infection    a. 01/2014 - PICC exchanged.  . Polysubstance abuse ([place])    history of  (cocaine, tobacco and ETOH)  . Presence of permanent cardiac pacemaker   . V-tach [facility])    Review of Systems: Review of Systems  Constitutional: Negative for chills, fever and malaise/fatigue.  Respiratory: Negative for shortness of breath.   Cardiovascular: Negative for chest pain, palpitations and leg swelling.   Gastrointestinal: Negative for constipation, diarrhea, nausea and vomiting.  Genitourinary: Negative for dysuria, frequency and urgency.     Physical Exam: Vitals:   01/21/19 0924  BP: 116/66  Pulse: 65  Temp: 97.8 F (36.6 C)  TempSrc: Oral  SpO2: 99%  Weight: 186 lb 9.6 oz (84.6 kg)  Height: 5\' 4"  (1.626 m)    Physical Exam  Constitutional: She is oriented to person, place, and time. She appears well-developed and well-nourished. No distress.  HENT:  Mouth/Throat: Oropharynx is clear and moist.  Eyes: Conjunctivae are normal.  Neck: Normal range of motion. Neck supple. No JVD present.  Cardiovascular: Normal rate, regular rhythm, normal heart sounds and intact distal pulses.  No murmur heard. Respiratory: Effort normal and breath sounds normal. She has no wheezes. She has no rales.  GI: Soft. Bowel sounds are normal. She exhibits no distension. There is no abdominal tenderness.  Musculoskeletal: Normal range of motion.        General: No edema.  Neurological: She is alert and oriented to person, place, and time.  Skin: Skin is warm and dry.  Keloid scar over R cath site with tenderness to palpation without erythema, edema, or drainage. Pictured below        Assessment & Plan:   Diabetes mellitus type 2, controlled ([place]) Lab Results  Component Value Date   HGBA1C 7.3 (A) 01/21/2019   Well controlled on sitagliptin and canagliflozin. Mentions that she  has been on Lantus until early this year and she is now on these oral medications. Mentions previous hx of being on metformin which was discontinued due to CKD. Discussed cardiovascular benefits of GLP-1 vs DPP4 inhibitor and recommend changing sitagliptin to semaglutide. [name] expressed understanding.  - C/w canaglifloxin 100mg  daily - Stop sitagliptin - Start semaglutide 3mg  daily - F/u in 3 months - Will get bmp to check renal fx. If worsening hemoglobin a1c, can start metformin if GFR > 45  Hypertension  associated with diabetes ([place]) BP Readings from Last 3 Encounters:  01/21/19 116/66  10/12/18 106/62  08/18/18 118/78   Bp at goal. Currently on losartan, daily lasix, weekly metolazone. Denies any light-headedness or blurry vision.  - C/w losartan 25mg , furosemide 80mg  - BMP today   Chronic systolic heart failure ([place]) Follows with heart failure clinic. Had dyspnea earlier this year but currently improved after modification to her pacer. On digoxin, furosemide, amiodarone, weekly metolazone. Weight 185lbs at baseline. Appear euvolemic on exam.  - C/w current regimen - F/u with cards  Keloid of skin Presents w/ complaints of groin pain. On exam, tender keloid scar at site of previous catherizations. Mentions pain with pressure. Discussed avoiding tight-fitting clothing. Encouraged [name] to discuss with Dr.Benshimon regarding alternate sites for cath. Will refer to dermatology for management.  - Referral to dermatology    Patient discussed with Dr. Rebeca Alert   -Gilberto Better, PGY2 [facility] Pager: [phone]

## 2019-01-22 LAB — BMP8+ANION GAP
Anion Gap: 18 mmol/L (ref 10.0–18.0)
BUN/Creatinine Ratio: 24 (ref 12–28)
BUN: 38 mg/dL — ABNORMAL HIGH (ref 8–27)
CO2: 21 mmol/L (ref 20–29)
Calcium: 10.2 mg/dL (ref 8.7–10.3)
Chloride: 99 mmol/L (ref 96–106)
Creatinine, Ser: 1.57 mg/dL — ABNORMAL HIGH (ref 0.57–1.00)
GFR calc Af Amer: 41 mL/min/{1.73_m2} — ABNORMAL LOW (ref >59)
GFR calc non Af Amer: 35 mL/min/{1.73_m2} — ABNORMAL LOW (ref >59)
Glucose: 132 mg/dL — ABNORMAL HIGH (ref 65–99)
Potassium: 4 mmol/L (ref 3.5–5.2)
Sodium: 138 mmol/L (ref 134–144)

## 2019-01-22 MED ORDER — RYBELSUS 7 MG PO TABS: 1.0000 | ORAL_TABLET | Freq: Every day | ORAL | 2 refills | Status: DC

## 2019-01-22 MED ORDER — RYBELSUS 3 MG PO TABS: 1.0000 | ORAL_TABLET | Freq: Every day | ORAL | 0 refills | Status: AC

## 2019-01-22 NOTE — Addendum Note (Signed)
Addended by: Mosetta Anis on: 01/22/2019 01:12 PM   Modules accepted: Orders

## 2019-01-24 ENCOUNTER — Telehealth: Payer: Self-pay | Admitting: Internal Medicine

## 2019-01-24 NOTE — Telephone Encounter (Signed)
Discussed with [name] regarding her lab result including her stable chronic kidney disease with GFR ~40. Discussed holding off on starting metformin at this time. Advised on increasing her semaglutide dose to 7mg  after 1 month on 3mg . [name] expressed understanding.

## 2019-01-24 NOTE — Progress Notes (Signed)
Internal Medicine Clinic Attending  Case discussed with Dr. Lee at the time of the visit.  We reviewed the resident's history and exam and pertinent patient test results.  I agree with the assessment, diagnosis, and plan of care documented in the resident's note.  Alexander Raines, M.D., Ph.D.  

## 2019-01-26 ENCOUNTER — Other Ambulatory Visit (HOSPITAL_COMMUNITY): Payer: Self-pay | Admitting: Internal Medicine

## 2019-02-04 ENCOUNTER — Telehealth (HOSPITAL_COMMUNITY): Payer: Self-pay

## 2019-02-04 DIAGNOSIS — I739 Peripheral vascular disease, unspecified: Secondary | ICD-10-CM

## 2019-02-04 NOTE — Telephone Encounter (Signed)
Pt called reporting that for 3 weeks each morning after waking up she has a lot of pain in her bilateral thighs.  She states it takes a few minutes to go away.  Initially it is difficult to stand and walk however will subside moments later.  She does not experience this pain during the day, its mainly after waking up in the morning however its been going on for 3 weeks. She denies swelling and discoloration.  Her legs are normal temperature. Pt notes having history of blood clot years ago.  She is diabetic however blood sugar is no higher than 160.  She is due to follow up in 3 weeks.  Please advise.

## 2019-02-04 NOTE — Telephone Encounter (Signed)
Advised patient she requires lower extremity ultrasound.  She will get a call to schedule. Advised if symptoms worsen, she should report to ED for evaluation. Verbalized understanding.

## 2019-02-04 NOTE — Telephone Encounter (Signed)
Please set up for arterial brachial index. Diagnosis: PAD.   Please call.   Zoua Caporaso NP-C  11:32 AM

## 2019-02-04 NOTE — Addendum Note (Signed)
Addended by: Valeda Malm on: 02/04/2019 12:00 PM   Modules accepted: Orders

## 2019-02-08 ENCOUNTER — Telehealth (HOSPITAL_COMMUNITY): Payer: Self-pay

## 2019-02-08 ENCOUNTER — Other Ambulatory Visit (HOSPITAL_COMMUNITY): Payer: Self-pay | Admitting: Family Medicine

## 2019-02-08 ENCOUNTER — Other Ambulatory Visit (HOSPITAL_COMMUNITY): Payer: Self-pay | Admitting: Adult Health

## 2019-02-08 DIAGNOSIS — I739 Peripheral vascular disease, unspecified: Secondary | ICD-10-CM

## 2019-02-08 NOTE — Telephone Encounter (Signed)
Yes please. Amox 2 grams x 1

## 2019-02-08 NOTE — Telephone Encounter (Signed)
Pt called requesting abx for dental cleaning and possible filling. She was told to call office each time she goes to dentist. Please advise on abx and dose for dental cleaning and filling.

## 2019-02-09 ENCOUNTER — Ambulatory Visit (HOSPITAL_COMMUNITY)
Admission: RE | Admit: 2019-02-09 | Discharge: 2019-02-09 | Disposition: A | Discharge status: Home / Self Care | Payer: Medicare Other | Source: Ambulatory Visit | Attending: Cardiology | Admitting: Cardiology

## 2019-02-09 ENCOUNTER — Other Ambulatory Visit: Payer: Self-pay

## 2019-02-09 DIAGNOSIS — I739 Peripheral vascular disease, unspecified: Secondary | ICD-10-CM | POA: Insufficient documentation

## 2019-02-09 MED ORDER — AMOXICILLIN 500 MG PO TABS: 2000.0000 mg | ORAL_TABLET | Freq: Once | ORAL | 0 refills | Status: AC

## 2019-02-09 NOTE — Telephone Encounter (Signed)
Pt aware medicine sent to pharmacy

## 2019-02-10 ENCOUNTER — Other Ambulatory Visit: Payer: Self-pay | Admitting: *Deleted

## 2019-02-10 NOTE — Telephone Encounter (Signed)
Next appt scheduled 03/15/19 with PCP.

## 2019-02-11 MED ORDER — ACCU-CHEK SMARTVIEW VI STRP: ORAL_STRIP | 2 refills | Status: DC

## 2019-02-22 ENCOUNTER — Encounter (HOSPITAL_COMMUNITY): Payer: Self-pay | Admitting: Internal Medicine

## 2019-02-22 ENCOUNTER — Encounter: Payer: Self-pay | Admitting: *Deleted

## 2019-02-22 ENCOUNTER — Other Ambulatory Visit: Payer: Self-pay

## 2019-02-22 ENCOUNTER — Ambulatory Visit (HOSPITAL_COMMUNITY)
Admission: RE | Admit: 2019-02-22 | Discharge: 2019-02-22 | Disposition: A | Discharge status: Home / Self Care | Payer: Medicare Other | Source: Ambulatory Visit | Attending: Internal Medicine | Admitting: Internal Medicine

## 2019-02-22 VITALS — BP 110/63 | HR 63 | Wt 187.8 lb

## 2019-02-22 DIAGNOSIS — K635 Polyp of colon: Secondary | ICD-10-CM | POA: Diagnosis not present

## 2019-02-22 DIAGNOSIS — Z951 Presence of aortocoronary bypass graft: Secondary | ICD-10-CM | POA: Diagnosis not present

## 2019-02-22 DIAGNOSIS — Z794 Long term (current) use of insulin: Secondary | ICD-10-CM | POA: Diagnosis not present

## 2019-02-22 DIAGNOSIS — I255 Ischemic cardiomyopathy: Secondary | ICD-10-CM | POA: Diagnosis not present

## 2019-02-22 DIAGNOSIS — I509 Heart failure, unspecified: Secondary | ICD-10-CM | POA: Diagnosis not present

## 2019-02-22 DIAGNOSIS — I5022 Chronic systolic (congestive) heart failure: Secondary | ICD-10-CM | POA: Diagnosis not present

## 2019-02-22 DIAGNOSIS — Z7982 Long term (current) use of aspirin: Secondary | ICD-10-CM | POA: Insufficient documentation

## 2019-02-22 DIAGNOSIS — F1021 Alcohol dependence, in remission: Secondary | ICD-10-CM | POA: Insufficient documentation

## 2019-02-22 DIAGNOSIS — Z79890 Hormone replacement therapy (postmenopausal): Secondary | ICD-10-CM | POA: Insufficient documentation

## 2019-02-22 DIAGNOSIS — Z79899 Other long term (current) drug therapy: Secondary | ICD-10-CM | POA: Insufficient documentation

## 2019-02-22 DIAGNOSIS — I472 Ventricular tachycardia: Secondary | ICD-10-CM | POA: Insufficient documentation

## 2019-02-22 DIAGNOSIS — E039 Hypothyroidism, unspecified: Secondary | ICD-10-CM | POA: Insufficient documentation

## 2019-02-22 DIAGNOSIS — E1151 Type 2 diabetes mellitus with diabetic peripheral angiopathy without gangrene: Secondary | ICD-10-CM | POA: Diagnosis not present

## 2019-02-22 DIAGNOSIS — Z86718 Personal history of other venous thrombosis and embolism: Secondary | ICD-10-CM | POA: Diagnosis not present

## 2019-02-22 DIAGNOSIS — Z95810 Presence of automatic (implantable) cardiac defibrillator: Secondary | ICD-10-CM | POA: Insufficient documentation

## 2019-02-22 DIAGNOSIS — F1411 Cocaine abuse, in remission: Secondary | ICD-10-CM | POA: Insufficient documentation

## 2019-02-22 DIAGNOSIS — Z8249 Family history of ischemic heart disease and other diseases of the circulatory system: Secondary | ICD-10-CM | POA: Diagnosis not present

## 2019-02-22 DIAGNOSIS — E1122 Type 2 diabetes mellitus with diabetic chronic kidney disease: Secondary | ICD-10-CM | POA: Diagnosis not present

## 2019-02-22 DIAGNOSIS — I2510 Atherosclerotic heart disease of native coronary artery without angina pectoris: Secondary | ICD-10-CM | POA: Diagnosis not present

## 2019-02-22 DIAGNOSIS — I447 Left bundle-branch block, unspecified: Secondary | ICD-10-CM | POA: Insufficient documentation

## 2019-02-22 DIAGNOSIS — I5030 Unspecified diastolic (congestive) heart failure: Secondary | ICD-10-CM | POA: Diagnosis not present

## 2019-02-22 DIAGNOSIS — E669 Obesity, unspecified: Secondary | ICD-10-CM | POA: Insufficient documentation

## 2019-02-22 DIAGNOSIS — I130 Hypertensive heart and chronic kidney disease with heart failure and stage 1 through stage 4 chronic kidney disease, or unspecified chronic kidney disease: Secondary | ICD-10-CM | POA: Diagnosis present

## 2019-02-22 DIAGNOSIS — I340 Nonrheumatic mitral (valve) insufficiency: Secondary | ICD-10-CM | POA: Diagnosis not present

## 2019-02-22 DIAGNOSIS — Z6832 Body mass index (BMI) 32.0-32.9, adult: Secondary | ICD-10-CM | POA: Diagnosis not present

## 2019-02-22 DIAGNOSIS — N183 Chronic kidney disease, stage 3 unspecified: Secondary | ICD-10-CM | POA: Diagnosis not present

## 2019-02-22 DIAGNOSIS — I252 Old myocardial infarction: Secondary | ICD-10-CM | POA: Insufficient documentation

## 2019-02-22 DIAGNOSIS — I25810 Atherosclerosis of coronary artery bypass graft(s) without angina pectoris: Secondary | ICD-10-CM | POA: Insufficient documentation

## 2019-02-22 DIAGNOSIS — Z833 Family history of diabetes mellitus: Secondary | ICD-10-CM | POA: Insufficient documentation

## 2019-02-22 LAB — COMPREHENSIVE METABOLIC PANEL
ALT: 24 U/L (ref 0–44)
AST: 23 U/L (ref 15–41)
Albumin: 4.2 g/dL (ref 3.5–5.0)
Alkaline Phosphatase: 80 U/L (ref 38–126)
Anion gap: 12 (ref 5–15)
BUN: 36 mg/dL — ABNORMAL HIGH (ref 8–23)
CO2: 28 mmol/L (ref 22–32)
Calcium: 9.6 mg/dL (ref 8.9–10.3)
Chloride: 96 mmol/L — ABNORMAL LOW (ref 98–111)
Creatinine, Ser: 1.85 mg/dL — ABNORMAL HIGH (ref 0.44–1.00)
GFR calc Af Amer: 33 mL/min — ABNORMAL LOW (ref >60)
GFR calc non Af Amer: 29 mL/min — ABNORMAL LOW (ref >60)
Glucose, Bld: 258 mg/dL — ABNORMAL HIGH (ref 70–99)
Potassium: 3.5 mmol/L (ref 3.5–5.1)
Sodium: 136 mmol/L (ref 135–145)
Total Bilirubin: 0.5 mg/dL (ref 0.3–1.2)
Total Protein: 7.9 g/dL (ref 6.5–8.1)

## 2019-02-22 LAB — CK: Total CK: 134 U/L (ref 38–234)

## 2019-02-22 LAB — HM DIABETES EYE EXAM

## 2019-02-22 LAB — BRAIN NATRIURETIC PEPTIDE: B Natriuretic Peptide: 218.2 pg/mL — ABNORMAL HIGH (ref 0.0–100.0)

## 2019-02-22 NOTE — Progress Notes (Signed)
Patient ID: Gwenevere Abbot, female   DOB: Aug 05, 1955, 63 y.o.   MRN: [id number]    Advanced Heart Failure Clinic Note  PCP: Dr. Posey Pronto  Primary HF:  Dr. Haroldine Laws  EP: Dr Beaulah Dinning Transplant : Dr Radene Knee   HPI: Deshay Kirstein is a 63 y.o. female with history of HTN, DM2, past polysubstance abuse (ETOH, tobacco, cocaine), CAD s/p CABG x5 with MV annuloplasty (2007), ischemic cardiomyopathy s/p Medtronic CRT-D, chronic systolic HF EF 62-83%, CKD stage III and PAD. Blood type O+.  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 At time of cath LAD, LCX and RCA occluded. EF 45%. Had PCI of LAD followed by abrupt stent occlusion. Had repeat PCI of LAD and then underwent CABG x 5 with mitral valve annuloplasty in 2007.   Amitted 8/2-10/07/13 for syncope s/p ICD shock. Concern that VT was possibly from ischemia and taken for [facility]. Started on Amiodarone. Cardiac output dropped while in hospital so started on milrinone. Discharged home. Underwent CRT-D upgrade on 12/02/13  In 12/15, she was admitted with catheter infection. She was admitted and catheter was replaced. Milrinone titrated off in January 2016 and has done reasonably well.   Currently undergoing heart-kidney transplant eval with Dr. Radene Knee at [facility]   Underwent Generator change and lead revision 11/13/17. Goal was for HIS bundle pacing, but ended up with septal pacing. Was feeling bad after and saw Dr. Caryl Comes on 12/11/17.  Now s/p lead revision. HIS lead was capped and LV lead was incorporated into system.   She presents today for follow up. Said she is doing pretty good. Takes lasix 80 every day and metolazone once on the weekends. Says hamstrings get tight when she gets out of bed at night. No claudication. No CP. No ICD firings. Saw Dr. Radene Knee last week and was doing well. Labs on 02/15/19 K 3.9 Cr 1.86   ICD interrogation: Thoracic impedence up and down but staying below threshold.  Increases slightly every week but then goes down with metolazone. 100% v-pacing  No VT/AF Activity level 3-4 hours/day Personally reviewed     [facility] [address] 03/10/2017  Ao = 99/59 (75) LV = 104/22 RA = 11 RV = 57/13 PA = 61/28 (39) PCW = 24 (v = 35-40) Fick cardiac output/index = 5.5/2.9 Thermo CO/CI = 6.9/3.7 PVR = 2.2 WU FA sat = 96% PA sat = 65%, 64% Assessment: 1. Severe 3v CAD 2. All 5 grafts open 3. Moderately elevated biventricular pressures with normal cardiac output. Prominent v-waves in PCW tracing c/w with significant MR  Echos: 05/19/2012  EF 15% 2/15 EF 15%, s/p MV repair with moderate-severe MR.  4/15: EF 15%, severe MR, mod TR 2/17 EF 20-25% 9/18 Echo EF 20-25% RV normal Mod MR   CPX (2/20): peak VO2 15.3 (80 % predicted) VE/VCO2 slope 42  RER 1.10 CPX (4/14): peak VO2 14.8 (68.5% predicted) VE/VCO2 slope 43  RER 1.12 CPX (3/15): peak VO2 15.6 (74.5% predicted) VE/VCO2 slope 41, RER 1.2 CPX (6/16): peak VO2:13.3 (68.6% predicted) VE/VCO2 slope:34  RER 1.12 CPX (6/17): peak VO2 13.3 (71.0% predicted) VE/VCO2 slope 41  RER 1.19   FH: Mother deceased: CAD, DM2, HTN        Father deceased: stroke  SH: Works odds/end jobs for [employer]; disabled. Lives in [place] with 2 sons([name] and [name]).   Review of systems complete and found to be negative unless listed in HPI.    Past  Medical History:  Diagnosis Date  . AICD (automatic cardioverter/defibrillator) present   . Chronic systolic heart failure ([place])    a. ICM b. ECHO (06/2013): EF 15%, severe MR (06/2013) c. RHC (10/2013): RA 5, RV 23/2/5, PA 25/6 (14), PCWP 12, Fick CO/CI: 3.2/1.7, PVR 0.7 WU, PA 45%, 47% and 55% (with levophed 5), vagal response during cath. d. On home milrinone.  . CKD (chronic kidney disease), stage III   . Coronary artery disease    a. s/p CABG x 5 with MV annuloplasty 2007   . Diabetes mellitus   . DVT (deep venous thrombosis) ([place]) 2009  . Hypertension   . Implantable defibrillator    medtronic   . Ischemic cardiomyopathy    a. s/p ICD. b. LHC (10/04/13): 1. Severe native CAD with all bypass grafts patent. c. CRT upgrade 12/2013.  [name] LBBB (left bundle branch block)   . Lipoma   . PAD (peripheral artery disease) ([place])   . PICC line infection    a. 01/2014 - PICC exchanged.  . Polysubstance abuse ([place])    history of  (cocaine, tobacco and ETOH)  . Presence of permanent cardiac pacemaker   . V-tach [facility])     Current Outpatient Medications  Medication Sig Dispense Refill  . ACCU-CHEK FASTCLIX LANCETS MISC Use to test blood glucose 3 times daily. Dx:E11.9 102 each 5  . acetaminophen (TYLENOL) 500 MG tablet Take 1,000 mg by mouth every 6 (six) hours as needed for moderate pain.    [name] allopurinol (ZYLOPRIM) 100 MG tablet TAKE 2 TABLETS BY MOUTH AT BEDTIME 60 tablet 5  . amiodarone (PACERONE) 200 MG tablet Take 1/2 (one-half) tablet by mouth once daily 15 tablet 0  . aspirin EC 81 MG tablet Take 81 mg by mouth every morning.     [name] atorvastatin (LIPITOR) 80 MG tablet Take 1 tablet by mouth once daily 90 tablet 0  . Blood Glucose Monitoring Suppl (ACCU-CHEK NANO SMARTVIEW) W/DEVICE KIT Use to test blood glucose 2 times daily. Dx:E11.9 1 kit 0  . canagliflozin (INVOKANA) 100 MG TABS tablet Take 1 tablet (100 mg total) by mouth daily before breakfast. 90 tablet 1  . Cholecalciferol (VITAMIN D3) 125 MCG (5000 UT) CAPS Take 5,000 Units by mouth daily.    . digoxin (LANOXIN) 0.125 MG tablet TAKE 0.0625 MG TABLET BY MOUTH EVERY OTHER DAY 15 tablet 3  . docusate sodium (COLACE) 100 MG capsule Take 100 mg by mouth daily as needed for mild constipation.    . furosemide (LASIX) 80 MG tablet Take 1 tablet (80 mg total) by mouth daily. May also take 0.5 tablets (40 mg total) at bedtime as needed. 45 tablet 3  . glucose blood (ACCU-CHEK SMARTVIEW) test strip Use to test blood glucose 4 times daily (before meals and at bedtime). Dx:E11.9. INSULIN DEPENDENT 100 each 2  . Insulin Pen Needle 32G X  4 MM MISC Use to inject insulin 1 time a day 100 each 3  . levothyroxine (SYNTHROID) 25 MCG tablet Take 1 tablet (25 mcg total) by mouth daily before breakfast. 90 tablet 3  . losartan (COZAAR) 25 MG tablet Take 1/2 (one-half) tablet by mouth twice daily 60 tablet 0  . Melatonin 1 MG TABS Take 2 mg by mouth at bedtime as needed (sleep).    . metolazone (ZAROXOLYN) 2.5 MG tablet Take 1 tablet (2.5 mg total) by mouth once a week. May take an additional tablet ONLY as directed by the CHF clinic. 15 tablet 3  .  neomycin-bacitracin-polymyxin (NEOSPORIN) OINT Apply 1 application topically 2 (two) times daily. 1 Tube 0  . nitroGLYCERIN (NITROSTAT) 0.4 MG SL tablet Place 1 tablet (0.4 mg total) under the tongue every 5 (five) minutes as needed. For chest pain. 25 tablet 11  . potassium chloride SA (K-DUR,KLOR-CON) 20 MEQ tablet Take 1 tablet (20 mEq total) by mouth 2 (two) times daily. 90 tablet 11  . Semaglutide (RYBELSUS) 7 MG TABS Take 1 tablet by mouth daily. 30 tablet 2   No current facility-administered medications for this encounter.   Vitals:   02/22/19 1340  Weight: 85.2 kg (187 lb 12.8 oz)     Wt Readings from Last 3 Encounters:  02/22/19 85.2 kg (187 lb 12.8 oz)  01/21/19 84.6 kg (186 lb 9.6 oz)  10/12/18 85.5 kg (188 lb 6.4 oz)     PHYSICAL EXAM: General:  Well appearing. No resp difficulty HEENT: normal Neck: supple. JVP 6-7. Carotids 2+ bilat; no bruits. No lymphadenopathy or thryomegaly appreciated. Cor: PMI nondisplaced. Regular rate & rhythm. 2/6  Lungs: clear Abdomen: soft, nontender, nondistended. No hepatosplenomegaly. No bruits or masses. Good bowel sounds. Extremities: no cyanosis, clubbing, rash, edema Neuro: alert & orientedx3, cranial nerves grossly intact. moves all 4 extremities w/o difficulty. Affect pleasant   ASSESSMENT & PLAN:  1. Chronic systolic CHF: Ischemic cardiomyopathy s/p Medtronic CRT-D, EF 20-25% ( Echo 04/2015). - Had LV lead revision in 9/19 and  unable to place lead in CS so received HIS lead placement.  - Echo 03/25/17 LVEF 20-25% (outside hospital/[facility]) with moderate MR. RV ok- Underwent lead revision 02/03/18 with cessation of HIS lead pacing and LV lead  - She follows in the transplant clinic at [facility] q 6 months with Dr. Radene Knee. She is blood type O - Doing well.  Stable NYHA II  Volume status looks good on exam and Optivol (up and down but metolazone keeps her level) occasionally as needed  - CPX 2/20 stable - Continue lasix 80 mg daily and metolazone every Friday.  Advised she can take an extra dose of metolazone - Intolerant entresto and carvedilol due to hypotension. Tried multiple times.  - Continue losartan 12.5 mg daily.  - [name] stopped with worsening renal function.  - Continue digoxin 0.0625 mg daily. - Recent labs from [facility] reviewed creatinine   2. CAD:  - Stable on cath 1/19 - No s/s ichemia - Continue ASA/statin  3. Mitral regurgitation:  - S/p mitral valve annuloplasty with CABG in 2007. Moderate to severe MR on echo in 4/15. TEE (06/2013) mitral regurgitation appeared severe, anatomy of repaired valve does not look suitable for MV clipping and would be high risk for MV replacement.  - Moderate by Echo 03/2017.  - No change.   4. CKD stage III:  - Renal US - no hydronephrosis,. - BMET last week stable - Follows with [facility]  5.  NSVT:  - Continue amiodarone 100 mg.daily  - TSH elelvated on 5/13. Started thyroid medications.  - No VT noted on ICD interrogation   6. Obesity - Body mass index is 32.24 kg/m.   7. PAD:  - Symptoms stable.   - Right mid SFA stenosis per [place] 08/30/14.    8. Colon polyps - Had colonoscopy 06/08/14 with 12 polyps.  - Per PCP.   9. Hypothyroidism - TSH elevated 07/13/2017. PCP started synthroid. - Following with Endo.  - No change   Glori Bickers, MD  1:41 PM

## 2019-02-22 NOTE — Patient Instructions (Signed)
Labs done today, we will notify you of abnormal readings  Your physician recommends that you schedule a follow-up appointment in: 3 months with echocardiogram  If you have any questions or concerns before your next appointment please send [place] a message through [place] or call our office at [phone].  At the [facility], you and your health needs are our priority. As part of our continuing mission to provide you with exceptional heart care, we have created designated Provider Care Teams. These Care Teams include your primary Cardiologist (physician) and Advanced Practice Providers (APPs- Physician Assistants and Nurse Practitioners) who all work together to provide you with the care you need, when you need it.   You may see any of the following providers on your designated Care Team at your next follow up: [name] Dr Glori Bickers . Dr Loralie Champagne . Darrick Grinder, NP . Lyda Jester, PA . Audry Riles, PharmD   Please be sure to bring in all your medications bottles to every appointment.

## 2019-02-23 ENCOUNTER — Other Ambulatory Visit: Payer: Self-pay | Admitting: Internal Medicine

## 2019-02-23 ENCOUNTER — Other Ambulatory Visit (HOSPITAL_COMMUNITY): Payer: Self-pay | Admitting: Internal Medicine

## 2019-02-23 DIAGNOSIS — E118 Type 2 diabetes mellitus with unspecified complications: Secondary | ICD-10-CM

## 2019-03-09 ENCOUNTER — Other Ambulatory Visit (HOSPITAL_COMMUNITY): Payer: Self-pay | Admitting: Internal Medicine

## 2019-03-09 DIAGNOSIS — I5022 Chronic systolic (congestive) heart failure: Secondary | ICD-10-CM

## 2019-03-10 ENCOUNTER — Other Ambulatory Visit: Payer: Self-pay | Admitting: *Deleted

## 2019-03-10 DIAGNOSIS — I5022 Chronic systolic (congestive) heart failure: Secondary | ICD-10-CM

## 2019-03-10 MED ORDER — DIGOXIN 125 MCG PO TABS: ORAL_TABLET | ORAL | 3 refills | Status: DC

## 2019-03-10 NOTE — Telephone Encounter (Signed)
She has an ACC appt on 03/15/19.

## 2019-03-15 ENCOUNTER — Telehealth: Payer: Self-pay | Admitting: *Deleted

## 2019-03-15 ENCOUNTER — Other Ambulatory Visit: Payer: Self-pay

## 2019-03-15 ENCOUNTER — Encounter: Payer: Self-pay | Admitting: Internal Medicine

## 2019-03-15 ENCOUNTER — Ambulatory Visit (INDEPENDENT_AMBULATORY_CARE_PROVIDER_SITE_OTHER): Payer: Medicare Other | Admitting: Internal Medicine

## 2019-03-15 VITALS — BP 108/71 | HR 63 | Temp 98.0°F | Ht 64.0 in | Wt 186.1 lb

## 2019-03-15 DIAGNOSIS — E032 Hypothyroidism due to medicaments and other exogenous substances: Secondary | ICD-10-CM | POA: Diagnosis not present

## 2019-03-15 DIAGNOSIS — E118 Type 2 diabetes mellitus with unspecified complications: Secondary | ICD-10-CM

## 2019-03-15 DIAGNOSIS — E119 Type 2 diabetes mellitus without complications: Secondary | ICD-10-CM | POA: Diagnosis not present

## 2019-03-15 DIAGNOSIS — Z87891 Personal history of nicotine dependence: Secondary | ICD-10-CM

## 2019-03-15 DIAGNOSIS — Z7984 Long term (current) use of oral hypoglycemic drugs: Secondary | ICD-10-CM

## 2019-03-15 DIAGNOSIS — Z7982 Long term (current) use of aspirin: Secondary | ICD-10-CM

## 2019-03-15 DIAGNOSIS — Z79890 Hormone replacement therapy (postmenopausal): Secondary | ICD-10-CM

## 2019-03-15 DIAGNOSIS — T462X1A Poisoning by other antidysrhythmic drugs, accidental (unintentional), initial encounter: Secondary | ICD-10-CM

## 2019-03-15 MED ORDER — ACCU-CHEK SMARTVIEW VI STRP: ORAL_STRIP | 2 refills | Status: DC

## 2019-03-15 NOTE — Progress Notes (Signed)
   CC: DM, hypothyroidism  HPI:  [name] is a 64 y.o. female with PMHx listed below presenting for DM, hypothyroidism. Please see the A&P for the status of the patient's chronic medical problems.  Past Medical History:  Diagnosis Date  . AICD (automatic cardioverter/defibrillator) present   . Chronic systolic heart failure ([place])    a. ICM b. ECHO (06/2013): EF 15%, severe MR (06/2013) c. RHC (10/2013): RA 5, RV 23/2/5, PA 25/6 (14), PCWP 12, Fick CO/CI: 3.2/1.7, PVR 0.7 WU, PA 45%, 47% and 55% (with levophed 5), vagal response during cath. d. On home milrinone.  . CKD (chronic kidney disease), stage III   . Coronary artery disease    a. s/p CABG x 5 with MV annuloplasty 2007   . Diabetes mellitus   . DVT (deep venous thrombosis) ([place]) 2009  . Hypertension   . Implantable defibrillator   medtronic   . Ischemic cardiomyopathy    a. s/p ICD. b. LHC (10/04/13): 1. Severe native CAD with all bypass grafts patent. c. CRT upgrade 12/2013.  [name] LBBB (left bundle branch block)   . Lipoma   . PAD (peripheral artery disease) ([place])   . PICC line infection    a. 01/2014 - PICC exchanged.  . Polysubstance abuse ([place])    history of  (cocaine, tobacco and ETOH)  . Presence of permanent cardiac pacemaker   . V-tach [facility])    Review of Systems:  Performed and all others negative.  Physical Exam: Vitals:   03/15/19 0906  BP: 108/71  Pulse: 63  Temp: 98 F (36.7 C)  TempSrc: Oral  SpO2: 96%  Weight: 186 lb 1.6 oz (84.4 kg)  Height: 5\' 4"  (1.626 m)   General: Obese female in no acute distress Pulm: Good air movement with no wheezing or crackles  CV: RRR, no murmurs, no rubs   Assessment & Plan:   See Encounters Tab for problem based charting.  Patient discussed with Dr. Lynnae January

## 2019-03-15 NOTE — Patient Instructions (Signed)
Thank you for allowing [place] to provide your care. Today we're not making any changes to her medication. Continue to work on diet and exercise for further control of your diabetes. Next time you come in we will recheck your A1c. I have sent out test strips to your pharmacy, if they did not receive it please call us back.  Please come back to see Dr. Darrick Meigs in three months or sooner if any issues arise.

## 2019-03-15 NOTE — Telephone Encounter (Signed)
Received fax from [facility] to "Provide more documentation or change directions to TID" on accu-chek test strips.  I called [facility] - stated Medicare will only pay for qty # 100 test strips (which was ordered) and TID testing (QID was ordered) Stated if QID testing is needed, the doctor needs to send more documentation as to why this needed.  Please send rx with TID testing if applicable. Thanks

## 2019-03-15 NOTE — Assessment & Plan Note (Signed)
Patient with well-controlled type II diabetes. She is currently on semaglutide 7 mg once daily and canagliflozin 100 mg once daily. She has been taking both these medications as prescribed and has not missed any doses. She checks her blood sugar twice daily. Her fasting blood sugar ranges anywhere from 80 to 160 depending on what she ate the night before. She has had no further hypoglycemia.  Review of records indicates her last hemoglobin A1c was two months prior at 7.3.  A/P: - Continue semaglutide 7 mg once daily and canagliflozin 100 mg once daily - Refill test strips  - Continue losartan and atorvastatin - Up-to-date on foot exam and eye exam.

## 2019-03-15 NOTE — Telephone Encounter (Signed)
New prescription sent. Thank you

## 2019-03-15 NOTE — Assessment & Plan Note (Signed)
Patient with known hypothyroidism on levothyroxine 25 mcg per day. She takes this in the morning on an empty stomach with water. Last TSH on 10/23 was within normal limits. She denies signs or symptoms of hypo/hyperthyroidism  A/P: - Well controlled  - Continue Levothyroxine 25 mcg QD

## 2019-03-16 NOTE — Progress Notes (Signed)
Internal Medicine Clinic Attending  Case discussed with Dr. Helberg at the time of the visit.  We reviewed the resident's history and exam and pertinent patient test results.  I agree with the assessment, diagnosis, and plan of care documented in the resident's note.    

## 2019-03-23 ENCOUNTER — Other Ambulatory Visit (HOSPITAL_COMMUNITY): Payer: Self-pay | Admitting: Internal Medicine

## 2019-03-24 ENCOUNTER — Telehealth: Payer: Self-pay

## 2019-03-24 ENCOUNTER — Other Ambulatory Visit (HOSPITAL_COMMUNITY): Payer: Self-pay | Admitting: *Deleted

## 2019-03-24 ENCOUNTER — Ambulatory Visit (INDEPENDENT_AMBULATORY_CARE_PROVIDER_SITE_OTHER): Payer: Medicare Other | Admitting: *Deleted

## 2019-03-24 DIAGNOSIS — I447 Left bundle-branch block, unspecified: Secondary | ICD-10-CM | POA: Diagnosis not present

## 2019-03-24 DIAGNOSIS — I5022 Chronic systolic (congestive) heart failure: Secondary | ICD-10-CM

## 2019-03-24 LAB — CUP PACEART REMOTE DEVICE CHECK
Battery Remaining Longevity: 28 mo
Battery Voltage: 2.94 V
Brady Statistic AP VP Percent: 7 %
Brady Statistic AP VS Percent: 0.17 %
Brady Statistic AS VP Percent: 90.55 %
Brady Statistic AS VS Percent: 2.27 %
Brady Statistic RA Percent Paced: 7.01 %
Brady Statistic RV Percent Paced: 92.98 %
Date Time Interrogation Session: 20210121033523
HighPow Impedance: 40 Ohm
HighPow Impedance: 49 Ohm
Implantable Lead Implant Date: 20111012
Implantable Lead Implant Date: 20151002
Implantable Lead Implant Date: 20151002
Implantable Lead Location: 753858
Implantable Lead Location: 753859
Implantable Lead Location: 753860
Implantable Lead Model: 4396
Implantable Lead Model: 5076
Implantable Lead Model: 6947
Implantable Pulse Generator Implant Date: 20190913
Lead Channel Impedance Value: 399 Ohm
Lead Channel Impedance Value: 456 Ohm
Lead Channel Impedance Value: 456 Ohm
Lead Channel Impedance Value: 456 Ohm
Lead Channel Impedance Value: 513 Ohm
Lead Channel Impedance Value: 779 Ohm
Lead Channel Pacing Threshold Amplitude: 1.125 V
Lead Channel Pacing Threshold Amplitude: 1.375 V
Lead Channel Pacing Threshold Amplitude: 2.75 V
Lead Channel Pacing Threshold Pulse Width: 0.4 ms
Lead Channel Pacing Threshold Pulse Width: 0.4 ms
Lead Channel Pacing Threshold Pulse Width: 1.5 ms
Lead Channel Sensing Intrinsic Amplitude: 0.375 mV
Lead Channel Sensing Intrinsic Amplitude: 0.375 mV
Lead Channel Sensing Intrinsic Amplitude: 22.125 mV
Lead Channel Sensing Intrinsic Amplitude: 22.125 mV
Lead Channel Setting Pacing Amplitude: 2 V
Lead Channel Setting Pacing Amplitude: 2.75 V
Lead Channel Setting Pacing Amplitude: 3.5 V
Lead Channel Setting Pacing Pulse Width: 0.4 ms
Lead Channel Setting Pacing Pulse Width: 1.5 ms
Lead Channel Setting Sensing Sensitivity: 0.45 mV

## 2019-03-24 MED ORDER — DIGOXIN 125 MCG PO TABS: ORAL_TABLET | ORAL | 3 refills | Status: DC

## 2019-03-24 MED ORDER — AMIODARONE HCL 200 MG PO TABS: ORAL_TABLET | ORAL | 3 refills | Status: DC

## 2019-03-24 MED ORDER — LOSARTAN POTASSIUM 25 MG PO TABS: ORAL_TABLET | ORAL | 3 refills | Status: DC

## 2019-03-24 NOTE — Telephone Encounter (Signed)
Scheduled transmission for CRT-D received,  Increase in AT/ AF burden to 2.1% of the time.  Episodes beginning approx 1/15 and currently ongoing. AF is very fine, undersensing noted. Also not, decreased BiV pacing in correlation with AF onset.   Spoke with pt, she has not had a change in condition associated with above noted episode, however over the past 2-3 weeks she has noticed some increased SOB, decreased exercise tolerance and weakness.  Pt is not on [place].  Currently taking Amiodarone 100mg  daily for history of VT. Advised will send to MD for review/ recommendations, pt in agreement with plan.

## 2019-03-24 NOTE — Telephone Encounter (Signed)
A  could you get her to AFib clinic to start on anticoagulation  Thanks SK

## 2019-03-25 NOTE — Telephone Encounter (Signed)
Called and spoke with pt who declined appt for today & was agreeable to appt on 03/28/19.  She is aware of time and location of appt for Monday with [facility]Fib [facility].

## 2019-03-25 NOTE — Telephone Encounter (Signed)
Spoke with pt, advised of Dr. Olin Pia recommendation for AFIB clinic consult.  Pt agreeable, states now that she is aware of irregular HR she feels it more and would like appt as soon as possible,

## 2019-03-28 ENCOUNTER — Encounter (HOSPITAL_COMMUNITY): Payer: Self-pay | Admitting: Nurse Practitioner

## 2019-03-28 ENCOUNTER — Ambulatory Visit (HOSPITAL_COMMUNITY)
Admission: RE | Admit: 2019-03-28 | Discharge: 2019-03-28 | Disposition: A | Discharge status: Home / Self Care | Payer: Medicare Other | Source: Ambulatory Visit | Attending: Nurse Practitioner | Admitting: Nurse Practitioner

## 2019-03-28 ENCOUNTER — Other Ambulatory Visit: Payer: Self-pay

## 2019-03-28 VITALS — BP 112/74 | HR 118 | Ht 64.0 in | Wt 182.4 lb

## 2019-03-28 DIAGNOSIS — Z87891 Personal history of nicotine dependence: Secondary | ICD-10-CM | POA: Insufficient documentation

## 2019-03-28 DIAGNOSIS — I4819 Other persistent atrial fibrillation: Secondary | ICD-10-CM | POA: Insufficient documentation

## 2019-03-28 DIAGNOSIS — Z7901 Long term (current) use of anticoagulants: Secondary | ICD-10-CM | POA: Insufficient documentation

## 2019-03-28 DIAGNOSIS — I447 Left bundle-branch block, unspecified: Secondary | ICD-10-CM | POA: Diagnosis not present

## 2019-03-28 DIAGNOSIS — I4891 Unspecified atrial fibrillation: Secondary | ICD-10-CM | POA: Diagnosis not present

## 2019-03-28 DIAGNOSIS — I4892 Unspecified atrial flutter: Secondary | ICD-10-CM | POA: Diagnosis not present

## 2019-03-28 DIAGNOSIS — Z79899 Other long term (current) drug therapy: Secondary | ICD-10-CM | POA: Diagnosis not present

## 2019-03-28 DIAGNOSIS — I130 Hypertensive heart and chronic kidney disease with heart failure and stage 1 through stage 4 chronic kidney disease, or unspecified chronic kidney disease: Secondary | ICD-10-CM | POA: Insufficient documentation

## 2019-03-28 DIAGNOSIS — I739 Peripheral vascular disease, unspecified: Secondary | ICD-10-CM | POA: Diagnosis not present

## 2019-03-28 DIAGNOSIS — R5383 Other fatigue: Secondary | ICD-10-CM | POA: Diagnosis not present

## 2019-03-28 DIAGNOSIS — Z6740 Type O blood, Rh positive: Secondary | ICD-10-CM | POA: Diagnosis not present

## 2019-03-28 DIAGNOSIS — E118 Type 2 diabetes mellitus with unspecified complications: Secondary | ICD-10-CM | POA: Insufficient documentation

## 2019-03-28 DIAGNOSIS — Z794 Long term (current) use of insulin: Secondary | ICD-10-CM | POA: Diagnosis not present

## 2019-03-28 DIAGNOSIS — Z951 Presence of aortocoronary bypass graft: Secondary | ICD-10-CM | POA: Diagnosis not present

## 2019-03-28 DIAGNOSIS — I2510 Atherosclerotic heart disease of native coronary artery without angina pectoris: Secondary | ICD-10-CM | POA: Diagnosis not present

## 2019-03-28 DIAGNOSIS — I5030 Unspecified diastolic (congestive) heart failure: Secondary | ICD-10-CM | POA: Diagnosis not present

## 2019-03-28 DIAGNOSIS — I255 Ischemic cardiomyopathy: Secondary | ICD-10-CM | POA: Insufficient documentation

## 2019-03-28 DIAGNOSIS — R0602 Shortness of breath: Secondary | ICD-10-CM | POA: Insufficient documentation

## 2019-03-28 DIAGNOSIS — I5022 Chronic systolic (congestive) heart failure: Secondary | ICD-10-CM | POA: Diagnosis not present

## 2019-03-28 DIAGNOSIS — Z86718 Personal history of other venous thrombosis and embolism: Secondary | ICD-10-CM | POA: Diagnosis not present

## 2019-03-28 DIAGNOSIS — N183 Chronic kidney disease, stage 3 unspecified: Secondary | ICD-10-CM | POA: Insufficient documentation

## 2019-03-28 LAB — COMPREHENSIVE METABOLIC PANEL
ALT: 25 U/L (ref 0–44)
AST: 20 U/L (ref 15–41)
Albumin: 4.2 g/dL (ref 3.5–5.0)
Alkaline Phosphatase: 70 U/L (ref 38–126)
Anion gap: 16 — ABNORMAL HIGH (ref 5–15)
BUN: 31 mg/dL — ABNORMAL HIGH (ref 8–23)
CO2: 27 mmol/L (ref 22–32)
Calcium: 9.8 mg/dL (ref 8.9–10.3)
Chloride: 94 mmol/L — ABNORMAL LOW (ref 98–111)
Creatinine, Ser: 1.9 mg/dL — ABNORMAL HIGH (ref 0.44–1.00)
GFR calc Af Amer: 32 mL/min — ABNORMAL LOW (ref >60)
GFR calc non Af Amer: 28 mL/min — ABNORMAL LOW (ref >60)
Glucose, Bld: 136 mg/dL — ABNORMAL HIGH (ref 70–99)
Potassium: 3.7 mmol/L (ref 3.5–5.1)
Sodium: 137 mmol/L (ref 135–145)
Total Bilirubin: 1 mg/dL (ref 0.3–1.2)
Total Protein: 7.8 g/dL (ref 6.5–8.1)

## 2019-03-28 LAB — CBC
HCT: 42.5 % (ref 36.0–46.0)
Hemoglobin: 13.3 g/dL (ref 12.0–15.0)
MCH: 28.9 pg (ref 26.0–34.0)
MCHC: 31.3 g/dL (ref 30.0–36.0)
MCV: 92.2 fL (ref 80.0–100.0)
Platelets: 203 10*3/uL (ref 150–400)
RBC: 4.61 MIL/uL (ref 3.87–5.11)
RDW: 15.9 % — ABNORMAL HIGH (ref 11.5–15.5)
WBC: 6.1 10*3/uL (ref 4.0–10.5)
nRBC: 0 % (ref 0.0–0.2)

## 2019-03-28 LAB — TSH: TSH: 5.52 u[IU]/mL — ABNORMAL HIGH (ref 0.350–4.500)

## 2019-03-28 MED ORDER — AMIODARONE HCL 200 MG PO TABS: 200.0000 mg | ORAL_TABLET | Freq: Two times a day (BID) | ORAL | 3 refills | Status: DC

## 2019-03-28 MED ORDER — APIXABAN 5 MG PO TABS: 5.0000 mg | ORAL_TABLET | Freq: Two times a day (BID) | ORAL | 3 refills | Status: AC

## 2019-03-28 MED ORDER — AMIODARONE HCL 200 MG PO TABS: ORAL_TABLET | ORAL | 0 refills | Status: DC

## 2019-03-28 NOTE — H&P (View-Only) (Signed)
Primary Care Physician: Mitzi Hansen, MD Referring Physician: Dr. Caryl Comes Cardiologist: Dr. Haroldine Laws  EP: Dr. Theodis Aguas [name] is a 64 y.o. female with a h/o HTN, DM2, past polysubstance abuse (ETOH, tobacco, cocaine), CAD s/p CABG x5 with MV annuloplasty (2007), ischemic cardiomyopathy s/p Medtronic CRT-D, chronic systolic HF EF 37-62%, CKD stage III and PAD. Blood type O+.  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 At time of cath LAD, LCX and RCA occluded. EF 45%. Had PCI of LAD followed by abrupt stent occlusion. Had repeat PCI of LAD and then underwent CABG x 5 with mitral valve annuloplasty in 2007.   Amitted 8/2-10/07/13 for syncope s/p ICD shock. Concern that VT was possibly from ischemia and taken for [facility]. Started on Amiodarone. Cardiac output dropped while in hospital so started on milrinone. Discharged home. Underwent CRT-D upgrade on 12/02/13  She is now referred to the afib clinic for new onset afib since 1/15 and is ongoing by ekg today with a v rate 118. She has noted increased shortness of breath, fatigue for a couple of weeks. Her weight is stable if not down a few lbs. She is on amiodarone 100 mg a day for h/o ventricular tach. She has not been able to tolerate BB's in the past after numerous tries as she developed hypotension. She  does have h/o of numerous polyps, has colonoscopies every 5 years, she also  has h/o liver disease,low  plt's but denies any bleeding issues. She has been on asa for CAD for years.  Today, she denies symptoms of palpitations, chest pain, shortness of breath, orthopnea, PND, lower extremity edema, dizziness, presyncope, syncope, or neurologic sequela. The patient is tolerating medications without difficulties and is otherwise without complaint today.   Past Medical History:  Diagnosis Date  . AICD (automatic cardioverter/defibrillator) present   . Chronic systolic heart failure ([place])    a. ICM b. ECHO (06/2013): EF 15%, severe MR (06/2013) c. RHC (10/2013): RA 5, RV 23/2/5, PA 25/6 (14), PCWP 12, Fick CO/CI: 3.2/1.7, PVR 0.7 WU, PA 45%, 47% and 55% (with levophed 5), vagal response during cath. d. On home milrinone.  . CKD (chronic kidney disease), stage III   . Coronary artery disease    a. s/p CABG x 5 with MV annuloplasty 2007   . Diabetes mellitus   . DVT (deep venous thrombosis) ([place]) 2009  . Hypertension   . Implantable defibrillator   medtronic   . Ischemic cardiomyopathy    a. s/p ICD. b. LHC (10/04/13): 1. Severe native CAD with all bypass grafts patent. c. CRT upgrade 12/2013.  [name] LBBB (left bundle branch block)   . Lipoma   . PAD (peripheral artery disease) ([place])   . PICC line infection    a. 01/2014 - PICC exchanged.  . Polysubstance abuse ([place])    history of  (cocaine, tobacco and ETOH)  . Presence of permanent cardiac pacemaker   . V-tach [facility])    Past Surgical History:  Procedure Laterality Date  . ANKLE FRACTURE SURGERY Right    plate inserted  . ANKLE SURGERY Right    plate removed  . BI-VENTRICULAR IMPLANTABLE CARDIOVERTER DEFIBRILLATOR UPGRADE  12/02/2013   [facility] [place] CRTD upgrade by Dr Caryl Comes  . BI-VENTRICULAR IMPLANTABLE CARDIOVERTER DEFIBRILLATOR UPGRADE N/A 12/02/2013   Procedure: BI-VENTRICULAR IMPLANTABLE CARDIOVERTER DEFIBRILLATOR UPGRADE;  Surgeon: Deboraha Sprang, MD;  Location: [facility] CATH LAB;  Service: Cardiovascular;  Laterality: N/A;  .  CARDIAC CATHETERIZATION    . CARDIAC DEFIBRILLATOR PLACEMENT     2011, Followed By Dr. Caryl Comes  . COLONOSCOPY WITH PROPOFOL N/A 06/08/2014   Procedure: COLONOSCOPY WITH PROPOFOL;  Surgeon: Wilford Corner, MD;  Location: [facility] ENDOSCOPY;  Service: Endoscopy;  Laterality: N/A;  . COLONOSCOPY WITH PROPOFOL N/A 06/17/2017   Procedure: COLONOSCOPY WITH PROPOFOL;  Surgeon: Wilford Corner, MD;  Location: [facility];  Service: Endoscopy;  Laterality: N/A;  . CORONARY ARTERY BYPASS GRAFT  10/2005  . ICD GENERATOR  CHANGEOUT N/A 11/13/2017   Procedure: ICD GENERATOR CHANGEOUT;  Surgeon: Deboraha Sprang, MD;  Location: [place] CV LAB;  Service: Cardiovascular;  Laterality: N/A;  . LEAD REVISION/REPAIR N/A 11/13/2017   Procedure: LEAD REVISION/REPAIR;  Surgeon: Deboraha Sprang, MD;  Location: [place] CV LAB;  Service: Cardiovascular;  Laterality: N/A;  . LEAD REVISION/REPAIR N/A 02/03/2018   Procedure: LEAD REVISION/REPAIR;  Surgeon: Deboraha Sprang, MD;  Location: [place] CV LAB;  Service: Cardiovascular;  Laterality: N/A;  . LEFT AND RIGHT HEART CATHETERIZATION WITH CORONARY/GRAFT ANGIOGRAM N/A 03/17/2011   Procedure: LEFT AND RIGHT HEART CATHETERIZATION WITH Beatrix Fetters;  Surgeon: Jolaine Artist, MD;  Location: [facility] CATH LAB;  Service: Cardiovascular;  Laterality: N/A;  . LEFT AND RIGHT HEART CATHETERIZATION WITH CORONARY/GRAFT ANGIOGRAM N/A 10/04/2013   Procedure: LEFT AND RIGHT HEART CATHETERIZATION WITH Beatrix Fetters;  Surgeon: Jolaine Artist, MD;  Location: [facility] CATH LAB;  Service: Cardiovascular;  Laterality: N/A;  . RIGHT HEART CATHETERIZATION N/A 05/23/2013   Procedure: RIGHT HEART CATH;  Surgeon: Jolaine Artist, MD;  Location: [facility] CATH LAB;  Service: Cardiovascular;  Laterality: N/A;  . RIGHT HEART CATHETERIZATION N/A 11/01/2013   Procedure: RIGHT HEART CATH;  Surgeon: Jolaine Artist, MD;  Location: [facility] CATH LAB;  Service: Cardiovascular;  Laterality: N/A;  . RIGHT HEART CATHETERIZATION N/A 11/25/2013   Procedure: RIGHT HEART CATH;  Surgeon: Jolaine Artist, MD;  Location: [facility] CATH LAB;  Service: Cardiovascular;  Laterality: N/A;  . RIGHT/LEFT HEART CATH AND CORONARY/GRAFT ANGIOGRAPHY N/A 03/10/2017   Procedure: RIGHT/LEFT HEART CATH AND CORONARY/GRAFT ANGIOGRAPHY;  Surgeon: Jolaine Artist, MD;  Location: [place] CV LAB;  Service: Cardiovascular;  Laterality: N/A;  . TEE WITHOUT CARDIOVERSION N/A 06/02/2013   Procedure: TRANSESOPHAGEAL ECHOCARDIOGRAM (TEE);   Surgeon: Larey Dresser, MD;  Location: [facility] ENDOSCOPY;  Service: Cardiovascular;  Laterality: N/A;    Current Outpatient Medications  Medication Sig Dispense Refill  . ACCU-CHEK FASTCLIX LANCETS MISC Use to test blood glucose 3 times daily. Dx:E11.9 102 each 5  . allopurinol (ZYLOPRIM) 100 MG tablet TAKE 2 TABLETS BY MOUTH AT BEDTIME 60 tablet 5  . amoxicillin (AMOXIL) 500 MG capsule For dental procedures    . atorvastatin (LIPITOR) 80 MG tablet Take 1 tablet by mouth once daily 90 tablet 0  . Blood Glucose Monitoring Suppl (ACCU-CHEK NANO SMARTVIEW) W/DEVICE KIT Use to test blood glucose 2 times daily. Dx:E11.9 1 kit 0  . canagliflozin (INVOKANA) 100 MG TABS tablet Take 1 tablet (100 mg total) by mouth daily before breakfast. 90 tablet 1  . Cholecalciferol (VITAMIN D3) 125 MCG (5000 UT) CAPS Take 5,000 Units by mouth daily.    . digoxin (LANOXIN) 0.125 MG tablet TAKE 0.0625 MG TABLET BY MOUTH EVERY OTHER DAY 45 tablet 3  . docusate sodium (COLACE) 100 MG capsule Take 100 mg by mouth daily as needed for mild constipation.    . furosemide (LASIX) 80 MG tablet Take 1 tablet (80 mg total)  by mouth daily. May also take 0.5 tablets (40 mg total) at bedtime as needed. 45 tablet 3  . glucose blood (ACCU-CHEK SMARTVIEW) test strip Use to test blood glucose 2-3 times daily. Dx:E11.9. INSULIN DEPENDENT 100 each 2  . Insulin Pen Needle 32G X 4 MM MISC Use to inject insulin 1 time a day 100 each 3  . levothyroxine (SYNTHROID) 25 MCG tablet Take 1 tablet (25 mcg total) by mouth daily before breakfast. 90 tablet 3  . losartan (COZAAR) 25 MG tablet Take 1/2 (one-half) tablet by mouth twice daily 45 tablet 3  . Melatonin 1 MG TABS Take 2 mg by mouth at bedtime as needed (sleep).    . metolazone (ZAROXOLYN) 2.5 MG tablet Take 1 tablet (2.5 mg total) by mouth once a week. May take an additional tablet ONLY as directed by the CHF clinic. 15 tablet 3  . nitroGLYCERIN (NITROSTAT) 0.4 MG SL tablet Place 1 tablet  (0.4 mg total) under the tongue every 5 (five) minutes as needed. For chest pain. 25 tablet 11  . potassium chloride SA (K-DUR,KLOR-CON) 20 MEQ tablet Take 1 tablet (20 mEq total) by mouth 2 (two) times daily. 90 tablet 11  . Semaglutide (RYBELSUS) 7 MG TABS Take 1 tablet by mouth daily. 30 tablet 2  . amiodarone (PACERONE) 200 MG tablet Take 1 tablet (200 mg total) by mouth 2 (two) times daily. 45 tablet 3  . apixaban (ELIQUIS) 5 MG TABS tablet Take 1 tablet (5 mg total) by mouth 2 (two) times daily. 60 tablet 3   No current facility-administered medications for this encounter.    Allergies  Allergen Reactions  . Other     Social History   Socioeconomic History  . Marital status: Widowed    Spouse name: Not on file  . Number of children: Not on file  . Years of education: Not on file  . Highest education level: Not on file  Occupational History  . Occupation: disable    Employer: UNEMPLOYED  Tobacco Use  . Smoking status: Former Smoker    Years: 32.00    Types: Cigarettes    Quit date: 03/03/2011    Years since quitting: 8.0  . Smokeless tobacco: Never Used  . Tobacco comment: light smoker  Substance and Sexual Activity  . Alcohol use: No    Alcohol/week: 0.0 standard drinks  . Drug use: No    Comment: Has history of cocaine use, denies recent  . Sexual activity: Not on file  Other Topics Concern  . Not on file  Social History Narrative  . Not on file   Social Determinants of Health   Financial Resource Strain:   . Difficulty of Paying Living Expenses: Not on file  Food Insecurity:   . Worried About Charity fundraiser in the Last Year: Not on file  . Ran Out of Food in the Last Year: Not on file  Transportation Needs:   . Lack of Transportation (Medical): Not on file  . Lack of Transportation (Non-Medical): Not on file  Physical Activity:   . Days of Exercise per Week: Not on file  . Minutes of Exercise per Session: Not on file  Stress:   . Feeling of  Stress : Not on file  Social Connections:   . Frequency of Communication with Friends and Family: Not on file  . Frequency of Social Gatherings with Friends and Family: Not on file  . Attends Religious Services: Not on file  . Active Member  of Clubs or Organizations: Not on file  . Attends Archivist Meetings: Not on file  . Marital Status: Not on file  Intimate Partner Violence:   . Fear of Current or Ex-Partner: Not on file  . Emotionally Abused: Not on file  . Physically Abused: Not on file  . Sexually Abused: Not on file    Family History  Problem Relation Age of Onset  . Heart attack Mother 56  . Heart attack Father   . CVA Father   . Diabetes Maternal Grandmother   . Heart attack Cousin   . Heart attack Maternal Uncle     ROS- All systems are reviewed and negative except as per the HPI above  Physical Exam: Vitals:   03/28/19 0900  BP: 112/74  Pulse: (!) 118  Weight: 82.7 kg  Height: '5\' 4"'$  (1.626 m)   Wt Readings from Last 3 Encounters:  03/28/19 82.7 kg  03/15/19 84.4 kg  02/22/19 85.2 kg    Labs: Lab Results  Component Value Date   NA 136 02/22/2019   K 3.5 02/22/2019   CL 96 (L) 02/22/2019   CO2 28 02/22/2019   GLUCOSE 258 (H) 02/22/2019   BUN 36 (H) 02/22/2019   CREATININE 1.85 (H) 02/22/2019   CALCIUM 9.6 02/22/2019   MG 2.2 04/10/2016   Lab Results  Component Value Date   INR 1.06 11/04/2017   Lab Results  Component Value Date   CHOL 223 (H) 08/30/2014   HDL 36.40 (L) 06/07/2013   LDLCALC 82 06/07/2013   TRIG 106.0 06/07/2013     GEN- The patient is well appearing, alert and oriented x 3 today.   Head- normocephalic, atraumatic Eyes-  Sclera clear, conjunctiva pink Ears- hearing intact Oropharynx- clear Neck- supple, no JVP Lymph- no cervical lymphadenopathy Lungs- Clear to ausculation bilaterally, normal work of breathing Heart- irregular rate and rhythm, no murmurs, rubs or gallops, PMI not laterally displaced GI-  soft, NT, ND, + BS Extremities- no clubbing, cyanosis, or edema MS- no significant deformity or atrophy Skin- no rash or lesion Psych- euthymic mood, full affect Neuro- strength and sensation are intact  EKG-afib with rvr at 118 bpm, NSIVB     Assessment and Plan: 1. New onset persistent afib  General education re afib Intolerant of  BB's in the past with hypotension  Discussed with Dr. Haroldine Laws Fear pt may get into trouble with her HF/redcued EF if allowed to continue in afib with RVR.  Will increase amiodarone to 200 mg bid and plan on TEE guided  cardioversion with Dr. Haroldine Laws this coming Friday Cbc/cmet/tsh/covid test today   2. CHA2DS2VASc  Score of 4 Denies bleeding history  Bleeding precautions discussed  Has some  baseline factors that may predispose her to bleeding  Will start eliquis 5 mg bid  Stop asa  F/u with afib clinic in one week after cardioversion Continue Amiodarone  200 mg bid until then and will probably reduce to 200 mg daily    Gwendlyn Hanback C. Anaija Wissink, [facility] [address] [place], [place] [address] [phone]

## 2019-03-28 NOTE — Patient Instructions (Signed)
Increase amiodarone to 200mg  twice a day (with food)  Start Eliquis 5mg  twice a day  Stop aspirin  Cardioversion scheduled for Friday, January 29th  - Arrive at the [facility] and go to admitting at [facility] not eat or drink anything after midnight the night prior to your procedure.  - Take all your morning medication (except diabetic medications) with a sip of water prior to arrival.  - You will not be able to drive home after your procedure.

## 2019-03-28 NOTE — Progress Notes (Signed)
Primary Care Physician: Mitzi Hansen, MD Referring Physician: Dr. Caryl Comes Cardiologist: Dr. Haroldine Laws  EP: Dr. Theodis Aguas [name] is a 64 y.o. female with a h/o HTN, DM2, past polysubstance abuse (ETOH, tobacco, cocaine), CAD s/p CABG x5 with MV annuloplasty (2007), ischemic cardiomyopathy s/p Medtronic CRT-D, chronic systolic HF EF 30-86%, CKD stage III and PAD. Blood type O+.  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 At time of cath LAD, LCX and RCA occluded. EF 45%. Had PCI of LAD followed by abrupt stent occlusion. Had repeat PCI of LAD and then underwent CABG x 5 with mitral valve annuloplasty in 2007.   Amitted 8/2-10/07/13 for syncope s/p ICD shock. Concern that VT was possibly from ischemia and taken for [facility]. Started on Amiodarone. Cardiac output dropped while in hospital so started on milrinone. Discharged home. Underwent CRT-D upgrade on 12/02/13  She is now referred to the afib clinic for new onset afib since 1/15 and is ongoing by ekg today with a v rate 118. She has noted increased shortness of breath, fatigue for a couple of weeks. Her weight is stable if not down a few lbs. She is on amiodarone 100 mg a day for h/o ventricular tach. She has not been able to tolerate BB's in the past after numerous tries as she developed hypotension. She  does have h/o of numerous polyps, has colonoscopies every 5 years, she also  has h/o liver disease,low  plt's but denies any bleeding issues. She has been on asa for CAD for years.  Today, she denies symptoms of palpitations, chest pain, shortness of breath, orthopnea, PND, lower extremity edema, dizziness, presyncope, syncope, or neurologic sequela. The patient is tolerating medications without difficulties and is otherwise without complaint today.   Past Medical History:  Diagnosis Date  . AICD (automatic cardioverter/defibrillator) present   . Chronic systolic heart failure ([place])    a. ICM b. ECHO (06/2013): EF 15%, severe MR (06/2013) c. RHC (10/2013): RA 5, RV 23/2/5, PA 25/6 (14), PCWP 12, Fick CO/CI: 3.2/1.7, PVR 0.7 WU, PA 45%, 47% and 55% (with levophed 5), vagal response during cath. d. On home milrinone.  . CKD (chronic kidney disease), stage III   . Coronary artery disease    a. s/p CABG x 5 with MV annuloplasty 2007   . Diabetes mellitus   . DVT (deep venous thrombosis) ([place]) 2009  . Hypertension   . Implantable defibrillator   medtronic   . Ischemic cardiomyopathy    a. s/p ICD. b. LHC (10/04/13): 1. Severe native CAD with all bypass grafts patent. c. CRT upgrade 12/2013.  [name] LBBB (left bundle branch block)   . Lipoma   . PAD (peripheral artery disease) ([place])   . PICC line infection    a. 01/2014 - PICC exchanged.  . Polysubstance abuse ([place])    history of  (cocaine, tobacco and ETOH)  . Presence of permanent cardiac pacemaker   . V-tach [facility])    Past Surgical History:  Procedure Laterality Date  . ANKLE FRACTURE SURGERY Right    plate inserted  . ANKLE SURGERY Right    plate removed  . BI-VENTRICULAR IMPLANTABLE CARDIOVERTER DEFIBRILLATOR UPGRADE  12/02/2013   [facility] [place] CRTD upgrade by Dr Caryl Comes  . BI-VENTRICULAR IMPLANTABLE CARDIOVERTER DEFIBRILLATOR UPGRADE N/A 12/02/2013   Procedure: BI-VENTRICULAR IMPLANTABLE CARDIOVERTER DEFIBRILLATOR UPGRADE;  Surgeon: Deboraha Sprang, MD;  Location: [facility] CATH LAB;  Service: Cardiovascular;  Laterality: N/A;  .  CARDIAC CATHETERIZATION    . CARDIAC DEFIBRILLATOR PLACEMENT     2011, Followed By Dr. Caryl Comes  . COLONOSCOPY WITH PROPOFOL N/A 06/08/2014   Procedure: COLONOSCOPY WITH PROPOFOL;  Surgeon: Wilford Corner, MD;  Location: [facility] ENDOSCOPY;  Service: Endoscopy;  Laterality: N/A;  . COLONOSCOPY WITH PROPOFOL N/A 06/17/2017   Procedure: COLONOSCOPY WITH PROPOFOL;  Surgeon: Wilford Corner, MD;  Location: [place];  Service: Endoscopy;  Laterality: N/A;  . CORONARY ARTERY BYPASS GRAFT  10/2005  . ICD GENERATOR  CHANGEOUT N/A 11/13/2017   Procedure: ICD GENERATOR CHANGEOUT;  Surgeon: Deboraha Sprang, MD;  Location: [place] CV LAB;  Service: Cardiovascular;  Laterality: N/A;  . LEAD REVISION/REPAIR N/A 11/13/2017   Procedure: LEAD REVISION/REPAIR;  Surgeon: Deboraha Sprang, MD;  Location: [address] CV LAB;  Service: Cardiovascular;  Laterality: N/A;  . LEAD REVISION/REPAIR N/A 02/03/2018   Procedure: LEAD REVISION/REPAIR;  Surgeon: Deboraha Sprang, MD;  Location: [place] CV LAB;  Service: Cardiovascular;  Laterality: N/A;  . LEFT AND RIGHT HEART CATHETERIZATION WITH CORONARY/GRAFT ANGIOGRAM N/A 03/17/2011   Procedure: LEFT AND RIGHT HEART CATHETERIZATION WITH Beatrix Fetters;  Surgeon: Jolaine Artist, MD;  Location: [facility] CATH LAB;  Service: Cardiovascular;  Laterality: N/A;  . LEFT AND RIGHT HEART CATHETERIZATION WITH CORONARY/GRAFT ANGIOGRAM N/A 10/04/2013   Procedure: LEFT AND RIGHT HEART CATHETERIZATION WITH Beatrix Fetters;  Surgeon: Jolaine Artist, MD;  Location: [facility] CATH LAB;  Service: Cardiovascular;  Laterality: N/A;  . RIGHT HEART CATHETERIZATION N/A 05/23/2013   Procedure: RIGHT HEART CATH;  Surgeon: Jolaine Artist, MD;  Location: [facility] CATH LAB;  Service: Cardiovascular;  Laterality: N/A;  . RIGHT HEART CATHETERIZATION N/A 11/01/2013   Procedure: RIGHT HEART CATH;  Surgeon: Jolaine Artist, MD;  Location: [facility] CATH LAB;  Service: Cardiovascular;  Laterality: N/A;  . RIGHT HEART CATHETERIZATION N/A 11/25/2013   Procedure: RIGHT HEART CATH;  Surgeon: Jolaine Artist, MD;  Location: [facility] CATH LAB;  Service: Cardiovascular;  Laterality: N/A;  . RIGHT/LEFT HEART CATH AND CORONARY/GRAFT ANGIOGRAPHY N/A 03/10/2017   Procedure: RIGHT/LEFT HEART CATH AND CORONARY/GRAFT ANGIOGRAPHY;  Surgeon: Jolaine Artist, MD;  Location: [place] CV LAB;  Service: Cardiovascular;  Laterality: N/A;  . TEE WITHOUT CARDIOVERSION N/A 06/02/2013   Procedure: TRANSESOPHAGEAL ECHOCARDIOGRAM (TEE);   Surgeon: Larey Dresser, MD;  Location: [facility] ENDOSCOPY;  Service: Cardiovascular;  Laterality: N/A;    Current Outpatient Medications  Medication Sig Dispense Refill  . ACCU-CHEK FASTCLIX LANCETS MISC Use to test blood glucose 3 times daily. Dx:E11.9 102 each 5  . allopurinol (ZYLOPRIM) 100 MG tablet TAKE 2 TABLETS BY MOUTH AT BEDTIME 60 tablet 5  . amoxicillin (AMOXIL) 500 MG capsule For dental procedures    . atorvastatin (LIPITOR) 80 MG tablet Take 1 tablet by mouth once daily 90 tablet 0  . Blood Glucose Monitoring Suppl (ACCU-CHEK NANO SMARTVIEW) W/DEVICE KIT Use to test blood glucose 2 times daily. Dx:E11.9 1 kit 0  . canagliflozin (INVOKANA) 100 MG TABS tablet Take 1 tablet (100 mg total) by mouth daily before breakfast. 90 tablet 1  . Cholecalciferol (VITAMIN D3) 125 MCG (5000 UT) CAPS Take 5,000 Units by mouth daily.    . digoxin (LANOXIN) 0.125 MG tablet TAKE 0.0625 MG TABLET BY MOUTH EVERY OTHER DAY 45 tablet 3  . docusate sodium (COLACE) 100 MG capsule Take 100 mg by mouth daily as needed for mild constipation.    . furosemide (LASIX) 80 MG tablet Take 1 tablet (80 mg total)  by mouth daily. May also take 0.5 tablets (40 mg total) at bedtime as needed. 45 tablet 3  . glucose blood (ACCU-CHEK SMARTVIEW) test strip Use to test blood glucose 2-3 times daily. Dx:E11.9. INSULIN DEPENDENT 100 each 2  . Insulin Pen Needle 32G X 4 MM MISC Use to inject insulin 1 time a day 100 each 3  . levothyroxine (SYNTHROID) 25 MCG tablet Take 1 tablet (25 mcg total) by mouth daily before breakfast. 90 tablet 3  . losartan (COZAAR) 25 MG tablet Take 1/2 (one-half) tablet by mouth twice daily 45 tablet 3  . Melatonin 1 MG TABS Take 2 mg by mouth at bedtime as needed (sleep).    . metolazone (ZAROXOLYN) 2.5 MG tablet Take 1 tablet (2.5 mg total) by mouth once a week. May take an additional tablet ONLY as directed by the CHF clinic. 15 tablet 3  . nitroGLYCERIN (NITROSTAT) 0.4 MG SL tablet Place 1 tablet  (0.4 mg total) under the tongue every 5 (five) minutes as needed. For chest pain. 25 tablet 11  . potassium chloride SA (K-DUR,KLOR-CON) 20 MEQ tablet Take 1 tablet (20 mEq total) by mouth 2 (two) times daily. 90 tablet 11  . Semaglutide (RYBELSUS) 7 MG TABS Take 1 tablet by mouth daily. 30 tablet 2  . amiodarone (PACERONE) 200 MG tablet Take 1 tablet (200 mg total) by mouth 2 (two) times daily. 45 tablet 3  . apixaban (ELIQUIS) 5 MG TABS tablet Take 1 tablet (5 mg total) by mouth 2 (two) times daily. 60 tablet 3   No current facility-administered medications for this encounter.    Allergies  Allergen Reactions  . Other     Social History   Socioeconomic History  . Marital status: Widowed    Spouse name: Not on file  . Number of children: Not on file  . Years of education: Not on file  . Highest education level: Not on file  Occupational History  . Occupation: disable    Employer: UNEMPLOYED  Tobacco Use  . Smoking status: Former Smoker    Years: 32.00    Types: Cigarettes    Quit date: 03/03/2011    Years since quitting: 8.0  . Smokeless tobacco: Never Used  . Tobacco comment: light smoker  Substance and Sexual Activity  . Alcohol use: No    Alcohol/week: 0.0 standard drinks  . Drug use: No    Comment: Has history of cocaine use, denies recent  . Sexual activity: Not on file  Other Topics Concern  . Not on file  Social History Narrative  . Not on file   Social Determinants of Health   Financial Resource Strain:   . Difficulty of Paying Living Expenses: Not on file  Food Insecurity:   . Worried About Charity fundraiser in the Last Year: Not on file  . Ran Out of Food in the Last Year: Not on file  Transportation Needs:   . Lack of Transportation (Medical): Not on file  . Lack of Transportation (Non-Medical): Not on file  Physical Activity:   . Days of Exercise per Week: Not on file  . Minutes of Exercise per Session: Not on file  Stress:   . Feeling of  Stress : Not on file  Social Connections:   . Frequency of Communication with Friends and Family: Not on file  . Frequency of Social Gatherings with Friends and Family: Not on file  . Attends Religious Services: Not on file  . Active Member  of Clubs or Organizations: Not on file  . Attends Archivist Meetings: Not on file  . Marital Status: Not on file  Intimate Partner Violence:   . Fear of Current or Ex-Partner: Not on file  . Emotionally Abused: Not on file  . Physically Abused: Not on file  . Sexually Abused: Not on file    Family History  Problem Relation Age of Onset  . Heart attack Mother 56  . Heart attack Father   . CVA Father   . Diabetes Maternal Grandmother   . Heart attack Cousin   . Heart attack Maternal Uncle     ROS- All systems are reviewed and negative except as per the HPI above  Physical Exam: Vitals:   03/28/19 0900  BP: 112/74  Pulse: (!) 118  Weight: 82.7 kg  Height: '5\' 4"'$  (1.626 m)   Wt Readings from Last 3 Encounters:  03/28/19 82.7 kg  03/15/19 84.4 kg  02/22/19 85.2 kg    Labs: Lab Results  Component Value Date   NA 136 02/22/2019   K 3.5 02/22/2019   CL 96 (L) 02/22/2019   CO2 28 02/22/2019   GLUCOSE 258 (H) 02/22/2019   BUN 36 (H) 02/22/2019   CREATININE 1.85 (H) 02/22/2019   CALCIUM 9.6 02/22/2019   MG 2.2 04/10/2016   Lab Results  Component Value Date   INR 1.06 11/04/2017   Lab Results  Component Value Date   CHOL 223 (H) 08/30/2014   HDL 36.40 (L) 06/07/2013   LDLCALC 82 06/07/2013   TRIG 106.0 06/07/2013     GEN- The patient is well appearing, alert and oriented x 3 today.   Head- normocephalic, atraumatic Eyes-  Sclera clear, conjunctiva pink Ears- hearing intact Oropharynx- clear Neck- supple, no JVP Lymph- no cervical lymphadenopathy Lungs- Clear to ausculation bilaterally, normal work of breathing Heart- irregular rate and rhythm, no murmurs, rubs or gallops, PMI not laterally displaced GI-  soft, NT, ND, + BS Extremities- no clubbing, cyanosis, or edema MS- no significant deformity or atrophy Skin- no rash or lesion Psych- euthymic mood, full affect Neuro- strength and sensation are intact  EKG-afib with rvr at 118 bpm, NSIVB     Assessment and Plan: 1. New onset persistent afib  General education re afib Intolerant of  BB's in the past with hypotension  Discussed with Dr. Haroldine Laws Fear pt may get into trouble with her HF/redcued EF if allowed to continue in afib with RVR.  Will increase amiodarone to 200 mg bid and plan on TEE guided  cardioversion with Dr. Haroldine Laws this coming Friday Cbc/cmet/tsh/covid test today   2. CHA2DS2VASc  Score of 4 Denies bleeding history  Bleeding precautions discussed  Has some  baseline factors that may predispose her to bleeding  Will start eliquis 5 mg bid  Stop asa  F/u with afib clinic in one week after cardioversion Continue Amiodarone  200 mg bid until then and will probably reduce to 200 mg daily    Devondre Guzzetta C. Chrstopher Malenfant, [facility] [address] [place], [place] [address] [phone]

## 2019-03-29 ENCOUNTER — Encounter (HOSPITAL_COMMUNITY): Payer: Self-pay

## 2019-03-29 ENCOUNTER — Other Ambulatory Visit (HOSPITAL_COMMUNITY)
Admission: RE | Admit: 2019-03-29 | Discharge: 2019-03-29 | Disposition: A | Discharge status: Home / Self Care | Payer: Medicare Other | Source: Ambulatory Visit | Attending: Internal Medicine | Admitting: Internal Medicine

## 2019-03-29 DIAGNOSIS — Z20822 Contact with and (suspected) exposure to covid-19: Secondary | ICD-10-CM | POA: Diagnosis not present

## 2019-03-29 DIAGNOSIS — Z01812 Encounter for preprocedural laboratory examination: Secondary | ICD-10-CM | POA: Diagnosis not present

## 2019-03-30 LAB — SARS CORONAVIRUS 2 (TAT 6-24 HRS): SARS Coronavirus 2: NEGATIVE

## 2019-03-31 NOTE — Progress Notes (Signed)
Pre procedure call done; reminded patient to be here 1 hr pre op with a ride. NPO after midnight and patient has been in quarantine since test.

## 2019-03-31 NOTE — Anesthesia Preprocedure Evaluation (Addendum)
Anesthesia Evaluation  Patient identified by MRN, date of birth, ID band Patient awake    Reviewed: Allergy & Precautions, NPO status , Patient's Chart, lab work & pertinent test results  Airway Mallampati: III  TM Distance: >3 FB Neck ROM: Full    Dental  (+) Chipped,    Pulmonary former smoker,    Pulmonary exam normal breath sounds clear to auscultation       Cardiovascular hypertension, Pt. on medications + CAD, + CABG, + Peripheral Vascular Disease, +CHF and + DVT  Normal cardiovascular exam+ dysrhythmias Atrial Fibrillation + pacemaker + Cardiac Defibrillator  Rhythm:Regular Rate:Normal  ECG: a-fib   Neuro/Psych negative neurological ROS  negative psych ROS   GI/Hepatic negative GI ROS, (+) Cirrhosis     substance abuse  ,   Endo/Other  diabetes, Insulin DependentHypothyroidism   Renal/GU Renal InsufficiencyRenal disease     Musculoskeletal Gout   Abdominal (+) + obese,   Peds  Hematology HLD   Anesthesia Other Findings A-FIB  Reproductive/Obstetrics                            Anesthesia Physical Anesthesia Plan  ASA: IV  Anesthesia Plan: General   Post-op Pain Management:    Induction: Intravenous  PONV Risk Score and Plan: 3 and Propofol infusion and Treatment may vary due to age or medical condition  Airway Management Planned: Nasal Cannula  Additional Equipment:   Intra-op Plan:   Post-operative Plan:   Informed Consent: I have reviewed the patients History and Physical, chart, labs and discussed the procedure including the risks, benefits and alternatives for the proposed anesthesia with the patient or authorized representative who has indicated his/her understanding and acceptance.     Dental advisory given  Plan Discussed with: CRNA  Anesthesia Plan Comments:        Anesthesia Quick Evaluation

## 2019-04-01 ENCOUNTER — Ambulatory Visit (HOSPITAL_COMMUNITY): Payer: Medicare Other | Admitting: Anesthesiology

## 2019-04-01 ENCOUNTER — Other Ambulatory Visit: Payer: Self-pay

## 2019-04-01 ENCOUNTER — Ambulatory Visit (HOSPITAL_BASED_OUTPATIENT_CLINIC_OR_DEPARTMENT_OTHER)
Admission: RE | Admit: 2019-04-01 | Discharge: 2019-04-01 | Disposition: A | Discharge status: Home / Self Care | Payer: Medicare Other | Source: Ambulatory Visit | Attending: Nurse Practitioner | Admitting: Nurse Practitioner

## 2019-04-01 ENCOUNTER — Encounter (HOSPITAL_COMMUNITY)
Admission: RE | Disposition: A | Discharge status: Home / Self Care | Payer: Self-pay | Source: Home / Self Care | Attending: Internal Medicine

## 2019-04-01 ENCOUNTER — Ambulatory Visit (HOSPITAL_COMMUNITY)
Admission: RE | Admit: 2019-04-01 | Discharge: 2019-04-01 | Disposition: A | Discharge status: Home / Self Care | Payer: Medicare Other | Attending: Internal Medicine | Admitting: Internal Medicine

## 2019-04-01 ENCOUNTER — Encounter (HOSPITAL_COMMUNITY): Payer: Self-pay | Admitting: Internal Medicine

## 2019-04-01 ENCOUNTER — Other Ambulatory Visit (HOSPITAL_COMMUNITY): Payer: Self-pay | Admitting: *Deleted

## 2019-04-01 DIAGNOSIS — N183 Chronic kidney disease, stage 3 unspecified: Secondary | ICD-10-CM | POA: Diagnosis not present

## 2019-04-01 DIAGNOSIS — I2510 Atherosclerotic heart disease of native coronary artery without angina pectoris: Secondary | ICD-10-CM | POA: Insufficient documentation

## 2019-04-01 DIAGNOSIS — I340 Nonrheumatic mitral (valve) insufficiency: Secondary | ICD-10-CM

## 2019-04-01 DIAGNOSIS — Z86718 Personal history of other venous thrombosis and embolism: Secondary | ICD-10-CM | POA: Insufficient documentation

## 2019-04-01 DIAGNOSIS — I255 Ischemic cardiomyopathy: Secondary | ICD-10-CM | POA: Diagnosis not present

## 2019-04-01 DIAGNOSIS — Z951 Presence of aortocoronary bypass graft: Secondary | ICD-10-CM | POA: Insufficient documentation

## 2019-04-01 DIAGNOSIS — Z87891 Personal history of nicotine dependence: Secondary | ICD-10-CM | POA: Insufficient documentation

## 2019-04-01 DIAGNOSIS — I4891 Unspecified atrial fibrillation: Secondary | ICD-10-CM

## 2019-04-01 DIAGNOSIS — E1151 Type 2 diabetes mellitus with diabetic peripheral angiopathy without gangrene: Secondary | ICD-10-CM | POA: Insufficient documentation

## 2019-04-01 DIAGNOSIS — E1122 Type 2 diabetes mellitus with diabetic chronic kidney disease: Secondary | ICD-10-CM | POA: Diagnosis not present

## 2019-04-01 DIAGNOSIS — Z79890 Hormone replacement therapy (postmenopausal): Secondary | ICD-10-CM | POA: Diagnosis not present

## 2019-04-01 DIAGNOSIS — Z79899 Other long term (current) drug therapy: Secondary | ICD-10-CM | POA: Insufficient documentation

## 2019-04-01 DIAGNOSIS — I130 Hypertensive heart and chronic kidney disease with heart failure and stage 1 through stage 4 chronic kidney disease, or unspecified chronic kidney disease: Secondary | ICD-10-CM | POA: Insufficient documentation

## 2019-04-01 DIAGNOSIS — I447 Left bundle-branch block, unspecified: Secondary | ICD-10-CM | POA: Insufficient documentation

## 2019-04-01 DIAGNOSIS — I4819 Other persistent atrial fibrillation: Secondary | ICD-10-CM | POA: Insufficient documentation

## 2019-04-01 DIAGNOSIS — I472 Ventricular tachycardia: Secondary | ICD-10-CM | POA: Diagnosis not present

## 2019-04-01 DIAGNOSIS — Z7901 Long term (current) use of anticoagulants: Secondary | ICD-10-CM | POA: Diagnosis not present

## 2019-04-01 DIAGNOSIS — Z95810 Presence of automatic (implantable) cardiac defibrillator: Secondary | ICD-10-CM | POA: Diagnosis not present

## 2019-04-01 DIAGNOSIS — Z794 Long term (current) use of insulin: Secondary | ICD-10-CM | POA: Diagnosis not present

## 2019-04-01 DIAGNOSIS — I5022 Chronic systolic (congestive) heart failure: Secondary | ICD-10-CM | POA: Insufficient documentation

## 2019-04-01 DIAGNOSIS — I361 Nonrheumatic tricuspid (valve) insufficiency: Secondary | ICD-10-CM | POA: Diagnosis not present

## 2019-04-01 DIAGNOSIS — I081 Rheumatic disorders of both mitral and tricuspid valves: Secondary | ICD-10-CM | POA: Diagnosis not present

## 2019-04-01 HISTORY — PX: CARDIOVERSION: SHX1299

## 2019-04-01 HISTORY — PX: TEE WITHOUT CARDIOVERSION: SHX5443

## 2019-04-01 LAB — GLUCOSE, CAPILLARY: Glucose-Capillary: 143 mg/dL — ABNORMAL HIGH (ref 70–99)

## 2019-04-01 SURGERY — ECHOCARDIOGRAM, TRANSESOPHAGEAL
Anesthesia: General

## 2019-04-01 MED ORDER — PROPOFOL 10 MG/ML IV BOLUS
INTRAVENOUS | Status: DC | PRN
  Administered 2019-04-01: 10 mg via INTRAVENOUS
  Administered 2019-04-01: 20 mg via INTRAVENOUS
  Administered 2019-04-01: 40 mg via INTRAVENOUS
  Administered 2019-04-01 (×2): 10 mg via INTRAVENOUS

## 2019-04-01 MED ORDER — PROPOFOL 500 MG/50ML IV EMUL
INTRAVENOUS | Status: DC | PRN
  Administered 2019-04-01: 50 ug/kg/min via INTRAVENOUS

## 2019-04-01 MED ORDER — SODIUM CHLORIDE 0.9 % IV SOLN: INTRAVENOUS | Status: DC | PRN

## 2019-04-01 MED ORDER — SODIUM CHLORIDE 0.9 % IV SOLN: INTRAVENOUS | Status: DC

## 2019-04-01 NOTE — CV Procedure (Addendum)
   TRANSESOPHAGEAL ECHOCARDIOGRAM GUIDED DIRECT CURRENT CARDIOVERSION  NAME:  [name]   MRN: [id number] DOB:  1955/04/24   ADMIT DATE: 04/01/2019  INDICATIONS:  Atrial fib  PROCEDURE:   Informed consent was obtained prior to the procedure. The risks, benefits and alternatives for the procedure were discussed and the patient comprehended these risks.  Risks include, but are not limited to, cough, sore throat, vomiting, nausea, somnolence, esophageal and stomach trauma or perforation, bleeding, low blood pressure, aspiration, pneumonia, infection, trauma to the teeth and death.    After a procedural time-out, the oropharynx was anesthetized and the patient was sedated by the anesthesia service. The transesophageal probe was inserted in the esophagus and stomach without difficulty and multiple views were obtained.   FINDINGS:  LEFT VENTRICLE: EF = Markedly dilated EF 20% with global HK and septal dyssynergy  RIGHT VENTRICLE: Moderately hypokinetic  LEFT ATRIUM: Markedly dilated. LA diameter 5.8 cm  LEFT ATRIAL APPENDAGE: No clot  RIGHT ATRIUM: Three pacer wires. Small to moderate sized mobile vegetation on RA lead  AORTIC VALVE:  Trileaflet. No AI/AS  MITRAL VALVE:    S/p MV ring. Posterior leaflet is fixed. Severe anterior MR  TRICUSPID VALVE: Moderate to severe TR  PULMONIC VALVE: Grossly normal. No PR  INTERATRIAL SEPTUM: No ASD/PFO  PERICARDIUM: No effusion  DESCENDING AORTA: Mild plaque   CARDIOVERSION:     Indications:  Atrial Fibrillation  Procedure Details:  Once the TEE was complete, the patient had the defibrillator pads placed in the anterior and posterior position. Once an appropriate level of sedation was achieved, the patient received a single biphasic, synchronized 200J shock with prompt conversion to sinus rhythm. No apparent complications. Confirmed by intra-cardiac electrograms.    Will check BCx and review lead vegetation with EP. No  current infectious symptoms.  Glori Bickers, MD  9:52 AM

## 2019-04-01 NOTE — Progress Notes (Signed)
  Echocardiogram Echocardiogram Transesophageal has been performed.  [name] 04/01/2019, 9:55 AM

## 2019-04-01 NOTE — Discharge Instructions (Signed)
Electrical Cardioversion Electrical cardioversion is the delivery of a jolt of electricity to restore a normal rhythm to the heart. A rhythm that is too fast or is not regular keeps the heart from pumping well. In this procedure, sticky patches or metal paddles are placed on the chest to deliver electricity to the heart from a device.  What can I expect after the procedure?  Your blood pressure, heart rate, breathing rate, and blood oxygen level will be monitored until you leave the hospital or clinic.  Your heart rhythm will be watched to make sure it does not change.  You may have some redness on the skin where the shocks were given. If you have this you can use hydrocortisone cream or aloe vera.   Follow these instructions at home:  Do not drive for 24 hours if you were given a sedative during your procedure.  Take over-the-counter and prescription medicines only as told by your health care provider.  Ask your health care provider how to check your pulse. Check it often.  Rest for 48 hours after the procedure or as told by your health care provider.  Avoid or limit your caffeine use as told by your health care provider.  Keep all follow-up visits as told by your health care provider. This is important.  Contact a health care provider if:  You feel like your heart is beating too quickly or your pulse is not regular.  You have a serious muscle cramp that does not go away.  Get help right away if:  You have discomfort in your chest.  You are dizzy or you feel faint.  You have trouble breathing or you are short of breath.  Your speech is slurred.  You have trouble moving an arm or leg on one side of your body.  Your fingers or toes turn cold or blue.  Summary  Electrical cardioversion is the delivery of a jolt of electricity to restore a normal rhythm to the heart.  This procedure may be done right away in an emergency or may be a scheduled procedure if the condition  is not an emergency.  Generally, this is a safe procedure.  After the procedure, check your pulse often as told by your health care provider.  This information is not intended to replace advice given to you by your health care provider. Make sure you discuss any questions you have with your health care provider. Document Revised: 09/20/2018 Document Reviewed: 09/20/2018 Elsevier Patient Education  [place].

## 2019-04-01 NOTE — Anesthesia Postprocedure Evaluation (Signed)
Anesthesia Post Note  Patient: [name]  Procedure(s) Performed: TRANSESOPHAGEAL ECHOCARDIOGRAM (TEE) (N/A ) CARDIOVERSION (N/A )     Patient location during evaluation: Endoscopy Anesthesia Type: General Level of consciousness: awake and alert Pain management: pain level controlled Vital Signs Assessment: post-procedure vital signs reviewed and stable Respiratory status: spontaneous breathing, nonlabored ventilation, respiratory function stable and patient connected to nasal cannula oxygen Cardiovascular status: blood pressure returned to baseline and stable Postop Assessment: no apparent nausea or vomiting Anesthetic complications: no    Last Vitals:  Vitals:   04/01/19 1026 04/01/19 1036  BP: 97/65 (!) 94/57  Pulse: 75 68  Resp: 19 (!) 24  Temp:    SpO2: 100% 100%    Last Pain:  Vitals:   04/01/19 1036  TempSrc:   PainSc: 0-No pain                 Lanell Dubie P Reise Hietala

## 2019-04-01 NOTE — Transfer of Care (Signed)
Immediate Anesthesia Transfer of Care Note  Patient: [name]  Procedure(s) Performed: TRANSESOPHAGEAL ECHOCARDIOGRAM (TEE) (N/A ) CARDIOVERSION (N/A )  Patient Location: Endoscopy Unit  Anesthesia Type:General  Level of Consciousness: drowsy and patient cooperative  Airway & Oxygen Therapy: Patient Spontanous Breathing and Patient connected to nasal cannula oxygen  Post-op Assessment: Report given to RN and Post -op Vital signs reviewed and stable  Post vital signs: Reviewed and stable  Last Vitals:  Vitals Value Taken Time  BP 107/87 04/01/19 0956  Temp 36.9 C 04/01/19 0956  Pulse 69 04/01/19 0957  Resp 23 04/01/19 0957  SpO2 96 % 04/01/19 0957  Vitals shown include unvalidated device data.  Last Pain:  Vitals:   04/01/19 0956  TempSrc: Oral  PainSc: 0-No pain         Complications: No apparent anesthesia complications

## 2019-04-01 NOTE — Interval H&P Note (Signed)
History and Physical Interval Note:  04/01/2019 9:23 AM  [name]  has presented today for surgery, with the diagnosis of A-FIB.  The various methods of treatment have been discussed with the patient and family. After consideration of risks, benefits and other options for treatment, the patient has consented to  Procedure(s): TRANSESOPHAGEAL ECHOCARDIOGRAM (TEE) (N/A) CARDIOVERSION (N/A) as a surgical intervention.  The patient's history has been reviewed, patient examined, no change in status, stable for surgery.  I have reviewed the patient's chart and labs.  Questions were answered to the patient's satisfaction.     Wilma Wuthrich

## 2019-04-06 ENCOUNTER — Other Ambulatory Visit: Payer: Self-pay

## 2019-04-06 ENCOUNTER — Encounter: Payer: Self-pay | Admitting: Student

## 2019-04-06 ENCOUNTER — Ambulatory Visit (INDEPENDENT_AMBULATORY_CARE_PROVIDER_SITE_OTHER): Payer: Medicare Other | Admitting: Student

## 2019-04-06 VITALS — BP 112/66 | HR 91 | Ht 64.0 in | Wt 184.6 lb

## 2019-04-06 DIAGNOSIS — I447 Left bundle-branch block, unspecified: Secondary | ICD-10-CM

## 2019-04-06 DIAGNOSIS — I340 Nonrheumatic mitral (valve) insufficiency: Secondary | ICD-10-CM | POA: Diagnosis not present

## 2019-04-06 DIAGNOSIS — I2510 Atherosclerotic heart disease of native coronary artery without angina pectoris: Secondary | ICD-10-CM | POA: Diagnosis not present

## 2019-04-06 DIAGNOSIS — I5023 Acute on chronic systolic (congestive) heart failure: Secondary | ICD-10-CM | POA: Diagnosis not present

## 2019-04-06 DIAGNOSIS — Z01812 Encounter for preprocedural laboratory examination: Secondary | ICD-10-CM | POA: Diagnosis not present

## 2019-04-06 DIAGNOSIS — I509 Heart failure, unspecified: Secondary | ICD-10-CM

## 2019-04-06 DIAGNOSIS — I4891 Unspecified atrial fibrillation: Secondary | ICD-10-CM | POA: Diagnosis not present

## 2019-04-06 LAB — CULTURE, BLOOD (ROUTINE X 2)
Culture: NO GROWTH
Culture: NO GROWTH

## 2019-04-06 LAB — CBC
Hematocrit: 37.1 % (ref 34.0–46.6)
Hemoglobin: 12.6 g/dL (ref 11.1–15.9)
MCH: 29.6 pg (ref 26.6–33.0)
MCHC: 34 g/dL (ref 31.5–35.7)
MCV: 87 fL (ref 79–97)
Platelets: 206 10*3/uL (ref 150–450)
RBC: 4.25 x10E6/uL (ref 3.77–5.28)
RDW: 14.4 % (ref 11.7–15.4)
WBC: 5.5 10*3/uL (ref 3.4–10.8)

## 2019-04-06 LAB — BASIC METABOLIC PANEL
BUN/Creatinine Ratio: 15 (ref 12–28)
BUN: 25 mg/dL (ref 8–27)
CO2: 23 mmol/L (ref 20–29)
Calcium: 9.8 mg/dL (ref 8.7–10.3)
Chloride: 99 mmol/L (ref 96–106)
Creatinine, Ser: 1.64 mg/dL — ABNORMAL HIGH (ref 0.57–1.00)
GFR calc Af Amer: 39 mL/min/{1.73_m2} — ABNORMAL LOW (ref >59)
GFR calc non Af Amer: 34 mL/min/{1.73_m2} — ABNORMAL LOW (ref >59)
Glucose: 126 mg/dL — ABNORMAL HIGH (ref 65–99)
Potassium: 4 mmol/L (ref 3.5–5.2)
Sodium: 139 mmol/L (ref 134–144)

## 2019-04-06 MED ORDER — AMIODARONE HCL 200 MG PO TABS: 400.0000 mg | ORAL_TABLET | Freq: Two times a day (BID) | ORAL | 3 refills | Status: DC

## 2019-04-06 NOTE — Progress Notes (Signed)
Electrophysiology Office Note Date: 04/06/2019  ID:  Robin, Fields 21-Dec-1955, MRN [id number]  PCP: Robin Hansen, MD Primary Cardiologist: Robin Bickers, MD Electrophysiologist: Dr. Caryl Fields   CC: Routine ICD follow-up  [name] is a 64 y.o. female with history of mixed ischemic/non-ischemic CHF, s/p CABG and mitral valve annuloplasty, and a relatively new diagnosis of Atrial fib, on Eliquis seen today for Dr. Caryl Fields.  They present today for programming evaluation (concern for atrial oversensing) and for post DCCV follow up. Unfortunately she is back in AF since 2/1. She feels better overall but not back to her normal. Denies orthopnea or PND. Still SOB with mild to moderate exertion. Denies chest pain or palpitations currently. She has not had ICD shocks.   Device History: Medtronic BiV ICD implanted 2011 -> CRT 2015 -> 11/2017 LV lead capped, HIS lead inserted -> 01/2018 HIS lead capped, LV re-incorporated, for chronic systolic CHF (ABANDONED HIS LEAD)   Past Medical History:  Diagnosis Date  . AICD (automatic cardioverter/defibrillator) present   . Chronic systolic heart failure ([place])    a. ICM b. ECHO (06/2013): EF 15%, severe MR (06/2013) c. RHC (10/2013): RA 5, RV 23/2/5, PA 25/6 (14), PCWP 12, Fick CO/CI: 3.2/1.7, PVR 0.7 WU, PA 45%, 47% and 55% (with levophed 5), vagal response during cath. d. On home milrinone.  . CKD (chronic kidney disease), stage III   . Coronary artery disease    a. s/p CABG x 5 with MV annuloplasty 2007   . Diabetes mellitus   . DVT (deep venous thrombosis) ([place]) 2009  . Hypertension   . Implantable defibrillator   medtronic   . Ischemic cardiomyopathy    a. s/p ICD. b. LHC (10/04/13): 1. Severe native CAD with all bypass grafts patent. c. CRT upgrade 12/2013.  [name] LBBB (left bundle branch block)   . Lipoma   . PAD (peripheral artery disease) ([place])   . PICC line infection    a. 01/2014 - PICC exchanged.  . Polysubstance abuse  ([place])    history of  (cocaine, tobacco and ETOH)  . Presence of permanent cardiac pacemaker   . [facility])    Past Surgical History:  Procedure Laterality Date  . ANKLE FRACTURE SURGERY Right    plate inserted  . ANKLE SURGERY Right    plate removed  . BI-VENTRICULAR IMPLANTABLE CARDIOVERTER DEFIBRILLATOR UPGRADE  12/02/2013   [facility] [place] CRTD upgrade by Dr [name]  . BI-VENTRICULAR IMPLANTABLE CARDIOVERTER DEFIBRILLATOR UPGRADE N/A 12/02/2013   Procedure: BI-VENTRICULAR IMPLANTABLE CARDIOVERTER DEFIBRILLATOR UPGRADE;  Surgeon: Deboraha Sprang, MD;  Location: [facility] CATH LAB;  Service: Cardiovascular;  Laterality: N/A;  . CARDIAC CATHETERIZATION    . CARDIAC DEFIBRILLATOR PLACEMENT     2011, Followed By Dr. Caryl Fields  . CARDIOVERSION N/A 04/01/2019   Procedure: CARDIOVERSION;  Surgeon: Jolaine Artist, MD;  Location: [place];  Service: Cardiovascular;  Laterality: N/A;  . COLONOSCOPY WITH PROPOFOL N/A 06/08/2014   Procedure: COLONOSCOPY WITH PROPOFOL;  Surgeon: Wilford Corner, MD;  Location: [facility] ENDOSCOPY;  Service: Endoscopy;  Laterality: N/A;  . COLONOSCOPY WITH PROPOFOL N/A 06/17/2017   Procedure: COLONOSCOPY WITH PROPOFOL;  Surgeon: Wilford Corner, MD;  Location: [place];  Service: Endoscopy;  Laterality: N/A;  . CORONARY ARTERY BYPASS GRAFT  10/2005  . ICD GENERATOR CHANGEOUT N/A 11/13/2017   Procedure: ICD GENERATOR CHANGEOUT;  Surgeon: Deboraha Sprang, MD;  Location: [place] CV LAB;  Service: Cardiovascular;  Laterality: N/A;  . LEAD REVISION/REPAIR N/A  11/13/2017   Procedure: LEAD REVISION/REPAIR;  Surgeon: Deboraha Sprang, MD;  Location: [place] CV LAB;  Service: Cardiovascular;  Laterality: N/A;  . LEAD REVISION/REPAIR N/A 02/03/2018   Procedure: LEAD REVISION/REPAIR;  Surgeon: Deboraha Sprang, MD;  Location: [place] CV LAB;  Service: Cardiovascular;  Laterality: N/A;  . LEFT AND RIGHT HEART CATHETERIZATION WITH CORONARY/GRAFT ANGIOGRAM N/A 03/17/2011   Procedure:  LEFT AND RIGHT HEART CATHETERIZATION WITH Beatrix Fetters;  Surgeon: Jolaine Artist, MD;  Location: [facility] CATH LAB;  Service: Cardiovascular;  Laterality: N/A;  . LEFT AND RIGHT HEART CATHETERIZATION WITH CORONARY/GRAFT ANGIOGRAM N/A 10/04/2013   Procedure: LEFT AND RIGHT HEART CATHETERIZATION WITH Beatrix Fetters;  Surgeon: Jolaine Artist, MD;  Location: [facility] CATH LAB;  Service: Cardiovascular;  Laterality: N/A;  . RIGHT HEART CATHETERIZATION N/A 05/23/2013   Procedure: RIGHT HEART CATH;  Surgeon: Jolaine Artist, MD;  Location: [facility] CATH LAB;  Service: Cardiovascular;  Laterality: N/A;  . RIGHT HEART CATHETERIZATION N/A 11/01/2013   Procedure: RIGHT HEART CATH;  Surgeon: Jolaine Artist, MD;  Location: [facility] CATH LAB;  Service: Cardiovascular;  Laterality: N/A;  . RIGHT HEART CATHETERIZATION N/A 11/25/2013   Procedure: RIGHT HEART CATH;  Surgeon: Jolaine Artist, MD;  Location: [facility] CATH LAB;  Service: Cardiovascular;  Laterality: N/A;  . RIGHT/LEFT HEART CATH AND CORONARY/GRAFT ANGIOGRAPHY N/A 03/10/2017   Procedure: RIGHT/LEFT HEART CATH AND CORONARY/GRAFT ANGIOGRAPHY;  Surgeon: Jolaine Artist, MD;  Location: [place] CV LAB;  Service: Cardiovascular;  Laterality: N/A;  . TEE WITHOUT CARDIOVERSION N/A 06/02/2013   Procedure: TRANSESOPHAGEAL ECHOCARDIOGRAM (TEE);  Surgeon: Larey Dresser, MD;  Location: [place];  Service: Cardiovascular;  Laterality: N/A;  . TEE WITHOUT CARDIOVERSION N/A 04/01/2019   Procedure: TRANSESOPHAGEAL ECHOCARDIOGRAM (TEE);  Surgeon: Jolaine Artist, MD;  Location: [facility] ENDOSCOPY;  Service: Cardiovascular;  Laterality: N/A;    Current Outpatient Medications  Medication Sig Dispense Refill  . ACCU-CHEK FASTCLIX LANCETS MISC Use to test blood glucose 3 times daily. Dx:E11.9 102 each 5  . acetaminophen (TYLENOL) 500 MG tablet Take 1,000 mg by mouth every 6 (six) hours as needed for moderate pain or headache.    . allopurinol (ZYLOPRIM) 100 MG  tablet Take 200 mg by mouth at bedtime.    [name] amoxicillin (AMOXIL) 500 MG capsule Take 2,000 mg by mouth See admin instructions. Take 2000 mg 1 hour prior to dental work    . apixaban (ELIQUIS) 5 MG TABS tablet Take 1 tablet (5 mg total) by mouth 2 (two) times daily. 60 tablet 3  . atorvastatin (LIPITOR) 80 MG tablet Take 80 mg by mouth daily.    . Blood Glucose Monitoring Suppl (ACCU-CHEK NANO SMARTVIEW) W/DEVICE KIT Use to test blood glucose 2 times daily. Dx:E11.9 1 kit 0  . canagliflozin (INVOKANA) 100 MG TABS tablet Take 100 mg by mouth daily before breakfast.    . Cholecalciferol (VITAMIN D3) 125 MCG (5000 UT) CAPS Take 5,000 Units by mouth daily.    . digoxin (LANOXIN) 0.25 MG tablet Take 0.0625 mg by mouth every other day.    . furosemide (LASIX) 80 MG tablet Take 80 mg by mouth daily. May take an additional 1/2 as needed    . glucose blood (ACCU-CHEK SMARTVIEW) test strip Use to test blood glucose 2-3 times daily. Dx:E11.9. INSULIN DEPENDENT 100 each 2  . Insulin Pen Needle 32G X 4 MM MISC Use to inject insulin 1 time a day 100 each 3  . levothyroxine (SYNTHROID) 25  MCG tablet Take 1 tablet (25 mcg total) by mouth daily before breakfast. 90 tablet 3  . losartan (COZAAR) 25 MG tablet Take 12.5 mg by mouth 2 (two) times daily.    . Melatonin 10 MG CAPS Take 10 mg by mouth at bedtime as needed (sleep).    . metolazone (ZAROXOLYN) 2.5 MG tablet Take 2.5 mg by mouth once a week. May take an additional tab ONLY as directed by CHF clinic    . nitroGLYCERIN (NITROSTAT) 0.4 MG SL tablet Place 0.4 mg under the tongue every 5 (five) minutes as needed for chest pain.    . potassium chloride SA (K-DUR,KLOR-CON) 20 MEQ tablet Take 1 tablet (20 mEq total) by mouth 2 (two) times daily. 90 tablet 11  . Semaglutide (RYBELSUS) 7 MG TABS Take 7 mg by mouth daily.    [name] amiodarone (PACERONE) 200 MG tablet Take 2 tablets (400 mg total) by mouth 2 (two) times daily. 90 tablet 3   No current facility-administered  medications for this visit.    Allergies:   Patient has no known allergies.   Social History: Social History   Socioeconomic History  . Marital status: Widowed    Spouse name: Not on file  . Number of children: Not on file  . Years of education: Not on file  . Highest education level: Not on file  Occupational History  . Occupation: disable    Employer: UNEMPLOYED  Tobacco Use  . Smoking status: Former Smoker    Years: 32.00    Types: Cigarettes    Quit date: 03/03/2011    Years since quitting: 8.0  . Smokeless tobacco: Never Used  . Tobacco comment: light smoker  Substance and Sexual Activity  . Alcohol use: No    Alcohol/week: 0.0 standard drinks  . Drug use: No    Comment: Has history of cocaine use, denies recent  . Sexual activity: Not on file  Other Topics Concern  . Not on file  Social History Narrative  . Not on file   Social Determinants of Health   Financial Resource Strain:   . Difficulty of Paying Living Expenses: Not on file  Food Insecurity:   . Worried About Charity fundraiser in the Last Year: Not on file  . Ran Out of Food in the Last Year: Not on file  Transportation Needs:   . Lack of Transportation (Medical): Not on file  . Lack of Transportation (Non-Medical): Not on file  Physical Activity:   . Days of Exercise per Week: Not on file  . Minutes of Exercise per Session: Not on file  Stress:   . Feeling of Stress : Not on file  Social Connections:   . Frequency of Communication with Friends and Family: Not on file  . Frequency of Social Gatherings with Friends and Family: Not on file  . Attends Religious Services: Not on file  . Active Member of Clubs or Organizations: Not on file  . Attends Archivist Meetings: Not on file  . Marital Status: Not on file  Intimate Partner Violence:   . Fear of Current or Ex-Partner: Not on file  . Emotionally Abused: Not on file  . Physically Abused: Not on file  . Sexually Abused: Not on  file    Family History: Family History  Problem Relation Age of Onset  . Heart attack Mother 62  . Heart attack Father   . CVA Father   . Diabetes Maternal Grandmother   .  Heart attack Cousin   . Heart attack Maternal Uncle     Review of Systems: All other systems reviewed and are otherwise negative except as noted above.   Physical Exam: Vitals:   04/06/19 0831  BP: 112/66  Pulse: 91  SpO2: 94%  Weight: 184 lb 9.6 oz (83.7 kg)  Height: '5\' 4"'$  (1.626 m)     GEN- The patient is well appearing, alert and oriented x 3 today.   HEENT: normocephalic, atraumatic; sclera clear, conjunctiva pink; hearing intact; oropharynx clear; neck supple, no JVP Lymph- no cervical lymphadenopathy Lungs- Clear to ausculation bilaterally, normal work of breathing.  No wheezes, rales, rhonchi Heart- Irregularly irregular rate and rhythm, no murmurs, rubs or gallops, PMI not laterally displaced GI- soft, non-tender, non-distended, bowel sounds present, no hepatosplenomegaly Extremities- no clubbing, cyanosis, or edema; DP/PT/radial pulses 2+ bilaterally MS- no significant deformity or atrophy Skin- warm and dry, no rash or lesion; ICD pocket well healed Psych- euthymic mood, full affect Neuro- strength and sensation are intact  ICD interrogation- reviewed in detail today,  See PACEART report  EKG:  EKG is not ordered today. Please see device interrogation.   Recent Labs: 02/22/2019: B Natriuretic Peptide 218.2 03/28/2019: ALT 25; BUN 31; Creatinine, Ser 1.90; Hemoglobin 13.3; Platelets 203; Potassium 3.7; Sodium 137; TSH 5.520   Wt Readings from Last 3 Encounters:  04/06/19 184 lb 9.6 oz (83.7 kg)  04/01/19 180 lb 12.4 oz (82 kg)  03/28/19 182 lb 6.4 oz (82.7 kg)     Other studies Reviewed: Additional studies/ records that were reviewed today include: Previous office notes, notes from AF clinic, TEE 04/01/2019 EF 20%, RA lead vegetation noted.  Assessment and Plan:  1.  Chronic  systolic dysfunction s/p Medtronic CRT-D  euvolemic today, but NYHA III symptoms in setting of AF.  Stable on an appropriate medical regimen Continue current diuretics. She has used sliding scale before and has been advised she can take extra metolazone prn.  Normal ICD function See [name] Art report No changes today. Sensing appropriately, and at times UNDERSENSING her AF. She appears to have been in AF for the past 48 hours, but device has read as many, smaller episodes.  She was previously on transplant list, and follows with Dr. Crissie Reese at the [facility] clinic at [facility]. She is blood type O.   2. Paroxysmal AF Underwent TEE/DCV 04/01/2019. Unfortunately she is back in AF today.  Continue Eliquis for CHA2DS2VASC of at least 7   Increase amiodarone to 400 mg BID and plan repeat DCCV. See discussion below concerning MV.   3. S/p MV annuloplasty 2007. Echo 2018 with stable valve and normal LA TEE 1/29 with a fixed posterior leaflet and severely dilated LA at 5.8 cm. Unfortunately, this may be the mechanism of her AF, and maintaining NSR may be difficult without having her valve addressed.   3. CAD Denies symptoms of ischemia  4. H/o VT On amiodarone. No intercurrent VT  5. RA lead vegetation Reviewed with Dr. Caryl Fields.  Will treat as a sterile vegetation unless any evidence of infection arises. No indication for lead extraction at this time.   Current medicines are reviewed at length with the patient today.   The patient does not have concerns regarding her medicines.  The following changes were made today:  none  Labs/ tests ordered today include:  Orders Placed This Encounter  Procedures  . CBC  . Basic Metabolic Panel (BMET)   Disposition:   Plan DCCV next week,  f/u with AF clinic week after. Further pending discussion between Dr. Caryl Fields and Dr. Haroldine Laws about condition of/need to address MV.   Jacalyn Lefevre, PA-C  04/06/2019 9:28 AM  [address] [place] [address] [phone] (office) [phone] (fax)

## 2019-04-06 NOTE — Patient Instructions (Addendum)
Medication Instructions:  INCREASE AMIODARONE TO 400 mg Twice per Day *If you need a refill on your cardiac medications before your next appointment, please call your pharmacy*  Lab Work:TODAY BMET CBC If you have labs (blood work) drawn today and your tests are completely normal, you will receive your results only by: [name] MyChart Message (if you have MyChart) OR . A paper copy in the mail If you have any lab test that is abnormal or we need to change your treatment, we will call you to review the results.  Testing/Procedures: Your physician has recommended that you have a Cardioversion (DCCV). Electrical Cardioversion uses a jolt of electricity to your heart either through paddles or wired patches attached to your chest. This is a controlled, usually prescheduled, procedure. Defibrillation is done under light anesthesia in the hospital, and you usually go home the day of the procedure. This is done to get your heart back into a normal rhythm. You are not awake for the procedure. Please see the instruction sheet given to you today.  CoVid Testing:  [facility]                           [place]                           Tuesday 2/9 @ 9:15 am  Dear [name] are scheduled for a Cardioversion on 04/15/19  with Dr. Debara Pickett .  Please arrive at the [facility] (Main Entrance A) at [facility]: [address] [place], [place] [address] at 6:30 am. (1 hour prior to procedure unless lab work is needed; if lab work is needed arrive 1.5 hours ahead)  DIET: Nothing to eat or drink after midnight except a sip of water with medications (see medication instructions below)  Medication Instructions: Hold Diabetic and Fluid Medication TAKE ALL OTHER MEDICATIONS  Labs: TODAY  You must have a responsible person to drive you home and stay in the waiting area during your procedure. Failure to do so could result in cancellation.  Bring your insurance cards.  *Special Note: Every  effort is made to have your procedure done on time. Occasionally there are emergencies that occur at the hospital that may cause delays. Please be patient if a delay does occur.   Follow-Up: At [facility], you and your health needs are our priority.  As part of our continuing mission to provide you with exceptional heart care, we have created designated Provider Care Teams.  These Care Teams include your primary Cardiologist (physician) and Advanced Practice Providers (APPs -  Physician Assistants and Nurse Practitioners) who all work together to provide you with the care you need, when you need it.  Your next appointment:  PLEASE SCHEDULE A FIB CLINIC APPOINTMENT 1 WEEK AFTER 2/12    Other Instructions Remote monitoring is used to monitor your  ICD from home. This monitoring reduces the number of office visits required to check your device to one time per year. It allows [place] to keep an eye on the functioning of your device to ensure it is working properly. You are scheduled for a device check from home on 06/23/19. You may send your transmission at any time that day. If you have a wireless device, the transmission will be sent automatically. After your physician reviews your transmission, you will receive a postcard with your next transmission date.

## 2019-04-07 ENCOUNTER — Ambulatory Visit (HOSPITAL_COMMUNITY): Payer: Medicare Other | Admitting: Nurse Practitioner

## 2019-04-07 LAB — CUP PACEART INCLINIC DEVICE CHECK
Battery Remaining Longevity: 26 mo
Battery Voltage: 2.92 V
Brady Statistic AP VP Percent: 3.43 %
Brady Statistic AP VS Percent: 0.51 %
Brady Statistic AS VP Percent: 75.42 %
Brady Statistic AS VS Percent: 20.63 %
Brady Statistic RA Percent Paced: 3.71 %
Brady Statistic RV Percent Paced: 78.63 %
Date Time Interrogation Session: 20210203094100
HighPow Impedance: 39 Ohm
HighPow Impedance: 52 Ohm
Implantable Lead Implant Date: 20111011200000
Implantable Lead Implant Date: 20151001200000
Implantable Lead Implant Date: 20151001200000
Implantable Lead Location: 753858
Implantable Lead Location: 753859
Implantable Lead Location: 753860
Implantable Lead Model: 4396
Implantable Lead Model: 5076
Implantable Lead Model: 6947
Implantable Pulse Generator Implant Date: 20190912200000
Lead Channel Impedance Value: 399 Ohm
Lead Channel Impedance Value: 437 Ohm
Lead Channel Impedance Value: 437 Ohm
Lead Channel Impedance Value: 456 Ohm
Lead Channel Impedance Value: 532 Ohm
Lead Channel Impedance Value: 817 Ohm
Lead Channel Pacing Threshold Amplitude: 1 V
Lead Channel Pacing Threshold Amplitude: 1.375 V
Lead Channel Pacing Threshold Amplitude: 3 V
Lead Channel Pacing Threshold Pulse Width: 0.4 ms
Lead Channel Pacing Threshold Pulse Width: 0.4 ms
Lead Channel Pacing Threshold Pulse Width: 1.5 ms
Lead Channel Sensing Intrinsic Amplitude: 0.125 mV
Lead Channel Sensing Intrinsic Amplitude: 0.5 mV
Lead Channel Sensing Intrinsic Amplitude: 16.25 mV
Lead Channel Sensing Intrinsic Amplitude: 23.875 mV
Lead Channel Setting Pacing Amplitude: 2 V
Lead Channel Setting Pacing Amplitude: 2.75 V
Lead Channel Setting Pacing Amplitude: 3.5 V
Lead Channel Setting Pacing Pulse Width: 0.4 ms
Lead Channel Setting Pacing Pulse Width: 1.5 ms
Lead Channel Setting Sensing Sensitivity: 0.45 mV

## 2019-04-12 ENCOUNTER — Other Ambulatory Visit (HOSPITAL_COMMUNITY)
Admission: RE | Admit: 2019-04-12 | Discharge: 2019-04-12 | Disposition: A | Discharge status: Home / Self Care | Payer: Medicare Other | Source: Ambulatory Visit | Attending: Internal Medicine | Admitting: Internal Medicine

## 2019-04-12 DIAGNOSIS — Z20822 Contact with and (suspected) exposure to covid-19: Secondary | ICD-10-CM | POA: Insufficient documentation

## 2019-04-12 DIAGNOSIS — Z01812 Encounter for preprocedural laboratory examination: Secondary | ICD-10-CM | POA: Insufficient documentation

## 2019-04-12 LAB — SARS CORONAVIRUS 2 (TAT 6-24 HRS): SARS Coronavirus 2: NEGATIVE

## 2019-04-14 ENCOUNTER — Other Ambulatory Visit (HOSPITAL_COMMUNITY): Payer: Self-pay | Admitting: *Deleted

## 2019-04-14 ENCOUNTER — Ambulatory Visit (HOSPITAL_COMMUNITY): Payer: Medicare Other | Admitting: Certified Registered Nurse Anesthetist

## 2019-04-14 NOTE — Progress Notes (Signed)
Attempted pre-op call for endo procedure, no answer- automated VM- no message left.

## 2019-04-15 ENCOUNTER — Other Ambulatory Visit: Payer: Self-pay

## 2019-04-15 ENCOUNTER — Encounter (HOSPITAL_COMMUNITY)
Admission: RE | Disposition: A | Discharge status: Home / Self Care | Payer: Self-pay | Source: Home / Self Care | Attending: Internal Medicine

## 2019-04-15 ENCOUNTER — Encounter (HOSPITAL_COMMUNITY): Payer: Self-pay | Admitting: Internal Medicine

## 2019-04-15 ENCOUNTER — Inpatient Hospital Stay (HOSPITAL_COMMUNITY)
Admission: RE | Admit: 2019-04-15 | Discharge: 2019-04-18 | DRG: 286 | Disposition: A | Discharge status: Home / Self Care | Payer: Medicare Other | Attending: Internal Medicine | Admitting: Internal Medicine

## 2019-04-15 DIAGNOSIS — Z20822 Contact with and (suspected) exposure to covid-19: Secondary | ICD-10-CM | POA: Diagnosis present

## 2019-04-15 DIAGNOSIS — Z86718 Personal history of other venous thrombosis and embolism: Secondary | ICD-10-CM | POA: Diagnosis not present

## 2019-04-15 DIAGNOSIS — I255 Ischemic cardiomyopathy: Secondary | ICD-10-CM | POA: Diagnosis present

## 2019-04-15 DIAGNOSIS — R000 Tachycardia, unspecified: Secondary | ICD-10-CM | POA: Diagnosis present

## 2019-04-15 DIAGNOSIS — Z6830 Body mass index (BMI) 30.0-30.9, adult: Secondary | ICD-10-CM

## 2019-04-15 DIAGNOSIS — Z8249 Family history of ischemic heart disease and other diseases of the circulatory system: Secondary | ICD-10-CM

## 2019-04-15 DIAGNOSIS — E039 Hypothyroidism, unspecified: Secondary | ICD-10-CM | POA: Diagnosis present

## 2019-04-15 DIAGNOSIS — I2510 Atherosclerotic heart disease of native coronary artery without angina pectoris: Secondary | ICD-10-CM | POA: Diagnosis present

## 2019-04-15 DIAGNOSIS — Z95810 Presence of automatic (implantable) cardiac defibrillator: Secondary | ICD-10-CM | POA: Diagnosis not present

## 2019-04-15 DIAGNOSIS — I5023 Acute on chronic systolic (congestive) heart failure: Secondary | ICD-10-CM | POA: Diagnosis present

## 2019-04-15 DIAGNOSIS — E669 Obesity, unspecified: Secondary | ICD-10-CM | POA: Diagnosis present

## 2019-04-15 DIAGNOSIS — I130 Hypertensive heart and chronic kidney disease with heart failure and stage 1 through stage 4 chronic kidney disease, or unspecified chronic kidney disease: Principal | ICD-10-CM | POA: Diagnosis present

## 2019-04-15 DIAGNOSIS — E1122 Type 2 diabetes mellitus with diabetic chronic kidney disease: Secondary | ICD-10-CM | POA: Diagnosis present

## 2019-04-15 DIAGNOSIS — E1151 Type 2 diabetes mellitus with diabetic peripheral angiopathy without gangrene: Secondary | ICD-10-CM | POA: Diagnosis present

## 2019-04-15 DIAGNOSIS — N183 Chronic kidney disease, stage 3 unspecified: Secondary | ICD-10-CM | POA: Diagnosis present

## 2019-04-15 DIAGNOSIS — I4819 Other persistent atrial fibrillation: Secondary | ICD-10-CM | POA: Diagnosis present

## 2019-04-15 DIAGNOSIS — I252 Old myocardial infarction: Secondary | ICD-10-CM | POA: Diagnosis not present

## 2019-04-15 DIAGNOSIS — Z833 Family history of diabetes mellitus: Secondary | ICD-10-CM

## 2019-04-15 DIAGNOSIS — Z951 Presence of aortocoronary bypass graft: Secondary | ICD-10-CM | POA: Diagnosis not present

## 2019-04-15 DIAGNOSIS — R0602 Shortness of breath: Secondary | ICD-10-CM | POA: Diagnosis not present

## 2019-04-15 DIAGNOSIS — I480 Paroxysmal atrial fibrillation: Secondary | ICD-10-CM | POA: Diagnosis not present

## 2019-04-15 HISTORY — PX: RIGHT HEART CATH: CATH118263

## 2019-04-15 LAB — POCT I-STAT EG7
Acid-Base Excess: 2 mmol/L (ref 0.0–2.0)
Acid-Base Excess: 1 mmol/L (ref 0.0–2.0)
Bicarbonate: 27.5 mmol/L (ref 20.0–28.0)
Bicarbonate: 26.3 mmol/L (ref 20.0–28.0)
Calcium, Ion: 1.18 mmol/L (ref 1.15–1.40)
Calcium, Ion: 1.12 mmol/L — ABNORMAL LOW (ref 1.15–1.40)
HCT: 35 % — ABNORMAL LOW (ref 36.0–46.0)
HCT: 34 % — ABNORMAL LOW (ref 36.0–46.0)
Hemoglobin: 11.9 g/dL — ABNORMAL LOW (ref 12.0–15.0)
Hemoglobin: 11.6 g/dL — ABNORMAL LOW (ref 12.0–15.0)
O2 Saturation: 63 %
O2 Saturation: 63 %
Potassium: 4.2 mmol/L (ref 3.5–5.1)
Potassium: 4 mmol/L (ref 3.5–5.1)
Sodium: 138 mmol/L (ref 135–145)
Sodium: 140 mmol/L (ref 135–145)
TCO2: 29 mmol/L (ref 22–32)
TCO2: 28 mmol/L (ref 22–32)
pCO2, Ven: 43.9 mmHg — ABNORMAL LOW (ref 44.0–60.0)
pCO2, Ven: 44.3 mmHg (ref 44.0–60.0)
pH, Ven: 7.405 (ref 7.250–7.430)
pH, Ven: 7.382 (ref 7.250–7.430)
pO2, Ven: 33 mmHg (ref 32.0–45.0)
pO2, Ven: 34 mmHg (ref 32.0–45.0)

## 2019-04-15 LAB — DIGOXIN LEVEL: Digoxin Level: 0.5 ng/mL — ABNORMAL LOW (ref 0.8–2.0)

## 2019-04-15 LAB — GLUCOSE, CAPILLARY: Glucose-Capillary: 147 mg/dL — ABNORMAL HIGH (ref 70–99)

## 2019-04-15 LAB — MRSA PCR SCREENING: MRSA by PCR: NEGATIVE

## 2019-04-15 SURGERY — CANCELLED PROCEDURE
Anesthesia: General

## 2019-04-15 SURGERY — RIGHT HEART CATH
Anesthesia: LOCAL

## 2019-04-15 MED ORDER — SODIUM CHLORIDE 0.9% FLUSH
3.0000 mL | Freq: Two times a day (BID) | INTRAVENOUS | Status: DC
  Administered 2019-04-16 – 2019-04-18 (×5): 3 mL via INTRAVENOUS

## 2019-04-15 MED ORDER — SODIUM CHLORIDE 0.9 % IV SOLN: 250.0000 mL | INTRAVENOUS | Status: DC | PRN

## 2019-04-15 MED ORDER — ALLOPURINOL 100 MG PO TABS
200.0000 mg | ORAL_TABLET | Freq: Every day | ORAL | Status: DC
  Administered 2019-04-15 – 2019-04-17 (×3): 200 mg via ORAL
  Filled 2019-04-15 (×4): qty 2

## 2019-04-15 MED ORDER — LABETALOL HCL 5 MG/ML IV SOLN: 10.0000 mg | INTRAVENOUS | Status: AC | PRN

## 2019-04-15 MED ORDER — FENTANYL CITRATE (PF) 100 MCG/2ML IJ SOLN
INTRAMUSCULAR | Status: DC | PRN
  Administered 2019-04-15: 25 ug via INTRAVENOUS

## 2019-04-15 MED ORDER — SODIUM CHLORIDE 0.9% FLUSH: 3.0000 mL | INTRAVENOUS | Status: DC | PRN

## 2019-04-15 MED ORDER — ONDANSETRON HCL 4 MG/2ML IJ SOLN: 4.0000 mg | Freq: Four times a day (QID) | INTRAMUSCULAR | Status: DC | PRN

## 2019-04-15 MED ORDER — HEPARIN (PORCINE) IN NACL 1000-0.9 UT/500ML-% IV SOLN
INTRAVENOUS | Status: DC | PRN
  Administered 2019-04-15: 500 mL

## 2019-04-15 MED ORDER — FUROSEMIDE 10 MG/ML IJ SOLN
80.0000 mg | Freq: Two times a day (BID) | INTRAMUSCULAR | Status: DC
  Administered 2019-04-15 – 2019-04-16 (×2): 80 mg via INTRAVENOUS
  Filled 2019-04-15 (×2): qty 8

## 2019-04-15 MED ORDER — ACETAMINOPHEN 500 MG PO TABS: 1000.0000 mg | ORAL_TABLET | Freq: Four times a day (QID) | ORAL | Status: DC | PRN

## 2019-04-15 MED ORDER — AMIODARONE HCL 200 MG PO TABS
200.0000 mg | ORAL_TABLET | Freq: Two times a day (BID) | ORAL | Status: DC
  Administered 2019-04-15 – 2019-04-18 (×6): 200 mg via ORAL
  Filled 2019-04-15 (×6): qty 1

## 2019-04-15 MED ORDER — HEPARIN (PORCINE) IN NACL 1000-0.9 UT/500ML-% IV SOLN
INTRAVENOUS | Status: AC
  Filled 2019-04-15: qty 500

## 2019-04-15 MED ORDER — SODIUM CHLORIDE 0.9 % IV SOLN
INTRAVENOUS | Status: DC
  Administered 2019-04-15: 09:00:00 10 mL/h via INTRAVENOUS

## 2019-04-15 MED ORDER — APIXABAN 5 MG PO TABS
5.0000 mg | ORAL_TABLET | Freq: Two times a day (BID) | ORAL | Status: DC
  Administered 2019-04-15 – 2019-04-18 (×6): 5 mg via ORAL
  Filled 2019-04-15 (×6): qty 1

## 2019-04-15 MED ORDER — MIDAZOLAM HCL 2 MG/2ML IJ SOLN
INTRAMUSCULAR | Status: AC
  Filled 2019-04-15: qty 2

## 2019-04-15 MED ORDER — ACETAMINOPHEN 325 MG PO TABS
650.0000 mg | ORAL_TABLET | ORAL | Status: DC | PRN
  Administered 2019-04-15 – 2019-04-17 (×2): 650 mg via ORAL
  Filled 2019-04-15 (×2): qty 2

## 2019-04-15 MED ORDER — FENTANYL CITRATE (PF) 100 MCG/2ML IJ SOLN
INTRAMUSCULAR | Status: AC
  Filled 2019-04-15: qty 2

## 2019-04-15 MED ORDER — CHLORHEXIDINE GLUCONATE CLOTH 2 % EX PADS
6.0000 | MEDICATED_PAD | Freq: Every day | CUTANEOUS | Status: DC
  Administered 2019-04-16 – 2019-04-17 (×2): 6 via TOPICAL

## 2019-04-15 MED ORDER — ACETAMINOPHEN 325 MG PO TABS: 650.0000 mg | ORAL_TABLET | ORAL | Status: DC | PRN

## 2019-04-15 MED ORDER — LOSARTAN POTASSIUM 25 MG PO TABS
12.5000 mg | ORAL_TABLET | Freq: Two times a day (BID) | ORAL | Status: DC
  Administered 2019-04-15 – 2019-04-18 (×6): 12.5 mg via ORAL
  Filled 2019-04-15 (×6): qty 1

## 2019-04-15 MED ORDER — LIDOCAINE HCL (PF) 1 % IJ SOLN
INTRAMUSCULAR | Status: DC | PRN
  Administered 2019-04-15: 2 mL via INTRADERMAL

## 2019-04-15 MED ORDER — LIDOCAINE HCL (PF) 1 % IJ SOLN
INTRAMUSCULAR | Status: AC
  Filled 2019-04-15: qty 30

## 2019-04-15 MED ORDER — MIDAZOLAM HCL 2 MG/2ML IJ SOLN
INTRAMUSCULAR | Status: DC | PRN
  Administered 2019-04-15: 1 mg via INTRAVENOUS

## 2019-04-15 MED ORDER — SODIUM CHLORIDE 0.9% FLUSH
3.0000 mL | Freq: Two times a day (BID) | INTRAVENOUS | Status: DC
  Administered 2019-04-17: 22:00:00 3 mL via INTRAVENOUS

## 2019-04-15 MED ORDER — CANAGLIFLOZIN 100 MG PO TABS
100.0000 mg | ORAL_TABLET | Freq: Every day | ORAL | Status: DC
  Administered 2019-04-16 – 2019-04-18 (×3): 100 mg via ORAL
  Filled 2019-04-15 (×3): qty 1

## 2019-04-15 MED ORDER — SODIUM CHLORIDE 0.9% FLUSH: 3.0000 mL | Freq: Two times a day (BID) | INTRAVENOUS | Status: DC

## 2019-04-15 MED ORDER — DIGOXIN 125 MCG PO TABS
0.0625 mg | ORAL_TABLET | ORAL | Status: DC
  Administered 2019-04-16 – 2019-04-18 (×2): 0.0625 mg via ORAL
  Filled 2019-04-15 (×2): qty 1

## 2019-04-15 MED ORDER — HYDRALAZINE HCL 20 MG/ML IJ SOLN: 10.0000 mg | INTRAMUSCULAR | Status: AC | PRN

## 2019-04-15 MED ORDER — ATORVASTATIN CALCIUM 80 MG PO TABS
80.0000 mg | ORAL_TABLET | Freq: Every day | ORAL | Status: DC
  Administered 2019-04-16 – 2019-04-18 (×3): 80 mg via ORAL
  Filled 2019-04-15 (×3): qty 1

## 2019-04-15 MED ORDER — POTASSIUM CHLORIDE CRYS ER 20 MEQ PO TBCR
20.0000 meq | EXTENDED_RELEASE_TABLET | Freq: Two times a day (BID) | ORAL | Status: DC
  Administered 2019-04-15 – 2019-04-16 (×3): 20 meq via ORAL
  Filled 2019-04-15 (×3): qty 1

## 2019-04-15 SURGICAL SUPPLY — 5 items
CATH BALLN WEDGE 5F 110CM (CATHETERS) ×4 IMPLANT
PACK CARDIAC CATHETERIZATION (CUSTOM PROCEDURE TRAY) ×2 IMPLANT
SHEATH GLIDE SLENDER 4/5FR (SHEATH) ×2 IMPLANT
SLEEVE REPOSITIONING LENGTH 30 (MISCELLANEOUS) ×2 IMPLANT
TRANSDUCER W/STOPCOCK (MISCELLANEOUS) ×2 IMPLANT

## 2019-04-15 NOTE — H&P (Signed)
Patient ID: [name], female   DOB: 08-Oct-1955, 64 y.o.   MRN: [id number]    Advanced Heart Failure H&P PCP: Dr. Posey Pronto  Primary HF:  Dr. Haroldine Laws  EP: Dr Beaulah Dinning Transplant : Dr Radene Knee   HPI: [name] is a 64 y.o. female with history of HTN, DM2, past polysubstance abuse (ETOH, tobacco, cocaine), CAD s/p CABG x5 with MV annuloplasty (2007), ischemic cardiomyopathy s/p Medtronic CRT-D, chronic systolic HF EF 76-28%, CKD stage III and PAD. Blood type O+.  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 At time of cath LAD, LCX and RCA occluded. EF 45%. Had PCI of LAD followed by abrupt stent occlusion. Had repeat PCI of LAD and then underwent CABG x 5 with mitral valve annuloplasty in 2007.   Amitted 8/2-10/07/13 for syncope s/p ICD shock. Concern that VT was possibly from ischemia and taken for [facility]. Started on Amiodarone. Cardiac output dropped while in hospital so started on milrinone. Discharged home. Underwent CRT-D upgrade on 12/02/13  In 12/15, she was admitted with catheter infection. She was admitted and catheter was replaced. Milrinone titrated off in January 2016 and has done reasonably well.   Currently undergoing heart-kidney transplant eval with Dr. Radene Knee at [facility]   Underwent Generator change and lead revision 11/13/17. Goal was for HIS bundle pacing, but ended up with septal pacing. Was feeling bad after and saw Dr. Caryl Comes on 12/11/17.  Now s/p lead revision. HIS lead was capped and LV lead was incorporated into system.   Recently struggling with AF. I did DC-CV on 04/01/19 but went back to AF. Saw Dr. Caryl Comes on 04/06/19 and back in AF and feeling very poorly. Amio increased and scheduled fro repeat DC-CV today. On arrival device interrogation showed conversion to NSR last night. Feels terrible NYHA IIIB-IV. Unable to do ADLS without significant fatigue and SOB. No CP or edema.    Cardiac studies:  TEE 2/21:  LVEF  20% RV moderately HK Severe MR/TR  RHC [facility] 03/10/2017  Ao = 99/59 (75) LV = 104/22 RA = 11 RV = 57/13 PA = 61/28 (39) PCW = 24 (v = 35-40) Fick cardiac output/index = 5.5/2.9 Thermo CO/CI = 6.9/3.7 PVR = 2.2 WU FA sat = 96% PA sat = 65%, 64% Assessment: 1. Severe 3v CAD 2. All 5 grafts open 3. Moderately elevated biventricular pressures with normal cardiac output. Prominent v-waves in PCW tracing c/w with significant MR  Echos: 05/19/2012  EF 15% 2/15 EF 15%, s/p MV repair with moderate-severe MR.  4/15: EF 15%, severe MR, mod TR 2/17 EF 20-25% 9/18 Echo EF 20-25% RV normal Mod MR   CPX (2/20): peak VO2 15.3 (80 % predicted) VE/VCO2 slope 42  RER 1.10 CPX (4/14): peak VO2 14.8 (68.5% predicted) VE/VCO2 slope 43  RER 1.12 CPX (3/15): peak VO2 15.6 (74.5% predicted) VE/VCO2 slope 41, RER 1.2 CPX (6/16): peak VO2:13.3 (68.6% predicted) VE/VCO2 slope:34  RER 1.12 CPX (6/17): peak VO2 13.3 (71.0% predicted) VE/VCO2 slope 41  RER 1.19   FH: Mother deceased: CAD, DM2, HTN        Father deceased: stroke  SH: Works odds/end jobs for [employer]; disabled. Lives in [place] with 2 sons([name] and [name]).   Review of systems complete and found to be negative unless listed in HPI.    Past Medical History:  Diagnosis Date  . AICD (automatic cardioverter/defibrillator) present   . Chronic systolic heart failure ([place])  a. ICM b. ECHO (06/2013): EF 15%, severe MR (06/2013) c. RHC (10/2013): RA 5, RV 23/2/5, PA 25/6 (14), PCWP 12, Fick CO/CI: 3.2/1.7, PVR 0.7 WU, PA 45%, 47% and 55% (with levophed 5), vagal response during cath. d. On home milrinone.  . CKD (chronic kidney disease), stage III   . Coronary artery disease    a. s/p CABG x 5 with MV annuloplasty 2007   . Diabetes mellitus   . DVT (deep venous thrombosis) ([place]) 2009  . Hypertension   . Implantable defibrillator   medtronic   . Ischemic cardiomyopathy    a. s/p ICD. b. LHC (10/04/13): 1. Severe native CAD with all bypass  grafts patent. c. CRT upgrade 12/2013.  [name] LBBB (left bundle branch block)   . Lipoma   . PAD (peripheral artery disease) ([place])   . PICC line infection    a. 01/2014 - PICC exchanged.  . Polysubstance abuse ([place])    history of  (cocaine, tobacco and ETOH)  . Presence of permanent cardiac pacemaker   . V-tach [facility])     Current Facility-Administered Medications  Medication Dose Route Frequency Provider Last Rate Last Admin  . 0.9 %  sodium chloride infusion  250 mL Intravenous PRN Adreona Brand, Shaune Pascal, MD      . 0.9 %  sodium chloride infusion   Intravenous Continuous [place] Kappes, Shaune Pascal, MD 10 mL/hr at 04/15/19 0928 10 mL/hr at 04/15/19 2229  . Heparin (Porcine) in NaCl 1000-0.9 UT/500ML-% SOLN    PRN Merilee Wible, Shaune Pascal, MD   500 mL at 04/15/19 0943  . sodium chloride flush (NS) 0.9 % injection 3 mL  3 mL Intravenous Q12H Alix Stowers R, MD      . sodium chloride flush (NS) 0.9 % injection 3 mL  3 mL Intravenous PRN Darian Ace, Shaune Pascal, MD       Vitals:   04/15/19 0700 04/15/19 0822 04/15/19 0830 04/15/19 0845  BP: 102/60 (!) 98/59 (!) 99/43 120/61  Pulse:   (!) 58   Resp: 15  (!) 21   Temp: 97.8 F (36.6 C)     TempSrc: Oral     SpO2: 96% 97%  97%  Weight: 81.6 kg     Height: 5\' 4"  (1.626 m)        Wt Readings from Last 3 Encounters:  04/15/19 81.6 kg  04/06/19 83.7 kg  04/01/19 82 kg     PHYSICAL EXAM: General:  Weak appearing. No resp difficulty HEENT: normal Neck: supple. JVP 8-9. Carotids 2+ bilat; no bruits. No lymphadenopathy or thryomegaly appreciated. Cor: PMI nondisplaced. Regular rate & rhythm. 2/6 MR/TR Lungs: clear Abdomen: soft, nontender, nondistended. No hepatosplenomegaly. No bruits or masses. Good bowel sounds. Extremities: no cyanosis, clubbing, rash, tr edema Neuro: alert & orientedx3, cranial nerves grossly intact. moves all 4 extremities w/o difficulty. Affect pleasant  ASSESSMENT & PLAN:  1. Acute on Chronic systolic CHF: Ischemic  cardiomyopathy s/p Medtronic CRT-D,  - Had LV lead revision in 9/19 and unable to place lead in CS so received HIS lead placement. Underwent lead revision 02/03/18 with cessation of HIS lead pacing and LV lead  - TEE 2/21: EF 20-25% RV moderately down severe MR/TR - CPX 2/20 peak VO2 15.3 (80 % predicted) VE/VCO2 slope 42  RER 1.10 - Much worse with recent AF. Now NYHA IIIB-IV - Will plan RHC to further evaluate. D/w Dr. Radene Knee at [facility] today suspect we will need to admit and transfer for inpatient transplant (vs VAD  eval) -> Can do VAD here if not transplant canddiate - Remains on  lasix 80 mg daily and metolazone every Friday.   - Intolerant entresto and carvedilol due to hypotension. Tried multiple times.  - Continue losartan 12.5 mg daily.  - [name] stopped with worsening renal function.  - Continue digoxin 0.0625 mg daily. - For RHC today with possible admit to follow.   2. CAD:  - Stable on cath 1/19 - No s/s ischemia - Continue ASA/statin  3. Persistent AF - very poorly tolerated.  - back in NSR today on po amio. Feels poorly. On Apixaban   4. Mitral regurgitation:  - S/p mitral valve annuloplasty with CABG in 2007. Moderate to severe MR on echo in 4/15. TEE (06/2013) mitral regurgitation appeared severe, anatomy of repaired valve does not look suitable for MV clipping and would be high risk for MV replacement.  - Severe by TEE 2/21  5. CKD stage III:  - Renal US - no hydronephrosis,. - Creatinine runs 1.6 -1.9 - Follows with [facility]  6.  NSVT:  - Continue amiodarone 100 mg.daily  - TSH elelvated on 5/13. Started thyroid medications.  - No VT noted on ICD interrogation   7. Obesity - Body mass index is 30.9 kg/m.   7. PAD:  - Symptoms stable.   - Right mid SFA stenosis per [place] 08/30/14.    8. Hypothyroidism - TSH elevated 07/13/2017. PCP started synthroid. - Following with Endo.  - No change   Glori Bickers, MD  9:48 AM

## 2019-04-15 NOTE — Anesthesia Preprocedure Evaluation (Deleted)
Anesthesia Evaluation  Patient identified by MRN, date of birth, ID band Patient awake    Reviewed: Allergy & Precautions, NPO status , Patient's Chart, lab work & pertinent test results  Airway Mallampati: III  TM Distance: >3 FB Neck ROM: Full    Dental  (+) Chipped,    Pulmonary former smoker,    Pulmonary exam normal breath sounds clear to auscultation       Cardiovascular hypertension, Pt. on medications + CAD, + CABG, + Peripheral Vascular Disease, +CHF and + DVT  Normal cardiovascular exam+ dysrhythmias Atrial Fibrillation + pacemaker + Cardiac Defibrillator  Rhythm:Regular Rate:Normal  ECG: a-fib   Neuro/Psych negative neurological ROS  negative psych ROS   GI/Hepatic negative GI ROS, (+) Cirrhosis     substance abuse  ,   Endo/Other  diabetes, Insulin DependentHypothyroidism   Renal/GU Renal InsufficiencyRenal disease     Musculoskeletal Gout   Abdominal (+) + obese,   Peds  Hematology HLD   Anesthesia Other Findings A-FIB  Reproductive/Obstetrics                             Anesthesia Physical  Anesthesia Plan  ASA: IV  Anesthesia Plan: General   Post-op Pain Management:    Induction: Intravenous  PONV Risk Score and Plan: 3 and Treatment may vary due to age or medical condition  Airway Management Planned: Mask  Additional Equipment:   Intra-op Plan:   Post-operative Plan:   Informed Consent: I have reviewed the patients History and Physical, chart, labs and discussed the procedure including the risks, benefits and alternatives for the proposed anesthesia with the patient or authorized representative who has indicated his/her understanding and acceptance.     Dental advisory given  Plan Discussed with: CRNA  Anesthesia Plan Comments:         Anesthesia Quick Evaluation

## 2019-04-15 NOTE — Progress Notes (Signed)
Received pt from ENdo alert and oriented, skin warm and dry.  Pt c/o  "not feeling well".  Pt on monitor, consent signed and IV started X2 per order.  Pt to cath lab for procedure.

## 2019-04-15 NOTE — Progress Notes (Signed)
Pt in SR on arrival for cardioversion.  Dr. Haroldine Laws aware, at bedside.  Pt to go to cath lab for cardiac cath with Dr. Haroldine Laws.   Vista Lawman, RN

## 2019-04-16 LAB — BASIC METABOLIC PANEL
Anion gap: 9 (ref 5–15)
BUN: 23 mg/dL (ref 8–23)
CO2: 25 mmol/L (ref 22–32)
Calcium: 8.6 mg/dL — ABNORMAL LOW (ref 8.9–10.3)
Chloride: 103 mmol/L (ref 98–111)
Creatinine, Ser: 1.76 mg/dL — ABNORMAL HIGH (ref 0.44–1.00)
GFR calc Af Amer: 36 mL/min — ABNORMAL LOW (ref >60)
GFR calc non Af Amer: 31 mL/min — ABNORMAL LOW (ref >60)
Glucose, Bld: 115 mg/dL — ABNORMAL HIGH (ref 70–99)
Potassium: 3.9 mmol/L (ref 3.5–5.1)
Sodium: 137 mmol/L (ref 135–145)

## 2019-04-16 LAB — HEMOGLOBIN A1C
Hgb A1c MFr Bld: 7.5 % — ABNORMAL HIGH (ref 4.8–5.6)
Mean Plasma Glucose: 168.55 mg/dL

## 2019-04-16 LAB — COOXEMETRY PANEL
Carboxyhemoglobin: 1.5 % (ref 0.5–1.5)
Methemoglobin: 0.9 % (ref 0.0–1.5)
O2 Saturation: 68.4 %
Total hemoglobin: 10.7 g/dL — ABNORMAL LOW (ref 12.0–16.0)

## 2019-04-16 LAB — GLUCOSE, CAPILLARY
Glucose-Capillary: 146 mg/dL — ABNORMAL HIGH (ref 70–99)
Glucose-Capillary: 123 mg/dL — ABNORMAL HIGH (ref 70–99)
Glucose-Capillary: 148 mg/dL — ABNORMAL HIGH (ref 70–99)

## 2019-04-16 MED ORDER — METOLAZONE 5 MG PO TABS
5.0000 mg | ORAL_TABLET | Freq: Every day | ORAL | Status: DC
  Administered 2019-04-16: 11:00:00 5 mg via ORAL
  Filled 2019-04-16: qty 1

## 2019-04-16 MED ORDER — INSULIN ASPART 100 UNIT/ML ~~LOC~~ SOLN
0.0000 [IU] | Freq: Three times a day (TID) | SUBCUTANEOUS | Status: DC
  Administered 2019-04-16 – 2019-04-17 (×3): 1 [IU] via SUBCUTANEOUS

## 2019-04-16 MED ORDER — INSULIN ASPART 100 UNIT/ML ~~LOC~~ SOLN: 0.0000 [IU] | Freq: Every day | SUBCUTANEOUS | Status: DC

## 2019-04-16 MED ORDER — FUROSEMIDE 10 MG/ML IJ SOLN
80.0000 mg | Freq: Three times a day (TID) | INTRAMUSCULAR | Status: DC
  Administered 2019-04-16 – 2019-04-17 (×3): 80 mg via INTRAVENOUS
  Filled 2019-04-16 (×2): qty 8

## 2019-04-16 NOTE — Progress Notes (Signed)
Advanced Heart Failure Rounding Note   Subjective:    Admitted 2/12 for elevated filling pressures. On IV lasix. Co-ox up to 68%   Diuresed ~2L. Weigh inaccurate. Breathing better but still SOB. Nausea improved with decreasing amio to 200 bid  Swan #s PA 58/28 (38)  PCWP 28 with v waves 45-50 Co-ox 68%   Objective:   Weight Range:  Vital Signs:   Temp:  [97 F (36.1 C)-98 F (36.7 C)] 97.7 F (36.5 C) (02/13 0804) Pulse Rate:  [50-73] 57 (02/13 0900) Resp:  [9-47] 19 (02/13 0900) BP: (87-115)/(45-75) 96/45 (02/13 0900) SpO2:  [91 %-100 %] 96 % (02/13 0900) Weight:  [83.7 kg] 83.7 kg (02/13 0600)    Weight change: Filed Weights   04/15/19 0700 04/16/19 0600  Weight: 81.6 kg 83.7 kg    Intake/Output:   Intake/Output Summary (Last 24 hours) at 04/16/2019 0919 Last data filed at 04/16/2019 0900 Gross per 24 hour  Intake 560 ml  Output 2000 ml  Net -1440 ml     Physical Exam: General:  Lying in bed. No resp difficulty HEENT: normal Neck: supple. JVP 10. Carotids 2+ bilat; no bruits. No lymphadenopathy or thryomegaly appreciated. Cor: PMI nondisplaced. Regular rate & rhythm. 2/6 MR Lungs: clear Abdomen: soft, nontender, nondistended. No hepatosplenomegaly. No bruits or masses. Good bowel sounds. Extremities: no cyanosis, clubbing, rash, edema Neuro: alert & orientedx3, cranial nerves grossly intact. moves all 4 extremities w/o difficulty. Affect pleasant  Telemetry: Sinus 60s Personally reviewed   Labs: Basic Metabolic Panel: Recent Labs  Lab 04/15/19 1037 04/15/19 1042 04/16/19 0324  NA 138 140 137  K 4.2 4.0 3.9  CL  --   --  103  CO2  --   --  25  GLUCOSE  --   --  115*  BUN  --   --  23  CREATININE  --   --  1.76*  CALCIUM  --   --  8.6*    Liver Function Tests: No results for input(s): AST, ALT, ALKPHOS, BILITOT, PROT, ALBUMIN in the last 168 hours. No results for input(s): LIPASE, AMYLASE in the last 168 hours. No results for  input(s): AMMONIA in the last 168 hours.  CBC: Recent Labs  Lab 04/15/19 1037 04/15/19 1042  HGB 11.9* 11.6*  HCT 35.0* 34.0*    Cardiac Enzymes: No results for input(s): CKTOTAL, CKMB, CKMBINDEX, TROPONINI in the last 168 hours.  BNP: BNP (last 3 results) Recent Labs    08/18/18 1308 02/22/19 1400  BNP 334.9* 218.2*    ProBNP (last 3 results) No results for input(s): PROBNP in the last 8760 hours.    Other results:  Imaging: CARDIAC CATHETERIZATION  Result Date: 04/15/2019 Findings: RA = 14 RV = 67/13 PA = 66/28 (43) PCW = 25 (v-waves to 45) Fick cardiac output/index = 4.2/2.2 PVR = 4.3 WU Ao sat = 98% PA sat = 63%, 63% PAPi = 2.7 RA/PCWP = 0.56 Assessment: 1. Biventricular HF with elevated filling pressures and moderately depressed CO Plan/Discussion: Will admit for IV diuresis. Follow Swan numbers. Suspect some of her symptoms related to amiodarone side effects. Start sildenafil. Hopefully can be discharged in next few days to proceed with outpatient transplant evaluation at [facility]. Glori Bickers, MD 11:14 AM      Medications:     Scheduled Medications: . allopurinol  200 mg Oral QHS  . amiodarone  200 mg Oral BID  . apixaban  5 mg Oral BID  . atorvastatin  80 mg Oral Daily  . canagliflozin  100 mg Oral QAC breakfast  . Chlorhexidine Gluconate Cloth  6 each Topical Daily  . digoxin  0.0625 mg Oral QODAY  . furosemide  80 mg Intravenous BID  . losartan  12.5 mg Oral BID  . potassium chloride SA  20 mEq Oral BID  . sodium chloride flush  3 mL Intravenous Q12H  . sodium chloride flush  3 mL Intravenous Q12H     Infusions: . sodium chloride    . sodium chloride       PRN Medications:  sodium chloride, sodium chloride, acetaminophen, ondansetron (ZOFRAN) IV, sodium chloride flush, sodium chloride flush   Assessment/Plan :    1. Acute on Chronic systolic CHF: Ischemic cardiomyopathy s/p Medtronic CRT-D,  - Had LV lead revision in 9/19 and unable  to place lead in CS so received HIS lead placement. Underwent lead revision 02/03/18 with cessation of HIS lead pacing and LV lead  - TEE 2/21: EF 20-25% RV moderately down severe MR/TR - CPX 2/20 peak VO2 15.3 (80 % predicted) VE/VCO2 slope 42  RER 1.10 - Much worse with recent AF. Now NYHA IIIB-IV - RHC 2/12:  RA 14 PA 66/28 PCWP 25 CI 2.2 - D/w Dr. Radene Knee at [facility]. Has outpatient transplant eval later this week -> Can do VAD here if not transplant canddiate Luiz Blare numbers done personally. Still volume overloaded. Increase lasix to 80 IV tid and add metolazone 5  - Intolerant entresto and carvedilol due to hypotension. Tried multiple times.  - Continue losartan 12.5 mg daily.  - [name] stopped with worsening renal function.  - Continue digoxin 0.0625 mg daily.  2. CAD:  - Stable on cath 1/19 - No s/s of ischemia - Continue ASA/statin  3. Persistent AF - very poorly tolerated.  - back in NSR today on po amio. - Could not tolerate amio 400 bid due to GI side effects. Decrease to 200 bid - On Apixaban   4. Mitral regurgitation:  - S/p mitral valve annuloplasty with CABG in 2007. Moderate to severe MR on echo in 4/15. TEE (06/2013) mitral regurgitation appeared severe, anatomy of repaired valve does not look suitable for MV clipping and would be high risk for MV replacement.  - Severe by TEE 2/21  5. CKD stage III:  - Renal US - no hydronephrosis,. - Creatinine runs 1.6 -1.9 - Stable 1.7 today - Follows with [employer] [facility] [employer]  6.  NSVT:  - Continue amiodarone 100 mg.daily  - TSH elelvated on 5/13. Started thyroid medications.  - No VT noted on ICD interrogation   7. Obesity - Body mass index is 30.9 kg/m.   7. PAD:  - Symptoms stable.   - Right mid SFA stenosis per [place] 08/30/14.    8. Hypothyroidism - TSH elevated 07/13/2017. PCP started synthroid. - Following with Endo.  - No change   CRITICAL CARE Performed by: Glori Bickers  Total critical care time: 35  minutes  Critical care time was exclusive of separately billable procedures and treating other patients.  Critical care was necessary to treat or prevent imminent or life-threatening deterioration.  Critical care was time spent personally by me (independent of midlevel providers or residents) on the following activities: development of treatment plan with patient and/or surrogate as well as nursing, discussions with consultants, evaluation of patient's response to treatment, examination of patient, obtaining history from patient or surrogate, ordering and performing treatments and interventions, ordering and review of laboratory studies,  ordering and review of radiographic studies, pulse oximetry and re-evaluation of patient's condition.     Length of Stay: 1   Glori Bickers MD 04/16/2019, 9:19 AM  Advanced Heart Failure Team Pager [phone] (M-F; 7a - 4p)  Please contact [facility] for night-coverage after hours (4p -7a ) and weekends on [email or web address]

## 2019-04-17 DIAGNOSIS — I480 Paroxysmal atrial fibrillation: Secondary | ICD-10-CM

## 2019-04-17 LAB — GLUCOSE, CAPILLARY
Glucose-Capillary: 111 mg/dL — ABNORMAL HIGH (ref 70–99)
Glucose-Capillary: 95 mg/dL (ref 70–99)
Glucose-Capillary: 127 mg/dL — ABNORMAL HIGH (ref 70–99)
Glucose-Capillary: 134 mg/dL — ABNORMAL HIGH (ref 70–99)

## 2019-04-17 LAB — BASIC METABOLIC PANEL
Anion gap: 14 (ref 5–15)
BUN: 32 mg/dL — ABNORMAL HIGH (ref 8–23)
CO2: 30 mmol/L (ref 22–32)
Calcium: 9.4 mg/dL (ref 8.9–10.3)
Chloride: 94 mmol/L — ABNORMAL LOW (ref 98–111)
Creatinine, Ser: 1.57 mg/dL — ABNORMAL HIGH (ref 0.44–1.00)
GFR calc Af Amer: 41 mL/min — ABNORMAL LOW (ref >60)
GFR calc non Af Amer: 35 mL/min — ABNORMAL LOW (ref >60)
Glucose, Bld: 101 mg/dL — ABNORMAL HIGH (ref 70–99)
Potassium: 3 mmol/L — ABNORMAL LOW (ref 3.5–5.1)
Sodium: 138 mmol/L (ref 135–145)

## 2019-04-17 LAB — COOXEMETRY PANEL
Carboxyhemoglobin: 1.4 % (ref 0.5–1.5)
Methemoglobin: 1 % (ref 0.0–1.5)
O2 Saturation: 62.3 %
Total hemoglobin: 12.1 g/dL (ref 12.0–16.0)

## 2019-04-17 MED ORDER — METOLAZONE 2.5 MG PO TABS
2.5000 mg | ORAL_TABLET | Freq: Once | ORAL | Status: AC
  Administered 2019-04-17: 12:00:00 2.5 mg via ORAL
  Filled 2019-04-17: qty 1

## 2019-04-17 MED ORDER — POTASSIUM CHLORIDE CRYS ER 20 MEQ PO TBCR: 40.0000 meq | EXTENDED_RELEASE_TABLET | Freq: Once | ORAL | Status: AC

## 2019-04-17 MED ORDER — POTASSIUM CHLORIDE CRYS ER 20 MEQ PO TBCR
40.0000 meq | EXTENDED_RELEASE_TABLET | ORAL | Status: AC
  Administered 2019-04-17 (×2): 40 meq via ORAL
  Filled 2019-04-17 (×2): qty 2

## 2019-04-17 MED ORDER — LEVOTHYROXINE SODIUM 25 MCG PO TABS
25.0000 ug | ORAL_TABLET | Freq: Every day | ORAL | Status: DC
  Administered 2019-04-18: 06:00:00 25 ug via ORAL
  Filled 2019-04-17: qty 1

## 2019-04-17 MED ORDER — METOLAZONE 5 MG PO TABS: 5.0000 mg | ORAL_TABLET | Freq: Once | ORAL | Status: AC

## 2019-04-17 MED ORDER — POTASSIUM CHLORIDE 10 MEQ/100ML IV SOLN
10.0000 meq | INTRAVENOUS | Status: DC
  Administered 2019-04-17: 10 meq via INTRAVENOUS
  Filled 2019-04-17 (×4): qty 100

## 2019-04-17 MED ORDER — POTASSIUM CHLORIDE CRYS ER 20 MEQ PO TBCR
20.0000 meq | EXTENDED_RELEASE_TABLET | Freq: Once | ORAL | Status: AC
  Administered 2019-04-17: 06:00:00 20 meq via ORAL
  Filled 2019-04-17: qty 1
  Filled 2019-04-17: qty 2

## 2019-04-17 MED ORDER — FUROSEMIDE 10 MG/ML IJ SOLN
INTRAMUSCULAR | Status: AC
  Filled 2019-04-17: qty 8

## 2019-04-17 NOTE — Progress Notes (Addendum)
Advanced Heart Failure Rounding Note   Subjective:    Admitted 2/12 for elevated filling pressures.   Lasix increased yesterday and metolazone added. Out almost 6L. Weight down 6 pounds. Co-ox stable at 62%.Creatinine stable at 1.5   Swan #s done personally PA 57/19 (37)  PCWP 20 with v waves 40-45 Co-ox 62%   Objective:   Weight Range:  Vital Signs:   Temp:  [97.4 F (36.3 C)-98.1 F (36.7 C)] 97.4 F (36.3 C) (02/14 0804) Pulse Rate:  [50-65] 53 (02/14 0800) Resp:  [14-28] 20 (02/14 0800) BP: (73-133)/(45-105) 90/49 (02/14 0800) SpO2:  [91 %-100 %] 95 % (02/14 0800) Weight:  [80.9 kg] 80.9 kg (02/14 0600) Last BM Date: 04/16/19  Weight change: Filed Weights   04/15/19 0700 04/16/19 0600 04/17/19 0600  Weight: 81.6 kg 83.7 kg 80.9 kg    Intake/Output:   Intake/Output Summary (Last 24 hours) at 04/17/2019 0855 Last data filed at 04/17/2019 0600 Gross per 24 hour  Intake 840 ml  Output 5850 ml  Net -5010 ml     Physical Exam: General:  Sitting in chair. No resp difficulty HEENT: normal Neck: supple. JVP 7-8. Carotids 2+ bilat; no bruits. No lymphadenopathy or thryomegaly appreciated. Cor: PMI nondisplaced. Regular rate & rhythm. No rubs, gallops or murmurs. 2/6 MR Lungs: clear Abdomen: soft, nontender, nondistended. No hepatosplenomegaly. No bruits or masses. Good bowel sounds. Extremities: no cyanosis, clubbing, rash, edema Neuro: alert & orientedx3, cranial nerves grossly intact. moves all 4 extremities w/o difficulty. Affect pleasant   Telemetry: Sinus 50-60s Personally reviewed   Labs: Basic Metabolic Panel: Recent Labs  Lab 04/15/19 1037 04/15/19 1042 04/16/19 0324 04/17/19 0402  NA 138 140 137 138  K 4.2 4.0 3.9 3.0*  CL  --   --  103 94*  CO2  --   --  25 30  GLUCOSE  --   --  115* 101*  BUN  --   --  23 32*  CREATININE  --   --  1.76* 1.57*  CALCIUM  --   --  8.6* 9.4    Liver Function Tests: No results for input(s): AST, ALT,  ALKPHOS, BILITOT, PROT, ALBUMIN in the last 168 hours. No results for input(s): LIPASE, AMYLASE in the last 168 hours. No results for input(s): AMMONIA in the last 168 hours.  CBC: Recent Labs  Lab 04/15/19 1037 04/15/19 1042  HGB 11.9* 11.6*  HCT 35.0* 34.0*    Cardiac Enzymes: No results for input(s): CKTOTAL, CKMB, CKMBINDEX, TROPONINI in the last 168 hours.  BNP: BNP (last 3 results) Recent Labs    08/18/18 1308 02/22/19 1400  BNP 334.9* 218.2*    ProBNP (last 3 results) No results for input(s): PROBNP in the last 8760 hours.    Other results:  Imaging: CARDIAC CATHETERIZATION  Result Date: 04/15/2019 Findings: RA = 14 RV = 67/13 PA = 66/28 (43) PCW = 25 (v-waves to 45) Fick cardiac output/index = 4.2/2.2 PVR = 4.3 WU Ao sat = 98% PA sat = 63%, 63% PAPi = 2.7 RA/PCWP = 0.56 Assessment: 1. Biventricular HF with elevated filling pressures and moderately depressed CO Plan/Discussion: Will admit for IV diuresis. Follow Swan numbers. Suspect some of her symptoms related to amiodarone side effects. Start sildenafil. Hopefully can be discharged in next few days to proceed with outpatient transplant evaluation at [facility]. Glori Bickers, MD 11:14 AM     Medications:     Scheduled Medications: . allopurinol  200 mg Oral  QHS  . amiodarone  200 mg Oral BID  . apixaban  5 mg Oral BID  . atorvastatin  80 mg Oral Daily  . canagliflozin  100 mg Oral QAC breakfast  . Chlorhexidine Gluconate Cloth  6 each Topical Daily  . digoxin  0.0625 mg Oral QODAY  . furosemide  80 mg Intravenous TID  . insulin aspart  0-5 Units Subcutaneous QHS  . insulin aspart  0-9 Units Subcutaneous TID WC  . losartan  12.5 mg Oral BID  . metolazone  5 mg Oral Daily  . potassium chloride SA  40 mEq Oral Q4H  . sodium chloride flush  3 mL Intravenous Q12H  . sodium chloride flush  3 mL Intravenous Q12H    Infusions: . sodium chloride    . sodium chloride      PRN Medications: sodium  chloride, sodium chloride, acetaminophen, ondansetron (ZOFRAN) IV, sodium chloride flush, sodium chloride flush   Assessment/Plan :    1. Acute on Chronic systolic CHF: Ischemic cardiomyopathy s/p Medtronic CRT-D,  - Had LV lead revision in 9/19 and unable to place lead in CS so received HIS lead placement. Underwent lead revision 02/03/18 with cessation of HIS lead pacing and LV lead  - TEE 2/21: EF 20-25% RV moderately down severe MR/TR - CPX 2/20 peak VO2 15.3 (80 % predicted) VE/VCO2 slope 42  RER 1.10 - Much worse with recent AF. Now NYHA IIIB-IV - RHC 2/12:  RA 14 PA 66/28 PCWP 25 CI 2.2 - D/w Dr. Radene Knee at [facility] Has outpatient transplant eval later this week -> Can do VAD here if not transplant canddiate Luiz Blare numbers done personally. Volume status much improved.  - Will give one more dose IV lasix and metolazone 2.5 - Intolerant entresto and carvedilol due to hypotension. Tried multiple times.  - Continue losartan 12.5 mg daily.  [name] stopped as ooutpatient with worsening renal function.  - Continue digoxin 0.0625 mg daily.  2. CAD:  - Stable on cath 1/19 - No s/s of ischemia - Continue ASA/statin  3. Persistent AF - very poorly tolerated.  - remains in NSR today on po amio. - Could not tolerate amio 400 bid due to GI side effects. Decrease to 200 bid - On Apixaban. No bleeding   4. Mitral regurgitation:  - S/p mitral valve annuloplasty with CABG in 2007. Moderate to severe MR on echo in 4/15. TEE (06/2013) mitral regurgitation appeared severe, anatomy of repaired valve does not look suitable for MV clipping and would be high risk for MV replacement.  - Severe by TEE 2/21  5. CKD stage III:  - Renal US - no hydronephrosis,. - Creatinine runs 1.6 -1.9 - Stable 1.57 today - Follows with [facility] [employer]  6.  NSVT:  - Continue amiodarone - Keep K > 4.0 Mg >2.0 - TSH elelvated on 5/13. Started thyroid medications.  - No VT noted on ICD interrogation   7.  Obesity - Body mass index is 30.9 kg/m.   7. PAD:  - Symptoms stable.   - Right mid SFA stenosis per [place] 08/30/14.    8. Hypothyroidism - TSH elevated 07/13/2017. PCP started synthroid. - Following with Endo.  - No change   Length of Stay: 2   Glori Bickers MD 04/17/2019, 8:55 AM  Advanced Heart Failure Team Pager [phone] (M-F; [place])  Please contact [facility] for night-coverage after hours (4p -7a ) and weekends on [email or web address]

## 2019-04-18 ENCOUNTER — Telehealth (HOSPITAL_COMMUNITY): Payer: Self-pay | Admitting: Cardiology

## 2019-04-18 LAB — CBC
HCT: 36.7 % (ref 36.0–46.0)
Hemoglobin: 11.6 g/dL — ABNORMAL LOW (ref 12.0–15.0)
MCH: 28.6 pg (ref 26.0–34.0)
MCHC: 31.6 g/dL (ref 30.0–36.0)
MCV: 90.4 fL (ref 80.0–100.0)
Platelets: 178 10*3/uL (ref 150–400)
RBC: 4.06 MIL/uL (ref 3.87–5.11)
RDW: 16.4 % — ABNORMAL HIGH (ref 11.5–15.5)
WBC: 5.6 10*3/uL (ref 4.0–10.5)
nRBC: 0 % (ref 0.0–0.2)

## 2019-04-18 LAB — COOXEMETRY PANEL
Carboxyhemoglobin: 1.1 % (ref 0.5–1.5)
Methemoglobin: 0.5 % (ref 0.0–1.5)
O2 Saturation: 62.9 %
Total hemoglobin: 11.9 g/dL — ABNORMAL LOW (ref 12.0–16.0)

## 2019-04-18 LAB — BASIC METABOLIC PANEL
Anion gap: 13 (ref 5–15)
BUN: 25 mg/dL — ABNORMAL HIGH (ref 8–23)
CO2: 27 mmol/L (ref 22–32)
Calcium: 9.3 mg/dL (ref 8.9–10.3)
Chloride: 97 mmol/L — ABNORMAL LOW (ref 98–111)
Creatinine, Ser: 1.74 mg/dL — ABNORMAL HIGH (ref 0.44–1.00)
GFR calc Af Amer: 36 mL/min — ABNORMAL LOW (ref >60)
GFR calc non Af Amer: 31 mL/min — ABNORMAL LOW (ref >60)
Glucose, Bld: 104 mg/dL — ABNORMAL HIGH (ref 70–99)
Potassium: 3.2 mmol/L — ABNORMAL LOW (ref 3.5–5.1)
Sodium: 137 mmol/L (ref 135–145)

## 2019-04-18 LAB — GLUCOSE, CAPILLARY: Glucose-Capillary: 110 mg/dL — ABNORMAL HIGH (ref 70–99)

## 2019-04-18 LAB — MAGNESIUM: Magnesium: 2.1 mg/dL (ref 1.7–2.4)

## 2019-04-18 MED ORDER — POTASSIUM CHLORIDE CRYS ER 20 MEQ PO TBCR
40.0000 meq | EXTENDED_RELEASE_TABLET | ORAL | Status: AC
  Administered 2019-04-18 (×2): 40 meq via ORAL
  Filled 2019-04-18 (×2): qty 2

## 2019-04-18 MED ORDER — POTASSIUM CHLORIDE CRYS ER 20 MEQ PO TBCR: 40.0000 meq | EXTENDED_RELEASE_TABLET | ORAL | Status: DC

## 2019-04-18 MED ORDER — LOSARTAN POTASSIUM 25 MG PO TABS: 12.5000 mg | ORAL_TABLET | Freq: Every day | ORAL | 6 refills | Status: AC

## 2019-04-18 MED ORDER — AMIODARONE HCL 200 MG PO TABS: 200.0000 mg | ORAL_TABLET | Freq: Two times a day (BID) | ORAL | 6 refills | Status: DC

## 2019-04-18 MED ORDER — LOSARTAN POTASSIUM 25 MG PO TABS: 12.5000 mg | ORAL_TABLET | Freq: Every day | ORAL | Status: DC

## 2019-04-18 MED FILL — AMIODARONE HCL 200 MG TAB: 200 | 30 days supply | Qty: 60 | Fill #0

## 2019-04-18 NOTE — Progress Notes (Signed)
Discharge information discussed with pt and all questions answered. Helped pt into car with son who was driving her home.

## 2019-04-18 NOTE — Progress Notes (Addendum)
Advanced Heart Failure Rounding Note   Subjective:    Admitted 2/12 for elevated filling pressures.   Yesterday diuresed with IV lasix + metolazone. Weight down another pound.   Wants to go home. Denies SOB.   Swan Brachial  PA 42/14    Objective:   Weight Range:  Vital Signs:   Temp:  [97.4 F (36.3 C)-98.4 F (36.9 C)] 98.4 F (36.9 C) (02/15 0814) Pulse Rate:  [49-66] 58 (02/15 0900) Resp:  [10-33] 24 (02/15 0900) BP: (66-126)/(39-75) 89/45 (02/15 0900) SpO2:  [89 %-97 %] 89 % (02/15 0900) Weight:  [81.6 kg] 81.6 kg (02/15 0600) Last BM Date: 04/16/19  Weight change: Filed Weights   04/16/19 0600 04/17/19 0600 04/18/19 0600  Weight: 83.7 kg 80.9 kg 81.6 kg    Intake/Output:   Intake/Output Summary (Last 24 hours) at 04/18/2019 0909 Last data filed at 04/18/2019 0800 Gross per 24 hour  Intake 840 ml  Output 1400 ml  Net -560 ml     Physical Exam: General:  In bed. No resp difficulty HEENT: normal Neck: supple. JVP 6-7. Carotids 2+ bilat; no bruits. No lymphadenopathy or thryomegaly appreciated. Cor: PMI nondisplaced. Regular rate & rhythm. No rubs, gallops or murmurs. Lungs: clear Abdomen: soft, nontender, nondistended. No hepatosplenomegaly. No bruits or masses. Good bowel sounds. Extremities: no cyanosis, clubbing, rash, edema. R brachial swan Neuro: alert & orientedx3, cranial nerves grossly intact. moves all 4 extremities w/o difficulty. Affect pleasant   Telemetry: SR 50s    Labs: Basic Metabolic Panel: Recent Labs  Lab 04/15/19 1037 04/15/19 1042 04/16/19 0324 04/17/19 0402 04/18/19 0429  NA 138 140 137 138 137  K 4.2 4.0 3.9 3.0* 3.2*  CL  --   --  103 94* 97*  CO2  --   --  25 30 27   GLUCOSE  --   --  115* 101* 104*  BUN  --   --  23 32* 25*  CREATININE  --   --  1.76* 1.57* 1.74*  CALCIUM  --   --  8.6* 9.4 9.3  MG  --   --   --   --  2.1    Liver Function Tests: No results for input(s): AST, ALT, ALKPHOS, BILITOT, PROT,  ALBUMIN in the last 168 hours. No results for input(s): LIPASE, AMYLASE in the last 168 hours. No results for input(s): AMMONIA in the last 168 hours.  CBC: Recent Labs  Lab 04/15/19 1037 04/15/19 1042 04/18/19 0429  WBC  --   --  5.6  HGB 11.9* 11.6* 11.6*  HCT 35.0* 34.0* 36.7  MCV  --   --  90.4  PLT  --   --  178    Cardiac Enzymes: No results for input(s): CKTOTAL, CKMB, CKMBINDEX, TROPONINI in the last 168 hours.  BNP: BNP (last 3 results) Recent Labs    08/18/18 1308 02/22/19 1400  BNP 334.9* 218.2*    ProBNP (last 3 results) No results for input(s): PROBNP in the last 8760 hours.    Other results:  Imaging: No results found.   Medications:     Scheduled Medications: . allopurinol  200 mg Oral QHS  . amiodarone  200 mg Oral BID  . apixaban  5 mg Oral BID  . atorvastatin  80 mg Oral Daily  . canagliflozin  100 mg Oral QAC breakfast  . Chlorhexidine Gluconate Cloth  6 each Topical Daily  . digoxin  0.0625 mg Oral QODAY  . insulin aspart  0-5 Units Subcutaneous QHS  . insulin aspart  0-9 Units Subcutaneous TID WC  . levothyroxine  25 mcg Oral Q0600  . losartan  12.5 mg Oral BID  . sodium chloride flush  3 mL Intravenous Q12H  . sodium chloride flush  3 mL Intravenous Q12H    Infusions: . sodium chloride    . sodium chloride      PRN Medications: sodium chloride, sodium chloride, acetaminophen, ondansetron (ZOFRAN) IV, sodium chloride flush, sodium chloride flush   Assessment/Plan :    1. Acute on Chronic systolic CHF: Ischemic cardiomyopathy s/p Medtronic CRT-D,  - Had LV lead revision in 9/19 and unable to place lead in CS so received HIS lead placement. Underwent lead revision 02/03/18 with cessation of HIS lead pacing and LV lead  - TEE 2/21: EF 20-25% RV moderately down severe MR/TR - CPX 2/20 peak VO2 15.3 (80 % predicted) VE/VCO2 slope 42  RER 1.10 - Much worse with recent AF. Now NYHA IIIB-IV - RHC 2/12:  RA 14 PA 66/28 PCWP 25 CI  2.2 - D/w Dr. Radene [facility]nee at [facility]. Has outpatient transplant eval Thur/Fri this week. -> Can do VAD here if not transplant candidate. Keloid ing has been a concern.  - Volume status stable. Restart lasix 80 mg daily tomorrow. Creatinine trending up 1.5>1.7.  - Intolerant entresto and carvedilol due to hypotension. Tried multiple times.  - Cut back losartan to 12.5 mg daily. Maps/SBP low.   -- Intolerant entresto and carvedilol due to hypotension. Tried multiple times.  [name] stopped as an outpatient with worsening renal function.  - Continue digoxin 0.0625 mg daily. Dig level 0.5 on 2/12  2. CAD:  - Stable on cath 1/19 - No chest pain.  - Continue ASA/statin  3. PAF  - very poorly tolerated.  -In NSR today. Continue amio 200 mg twice a day.  - Could not tolerate amio 400 bid due to GI side effects. - On Apixaban. No bleeding    4. Mitral regurgitation:  - S/p mitral valve annuloplasty with CABG in 2007. Moderate to severe MR on echo in 4/15. TEE (06/2013) mitral regurgitation appeared severe, anatomy of repaired valve does not look suitable for MV clipping and would be high risk for MV replacement.  - Severe by TEE 2/21  5. CKD stage III:  - Renal US - no hydronephrosis,. - Creatinine runs 1.6 -1.9 -Creatinine 1.7  - Follows with [facility]  6.  NSVT:  - Continue amiodarone - Keep K > 4.0 Mg >2.0 - TSH elelvated on 5/13. Started thyroid medications.  - No VT noted on ICD interrogation   7. Obesity - Body mass index is 30.9 kg/m.   7. PAD:  - Symptoms stable.   - Right mid SFA stenosis per [place] 08/30/14.    8. Hypothyroidism - TSH elevated 07/13/2017. PCP started synthroid. - Following with Endo.  - No change  Length of Stay: 3   Amy Clegg NP-C  04/18/2019, 9:09 AM  Advanced Heart Failure Team Pager [phone] (M-F; 7a - 4p)  Please contact [facility] for night-coverage after hours (4p -7a ) and weekends on [email or web address]  Patient seen and examined with  the above-signed Advanced Practice Provider and/or Housestaff. I personally reviewed laboratory data, imaging studies and relevant notes. I independently examined the patient and formulated the important aspects of the plan. I have edited the note to reflect any of my changes or salient points. I have personally discussed the plan with the patient  and/or family.  Volume status much improved. Swan numbers checked personally and then I d/c'd brachially swan and sheath myself. Creatinine relatively stable. Can go home today with f/u at [facility] on Thursday for transplant evaluation. Maintaining NSR on amio 200 bid (unable to tolerate 400 bid). I worry she is at high risk for recurrent AF in near future. Will get sleep study.   Glori Bickers, MD  10:17 AM

## 2019-04-18 NOTE — Plan of Care (Signed)
Care plan complete for pt discharge

## 2019-04-18 NOTE — Telephone Encounter (Signed)
hosp follow up information given to patient 04/26/19 @ 10 Pt aware and voiced understanding

## 2019-04-18 NOTE — Discharge Summary (Addendum)
Advanced Heart Failure Team  Discharge Summary   Patient ID: Robin [name] MRN: [id number], DOB/AGE: Jan 07, 1956 64 y.o. Admit date: 04/15/2019 D/C date:     04/18/2019   Primary Discharge Diagnoses:  1. Acute on Chronic systolic CHF 2. CAD s/p CABG 3. PAF  4. Mitral regurgitation:  5. CKD stage III:  6.  NSVT:   7. Obesity 8. PAD:  9. Hypothyroidism   [name] Course:  Robin [name] is a 64 y.o. female with history of HTN, DM2, past polysubstance abuse (ETOH, tobacco, cocaine), CAD s/p CABG x5 with MV annuloplasty (2007), ischemic cardiomyopathy s/p Medtronic CRT-D, chronic systolic HF EF 26-83%, CKD stage III and PAD. Blood type O+.   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 At time of cath LAD, LCX and RCA occluded. EF 45%. Had PCI of LAD followed by abrupt stent occlusion. Had repeat PCI of LAD and then underwent CABG x 5 with mitral valve annuloplasty in 2007.    Amitted 8/2-10/07/13 for syncope s/p ICD shock. Concern that VT was possibly from ischemia and taken for [name]. Started on Amiodarone. Cardiac output dropped while in [name] so started on milrinone. Discharged home. Underwent CRT-D upgrade on 12/02/13   In 12/15, she was admitted with catheter infection. She was admitted and catheter was replaced. Milrinone titrated off in January 2016 and has done reasonably well.    Currently undergoing heart transplant eval with Dr. Radene Knee at [name].    Underwent Generator change and lead revision 11/13/17. Goal was for HIS bundle pacing, but ended up with septal pacing. Was feeling bad after and saw Dr. Caryl Comes on 12/11/17.  Now s/p lead revision. HIS lead was capped and LV lead was incorporated into system.   Recently struggling with AF. Underwent DC-CV on 04/01/19 but went back to AF. Saw Dr. Caryl Comes on 04/06/19 and back in AF and feeling very poorly. Amio increased to 400 bid and scheduled for repeat DC-CV on 2/12. On arrival device  interrogation showed conversion to NSR last night. Felt terrible NYHA IIIB-IV. Unable to do ADLS without significant fatigue and SOB. No CP or edema. Thus taken for RHC cath which showed marked volume overload and moderately depressed cardiac output. RA 14 PA 66/28 PCWP 25 CI 2.2Worsening HF symptoms felt to be related to AF as well as higher dose of amiodarone.   Admitted from cath lab for IV diuresis. Ami cut back to 200 bid. She did not require inotropes. Diuresed with IV lasix and metolazone. Transitioned back to lasix 80 mg po daily + metolazone weekly. Losartan was cut back to due to elevated creatinine. Intolerant carvedilol, entresto, spiro due to hypotension and worsening renal function.  She will continue to be followed closely in the HF clinic. See below for detailed problem list.   1. Acute on Chronic systolic CHF: Ischemic cardiomyopathy s/p Medtronic CRT-D,  - Had LV lead revision in 9/19 and unable to place lead in CS so received HIS lead placement. Underwent lead revision 02/03/18 with cessation of HIS lead pacing and LV lead  - TEE 2/21: EF 20-25% RV moderately down severe MR/TR - CPX 2/20 peak VO2 15.3 (80 % predicted) VE/VCO2 slope 42  RER 1.10 - Much worse with recent AF. Now NYHA IIIB-IV - RHC 2/12:  RA 14 PA 66/28 PCWP 25 CI 2.2 - D/w Dr. Radene Knee at [facility]. Has outpatient transplant eval Thur/Fri this week. -> Can do VAD here if not transplant candidate.  Keloiding has been a concern.  - Volume status stable. Restart lasix 80 mg daily tomorrow. Creatinine trending up 1.5>1.7.  - Intolerant entresto and carvedilol due to hypotension. Tried multiple times.  - Cut back losartan to 12.5 mg daily. Maps/SBP low.   -- Intolerant entresto and carvedilol due to hypotension. Tried multiple times.  [name] stopped as an outpatient with worsening renal function.  - Continue digoxin 0.0625 mg daily. Dig level 0.5 on 2/12   2. CAD:  - Stable on cath 1/19 - No chest pain.  - Continue  ASA/statin   3. PAF  - very poorly tolerated.  -In NSR today. Continue amio 200 mg twice a day.  - Could not tolerate amio 400 bid due to GI side effects. - On Apixaban. No bleeding     4. Mitral regurgitation:  - S/p mitral valve annuloplasty with CABG in 2007. Moderate to severe MR on echo in 4/15. TEE (06/2013) mitral regurgitation appeared severe, anatomy of repaired valve does not look suitable for MV clipping and would be high risk for MV replacement.  - Severe by TEE 2/21   5. CKD stage III:  - Renal US - no hydronephrosis,. - Creatinine runs 1.6 -1.9 -Renal function was followed closely.  - Follows with [facility]   6.  NSVT:  - Continue amiodarone - Keep K > 4.0 Mg >2.0 - TSH elelvated on 5/13. Started thyroid medications.  - No VT noted on ICD interrogation    7. Obesity Body mass index is 30.88 kg/m.   7. PAD:  - Symptoms stable.   - Right mid SFA stenosis per [place] 08/30/14.     8. Hypothyroidism - TSH elevated 07/13/2017. PCP started synthroid. - Following with Endo.  - No change  Discharge Vitals: Blood pressure (!) 89/45, pulse (!) 58, temperature 98.4 F (36.9 C), temperature source Oral, resp. rate (!) 24, height '5\' 4"'$  (1.626 m), weight 81.6 kg, SpO2 (!) 89 %.  Labs: Lab Results  Component Value Date   WBC 5.6 04/18/2019   HGB 11.6 (L) 04/18/2019   HCT 36.7 04/18/2019   MCV 90.4 04/18/2019   PLT 178 04/18/2019    Recent Labs  Lab 04/18/19 0429  NA 137  K 3.2*  CL 97*  CO2 27  BUN 25*  CREATININE 1.74*  CALCIUM 9.3  GLUCOSE 104*   Lab Results  Component Value Date   CHOL 223 (H) 08/30/2014   HDL 36.40 (L) 06/07/2013   LDLCALC 82 06/07/2013   TRIG 106.0 06/07/2013   BNP (last 3 results) Recent Labs    08/18/18 1308 02/22/19 1400  BNP 334.9* 218.2*    ProBNP (last 3 results) No results for input(s): PROBNP in the last 8760 hours.   Diagnostic Studies/Procedures    [name] 04/15/19  RA = 14 RV = 67/13 PA = 66/28 (43) PCW =  25 (v-waves to 45) Fick cardiac output/index = 4.2/2.2 PVR = 4.3 WU Ao sat = 98% PA sat = 63%, 63% PAPi = 2.7 RA/PCWP = 0.56   Assessment: 1. Biventricular HF with elevated filling pressures and moderately depressed CO  Discharge Medications   Allergies as of 04/18/2019   No Known Allergies      Medication List     TAKE these medications    Accu-Chek FastClix Lancets Misc Use to test blood glucose 3 times daily. Dx:E11.9   Accu-Chek Nano SmartView w/Device Kit Use to test blood glucose 2 times daily. Dx:E11.9   Accu-Chek SmartView test  strip Generic drug: glucose blood Use to test blood glucose 2-3 times daily. Dx:E11.9. INSULIN DEPENDENT   acetaminophen 500 MG tablet Commonly known as: TYLENOL Take 1,000 mg by mouth every 6 (six) hours as needed for moderate pain or headache.   allopurinol 100 MG tablet Commonly known as: ZYLOPRIM Take 200 mg by mouth at bedtime.   amiodarone 200 MG tablet Commonly known as: PACERONE Take 1 tablet (200 mg total) by mouth 2 (two) times daily. What changed: how much to take   amoxicillin 500 MG capsule Commonly known as: AMOXIL Take 2,000 mg by mouth See admin instructions. Take 2000 mg 1 hour prior to dental work   apixaban 5 MG Tabs tablet Commonly known as: Eliquis Take 1 tablet (5 mg total) by mouth 2 (two) times daily.   atorvastatin 80 MG tablet Commonly known as: LIPITOR Take 80 mg by mouth daily.   digoxin 0.25 MG tablet Commonly known as: LANOXIN Take 0.0625 mg by mouth every other day.   furosemide 80 MG tablet Commonly known as: LASIX Take 80 mg by mouth daily. May take an additional 1/2 as needed   Insulin Pen Needle 32G X 4 MM Misc Use to inject insulin 1 time a day   Invokana 100 MG Tabs tablet Generic drug: canagliflozin Take 100 mg by mouth daily before breakfast.   levothyroxine 25 MCG tablet Commonly known as: SYNTHROID Take 1 tablet (25 mcg total) by mouth daily before breakfast.   losartan  25 MG tablet Commonly known as: COZAAR Take 0.5 tablets (12.5 mg total) by mouth daily. Start taking on: April 19, 2019 What changed: when to take this   Melatonin 10 MG Caps Take 10 mg by mouth at bedtime as needed (sleep).   metolazone 2.5 MG tablet Commonly known as: ZAROXOLYN Take 2.5 mg by mouth once a week. May take an additional tab ONLY as directed by CHF clinic   nitroGLYCERIN 0.4 MG SL tablet Commonly known as: NITROSTAT Place 0.4 mg under the tongue every 5 (five) minutes as needed for chest pain.   potassium chloride SA 20 MEQ tablet Commonly known as: KLOR-CON Take 1 tablet (20 mEq total) by mouth 2 (two) times daily.   Rybelsus 7 MG Tabs Generic drug: Semaglutide Take 7 mg by mouth daily.   Vitamin D3 125 MCG (5000 UT) Caps Take 5,000 Units by mouth daily.        Disposition   The patient will be discharged in stable condition to home. Discharge Instructions     (HEART FAILURE PATIENTS) Call MD:  Anytime you have any of the following symptoms: 1) 3 pound weight gain in 24 hours or 5 pounds in 1 week 2) shortness of breath, with or without a dry hacking cough 3) swelling in the hands, feet or stomach 4) if you have to sleep on extra pillows at night in order to breathe.   Complete by: As directed    Diet - low sodium heart healthy   Complete by: As directed    Increase activity slowly   Complete by: As directed            Duration of Discharge Encounter: Greater than 35 minutes   Signed, Amy Clegg NP-C  04/18/2019, 9:52 AM  Patient seen and examined with the above-signed Advanced Practice Provider and/or Housestaff. I personally reviewed laboratory data, imaging studies and relevant notes. I independently examined the patient and formulated the important aspects of the plan. I have edited the note to  reflect any of my changes or salient points. I have personally discussed the plan with the patient and/or family.  [place] for d/c. Wil have f/u with  [name] transplant team this week.   ,sig

## 2019-04-19 DIAGNOSIS — L910 Hypertrophic scar: Secondary | ICD-10-CM | POA: Diagnosis not present

## 2019-04-21 DIAGNOSIS — I255 Ischemic cardiomyopathy: Secondary | ICD-10-CM | POA: Diagnosis not present

## 2019-04-21 DIAGNOSIS — Z01810 Encounter for preprocedural cardiovascular examination: Secondary | ICD-10-CM | POA: Diagnosis not present

## 2019-04-22 ENCOUNTER — Other Ambulatory Visit (HOSPITAL_COMMUNITY): Payer: Self-pay | Admitting: Internal Medicine

## 2019-04-22 ENCOUNTER — Inpatient Hospital Stay (HOSPITAL_COMMUNITY): Admission: RE | Admit: 2019-04-22 | Payer: Medicare Other | Source: Ambulatory Visit | Admitting: Nurse Practitioner

## 2019-04-26 ENCOUNTER — Other Ambulatory Visit: Payer: Self-pay

## 2019-04-26 ENCOUNTER — Ambulatory Visit (HOSPITAL_BASED_OUTPATIENT_CLINIC_OR_DEPARTMENT_OTHER)
Admission: RE | Admit: 2019-04-26 | Discharge: 2019-04-26 | Disposition: A | Discharge status: Home / Self Care | Payer: Medicare Other | Source: Ambulatory Visit | Attending: Adult Health | Admitting: Adult Health

## 2019-04-26 VITALS — BP 106/70 | HR 99 | Wt 177.8 lb

## 2019-04-26 DIAGNOSIS — Z794 Long term (current) use of insulin: Secondary | ICD-10-CM | POA: Insufficient documentation

## 2019-04-26 DIAGNOSIS — Z8249 Family history of ischemic heart disease and other diseases of the circulatory system: Secondary | ICD-10-CM | POA: Insufficient documentation

## 2019-04-26 DIAGNOSIS — Z20822 Contact with and (suspected) exposure to covid-19: Secondary | ICD-10-CM | POA: Diagnosis not present

## 2019-04-26 DIAGNOSIS — E032 Hypothyroidism due to medicaments and other exogenous substances: Secondary | ICD-10-CM

## 2019-04-26 DIAGNOSIS — T462X1A Poisoning by other antidysrhythmic drugs, accidental (unintentional), initial encounter: Secondary | ICD-10-CM

## 2019-04-26 DIAGNOSIS — Z950 Presence of cardiac pacemaker: Secondary | ICD-10-CM | POA: Insufficient documentation

## 2019-04-26 DIAGNOSIS — I472 Ventricular tachycardia: Secondary | ICD-10-CM | POA: Diagnosis not present

## 2019-04-26 DIAGNOSIS — Z86718 Personal history of other venous thrombosis and embolism: Secondary | ICD-10-CM | POA: Insufficient documentation

## 2019-04-26 DIAGNOSIS — N1832 Chronic kidney disease, stage 3b: Secondary | ICD-10-CM | POA: Diagnosis not present

## 2019-04-26 DIAGNOSIS — R42 Dizziness and giddiness: Secondary | ICD-10-CM | POA: Insufficient documentation

## 2019-04-26 DIAGNOSIS — E1122 Type 2 diabetes mellitus with diabetic chronic kidney disease: Secondary | ICD-10-CM | POA: Diagnosis not present

## 2019-04-26 DIAGNOSIS — Z951 Presence of aortocoronary bypass graft: Secondary | ICD-10-CM | POA: Diagnosis not present

## 2019-04-26 DIAGNOSIS — I2510 Atherosclerotic heart disease of native coronary artery without angina pectoris: Secondary | ICD-10-CM | POA: Diagnosis not present

## 2019-04-26 DIAGNOSIS — N179 Acute kidney failure, unspecified: Secondary | ICD-10-CM | POA: Diagnosis not present

## 2019-04-26 DIAGNOSIS — I428 Other cardiomyopathies: Secondary | ICD-10-CM

## 2019-04-26 DIAGNOSIS — I272 Pulmonary hypertension, unspecified: Secondary | ICD-10-CM | POA: Insufficient documentation

## 2019-04-26 DIAGNOSIS — I340 Nonrheumatic mitral (valve) insufficiency: Secondary | ICD-10-CM | POA: Insufficient documentation

## 2019-04-26 DIAGNOSIS — Z95810 Presence of automatic (implantable) cardiac defibrillator: Secondary | ICD-10-CM | POA: Diagnosis not present

## 2019-04-26 DIAGNOSIS — R55 Syncope and collapse: Secondary | ICD-10-CM | POA: Diagnosis not present

## 2019-04-26 DIAGNOSIS — I130 Hypertensive heart and chronic kidney disease with heart failure and stage 1 through stage 4 chronic kidney disease, or unspecified chronic kidney disease: Secondary | ICD-10-CM | POA: Diagnosis not present

## 2019-04-26 DIAGNOSIS — I5023 Acute on chronic systolic (congestive) heart failure: Secondary | ICD-10-CM | POA: Diagnosis not present

## 2019-04-26 DIAGNOSIS — S0993XA Unspecified injury of face, initial encounter: Secondary | ICD-10-CM | POA: Diagnosis not present

## 2019-04-26 DIAGNOSIS — Z03818 Encounter for observation for suspected exposure to other biological agents ruled out: Secondary | ICD-10-CM | POA: Diagnosis not present

## 2019-04-26 DIAGNOSIS — I739 Peripheral vascular disease, unspecified: Secondary | ICD-10-CM | POA: Insufficient documentation

## 2019-04-26 DIAGNOSIS — Z79899 Other long term (current) drug therapy: Secondary | ICD-10-CM | POA: Insufficient documentation

## 2019-04-26 DIAGNOSIS — I480 Paroxysmal atrial fibrillation: Secondary | ICD-10-CM

## 2019-04-26 DIAGNOSIS — E1151 Type 2 diabetes mellitus with diabetic peripheral angiopathy without gangrene: Secondary | ICD-10-CM | POA: Diagnosis not present

## 2019-04-26 DIAGNOSIS — Z7901 Long term (current) use of anticoagulants: Secondary | ICD-10-CM | POA: Insufficient documentation

## 2019-04-26 DIAGNOSIS — E039 Hypothyroidism, unspecified: Secondary | ICD-10-CM | POA: Insufficient documentation

## 2019-04-26 DIAGNOSIS — N183 Chronic kidney disease, stage 3 unspecified: Secondary | ICD-10-CM | POA: Insufficient documentation

## 2019-04-26 DIAGNOSIS — Z6830 Body mass index (BMI) 30.0-30.9, adult: Secondary | ICD-10-CM | POA: Insufficient documentation

## 2019-04-26 DIAGNOSIS — I5022 Chronic systolic (congestive) heart failure: Secondary | ICD-10-CM

## 2019-04-26 DIAGNOSIS — E669 Obesity, unspecified: Secondary | ICD-10-CM | POA: Insufficient documentation

## 2019-04-26 DIAGNOSIS — R0600 Dyspnea, unspecified: Secondary | ICD-10-CM | POA: Insufficient documentation

## 2019-04-26 DIAGNOSIS — I255 Ischemic cardiomyopathy: Secondary | ICD-10-CM | POA: Diagnosis not present

## 2019-04-26 DIAGNOSIS — I252 Old myocardial infarction: Secondary | ICD-10-CM | POA: Insufficient documentation

## 2019-04-26 MED ORDER — POTASSIUM CHLORIDE CRYS ER 20 MEQ PO TBCR: 20.0000 meq | EXTENDED_RELEASE_TABLET | Freq: Two times a day (BID) | ORAL | 11 refills | Status: DC

## 2019-04-26 MED ORDER — AMIODARONE HCL 400 MG PO TABS: 400.0000 mg | ORAL_TABLET | Freq: Two times a day (BID) | ORAL | 3 refills | Status: DC

## 2019-04-26 MED ORDER — FUROSEMIDE 80 MG PO TABS: 80.0000 mg | ORAL_TABLET | Freq: Two times a day (BID) | ORAL | 3 refills | Status: DC

## 2019-04-26 NOTE — Progress Notes (Signed)
Patient ID: [name], female   DOB: 14-Feb-1956, 64 y.o.   MRN: [id number]    Advanced Heart Failure Clinic Note  PCP: Dr. Posey Pronto  Primary HF:  Dr. Haroldine Laws  EP: Dr Beaulah Dinning Transplant : Dr Radene Knee   HPI: [name] is a 64 y.o. female with history of HTN, DM2, past polysubstance abuse (ETOH, tobacco, cocaine), CAD s/p CABG x5 with MV annuloplasty (2007), ischemic cardiomyopathy s/p Medtronic CRT-D, chronic systolic HF EF 10-25%, CKD stage III and PAD. Blood type O+.  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 At time of cath LAD, LCX and RCA occluded. EF 45%. Had PCI of LAD followed by abrupt stent occlusion. Had repeat PCI of LAD and then underwent CABG x 5 with mitral valve annuloplasty in 2007.   Amitted 8/2-10/07/13 for syncope s/p ICD shock. Concern that VT was possibly from ischemia and taken for [facility]. Started on Amiodarone. Cardiac output dropped while in hospital so started on milrinone. Discharged home. Underwent CRT-D upgrade on 12/02/13  In 12/15, she was admitted with catheter infection. She was admitted and catheter was replaced. Milrinone titrated off in January 2016 and has done reasonably well.   Currently undergoing heart-kidney transplant eval with Dr. Radene Knee at [facility].   Underwent Generator change and lead revision 11/13/17. Goal was for HIS bundle pacing, but ended up with septal pacing. Was feeling bad after and saw Dr. Caryl Comes on 12/11/17.  Now s/p lead revision. HIS lead was capped and LV lead was incorporated into system.   Admitted 04/15/19 from cath lab. Diuresed with IV lasix then transitioned to lasix 80 mg twice a day. Discharge weight 179 pounds.   Last week she had follow up testing at [facility] for heart/kidney transplant. Had [place] as noted below. Final decision pending.   Today she returns for HF follow up.Overall feeling ok but on Saturday she had some dizziness. Mild dyspnea walking up steps. Denies  PND/Orthopnea. Appetite ok. No fever or chills. Weight at home 170-175  pounds. Taking all medications. Lives with her son.    ICD interrogation:  Back in A Fib. Impedance down. Activity 2 hours per day.   RHC at [facility]  RA (a/v/m) 14/14/13 RV 62/9 (RVEDP 14) PA 64/25 (m=40) PCW (a/v/m) 27/48/30 (large V wave) TPG 10 Diastolic pulmonary gradient (PAD-PCW) 5   Decreased cardiac output: Thermal Cardiac Output/Cardiac Index 3.73/2.0 l/min/m2 Fick Cardiac Output/Cardiac Index 4.1/2.2 l/min/m2, PA sat 59%. BP 109/57 (MAP 74), HR 57, Pox 96% PVR 2.4 (Fick) - 2.7 (TD) Wood units SVR 1190 (Fick) - 1301 (TD) PA 70/30 (m=44) after 40 ml normal saline used for thermodilution cardiac  output measurements  After 2 minutes of Exercise (leg lifts): (on room air)   Elevated filling pressures and pulmonary hypertension:  PA 88/39 (m=55) PCW (a/v/m) 41/60/42 TPG 13 PAD-PCW gradient 3   Normal cardiac output: Thermal Cardiac Output/Cardiac Index 5.10/2.74 l/min/m2 Fick Cardiac Output/Cardiac Index 3.6/1.9 l/min/m2, PA sat 56%. PVR 2.5 (TD) - 3.6 (Fick) Wood units BP 137/93, HR 63 (at peak exercise) Nitroprusside gtt challenge: Started at 0.05 mcg/kg/min (4 mcg/min) -  increased stepwise to a peak dose of 0.94 mcg/kg/min (75 mcg/min) (see  event log for details, 13:53-14:23): Nipride 50 mcg/min (0.63 mcg/kg/min) (at 14:11) PA 71/30 (m=45) BP 110/59, HR 57 (patient reporting no symptoms) Nipride 75 mcg/min (0.94 mcg/kg/min) (at 14:22) PA 45/20 (m=29)  PCW (a/v/m) 19/20/18 (notable, large V wave no longer present) RA (  a/v/m) 13/13/12 TPG 11  PAD-PCW gradient 2 Thermal Cardiac Output/Cardiac Index 3.98/2.14 l/min/m2 Fick Cardiac Output/Cardiac Index 6.06/3.26 l/min/m2, PA sat 65%.  PVR 1.8 (Fick) - 2.8 (TD) Wood units BP 89/40, HR 57 (lowest BP for ~2 minutes at end of infusion; patient  reporting aware of heart beat but no other symptoms) BP 96/52 - 117/62 after nitroprusside  discontinued.  RHC at [facility]  [facility] 03/10/2017  Ao = 99/59 (75) LV = 104/22 RA = 11 RV = 57/13 PA = 61/28 (39) PCW = 24 (v = 35-40) Fick cardiac output/index = 5.5/2.9 Thermo CO/CI = 6.9/3.7 PVR = 2.2 WU FA sat = 96% PA sat = 65%, 64% Assessment: 1. Severe 3v CAD 2. All 5 grafts open 3. Moderately elevated biventricular pressures with normal cardiac output. Prominent v-waves in PCW tracing c/w with significant MR  Echos: 05/19/2012  EF 15% 2/15 EF 15%, s/p MV repair with moderate-severe MR.  4/15: EF 15%, severe MR, mod TR 2/17 EF 20-25% 9/18 Echo EF 20-25% RV normal Mod MR   CPX (2/20): peak VO2 15.3 (80 % predicted) VE/VCO2 slope 42  RER 1.10 CPX (4/14): peak VO2 14.8 (68.5% predicted) VE/VCO2 slope 43  RER 1.12 CPX (3/15): peak VO2 15.6 (74.5% predicted) VE/VCO2 slope 41, RER 1.2 CPX (6/16): peak VO2:13.3 (68.6% predicted) VE/VCO2 slope:34  RER 1.12 CPX (6/17): peak VO2 13.3 (71.0% predicted) VE/VCO2 slope 41  RER 1.19   FH: Mother deceased: CAD, DM2, HTN        Father deceased: stroke  SH: Works odds/end jobs for [employer]; disabled. Lives in [place] with 2 sons([name] and [name]).   Review of systems complete and found to be negative unless listed in HPI.    Past Medical History:  Diagnosis Date  . AICD (automatic cardioverter/defibrillator) present   . Chronic systolic heart failure ([place])    a. ICM b. ECHO (06/2013): EF 15%, severe MR (06/2013) c. RHC (10/2013): RA 5, RV 23/2/5, PA 25/6 (14), PCWP 12, Fick CO/CI: 3.2/1.7, PVR 0.7 WU, PA 45%, 47% and 55% (with levophed 5), vagal response during cath. d. On home milrinone.  . CKD (chronic kidney disease), stage III   . Coronary artery disease    a. s/p CABG x 5 with MV annuloplasty 2007   . Diabetes mellitus   . DVT (deep venous thrombosis) ([place]) 2009  . Hypertension   . Implantable defibrillator   medtronic   . Ischemic cardiomyopathy    a. s/p ICD. b. LHC (10/04/13): 1. Severe native CAD with all bypass  grafts patent. c. CRT upgrade 12/2013.  [name] LBBB (left bundle branch block)   . Lipoma   . PAD (peripheral artery disease) ([place])   . PICC line infection    a. 01/2014 - PICC exchanged.  . Polysubstance abuse ([place])    history of  (cocaine, tobacco and ETOH)  . Presence of permanent cardiac pacemaker   . V-tach [facility])     Current Outpatient Medications  Medication Sig Dispense Refill  . ACCU-CHEK FASTCLIX LANCETS MISC Use to test blood glucose 3 times daily. Dx:E11.9 102 each 5  . acetaminophen (TYLENOL) 500 MG tablet Take 1,000 mg by mouth every 6 (six) hours as needed for moderate pain or headache.    . allopurinol (ZYLOPRIM) 100 MG tablet Take 200 mg by mouth at bedtime.    [name] amiodarone (PACERONE) 200 MG tablet Take 1 tablet (200 mg total) by mouth 2 (two) times daily. 60 tablet 6  .  amoxicillin (AMOXIL) 500 MG capsule Take 2,000 mg by mouth See admin instructions. Take 2000 mg 1 hour prior to dental work    . apixaban (ELIQUIS) 5 MG TABS tablet Take 1 tablet (5 mg total) by mouth 2 (two) times daily. 60 tablet 3  . atorvastatin (LIPITOR) 80 MG tablet Take 1 tablet by mouth once daily 90 tablet 3  . Blood Glucose Monitoring Suppl (ACCU-CHEK NANO SMARTVIEW) W/DEVICE KIT Use to test blood glucose 2 times daily. Dx:E11.9 1 kit 0  . canagliflozin (INVOKANA) 100 MG TABS tablet Take 100 mg by mouth daily before breakfast.    . Cholecalciferol (VITAMIN D3) 125 MCG (5000 UT) CAPS Take 5,000 Units by mouth daily.    . digoxin (LANOXIN) 0.25 MG tablet Take 0.0625 mg by mouth every other day.    . furosemide (LASIX) 80 MG tablet Take 80 mg by mouth 2 (two) times daily. May take an additional 1/2 as needed     . glucose blood (ACCU-CHEK SMARTVIEW) test strip Use to test blood glucose 2-3 times daily. Dx:E11.9. INSULIN DEPENDENT 100 each 2  . Insulin Pen Needle 32G X 4 MM MISC Use to inject insulin 1 time a day 100 each 3  . levothyroxine (SYNTHROID) 25 MCG tablet Take 1 tablet (25 mcg total) by mouth  daily before breakfast. 90 tablet 3  . losartan (COZAAR) 25 MG tablet Take 0.5 tablets (12.5 mg total) by mouth daily. 30 tablet 6  . Melatonin 10 MG CAPS Take 10 mg by mouth at bedtime as needed (sleep).    . metolazone (ZAROXOLYN) 2.5 MG tablet Take 2.5 mg by mouth 2 (two) times a week. May take an additional tab ONLY as directed by CHF clinic     . nitroGLYCERIN (NITROSTAT) 0.4 MG SL tablet Place 0.4 mg under the tongue every 5 (five) minutes as needed for chest pain.    . potassium chloride SA (K-DUR,KLOR-CON) 20 MEQ tablet Take 1 tablet (20 mEq total) by mouth 2 (two) times daily. 90 tablet 11  . Semaglutide (RYBELSUS) 7 MG TABS Take 7 mg by mouth daily.     No current facility-administered medications for this encounter.   Vitals:   04/26/19 0958  BP: 106/70  Pulse: 99  SpO2: 95%  Weight: 80.6 kg     Wt Readings from Last 3 Encounters:  04/26/19 80.6 kg  04/18/19 81.6 kg  04/06/19 83.7 kg     PHYSICAL EXAM: General:  Well appearing. No resp difficulty HEENT: normal Neck: supple. JVP flat.  Carotids 2+ bilat; no bruits. No lymphadenopathy or thryomegaly appreciated. Cor: PMI nondisplaced. Irregular rate & rhythm. No rubs, gallops or murmurs. Lungs: clear Abdomen: soft, nontender, nondistended. No hepatosplenomegaly. No bruits or masses. Good bowel sounds. Extremities: no cyanosis, clubbing, rash, edema Neuro: alert & orientedx3, cranial nerves grossly intact. moves all 4 extremities w/o difficulty. Affect pleasant  EKG: A fib 95 bpm   ASSESSMENT & PLAN:  1. Chronic systolic CHF: Ischemic cardiomyopathy s/p Medtronic CRT-D, EF 20-25% ( Echo 04/2015). - Had LV lead revision in 9/19 and unable to place lead in CS so received HIS lead placement.  - Echo 03/25/17 LVEF 20-25% (outside hospital/[facility]) with moderate MR. RV ok- Underwent lead revision 02/03/18 with cessation of HIS lead pacing and LV lead  - She follows in the transplant team. Waiting on decision. Had [place] with Dr.  Radene Knee last week.  She is blood type O - CPX 2/20 stable - RHC at [facility] with CO 3.7/CI  2, elevated PA pressures. Will try and start sildenafil after we SR restored.   -  NYHA III. BiV pacing down with A fib. Need to restore NSR.  - Continue lasix 80 mg twice a daily and metolazone twice a week.  - Intolerant entresto and carvedilol due to hypotension. Tried multiple times.  - Continue losartan 12.5 mg daily.  - [name] stopped with worsening renal function.  - Continue digoxin 0.0625 mg daily. - Recent labs from [facility] reviewed creatinine   2. CAD:  - Stable on cath 1/19 - no chest pain.  - Continue ASA/statin  3. Mitral regurgitation:  - S/p mitral valve annuloplasty with CABG in 2007. Moderate to severe MR on echo in 4/15. TEE (06/2013) mitral regurgitation appeared severe, anatomy of repaired valve does not look suitable for MV clipping and would be high risk for MV replacement.  - Moderate by Echo 03/2017.   4. CKD stage III:  - Renal US - no hydronephrosis,. - Follows with [facility]  5.  NSVT:  - Continue amiodarone.  - TSH elelvated on 5/13. Started thyroid medications.  - No VT noted on ICD interrogation   6. Obesity - Body mass index is 30.52 kg/m.   7. PAD:  - Symptoms stable.   - Right mid SFA stenosis per [place] 08/30/14.    8. Colon polyps - Had colonoscopy 06/08/14 with 12 polyps.  - Per PCP.   9. Hypothyroidism - TSH elevated 07/13/2017. Continue synthroid. - Following with Endo.  - No change  10. PAF  - Back in A fib today.  - Increase amio to 400 mg twice a day and set up follow up with Dr Caryl Comes. Recently had nausea on higher dose of amiodarone. She was instructed to call us ASAP if this happens.  - Continue eliquis 5 mg twice a day.  - EP follow up next week.   Follow up next week with Dr Caryl Comes and Dr Haroldine Laws. Dr Haroldine Laws updated Dr Radene Knee.   Darrick Grinder, NP  10:16 AM

## 2019-04-26 NOTE — Patient Instructions (Signed)
Lasix 80 mg twice a daily Be sure to take an additional 20 meq of potassium with every dose of metolazone INCREASE Amiodarone to 400 mg, one tab twice daily   Your physician recommends that you schedule a follow-up appointment in: 1 week with Dr Haroldine Laws and EKG   Do the following things EVERYDAY: 1) Weigh yourself in the morning before breakfast. Write it down and keep it in a log. 2) Take your medicines as prescribed 3) Eat low salt foods--Limit salt (sodium) to 2000 mg per day.  4) Stay as active as you can everyday 5) Limit all fluids for the day to less than 2 liters   At the [facility], you and your health needs are our priority. As part of our continuing mission to provide you with exceptional heart care, we have created designated Provider Care Teams. These Care Teams include your primary Cardiologist (physician) and Advanced Practice Providers (APPs- Physician Assistants and Nurse Practitioners) who all work together to provide you with the care you need, when you need it.   You may see any of the following providers on your designated Care Team at your next follow up: [name] Dr Glori Bickers . Dr Loralie Champagne . Darrick Grinder, NP . Lyda Jester, PA . Audry Riles, PharmD   Please be sure to bring in all your medications bottles to every appointment.

## 2019-04-27 ENCOUNTER — Encounter (HOSPITAL_COMMUNITY): Payer: Self-pay

## 2019-04-27 ENCOUNTER — Emergency Department (HOSPITAL_COMMUNITY): Payer: Medicare Other

## 2019-04-27 ENCOUNTER — Other Ambulatory Visit: Payer: Self-pay

## 2019-04-27 ENCOUNTER — Inpatient Hospital Stay (HOSPITAL_COMMUNITY)
Admission: EM | Admit: 2019-04-27 | Discharge: 2019-05-02 | DRG: 308 | Disposition: A | Discharge status: Other Acute Inpatient Hospital | Payer: Medicare Other | Attending: Cardiology | Admitting: Cardiology

## 2019-04-27 DIAGNOSIS — I5022 Chronic systolic (congestive) heart failure: Secondary | ICD-10-CM

## 2019-04-27 DIAGNOSIS — S0993XA Unspecified injury of face, initial encounter: Secondary | ICD-10-CM

## 2019-04-27 DIAGNOSIS — Z86718 Personal history of other venous thrombosis and embolism: Secondary | ICD-10-CM

## 2019-04-27 DIAGNOSIS — I272 Pulmonary hypertension, unspecified: Secondary | ICD-10-CM | POA: Diagnosis present

## 2019-04-27 DIAGNOSIS — I472 Ventricular tachycardia, unspecified: Secondary | ICD-10-CM

## 2019-04-27 DIAGNOSIS — E876 Hypokalemia: Secondary | ICD-10-CM | POA: Diagnosis present

## 2019-04-27 DIAGNOSIS — E669 Obesity, unspecified: Secondary | ICD-10-CM | POA: Diagnosis present

## 2019-04-27 DIAGNOSIS — I255 Ischemic cardiomyopathy: Secondary | ICD-10-CM | POA: Diagnosis not present

## 2019-04-27 DIAGNOSIS — Z7901 Long term (current) use of anticoagulants: Secondary | ICD-10-CM

## 2019-04-27 DIAGNOSIS — I509 Heart failure, unspecified: Secondary | ICD-10-CM | POA: Diagnosis not present

## 2019-04-27 DIAGNOSIS — I252 Old myocardial infarction: Secondary | ICD-10-CM

## 2019-04-27 DIAGNOSIS — I340 Nonrheumatic mitral (valve) insufficiency: Secondary | ICD-10-CM | POA: Diagnosis present

## 2019-04-27 DIAGNOSIS — J811 Chronic pulmonary edema: Secondary | ICD-10-CM | POA: Diagnosis not present

## 2019-04-27 DIAGNOSIS — Z95810 Presence of automatic (implantable) cardiac defibrillator: Secondary | ICD-10-CM | POA: Diagnosis not present

## 2019-04-27 DIAGNOSIS — I4819 Other persistent atrial fibrillation: Secondary | ICD-10-CM | POA: Diagnosis present

## 2019-04-27 DIAGNOSIS — N179 Acute kidney failure, unspecified: Secondary | ICD-10-CM | POA: Diagnosis present

## 2019-04-27 DIAGNOSIS — I5023 Acute on chronic systolic (congestive) heart failure: Secondary | ICD-10-CM | POA: Diagnosis present

## 2019-04-27 DIAGNOSIS — K7460 Unspecified cirrhosis of liver: Secondary | ICD-10-CM | POA: Diagnosis present

## 2019-04-27 DIAGNOSIS — R42 Dizziness and giddiness: Secondary | ICD-10-CM | POA: Diagnosis not present

## 2019-04-27 DIAGNOSIS — Z794 Long term (current) use of insulin: Secondary | ICD-10-CM | POA: Diagnosis not present

## 2019-04-27 DIAGNOSIS — Z951 Presence of aortocoronary bypass graft: Secondary | ICD-10-CM | POA: Diagnosis not present

## 2019-04-27 DIAGNOSIS — Z79890 Hormone replacement therapy (postmenopausal): Secondary | ICD-10-CM

## 2019-04-27 DIAGNOSIS — Z20822 Contact with and (suspected) exposure to covid-19: Secondary | ICD-10-CM | POA: Diagnosis present

## 2019-04-27 DIAGNOSIS — N1832 Chronic kidney disease, stage 3b: Secondary | ICD-10-CM

## 2019-04-27 DIAGNOSIS — I2510 Atherosclerotic heart disease of native coronary artery without angina pectoris: Secondary | ICD-10-CM | POA: Diagnosis present

## 2019-04-27 DIAGNOSIS — Z8249 Family history of ischemic heart disease and other diseases of the circulatory system: Secondary | ICD-10-CM

## 2019-04-27 DIAGNOSIS — E1151 Type 2 diabetes mellitus with diabetic peripheral angiopathy without gangrene: Secondary | ICD-10-CM | POA: Diagnosis not present

## 2019-04-27 DIAGNOSIS — Z87891 Personal history of nicotine dependence: Secondary | ICD-10-CM

## 2019-04-27 DIAGNOSIS — I499 Cardiac arrhythmia, unspecified: Secondary | ICD-10-CM | POA: Diagnosis not present

## 2019-04-27 DIAGNOSIS — I5082 Biventricular heart failure: Secondary | ICD-10-CM | POA: Diagnosis not present

## 2019-04-27 DIAGNOSIS — Z8674 Personal history of sudden cardiac arrest: Secondary | ICD-10-CM | POA: Diagnosis not present

## 2019-04-27 DIAGNOSIS — I447 Left bundle-branch block, unspecified: Secondary | ICD-10-CM | POA: Diagnosis present

## 2019-04-27 DIAGNOSIS — I130 Hypertensive heart and chronic kidney disease with heart failure and stage 1 through stage 4 chronic kidney disease, or unspecified chronic kidney disease: Secondary | ICD-10-CM | POA: Diagnosis present

## 2019-04-27 DIAGNOSIS — Z6830 Body mass index (BMI) 30.0-30.9, adult: Secondary | ICD-10-CM

## 2019-04-27 DIAGNOSIS — E785 Hyperlipidemia, unspecified: Secondary | ICD-10-CM | POA: Diagnosis present

## 2019-04-27 DIAGNOSIS — Z03818 Encounter for observation for suspected exposure to other biological agents ruled out: Secondary | ICD-10-CM | POA: Diagnosis not present

## 2019-04-27 DIAGNOSIS — I4891 Unspecified atrial fibrillation: Secondary | ICD-10-CM | POA: Diagnosis not present

## 2019-04-27 DIAGNOSIS — Z79899 Other long term (current) drug therapy: Secondary | ICD-10-CM | POA: Diagnosis not present

## 2019-04-27 DIAGNOSIS — R55 Syncope and collapse: Secondary | ICD-10-CM | POA: Diagnosis not present

## 2019-04-27 DIAGNOSIS — I517 Cardiomegaly: Secondary | ICD-10-CM | POA: Diagnosis not present

## 2019-04-27 DIAGNOSIS — E039 Hypothyroidism, unspecified: Secondary | ICD-10-CM | POA: Diagnosis present

## 2019-04-27 DIAGNOSIS — R079 Chest pain, unspecified: Secondary | ICD-10-CM | POA: Diagnosis not present

## 2019-04-27 DIAGNOSIS — R0789 Other chest pain: Secondary | ICD-10-CM | POA: Diagnosis not present

## 2019-04-27 DIAGNOSIS — E1122 Type 2 diabetes mellitus with diabetic chronic kidney disease: Secondary | ICD-10-CM | POA: Diagnosis present

## 2019-04-27 DIAGNOSIS — I480 Paroxysmal atrial fibrillation: Secondary | ICD-10-CM | POA: Diagnosis not present

## 2019-04-27 LAB — CBC WITH DIFFERENTIAL/PLATELET
Abs Immature Granulocytes: 0.02 10*3/uL (ref 0.00–0.07)
Basophils Absolute: 0.1 10*3/uL (ref 0.0–0.1)
Basophils Relative: 1 %
Eosinophils Absolute: 0.1 10*3/uL (ref 0.0–0.5)
Eosinophils Relative: 2 %
HCT: 40.3 % (ref 36.0–46.0)
Hemoglobin: 13.3 g/dL (ref 12.0–15.0)
Immature Granulocytes: 0 %
Lymphocytes Relative: 19 %
Lymphs Abs: 1.2 10*3/uL (ref 0.7–4.0)
MCH: 28.9 pg (ref 26.0–34.0)
MCHC: 33 g/dL (ref 30.0–36.0)
MCV: 87.6 fL (ref 80.0–100.0)
Monocytes Absolute: 0.4 10*3/uL (ref 0.1–1.0)
Monocytes Relative: 6 %
Neutro Abs: 4.5 10*3/uL (ref 1.7–7.7)
Neutrophils Relative %: 72 %
Platelets: 212 10*3/uL (ref 150–400)
RBC: 4.6 MIL/uL (ref 3.87–5.11)
RDW: 15.5 % (ref 11.5–15.5)
WBC: 6.2 10*3/uL (ref 4.0–10.5)
nRBC: 0 % (ref 0.0–0.2)

## 2019-04-27 LAB — BASIC METABOLIC PANEL
Anion gap: 16 — ABNORMAL HIGH (ref 5–15)
BUN: 50 mg/dL — ABNORMAL HIGH (ref 8–23)
CO2: 25 mmol/L (ref 22–32)
Calcium: 9.5 mg/dL (ref 8.9–10.3)
Chloride: 91 mmol/L — ABNORMAL LOW (ref 98–111)
Creatinine, Ser: 2.15 mg/dL — ABNORMAL HIGH (ref 0.44–1.00)
GFR calc Af Amer: 28 mL/min — ABNORMAL LOW (ref >60)
GFR calc non Af Amer: 24 mL/min — ABNORMAL LOW (ref >60)
Glucose, Bld: 185 mg/dL — ABNORMAL HIGH (ref 70–99)
Potassium: 2.5 mmol/L — CL (ref 3.5–5.1)
Sodium: 132 mmol/L — ABNORMAL LOW (ref 135–145)

## 2019-04-27 LAB — RESPIRATORY PANEL BY RT PCR (FLU A&B, COVID)
Influenza A by PCR: NEGATIVE
Influenza B by PCR: NEGATIVE
SARS Coronavirus 2 by RT PCR: NEGATIVE

## 2019-04-27 LAB — TROPONIN I (HIGH SENSITIVITY)
Troponin I (High Sensitivity): 29 ng/L — ABNORMAL HIGH (ref <18)
Troponin I (High Sensitivity): 26 ng/L — ABNORMAL HIGH (ref <18)

## 2019-04-27 LAB — PROTIME-INR
INR: 1.5 — ABNORMAL HIGH (ref 0.8–1.2)
Prothrombin Time: 18.4 seconds — ABNORMAL HIGH (ref 11.4–15.2)

## 2019-04-27 LAB — CBG MONITORING, ED: Glucose-Capillary: 180 mg/dL — ABNORMAL HIGH (ref 70–99)

## 2019-04-27 LAB — MAGNESIUM: Magnesium: 2.1 mg/dL (ref 1.7–2.4)

## 2019-04-27 MED ORDER — VITAMIN D 25 MCG (1000 UNIT) PO TABS
5000.0000 [IU] | ORAL_TABLET | Freq: Every day | ORAL | Status: DC
  Administered 2019-04-28 – 2019-05-02 (×5): 5000 [IU] via ORAL
  Filled 2019-04-27 (×5): qty 5

## 2019-04-27 MED ORDER — SODIUM CHLORIDE 0.9% FLUSH: 3.0000 mL | INTRAVENOUS | Status: DC | PRN

## 2019-04-27 MED ORDER — CHLORHEXIDINE GLUCONATE CLOTH 2 % EX PADS
6.0000 | MEDICATED_PAD | Freq: Every day | CUTANEOUS | Status: DC
  Administered 2019-04-28: 6 via TOPICAL

## 2019-04-27 MED ORDER — MELATONIN 3 MG PO TABS
3.0000 mg | ORAL_TABLET | Freq: Every evening | ORAL | Status: DC | PRN
  Administered 2019-04-28: 3 mg via ORAL
  Filled 2019-04-27 (×2): qty 1

## 2019-04-27 MED ORDER — APIXABAN 5 MG PO TABS
5.0000 mg | ORAL_TABLET | Freq: Two times a day (BID) | ORAL | Status: DC
  Administered 2019-04-28 – 2019-05-02 (×10): 5 mg via ORAL
  Filled 2019-04-27 (×10): qty 1

## 2019-04-27 MED ORDER — ONDANSETRON HCL 4 MG/2ML IJ SOLN
4.0000 mg | Freq: Once | INTRAMUSCULAR | Status: AC
  Administered 2019-04-27: 20:00:00 4 mg via INTRAVENOUS

## 2019-04-27 MED ORDER — LEVOTHYROXINE SODIUM 25 MCG PO TABS
25.0000 ug | ORAL_TABLET | Freq: Every day | ORAL | Status: DC
  Administered 2019-04-28 – 2019-04-30 (×3): 25 ug via ORAL
  Filled 2019-04-27 (×3): qty 1

## 2019-04-27 MED ORDER — AMIODARONE HCL IN DEXTROSE 360-4.14 MG/200ML-% IV SOLN
60.0000 mg/h | INTRAVENOUS | Status: AC
  Administered 2019-04-27 – 2019-04-28 (×2): 60 mg/h via INTRAVENOUS
  Filled 2019-04-27: qty 200

## 2019-04-27 MED ORDER — POTASSIUM CHLORIDE 10 MEQ/100ML IV SOLN
10.0000 meq | INTRAVENOUS | Status: AC
  Administered 2019-04-27 – 2019-04-28 (×4): 10 meq via INTRAVENOUS
  Filled 2019-04-27 (×4): qty 100

## 2019-04-27 MED ORDER — LOSARTAN POTASSIUM 25 MG PO TABS
12.5000 mg | ORAL_TABLET | Freq: Every day | ORAL | Status: DC
  Administered 2019-04-28 – 2019-05-02 (×5): 12.5 mg via ORAL
  Filled 2019-04-27 (×3): qty 1
  Filled 2019-04-27: qty 0.5
  Filled 2019-04-27: qty 1

## 2019-04-27 MED ORDER — INSULIN ASPART 100 UNIT/ML ~~LOC~~ SOLN
0.0000 [IU] | Freq: Three times a day (TID) | SUBCUTANEOUS | Status: DC
  Administered 2019-04-28: 08:00:00 2 [IU] via SUBCUTANEOUS
  Administered 2019-04-28 – 2019-04-29 (×3): 1 [IU] via SUBCUTANEOUS
  Administered 2019-04-29 – 2019-04-30 (×3): 2 [IU] via SUBCUTANEOUS
  Administered 2019-04-30: 07:00:00 1 [IU] via SUBCUTANEOUS
  Administered 2019-05-01 – 2019-05-02 (×3): 2 [IU] via SUBCUTANEOUS
  Administered 2019-05-02: 1 [IU] via SUBCUTANEOUS

## 2019-04-27 MED ORDER — SEMAGLUTIDE 7 MG PO TABS: 7.0000 mg | ORAL_TABLET | Freq: Every day | ORAL | Status: DC

## 2019-04-27 MED ORDER — SODIUM CHLORIDE 0.9% FLUSH
3.0000 mL | Freq: Two times a day (BID) | INTRAVENOUS | Status: DC
  Administered 2019-04-28 – 2019-05-02 (×8): 3 mL via INTRAVENOUS

## 2019-04-27 MED ORDER — SODIUM CHLORIDE 0.9 % IV SOLN
250.0000 mL | INTRAVENOUS | Status: DC | PRN
  Administered 2019-04-28: 04:00:00 250 mL via INTRAVENOUS

## 2019-04-27 MED ORDER — EPINEPHRINE 1 MG/10ML IJ SOSY
PREFILLED_SYRINGE | INTRAMUSCULAR | Status: AC | PRN
  Administered 2019-04-27: 1 mg via INTRAVENOUS

## 2019-04-27 MED ORDER — ATORVASTATIN CALCIUM 80 MG PO TABS
80.0000 mg | ORAL_TABLET | Freq: Every day | ORAL | Status: DC
  Administered 2019-04-28 – 2019-05-02 (×5): 80 mg via ORAL
  Filled 2019-04-27 (×3): qty 1
  Filled 2019-04-27: qty 2
  Filled 2019-04-27: qty 1

## 2019-04-27 MED ORDER — ACETAMINOPHEN 500 MG PO TABS
1000.0000 mg | ORAL_TABLET | Freq: Four times a day (QID) | ORAL | Status: DC | PRN
  Administered 2019-04-28 (×2): 1000 mg via ORAL
  Filled 2019-04-27 (×2): qty 2

## 2019-04-27 MED ORDER — AMIODARONE HCL 150 MG/3ML IV SOLN
INTRAVENOUS | Status: AC | PRN
  Administered 2019-04-27: 150 mg via INTRAVENOUS

## 2019-04-27 MED ORDER — POTASSIUM CHLORIDE CRYS ER 20 MEQ PO TBCR
40.0000 meq | EXTENDED_RELEASE_TABLET | ORAL | Status: AC
  Administered 2019-04-27 (×2): 40 meq via ORAL
  Filled 2019-04-27 (×2): qty 2

## 2019-04-27 MED ORDER — AMIODARONE HCL IN DEXTROSE 360-4.14 MG/200ML-% IV SOLN
30.0000 mg/h | INTRAVENOUS | Status: DC
  Administered 2019-04-28 – 2019-05-02 (×9): 30 mg/h via INTRAVENOUS
  Filled 2019-04-27 (×10): qty 200

## 2019-04-27 MED ORDER — DIGOXIN 125 MCG PO TABS
0.0625 mg | ORAL_TABLET | ORAL | Status: DC
  Administered 2019-04-29 – 2019-05-01 (×2): 0.0625 mg via ORAL
  Filled 2019-04-27 (×4): qty 1

## 2019-04-27 MED ORDER — ALLOPURINOL 100 MG PO TABS
200.0000 mg | ORAL_TABLET | Freq: Every day | ORAL | Status: DC
  Administered 2019-04-28 – 2019-05-01 (×5): 200 mg via ORAL
  Filled 2019-04-27 (×6): qty 2

## 2019-04-27 MED ORDER — POTASSIUM CHLORIDE 10 MEQ/100ML IV SOLN: 10.0000 meq | INTRAVENOUS | Status: DC

## 2019-04-27 NOTE — ED Notes (Signed)
Date and time results received: 04/27/19  (use smartphrase ".now" to insert current time)  Test: Potassium Critical Value: 2.4  Name of Provider Notified: [place] Orders Received? Or Actions Taken?:

## 2019-04-27 NOTE — H&P (Addendum)
Cardiology Admission History and Physical:   Patient ID: [name] MRN: [id number]; DOB: 1955/07/20   Admission date: 04/27/2019  Primary Care Provider: Mitzi Hansen, MD Primary Cardiologist: Glori Bickers, MD  Primary Electrophysiologist:  None   Chief Complaint:  Presyncope  Patient Profile:   [name] is a 64 y.o. female with  female history of HTN, DM2, CAD with 5 vessel CABG and MV annuloplasty in 2007, CRT-D, chronic sysotlic HF 98-92%, CKD 3, PAD. Few years ago was on home inotropes eventually weaned off. Followed by CHF team at [facility], also at [facility] for considerations for heart/kidney transplant. Presented with presyncope.  History of Present Illness:   [name] 64 yo female history of HTN, DM2, CAD with 5 vessel CABG and MV annuloplasty in 2007, CRT-D, chronic sysotlic HF 11-94%, CKD 3, PAD. Few years ago was on home inotropes eventually weaned off. Followed by CHF team at [facility], also at [facility] for considerations for heart/kidney transplant.  04/2019 admit for diuresis, discharge weight 179 lbs.     Presented with episodes of presyncope for the last few days, initial episode on Sunday. Sudden onset of lightheadness/dizziness, no specific palpitations or chest pain. Did not lose consciuness at home. Seen in CHF clnic yesterday, device check showed recurrent afib without significant other arrhythmias  In ER she had a recurrent episode. Telemetry showed ventricular tachycardia. Initially she was stable, given IV amio '150mg'$ . Transiently loss consciusness, immediateld synchronized cardioverted back to afib. Complete resoluation of symptoms, was not intubated. Very brief CPR. Currently sitting comfortably talking to family on the phone.    RHC at [facility]  RA (a/v/m) 14/14/13 RV 62/9 (RVEDP 14) PA 64/25 (m=40) PCW (a/v/m) 27/48/30 (large V wave) TPG 10 Diastolic pulmonary gradient (PAD-PCW) 5   Decreased cardiac output: Thermal Cardiac  Output/Cardiac Index 3.73/2.0 l/min/m2 Fick Cardiac Output/Cardiac Index 4.1/2.2 l/min/m2, PA sat 59%. BP 109/57 (MAP 74), HR 57, Pox 96% PVR 2.4 (Fick) - 2.7 (TD) Wood units SVR 1190 (Fick) - 1301 (TD) PA 70/30 (m=44) after 40 ml normal saline used for thermodilution cardiac  output measurements  After 2 minutes of Exercise (leg lifts): (on room air)   Elevated filling pressures and pulmonary hypertension:  PA 88/39 (m=55) PCW (a/v/m) 41/60/42 TPG 13 PAD-PCW gradient 3   Normal cardiac output: Thermal Cardiac Output/Cardiac Index 5.10/2.74 l/min/m2 Fick Cardiac Output/Cardiac Index 3.6/1.9 l/min/m2, PA sat 56%. PVR 2.5 (TD) - 3.6 (Fick) Wood units BP 137/93, HR 63 (at peak exercise) Nitroprusside gtt challenge: Started at 0.05 mcg/kg/min (4 mcg/min) -  increased stepwise to a peak dose of 0.94 mcg/kg/min (75 mcg/min) (see  event log for details, 13:53-14:23): Nipride 50 mcg/min (0.63 mcg/kg/min) (at 14:11) PA 71/30 (m=45) BP 110/59, HR 57 (patient reporting no symptoms) Nipride 75 mcg/min (0.94 mcg/kg/min) (at 14:22) PA 45/20 (m=29)  PCW (a/v/m) 19/20/18 (notable, large V wave no longer present) RA (a/v/m) 13/13/12 TPG 11  PAD-PCW gradient 2 Thermal Cardiac Output/Cardiac Index 3.98/2.14 l/min/m2 Fick Cardiac Output/Cardiac Index 6.06/3.26 l/min/m2, PA sat 65%.  PVR 1.8 (Fick) - 2.8 (TD) Wood units BP 89/40, HR 57 (lowest BP for ~2 minutes at end of infusion; patient  reporting aware of heart beat but no other symptoms) BP 96/52 - 117/62 after nitroprusside discontinued.  RHC at [facility]  [facility] 03/10/2017  Ao = 99/59 (75) LV = 104/22 RA = 11 RV = 57/13 PA = 61/28 (39) PCW = 24 (v = 35-40) Fick cardiac output/index = 5.5/2.9 Thermo CO/CI =  6.9/3.7 PVR = 2.2 WU FA sat = 96% PA sat = 65%, 64% Assessment: 1. Severe 3v CAD 2. All 5 grafts open 3. Moderately elevated biventricular pressures with normal cardiac output. Prominent v-waves in PCW tracing c/w with  significant MR  Echos: 05/19/2012  EF 15% 2/15 EF 15%, s/p MV repair with moderate-severe MR.  4/15: EF 15%, severe MR, mod TR 2/17 EF 20-25% 9/18 Echo EF 20-25% RV normal Mod MR    Hgb 13.3 Plt 212 INR 1.5 WBC 6.2  hstrop pending CXR cardiomegaly, diffuse pulmmonary interstitial congestion  04/26/19 echo [facility]: LVEF 20-25%, severe MR, mod RV dysfunction, mod to severe pulm HTN 04/2019 RHC: mean PA 43, PCWP 25, CI 2.04 Mar 2017 LHC/RHC: prox RCA 100%, distal RCA 80%, prox LAD 100%, D1 80%, prox LCX 100%. All 5 grafts patent.    Heart Pathway Score:     Past Medical History:  Diagnosis Date  . AICD (automatic cardioverter/defibrillator) present   . Chronic systolic heart failure ([place])    a. ICM b. ECHO (06/2013): EF 15%, severe MR (06/2013) c. RHC (10/2013): RA 5, RV 23/2/5, PA 25/6 (14), PCWP 12, Fick CO/CI: 3.2/1.7, PVR 0.7 WU, PA 45%, 47% and 55% (with levophed 5), vagal response during cath. d. On home milrinone.  . CKD (chronic kidney disease), stage III   . Coronary artery disease    a. s/p CABG x 5 with MV annuloplasty 2007   . Diabetes mellitus   . DVT (deep venous thrombosis) ([place]) 2009  . Hypertension   . Implantable defibrillator   medtronic   . Ischemic cardiomyopathy    a. s/p ICD. b. LHC (10/04/13): 1. Severe native CAD with all bypass grafts patent. c. CRT upgrade 12/2013.  [name] LBBB (left bundle Orel Hord block)   . Lipoma   . PAD (peripheral artery disease) ([place])   . PICC line infection    a. 01/2014 - PICC exchanged.  . Polysubstance abuse ([place])    history of  (cocaine, tobacco and ETOH)  . Presence of permanent cardiac pacemaker   . V-tach [facility])     Past Surgical History:  Procedure Laterality Date  . ANKLE FRACTURE SURGERY Right    plate inserted  . ANKLE SURGERY Right    plate removed  . BI-VENTRICULAR IMPLANTABLE CARDIOVERTER DEFIBRILLATOR UPGRADE  12/02/2013   [facility] [place] CRTD upgrade by Dr Caryl Comes  . BI-VENTRICULAR IMPLANTABLE CARDIOVERTER DEFIBRILLATOR UPGRADE  N/A 12/02/2013   Procedure: BI-VENTRICULAR IMPLANTABLE CARDIOVERTER DEFIBRILLATOR UPGRADE;  Surgeon: Deboraha Sprang, MD;  Location: [facility] CATH LAB;  Service: Cardiovascular;  Laterality: N/A;  . CARDIAC CATHETERIZATION    . CARDIAC DEFIBRILLATOR PLACEMENT     2011, Followed By Dr. Caryl Comes  . CARDIOVERSION N/A 04/01/2019   Procedure: CARDIOVERSION;  Surgeon: Jolaine Artist, MD;  Location: [place];  Service: Cardiovascular;  Laterality: N/A;  . COLONOSCOPY WITH PROPOFOL N/A 06/08/2014   Procedure: COLONOSCOPY WITH PROPOFOL;  Surgeon: Wilford Corner, MD;  Location: [facility] ENDOSCOPY;  Service: Endoscopy;  Laterality: N/A;  . COLONOSCOPY WITH PROPOFOL N/A 06/17/2017   Procedure: COLONOSCOPY WITH PROPOFOL;  Surgeon: Wilford Corner, MD;  Location: [place];  Service: Endoscopy;  Laterality: N/A;  . CORONARY ARTERY BYPASS GRAFT  10/2005  . ICD GENERATOR CHANGEOUT N/A 11/13/2017   Procedure: ICD GENERATOR CHANGEOUT;  Surgeon: Deboraha Sprang, MD;  Location: [place] CV LAB;  Service: Cardiovascular;  Laterality: N/A;  . LEAD REVISION/REPAIR N/A 11/13/2017   Procedure: LEAD REVISION/REPAIR;  Surgeon: Deboraha Sprang, MD;  Location: [place] CV LAB;  Service: Cardiovascular;  Laterality: N/A;  . LEAD REVISION/REPAIR N/A 02/03/2018   Procedure: LEAD REVISION/REPAIR;  Surgeon: Deboraha Sprang, MD;  Location: [place] CV LAB;  Service: Cardiovascular;  Laterality: N/A;  . LEFT AND RIGHT HEART CATHETERIZATION WITH CORONARY/GRAFT ANGIOGRAM N/A 03/17/2011   Procedure: LEFT AND RIGHT HEART CATHETERIZATION WITH Beatrix Fetters;  Surgeon: Jolaine Artist, MD;  Location: [facility] CATH LAB;  Service: Cardiovascular;  Laterality: N/A;  . LEFT AND RIGHT HEART CATHETERIZATION WITH CORONARY/GRAFT ANGIOGRAM N/A 10/04/2013   Procedure: LEFT AND RIGHT HEART CATHETERIZATION WITH Beatrix Fetters;  Surgeon: Jolaine Artist, MD;  Location: [facility] CATH LAB;  Service: Cardiovascular;  Laterality: N/A;  .  RIGHT HEART CATH N/A 04/15/2019   Procedure: RIGHT HEART CATH;  Surgeon: Jolaine Artist, MD;  Location: [place] CV LAB;  Service: Cardiovascular;  Laterality: N/A;  . RIGHT HEART CATHETERIZATION N/A 05/23/2013   Procedure: RIGHT HEART CATH;  Surgeon: Jolaine Artist, MD;  Location: [facility] CATH LAB;  Service: Cardiovascular;  Laterality: N/A;  . RIGHT HEART CATHETERIZATION N/A 11/01/2013   Procedure: RIGHT HEART CATH;  Surgeon: Jolaine Artist, MD;  Location: [facility] CATH LAB;  Service: Cardiovascular;  Laterality: N/A;  . RIGHT HEART CATHETERIZATION N/A 11/25/2013   Procedure: RIGHT HEART CATH;  Surgeon: Jolaine Artist, MD;  Location: [facility] CATH LAB;  Service: Cardiovascular;  Laterality: N/A;  . RIGHT/LEFT HEART CATH AND CORONARY/GRAFT ANGIOGRAPHY N/A 03/10/2017   Procedure: RIGHT/LEFT HEART CATH AND CORONARY/GRAFT ANGIOGRAPHY;  Surgeon: Jolaine Artist, MD;  Location: [place] CV LAB;  Service: Cardiovascular;  Laterality: N/A;  . TEE WITHOUT CARDIOVERSION N/A 06/02/2013   Procedure: TRANSESOPHAGEAL ECHOCARDIOGRAM (TEE);  Surgeon: Larey Dresser, MD;  Location: [place];  Service: Cardiovascular;  Laterality: N/A;  . TEE WITHOUT CARDIOVERSION N/A 04/01/2019   Procedure: TRANSESOPHAGEAL ECHOCARDIOGRAM (TEE);  Surgeon: Jolaine Artist, MD;  Location: [facility] ENDOSCOPY;  Service: Cardiovascular;  Laterality: N/A;     Medications Prior to Admission: Prior to Admission medications   Medication Sig Start Date End Date Taking? Authorizing Provider  ACCU-CHEK FASTCLIX LANCETS MISC Use to test blood glucose 3 times daily. Dx:E11.9 08/26/17   Lucious Groves, DO  acetaminophen (TYLENOL) 500 MG tablet Take 1,000 mg by mouth every 6 (six) hours as needed for moderate pain or headache.    [provider]  allopurinol (ZYLOPRIM) 100 MG tablet Take 200 mg by mouth at bedtime.    [provider]  amiodarone (PACERONE) 400 MG tablet Take 1 tablet (400 mg total) by mouth 2 (two) times  daily. 04/26/19   Clegg, Amy D, NP  amoxicillin (AMOXIL) 500 MG capsule Take 2,000 mg by mouth See admin instructions. Take 2000 mg 1 hour prior to dental work 02/09/19   [provider]  apixaban (ELIQUIS) 5 MG TABS tablet Take 1 tablet (5 mg total) by mouth 2 (two) times daily. 03/28/19   Sherran Needs, NP  atorvastatin (LIPITOR) 80 MG tablet Take 1 tablet by mouth once daily 04/22/19   Bensimhon, Shaune Pascal, MD  Blood Glucose Monitoring Suppl (ACCU-CHEK NANO SMARTVIEW) W/DEVICE KIT Use to test blood glucose 2 times daily. Dx:E11.9 10/18/14   Lenore Cordia, MD  canagliflozin (INVOKANA) 100 MG TABS tablet Take 100 mg by mouth daily before breakfast.    [provider]  Cholecalciferol (VITAMIN D3) 125 MCG (5000 UT) CAPS Take 5,000 Units by mouth daily.    [provider]  digoxin Fonnie Birkenhead)  0.25 MG tablet Take 0.0625 mg by mouth every other day.    [provider]  furosemide (LASIX) 80 MG tablet Take 1 tablet (80 mg total) by mouth 2 (two) times daily. May take an additional 1/2 as needed 04/26/19   Clegg, Amy D, NP  glucose blood (ACCU-CHEK SMARTVIEW) test strip Use to test blood glucose 2-3 times daily. Dx:E11.9. INSULIN DEPENDENT 03/15/19   Ina Homes, MD  Insulin Pen Needle 32G X 4 MM MISC Use to inject insulin 1 time a day 08/26/17   Lucious Groves, DO  levothyroxine (SYNTHROID) 25 MCG tablet Take 1 tablet (25 mcg total) by mouth daily before breakfast. 10/26/18   Deboraha Sprang, MD  losartan (COZAAR) 25 MG tablet Take 0.5 tablets (12.5 mg total) by mouth daily. 04/19/19   Clegg, Amy D, NP  Melatonin 10 MG CAPS Take 10 mg by mouth at bedtime as needed (sleep).    [provider]  metolazone (ZAROXOLYN) 2.5 MG tablet Take 2.5 mg by mouth 2 (two) times a week. May take an additional tab ONLY as directed by CHF clinic     [provider]  nitroGLYCERIN (NITROSTAT) 0.4 MG SL tablet Place 0.4 mg under the tongue every 5 (five) minutes as needed  for chest pain.    [provider]  potassium chloride SA (KLOR-CON) 20 MEQ tablet Take 1 tablet (20 mEq total) by mouth 2 (two) times daily. With an additional 20 MEQ every Metolazone 04/26/19   Clegg, Amy D, NP  Semaglutide (RYBELSUS) 7 MG TABS Take 7 mg by mouth daily.    [provider]     Allergies:   No Known Allergies  Social History:   Social History   Socioeconomic History  . Marital status: Widowed    Spouse name: Not on file  . Number of children: Not on file  . Years of education: Not on file  . Highest education level: Not on file  Occupational History  . Occupation: disable    Employer: UNEMPLOYED  Tobacco Use  . Smoking status: Former Smoker    Years: 32.00    Types: Cigarettes    Quit date: 03/03/2011    Years since quitting: 8.1  . Smokeless tobacco: Never Used  . Tobacco comment: light smoker  Substance and Sexual Activity  . Alcohol use: No    Alcohol/week: 0.0 standard drinks  . Drug use: No    Comment: Has history of cocaine use, denies recent  . Sexual activity: Not on file  Other Topics Concern  . Not on file  Social History Narrative  . Not on file   Social Determinants of Health   Financial Resource Strain:   . Difficulty of Paying Living Expenses: Not on file  Food Insecurity:   . Worried About Charity fundraiser in the Last Year: Not on file  . Ran Out of Food in the Last Year: Not on file  Transportation Needs:   . Lack of Transportation (Medical): Not on file  . Lack of Transportation (Non-Medical): Not on file  Physical Activity:   . Days of Exercise per Week: Not on file  . Minutes of Exercise per Session: Not on file  Stress:   . Feeling of Stress : Not on file  Social Connections:   . Frequency of Communication with Friends and Family: Not on file  . Frequency of Social Gatherings with Friends and Family: Not on file  . Attends Religious Services: Not on file  .  Active Member of Clubs or Organizations: Not on  file  . Attends Archivist Meetings: Not on file  . Marital Status: Not on file  Intimate Partner Violence:   . Fear of Current or Ex-Partner: Not on file  . Emotionally Abused: Not on file  . Physically Abused: Not on file  . Sexually Abused: Not on file    Family History:   The patient's family history includes CVA in her father; Diabetes in her maternal grandmother; Heart attack in her cousin, father, and maternal uncle; Heart attack (age of onset: 28) in her mother.    ROS:  Please see the history of present illness.  All other ROS reviewed and negative.     Physical Exam/Data:   Vitals:   04/27/19 1828 04/27/19 1838 04/27/19 1852 04/27/19 1945  BP:  105/70  (!) 113/100  Pulse:  73 78   Resp:  16 20 (!) 22  Temp:  98.3 F (36.8 C)    TempSrc:  Oral    SpO2: 98% 96% 93%    No intake or output data in the 24 hours ending 04/27/19 2009 Last 3 Weights 04/26/2019 04/18/2019 04/17/2019  Weight (lbs) 177 lb 12.8 oz 179 lb 14.3 oz 178 lb 5.6 oz  Weight (kg) 80.65 kg 81.6 kg 80.9 kg     There is no height or weight on file to calculate BMI.  General:  Well nourished, well developed, in no acute distress HEENT: normal Lymph: no adenopathy Neck: mildly elevated JVD Endocrine:  No thryomegaly Vascular: No carotid bruits; FA pulses 2+ bilaterally without bruits  Cardiac:  Irreg, 3/6 systolic murmur apex Lungs:  clear to auscultation bilaterally, no wheezing, rhonchi or rales  Abd: soft, nontender, no hepatomegaly  Ext: no LE edema Musculoskeletal:  No deformities, BUE and BLE strength normal and equal Skin: warm and dry  Neuro:  CNs 2-12 intact, no focal abnormalities noted Psych:  Normal affect      Laboratory Data:  High Sensitivity Troponin:  No results for input(s): TROPONINIHS in the last 720 hours.    ChemistryNo results for input(s): NA, K, CL, CO2, GLUCOSE, BUN, CREATININE, CALCIUM, GFRNONAA, GFRAA, ANIONGAP in the last 168 hours.  No results for  input(s): PROT, ALBUMIN, AST, ALT, ALKPHOS, BILITOT in the last 168 hours. Hematology Recent Labs  Lab 04/27/19 1907  WBC 6.2  RBC 4.60  HGB 13.3  HCT 40.3  MCV 87.6  MCH 28.9  MCHC 33.0  RDW 15.5  PLT 212   BNPNo results for input(s): BNP, PROBNP in the last 168 hours.  DDimer No results for input(s): DDIMER in the last 168 hours.   Radiology/Studies:  DG Chest Port 1 View  Result Date: 04/27/2019 CLINICAL DATA:  Initial evaluation for acute weakness, near syncope. EXAM: PORTABLE CHEST 1 VIEW COMPARISON:  Prior radiograph from 12/11/2017. FINDINGS: Left-sided transvenous pacemaker/AICD in place. Median sternotomy wires with underlying CABG markers and valvular prosthesis noted, stable. Cardiomegaly grossly stable allowing for differences in technique. Mediastinal silhouette within normal limits. Lungs are hypoinflated. Perihilar vascular and interstitial prominence suggest mild pulmonary interstitial congestion. No visible pleural effusion. No focal infiltrates or consolidative opacity. No pneumothorax. No acute osseous finding. IMPRESSION: 1. Cardiomegaly with mild diffuse pulmonary interstitial congestion/edema. 2. No other active cardiopulmonary disease. Electronically Signed   By: Jeannine Boga M.D.   On: 04/27/2019 19:36   {   Assessment and Plan:   1. Ventricular tachycardia - presyncopal episodes over the last several days. In CHF  clinic device check with afib.  - VT in ER with very brief LOC and CPR, immediately cardioverted with rapid resolution of symptoms.  - labs are pending, including K and mg. No recent infectoius symptoms. She denies any anginal like symptoms. Reports medication compliance.  - history of NSVT, has been on amio at home.   - boluses amio 150 in ER, synchronized cardioverted now on amio gtt in afib with no ongoing symptoms.  - I suspect VT related to her chronic severe systolic dysfunction and severe hypokalemia. F/u labs, trops. She just had  an echo at [facility] yesterday on care everywhere, will not repeat. Cath 2019 with patent grafts, has not had anginal symptoms. EKG paced cannot interpret for ischemia, f/u trops though should have some elevation given her event.  - continue amiodarone gtt, will need EP device recheck in AM and CHF team to see in AM. Does not appear her device deliverted therapies.     2. Chronic biventricular  HF - followed by [facility] HF team and [facility], undergoing possible transplant eval. From last admit note if note transplant candidate could consider VAD.  -Intolerant entresto and carvedilol due to hypotension. [name] stopped with worsening renal function. Just on low dose losartan.  - s/p Medtronic CRT-D, - Had LV lead revision in 9/19 and unable to place lead in CS so received HIS lead placement. Underwent lead revision 02/03/18 with cessation of HIS lead pacing and LV lead  - I think mild volume overload on exam. She is asymptomatic. WIth AKI and severe low K hold lasix and metolazone for now.    3. Severe MR - noted 04/2019 [facility] echo severe MR - TEE 2/21: EF 20-25% RV moderately down severe MR/TR - from CHF clinic notes not amenable to MV clip, high risk for MVR  4. Pulmonary HTN  5. CAD - cath 2019 with patent grafts x 5.  - no recent anginal symptoms - EKG paced, cannot determine ischemia. Follow trops, expect some elevation with her VT event.  - I think VT more likely related to her chronic pump failure and severe hypokalemia as opposed to acute ischemia.   6. CKD 3 - Cr above baseline on admit. Monitor, would conitue her home diuretic at this time.   7. PAF - has been on eliquis - on amiodarone  8. Hypokalemia - aggressive repletion. Mg is normal   Severity of Illness: The appropriate patient status for this patient is INPATIENT. Inpatient status is judged to be reasonable and necessary in order to provide the required intensity of service to ensure the patient's safety. The patient's presenting  symptoms, physical exam findings, and initial radiographic and laboratory data in the context of their chronic comorbidities is felt to place them at high risk for further clinical deterioration. Furthermore, it is not anticipated that the patient will be medically stable for discharge from the hospital within 2 midnights of admission. The following factors support the patient status of inpatient.   " The patient's presenting symptoms include syncope " The worrisome physical exam findings include n/a " The initial radiographic and laboratory data are worrisome because of ventricular tachcyardia. " The chronic co-morbidities include chronic systolic HF   * I certify that at the point of admission it is my clinical judgment that the patient will require inpatient hospital care spanning beyond 2 midnights from the point of admission due to high intensity of service, high risk for further deterioration and high frequency of surveillance required.*  For questions or updates, please contact Media Please consult [email or web address] for contact info under        Signed, Carlyle Dolly, MD  04/27/2019 8:09 PM

## 2019-04-27 NOTE — ED Triage Notes (Signed)
Per [facility]: Pt has a "demand" pacemaker, pt does not feel that it is working. Initally called out for chest pain, generalized weakness, and feels like previous A. Fib episodes.  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 EMS attempted IV X 2 without success. EMS states that the pacemaker did not appear to be working correctly on their monitor. Pt is diabetic, not on insulin

## 2019-04-27 NOTE — Consult Note (Signed)
Advanced Heart Failure Team Consult Note   Primary Physician: Mitzi Hansen, MD PCP-Cardiologist:  Glori Bickers, MD  Reason for Consultation: VT/HF  HPI:    [name] is seen today for evaluation of VT and advanced HF at the request of Dr. Maryan Puls.   [name] is a 64 y.o. female with history of HTN, DM2, CAD s/p CABG x5 with MV annuloplasty (2007), ischemic cardiomyopathy s/p Medtronic CRT-D, chronic systolic HF EF 42-68%, CKD stage III, PAF and PAD. Blood type O+.  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 At time of cath LAD, LCX and RCA occluded. EF 45%. Had PCI of LAD followed by abrupt stent occlusion. Had repeat PCI of LAD and then underwent CABG x 5 with mitral valve annuloplasty in 2007.   Amitted 8/2-10/07/13 for syncope s/p ICD shock. Concern that VT was possibly from ischemia and taken for [facility]. Cath stable. Started on Amiodarone. Cardiac output dropped while in hospital so started on milrinone. Discharged home. Underwent CRT-D upgrade on 12/02/13  Underwent Generator change and lead revision 11/13/17. Goal was for HIS bundle pacing, but ended up with septal pacing. Was feeling bad after and saw Dr. Caryl Comes on 12/11/17.  Now s/p lead revision. HIS lead was capped and LV lead was incorporated into system.   Recently struggling with new onset AF. Underwent DC-CV on 04/01/19 but went back to AF. Amio increased to 400 bid and scheduled for repeat DC-CV on 2/12. On arrival device interrogation showed conversion to NSR. Felt terrible NYHA IIIB-IV. Unable to do ADLS without significant fatigue and SOB. Thus taken for RHC cath which showed marked volume overload and moderately depressed cardiac output.RA 14 PA 66/28 PCWP 25 CI 2.2Worsening HF symptoms felt to be related to AF as well as higher dose of amiodarone.   Admitted from cath lab for IV diuresis. Amiocut back to 200 bid. She did not require inotropes.  Diuresed with IV lasix and metolazone. Transitioned back to lasix 80 mg po daily + metolazone weekly. Losartan was cut back to due to elevated creatinine._0  Subsequently seen at [facility] for heart/kidney transplant. Had repeat RHC with elevated pressures and PVR 4.0. Final decision pending.   Seen in HF Clinic yesterday. Found to be back in AF. Amio increased.   Presented to the ED today with recurrent syncope/presyncope. Said she had a brief episode of presyncope over the weekend. Yesterday in Clinic device check showed recurrent afib without significant other arrhythmias.   Today she had another episode of presyncope. Called 911 and brought to ER. In ER developed VT with loss of consciouness. Underwent DC-CV to NSR. Had brief CPR. K found to be 2.5. given k and started on IV amio. Now back to baseline. BP low so received IVF.    Review of Systems: [y] = yes, [ ] = no   . General: Weight gain [ ]; Weight loss [ ]; Anorexia [ ]; Fatigue [ y]; Fever [ ]; Chills [ ]; Weakness [ ]  . Cardiac: Chest pain/pressure [ ]; Resting SOB [ ]; Exertional SOB [ y]; Orthopnea [ ]; Pedal Edema [ ]; Palpitations [ ]; Syncope Blue.Reese ]; Presyncope Blue.Reese ]; Paroxysmal nocturnal dyspnea[ ]  . Pulmonary: Cough [ ]; Wheezing[ ]; Hemoptysis[ ]; Sputum [ ]; Snoring [ ]  . GI: Vomiting[ ]; Dysphagia[ ]; Melena[ ]; Hematochezia [ ]; Heartburn[ ]; Abdominal pain [ ]; Constipation [ ]; Diarrhea [ ]; BRBPR [ ]  .  GU: Hematuria[ ]; Dysuria [ ]; Nocturia[ ]  . Vascular: Pain in legs with walking [ ]; Pain in feet with lying flat [ ]; Non-healing sores [ ]; Stroke [ ]; TIA [ ]; Slurred speech [ ];  . Neuro: Headaches[y ]; Vertigo[ ]; Seizures[ ]; Paresthesias[ ];Blurred vision [ ]; Diplopia [ ]; Vision changes [ ]  . Ortho/Skin: Arthritis [ y]; Joint pain [ y]; Muscle pain [ ]; Joint swelling [ ]; Back Pain [ ]; Rash [ ]  . Psych: Depression[ ]; Anxiety[ ]  . Heme: Bleeding problems [ ]; Clotting disorders [ ]; Anemia [ ]   . Endocrine: Diabetes [ ]; Thyroid dysfunction[ ]  Home Medications Prior to Admission medications   Medication Sig Start Date End Date Taking? Authorizing Provider  amiodarone (PACERONE) 400 MG tablet Take 1 tablet (400 mg total) by mouth 2 (two) times daily. 04/26/19  Yes Clegg, Amy D, NP  apixaban (ELIQUIS) 5 MG TABS tablet Take 1 tablet (5 mg total) by mouth 2 (two) times daily. 03/28/19  Yes Sherran Needs, NP  ACCU-CHEK FASTCLIX LANCETS MISC Use to test blood glucose 3 times daily. Dx:E11.9 08/26/17   Lucious Groves, DO  acetaminophen (TYLENOL) 500 MG tablet Take 1,000 mg by mouth every 6 (six) hours as needed for moderate pain or headache.    [provider]  allopurinol (ZYLOPRIM) 100 MG tablet Take 200 mg by mouth at bedtime.    [provider]  amoxicillin (AMOXIL) 500 MG capsule Take 2,000 mg by mouth See admin instructions. Take 2000 mg 1 hour prior to dental work 02/09/19   [provider]  atorvastatin (LIPITOR) 80 MG tablet Take 1 tablet by mouth once daily 04/22/19   Nereyda Bowler, Shaune Pascal, MD  Blood Glucose Monitoring Suppl (ACCU-CHEK NANO SMARTVIEW) W/DEVICE KIT Use to test blood glucose 2 times daily. Dx:E11.9 10/18/14   Lenore Cordia, MD  canagliflozin (INVOKANA) 100 MG TABS tablet Take 100 mg by mouth daily before breakfast.    [provider]  Cholecalciferol (VITAMIN D3) 125 MCG (5000 UT) CAPS Take 5,000 Units by mouth daily.    [provider]  digoxin (LANOXIN) 0.25 MG tablet Take 0.0625 mg by mouth every other day.    [provider]  furosemide (LASIX) 80 MG tablet Take 1 tablet (80 mg total) by mouth 2 (two) times daily. May take an additional 1/2 as needed 04/26/19   Clegg, Amy D, NP  glucose blood (ACCU-CHEK SMARTVIEW) test strip Use to test blood glucose 2-3 times daily. Dx:E11.9. INSULIN DEPENDENT 03/15/19   Ina Homes, MD  Insulin Pen Needle 32G X 4 MM MISC Use to inject insulin 1 time a day 08/26/17   Lucious Groves, DO  levothyroxine (SYNTHROID) 25 MCG tablet Take 1 tablet (25 mcg total) by mouth daily before breakfast. 10/26/18   Deboraha Sprang, MD  losartan (COZAAR) 25 MG tablet Take 0.5 tablets (12.5 mg total) by mouth daily. 04/19/19   Clegg, Amy D, NP  Melatonin 10 MG CAPS Take 10 mg by mouth at bedtime as needed (sleep).    [provider]  metolazone (ZAROXOLYN) 2.5 MG tablet Take 2.5 mg by mouth 2 (two) times a week. May take an additional tab ONLY as directed by CHF clinic     [provider]  nitroGLYCERIN (NITROSTAT) 0.4 MG SL tablet Place 0.4 mg under the tongue every 5 (five) minutes as needed for chest pain.    [provider]  potassium chloride SA (KLOR-CON) 20 MEQ tablet Take 1 tablet (20 mEq total) by mouth 2 (two) times daily. With an additional 20 MEQ every Metolazone 04/26/19   Clegg, Amy D, NP  Semaglutide (RYBELSUS) 7 MG TABS Take 7 mg by mouth daily.    [provider]    Past Medical History: Past Medical History:  Diagnosis Date  . AICD (automatic cardioverter/defibrillator) present   . Chronic systolic heart failure ([place])    a. ICM b. ECHO (06/2013): EF 15%, severe MR (06/2013) c. RHC (10/2013): RA 5, RV 23/2/5, PA 25/6 (14), PCWP 12, Fick CO/CI: 3.2/1.7, PVR 0.7 WU, PA 45%, 47% and 55% (with levophed 5), vagal response during cath. d. On home milrinone.  . CKD (chronic kidney disease), stage III   . Coronary artery disease    a. s/p CABG x 5 with MV annuloplasty 2007   . Diabetes mellitus   . DVT (deep venous thrombosis) ([place]) 2009  . Hypertension   . Implantable defibrillator   medtronic   . Ischemic cardiomyopathy    a. s/p ICD. b. LHC (10/04/13): 1. Severe native CAD with all bypass grafts patent. c. CRT upgrade 12/2013.  [name] LBBB (left bundle branch block)   . Lipoma   . PAD (peripheral artery disease) ([place])   . PICC line infection    a. 01/2014 - PICC exchanged.  . Polysubstance abuse ([place])    history of  (cocaine, tobacco and  ETOH)  . Presence of permanent cardiac pacemaker   . [facility])     Past Surgical History: Past Surgical History:  Procedure Laterality Date  . ANKLE FRACTURE SURGERY Right    plate inserted  . ANKLE SURGERY Right    plate removed  . BI-VENTRICULAR IMPLANTABLE CARDIOVERTER DEFIBRILLATOR UPGRADE  12/02/2013   [facility] [place] CRTD upgrade by Dr Caryl Comes  . BI-VENTRICULAR IMPLANTABLE CARDIOVERTER DEFIBRILLATOR UPGRADE N/A 12/02/2013   Procedure: BI-VENTRICULAR IMPLANTABLE CARDIOVERTER DEFIBRILLATOR UPGRADE;  Surgeon: Deboraha Sprang, MD;  Location: [facility] CATH LAB;  Service: Cardiovascular;  Laterality: N/A;  . CARDIAC CATHETERIZATION    . CARDIAC DEFIBRILLATOR PLACEMENT     2011, Followed By Dr. Caryl Comes  . CARDIOVERSION N/A 04/01/2019   Procedure: CARDIOVERSION;  Surgeon: Jolaine Artist, MD;  Location: [place];  Service: Cardiovascular;  Laterality: N/A;  . COLONOSCOPY WITH PROPOFOL N/A 06/08/2014   Procedure: COLONOSCOPY WITH PROPOFOL;  Surgeon: Wilford Corner, MD;  Location: [facility] ENDOSCOPY;  Service: Endoscopy;  Laterality: N/A;  . COLONOSCOPY WITH PROPOFOL N/A 06/17/2017   Procedure: COLONOSCOPY WITH PROPOFOL;  Surgeon: Wilford Corner, MD;  Location: [place];  Service: Endoscopy;  Laterality: N/A;  . CORONARY ARTERY BYPASS GRAFT  10/2005  . ICD GENERATOR CHANGEOUT N/A 11/13/2017   Procedure: ICD GENERATOR CHANGEOUT;  Surgeon: Deboraha Sprang, MD;  Location: [place] CV LAB;  Service: Cardiovascular;  Laterality: N/A;  . LEAD REVISION/REPAIR N/A 11/13/2017   Procedure: LEAD REVISION/REPAIR;  Surgeon: Deboraha Sprang, MD;  Location: [place] CV LAB;  Service: Cardiovascular;  Laterality: N/A;  . LEAD REVISION/REPAIR N/A 02/03/2018   Procedure: LEAD REVISION/REPAIR;  Surgeon: Deboraha Sprang, MD;  Location: [place] CV LAB;  Service: Cardiovascular;  Laterality: N/A;  . LEFT AND RIGHT HEART CATHETERIZATION WITH CORONARY/GRAFT ANGIOGRAM N/A 03/17/2011   Procedure: LEFT AND RIGHT HEART  CATHETERIZATION WITH Beatrix Fetters;  Surgeon: Jolaine Artist, MD;  Location: [facility] CATH LAB;  Service: Cardiovascular;  Laterality: N/A;  . LEFT AND RIGHT HEART CATHETERIZATION WITH CORONARY/GRAFT  ANGIOGRAM N/A 10/04/2013   Procedure: LEFT AND RIGHT HEART CATHETERIZATION WITH Beatrix Fetters;  Surgeon: Jolaine Artist, MD;  Location: [facility] CATH LAB;  Service: Cardiovascular;  Laterality: N/A;  . RIGHT HEART CATH N/A 04/15/2019   Procedure: RIGHT HEART CATH;  Surgeon: Jolaine Artist, MD;  Location: [place] CV LAB;  Service: Cardiovascular;  Laterality: N/A;  . RIGHT HEART CATHETERIZATION N/A 05/23/2013   Procedure: RIGHT HEART CATH;  Surgeon: Jolaine Artist, MD;  Location: [facility] CATH LAB;  Service: Cardiovascular;  Laterality: N/A;  . RIGHT HEART CATHETERIZATION N/A 11/01/2013   Procedure: RIGHT HEART CATH;  Surgeon: Jolaine Artist, MD;  Location: [facility] CATH LAB;  Service: Cardiovascular;  Laterality: N/A;  . RIGHT HEART CATHETERIZATION N/A 11/25/2013   Procedure: RIGHT HEART CATH;  Surgeon: Jolaine Artist, MD;  Location: [facility] CATH LAB;  Service: Cardiovascular;  Laterality: N/A;  . RIGHT/LEFT HEART CATH AND CORONARY/GRAFT ANGIOGRAPHY N/A 03/10/2017   Procedure: RIGHT/LEFT HEART CATH AND CORONARY/GRAFT ANGIOGRAPHY;  Surgeon: Jolaine Artist, MD;  Location: [place] CV LAB;  Service: Cardiovascular;  Laterality: N/A;  . TEE WITHOUT CARDIOVERSION N/A 06/02/2013   Procedure: TRANSESOPHAGEAL ECHOCARDIOGRAM (TEE);  Surgeon: Larey Dresser, MD;  Location: [facility];  Service: Cardiovascular;  Laterality: N/A;  . TEE WITHOUT CARDIOVERSION N/A 04/01/2019   Procedure: TRANSESOPHAGEAL ECHOCARDIOGRAM (TEE);  Surgeon: Jolaine Artist, MD;  Location: [facility] ENDOSCOPY;  Service: Cardiovascular;  Laterality: N/A;    Family History: Family History  Problem Relation Age of Onset  . Heart attack Mother 47  . Heart attack Father   . CVA Father   . Diabetes Maternal Grandmother     . Heart attack Cousin   . Heart attack Maternal Uncle     Social History: Social History   Socioeconomic History  . Marital status: Widowed    Spouse name: Not on file  . Number of children: Not on file  . Years of education: Not on file  . Highest education level: Not on file  Occupational History  . Occupation: disable    Employer: UNEMPLOYED  Tobacco Use  . Smoking status: Former Smoker    Years: 32.00    Types: Cigarettes    Quit date: 03/03/2011    Years since quitting: 8.1  . Smokeless tobacco: Never Used  . Tobacco comment: light smoker  Substance and Sexual Activity  . Alcohol use: No    Alcohol/week: 0.0 standard drinks  . Drug use: No    Comment: Has history of cocaine use, denies recent  . Sexual activity: Not on file  Other Topics Concern  . Not on file  Social History Narrative  . Not on file   Social Determinants of Health   Financial Resource Strain:   . Difficulty of Paying Living Expenses: Not on file  Food Insecurity:   . Worried About Charity fundraiser in the Last Year: Not on file  . Ran Out of Food in the Last Year: Not on file  Transportation Needs:   . Lack of Transportation (Medical): Not on file  . Lack of Transportation (Non-Medical): Not on file  Physical Activity:   . Days of Exercise per Week: Not on file  . Minutes of Exercise per Session: Not on file  Stress:   . Feeling of Stress : Not on file  Social Connections:   . Frequency of Communication with Friends and Family: Not on file  . Frequency of Social Gatherings with Friends and Family: Not on  file  . Attends Religious Services: Not on file  . Active Member of Clubs or Organizations: Not on file  . Attends Archivist Meetings: Not on file  . Marital Status: Not on file    Allergies:  No Known Allergies  Objective:    Vital Signs:   Temp:  [98.3 F (36.8 C)] 98.3 F (36.8 C) (02/24 1838) Pulse Rate:  [69-92] 69 (02/24 2145) Resp:  [16-26] 16 (02/24  2145) BP: (78-113)/(55-100) 78/62 (02/24 2145) SpO2:  [93 %-98 %] 97 % (02/24 2145) Weight:  [79.4 kg-175 kg] 79.4 kg (02/24 2052)    Weight change: Filed Weights   04/27/19 2022 04/27/19 2052  Weight: (!) 175 kg 79.4 kg    Intake/Output:  No intake or output data in the 24 hours ending 04/27/19 2206    Physical Exam    General:  Well appearing. No resp difficulty HEENT: normal Neck: supple. JVP 6-7 . Carotids 2+ bilat; no bruits. No lymphadenopathy or thyromegaly appreciated. Cor: PMI nondisplaced. Regular rate & rhythm. 2/6 MR  Lungs: clear Abdomen: soft, nontender, nondistended. No hepatosplenomegaly. No bruits or masses. Good bowel sounds. Extremities: no cyanosis, clubbing, rash, edema Neuro: alert & orientedx3, cranial nerves grossly intact. moves all 4 extremities w/o difficulty. Affect pleasant   EKG    Sinus with v-pacing at 83 Personally reviewed  Labs   Basic Metabolic Panel: Recent Labs  Lab 04/27/19 1907 04/27/19 2030  NA 132*  --   K 2.5*  --   CL 91*  --   CO2 25  --   GLUCOSE 185*  --   BUN 50*  --   CREATININE 2.15*  --   CALCIUM 9.5  --   MG  --  2.1    Liver Function Tests: No results for input(s): AST, ALT, ALKPHOS, BILITOT, PROT, ALBUMIN in the last 168 hours. No results for input(s): LIPASE, AMYLASE in the last 168 hours. No results for input(s): AMMONIA in the last 168 hours.  CBC: Recent Labs  Lab 04/27/19 1907  WBC 6.2  NEUTROABS 4.5  HGB 13.3  HCT 40.3  MCV 87.6  PLT 212    Cardiac Enzymes: No results for input(s): CKTOTAL, CKMB, CKMBINDEX, TROPONINI in the last 168 hours.  BNP: BNP (last 3 results) Recent Labs    08/18/18 1308 02/22/19 1400  BNP 334.9* 218.2*    ProBNP (last 3 results) No results for input(s): PROBNP in the last 8760 hours.   CBG: Recent Labs  Lab 04/27/19 1905  GLUCAP 180*    Coagulation Studies: Recent Labs    04/27/19 1907  LABPROT 18.4*  INR 1.5*     Imaging   DG Chest  Port 1 View  Result Date: 04/27/2019 CLINICAL DATA:  Initial evaluation for acute weakness, near syncope. EXAM: PORTABLE CHEST 1 VIEW COMPARISON:  Prior radiograph from 12/11/2017. FINDINGS: Left-sided transvenous pacemaker/AICD in place. Median sternotomy wires with underlying CABG markers and valvular prosthesis noted, stable. Cardiomegaly grossly stable allowing for differences in technique. Mediastinal silhouette within normal limits. Lungs are hypoinflated. Perihilar vascular and interstitial prominence suggest mild pulmonary interstitial congestion. No visible pleural effusion. No focal infiltrates or consolidative opacity. No pneumothorax. No acute osseous finding. IMPRESSION: 1. Cardiomegaly with mild diffuse pulmonary interstitial congestion/edema. 2. No other active cardiopulmonary disease. Electronically Signed   By: Jeannine Boga M.D.   On: 04/27/2019 19:36      Medications:     Current Medications:   Infusions: . amiodarone 60 mg/hr (04/27/19  2004)  . [START ON 04/28/2019] amiodarone    . potassium chloride 10 mEq (04/27/19 2125)       Assessment/Plan   1. VT - in setting of hypokalemia and advanced HF - now s/p DC-CV - supp electrolytes. Keep K > 4.0 Mg> 2.0 -- continue IV amio  2. Chronic biventricular systolic HF due to iCM - TEE 2/21: EF 20-25% RV moderately down severe MR/TR - Recently NYHA IIIb-IV with multiple arrhythmias including new onset AF and recurrent VT.  - s/p Medtronic CRT-D, - Had LV lead revision in 9/19 and unable to place lead in CS so received HIS lead placement. Underwent lead revision 02/03/18 with cessation of HIS lead pacing and LV lead - [facility]. I have d/w Dr. Radene Knee. Blood Type O  -Intolerant entresto and carvedilol due to hypotension. [name] stopped with worsening renal function. Just on low dose losartan  3. CAD:  - Stable on cath 1/19 - no ischemic symptoms.  - Continue ASA/statin  4. Mitral  regurgitation:  - S/p mitral valve annuloplasty with CABG in 2007. Moderate to severe MR on echo in 4/15. TEE (06/2013) mitral regurgitation appeared severe, anatomy of repaired valve does not look suitable for MV clipping and would be high risk for MV replacement.  - TEE 2/21: EF 20-25% RV moderately down severe MR/TR  5. 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 - Follows with [facility] - baseline creatine ~ 1.7   She is now stabilizing after DC-CV of recurrent VT in setting of hypokalemia and progressive deterioration of her HF. Will admit to ICU. I will d/w Dr. Radene Knee again in am regarding timing to complete w/u for transplant vs VAD.   Length of Stay: 0  Glori Bickers, MD  04/27/2019, 10:06 PM  Advanced Heart Failure Team Pager [phone] (M-F; 7a - 4p)  Please contact [facility] for night-coverage after hours (4p -7a ) and weekends on [email or web address]

## 2019-04-27 NOTE — ED Notes (Signed)
Pacemaker interrogated by this RN

## 2019-04-27 NOTE — ED Notes (Signed)
Cardiology at the bedside.

## 2019-04-27 NOTE — ED Provider Notes (Addendum)
[facility] Provider Note   CSN: [id number] Arrival date & time: 04/27/19  1824     History Chief Complaint  Patient presents with  . Pacemaker Problem    [name] is a 64 y.o. female.  HPI   Patient presented to the ED for evaluation of a near syncopal event.  Patient has a very complicated medical history.  She has a history of congestive heart failure and ischemic cardiomyopathy.  Patient has an AICD pacemaker in place.  Patient is being considered for a transplant.  Patient states that today she had sudden onset of feeling poorly.  She felt like she was going to pass out.  She thought she might have been out of rhythm.  This occurred sometime around 530 or so.  It lasted maybe for 5 to 10 minutes.  Patient called EMS.  She is feeling somewhat better now.  She is not having any trouble with chest pain or shortness of breath.  No fevers or chills.  No vomiting or diarrhea.  Past Medical History:  Diagnosis Date  . AICD (automatic cardioverter/defibrillator) present   . Chronic systolic heart failure ([place])    a. ICM b. ECHO (06/2013): EF 15%, severe MR (06/2013) c. RHC (10/2013): RA 5, RV 23/2/5, PA 25/6 (14), PCWP 12, Fick CO/CI: 3.2/1.7, PVR 0.7 WU, PA 45%, 47% and 55% (with levophed 5), vagal response during cath. d. On home milrinone.  . CKD (chronic kidney disease), stage III   . Coronary artery disease    a. s/p CABG x 5 with MV annuloplasty 2007   . Diabetes mellitus   . DVT (deep venous thrombosis) ([place]) 2009  . Hypertension   . Implantable defibrillator   medtronic   . Ischemic cardiomyopathy    a. s/p ICD. b. LHC (10/04/13): 1. Severe native CAD with all bypass grafts patent. c. CRT upgrade 12/2013.  [name] LBBB (left bundle branch block)   . Lipoma   . PAD (peripheral artery disease) ([place])   . PICC line infection    a. 01/2014 - PICC exchanged.  . Polysubstance abuse ([place])    history of  (cocaine, tobacco and ETOH)  .  Presence of permanent cardiac pacemaker   . V-tach [facility])     Patient Active Problem List   Diagnosis Date Noted  . Acute on chronic systolic (congestive) heart failure ([place]) 04/15/2019  . Shingles 03/08/2018  . S/P ICD (internal cardiac defibrillator) procedure 11/13/17 ERI lead revision and gen change.MDT 11/14/2017  . Congestive heart failure, NYHA class 3, chronic, systolic ([place]) 84/16/6063  . Pap smear abnormality of cervix/human papillomavirus (HPV) positive 08/19/2017  . Personal history of colonic polyps 06/17/2017  . 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 Healthcare maintenance 01/18/2015  . PAD (peripheral artery disease) ([place]) 08/29/2014  . Keloid of skin 04/09/2014  . CAD (coronary artery disease) of artery bypass graft 02/28/2014  . Implantable defibrillator   medtronic   . LBBB (left bundle branch block)   . Chronic systolic heart failure ([place]) 11/15/2013  . Hip pain, bilateral 09/09/2013  . Mitral regurgitation 05/31/2013  . Amaurosis fugax 04/14/2013  . Diabetic neuropathy ([place]) 03/07/2013  . Onychomycosis 09/30/2012  . Hypertension associated with diabetes ([place])   . CKD (chronic kidney disease) stage 3, GFR 30-59 ml/min (HCC) 02/05/2012  . Hyperlipidemia LDL goal < 70 02/02/2012  . History of cocaine abuse ([place]) 02/02/2012  . Cirrhosis of liver ([place]) 02/02/2012  . Liver lesion, right lobe  02/02/2012  . Popliteal artery embolism, history of 02/02/2012  . Diabetes mellitus type 2, controlled ([place]) 01/22/2012  . Ischemic/ nonischemic cardiomyopathy 08/14/2011  . Gout 05/21/2011    Past Surgical History:  Procedure Laterality Date  . ANKLE FRACTURE SURGERY Right    plate inserted  . ANKLE SURGERY Right    plate removed  . BI-VENTRICULAR IMPLANTABLE CARDIOVERTER DEFIBRILLATOR UPGRADE  12/02/2013   MDT [place] CRTD upgrade by Dr Caryl Comes  . BI-VENTRICULAR IMPLANTABLE CARDIOVERTER DEFIBRILLATOR UPGRADE N/A 12/02/2013   Procedure: BI-VENTRICULAR IMPLANTABLE  CARDIOVERTER DEFIBRILLATOR UPGRADE;  Surgeon: Deboraha Sprang, MD;  Location: [facility] CATH LAB;  Service: Cardiovascular;  Laterality: N/A;  . CARDIAC CATHETERIZATION    . CARDIAC DEFIBRILLATOR PLACEMENT     2011, Followed By Dr. Caryl Comes  . CARDIOVERSION N/A 04/01/2019   Procedure: CARDIOVERSION;  Surgeon: Jolaine Artist, MD;  Location: [facility];  Service: Cardiovascular;  Laterality: N/A;  . COLONOSCOPY WITH PROPOFOL N/A 06/08/2014   Procedure: COLONOSCOPY WITH PROPOFOL;  Surgeon: Wilford Corner, MD;  Location: [facility] ENDOSCOPY;  Service: Endoscopy;  Laterality: N/A;  . COLONOSCOPY WITH PROPOFOL N/A 06/17/2017   Procedure: COLONOSCOPY WITH PROPOFOL;  Surgeon: Wilford Corner, MD;  Location: [facility];  Service: Endoscopy;  Laterality: N/A;  . CORONARY ARTERY BYPASS GRAFT  10/2005  . ICD GENERATOR CHANGEOUT N/A 11/13/2017   Procedure: ICD GENERATOR CHANGEOUT;  Surgeon: Deboraha Sprang, MD;  Location: [facility] CV LAB;  Service: Cardiovascular;  Laterality: N/A;  . LEAD REVISION/REPAIR N/A 11/13/2017   Procedure: LEAD REVISION/REPAIR;  Surgeon: Deboraha Sprang, MD;  Location: [place] CV LAB;  Service: Cardiovascular;  Laterality: N/A;  . LEAD REVISION/REPAIR N/A 02/03/2018   Procedure: LEAD REVISION/REPAIR;  Surgeon: Deboraha Sprang, MD;  Location: [place] CV LAB;  Service: Cardiovascular;  Laterality: N/A;  . LEFT AND RIGHT HEART CATHETERIZATION WITH CORONARY/GRAFT ANGIOGRAM N/A 03/17/2011   Procedure: LEFT AND RIGHT HEART CATHETERIZATION WITH Beatrix Fetters;  Surgeon: Jolaine Artist, MD;  Location: [facility] CATH LAB;  Service: Cardiovascular;  Laterality: N/A;  . LEFT AND RIGHT HEART CATHETERIZATION WITH CORONARY/GRAFT ANGIOGRAM N/A 10/04/2013   Procedure: LEFT AND RIGHT HEART CATHETERIZATION WITH Beatrix Fetters;  Surgeon: Jolaine Artist, MD;  Location: [facility] CATH LAB;  Service: Cardiovascular;  Laterality: N/A;  . RIGHT HEART CATH N/A 04/15/2019   Procedure: RIGHT HEART  CATH;  Surgeon: Jolaine Artist, MD;  Location: [place] CV LAB;  Service: Cardiovascular;  Laterality: N/A;  . RIGHT HEART CATHETERIZATION N/A 05/23/2013   Procedure: RIGHT HEART CATH;  Surgeon: Jolaine Artist, MD;  Location: [facility] CATH LAB;  Service: Cardiovascular;  Laterality: N/A;  . RIGHT HEART CATHETERIZATION N/A 11/01/2013   Procedure: RIGHT HEART CATH;  Surgeon: Jolaine Artist, MD;  Location: [facility] CATH LAB;  Service: Cardiovascular;  Laterality: N/A;  . RIGHT HEART CATHETERIZATION N/A 11/25/2013   Procedure: RIGHT HEART CATH;  Surgeon: Jolaine Artist, MD;  Location: [facility] CATH LAB;  Service: Cardiovascular;  Laterality: N/A;  . RIGHT/LEFT HEART CATH AND CORONARY/GRAFT ANGIOGRAPHY N/A 03/10/2017   Procedure: RIGHT/LEFT HEART CATH AND CORONARY/GRAFT ANGIOGRAPHY;  Surgeon: Jolaine Artist, MD;  Location: [facility] CV LAB;  Service: Cardiovascular;  Laterality: N/A;  . TEE WITHOUT CARDIOVERSION N/A 06/02/2013   Procedure: TRANSESOPHAGEAL ECHOCARDIOGRAM (TEE);  Surgeon: Larey Dresser, MD;  Location: [facility];  Service: Cardiovascular;  Laterality: N/A;  . TEE WITHOUT CARDIOVERSION N/A 04/01/2019   Procedure: TRANSESOPHAGEAL ECHOCARDIOGRAM (TEE);  Surgeon: Jolaine Artist, MD;  Location: [facility]  ENDOSCOPY;  Service: Cardiovascular;  Laterality: N/A;     OB History   No obstetric history on file.     Family History  Problem Relation Age of Onset  . Heart attack Mother 44  . Heart attack Father   . CVA Father   . Diabetes Maternal Grandmother   . Heart attack Cousin   . Heart attack Maternal Uncle     Social History   Tobacco Use  . Smoking status: Former Smoker    Years: 32.00    Types: Cigarettes    Quit date: 03/03/2011    Years since quitting: 8.1  . Smokeless tobacco: Never Used  . Tobacco comment: light smoker  Substance Use Topics  . Alcohol use: No    Alcohol/week: 0.0 standard drinks  . Drug use: No    Comment: Has history of cocaine use, denies  recent    Home Medications Prior to Admission medications   Medication Sig Start Date End Date Taking? Authorizing Provider  ACCU-CHEK FASTCLIX LANCETS MISC Use to test blood glucose 3 times daily. Dx:E11.9 08/26/17   Lucious Groves, DO  acetaminophen (TYLENOL) 500 MG tablet Take 1,000 mg by mouth every 6 (six) hours as needed for moderate pain or headache.    [provider]  allopurinol (ZYLOPRIM) 100 MG tablet Take 200 mg by mouth at bedtime.    [provider]  amiodarone (PACERONE) 400 MG tablet Take 1 tablet (400 mg total) by mouth 2 (two) times daily. 04/26/19   Clegg, Amy D, NP  amoxicillin (AMOXIL) 500 MG capsule Take 2,000 mg by mouth See admin instructions. Take 2000 mg 1 hour prior to dental work 02/09/19   [provider]  apixaban (ELIQUIS) 5 MG TABS tablet Take 1 tablet (5 mg total) by mouth 2 (two) times daily. 03/28/19   Sherran Needs, NP  atorvastatin (LIPITOR) 80 MG tablet Take 1 tablet by mouth once daily 04/22/19   Bensimhon, Shaune Pascal, MD  Blood Glucose Monitoring Suppl (ACCU-CHEK NANO SMARTVIEW) W/DEVICE KIT Use to test blood glucose 2 times daily. Dx:E11.9 10/18/14   Lenore Cordia, MD  canagliflozin (INVOKANA) 100 MG TABS tablet Take 100 mg by mouth daily before breakfast.    [provider]  Cholecalciferol (VITAMIN D3) 125 MCG (5000 UT) CAPS Take 5,000 Units by mouth daily.    [provider]  digoxin (LANOXIN) 0.25 MG tablet Take 0.0625 mg by mouth every other day.    [provider]  furosemide (LASIX) 80 MG tablet Take 1 tablet (80 mg total) by mouth 2 (two) times daily. May take an additional 1/2 as needed 04/26/19   Clegg, Amy D, NP  glucose blood (ACCU-CHEK SMARTVIEW) test strip Use to test blood glucose 2-3 times daily. Dx:E11.9. INSULIN DEPENDENT 03/15/19   Ina Homes, MD  Insulin Pen Needle 32G X 4 MM MISC Use to inject insulin 1 time a day 08/26/17   Lucious Groves, DO  levothyroxine (SYNTHROID) 25 MCG  tablet Take 1 tablet (25 mcg total) by mouth daily before breakfast. 10/26/18   Deboraha Sprang, MD  losartan (COZAAR) 25 MG tablet Take 0.5 tablets (12.5 mg total) by mouth daily. 04/19/19   Clegg, Amy D, NP  Melatonin 10 MG CAPS Take 10 mg by mouth at bedtime as needed (sleep).    [provider]  metolazone (ZAROXOLYN) 2.5 MG tablet Take 2.5 mg by mouth 2 (two) times a week. May take an additional tab ONLY as directed by  CHF clinic     [provider]  nitroGLYCERIN (NITROSTAT) 0.4 MG SL tablet Place 0.4 mg under the tongue every 5 (five) minutes as needed for chest pain.    [provider]  potassium chloride SA (KLOR-CON) 20 MEQ tablet Take 1 tablet (20 mEq total) by mouth 2 (two) times daily. With an additional 20 MEQ every Metolazone 04/26/19   Clegg, Amy D, NP  Semaglutide (RYBELSUS) 7 MG TABS Take 7 mg by mouth daily.    [provider]    Allergies    Patient has no known allergies.  Review of Systems   Review of Systems  All other systems reviewed and are negative.   Physical Exam Updated Vital Signs BP (!) 113/100   Pulse 78   Temp 98.3 F (36.8 C) (Oral)   Resp (!) 22   SpO2 93%   Physical Exam Vitals and nursing note reviewed.  Constitutional:      Appearance: She is well-developed. She is not toxic-appearing or diaphoretic.  HENT:     Head: Normocephalic and atraumatic.     Right Ear: External ear normal.     Left Ear: External ear normal.  Eyes:     General: No scleral icterus.       Right eye: No discharge.        Left eye: No discharge.     Conjunctiva/sclera: Conjunctivae normal.  Neck:     Trachea: No tracheal deviation.  Cardiovascular:     Rate and Rhythm: Normal rate and regular rhythm.  Pulmonary:     Effort: Pulmonary effort is normal. No respiratory distress.     Breath sounds: Normal breath sounds. No stridor. No wheezing or rales.  Abdominal:     General: Bowel sounds are normal. There is no distension.      Palpations: Abdomen is soft.     Tenderness: There is no abdominal tenderness. There is no guarding or rebound.  Musculoskeletal:        General: No tenderness.     Cervical back: Neck supple.     Right lower leg: No edema.     Left lower leg: No edema.  Skin:    General: Skin is warm and dry.     Findings: No rash.  Neurological:     General: No focal deficit present.     Mental Status: She is alert.     Cranial Nerves: No cranial nerve deficit (no facial droop, extraocular movements intact, no slurred speech).     Sensory: No sensory deficit.     Motor: No abnormal muscle tone or seizure activity.     Coordination: Coordination normal.     ED Results / Procedures / Treatments   Labs (all labs ordered are listed, but only abnormal results are displayed) Labs Reviewed  PROTIME-INR - Abnormal; Notable for the following components:      Result Value   Prothrombin Time 18.4 (*)    INR 1.5 (*)    All other components within normal limits  CBG MONITORING, ED - Abnormal; Notable for the following components:   Glucose-Capillary 180 (*)    All other components within normal limits  RESPIRATORY PANEL BY RT PCR (FLU A&B, COVID)  CBC WITH DIFFERENTIAL/PLATELET  BASIC METABOLIC PANEL  MAGNESIUM  TROPONIN I (HIGH SENSITIVITY)  TROPONIN I (HIGH SENSITIVITY)    EKG Paced rhythm, rate 89 No prior EKG for comparison  EKG Interpretation  Date/Time:  Wednesday April 27 2019 19:31:43 EST Ventricular Rate:  165 PR Interval:    QRS Duration: 125 QT Interval:  342 QTC Calculation: 567 R Axis:   0 Text Interpretation: Wide-QRS tachycardia Ventricular tachycardia Confirmed by Dorie Rank [phone]) on 04/27/2019 7:59:58 PM       Radiology DG Chest Port 1 View  Result Date: 04/27/2019 CLINICAL DATA:  Initial evaluation for acute weakness, near syncope. EXAM: PORTABLE CHEST 1 VIEW COMPARISON:  Prior radiograph from 12/11/2017. FINDINGS: Left-sided transvenous pacemaker/AICD in place.  Median sternotomy wires with underlying CABG markers and valvular prosthesis noted, stable. Cardiomegaly grossly stable allowing for differences in technique. Mediastinal silhouette within normal limits. Lungs are hypoinflated. Perihilar vascular and interstitial prominence suggest mild pulmonary interstitial congestion. No visible pleural effusion. No focal infiltrates or consolidative opacity. No pneumothorax. No acute osseous finding. IMPRESSION: 1. Cardiomegaly with mild diffuse pulmonary interstitial congestion/edema. 2. No other active cardiopulmonary disease. Electronically Signed   By: Jeannine Boga M.D.   On: 04/27/2019 19:36    Procedures .Critical Care Performed by: Dorie Rank, MD Authorized by: Dorie Rank, MD   Critical care provider statement:    Critical care time (minutes):  45   Critical care was necessary to treat or prevent imminent or life-threatening deterioration of the following conditions:  Cardiac failure   Critical care was time spent personally by me on the following activities:  Discussions with consultants, evaluation of patient's response to treatment, examination of patient, ordering and performing treatments and interventions, ordering and review of laboratory studies, ordering and review of radiographic studies, pulse oximetry, re-evaluation of patient's condition, obtaining history from patient or surrogate and review of old charts   (including critical care time)  Medications Ordered in ED Medications  amiodarone (NEXTERONE PREMIX) 360-4.14 MG/200ML-% (1.8 mg/mL) IV infusion (60 mg/hr Intravenous New Bag/Given 04/27/19 2004)  amiodarone (NEXTERONE PREMIX) 360-4.14 MG/200ML-% (1.8 mg/mL) IV infusion (has no administration in time range)  amiodarone (CORDARONE) injection (150 mg Intravenous Given 04/27/19 1935)  EPINEPHrine (ADRENALIN) 1 MG/10ML injection (1 mg Intravenous Given 04/27/19 1940)  ondansetron (ZOFRAN) injection 4 mg (4 mg Intravenous Given  04/27/19 1950)    ED Course  I have reviewed the triage vital signs and the nursing notes.  Pertinent labs & imaging results that were available during my care of the patient were reviewed by me and considered in my medical decision making (see chart for details).  Clinical Course as of Apr 26 2018  Wed Apr 27, 2019  1820 Since yesterday 126 atrial arrhythmia noted.  Last episode since 1944 per device clock  (interrogation time 1954)   [JK]  1844 I was notified the patient was in ventricular tachycardia.  She did have a wide-complex tachycardia on the monitor.  Patient remained alert and awake.  Amiodarone 150 mg IV was provided.  We were preparing for an amiodarone infusion.  Patient initially was awake and symptomatic however patient then became unresponsive after about a minute or 2..  Patient was synchronized cardioverted at 200 J.  1 to 2 minutes of CPR was performed as the patient did not have a palpable pulse.  Patient then started having spontaneous respirations and had a palpable pulse.   [JK]  1958 Patient is currently awake and alert.  She did bite her tongue   [JK]    Clinical Course User Index [JK] Dorie Rank, MD   MDM Rules/Calculators/A&P                      Patient presented with a near syncopal  episode.  She has a very complex cardiac history including ischemic cardiomyopathy, coronary artery disease, and she has an AICD pacemaker.  During her evaluation in the ED the patient went into ventricular tachycardia.  Patient initially was stable she was started on amiodarone.  She was placed on defibrillator pads.  While withdrawal at the bedside infusing the amiodarone the patient then became unresponsive after a couple of minutes.  She received 1 shock of synchronized 200 J.  Patient went back into a sinus rhythm.  She was still unresponsive and we could not palpate a pulse for period of time after the initial cardioversion.  CPR was performed for approximately 2 to 3 minutes.   Patient had regained spontaneous circulation as well as spontaneous respirations.  Patient's mental status improved quickly to baseline.  She is now awake alert and talking.  I explained the episode to her.  We have continued her on amiodarone for effusion.  I have added a magnesium level.  Case was discussed with Dr. Harl Bowie, cardiology.  Patient will be admitted for further treatment.  Final Clinical Impression(s) / ED Diagnoses Final diagnoses:  Ventricular tachycardia ([place])  Injury of tongue, initial encounter      Dorie Rank, MD 04/27/19 2020

## 2019-04-27 NOTE — Progress Notes (Signed)
Per d/w Dr. Harl Bowie, admit orders written which include continuation of home meds except amiodarone (doing drip instead) and holding Lasix/metolazone pending reassessment of potassium. He has written for repletion. She also has a pharmacy alert queue up for Invokana given her renal insufficiency so will hold pending re-eval of kidney function in AM. She reports she takes her semaglutide at night. Have ordered CBG checks with [facility] if needed. I called into patient room to go over what meds she's taken today and based timing of next doses off that. TSH and digoxin level ordered for AM. Covid test pending in ED. Javae Braaten PA-C

## 2019-04-28 LAB — MAGNESIUM: Magnesium: 2.3 mg/dL (ref 1.7–2.4)

## 2019-04-28 LAB — CBC
HCT: 37.1 % (ref 36.0–46.0)
Hemoglobin: 11.9 g/dL — ABNORMAL LOW (ref 12.0–15.0)
MCH: 28.9 pg (ref 26.0–34.0)
MCHC: 32.1 g/dL (ref 30.0–36.0)
MCV: 90 fL (ref 80.0–100.0)
Platelets: 183 10*3/uL (ref 150–400)
RBC: 4.12 MIL/uL (ref 3.87–5.11)
RDW: 15.8 % — ABNORMAL HIGH (ref 11.5–15.5)
WBC: 10.9 10*3/uL — ABNORMAL HIGH (ref 4.0–10.5)
nRBC: 0 % (ref 0.0–0.2)

## 2019-04-28 LAB — DIGOXIN LEVEL: Digoxin Level: 0.8 ng/mL (ref 0.8–2.0)

## 2019-04-28 LAB — BASIC METABOLIC PANEL
Anion gap: 12 (ref 5–15)
BUN: 44 mg/dL — ABNORMAL HIGH (ref 8–23)
CO2: 26 mmol/L (ref 22–32)
Calcium: 8.6 mg/dL — ABNORMAL LOW (ref 8.9–10.3)
Chloride: 97 mmol/L — ABNORMAL LOW (ref 98–111)
Creatinine, Ser: 2.07 mg/dL — ABNORMAL HIGH (ref 0.44–1.00)
GFR calc Af Amer: 29 mL/min — ABNORMAL LOW (ref >60)
GFR calc non Af Amer: 25 mL/min — ABNORMAL LOW (ref >60)
Glucose, Bld: 204 mg/dL — ABNORMAL HIGH (ref 70–99)
Potassium: 3.6 mmol/L (ref 3.5–5.1)
Sodium: 135 mmol/L (ref 135–145)

## 2019-04-28 LAB — MRSA PCR SCREENING: MRSA by PCR: NEGATIVE

## 2019-04-28 LAB — HIV ANTIBODY (ROUTINE TESTING W REFLEX): HIV Screen 4th Generation wRfx: NONREACTIVE

## 2019-04-28 LAB — TSH: TSH: 3.547 u[IU]/mL (ref 0.350–4.500)

## 2019-04-28 LAB — GLUCOSE, CAPILLARY
Glucose-Capillary: 157 mg/dL — ABNORMAL HIGH (ref 70–99)
Glucose-Capillary: 121 mg/dL — ABNORMAL HIGH (ref 70–99)
Glucose-Capillary: 144 mg/dL — ABNORMAL HIGH (ref 70–99)
Glucose-Capillary: 154 mg/dL — ABNORMAL HIGH (ref 70–99)

## 2019-04-28 MED ORDER — OXYCODONE-ACETAMINOPHEN 5-325 MG PO TABS
1.0000 | ORAL_TABLET | Freq: Four times a day (QID) | ORAL | Status: DC | PRN
  Administered 2019-04-28 – 2019-05-02 (×11): 1 via ORAL
  Filled 2019-04-28 (×11): qty 1

## 2019-04-28 MED ORDER — POTASSIUM CHLORIDE CRYS ER 20 MEQ PO TBCR
30.0000 meq | EXTENDED_RELEASE_TABLET | Freq: Once | ORAL | Status: AC
  Administered 2019-04-28: 12:00:00 30 meq via ORAL
  Filled 2019-04-28: qty 1

## 2019-04-28 NOTE — Progress Notes (Addendum)
Per CCMD, patient had a 5 beat run of V-Tach. Patient currently sitting on edge of bed eating supper, denies chest pain, shortness of breath, or dizziness. Current vitals are as follows:   04/28/19 1844  Vitals  BP 94/72  MAP (mmHg) 79  BP Location Left Arm  BP Method Automatic  Patient Position (if appropriate) Sitting  Pulse Rate 87  Pulse Rate Source Monitor  Resp 16  Oxygen Therapy  SpO2 95 %  O2 Device Room Air  MEWS Score  MEWS Temp 0  MEWS Systolic 1  MEWS Pulse 0  MEWS RR 0  MEWS LOC 0  MEWS Score 1  MEWS Score Color Green   [place], MD paged. [place], MD states there is nothing that should be done for this.

## 2019-04-28 NOTE — Progress Notes (Addendum)
Advanced Heart Failure Rounding Note  PCP-Cardiologist: Glori Bickers, MD   Subjective:    64 y.o.femalewith history of HTN, DM2, CAD s/p CABG x5 with MV annuloplasty (2007), ischemic cardiomyopathy s/p Medtronic CRT-D, chronic systolic HF EF 82-99%, CKD stage III, PAF and PAD. Blood type O+. Undergoing heart/kidney transplant eval at [facility]. Presented to [facility] 2/24 due to presyncope. Called 911 and brought to ER. In ER developed VT with loss of consciouness. Underwent DC-CV to NSR. Had brief CPR. K found to be 2.5. K replaced and started on IV amio.   K 3.6 today  Mg 2.3  Dig level 0.8  TSH normal   Feels better today. No recurrent VT on tele. Chest wall mildly sore from CRP. No ischemic CP, dyspnea, recurrent syncope/ near syncope.   Suspect low K likely secondary to metolazone use.     Objective:   Weight Range: 81.9 kg Body mass index is 30.99 kg/m.   Vital Signs:   Temp:  [97.3 F (36.3 C)-98.3 F (36.8 C)] 97.3 F (36.3 C) (02/25 0801) Pulse Rate:  [53-92] 53 (02/25 1000) Resp:  [14-26] 24 (02/25 1000) BP: (78-113)/(53-100) 102/61 (02/25 1000) SpO2:  [93 %-100 %] 99 % (02/25 1000) Weight:  [79.4 kg-175 kg] 81.9 kg (02/25 0254)    Weight change: Filed Weights   04/27/19 2052 04/27/19 2314 04/28/19 0254  Weight: 79.4 kg 81.9 kg 81.9 kg    Intake/Output:   Intake/Output Summary (Last 24 hours) at 04/28/2019 1053 Last data filed at 04/28/2019 1000 Gross per 24 hour  Intake 1130.26 ml  Output 700 ml  Net 430.26 ml      Physical Exam    General:  Well appearing AAF. No resp difficulty HEENT: Normal Neck: Supple. JVP . Carotids 2+ bilat; no bruits. No lymphadenopathy or thyromegaly appreciated. Cor: PMI nondisplaced. Regular rate & rhythm. 2/6 MR murmur at apex Lungs: Clear Abdomen: Soft, nontender, nondistended. No hepatosplenomegaly. No bruits or masses. Good bowel sounds. Extremities: No cyanosis, clubbing, rash, edema Neuro: Alert & orientedx3,  cranial nerves grossly intact. moves all 4 extremities w/o difficulty. Affect pleasant   Telemetry   NSR 80s. No VT   EKG    Ventricular-paced rhythm with occasional atrial-paced complexes, QT/QTc 544/556 ms  Labs    CBC Recent Labs    04/27/19 1907 04/28/19 0241  WBC 6.2 10.9*  NEUTROABS 4.5  --   HGB 13.3 11.9*  HCT 40.3 37.1  MCV 87.6 90.0  PLT 212 371   Basic Metabolic Panel Recent Labs    04/27/19 1907 04/27/19 2030 04/28/19 0241  NA 132*  --  135  K 2.5*  --  3.6  CL 91*  --  97*  CO2 25  --  26  GLUCOSE 185*  --  204*  BUN 50*  --  44*  CREATININE 2.15*  --  2.07*  CALCIUM 9.5  --  8.6*  MG  --  2.1 2.3   Liver Function Tests No results for input(s): AST, ALT, ALKPHOS, BILITOT, PROT, ALBUMIN in the last 72 hours. No results for input(s): LIPASE, AMYLASE in the last 72 hours. Cardiac Enzymes No results for input(s): CKTOTAL, CKMB, CKMBINDEX, TROPONINI in the last 72 hours.  BNP: BNP (last 3 results) Recent Labs    08/18/18 1308 02/22/19 1400  BNP 334.9* 218.2*    ProBNP (last 3 results) No results for input(s): PROBNP in the last 8760 hours.   D-Dimer No results for input(s): DDIMER in the last  72 hours. Hemoglobin A1C No results for input(s): HGBA1C in the last 72 hours. Fasting Lipid Panel No results for input(s): CHOL, HDL, LDLCALC, TRIG, CHOLHDL, LDLDIRECT in the last 72 hours. Thyroid Function Tests Recent Labs    04/28/19 0246  TSH 3.547    Other results:   Imaging    DG Chest Port 1 View  Result Date: 04/27/2019 CLINICAL DATA:  Initial evaluation for acute weakness, near syncope. EXAM: PORTABLE CHEST 1 VIEW COMPARISON:  Prior radiograph from 12/11/2017. FINDINGS: Left-sided transvenous pacemaker/AICD in place. Median sternotomy wires with underlying CABG markers and valvular prosthesis noted, stable. Cardiomegaly grossly stable allowing for differences in technique. Mediastinal silhouette within normal limits. Lungs are  hypoinflated. Perihilar vascular and interstitial prominence suggest mild pulmonary interstitial congestion. No visible pleural effusion. No focal infiltrates or consolidative opacity. No pneumothorax. No acute osseous finding. IMPRESSION: 1. Cardiomegaly with mild diffuse pulmonary interstitial congestion/edema. 2. No other active cardiopulmonary disease. Electronically Signed   By: Jeannine Boga M.D.   On: 04/27/2019 19:36      Medications:     Scheduled Medications: . allopurinol  200 mg Oral QHS  . apixaban  5 mg Oral BID  . atorvastatin  80 mg Oral Daily  . Chlorhexidine Gluconate Cloth  6 each Topical Q0600  . cholecalciferol  5,000 Units Oral Daily  . [START ON 04/29/2019] digoxin  0.0625 mg Oral QODAY  . insulin aspart  0-9 Units Subcutaneous TID WC  . levothyroxine  25 mcg Oral QAC breakfast  . losartan  12.5 mg Oral Daily  . sodium chloride flush  3 mL Intravenous Q12H     Infusions: . sodium chloride 10 mL/hr at 04/28/19 1000  . amiodarone 30 mg/hr (04/28/19 1000)     PRN Medications:  sodium chloride, acetaminophen, Melatonin, sodium chloride flush    Assessment/Plan   1. VT - in setting of hypokalemia and advanced HF - now s/p DC-CV. No recurrence on tele  - supp electrolytes. Keep K > 4.0 Mg> 2.0 -continue IV amio load  2. Chronic biventricular systolic HF due to iCM - TEE 2/21: EF 20-25% RV moderately down severe MR/TR - Recently NYHA IIIb-IV with multiple arrhythmias including new onset AF and recurrent VT.  -s/p Medtronic CRT-D, - Had LV lead revision in 9/19 and unable to place lead in CS so received HIS lead placement. Underwent lead revision 02/03/18 with cessation of HIS lead pacing and LV lead - Undergoing transplant eva at [facility]. I have d/w Dr. Radene Knee. Blood Type O  -Intolerant entresto and carvedilol due to hypotension.[name] stopped with worsening renal function. Just on low dose losartan 12.5 + digoxin 0.0625  3. CAD:  - Stable on  cath 1/19 -no ischemic symptoms. - hs trop low level and flat, 29>>26 - Continue ASA/statin  4. Mitral regurgitation:  - S/p mitral valve annuloplasty with CABG in 2007. Moderate to severe MR on echo in 4/15. TEE (06/2013) mitral regurgitation appeared severe, anatomy of repaired valve does not look suitable for MV clipping and would be high risk for MV replacement.  - TEE 2/21: EF 20-25% RV moderately down severe MR/TR  5. 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 - Follows with [place] [facility] - baseline creatine ~ 1.7 - SCr 2.07 today (already got Losartan today) - repeat BMP, if SCr rises may need to hold next dose of losartan  6. Hypokalemia  -K 2.5 on admit>>had VT. Likely 2/2 metolazone  -3.6 after supplementation. Mg 2.3.  -  Will give another 30 mEq KCl now - no spiro w/ CKD - repeat BMP in the am.   Length of Stay: 1  Brittainy Simmons, PA-C  04/28/2019, 10:53 AM  Advanced Heart Failure Team Pager [phone] (M-F; 7a - 4p)  Please contact [facility] for night-coverage after hours (4p -7a ) and weekends on [email or web address]  Patient seen and examined with the above-signed Advanced Practice Provider and/or Housestaff. I personally reviewed laboratory data, imaging studies and relevant notes. I independently examined the patient and formulated the important aspects of the plan. I have edited the note to reflect any of my changes or salient points. I have personally discussed the plan with the patient and/or family.  Feels better today. No further sustained VT. K 3.6. Creatinine improving. Volume status looks ok.   General:  Lying in bed. No resp difficulty HEENT: normal Neck: supple. JVP 7 Carotids 2+ bilat; no bruits. No lymphadenopathy or thryomegaly appreciated. Cor: PMI nondisplaced. Regular rate & rhythm. 2/6 MR/TR Lungs: clear Abdomen: soft, nontender, nondistended. No hepatosplenomegaly. No bruits or masses. Good bowel sounds. Extremities: no  cyanosis, clubbing, rash, edema Neuro: alert & orientedx3, cranial nerves grossly intact. moves all 4 extremities w/o difficulty. Affect pleasant  Would continue to supp K. Hold diuretics today, Restart tomorrow. D/w [facility] regarding timing of finishing up transplant w/u. If remains stable over next 24-48 hours can likely go back as outpatient. Otherwise will need inpatient transfer.   Glori Bickers, MD  8:05 PM

## 2019-04-29 DIAGNOSIS — I5023 Acute on chronic systolic (congestive) heart failure: Secondary | ICD-10-CM

## 2019-04-29 LAB — BASIC METABOLIC PANEL
Anion gap: 10 (ref 5–15)
Anion gap: 9 (ref 5–15)
BUN: 35 mg/dL — ABNORMAL HIGH (ref 8–23)
BUN: 33 mg/dL — ABNORMAL HIGH (ref 8–23)
CO2: 27 mmol/L (ref 22–32)
CO2: 24 mmol/L (ref 22–32)
Calcium: 9.2 mg/dL (ref 8.9–10.3)
Calcium: 8.9 mg/dL (ref 8.9–10.3)
Chloride: 98 mmol/L (ref 98–111)
Chloride: 99 mmol/L (ref 98–111)
Creatinine, Ser: 1.67 mg/dL — ABNORMAL HIGH (ref 0.44–1.00)
Creatinine, Ser: 1.68 mg/dL — ABNORMAL HIGH (ref 0.44–1.00)
GFR calc Af Amer: 38 mL/min — ABNORMAL LOW (ref >60)
GFR calc Af Amer: 38 mL/min — ABNORMAL LOW (ref >60)
GFR calc non Af Amer: 33 mL/min — ABNORMAL LOW (ref >60)
GFR calc non Af Amer: 32 mL/min — ABNORMAL LOW (ref >60)
Glucose, Bld: 154 mg/dL — ABNORMAL HIGH (ref 70–99)
Glucose, Bld: 210 mg/dL — ABNORMAL HIGH (ref 70–99)
Potassium: 3.1 mmol/L — ABNORMAL LOW (ref 3.5–5.1)
Potassium: 3.9 mmol/L (ref 3.5–5.1)
Sodium: 135 mmol/L (ref 135–145)
Sodium: 132 mmol/L — ABNORMAL LOW (ref 135–145)

## 2019-04-29 LAB — GLUCOSE, CAPILLARY
Glucose-Capillary: 166 mg/dL — ABNORMAL HIGH (ref 70–99)
Glucose-Capillary: 176 mg/dL — ABNORMAL HIGH (ref 70–99)
Glucose-Capillary: 130 mg/dL — ABNORMAL HIGH (ref 70–99)
Glucose-Capillary: 147 mg/dL — ABNORMAL HIGH (ref 70–99)

## 2019-04-29 LAB — MAGNESIUM: Magnesium: 2.4 mg/dL (ref 1.7–2.4)

## 2019-04-29 MED ORDER — MAGNESIUM SULFATE IN D5W 1-5 GM/100ML-% IV SOLN
1.0000 g | Freq: Once | INTRAVENOUS | Status: AC
  Administered 2019-04-29: 04:00:00 1 g via INTRAVENOUS
  Filled 2019-04-29: qty 100

## 2019-04-29 MED ORDER — SPIRONOLACTONE 12.5 MG HALF TABLET
12.5000 mg | ORAL_TABLET | Freq: Every day | ORAL | Status: DC
  Administered 2019-04-29 – 2019-04-30 (×2): 12.5 mg via ORAL
  Filled 2019-04-29 (×2): qty 1

## 2019-04-29 MED ORDER — POTASSIUM CHLORIDE CRYS ER 20 MEQ PO TBCR
40.0000 meq | EXTENDED_RELEASE_TABLET | Freq: Four times a day (QID) | ORAL | Status: AC
  Administered 2019-04-29 (×2): 40 meq via ORAL
  Filled 2019-04-29: qty 2

## 2019-04-29 MED ORDER — FUROSEMIDE 80 MG PO TABS
80.0000 mg | ORAL_TABLET | Freq: Two times a day (BID) | ORAL | Status: DC
  Administered 2019-04-29 – 2019-04-30 (×3): 80 mg via ORAL
  Filled 2019-04-29 (×3): qty 1

## 2019-04-29 MED ORDER — POTASSIUM CHLORIDE CRYS ER 20 MEQ PO TBCR
40.0000 meq | EXTENDED_RELEASE_TABLET | Freq: Once | ORAL | Status: AC
  Administered 2019-04-29: 40 meq via ORAL
  Filled 2019-04-29: qty 2

## 2019-04-29 MED ORDER — ACETAMINOPHEN 325 MG PO TABS
650.0000 mg | ORAL_TABLET | Freq: Once | ORAL | Status: AC
  Administered 2019-04-29: 650 mg via ORAL
  Filled 2019-04-29: qty 2

## 2019-04-29 NOTE — Progress Notes (Addendum)
Advanced Heart Failure Rounding Note  PCP-Cardiologist: Glori Bickers, MD   Subjective:    Had VT earlier this morning.   K 3.1 Mag 2.4.   Over night SOB at rest. Anxious about having another episode over night.    Objective:   Weight Range: 83 kg Body mass index is 31.41 kg/m.   Vital Signs:   Temp:  [97.4 F (36.3 C)-97.9 F (36.6 C)] 97.4 F (36.3 C) (02/26 0801) Pulse Rate:  [30-141] 42 (02/26 0801) Resp:  [0-29] 20 (02/26 0801) BP: (82-150)/(51-137) 101/70 (02/26 0801) SpO2:  [83 %-100 %] 96 % (02/26 0801) Weight:  [82.1 kg-83 kg] 83 kg (02/26 0455) Last BM Date: 04/27/19  Weight change: Filed Weights   04/28/19 0254 04/28/19 1648 04/29/19 0455  Weight: 81.9 kg 82.1 kg 83 kg    Intake/Output:   Intake/Output Summary (Last 24 hours) at 04/29/2019 1139 Last data filed at 04/29/2019 0850 Gross per 24 hour  Intake 1935.47 ml  Output 1075 ml  Net 860.47 ml      Physical Exam    General:   No resp difficulty HEENT: normal Neck: supple.JVP 11-12 . Carotids 2+ bilat; no bruits. No lymphadenopathy or thryomegaly appreciated. Cor: PMI nondisplaced. Regular rate & rhythm. No rubs, gallops or murmurs. Lungs: clear Abdomen: soft, nontender, nondistended. No hepatosplenomegaly. No bruits or masses. Good bowel sounds. Extremities: no cyanosis, clubbing, rash, edema Neuro: alert & orientedx3, cranial nerves grossly intact. moves all 4 extremities w/o difficulty. Affect pleasant    Telemetry   NSR 80s. Early am and > 40 beats VT.   EKG    N/A  Labs    CBC Recent Labs    04/27/19 1907 04/28/19 0241  WBC 6.2 10.9*  NEUTROABS 4.5  --   HGB 13.3 11.9*  HCT 40.3 37.1  MCV 87.6 90.0  PLT 212 709   Basic Metabolic Panel Recent Labs    04/28/19 0241 04/29/19 0413  NA 135 135  K 3.6 3.1*  CL 97* 98  CO2 26 27  GLUCOSE 204* 154*  BUN 44* 35*  CREATININE 2.07* 1.67*  CALCIUM 8.6* 9.2  MG 2.3 2.4   Liver Function Tests No results for  input(s): AST, ALT, ALKPHOS, BILITOT, PROT, ALBUMIN in the last 72 hours. No results for input(s): LIPASE, AMYLASE in the last 72 hours. Cardiac Enzymes No results for input(s): CKTOTAL, CKMB, CKMBINDEX, TROPONINI in the last 72 hours.  BNP: BNP (last 3 results) Recent Labs    08/18/18 1308 02/22/19 1400  BNP 334.9* 218.2*    ProBNP (last 3 results) No results for input(s): PROBNP in the last 8760 hours.   D-Dimer No results for input(s): DDIMER in the last 72 hours. Hemoglobin A1C No results for input(s): HGBA1C in the last 72 hours. Fasting Lipid Panel No results for input(s): CHOL, HDL, LDLCALC, TRIG, CHOLHDL, LDLDIRECT in the last 72 hours. Thyroid Function Tests Recent Labs    04/28/19 0246  TSH 3.547    Other results:   Imaging    No results found.   Medications:     Scheduled Medications: . allopurinol  200 mg Oral QHS  . apixaban  5 mg Oral BID  . atorvastatin  80 mg Oral Daily  . cholecalciferol  5,000 Units Oral Daily  . digoxin  0.0625 mg Oral QODAY  . insulin aspart  0-9 Units Subcutaneous TID WC  . levothyroxine  25 mcg Oral QAC breakfast  . losartan  12.5 mg Oral Daily  .  sodium chloride flush  3 mL Intravenous Q12H    Infusions: . sodium chloride 10 mL/hr at 04/28/19 1600  . amiodarone 30 mg/hr (04/29/19 1053)    PRN Medications: sodium chloride, Melatonin, oxyCODONE-acetaminophen, sodium chloride flush    Assessment/Plan   1. VT - in setting of hypokalemia and advanced HF - now s/p DC-CV. Recurrent VT this morning.  - K low. Keep K > 4.0 Mg> 2.0 -continue IV amio load - Discuss with EP.   2. Chronic biventricular systolic HF due to iCM - TEE 2/21: EF 20-25% RV moderately down severe MR/TR - Recently NYHA IIIb-IV with multiple arrhythmias including new onset AF and recurrent VT.  -s/p Medtronic CRT-D, - Had LV lead revision in 9/19 and unable to place lead in CS so received HIS lead placement. Underwent lead revision  02/03/18 with cessation of HIS lead pacing and LV lead - Undergoing transplant eva at [facility]. I have d/w Dr. Radene Knee. Blood Type O  - Check another BMET.  - Volume status trending up. Restart lasix 80 mg po twice a day.  -Intolerant entresto and carvedilol due to hypotension.[name] stopped with worsening renal function.  - Continue losartan 12.5 daily.  - Continue digoxin 0.0625 daily.   3. CAD:  - Stable on cath 1/19 -no ischemic symptoms. - hs trop low level and flat, 29>>26 - Continue ASA/statin  4. Mitral regurgitation:  - S/p mitral valve annuloplasty with CABG in 2007. Moderate to severe MR on echo in 4/15. TEE (06/2013) mitral regurgitation appeared severe, anatomy of repaired valve does not look suitable for MV clipping and would be high risk for MV replacement.  - TEE 2/21: EF 20-25% RV moderately down severe MR/TR  5. 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 - Follows with [place] [facility] - baseline creatine ~ 1.7 - SCr 1.67 , stable.   6. Hypokalemia  -K 2.5 on admit>>had VT. Likely 2/2 metolazone  -3.1. Supp K - Mag 2.4  - no spiro w/ CKD   Length of Stay: 2  Amy Clegg, NP  04/29/2019, 11:39 AM  Advanced Heart Failure Team Pager [phone] (M-F; 7a - 4p)  Please contact [facility] for night-coverage after hours (4p -7a ) and weekends on [email or web address]  Patient seen and examined with the above-signed Advanced Practice Provider and/or Housestaff. I personally reviewed laboratory data, imaging studies and relevant notes. I independently examined the patient and formulated the important aspects of the plan. I have edited the note to reflect any of my changes or salient points. I have personally discussed the plan with the patient and/or family.  Recurrent VT this am. K 3.1. Broke spontaneously. Feels apprehensive. Volume climbing back up.   General:  Sitting up in bed. Uneasy .  HEENT: normal Neck: supple. JVP 7-8 Carotids 2+ bilat; no bruits. No  lymphadenopathy or thryomegaly appreciated. Cor: PMI nondisplaced. Regular rate & rhythm. 2/6 MR Lungs: clear Abdomen: soft, nontender, nondistended. No hepatosplenomegaly. No bruits or masses. Good bowel sounds. Extremities: no cyanosis, clubbing, rash, tr edema Neuro: alert & orientedx3, cranial nerves grossly intact. moves all 4 extremities w/o difficulty. Affect pleasant  She has had recurrent VT this am. Broke spontaneously. Will supp K aggressively. Restart diuretics. Add spiro.   I remain concern that she continues to deteriorate. Currently undergoing w/u for transplant at [facility]. I spoke with Dr. Radene Knee this am. We will watch at least another 24 hours. If more VT will transfer to [facility] to finish transplant w/u  as inpatient. If rhythm stabilizes may be able to be discharged and go down as outpatient.   Rhythm now appears to be NSR but p-waves not always clear. With recent AF will get ECG.  Glori Bickers, MD  4:47 PM

## 2019-04-30 LAB — GLUCOSE, CAPILLARY
Glucose-Capillary: 137 mg/dL — ABNORMAL HIGH (ref 70–99)
Glucose-Capillary: 152 mg/dL — ABNORMAL HIGH (ref 70–99)
Glucose-Capillary: 115 mg/dL — ABNORMAL HIGH (ref 70–99)
Glucose-Capillary: 162 mg/dL — ABNORMAL HIGH (ref 70–99)

## 2019-04-30 LAB — BASIC METABOLIC PANEL
Anion gap: 9 (ref 5–15)
BUN: 26 mg/dL — ABNORMAL HIGH (ref 8–23)
CO2: 27 mmol/L (ref 22–32)
Calcium: 8.9 mg/dL (ref 8.9–10.3)
Chloride: 101 mmol/L (ref 98–111)
Creatinine, Ser: 1.53 mg/dL — ABNORMAL HIGH (ref 0.44–1.00)
GFR calc Af Amer: 42 mL/min — ABNORMAL LOW (ref >60)
GFR calc non Af Amer: 36 mL/min — ABNORMAL LOW (ref >60)
Glucose, Bld: 138 mg/dL — ABNORMAL HIGH (ref 70–99)
Potassium: 3.6 mmol/L (ref 3.5–5.1)
Sodium: 137 mmol/L (ref 135–145)

## 2019-04-30 LAB — MAGNESIUM: Magnesium: 2.1 mg/dL (ref 1.7–2.4)

## 2019-04-30 MED ORDER — POTASSIUM CHLORIDE CRYS ER 20 MEQ PO TBCR
40.0000 meq | EXTENDED_RELEASE_TABLET | Freq: Two times a day (BID) | ORAL | Status: DC
  Administered 2019-04-30 (×2): 40 meq via ORAL
  Filled 2019-04-30 (×2): qty 2

## 2019-04-30 MED ORDER — POTASSIUM CHLORIDE CRYS ER 20 MEQ PO TBCR
40.0000 meq | EXTENDED_RELEASE_TABLET | Freq: Once | ORAL | Status: AC
  Administered 2019-04-30: 13:00:00 40 meq via ORAL
  Filled 2019-04-30: qty 2

## 2019-04-30 MED ORDER — FUROSEMIDE 10 MG/ML IJ SOLN
80.0000 mg | Freq: Two times a day (BID) | INTRAMUSCULAR | Status: DC
  Administered 2019-04-30 – 2019-05-02 (×4): 80 mg via INTRAVENOUS
  Filled 2019-04-30 (×4): qty 8

## 2019-04-30 MED ORDER — SPIRONOLACTONE 25 MG PO TABS
25.0000 mg | ORAL_TABLET | Freq: Every day | ORAL | Status: DC
  Administered 2019-05-01 – 2019-05-02 (×2): 25 mg via ORAL
  Filled 2019-04-30 (×2): qty 1

## 2019-04-30 NOTE — Progress Notes (Addendum)
Advanced Heart Failure Rounding Note  PCP-Cardiologist: Glori Bickers, MD   Subjective:    Had recurrent VT yesterday. Brief NSVT over night  Feels SOB. Lasix restarted yesterday for volume overload.  Weight unchanged + orthopnea No PND.   K 3.6 Mag 2.1.    Objective:   Weight Range: 83.4 kg Body mass index is 31.57 kg/m.   Vital Signs:   Temp:  [96.9 F (36.1 C)-98.1 F (36.7 C)] 96.9 F (36.1 C) (02/27 1114) Pulse Rate:  [52-97] 92 (02/27 1118) Resp:  [7-34] 14 (02/27 1118) BP: (86-107)/(63-79) 102/74 (02/27 1114) SpO2:  [90 %-100 %] 96 % (02/27 1118) Weight:  [83.4 kg] 83.4 kg (02/27 0450) Last BM Date: 04/29/19  Weight change: Filed Weights   04/28/19 1648 04/29/19 0455 04/30/19 0450  Weight: 82.1 kg 83 kg 83.4 kg    Intake/Output:   Intake/Output Summary (Last 24 hours) at 04/30/2019 1142 Last data filed at 04/30/2019 0807 Gross per 24 hour  Intake 1185.47 ml  Output 1050 ml  Net 135.47 ml      Physical Exam    General:  Well appearing. No resp difficulty HEENT: normal Neck: supple. JVP to ear Carotids 2+ bilat; no bruits. No lymphadenopathy or thryomegaly appreciated. Cor: PMI nondisplaced. Irregular + s3 2/6 TR/MTR Lungs: clear Abdomen: soft, nontender, nondistended. No hepatosplenomegaly. No bruits or masses. Good bowel sounds. Extremities: no cyanosis, clubbing, rash, edema Neuro: alert & orientedx3, cranial nerves grossly intact. moves all 4 extremities w/o difficulty. Affect pleasant   Telemetry   Probable AF with v pacing 80-90s brief NSVT. Personally reviewed   Labs    CBC Recent Labs    04/27/19 1907 04/28/19 0241  WBC 6.2 10.9*  NEUTROABS 4.5  --   HGB 13.3 11.9*  HCT 40.3 37.1  MCV 87.6 90.0  PLT 212 546   Basic Metabolic Panel Recent Labs    04/29/19 0413 04/29/19 0413 04/29/19 1339 04/30/19 0524  NA 135   < > 132* 137  K 3.1*   < > 3.9 3.6  CL 98   < > 99 101  CO2 27   < > 24 27  GLUCOSE 154*   < > 210*  138*  BUN 35*   < > 33* 26*  CREATININE 1.67*   < > 1.68* 1.53*  CALCIUM 9.2   < > 8.9 8.9  MG 2.4  --   --  2.1   < > = values in this interval not displayed.   Liver Function Tests No results for input(s): AST, ALT, ALKPHOS, BILITOT, PROT, ALBUMIN in the last 72 hours. No results for input(s): LIPASE, AMYLASE in the last 72 hours. Cardiac Enzymes No results for input(s): CKTOTAL, CKMB, CKMBINDEX, TROPONINI in the last 72 hours.  BNP: BNP (last 3 results) Recent Labs    08/18/18 1308 02/22/19 1400  BNP 334.9* 218.2*    ProBNP (last 3 results) No results for input(s): PROBNP in the last 8760 hours.   D-Dimer No results for input(s): DDIMER in the last 72 hours. Hemoglobin A1C No results for input(s): HGBA1C in the last 72 hours. Fasting Lipid Panel No results for input(s): CHOL, HDL, LDLCALC, TRIG, CHOLHDL, LDLDIRECT in the last 72 hours. Thyroid Function Tests Recent Labs    04/28/19 0246  TSH 3.547    Other results:   Imaging    No results found.   Medications:     Scheduled Medications: . allopurinol  200 mg Oral QHS  . apixaban  5 mg Oral BID  . atorvastatin  80 mg Oral Daily  . cholecalciferol  5,000 Units Oral Daily  . digoxin  0.0625 mg Oral QODAY  . furosemide  80 mg Oral BID  . insulin aspart  0-9 Units Subcutaneous TID WC  . levothyroxine  25 mcg Oral QAC breakfast  . losartan  12.5 mg Oral Daily  . sodium chloride flush  3 mL Intravenous Q12H  . spironolactone  12.5 mg Oral Daily    Infusions: . sodium chloride 10 mL/hr at 04/28/19 1600  . amiodarone 30 mg/hr (04/30/19 1126)    PRN Medications: sodium chloride, Melatonin, oxyCODONE-acetaminophen, sodium chloride flush    Assessment/Plan   1. VT - in setting of hypokalemia (K 2.5) and advanced HF - now s/p DC-CV. Recurrent VT 2/26 in setting of K 3.1. Brief NCVT overnight.  - Continue to supp K. [name] added yesterday. Increase to 25.  - Keep K > 4.0 Mg > 2.0 -continue IV amio    2. Chronic biventricular systolic HF due to iCM - TEE 2/21: EF 20-25% RV moderately down severe MR/TR - Recently NYHA IIIb-IV with multiple arrhythmias including new onset AF and recurrent VT.  -s/p Medtronic CRT-D, - Had LV lead revision in 9/19 and unable to place lead in CS so received HIS lead placement. Underwent lead revision 02/03/18 with cessation of HIS lead pacing and LV lead - Undergoing transplant eva at [facility]. Blood Type O  - Volume status trending up. More SOB. Restart IV lasix.  - Intolerant entresto and carvedilol due to hypotension. [name] restarted yesterday. Increase to 25 daily - Continue losartan 12.5 daily.  - Continue digoxin 0.0625 daily.  - I remain concerned that she continues to deteriorate. Currently undergoing w/u for transplant at [facility]. I spoke with Dr. Radene Knee on 2/26. We will watch at least another 24 hours. If more VT will transfer to [facility] to finish transplant w/u as inpatient. If rhythm and HF stabilize may be able to be discharged and go down as outpatient.    3. CAD:  - Stable on cath 1/19 -no s/s of ischemia currently  - hs trop low level and flat, 29>>26 - Continue ASA/statin  4. Mitral regurgitation:  - S/p mitral valve annuloplasty with CABG in 2007. Moderate to severe MR on echo in 4/15. TEE (06/2013) mitral regurgitation appeared severe, anatomy of repaired valve does not look suitable for MV clipping and would be high risk for MV replacement.  - TEE 2/21: EF 20-25% RV moderately down severe MR/TR  5. 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 - Follows with [place] [facility] - baseline creatine ~ 1.7 - SCr 1.67 , stable.   6. Hypokalemia  -K 2.5 on admit>>had VT. Likely 2/2 metolazone  - K 3.6 today Supp K - Mag 2.1 - increase spiro  7. PAF - suspect back in AF - does not tolerate well.  - has failed several recent DC-CVs despite amio support.  - interrogate ICD on Monday - on apixaban 5 bid  Length of Stay:  [place], MD  04/30/2019, 11:42 AM  Advanced Heart Failure Team Pager [phone] (M-F; 7a - 4p)  Please contact [facility] for night-coverage after hours (4p -7a ) and weekends on [email or web address]

## 2019-05-01 LAB — GLUCOSE, CAPILLARY
Glucose-Capillary: 151 mg/dL — ABNORMAL HIGH (ref 70–99)
Glucose-Capillary: 179 mg/dL — ABNORMAL HIGH (ref 70–99)
Glucose-Capillary: 92 mg/dL (ref 70–99)
Glucose-Capillary: 175 mg/dL — ABNORMAL HIGH (ref 70–99)

## 2019-05-01 LAB — BASIC METABOLIC PANEL
Anion gap: 6 (ref 5–15)
BUN: 26 mg/dL — ABNORMAL HIGH (ref 8–23)
CO2: 26 mmol/L (ref 22–32)
Calcium: 8.9 mg/dL (ref 8.9–10.3)
Chloride: 103 mmol/L (ref 98–111)
Creatinine, Ser: 1.35 mg/dL — ABNORMAL HIGH (ref 0.44–1.00)
GFR calc Af Amer: 49 mL/min — ABNORMAL LOW (ref >60)
GFR calc non Af Amer: 42 mL/min — ABNORMAL LOW (ref >60)
Glucose, Bld: 141 mg/dL — ABNORMAL HIGH (ref 70–99)
Potassium: 4.7 mmol/L (ref 3.5–5.1)
Sodium: 135 mmol/L (ref 135–145)

## 2019-05-01 MED ORDER — POTASSIUM CHLORIDE CRYS ER 20 MEQ PO TBCR
20.0000 meq | EXTENDED_RELEASE_TABLET | Freq: Two times a day (BID) | ORAL | Status: DC
  Administered 2019-05-01 – 2019-05-02 (×3): 20 meq via ORAL
  Filled 2019-05-01 (×3): qty 1

## 2019-05-01 MED ORDER — LEVOTHYROXINE SODIUM 25 MCG PO TABS
25.0000 ug | ORAL_TABLET | Freq: Every day | ORAL | Status: DC
  Administered 2019-05-02: 25 ug via ORAL
  Filled 2019-05-01: qty 1

## 2019-05-01 NOTE — Progress Notes (Signed)
Advanced Heart Failure Rounding Note  PCP-Cardiologist: Glori Bickers, MD   Subjective:    Had recurrent VT on 2/26. None overnight  Remains on IV lasix. Minimal diuresis. Weigh unchanged. Remains SOB.   K 4.7    Objective:   Weight Range: 83.5 kg Body mass index is 31.58 kg/m.   Vital Signs:   Temp:  [96.9 F (36.1 C)-98 F (36.7 C)] 97.4 F (36.3 C) (02/28 0800) Pulse Rate:  [69-103] 85 (02/28 0800) Resp:  [11-26] 19 (02/28 0800) BP: (85-107)/(55-80) 96/73 (02/28 0800) SpO2:  [90 %-100 %] 93 % (02/28 0800) Weight:  [83.5 kg] 83.5 kg (02/28 0500) Last BM Date: 04/29/19  Weight change: Filed Weights   04/29/19 0455 04/30/19 0450 05/01/19 0500  Weight: 83 kg 83.4 kg 83.5 kg    Intake/Output:   Intake/Output Summary (Last 24 hours) at 05/01/2019 1043 Last data filed at 05/01/2019 0852 Gross per 24 hour  Intake 1716.06 ml  Output 1150 ml  Net 566.06 ml      Physical Exam    General:  Sitting up in bed . No resp difficulty HEENT: normal Neck: supple. JVP to ear  Carotids 2+ bilat; no bruits. No lymphadenopathy or thryomegaly appreciated. Cor: PMI nondisplaced. Regular rate & rhythm. +s3 2/6 MT/TR Lungs: clear Abdomen: soft, nontender, nondistended. No hepatosplenomegaly. No bruits or masses. Good bowel sounds. Extremities: no cyanosis, clubbing, rash, edema Neuro: alert & orientedx3, cranial nerves grossly intact. moves all 4 extremities w/o difficulty. Affect pleasant   Telemetry   Probable AF with intermittent  v pacing 80-90s. Personally reviewed   Labs    CBC No results for input(s): WBC, NEUTROABS, HGB, HCT, MCV, PLT in the last 72 hours. Basic Metabolic Panel Recent Labs    04/29/19 0413 04/29/19 1339 04/30/19 0524 05/01/19 0324  NA 135   < > 137 135  K 3.1*   < > 3.6 4.7  CL 98   < > 101 103  CO2 27   < > 27 26  GLUCOSE 154*   < > 138* 141*  BUN 35*   < > 26* 26*  CREATININE 1.67*   < > 1.53* 1.35*  CALCIUM 9.2   < > 8.9 8.9   MG 2.4  --  2.1  --    < > = values in this interval not displayed.   Liver Function Tests No results for input(s): AST, ALT, ALKPHOS, BILITOT, PROT, ALBUMIN in the last 72 hours. No results for input(s): LIPASE, AMYLASE in the last 72 hours. Cardiac Enzymes No results for input(s): CKTOTAL, CKMB, CKMBINDEX, TROPONINI in the last 72 hours.  BNP: BNP (last 3 results) Recent Labs    08/18/18 1308 02/22/19 1400  BNP 334.9* 218.2*    ProBNP (last 3 results) No results for input(s): PROBNP in the last 8760 hours.   D-Dimer No results for input(s): DDIMER in the last 72 hours. Hemoglobin A1C No results for input(s): HGBA1C in the last 72 hours. Fasting Lipid Panel No results for input(s): CHOL, HDL, LDLCALC, TRIG, CHOLHDL, LDLDIRECT in the last 72 hours. Thyroid Function Tests No results for input(s): TSH, T4TOTAL, T3FREE, THYROIDAB in the last 72 hours.  Invalid input(s): FREET3  Other results:   Imaging    No results found.   Medications:     Scheduled Medications: . allopurinol  200 mg Oral QHS  . apixaban  5 mg Oral BID  . atorvastatin  80 mg Oral Daily  . cholecalciferol  5,000 Units  Oral Daily  . digoxin  0.0625 mg Oral QODAY  . furosemide  80 mg Intravenous BID  . insulin aspart  0-9 Units Subcutaneous TID WC  . [START ON 05/02/2019] levothyroxine  25 mcg Oral Q0600  . losartan  12.5 mg Oral Daily  . potassium chloride  20 mEq Oral BID  . sodium chloride flush  3 mL Intravenous Q12H  . spironolactone  25 mg Oral Daily    Infusions: . sodium chloride 10 mL/hr at 04/28/19 1600  . amiodarone 30 mg/hr (05/01/19 0700)    PRN Medications: sodium chloride, Melatonin, oxyCODONE-acetaminophen, sodium chloride flush    Assessment/Plan   1. VT - in setting of hypokalemia (K 2.5) and advanced HF - now s/p DC-CV. Recurrent VT 2/26 in setting of K 3.1. Brief NCVT overnight.  - Continue to supp K. [name] added yesterday. Increase to 25.  - Keep K > 4.0 Mg  > 2.0 -continue IV amio   2. 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  -s/p Medtronic CRT-D, - Had LV lead revision in 9/19 and unable to place lead in CS so received HIS lead placement. Underwent lead revision 02/03/18 with cessation of HIS lead pacing and LV lead - Undergoing transplant eva at [facility]. Blood Type O  - Volume status remains elevated despite IV lasix. Now that K is up will give one dose metolazone - Intolerant entresto and carvedilol due to hypotension. [name] restarted. Continbue 25 daily - Continue losartan 12.5 daily.  - Continue digoxin 0.0625 daily.  - I remain concerned that she continues to deteriorate. Currently undergoing w/u for transplant at [facility]. I spoke with Dr. Radene Knee on 2/26. We will watch at least another 24 hours. If more VT or HF mains difficult to control will transfer to [facility] to finish transplant w/u as inpatient. If rhythm and HF stabilize may be able to be discharged and go down as outpatient.    3. CAD:  - Stable on cath 1/19 -no s/s of ischemia currently - hs trop low level and flat, 29>>26 - Continue ASA/statin  4. Mitral regurgitation:  - S/p mitral valve annuloplasty with CABG in 2007. Moderate to severe MR on echo in 4/15. TEE (06/2013) mitral regurgitation appeared severe, anatomy of repaired valve does not look suitable for MV clipping and would be high risk for MV replacement.  - TEE 2/21: EF 20-25% RV moderately down severe MR/TR  5. 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 - Follows with [facility] - baseline creatine ~ 1.7 - SCr 1.35 , stable.   6. Hypokalemia  -K 2.5 on admit>>had VT. Likely 2/2 metolazone  - K 4.7 today. Spiro increased. Can decrease K supp today. Watch closely with meolazone - Mag 2.1  7. PAF - suspect back in AF - does not  tolerate well.  - has failed several recent DC-CVs despite amio support.  - interrogate ICD in am.  Will keep NPO  - on apixaban 5 bid  Length of Stay: [place], MD  05/01/2019, 10:43 AM  Advanced Heart Failure Team Pager [phone] (M-F; 7a - 4p)  Please contact [facility] for night-coverage after hours (4p -7a ) and weekends on [email or web address]

## 2019-05-02 DIAGNOSIS — Z87891 Personal history of nicotine dependence: Secondary | ICD-10-CM | POA: Diagnosis not present

## 2019-05-02 DIAGNOSIS — J90 Pleural effusion, not elsewhere classified: Secondary | ICD-10-CM | POA: Diagnosis not present

## 2019-05-02 DIAGNOSIS — I130 Hypertensive heart and chronic kidney disease with heart failure and stage 1 through stage 4 chronic kidney disease, or unspecified chronic kidney disease: Secondary | ICD-10-CM | POA: Diagnosis not present

## 2019-05-02 DIAGNOSIS — N170 Acute kidney failure with tubular necrosis: Secondary | ICD-10-CM | POA: Diagnosis not present

## 2019-05-02 DIAGNOSIS — N189 Chronic kidney disease, unspecified: Secondary | ICD-10-CM | POA: Diagnosis not present

## 2019-05-02 DIAGNOSIS — K8020 Calculus of gallbladder without cholecystitis without obstruction: Secondary | ICD-10-CM | POA: Diagnosis not present

## 2019-05-02 DIAGNOSIS — E876 Hypokalemia: Secondary | ICD-10-CM | POA: Diagnosis present

## 2019-05-02 DIAGNOSIS — R0902 Hypoxemia: Secondary | ICD-10-CM | POA: Diagnosis not present

## 2019-05-02 DIAGNOSIS — I5021 Acute systolic (congestive) heart failure: Secondary | ICD-10-CM | POA: Diagnosis not present

## 2019-05-02 DIAGNOSIS — I447 Left bundle-branch block, unspecified: Secondary | ICD-10-CM | POA: Diagnosis not present

## 2019-05-02 DIAGNOSIS — G4730 Sleep apnea, unspecified: Secondary | ICD-10-CM | POA: Diagnosis not present

## 2019-05-02 DIAGNOSIS — I5020 Unspecified systolic (congestive) heart failure: Secondary | ICD-10-CM | POA: Diagnosis not present

## 2019-05-02 DIAGNOSIS — G4739 Other sleep apnea: Secondary | ICD-10-CM | POA: Diagnosis not present

## 2019-05-02 DIAGNOSIS — C3490 Malignant neoplasm of unspecified part of unspecified bronchus or lung: Secondary | ICD-10-CM | POA: Diagnosis not present

## 2019-05-02 DIAGNOSIS — I340 Nonrheumatic mitral (valve) insufficiency: Secondary | ICD-10-CM | POA: Diagnosis not present

## 2019-05-02 DIAGNOSIS — R55 Syncope and collapse: Secondary | ICD-10-CM | POA: Diagnosis not present

## 2019-05-02 DIAGNOSIS — I472 Ventricular tachycardia: Secondary | ICD-10-CM | POA: Diagnosis not present

## 2019-05-02 DIAGNOSIS — D849 Immunodeficiency, unspecified: Secondary | ICD-10-CM | POA: Diagnosis present

## 2019-05-02 DIAGNOSIS — R918 Other nonspecific abnormal finding of lung field: Secondary | ICD-10-CM | POA: Diagnosis not present

## 2019-05-02 DIAGNOSIS — Z951 Presence of aortocoronary bypass graft: Secondary | ICD-10-CM | POA: Diagnosis not present

## 2019-05-02 DIAGNOSIS — Z95810 Presence of automatic (implantable) cardiac defibrillator: Secondary | ICD-10-CM | POA: Diagnosis not present

## 2019-05-02 DIAGNOSIS — E861 Hypovolemia: Secondary | ICD-10-CM | POA: Diagnosis not present

## 2019-05-02 DIAGNOSIS — R0602 Shortness of breath: Secondary | ICD-10-CM | POA: Diagnosis not present

## 2019-05-02 DIAGNOSIS — I739 Peripheral vascular disease, unspecified: Secondary | ICD-10-CM | POA: Diagnosis not present

## 2019-05-02 DIAGNOSIS — R0789 Other chest pain: Secondary | ICD-10-CM | POA: Diagnosis not present

## 2019-05-02 DIAGNOSIS — N179 Acute kidney failure, unspecified: Secondary | ICD-10-CM | POA: Diagnosis not present

## 2019-05-02 DIAGNOSIS — R911 Solitary pulmonary nodule: Secondary | ICD-10-CM | POA: Diagnosis not present

## 2019-05-02 DIAGNOSIS — I129 Hypertensive chronic kidney disease with stage 1 through stage 4 chronic kidney disease, or unspecified chronic kidney disease: Secondary | ICD-10-CM | POA: Diagnosis not present

## 2019-05-02 DIAGNOSIS — E1122 Type 2 diabetes mellitus with diabetic chronic kidney disease: Secondary | ICD-10-CM | POA: Diagnosis not present

## 2019-05-02 DIAGNOSIS — R300 Dysuria: Secondary | ICD-10-CM | POA: Diagnosis not present

## 2019-05-02 DIAGNOSIS — J811 Chronic pulmonary edema: Secondary | ICD-10-CM | POA: Diagnosis not present

## 2019-05-02 DIAGNOSIS — E1165 Type 2 diabetes mellitus with hyperglycemia: Secondary | ICD-10-CM | POA: Diagnosis not present

## 2019-05-02 DIAGNOSIS — I4891 Unspecified atrial fibrillation: Secondary | ICD-10-CM | POA: Diagnosis not present

## 2019-05-02 DIAGNOSIS — I2510 Atherosclerotic heart disease of native coronary artery without angina pectoris: Secondary | ICD-10-CM | POA: Diagnosis not present

## 2019-05-02 DIAGNOSIS — N390 Urinary tract infection, site not specified: Secondary | ICD-10-CM | POA: Diagnosis not present

## 2019-05-02 DIAGNOSIS — I519 Heart disease, unspecified: Secondary | ICD-10-CM | POA: Diagnosis not present

## 2019-05-02 DIAGNOSIS — I081 Rheumatic disorders of both mitral and tricuspid valves: Secondary | ICD-10-CM | POA: Diagnosis not present

## 2019-05-02 DIAGNOSIS — I252 Old myocardial infarction: Secondary | ICD-10-CM | POA: Diagnosis not present

## 2019-05-02 DIAGNOSIS — D508 Other iron deficiency anemias: Secondary | ICD-10-CM | POA: Diagnosis not present

## 2019-05-02 DIAGNOSIS — J95811 Postprocedural pneumothorax: Secondary | ICD-10-CM | POA: Diagnosis not present

## 2019-05-02 DIAGNOSIS — Z01818 Encounter for other preprocedural examination: Secondary | ICD-10-CM | POA: Diagnosis not present

## 2019-05-02 DIAGNOSIS — I5022 Chronic systolic (congestive) heart failure: Secondary | ICD-10-CM | POA: Diagnosis not present

## 2019-05-02 DIAGNOSIS — I480 Paroxysmal atrial fibrillation: Secondary | ICD-10-CM | POA: Diagnosis present

## 2019-05-02 DIAGNOSIS — I5023 Acute on chronic systolic (congestive) heart failure: Secondary | ICD-10-CM | POA: Diagnosis not present

## 2019-05-02 DIAGNOSIS — L292 Pruritus vulvae: Secondary | ICD-10-CM | POA: Diagnosis not present

## 2019-05-02 DIAGNOSIS — J939 Pneumothorax, unspecified: Secondary | ICD-10-CM | POA: Diagnosis not present

## 2019-05-02 DIAGNOSIS — E8770 Fluid overload, unspecified: Secondary | ICD-10-CM | POA: Diagnosis not present

## 2019-05-02 DIAGNOSIS — C3411 Malignant neoplasm of upper lobe, right bronchus or lung: Secondary | ICD-10-CM | POA: Diagnosis not present

## 2019-05-02 DIAGNOSIS — Z20822 Contact with and (suspected) exposure to covid-19: Secondary | ICD-10-CM | POA: Diagnosis present

## 2019-05-02 DIAGNOSIS — I272 Pulmonary hypertension, unspecified: Secondary | ICD-10-CM | POA: Diagnosis present

## 2019-05-02 DIAGNOSIS — E039 Hypothyroidism, unspecified: Secondary | ICD-10-CM | POA: Diagnosis not present

## 2019-05-02 DIAGNOSIS — Z4682 Encounter for fitting and adjustment of non-vascular catheter: Secondary | ICD-10-CM | POA: Diagnosis not present

## 2019-05-02 DIAGNOSIS — G8918 Other acute postprocedural pain: Secondary | ICD-10-CM | POA: Diagnosis not present

## 2019-05-02 DIAGNOSIS — I469 Cardiac arrest, cause unspecified: Secondary | ICD-10-CM | POA: Diagnosis not present

## 2019-05-02 DIAGNOSIS — R160 Hepatomegaly, not elsewhere classified: Secondary | ICD-10-CM | POA: Diagnosis not present

## 2019-05-02 DIAGNOSIS — Z452 Encounter for adjustment and management of vascular access device: Secondary | ICD-10-CM | POA: Diagnosis not present

## 2019-05-02 DIAGNOSIS — R0989 Other specified symptoms and signs involving the circulatory and respiratory systems: Secondary | ICD-10-CM | POA: Diagnosis not present

## 2019-05-02 DIAGNOSIS — C3491 Malignant neoplasm of unspecified part of right bronchus or lung: Secondary | ICD-10-CM | POA: Diagnosis not present

## 2019-05-02 DIAGNOSIS — Z008 Encounter for other general examination: Secondary | ICD-10-CM | POA: Diagnosis not present

## 2019-05-02 DIAGNOSIS — E119 Type 2 diabetes mellitus without complications: Secondary | ICD-10-CM | POA: Diagnosis not present

## 2019-05-02 DIAGNOSIS — I517 Cardiomegaly: Secondary | ICD-10-CM | POA: Diagnosis not present

## 2019-05-02 DIAGNOSIS — R82998 Other abnormal findings in urine: Secondary | ICD-10-CM | POA: Diagnosis not present

## 2019-05-02 DIAGNOSIS — B961 Klebsiella pneumoniae [K. pneumoniae] as the cause of diseases classified elsewhere: Secondary | ICD-10-CM | POA: Diagnosis not present

## 2019-05-02 DIAGNOSIS — Z794 Long term (current) use of insulin: Secondary | ICD-10-CM | POA: Diagnosis not present

## 2019-05-02 DIAGNOSIS — N183 Chronic kidney disease, stage 3 unspecified: Secondary | ICD-10-CM | POA: Diagnosis not present

## 2019-05-02 DIAGNOSIS — T797XXA Traumatic subcutaneous emphysema, initial encounter: Secondary | ICD-10-CM | POA: Diagnosis not present

## 2019-05-02 DIAGNOSIS — E1151 Type 2 diabetes mellitus with diabetic peripheral angiopathy without gangrene: Secondary | ICD-10-CM | POA: Diagnosis present

## 2019-05-02 DIAGNOSIS — G4733 Obstructive sleep apnea (adult) (pediatric): Secondary | ICD-10-CM | POA: Diagnosis not present

## 2019-05-02 DIAGNOSIS — N200 Calculus of kidney: Secondary | ICD-10-CM | POA: Diagnosis not present

## 2019-05-02 DIAGNOSIS — E871 Hypo-osmolality and hyponatremia: Secondary | ICD-10-CM | POA: Diagnosis not present

## 2019-05-02 DIAGNOSIS — I5084 End stage heart failure: Secondary | ICD-10-CM | POA: Diagnosis not present

## 2019-05-02 DIAGNOSIS — I255 Ischemic cardiomyopathy: Secondary | ICD-10-CM | POA: Diagnosis not present

## 2019-05-02 LAB — GLUCOSE, CAPILLARY
Glucose-Capillary: 145 mg/dL — ABNORMAL HIGH (ref 70–99)
Glucose-Capillary: 163 mg/dL — ABNORMAL HIGH (ref 70–99)
Glucose-Capillary: 115 mg/dL — ABNORMAL HIGH (ref 70–99)

## 2019-05-02 LAB — BASIC METABOLIC PANEL
Anion gap: 11 (ref 5–15)
BUN: 22 mg/dL (ref 8–23)
CO2: 28 mmol/L (ref 22–32)
Calcium: 9.2 mg/dL (ref 8.9–10.3)
Chloride: 97 mmol/L — ABNORMAL LOW (ref 98–111)
Creatinine, Ser: 1.56 mg/dL — ABNORMAL HIGH (ref 0.44–1.00)
GFR calc Af Amer: 41 mL/min — ABNORMAL LOW (ref >60)
GFR calc non Af Amer: 35 mL/min — ABNORMAL LOW (ref >60)
Glucose, Bld: 151 mg/dL — ABNORMAL HIGH (ref 70–99)
Potassium: 4.3 mmol/L (ref 3.5–5.1)
Sodium: 136 mmol/L (ref 135–145)

## 2019-05-02 LAB — MAGNESIUM: Magnesium: 2.1 mg/dL (ref 1.7–2.4)

## 2019-05-02 SURGERY — CARDIOVERSION
Anesthesia: General

## 2019-05-02 MED ORDER — POTASSIUM CHLORIDE CRYS ER 20 MEQ PO TBCR: 20.0000 meq | EXTENDED_RELEASE_TABLET | Freq: Two times a day (BID) | ORAL | Status: AC

## 2019-05-02 MED ORDER — SPIRONOLACTONE 25 MG PO TABS: 25.0000 mg | ORAL_TABLET | Freq: Every day | ORAL | Status: AC

## 2019-05-02 MED ORDER — INSULIN ASPART 100 UNIT/ML ~~LOC~~ SOLN: 0.0000 [IU] | Freq: Three times a day (TID) | SUBCUTANEOUS | 11 refills | Status: AC

## 2019-05-02 MED ORDER — MELATONIN 3 MG PO TABS: 3.0000 mg | ORAL_TABLET | Freq: Every evening | ORAL | 0 refills | Status: AC | PRN

## 2019-05-02 MED ORDER — FUROSEMIDE 10 MG/ML IJ SOLN: 80.0000 mg | Freq: Two times a day (BID) | INTRAMUSCULAR | 0 refills | Status: AC

## 2019-05-02 MED ORDER — AMIODARONE HCL IN DEXTROSE 360-4.14 MG/200ML-% IV SOLN: 30.0000 mg/h | INTRAVENOUS | Status: AC

## 2019-05-02 NOTE — Progress Notes (Addendum)
Advanced Heart Failure Rounding Note  PCP-Cardiologist: Glori Bickers, MD   Subjective:    Had recurrent VT on 2/26. Back in a fib today. Remains on amio drip.   Yesterday given IV lasix 80 + metolazone 5 with minimal response.    SOB overnight. + orthopnea. Feels fatigued. Weight up 1 pound. Denies chest pain.    Objective:   Weight Range: 83.9 kg Body mass index is 31.76 kg/m.   Vital Signs:   Temp:  [97.6 F (36.4 C)-98 F (36.7 C)] 97.9 F (36.6 C) (03/01 0417) Pulse Rate:  [80-92] 92 (03/01 0800) Resp:  [0-23] 16 (03/01 0800) BP: (94-106)/(59-76) 102/71 (03/01 0800) SpO2:  [91 %-99 %] 98 % (03/01 0800) Weight:  [83.9 kg] 83.9 kg (03/01 0522) Last BM Date: 04/30/19  Weight change: Filed Weights   04/30/19 0450 05/01/19 0500 05/02/19 0522  Weight: 83.4 kg 83.5 kg 83.9 kg    Intake/Output:   Intake/Output Summary (Last 24 hours) at 05/02/2019 1111 Last data filed at 05/02/2019 0646 Gross per 24 hour  Intake 1363.64 ml  Output 1300 ml  Net 63.64 ml      Physical Exam   General:  No resp difficulty HEENT: normal Neck: supple. JVP 11-12. Carotids 2+ bilat; no bruits. No lymphadenopathy or thryomegaly appreciated. Cor: PMI nondisplaced. Irregular rate & rhythm. No rubs. + S3. 2/6 MR/TR. Lungs: clear Abdomen: soft, nontender, nondistended. No hepatosplenomegaly. No bruits or masses. Good bowel sounds. Extremities: no cyanosis, clubbing, rash, edema Neuro: alert & orientedx3, cranial nerves grossly intact. moves all 4 extremities w/o difficulty. Affect pleasant   Telemetry   A fib 80-90s intermittent pacing.    Labs    CBC No results for input(s): WBC, NEUTROABS, HGB, HCT, MCV, PLT in the last 72 hours. Basic Metabolic Panel Recent Labs    04/30/19 0524 04/30/19 0524 05/01/19 0324 05/02/19 0332  NA 137   < > 135 136  K 3.6   < > 4.7 4.3  CL 101   < > 103 97*  CO2 27   < > 26 28  GLUCOSE 138*   < > 141* 151*  BUN 26*   < > 26* 22    CREATININE 1.53*   < > 1.35* 1.56*  CALCIUM 8.9   < > 8.9 9.2  MG 2.1  --   --   --    < > = values in this interval not displayed.   Liver Function Tests No results for input(s): AST, ALT, ALKPHOS, BILITOT, PROT, ALBUMIN in the last 72 hours. No results for input(s): LIPASE, AMYLASE in the last 72 hours. Cardiac Enzymes No results for input(s): CKTOTAL, CKMB, CKMBINDEX, TROPONINI in the last 72 hours.  BNP: BNP (last 3 results) Recent Labs    08/18/18 1308 02/22/19 1400  BNP 334.9* 218.2*    ProBNP (last 3 results) No results for input(s): PROBNP in the last 8760 hours.   D-Dimer No results for input(s): DDIMER in the last 72 hours. Hemoglobin A1C No results for input(s): HGBA1C in the last 72 hours. Fasting Lipid Panel No results for input(s): CHOL, HDL, LDLCALC, TRIG, CHOLHDL, LDLDIRECT in the last 72 hours. Thyroid Function Tests No results for input(s): TSH, T4TOTAL, T3FREE, THYROIDAB in the last 72 hours.  Invalid input(s): FREET3  Other results:   Imaging    No results found.   Medications:     Scheduled Medications: . allopurinol  200 mg Oral QHS  . apixaban  5 mg Oral  BID  . atorvastatin  80 mg Oral Daily  . cholecalciferol  5,000 Units Oral Daily  . digoxin  0.0625 mg Oral QODAY  . furosemide  80 mg Intravenous BID  . insulin aspart  0-9 Units Subcutaneous TID WC  . levothyroxine  25 mcg Oral Q0600  . losartan  12.5 mg Oral Daily  . potassium chloride  20 mEq Oral BID  . sodium chloride flush  3 mL Intravenous Q12H  . spironolactone  25 mg Oral Daily    Infusions: . sodium chloride 10 mL/hr at 04/28/19 1600  . amiodarone 30 mg/hr (05/02/19 1108)    PRN Medications: sodium chloride, Melatonin, oxyCODONE-acetaminophen, sodium chloride flush    Assessment/Plan   1. VT - in setting of hypokalemia (K 2.5) and advanced HF - now s/p DC-CV. Recurrent VT 2/26 in setting of K 3.1. Brief NCVT overnight.  - Continue to supp K. Continue  spiro.   - Keep K > 4.0 Mg > 2.0. Mag pending.  -continue IV amio   2. 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  -s/p Medtronic CRT-D, - Had LV lead revision in 9/19 and unable to place lead in CS so received HIS lead placement. Underwent lead revision 02/03/18 with cessation of HIS lead pacing and LV lead - Undergoing transplant eva at [facility]. Blood Type O  - Volume status elevated. Give 80 mg IV lasix x2.  - Intolerant entresto and carvedilol due to hypotension. - Continue 25 mg spironolactone daily.  - Continue losartan 12.5 daily.  - Continue digoxin 0.0625 daily.  - Currently undergoing w/u for transplant at [facility].  If more VT or HF mains difficult to control will transfer to [facility] to finish transplant work up as inpatient. If rhythm and HF stabilize may be able to be discharged and go down as outpatient.    3. CAD:  - Stable on cath 1/19 -no s/s of ischemia currently - hs trop low level and flat, 29>>26 - Continue ASA/statin  4. Mitral regurgitation:  - S/p mitral valve annuloplasty with CABG in 2007. Moderate to severe MR on echo in 4/15. TEE (06/2013) mitral regurgitation appeared severe, anatomy of repaired valve does not look suitable for MV clipping and would be high risk for MV replacement.  - TEE 2/21: EF 20-25% RV moderately down severe MR/TR  5. 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 - Follows with [facility] - baseline creatine ~ 1.7 - SCr 1.56 today, stable.   6. Hypokalemia  -K 2.5 on admit>>had VT. Likely 2/2 metolazone  - K 4.3 today  7. PAF - Back in a fib. does not tolerate well. Continue amio drip  - has failed several recent DC-CVs despite amio support.  -. Possible cardioversion today.  NPO - on apixaban 5 bid  Length of Stay: 5  Amy Clegg, NP  05/02/2019, 11:11  AM  Advanced Heart Failure Team Pager [phone] (M-F; [place])  Please contact [facility] for night-coverage after hours (4p -7a ) and weekends on [email or web address]  Patient seen and examined with the above-signed Advanced Practice Provider and/or Housestaff. I personally reviewed laboratory data, imaging studies and relevant notes. I independently examined the patient and formulated the important aspects of the plan. I have edited the note to reflect any of my changes or salient points.  I have personally discussed the plan with the patient and/or family.  She remains tenuous. Volume overloaded on exam but not responding to IV lasix and metolazone. Suspect diuresis limited by recurrent AF and potential low output. No further VT  General:  Sitting up in bed. Weak appearing. No resp difficulty HEENT: normal Neck: supple. JVP to ear with prominent v-waves . Carotids 2+ bilat; no bruits. No lymphadenopathy or thryomegaly appreciated. Cor: PMI nondisplaced. irregular rate & rhythm. 2/6 MR and TR Lungs: clear Abdomen: soft, nontender, nondistended. No hepatosplenomegaly. No bruits or masses. Good bowel sounds. Extremities: no cyanosis, clubbing, rash, edema Neuro: alert & orientedx3, cranial nerves grossly intact. moves all 4 extremities w/o difficulty. Affect pleasant   I remain concerned about overall deterioration of her HF. With recent AF, VT and difficulty maintaining volume status.   I have d/w Drs. Chang and Pandora at [facility] for further treatment of her HF and to complete transplant w/u. Suspect she may need repeat DC-CV and possible inotrope support. She is Blood Type O. If not candidate for transplant listing we would be happy to accept her back for VAD work-up.   Glori Bickers, MD  3:04 PM

## 2019-05-02 NOTE — Progress Notes (Signed)
Patient transported to [facility] per order, reports given to receiving nurse there. Patient is alert and oriented, vitals WDL. Transported with amiodarone gtt at 30 mg/hr: 16 mL/hr. No family at bedside, but per patient her sister lives in [place] and she is aware of her been transported.   From what I was told in report, the receiving Dr. Is Eda Paschal.

## 2019-05-02 NOTE — Discharge Summary (Addendum)
Advanced Heart Failure Team  Discharge Summary   Patient ID: [name] MRN: [id number], DOB/AGE: February 09, 1956 64 y.o. Admit date: 04/27/2019 D/C date:     05/02/2019   Primary Discharge Diagnoses:  1. Ventricular tachycardia 2. A/C Biventricular HF due to ICM 3. CAD 4. Mitral Regurgitation  5. A/C CKD Stage IIIb 6. Hypokalemia  7. Persistent atrial fibrillation    Hospital Course:  [name] 64 yo female history of HTN, DM2, CAD with 5 vessel CABG and MV annuloplasty in 2007, CRT-D, chronic systolic HF 77-03%, CKD 3, PAD.  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 At time of cath LAD, LCX and RCA occluded. EF 45%. Had PCI of LAD followed by abrupt stent occlusion. Had repeat PCI of LAD and then underwent CABG x 5 with mitral valve annuloplasty in 2007.   Amitted 8/15 for syncope s/p ICD shock. Concern that VT was possibly from ischemia and taken for [facility]. Started on Amiodarone. Cardiac output dropped while in hospital so started on milrinone. Discharged home. Underwent CRT-D upgrade on 12/02/13. Milrinone eventually weaned off.   Has done relatively well over past 5 years. Followed by Dr. Radene Knee for possible Transplant need but has been stable.   Developed new onset AF in 1/21 which was not tolerated well. Had TEE DC-CV on 04/01/19. EF 20% with moderate RV HK, Severe MR/TR. Reverted to AF and saw EP . Amio increased. Presented for repeat DC-CV on 04/15/19 but was back in NSR. Feeling poorly so underwent RHC on 04/15/19  RA = 14 RV = 67/13 PA = 66/28 (43) PCW = 25 (v-waves to 44) Fick cardiac output/index = 4.2/2.2 PVR = 4.3 WU Ao sat = 98% PA sat = 63%, 63% PAPi = 2.7 RA/PCWP = 0.56  Admitted on 04/15/19 from cath lab with elevated filling pressures.  Diuresed with IV lasix then transitioned to lasix 80 mg twice a day. Discharge weight 179 pounds. Later that week she had follow up testing at [facility] for heart/kidney transplant. Had  repeat RHC showing recurrent volume overload. .  She returned to HF clinic on 04/26/19 and was back in A fib. Amiodarone was increased to 400 mg twice a day and she was set for an elective cardioversion. Later that night she had presyncope and called 911. She was transferred to [facility] ED. In the ED she developed VT with loss of consciousness and underwent cardioversion with restoration of NSR. Had brief CPR. VT was thought to be in the setting of hypokalemia. Electrolytes supplemented and started on amiodarone drip.   Unfortunately she went back in A Fib and developed recurrent volume overload. Diuresed with IV lasix + metolazone but had poor response.  Also had repeat episode ov VT which broke spontaneously. Renal function followed closely and remained stable. Cardioversion was considered but given worsening heart failure transfer to [facility] was pursued.   Dr Haroldine Laws discussed with Dr Radene Knee and she was accepted at [facility] for ongoing advanced heart failure and transplant evaluation.   See below for detailed problem list.   1. VT - in setting of hypokalemia (K 2.5) and advanced HF - Recurrent VT 2/26 in setting of K 3.1. Having ongoing episodes of NSVT.  - Continue to supp K. Continue spiro.   - Keep K > 4.0 Mg > 2.0. Mag pending.  -continue IV amio   2. Acute on chronic biventricularsystolicHFdue to iCM - TEE 2/21: EF 20-25% RV moderately down severe MR/TR -  Recently NYHA IIIb-IV with multiple arrhythmias including new onset AF and recurrent VT.  -s/p Medtronic CRT-D, - Had LV lead revision in 9/19 and unable to place lead in CS so received HIS lead placement. Underwent lead revision 02/03/18 with cessation of HIS lead pacing and LV lead - Undergoing transplant evaluated at [facility]. Blood Type O - Volume status elevated. Diuresed with IV lasix + metolazone.   - Intolerant entresto and carvedilol due to hypotension. - Continue 25 mg spironolactone daily.  - Continue losartan 12.5 daily.   - Continue digoxin 0.0625 daily.   3. CAD:  - Stable on cath 1/19 -nos/s of ischemia currently - hs trop low level and flat, 29>>26 - Continue ASA/statin  4. Mitral regurgitation:  - S/p mitral valve annuloplasty with CABG in 2007. Moderate to severe MR on echo in 4/15. TEE (06/2013) mitral regurgitation appeared severe, anatomy of repaired valve does not look suitable for MV clipping and would be high risk for MV replacement. 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 - Follows with [facility] - baseline creatine ~ 1.7 - SCr 1.56 today, stable.   6. Hypokalemia  -K 2.5 on admit>>had VT. Likely 2/2 metolazone  - K 4.3 today  7. PAF - Back in a fib. does not tolerate well. Continue amio drip  - has failed several recent DC-CVs despite amio support.  - Considered another cardioversion but [facility] plans to evaluate.  - on apixaban 5 bid  Discharge Vitals: Blood pressure 102/71, pulse 92, temperature 97.9 F (36.6 C), temperature source Oral, resp. rate 16, height _0  (1.626 m), weight 83.9 kg, SpO2 98 %.  Labs: Lab Results  Component Value Date   WBC 10.9 (H) 04/28/2019   HGB 11.9 (L) 04/28/2019   HCT 37.1 04/28/2019   MCV 90.0 04/28/2019   PLT 183 04/28/2019    Recent Labs  Lab 05/02/19 0332  NA 136  K 4.3  CL 97*  CO2 28  BUN 22  CREATININE 1.56*  CALCIUM 9.2  GLUCOSE 151*   Lab Results  Component Value Date   CHOL 223 (H) 08/30/2014   HDL 36.40 (L) 06/07/2013   LDLCALC 82 06/07/2013   TRIG 106.0 06/07/2013   BNP (last 3 results) Recent Labs    08/18/18 1308 02/22/19 1400  BNP 334.9* 218.2*    ProBNP (last 3 results) No results for input(s): PROBNP in the last 8760 hours.   Diagnostic Studies/Procedures   No results found.  Discharge Medications   Allergies as of 05/02/2019   No Known Allergies     Medication List    STOP taking these medications    Accu-Chek FastClix Lancets Misc   Accu-Chek Nano SmartView w/Device Kit   Accu-Chek SmartView test strip Generic drug: glucose blood   amiodarone 400 MG tablet Commonly known as: PACERONE   amoxicillin 500 MG capsule Commonly known as: AMOXIL   furosemide 80 MG tablet Commonly known as: LASIX Replaced by: furosemide 10 MG/ML injection   Insulin Pen Needle 32G X 4 MM Misc   Invokana 100 MG Tabs tablet Generic drug: canagliflozin   Melatonin 10 MG Caps Replaced by: Melatonin 3 MG Tabs   metolazone 2.5 MG tablet Commonly known as: ZAROXOLYN   nitroGLYCERIN 0.4 MG SL tablet Commonly known as: NITROSTAT   Rybelsus 7 MG Tabs Generic drug: Semaglutide     TAKE these medications   acetaminophen 500 MG  tablet Commonly known as: TYLENOL Take 1,000 mg by mouth every 6 (six) hours as needed for moderate pain or headache.   allopurinol 100 MG tablet Commonly known as: ZYLOPRIM Take 200 mg by mouth at bedtime.   amiodarone 360-4.14 MG/200ML-% Soln Commonly known as: NEXTERONE PREMIX Inject 30 mg/hr into the vein continuous.   apixaban 5 MG Tabs tablet Commonly known as: Eliquis Take 1 tablet (5 mg total) by mouth 2 (two) times daily.   atorvastatin 80 MG tablet Commonly known as: LIPITOR Take 1 tablet by mouth once daily   digoxin 0.25 MG tablet Commonly known as: LANOXIN Take 0.0625 mg by mouth every other day.   furosemide 10 MG/ML injection Commonly known as: LASIX Inject 8 mLs (80 mg total) into the vein 2 (two) times daily. Replaces: furosemide 80 MG tablet   insulin aspart 100 UNIT/ML injection Commonly known as: novoLOG Inject 0-9 Units into the skin 3 (three) times daily with meals.   levothyroxine 25 MCG tablet Commonly known as: SYNTHROID Take 1 tablet (25 mcg total) by mouth daily before breakfast.   losartan 25 MG tablet Commonly known as: COZAAR Take 0.5 tablets (12.5 mg total) by mouth daily.   Melatonin 3 MG Tabs Take 1 tablet (3 mg  total) by mouth at bedtime as needed (sleep). Replaces: Melatonin 10 MG Caps   potassium chloride SA 20 MEQ tablet Commonly known as: KLOR-CON Take 1 tablet (20 mEq total) by mouth 2 (two) times daily. What changed: additional instructions   spironolactone 25 MG tablet Commonly known as: ALDACTONE Take 1 tablet (25 mg total) by mouth daily. Start taking on: May 03, 2019   Vitamin D3 125 MCG (5000 UT) Caps Take 5,000 Units by mouth daily.       Disposition   The patient will be discharged in stable condition to [facility]  Discharge Instructions    Diet - low sodium heart healthy   Complete by: As directed    Increase activity slowly   Complete by: As directed          Duration of Discharge Encounter: Greater than 35 minutes   Signed, Amy Clegg NP-C  05/02/2019, 3:11 PM   Patient seen and examined with the above-signed Advanced Practice Provider and/or Housestaff. I personally reviewed laboratory data, imaging studies and relevant notes. I independently examined the patient and formulated the important aspects of the plan. I have edited the note to reflect any of my changes or salient points. I have personally discussed the plan with the patient and/or family.  I remain concerned about overall deterioration of her HF with recent new-onset AF, VT and difficulty maintaining volume status.   I have d/w Drs. Chang and [place] at [facility] for further treatment of her HF and to complete transplant w/u. Suspect she may need repeat DC-CV and possible inotrope support. She is Blood Type O. If not candidate for transplant listing we would be happy to accept her back for VAD work-up. Appreciate [facility]'s care for Ms. Keeler.  Glori Bickers, MD  4:02 PM

## 2019-05-03 ENCOUNTER — Encounter: Payer: Medicare Other | Admitting: Student

## 2019-05-03 MED ORDER — ALLOPURINOL 100 MG PO TABS
200.00 | ORAL_TABLET | ORAL | Status: DC
Start: 2019-06-02 — End: 2019-05-03

## 2019-05-03 MED ORDER — DIGOXIN 125 MCG PO TABS
62.50 | ORAL_TABLET | ORAL | Status: DC
Start: 2019-05-03 — End: 2019-05-03

## 2019-05-03 MED ORDER — DEXTROSE 50 % IV SOLN
12.50 | INTRAVENOUS | Status: DC
Start: ? — End: 2019-05-03

## 2019-05-03 MED ORDER — LEVOTHYROXINE SODIUM 25 MCG PO TABS
25.00 | ORAL_TABLET | ORAL | Status: DC
Start: 2019-06-10 — End: 2019-05-03

## 2019-05-03 MED ORDER — AMIODARONE HCL IN DEXTROSE 360-4.14 MG/200ML-% IV SOLN
0.50 | INTRAVENOUS | Status: DC
Start: ? — End: 2019-05-03

## 2019-05-03 MED ORDER — GENERIC EXTERNAL MEDICATION
10.00 | Status: DC
Start: ? — End: 2019-05-03

## 2019-05-03 MED ORDER — SPIRONOLACTONE 25 MG PO TABS
25.00 | ORAL_TABLET | ORAL | Status: DC
Start: 2019-06-10 — End: 2019-05-03

## 2019-05-03 MED ORDER — INSULIN LISPRO 100 UNIT/ML ~~LOC~~ SOLN
0.00 | SUBCUTANEOUS | Status: DC
Start: 2019-05-03 — End: 2019-05-03

## 2019-05-03 MED ORDER — ATORVASTATIN CALCIUM 80 MG PO TABS
80.00 | ORAL_TABLET | ORAL | Status: DC
Start: 2019-06-10 — End: 2019-05-03

## 2019-05-03 MED ORDER — SENNOSIDES 8.6 MG PO TABS
1.00 | ORAL_TABLET | ORAL | Status: DC
Start: 2019-05-10 — End: 2019-05-03

## 2019-05-03 MED ORDER — MELATONIN 3 MG PO TABS
6.00 | ORAL_TABLET | ORAL | Status: DC
Start: 2019-06-09 — End: 2019-05-03

## 2019-05-03 MED ORDER — POLYETHYLENE GLYCOL 3350 17 GM/SCOOP PO POWD
17.00 | ORAL | Status: DC
Start: 2019-05-03 — End: 2019-05-03

## 2019-05-03 MED ORDER — APIXABAN 5 MG PO TABS
5.00 | ORAL_TABLET | ORAL | Status: DC
Start: 2019-05-03 — End: 2019-05-03

## 2019-05-03 MED ORDER — LOSARTAN POTASSIUM 25 MG PO TABS
12.50 | ORAL_TABLET | ORAL | Status: DC
Start: 2019-05-11 — End: 2019-05-03

## 2019-05-06 DIAGNOSIS — G4739 Other sleep apnea: Secondary | ICD-10-CM | POA: Diagnosis not present

## 2019-05-10 MED ORDER — HYDROCORTISONE 1 % EX CREA
TOPICAL_CREAM | CUTANEOUS | Status: DC
Start: 2019-06-09 — End: 2019-05-10

## 2019-05-10 MED ORDER — POLYETHYLENE GLYCOL 3350 17 GM/SCOOP PO POWD
17.00 | ORAL | Status: DC
Start: ? — End: 2019-05-10

## 2019-05-10 MED ORDER — ACETAMINOPHEN 325 MG PO TABS
650.00 | ORAL_TABLET | ORAL | Status: DC
Start: ? — End: 2019-05-10

## 2019-05-10 MED ORDER — HEPARIN SOD (PORCINE) IN D5W 100 UNIT/ML IV SOLN
13.00 | INTRAVENOUS | Status: DC
Start: ? — End: 2019-05-10

## 2019-05-10 MED ORDER — BUMETANIDE 0.25 MG/ML IJ SOLN
4.00 | INTRAMUSCULAR | Status: DC
Start: 2019-05-10 — End: 2019-05-10

## 2019-05-10 MED ORDER — HEPARIN SODIUM (PORCINE) 1000 UNIT/ML IJ SOLN
5000.00 | INTRAMUSCULAR | Status: DC
Start: ? — End: 2019-05-10

## 2019-05-10 MED ORDER — CHOLECALCIFEROL 25 MCG (1000 UT) PO TABS
50.00 | ORAL_TABLET | ORAL | Status: DC
Start: 2019-06-10 — End: 2019-05-10

## 2019-05-10 MED ORDER — DIGOXIN 125 MCG PO TABS
125.00 | ORAL_TABLET | ORAL | Status: DC
Start: 2019-05-18 — End: 2019-05-10

## 2019-05-10 MED ORDER — MAGNESIUM OXIDE 400 MG PO TABS
400.00 | ORAL_TABLET | ORAL | Status: DC
Start: 2019-06-02 — End: 2019-05-10

## 2019-05-10 MED ORDER — POTASSIUM CHLORIDE CRYS ER 10 MEQ PO TBCR
40.00 | EXTENDED_RELEASE_TABLET | ORAL | Status: DC
Start: 2019-05-11 — End: 2019-05-10

## 2019-05-10 MED ORDER — INSULIN LISPRO 100 UNIT/ML ~~LOC~~ SOLN
3.00 | SUBCUTANEOUS | Status: DC
Start: 2019-05-10 — End: 2019-05-10

## 2019-05-10 MED ORDER — INSULIN LISPRO 100 UNIT/ML ~~LOC~~ SOLN
0.00 | SUBCUTANEOUS | Status: DC
Start: 2019-05-17 — End: 2019-05-10

## 2019-05-10 MED ORDER — MILRINONE LACTATE IN DEXTROSE 40-5 MG/200ML-% IV SOLN
0.13 | INTRAVENOUS | Status: DC
Start: ? — End: 2019-05-10

## 2019-05-10 MED ORDER — GENERIC EXTERNAL MEDICATION
Status: DC
Start: ? — End: 2019-05-10

## 2019-05-13 ENCOUNTER — Telehealth (HOSPITAL_COMMUNITY): Payer: Self-pay | Admitting: Internal Medicine

## 2019-05-13 MED ORDER — MILRINONE LACTATE IN DEXTROSE 40-5 MG/200ML-% IV SOLN
0.30 | INTRAVENOUS | Status: DC
Start: ? — End: 2019-05-13

## 2019-05-13 MED ORDER — HEPARIN SOD (PORCINE) IN D5W 100 UNIT/ML IV SOLN
13.00 | INTRAVENOUS | Status: DC
Start: ? — End: 2019-05-13

## 2019-05-13 MED ORDER — HEPARIN SODIUM (PORCINE) 1000 UNIT/ML IJ SOLN
5000.00 | INTRAMUSCULAR | Status: DC
Start: ? — End: 2019-05-13

## 2019-05-13 MED ORDER — SENNOSIDES 8.6 MG PO TABS
1.00 | ORAL_TABLET | ORAL | Status: DC
Start: 2019-05-17 — End: 2019-05-13

## 2019-05-13 MED ORDER — AMIODARONE HCL 200 MG PO TABS
400.00 | ORAL_TABLET | ORAL | Status: DC
Start: 2019-06-10 — End: 2019-05-13

## 2019-05-13 MED ORDER — LUBRIDERM DAILY MOISTURE EX LOTN
TOPICAL_LOTION | CUTANEOUS | Status: DC
Start: ? — End: 2019-05-13

## 2019-05-13 MED ORDER — INSULIN GLARGINE 100 UNIT/ML ~~LOC~~ SOLN
8.00 | SUBCUTANEOUS | Status: DC
Start: 2019-05-18 — End: 2019-05-13

## 2019-05-13 MED ORDER — BUMETANIDE 0.25 MG/ML IJ SOLN
4.00 | INTRAMUSCULAR | Status: DC
Start: 2019-05-17 — End: 2019-05-13

## 2019-05-13 MED ORDER — INSULIN LISPRO 100 UNIT/ML ~~LOC~~ SOLN
5.00 | SUBCUTANEOUS | Status: DC
Start: 2019-05-13 — End: 2019-05-13

## 2019-05-13 NOTE — Telephone Encounter (Signed)
Patient cancelled her Echocardiogram due to she has been in the hospital for 3 weeks and had performed there. Order has been cancelled.

## 2019-05-16 ENCOUNTER — Encounter (HOSPITAL_COMMUNITY): Payer: Medicare Other | Admitting: Internal Medicine

## 2019-05-16 ENCOUNTER — Other Ambulatory Visit (HOSPITAL_COMMUNITY): Payer: Medicare Other

## 2019-05-17 MED ORDER — INSULIN LISPRO 100 UNIT/ML ~~LOC~~ SOLN
4.00 | SUBCUTANEOUS | Status: DC
Start: 2019-05-17 — End: 2019-05-17

## 2019-05-17 MED ORDER — BISACODYL 5 MG PO TBEC
10.00 | DELAYED_RELEASE_TABLET | ORAL | Status: DC
Start: ? — End: 2019-05-17

## 2019-05-26 ENCOUNTER — Encounter: Payer: Medicare Other | Admitting: Internal Medicine

## 2019-05-31 ENCOUNTER — Other Ambulatory Visit: Payer: Self-pay

## 2019-06-02 MED ORDER — LIDOCAINE 5 % EX PTCH
2.00 | MEDICATED_PATCH | CUTANEOUS | Status: DC
Start: 2019-06-10 — End: 2019-06-02

## 2019-06-02 MED ORDER — POLYETHYLENE GLYCOL 3350 17 GM/SCOOP PO POWD
17.00 | ORAL | Status: DC
Start: 2019-06-03 — End: 2019-06-02

## 2019-06-02 MED ORDER — GABAPENTIN 100 MG PO CAPS
300.00 | ORAL_CAPSULE | ORAL | Status: DC
Start: 2019-06-09 — End: 2019-06-02

## 2019-06-02 MED ORDER — INSULIN NPH (HUMAN) (ISOPHANE) 100 UNIT/ML ~~LOC~~ SUSP
12.00 | SUBCUTANEOUS | Status: DC
Start: 2019-06-10 — End: 2019-06-02

## 2019-06-02 MED ORDER — GUAIFENESIN 100 MG/5ML PO SYRP
200.00 | ORAL_SOLUTION | ORAL | Status: DC
Start: ? — End: 2019-06-02

## 2019-06-02 MED ORDER — CALCIUM CARBONATE 1250 (500 CA) MG PO CHEW
CHEWABLE_TABLET | ORAL | Status: DC
Start: ? — End: 2019-06-02

## 2019-06-02 MED ORDER — DIGOXIN 125 MCG PO TABS
125.00 | ORAL_TABLET | ORAL | Status: DC
Start: 2019-06-03 — End: 2019-06-02

## 2019-06-02 MED ORDER — ACETAMINOPHEN 325 MG PO TABS
650.00 | ORAL_TABLET | ORAL | Status: DC
Start: 2019-06-02 — End: 2019-06-02

## 2019-06-02 MED ORDER — IPRATROPIUM-ALBUTEROL 0.5-2.5 (3) MG/3ML IN SOLN
3.00 | RESPIRATORY_TRACT | Status: DC
Start: ? — End: 2019-06-02

## 2019-06-02 MED ORDER — INSULIN LISPRO 100 UNIT/ML ~~LOC~~ SOLN
14.00 | SUBCUTANEOUS | Status: DC
Start: 2019-06-09 — End: 2019-06-02

## 2019-06-02 MED ORDER — PHENOL 1.4 % MT LIQD
2.00 | OROMUCOSAL | Status: DC
Start: ? — End: 2019-06-02

## 2019-06-02 MED ORDER — SENNOSIDES 8.6 MG PO TABS
1.00 | ORAL_TABLET | ORAL | Status: DC
Start: ? — End: 2019-06-02

## 2019-06-02 MED ORDER — OXYCODONE HCL 5 MG PO TABS
5.00 | ORAL_TABLET | ORAL | Status: DC
Start: ? — End: 2019-06-02

## 2019-06-02 MED ORDER — NALOXONE HCL 4 MG/10ML IJ SOLN
0.40 | INTRAMUSCULAR | Status: DC
Start: ? — End: 2019-06-02

## 2019-06-02 MED ORDER — INSULIN LISPRO 100 UNIT/ML ~~LOC~~ SOLN
0.00 | SUBCUTANEOUS | Status: DC
Start: 2019-06-09 — End: 2019-06-02

## 2019-06-02 MED ORDER — OXYCODONE HCL 5 MG PO TABS
10.00 | ORAL_TABLET | ORAL | Status: DC
Start: ? — End: 2019-06-02

## 2019-06-02 MED ORDER — INSULIN NPH (HUMAN) (ISOPHANE) 100 UNIT/ML ~~LOC~~ SUSP
10.00 | SUBCUTANEOUS | Status: DC
Start: 2019-06-09 — End: 2019-06-02

## 2019-06-09 MED ORDER — EMPAGLIFLOZIN 10 MG PO TABS
10.00 | ORAL_TABLET | ORAL | Status: DC
Start: 2019-06-10 — End: 2019-06-09

## 2019-06-09 MED ORDER — APIXABAN 5 MG PO TABS
5.00 | ORAL_TABLET | ORAL | Status: DC
Start: 2019-06-09 — End: 2019-06-09

## 2019-06-09 MED ORDER — CEPHALEXIN 500 MG PO CAPS
500.00 | ORAL_CAPSULE | ORAL | Status: DC
Start: 2019-06-09 — End: 2019-06-09

## 2019-06-09 MED ORDER — DIGOXIN 125 MCG PO TABS
125.00 | ORAL_TABLET | ORAL | Status: DC
Start: 2019-06-10 — End: 2019-06-09

## 2019-06-09 MED ORDER — HEPARIN SODIUM LOCK FLUSH 100 UNIT/ML IV SOLN
200.00 | INTRAVENOUS | Status: DC
Start: 2019-06-10 — End: 2019-06-09

## 2019-06-09 MED ORDER — ALLOPURINOL 100 MG PO TABS
100.00 | ORAL_TABLET | ORAL | Status: DC
Start: 2019-06-09 — End: 2019-06-09

## 2019-06-09 MED ORDER — ACETAMINOPHEN 325 MG PO TABS
650.00 | ORAL_TABLET | ORAL | Status: DC
Start: ? — End: 2019-06-09

## 2019-06-09 MED ORDER — LACTULOSE 10 GM/15ML PO SOLN
10.00 | ORAL | Status: DC
Start: ? — End: 2019-06-09

## 2019-06-09 MED ORDER — TORSEMIDE 20 MG PO TABS
40.00 | ORAL_TABLET | ORAL | Status: DC
Start: 2019-06-10 — End: 2019-06-09

## 2019-06-09 MED ORDER — POLYETHYLENE GLYCOL 3350 17 GM/SCOOP PO POWD
17.00 | ORAL | Status: DC
Start: 2019-06-09 — End: 2019-06-09

## 2019-06-09 MED ORDER — EZETIMIBE 10 MG PO TABS
10.00 | ORAL_TABLET | ORAL | Status: DC
Start: 2019-06-09 — End: 2019-06-09

## 2019-06-09 MED ORDER — DICLOFENAC SODIUM 1 % EX GEL
2.00 | CUTANEOUS | Status: DC
Start: 2019-06-09 — End: 2019-06-09

## 2019-06-15 ENCOUNTER — Ambulatory Visit: Payer: Medicare Other

## 2019-06-16 DIAGNOSIS — I509 Heart failure, unspecified: Secondary | ICD-10-CM | POA: Diagnosis not present

## 2019-06-20 DIAGNOSIS — I509 Heart failure, unspecified: Secondary | ICD-10-CM | POA: Diagnosis not present

## 2019-06-21 DIAGNOSIS — I480 Paroxysmal atrial fibrillation: Secondary | ICD-10-CM | POA: Diagnosis not present

## 2019-06-21 DIAGNOSIS — E119 Type 2 diabetes mellitus without complications: Secondary | ICD-10-CM | POA: Diagnosis not present

## 2019-06-21 DIAGNOSIS — I5022 Chronic systolic (congestive) heart failure: Secondary | ICD-10-CM | POA: Diagnosis not present

## 2019-06-21 DIAGNOSIS — Z95810 Presence of automatic (implantable) cardiac defibrillator: Secondary | ICD-10-CM | POA: Diagnosis not present

## 2019-06-21 DIAGNOSIS — I2510 Atherosclerotic heart disease of native coronary artery without angina pectoris: Secondary | ICD-10-CM | POA: Diagnosis not present

## 2019-06-21 DIAGNOSIS — Z951 Presence of aortocoronary bypass graft: Secondary | ICD-10-CM | POA: Diagnosis not present

## 2019-06-21 DIAGNOSIS — C3491 Malignant neoplasm of unspecified part of right bronchus or lung: Secondary | ICD-10-CM | POA: Diagnosis not present

## 2019-06-22 DIAGNOSIS — I509 Heart failure, unspecified: Secondary | ICD-10-CM | POA: Diagnosis not present

## 2019-06-23 ENCOUNTER — Ambulatory Visit (INDEPENDENT_AMBULATORY_CARE_PROVIDER_SITE_OTHER): Payer: Medicare Other | Admitting: *Deleted

## 2019-06-23 DIAGNOSIS — I447 Left bundle-branch block, unspecified: Secondary | ICD-10-CM

## 2019-06-23 LAB — CUP PACEART REMOTE DEVICE CHECK
Date Time Interrogation Session: 20210422165128
Implantable Lead Implant Date: 20111012
Implantable Lead Implant Date: 20151002
Implantable Lead Implant Date: 20151002
Implantable Lead Location: 753858
Implantable Lead Location: 753859
Implantable Lead Location: 753860
Implantable Lead Model: 4396
Implantable Lead Model: 5076
Implantable Lead Model: 6947
Implantable Pulse Generator Implant Date: 20190913
Lead Channel Setting Pacing Amplitude: 2 V
Lead Channel Setting Pacing Amplitude: 2.75 V
Lead Channel Setting Pacing Amplitude: 3.5 V
Lead Channel Setting Pacing Pulse Width: 0.4 ms
Lead Channel Setting Pacing Pulse Width: 1.5 ms
Lead Channel Setting Sensing Sensitivity: 0.45 mV

## 2019-06-24 ENCOUNTER — Telehealth: Payer: Self-pay | Admitting: Internal Medicine

## 2019-06-24 DIAGNOSIS — R4020 Unspecified coma: Secondary | ICD-10-CM | POA: Diagnosis not present

## 2019-06-24 DIAGNOSIS — I499 Cardiac arrhythmia, unspecified: Secondary | ICD-10-CM | POA: Diagnosis not present

## 2019-06-24 DIAGNOSIS — R404 Transient alteration of awareness: Secondary | ICD-10-CM | POA: Diagnosis not present

## 2019-06-24 DIAGNOSIS — R0689 Other abnormalities of breathing: Secondary | ICD-10-CM | POA: Diagnosis not present

## 2019-06-28 ENCOUNTER — Encounter (HOSPITAL_COMMUNITY): Payer: Self-pay | Admitting: *Deleted

## 2019-06-28 NOTE — Progress Notes (Signed)
Received pt's death certificate from [facility], form completed and signed by Dr Haroldine Laws, funeral home is aware to pick up

## 2019-07-02 DIAGNOSIS — 419620001 Death: Secondary | SNOMED CT | POA: Diagnosis not present

## 2019-07-02 NOTE — Telephone Encounter (Signed)
Received a call at 8:54pm from [employer] that [name] appeared to have a VF event around 5:20pm that was treated with one ICD shock and persisted. Active, remote interrogation of her device now shows pacing without capture, concerning for patient death. I called the patient's phone with no answer. I called 911 who stated that EMS had been out earlier. I described that if she was alive she needed to come to the ER immediately. They stated they could not give me more information but that EMS would call me with more information. I have not been called by EMS. Based on device interrogation, suspect she has passed away. Will update Dr. Haroldine Laws.  Alric Quan, MD Cardiology [place]

## 2019-07-02 NOTE — Progress Notes (Signed)
ICD Remote  

## 2019-07-02 DEATH — deceased

## 2019-07-04 ENCOUNTER — Ambulatory Visit: Payer: Medicare Other

## 2019-07-04 ENCOUNTER — Encounter (HOSPITAL_COMMUNITY): Payer: Medicare Other

## 2020-08-06 IMAGING — DX DG CHEST 1V PORT
1 series · 1 of 1 positions shown · non-contrast
Comparison: Prior radiograph from 12/11/2017.

CLINICAL DATA: Initial evaluation for acute weakness, near syncope.

EXAM:
PORTABLE CHEST 1 VIEW

[chest ap]
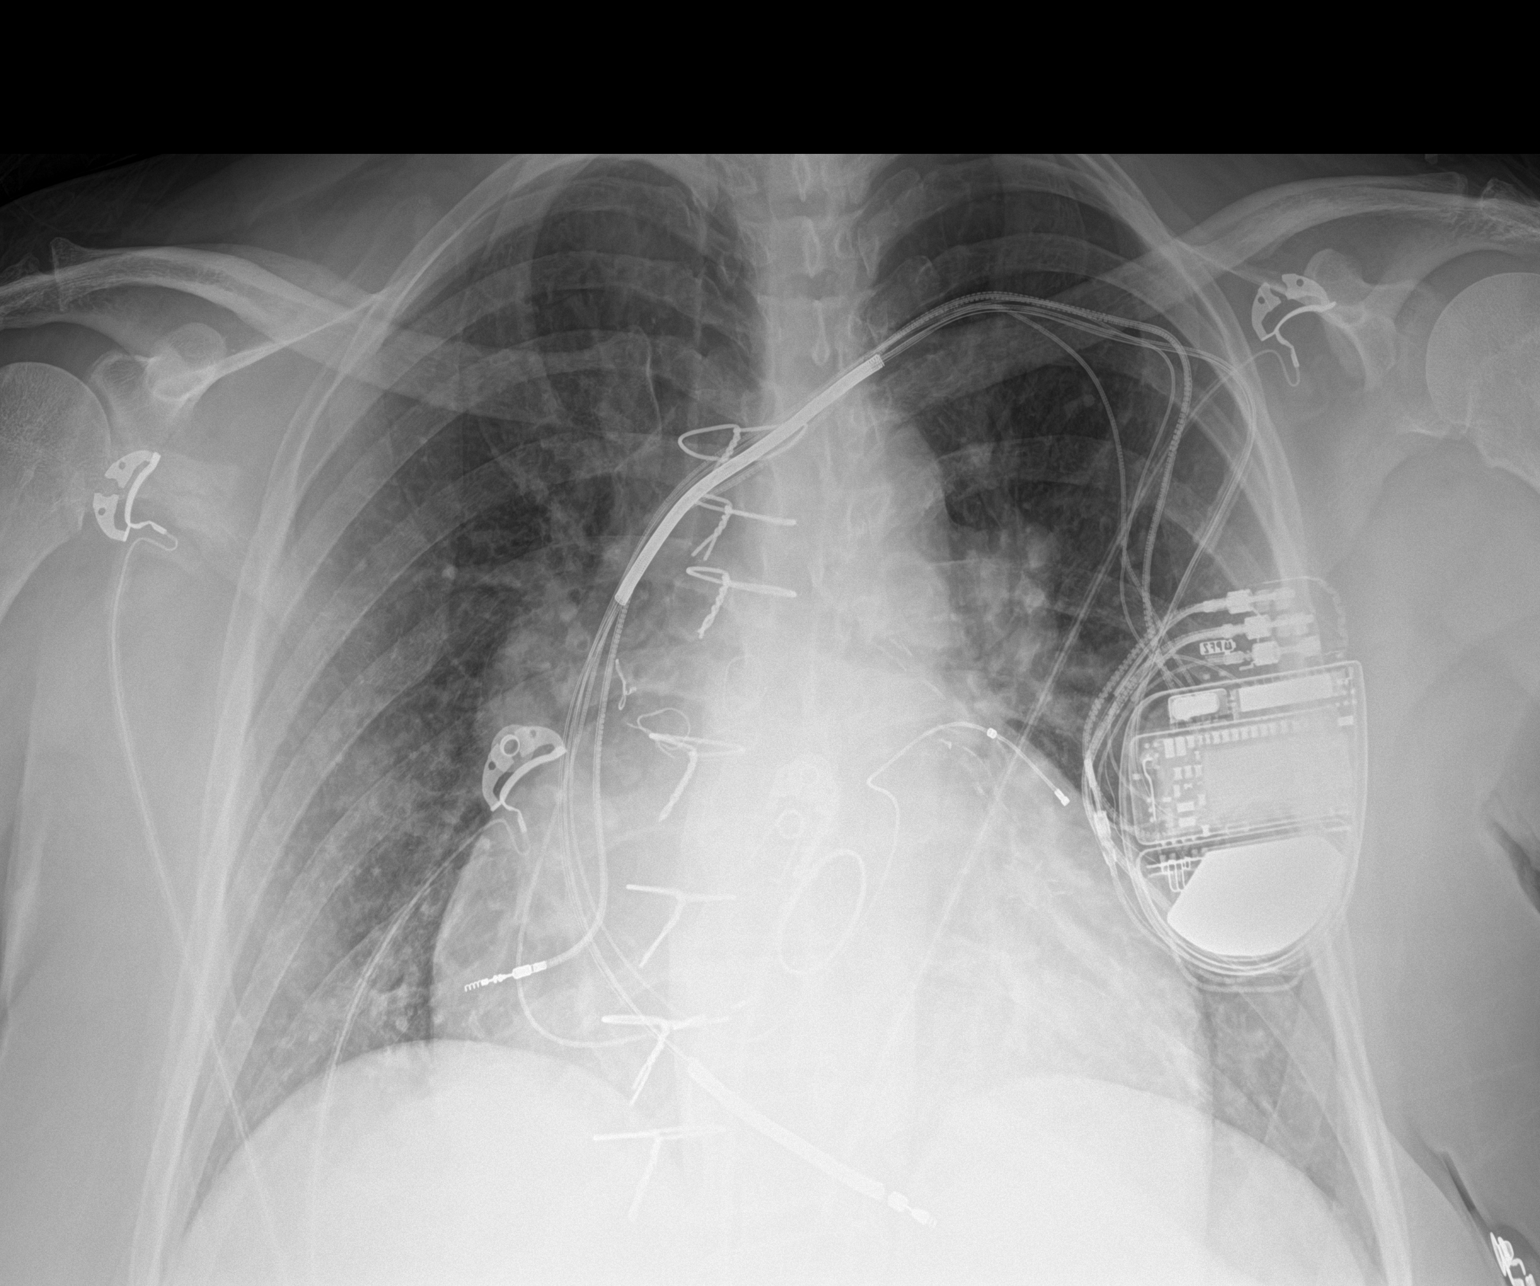

[1 of 1 positions shown; findings below may reference images not displayed]

FINDINGS: Left-sided transvenous pacemaker/AICD in place. Median sternotomy
wires with underlying CABG markers and valvular prosthesis noted,
stable. Cardiomegaly grossly stable allowing for differences in
technique. Mediastinal silhouette within normal limits.

Lungs are hypoinflated. Perihilar vascular and interstitial
prominence suggest mild pulmonary interstitial congestion. No
visible pleural effusion. No focal infiltrates or consolidative
opacity. No pneumothorax.

No acute osseous finding.
IMPRESSION: 1. Cardiomegaly with mild diffuse pulmonary interstitial
congestion/edema.
2. No other active cardiopulmonary disease.
